# Patient Record
Sex: Male | Born: 1935
Health system: Southern US, Community
[De-identification: ages and names within clinical notes are randomized; demographics above are authoritative.]

## PROBLEM LIST (undated history)

## (undated) DIAGNOSIS — C801 Malignant (primary) neoplasm, unspecified: Secondary | ICD-10-CM

## (undated) DIAGNOSIS — A319 Mycobacterial infection, unspecified: Secondary | ICD-10-CM

## (undated) DIAGNOSIS — R112 Nausea with vomiting, unspecified: Secondary | ICD-10-CM

## (undated) DIAGNOSIS — M1990 Unspecified osteoarthritis, unspecified site: Secondary | ICD-10-CM

## (undated) DIAGNOSIS — K219 Gastro-esophageal reflux disease without esophagitis: Secondary | ICD-10-CM

## (undated) DIAGNOSIS — B029 Zoster without complications: Secondary | ICD-10-CM

## (undated) DIAGNOSIS — Z9889 Other specified postprocedural states: Secondary | ICD-10-CM

## (undated) DIAGNOSIS — A310 Pulmonary mycobacterial infection: Secondary | ICD-10-CM

## (undated) DIAGNOSIS — E039 Hypothyroidism, unspecified: Secondary | ICD-10-CM

## (undated) DIAGNOSIS — Z973 Presence of spectacles and contact lenses: Secondary | ICD-10-CM

## (undated) DIAGNOSIS — I5022 Chronic systolic (congestive) heart failure: Secondary | ICD-10-CM

## (undated) DIAGNOSIS — R918 Other nonspecific abnormal finding of lung field: Secondary | ICD-10-CM

## (undated) HISTORY — DX: Malignant (primary) neoplasm, unspecified: C80.1

## (undated) HISTORY — PX: COLONOSCOPY: SHX174

## (undated) HISTORY — PX: CATARACT EXTRACTION W/ INTRAOCULAR LENS  IMPLANT, BILATERAL: SHX1307

## (undated) HISTORY — DX: Other nonspecific abnormal finding of lung field: R91.8

## (undated) HISTORY — DX: Mycobacterial infection, unspecified: A31.9

## (undated) HISTORY — DX: Chronic systolic (congestive) heart failure: I50.22

## (undated) HISTORY — PX: APPENDECTOMY: SHX54

## (undated) HISTORY — PX: INGUINAL HERNIA REPAIR: SUR1180

---

## 2001-11-15 HISTORY — PX: THYROIDECTOMY: SHX17

## 2002-09-05 ENCOUNTER — Encounter: Admission: RE | Admit: 2002-09-05 | Discharge: 2002-09-05 | Payer: Self-pay | Admitting: General Surgery

## 2002-09-05 ENCOUNTER — Encounter: Payer: Self-pay | Admitting: General Surgery

## 2002-09-06 ENCOUNTER — Ambulatory Visit (HOSPITAL_BASED_OUTPATIENT_CLINIC_OR_DEPARTMENT_OTHER): Admission: RE | Admit: 2002-09-06 | Discharge: 2002-09-06 | Payer: Self-pay | Admitting: General Surgery

## 2002-10-01 ENCOUNTER — Encounter: Payer: Self-pay | Admitting: General Surgery

## 2002-10-01 ENCOUNTER — Encounter: Admission: RE | Admit: 2002-10-01 | Discharge: 2002-10-01 | Payer: Self-pay | Admitting: General Surgery

## 2002-10-15 ENCOUNTER — Encounter: Payer: Self-pay | Admitting: General Surgery

## 2002-10-15 ENCOUNTER — Ambulatory Visit (HOSPITAL_COMMUNITY): Admission: RE | Admit: 2002-10-15 | Discharge: 2002-10-15 | Payer: Self-pay | Admitting: General Surgery

## 2002-10-17 ENCOUNTER — Ambulatory Visit (HOSPITAL_COMMUNITY): Admission: RE | Admit: 2002-10-17 | Discharge: 2002-10-17 | Payer: Self-pay | Admitting: General Surgery

## 2002-10-17 ENCOUNTER — Encounter: Payer: Self-pay | Admitting: General Surgery

## 2002-11-19 ENCOUNTER — Inpatient Hospital Stay (HOSPITAL_COMMUNITY): Admission: RE | Admit: 2002-11-19 | Discharge: 2002-11-21 | Payer: Self-pay | Admitting: General Surgery

## 2002-12-31 ENCOUNTER — Encounter (HOSPITAL_COMMUNITY): Admission: RE | Admit: 2002-12-31 | Discharge: 2003-03-31 | Payer: Self-pay | Admitting: Endocrinology

## 2003-01-03 ENCOUNTER — Encounter: Payer: Self-pay | Admitting: Endocrinology

## 2003-01-10 ENCOUNTER — Encounter: Payer: Self-pay | Admitting: Endocrinology

## 2005-02-08 ENCOUNTER — Ambulatory Visit (HOSPITAL_COMMUNITY): Admission: RE | Admit: 2005-02-08 | Discharge: 2005-02-08 | Payer: Self-pay | Admitting: *Deleted

## 2014-04-05 ENCOUNTER — Other Ambulatory Visit: Payer: Self-pay | Admitting: Orthopedic Surgery

## 2014-04-05 DIAGNOSIS — R229 Localized swelling, mass and lump, unspecified: Principal | ICD-10-CM

## 2014-04-05 DIAGNOSIS — IMO0002 Reserved for concepts with insufficient information to code with codable children: Secondary | ICD-10-CM

## 2014-04-05 DIAGNOSIS — D492 Neoplasm of unspecified behavior of bone, soft tissue, and skin: Secondary | ICD-10-CM

## 2014-04-12 ENCOUNTER — Other Ambulatory Visit: Payer: Self-pay | Admitting: Orthopedic Surgery

## 2014-04-12 DIAGNOSIS — Z139 Encounter for screening, unspecified: Secondary | ICD-10-CM

## 2014-04-15 ENCOUNTER — Ambulatory Visit
Admission: RE | Admit: 2014-04-15 | Discharge: 2014-04-15 | Disposition: A | Discharge status: Home / Self Care | Payer: Medicare Other | Source: Ambulatory Visit | Attending: Orthopedic Surgery | Admitting: Orthopedic Surgery

## 2014-04-15 DIAGNOSIS — D492 Neoplasm of unspecified behavior of bone, soft tissue, and skin: Secondary | ICD-10-CM

## 2014-04-15 DIAGNOSIS — IMO0002 Reserved for concepts with insufficient information to code with codable children: Secondary | ICD-10-CM

## 2014-04-15 DIAGNOSIS — R229 Localized swelling, mass and lump, unspecified: Principal | ICD-10-CM

## 2014-04-15 DIAGNOSIS — Z139 Encounter for screening, unspecified: Secondary | ICD-10-CM

## 2014-04-15 MED ORDER — GADOBENATE DIMEGLUMINE 529 MG/ML IV SOLN
15.0000 mL | Freq: Once | INTRAVENOUS | Status: AC | PRN
  Administered 2014-04-15: 15 mL via INTRAVENOUS

## 2014-08-19 ENCOUNTER — Other Ambulatory Visit: Payer: Self-pay | Admitting: Orthopedic Surgery

## 2014-08-22 ENCOUNTER — Encounter (HOSPITAL_BASED_OUTPATIENT_CLINIC_OR_DEPARTMENT_OTHER): Payer: Self-pay | Admitting: *Deleted

## 2014-08-22 NOTE — Progress Notes (Signed)
No labs needed

## 2014-08-27 ENCOUNTER — Encounter (HOSPITAL_BASED_OUTPATIENT_CLINIC_OR_DEPARTMENT_OTHER): Payer: Medicare Other | Admitting: Anesthesiology

## 2014-08-27 ENCOUNTER — Ambulatory Visit (HOSPITAL_BASED_OUTPATIENT_CLINIC_OR_DEPARTMENT_OTHER): Payer: Medicare Other | Admitting: Anesthesiology

## 2014-08-27 ENCOUNTER — Encounter (HOSPITAL_BASED_OUTPATIENT_CLINIC_OR_DEPARTMENT_OTHER)
Admission: RE | Disposition: A | Discharge status: Home / Self Care | Payer: Self-pay | Source: Ambulatory Visit | Attending: Orthopedic Surgery

## 2014-08-27 ENCOUNTER — Encounter (HOSPITAL_BASED_OUTPATIENT_CLINIC_OR_DEPARTMENT_OTHER): Payer: Self-pay | Admitting: *Deleted

## 2014-08-27 ENCOUNTER — Ambulatory Visit (HOSPITAL_BASED_OUTPATIENT_CLINIC_OR_DEPARTMENT_OTHER)
Admission: RE | Admit: 2014-08-27 | Discharge: 2014-08-27 | Disposition: A | Discharge status: Home / Self Care | Payer: Medicare Other | Source: Ambulatory Visit | Attending: Orthopedic Surgery | Admitting: Orthopedic Surgery

## 2014-08-27 DIAGNOSIS — D2111 Benign neoplasm of connective and other soft tissue of right upper limb, including shoulder: Secondary | ICD-10-CM | POA: Insufficient documentation

## 2014-08-27 DIAGNOSIS — E039 Hypothyroidism, unspecified: Secondary | ICD-10-CM | POA: Insufficient documentation

## 2014-08-27 DIAGNOSIS — M1990 Unspecified osteoarthritis, unspecified site: Secondary | ICD-10-CM | POA: Diagnosis not present

## 2014-08-27 DIAGNOSIS — R229 Localized swelling, mass and lump, unspecified: Secondary | ICD-10-CM | POA: Diagnosis present

## 2014-08-27 HISTORY — DX: Other specified postprocedural states: Z98.890

## 2014-08-27 HISTORY — PX: MASS EXCISION: SHX2000

## 2014-08-27 HISTORY — DX: Unspecified osteoarthritis, unspecified site: M19.90

## 2014-08-27 HISTORY — DX: Hypothyroidism, unspecified: E03.9

## 2014-08-27 HISTORY — DX: Other specified postprocedural states: R11.2

## 2014-08-27 HISTORY — DX: Presence of spectacles and contact lenses: Z97.3

## 2014-08-27 LAB — POCT HEMOGLOBIN-HEMACUE: HEMOGLOBIN: 14.1 g/dL (ref 13.0–17.0)

## 2014-08-27 SURGERY — EXCISION MASS
Anesthesia: Regional | Site: Hand | Laterality: Right

## 2014-08-27 MED ORDER — BUPIVACAINE HCL (PF) 0.25 % IJ SOLN
INTRAMUSCULAR | Status: AC
  Filled 2014-08-27: qty 30

## 2014-08-27 MED ORDER — OXYCODONE HCL 5 MG PO TABS
5.0000 mg | ORAL_TABLET | Freq: Once | ORAL | Status: AC | PRN
  Administered 2014-08-27: 5 mg via ORAL

## 2014-08-27 MED ORDER — CEFAZOLIN SODIUM-DEXTROSE 2-3 GM-% IV SOLR
2.0000 g | INTRAVENOUS | Status: AC
  Administered 2014-08-27: 2 g via INTRAVENOUS

## 2014-08-27 MED ORDER — EPHEDRINE SULFATE 50 MG/ML IJ SOLN
INTRAMUSCULAR | Status: DC | PRN
  Administered 2014-08-27: 10 mg via INTRAVENOUS

## 2014-08-27 MED ORDER — MIDAZOLAM HCL 2 MG/2ML IJ SOLN
INTRAMUSCULAR | Status: AC
  Filled 2014-08-27: qty 2

## 2014-08-27 MED ORDER — DEXAMETHASONE SODIUM PHOSPHATE 10 MG/ML IJ SOLN
INTRAMUSCULAR | Status: DC | PRN
  Administered 2014-08-27: 4 mg via INTRAVENOUS

## 2014-08-27 MED ORDER — FENTANYL CITRATE 0.05 MG/ML IJ SOLN
INTRAMUSCULAR | Status: DC | PRN
  Administered 2014-08-27: 25 ug via INTRAVENOUS

## 2014-08-27 MED ORDER — OXYCODONE HCL 5 MG PO TABS
ORAL_TABLET | ORAL | Status: AC
  Filled 2014-08-27: qty 1

## 2014-08-27 MED ORDER — PROPOFOL 10 MG/ML IV BOLUS
INTRAVENOUS | Status: DC | PRN
  Administered 2014-08-27 (×2): 50 mg via INTRAVENOUS
  Administered 2014-08-27: 200 mg via INTRAVENOUS

## 2014-08-27 MED ORDER — CEFAZOLIN SODIUM-DEXTROSE 2-3 GM-% IV SOLR
INTRAVENOUS | Status: AC
  Filled 2014-08-27: qty 50

## 2014-08-27 MED ORDER — FENTANYL CITRATE 0.05 MG/ML IJ SOLN
INTRAMUSCULAR | Status: AC
  Filled 2014-08-27: qty 2

## 2014-08-27 MED ORDER — ONDANSETRON HCL 4 MG/2ML IJ SOLN
INTRAMUSCULAR | Status: DC | PRN
  Administered 2014-08-27: 4 mg via INTRAVENOUS

## 2014-08-27 MED ORDER — OXYCODONE HCL 5 MG/5ML PO SOLN: 5.0000 mg | Freq: Once | ORAL | Status: AC | PRN

## 2014-08-27 MED ORDER — FENTANYL CITRATE 0.05 MG/ML IJ SOLN
50.0000 ug | INTRAMUSCULAR | Status: DC | PRN
  Administered 2014-08-27: 50 ug via INTRAVENOUS

## 2014-08-27 MED ORDER — BUPIVACAINE-EPINEPHRINE (PF) 0.5% -1:200000 IJ SOLN
INTRAMUSCULAR | Status: DC | PRN
  Administered 2014-08-27: 30 mL via PERINEURAL

## 2014-08-27 MED ORDER — CHLORHEXIDINE GLUCONATE 4 % EX LIQD: 60.0000 mL | Freq: Once | CUTANEOUS | Status: DC

## 2014-08-27 MED ORDER — FENTANYL CITRATE 0.05 MG/ML IJ SOLN
INTRAMUSCULAR | Status: AC
  Filled 2014-08-27: qty 4

## 2014-08-27 MED ORDER — HYDROCODONE-ACETAMINOPHEN 5-325 MG PO TABS: ORAL_TABLET | ORAL | Status: DC

## 2014-08-27 MED ORDER — FENTANYL CITRATE 0.05 MG/ML IJ SOLN: 25.0000 ug | INTRAMUSCULAR | Status: DC | PRN

## 2014-08-27 MED ORDER — MIDAZOLAM HCL 2 MG/2ML IJ SOLN: 1.0000 mg | INTRAMUSCULAR | Status: DC | PRN

## 2014-08-27 MED ORDER — LACTATED RINGERS IV SOLN
INTRAVENOUS | Status: DC
  Administered 2014-08-27: 10 mL/h via INTRAVENOUS
  Administered 2014-08-27: 12:00:00 via INTRAVENOUS

## 2014-08-27 MED ORDER — ONDANSETRON HCL 4 MG/2ML IJ SOLN: 4.0000 mg | Freq: Four times a day (QID) | INTRAMUSCULAR | Status: DC | PRN

## 2014-08-27 SURGICAL SUPPLY — 61 items
BANDAGE COBAN STERILE 2 (GAUZE/BANDAGES/DRESSINGS) IMPLANT
BANDAGE ELASTIC 3 VELCRO ST LF (GAUZE/BANDAGES/DRESSINGS) ×3 IMPLANT
BENZOIN TINCTURE PRP APPL 2/3 (GAUZE/BANDAGES/DRESSINGS) IMPLANT
BLADE MINI RND TIP GREEN BEAV (BLADE) IMPLANT
BLADE SURG 15 STRL LF DISP TIS (BLADE) ×2 IMPLANT
BLADE SURG 15 STRL SS (BLADE) ×4
BNDG COHESIVE 1X5 TAN STRL LF (GAUZE/BANDAGES/DRESSINGS) IMPLANT
BNDG CONFORM 2 STRL LF (GAUZE/BANDAGES/DRESSINGS) IMPLANT
BNDG ELASTIC 2 VLCR STRL LF (GAUZE/BANDAGES/DRESSINGS) IMPLANT
BNDG ESMARK 4X9 LF (GAUZE/BANDAGES/DRESSINGS) ×3 IMPLANT
BNDG GAUZE 1X2.1 STRL (MISCELLANEOUS) IMPLANT
BNDG GAUZE ELAST 4 BULKY (GAUZE/BANDAGES/DRESSINGS) ×3 IMPLANT
BNDG PLASTER X FAST 3X3 WHT LF (CAST SUPPLIES) ×15 IMPLANT
CHLORAPREP W/TINT 26ML (MISCELLANEOUS) ×3 IMPLANT
CLOSURE WOUND 1/2 X4 (GAUZE/BANDAGES/DRESSINGS)
CORDS BIPOLAR (ELECTRODE) ×3 IMPLANT
COVER BACK TABLE 60X90IN (DRAPES) ×3 IMPLANT
COVER MAYO STAND STRL (DRAPES) ×3 IMPLANT
CUFF TOURNIQUET SINGLE 18IN (TOURNIQUET CUFF) ×3 IMPLANT
DRAPE EXTREMITY TIBURON (DRAPES) ×3 IMPLANT
DRAPE SURG 17X23 STRL (DRAPES) ×3 IMPLANT
GAUZE SPONGE 4X4 12PLY STRL (GAUZE/BANDAGES/DRESSINGS) ×3 IMPLANT
GAUZE XEROFORM 1X8 LF (GAUZE/BANDAGES/DRESSINGS) ×3 IMPLANT
GLOVE BIO SURGEON STRL SZ7.5 (GLOVE) ×3 IMPLANT
GLOVE BIOGEL PI IND STRL 7.0 (GLOVE) ×1 IMPLANT
GLOVE BIOGEL PI IND STRL 8 (GLOVE) ×1 IMPLANT
GLOVE BIOGEL PI IND STRL 8.5 (GLOVE) ×1 IMPLANT
GLOVE BIOGEL PI INDICATOR 7.0 (GLOVE) ×2
GLOVE BIOGEL PI INDICATOR 8 (GLOVE) ×2
GLOVE BIOGEL PI INDICATOR 8.5 (GLOVE) ×2
GLOVE ECLIPSE 6.5 STRL STRAW (GLOVE) ×3 IMPLANT
GLOVE EXAM NITRILE LRG STRL (GLOVE) ×3 IMPLANT
GLOVE SURG ORTHO 8.0 STRL STRW (GLOVE) ×3 IMPLANT
GOWN STRL REUS W/ TWL LRG LVL3 (GOWN DISPOSABLE) ×1 IMPLANT
GOWN STRL REUS W/TWL LRG LVL3 (GOWN DISPOSABLE) ×2
GOWN STRL REUS W/TWL XL LVL3 (GOWN DISPOSABLE) ×6 IMPLANT
NEEDLE HYPO 25X1 1.5 SAFETY (NEEDLE) IMPLANT
NS IRRIG 1000ML POUR BTL (IV SOLUTION) ×3 IMPLANT
PACK BASIN DAY SURGERY FS (CUSTOM PROCEDURE TRAY) ×3 IMPLANT
PAD CAST 3X4 CTTN HI CHSV (CAST SUPPLIES) ×1 IMPLANT
PAD CAST 4YDX4 CTTN HI CHSV (CAST SUPPLIES) IMPLANT
PADDING CAST ABS 4INX4YD NS (CAST SUPPLIES)
PADDING CAST ABS COTTON 4X4 ST (CAST SUPPLIES) IMPLANT
PADDING CAST COTTON 3X4 STRL (CAST SUPPLIES) ×2
PADDING CAST COTTON 4X4 STRL (CAST SUPPLIES)
SLEEVE SCD COMPRESS KNEE MED (MISCELLANEOUS) ×3 IMPLANT
SLING ARM LRG ADULT FOAM STRAP (SOFTGOODS) ×3 IMPLANT
STOCKINETTE 4X48 STRL (DRAPES) ×3 IMPLANT
STRIP CLOSURE SKIN 1/2X4 (GAUZE/BANDAGES/DRESSINGS) IMPLANT
SUT ETHILON 3 0 PS 1 (SUTURE) IMPLANT
SUT ETHILON 4 0 PS 2 18 (SUTURE) ×3 IMPLANT
SUT ETHILON 5 0 P 3 18 (SUTURE)
SUT NYLON ETHILON 5-0 P-3 1X18 (SUTURE) IMPLANT
SUT VIC AB 4-0 P2 18 (SUTURE) IMPLANT
SUT VICRYL 4-0 PS2 18IN ABS (SUTURE) ×3 IMPLANT
SWAB COLLECTION DEVICE MRSA (MISCELLANEOUS) ×3 IMPLANT
SYR BULB 3OZ (MISCELLANEOUS) ×3 IMPLANT
SYR CONTROL 10ML LL (SYRINGE) IMPLANT
TOWEL OR 17X24 6PK STRL BLUE (TOWEL DISPOSABLE) ×3 IMPLANT
TUBE ANAEROBIC SPECIMEN COL (MISCELLANEOUS) ×3 IMPLANT
UNDERPAD 30X30 INCONTINENT (UNDERPADS AND DIAPERS) IMPLANT

## 2014-08-27 NOTE — Progress Notes (Signed)
Assisted Dr. Hodierne with right, ultrasound guided, infraclavicular block. Side rails up, monitors on throughout procedure. See vital signs in flow sheet. Tolerated Procedure well. 

## 2014-08-27 NOTE — Anesthesia Procedure Notes (Addendum)
Anesthesia Regional Block:  Infraclavicular brachial plexus block  Pre-Anesthetic Checklist: ,, timeout performed, Correct Patient, Correct Site, Correct Laterality, Correct Procedure, Correct Position, site marked, Risks and benefits discussed,  Surgical consent,  Pre-op evaluation,  At surgeon's request and post-op pain management  Laterality: Right  Prep: chloraprep       Needles:  Injection technique: Single-shot  Needle Type: Echogenic Stimulator Needle     Needle Length: 9cm 9 cm Needle Gauge: 21 and 21 G    Additional Needles:  Procedures: ultrasound guided (picture in chart) and nerve stimulator Infraclavicular brachial plexus block  Nerve Stimulator or Paresthesia:  Response: biceps flexion, 0.45 mA,   Additional Responses:   Narrative:  Start time: 08/27/2014 11:57 AM End time: 08/27/2014 12:12 PM Injection made incrementally with aspirations every 5 mL.  Performed by: Personally  Anesthesiologist: Dr Marcie Bal  Additional Notes: Functioning IV was confirmed and monitors were applied.  A 60mm 21ga Arrow echogenic stimulator needle was used. Sterile prep and drape,hand hygiene and sterile gloves were used.  Negative aspiration and negative test dose prior to incremental administration of local anesthetic. The patient tolerated the procedure well.  Ultrasound guidance: relevent anatomy identified, needle position confirmed, local anesthetic spread visualized around nerve(s), vascular puncture avoided.  Image printed for medical record.    Procedure Name: LMA Insertion Date/Time: 08/27/2014 12:38 PM Performed by: Maryella Shivers Pre-anesthesia Checklist: Patient identified, Emergency Drugs available, Suction available and Patient being monitored Patient Re-evaluated:Patient Re-evaluated prior to inductionOxygen Delivery Method: Circle System Utilized Preoxygenation: Pre-oxygenation with 100% oxygen Intubation Type: IV induction Ventilation: Mask ventilation  without difficulty LMA: LMA inserted LMA Size: 5.0 Number of attempts: 1 Airway Equipment and Method: bite block Placement Confirmation: positive ETCO2 Tube secured with: Tape Dental Injury: Teeth and Oropharynx as per pre-operative assessment

## 2014-08-27 NOTE — Transfer of Care (Signed)
Immediate Anesthesia Transfer of Care Note  Patient: Curtis Ramos  Procedure(s) Performed: Procedure(s): EXCISION MASS RIGHT PALM/INDEX (Right)  Patient Location: PACU  Anesthesia Type:GA combined with regional for post-op pain  Level of Consciousness: sedated  Airway & Oxygen Therapy: Patient Spontanous Breathing and Patient connected to face mask oxygen  Post-op Assessment: Report given to PACU RN and Post -op Vital signs reviewed and stable  Post vital signs: Reviewed and stable  Complications: No apparent anesthesia complications

## 2014-08-27 NOTE — H&P (Signed)
  Curtis Ramos is an 80 y.o. male.   Chief Complaint: right index finger mass HPI: 80 yo rhd male with mass at base right index finger x 1 year.  Minimal pain but aggravated by mass in palm of his hand.  He wishes to have it removed.  Past Medical History  Diagnosis Date  . Hypothyroidism   . Arthritis   . Wears glasses     Past Surgical History  Procedure Laterality Date  . Thyroidectomy  2003  . Appendectomy    . Inguinal hernia repair      left  . Colonoscopy      History reviewed. No pertinent family history. Social History:  reports that he has never smoked. He does not have any smokeless tobacco history on file. He reports that he does not drink alcohol or use illicit drugs.  Allergies: No Known Allergies  No prescriptions prior to admission    No results found for this or any previous visit (from the past 48 hour(s)).  No results found.   A comprehensive review of systems was negative except for: Eyes: positive for cataracts and contacts/glasses  Height 5\' 11"  (1.803 m), weight 79.379 kg (175 lb).  General appearance: alert, cooperative and appears stated age Head: Normocephalic, without obvious abnormality, atraumatic Neck: supple, symmetrical, trachea midline Resp: clear to auscultation bilaterally Cardio: regular rate and rhythm GI: non tedner Extremities: intact sensation and capillary refill all digits.  +epl/fpl/io.  mass volar palm at base of index finger.  no skin changes. Pulses: 2+ and symmetric Skin: Skin color, texture, turgor normal. No rashes or lesions Neurologic: Grossly normal Incision/Wound: none  Assessment/Plan Right palm mass.  Possible tenosynovitis vs giant cell tumor.  Non operative and operative treatment options were discussed with the patient and patient wishes to proceed with operative treatment. He wishes to have it removed.  Risks, benefits, and alternatives of surgery were discussed and the patient agrees with the plan of  care.   Shatia Sindoni R 08/27/2014, 9:54 AM

## 2014-08-27 NOTE — Op Note (Signed)
[id number] 

## 2014-08-27 NOTE — Op Note (Signed)
NAMEMANSUR, PATTI                 ACCOUNT NO.:  [id number]  MEDICAL RECORD NO.:  [id number]  LOCATION:                                 FACILITY:  PHYSICIAN:  Leanora Cover, MD             DATE OF BIRTH:  DATE OF PROCEDURE:  08/27/2014 DATE OF DISCHARGE:                              OPERATIVE REPORT   PREOPERATIVE DIAGNOSIS:  Right palm and index finger mass.  POSTOPERATIVE DIAGNOSIS:  Right palm and index finger mass.  PROCEDURE:  Excision of mass right palm and index finger, greater than 1.5 cm.  SURGEON:  Leanora Cover, MD.  ASSISTANT:  Daryll Brod, MD.  ANESTHESIA:  General with regional.  IV FLUIDS:  Per anesthesia flow sheet.  ESTIMATED BLOOD LOSS:  Minimal.  COMPLICATIONS:  None.  SPECIMENS:  Tissue to pathology and cultures to micro.  TOURNIQUET TIME:  49 minutes.  DISPOSITION:  Stable to PACU.  INDICATIONS:  Mr. Catanzaro is an 81 year old right-hand dominant male who has noted a mass in the palm and index finger of the right hand, this is bothersome to him.  He wished to have it excised.  I discussed with Mr. Enberg the nature of the condition.  Risks, benefits, and alternatives of the surgery were discussed including risk of blood loss; infection; damage to nerves, vessels, tendons, ligaments, bone; failure of surgery; need for additional surgery, complications with wound healing; continued pain; and recurrence of the mass.  He voiced understanding and elected to proceed.  OPERATIVE COURSE:  After being identified preoperatively by myself, the patient and I agreed upon procedure and site of the procedure.  Surgical site was marked.  The risks, benefits, and alternatives of surgery were reviewed and wished to proceed.  Surgical consent had been signed.  He was given a regional block by Anesthesia in preoperative holding.  He was given IV Ancef as preoperative antibiotic prophylaxis.  He was transported to the operating room, placed on the operating table  supine position with the right upper extremity on arm board.  General anesthesia was induced by anesthesiologist.  The right upper extremity was prepped and draped in normal sterile orthopedic fashion.  Surgical pause was performed between surgeons, anesthesia, operating staff, and all were in agreement with the patient, procedure, and site of the procedure.  Tourniquet at the proximal aspect of the extremity was inflated to 250 mmHg.  After exsanguination of the limb with Esmarch bandage, Brunner type incision was made coursing from the PIP joint into the palm.  This was carried into subcutaneous tissues by spreading technique.  The mass was identified.  It was adherent to surrounding tissues, it was poorly defined.  It surrounded the FDP and FDS tendons. These were running through separate pathways in the mass proximally. The mass was significantly adherent to the tendons distally.  It was removed as best possible.  The combination of Freer elevator, scissors, and rongeur were used to remove the mass.  The A2 and A3 pulleys had been stretched by the mass.  The A2 pulley had been incised to aid in removal of the mass.  The radial and ulnar digital neurovascular  bundles were identified and protected throughout the case.  The mass was removed and sent to pathology for examination.  Cultures were taken and sent to micro for aerobes, anaerobes, AFB, and fungus.  There was synovial fluid surrounding the mass.  It had a multilobulated type appearance with whitish colored lobules.  The mass was inhomogeneous.  The wound was copiously irrigated with sterile saline.  The A2 pulley was repaired with a 4-0 Vicryl suture in a figure-of-eight fashion.  The skin was closed with 4-0 nylon in a horizontal mattress fashion.  The wound was dressed with sterile Xeroform, 4x4s, and wrapped with a Kerlix bandage. A dorsal blocking splint after the MP joints was placed and wrapped with Kerlix and Ace bandage.   Tourniquet was deflated at 49 minutes. Fingertips were pink with brisk capillary refill after deflation of tourniquet.  The operative drapes were broken down.  The patient was awoken from anesthesia safely.  He was transferred back to stretcher and taken to PACU in stable condition.  I will see him back in the office in 1 week for postoperative followup.  I will give him Norco 5/325 one to two p.o. q.6 hours p.r.n. pain, dispensed #40.     Leanora Cover, MD     KK/MEDQ  D:  08/27/2014  T:  08/27/2014  Job:  [id number]

## 2014-08-27 NOTE — Anesthesia Postprocedure Evaluation (Signed)
Anesthesia Post Note  Patient: Curtis Ramos  Procedure(s) Performed: Procedure(s) (LRB): EXCISION MASS RIGHT PALM/INDEX (Right)  Anesthesia type: General  Patient location: PACU  Post pain: Pain level controlled and Adequate analgesia  Post assessment: Post-op Vital signs reviewed, Patient's Cardiovascular Status Stable, Respiratory Function Stable, Patent Airway and Pain level controlled  Last Vitals:  Filed Vitals:   08/27/14 1500  BP: 156/75  Pulse: 71  Temp: 36.7 C  Resp: 16    Post vital signs: Reviewed and stable  Level of consciousness: awake, alert  and oriented  Complications: No apparent anesthesia complications

## 2014-08-27 NOTE — Brief Op Note (Signed)
08/27/2014  1:36 PM  PATIENT:  Curtis Ramos  80 y.o. male  PRE-OPERATIVE DIAGNOSIS:  RIGHT INDEX/PALM MASS/239.2  POST-OPERATIVE DIAGNOSIS:  RIGHT INDEX/PALM MASS/239.2  PROCEDURE:  Procedure(s): EXCISION MASS RIGHT PALM/INDEX (Right)  SURGEON:  Surgeon(s) and Role:    * Leanora Cover, MD - Primary    * Daryll Brod, MD - Assisting  PHYSICIAN ASSISTANT:   ASSISTANTS: Daryll Brod, MD   ANESTHESIA:   regional and general  EBL:  Total I/O In: 700 [I.V.:700] Out: -   BLOOD ADMINISTERED:none  DRAINS: none   LOCAL MEDICATIONS USED:  NONE  SPECIMEN:  Source of Specimen:  right palm  DISPOSITION OF SPECIMEN:  PATHOLOGY, cultures to micro  COUNTS:  YES  TOURNIQUET:   Total Tourniquet Time Documented: Upper Arm (Right) - 49 minutes Total: Upper Arm (Right) - 49 minutes   DICTATION: .Other Dictation: Dictation Number [id number]  PLAN OF CARE: Discharge to home after PACU  PATIENT DISPOSITION:  PACU - hemodynamically stable.

## 2014-08-27 NOTE — Anesthesia Preprocedure Evaluation (Addendum)
Anesthesia Evaluation  Patient identified by MRN, date of birth, ID band Patient awake    Reviewed: Allergy & Precautions, H&P , NPO status , Patient's Chart, lab work & pertinent test results  History of Anesthesia Complications (+) PONV and history of anesthetic complications  Airway Mallampati: II  Neck ROM: full    Dental   Pulmonary neg pulmonary ROS,          Cardiovascular negative cardio ROS      Neuro/Psych    GI/Hepatic   Endo/Other  Hypothyroidism   Renal/GU      Musculoskeletal  (+) Arthritis -,   Abdominal   Peds  Hematology   Anesthesia Other Findings   Reproductive/Obstetrics                           Anesthesia Physical Anesthesia Plan  ASA: II  Anesthesia Plan: MAC and Regional   Post-op Pain Management: MAC Combined w/ Regional for Post-op pain   Induction: Intravenous  Airway Management Planned: Simple Face Mask  Additional Equipment:   Intra-op Plan:   Post-operative Plan:   Informed Consent: I have reviewed the patients History and Physical, chart, labs and discussed the procedure including the risks, benefits and alternatives for the proposed anesthesia with the patient or authorized representative who has indicated his/her understanding and acceptance.     Plan Discussed with: CRNA, Anesthesiologist and Surgeon  Anesthesia Plan Comments:        Anesthesia Quick Evaluation

## 2014-08-27 NOTE — Discharge Instructions (Addendum)
[facility] Instructions °Hand Surgery ° °Wound Care: °Keep your hand elevated above the level of your heart.  Do not allow it to dangle by your side.  Keep the dressing dry and do not remove it unless your doctor advises you to do so.  He will usually change it at the time of your post-op visit.  Moving your fingers is advised to stimulate circulation but will depend on the site of your surgery.  If you have a splint applied, your doctor will advise you regarding movement. ° °Activity: °Do not drive or operate machinery today.  Rest today and then you may return to your normal activity and work as indicated by your physician. ° °Diet:  °Drink liquids today or eat a light diet.  You may resume a regular diet tomorrow.   ° °General expectations: °Pain for two to three days. °Fingers may become slightly swollen. ° °Call your doctor if any of the following occur: °Severe pain not relieved by pain medication. °Elevated temperature. °Dressing soaked with blood. °Inability to move fingers. °White or bluish color to fingers. ° ° °Regional Anesthesia Blocks ° °1. Numbness or the inability to move the "blocked" extremity may last from 3-48 hours after placement. The length of time depends on the medication injected and your individual response to the medication. If the numbness is not going away after 48 hours, call your surgeon. ° °2. The extremity that is blocked will need to be protected until the numbness is gone and the  Strength has returned. Because you cannot feel it, you will need to take extra care to avoid injury. Because it may be weak, you may have difficulty moving it or using it. You may not know what position it is in without looking at it while the block is in effect. ° °3. For blocks in the legs and feet, returning to weight bearing and walking needs to be done carefully. You will need to wait until the numbness is entirely gone and the strength has returned. You should be able to move your leg and foot  normally before you try and bear weight or walk. You will need someone to be with you when you first try to ensure you do not fall and possibly risk injury. ° °4. Bruising and tenderness at the needle site are common side effects and will resolve in a few days. ° °5. Persistent numbness or new problems with movement should be communicated to the surgeon or the [facility] ([phone])/ [facility] ([phone]). ° ° °Post Anesthesia Home Care Instructions ° °Activity: °Get plenty of rest for the remainder of the day. A responsible adult should stay with you for 24 hours following the procedure.  °For the next 24 hours, DO NOT: °-Drive a car °-Operate machinery °-Drink alcoholic beverages °-Take any medication unless instructed by your physician °-Make any legal decisions or sign important papers. ° °Meals: °Start with liquid foods such as gelatin or soup. Progress to regular foods as tolerated. Avoid greasy, spicy, heavy foods. If nausea and/or vomiting occur, drink only clear liquids until the nausea and/or vomiting subsides. Call your physician if vomiting continues. ° °Special Instructions/Symptoms: °Your throat may feel dry or sore from the anesthesia or the breathing tube placed in your throat during surgery. If this causes discomfort, gargle with warm salt water. The discomfort should disappear within 24 hours. ° °

## 2014-08-28 ENCOUNTER — Encounter (HOSPITAL_BASED_OUTPATIENT_CLINIC_OR_DEPARTMENT_OTHER): Payer: Self-pay | Admitting: Orthopedic Surgery

## 2014-08-30 LAB — WOUND CULTURE: CULTURE: NO GROWTH

## 2014-09-01 LAB — ANAEROBIC CULTURE

## 2014-09-24 DIAGNOSIS — A319 Mycobacterial infection, unspecified: Secondary | ICD-10-CM | POA: Insufficient documentation

## 2014-09-24 LAB — FUNGUS CULTURE W SMEAR: FUNGAL SMEAR: NONE SEEN

## 2014-10-07 ENCOUNTER — Encounter: Payer: Self-pay | Admitting: Internal Medicine

## 2014-10-07 ENCOUNTER — Ambulatory Visit (INDEPENDENT_AMBULATORY_CARE_PROVIDER_SITE_OTHER): Payer: Medicare Other | Admitting: Internal Medicine

## 2014-10-07 VITALS — BP 127/74 | HR 76 | Temp 97.7°F | Ht 71.0 in | Wt 173.5 lb

## 2014-10-07 DIAGNOSIS — E890 Postprocedural hypothyroidism: Secondary | ICD-10-CM

## 2014-10-07 DIAGNOSIS — M159 Polyosteoarthritis, unspecified: Secondary | ICD-10-CM

## 2014-10-07 DIAGNOSIS — M150 Primary generalized (osteo)arthritis: Secondary | ICD-10-CM

## 2014-10-07 DIAGNOSIS — M1990 Unspecified osteoarthritis, unspecified site: Secondary | ICD-10-CM

## 2014-10-07 HISTORY — DX: Unspecified osteoarthritis, unspecified site: M19.90

## 2014-10-07 HISTORY — DX: Postprocedural hypothyroidism: E89.0

## 2014-10-07 NOTE — Progress Notes (Addendum)
Patient ID: Curtis Ramos, male   DOB: 1932/01/01, 80 y.o.   MRN: [id number]         [facility] for Infectious Disease  Reason for Consult: Curtis Ramos tenosynovitis of right hand Referring Physician: Dr. Leanora Ramos  Patient Active Problem List   Diagnosis Date Noted  . Hypothyroidism associated with surgical procedure 10/07/2014  . DJD (degenerative joint disease) 10/07/2014  . Atypical Curtis infection     Patient's Medications  New Prescriptions   No medications on file  Previous Medications   COD LIVER OIL 1000 MG CAPS    Take by mouth.   LEVOTHYROXINE (SYNTHROID, LEVOTHROID) 150 MCG TABLET    Take 150 mcg by mouth daily before breakfast. Alternated 175,150 every other day   MULTIPLE VITAMINS-MINERALS (MULTIVITAMIN WITH MINERALS) TABLET    Take 1 tablet by mouth daily.   ZINC GLUCONATE 50 MG TABLET    Take 50 mg by mouth daily.  Modified Medications   No medications on file  Discontinued Medications   HYDROCODONE-ACETAMINOPHEN (NORCO) 5-325 MG PER TABLET    1-2 tabs po q6 hours prn pain    Recommendations: 1. Discuss case with Curtis Ramos before deciding on antimicrobial therapy   Assessment: Although it is very rare it appears that he probably has tenosynovitis due to Curtis Ramos. Treatment would consist of azithromycin (or clarithromycin), ethambutol and rifampin daily for up to 12 months. I would like to discuss the unusual situation with Curtis Ramos before giving my final recommendation about therapy.   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 Curtis Ramos has had a slowly enlarging mass on the palmar surface of his right hand and index finger for the past year. He has not had any previous surgery at that site and knows of no injury. The mass was nontender. Hip was not associated with any particular redness or warmth. He has not had any fever, chills or sweats. He's had a previous thyroidectomy  and is hypothyroid currently. Otherwise he has been in good health.  He underwent partial excision of the mass on October 15. Pathologic review revealed benign fibroconnective tissue. Routine cultures were negative. AFB cultures have grown Curtis Ramos.  He states that the swelling in his hand is much improved since surgery. 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 He is right-handed.  Review of Systems: Constitutional: negative for anorexia, chills, fatigue, fevers, sweats and weight loss Eyes: negative Ears, nose, mouth, throat, and face: negative Respiratory: negative Cardiovascular: negative Gastrointestinal: negative Genitourinary:negative    Past Medical History  Diagnosis Date  . Hypothyroidism   . Arthritis   . Wears glasses   . PONV (postoperative nausea and vomiting)     History  Substance Use Topics  . Smoking status: Never Smoker   . Smokeless tobacco: Never Used  . Alcohol Use: No    No family history on file. No Known Allergies  OBJECTIVE: Blood pressure 127/74, pulse 76, temperature 97.7 F (36.5 C), temperature source Oral, height 5\' 11"  (1.803 m), weight 173 lb 8 oz (78.699 kg). General: He is a healthy-appearing male who looks younger than his stated age Skin: No rash Lungs: Clear Cor: Regular S1 and S2 with no murmurs Abdomen: Soft and nontender Extremities: He has some firm swelling on the palmar surface of his right hand extending up the base of  his index finger. There is no fluctuance or cellulitis.   Michel Bickers, MD [facility] for Infectious Disease [facility] [phone] pager   [phone] cell 10/07/2014, 3:59 PM  Addendum: I discussed the situation with Curtis Ramos and will go ahead and start him on azithromycin, ethambutol and rifampin for probable Curtis Ramos tenosynovitis.  Michel Bickers, MD [facility] for [facility] [phone] pager   [phone] cell 10/09/2014, 1:17 PM

## 2014-10-09 ENCOUNTER — Telehealth: Payer: Self-pay | Admitting: Internal Medicine

## 2014-10-09 MED ORDER — RIFAMPIN 300 MG PO CAPS: 600.0000 mg | ORAL_CAPSULE | Freq: Every day | ORAL | Status: DC

## 2014-10-09 MED ORDER — AZITHROMYCIN 250 MG PO TABS: 250.0000 mg | ORAL_TABLET | Freq: Every day | ORAL | Status: DC

## 2014-10-09 MED ORDER — ETHAMBUTOL HCL 400 MG PO TABS: 1200.0000 mg | ORAL_TABLET | Freq: Every day | ORAL | Status: DC

## 2014-10-09 NOTE — Addendum Note (Signed)
Addended by: Michel Bickers on: 10/09/2014 01:26 PM   Modules accepted: Orders

## 2014-10-15 LAB — AFB CULTURE WITH SMEAR (NOT AT ARMC): ACID FAST SMEAR: NONE SEEN

## 2014-11-04 ENCOUNTER — Encounter: Payer: Self-pay | Admitting: Internal Medicine

## 2014-11-04 ENCOUNTER — Ambulatory Visit (INDEPENDENT_AMBULATORY_CARE_PROVIDER_SITE_OTHER): Payer: Medicare Other | Admitting: Internal Medicine

## 2014-11-04 VITALS — BP 182/80 | HR 70 | Temp 98.2°F | Wt 179.5 lb

## 2014-11-04 DIAGNOSIS — M25519 Pain in unspecified shoulder: Secondary | ICD-10-CM

## 2014-11-04 DIAGNOSIS — M2550 Pain in unspecified joint: Secondary | ICD-10-CM

## 2014-11-04 DIAGNOSIS — M791 Myalgia, unspecified site: Secondary | ICD-10-CM

## 2014-11-04 HISTORY — DX: Pain in unspecified joint: M25.50

## 2014-11-04 HISTORY — DX: Myalgia, unspecified site: M79.10

## 2014-11-04 HISTORY — DX: Pain in unspecified shoulder: M25.519

## 2014-11-04 NOTE — Progress Notes (Signed)
Patient ID: Curtis Ramos, male   DOB: Jul 18, 1932, 80 y.o.   MRN: [id number]         [facility] for Infectious Disease  Patient Active Problem List   Diagnosis Date Noted  . Myalgia 11/04/2014  . Arthralgia 11/04/2014  . Hypothyroidism associated with surgical procedure 10/07/2014  . DJD (degenerative joint disease) 10/07/2014  . Atypical mycobacterium infection     Patient's Medications  New Prescriptions   No medications on file  Previous Medications   AZITHROMYCIN (ZITHROMAX) 250 MG TABLET    Take 1 tablet (250 mg total) by mouth daily.   COD LIVER OIL 1000 MG CAPS    Take by mouth.   ETHAMBUTOL (MYAMBUTOL) 400 MG TABLET    Take 3 tablets (1,200 mg total) by mouth daily.   LEVOTHYROXINE (SYNTHROID, LEVOTHROID) 150 MCG TABLET    Take 150 mcg by mouth daily before breakfast. Alternated 175,150 every other day   MULTIPLE VITAMINS-MINERALS (MULTIVITAMIN WITH MINERALS) TABLET    Take 1 tablet by mouth daily.   RIFAMPIN (RIFADIN) 300 MG CAPSULE    Take 2 capsules (600 mg total) by mouth daily.   ZINC GLUCONATE 50 MG TABLET    Take 50 mg by mouth daily.  Modified Medications   No medications on file  Discontinued Medications   No medications on file    Subjective: [name] is in for his follow-up visit. He started on azithromycin, ethambutol and rifampin for Mycobacterium avium right hand tenosynovitis after his initial visit with me on November 23. He states that his hand is slowly improving. However, within 2-3 days of starting his antibiotics he began to have severe generalized myalgias and arthralgias, particularly in his knees and neck. He decided to tough it out rather than call me. He has not had any fever, chills or sweats. He has had trouble standing and ambulating because of the pain. Review of Systems: Pertinent items are noted in HPI.  Past Medical History  Diagnosis Date  . Hypothyroidism   . Arthritis   . Wears glasses   . PONV (postoperative nausea and  vomiting)     History  Substance Use Topics  . Smoking status: Never Smoker   . Smokeless tobacco: Never Used  . Alcohol Use: No    No family history on file.  No Known Allergies  Objective: Temp: 98.2 F (36.8 C) (12/21 1552) BP: 182/80 mmHg (12/21 1552) Pulse Rate: 70 (12/21 1552)  General: he is now wearing a soft neck brace because of his neck pain Joints and extremities: The swelling of his right index finger MCP is better. He has some discomfort with neck flexion. No objective abnormalities of his joints noted. He winces slightly when getting out of the chair. His gait is normal but slow     Assessment: He has onset of severe myalgias and arthralgias: Sedated with starting his 3 drug antibiotic regimen for Mycobacterium avium. I'm not sure of the medications are the cause but I will have him stop the medications temporarily to see if his symptoms resolve. If they don't he will need further evaluation for other causes. If he gets much better than I will consider having him restart his medications one at a time to see if I can pinpoint a culprit.  Plan: 1. Hold azithromycin, ethambutol and rifampin for now 2. I will call him in 55 hours   Michel Bickers, MD [facility] for [place] [facility] [phone] pager   [phone]  cell 11/04/2014, 4:17 PM

## 2014-11-06 NOTE — Telephone Encounter (Signed)
I spoke with Curtis Ramos today.he thinks he is feeling a little bit better since stopping azithromycin, ethambutol and rifampin 2 days ago. He still has soreness and weakness in his knees. I suggested that he stay off of the antibiotics through the holidays. I will talk to him on Monday, January 4 and consider restarting the medications one at a time.

## 2014-11-18 ENCOUNTER — Telehealth: Payer: Self-pay | Admitting: Internal Medicine

## 2014-11-18 NOTE — Telephone Encounter (Signed)
Curtis Ramos stopped his 3 drug antibiotic regimen after his last visit with me 11/04/2014. His myalgias and arthralgias resolved within a few days. I spoke with him today and he is feeling normal. He has not noted any change in his hand. I will have him restart his antibiotics one at a time beginning with azithromycin. I will call him in 3 days to see how he is feeling.

## 2014-11-20 DIAGNOSIS — S52531D Colles' fracture of right radius, subsequent encounter for closed fracture with routine healing: Secondary | ICD-10-CM | POA: Diagnosis not present

## 2014-11-21 ENCOUNTER — Telehealth: Payer: Self-pay | Admitting: Internal Medicine

## 2014-11-21 NOTE — Telephone Encounter (Signed)
I spoke to Curtis Ramos today. He feels like his hand is doing better. He saw Dr. Fredna Dow yesterday. He restarted azithromycin on January 4 and has had no problems tolerating it. I asked him to continue taking azithromycin and restart his ethambutol today. I will call him on January 11 to see how he is doing. If he is tolerating both antibiotics I will have him restart rifampin at that time.

## 2014-11-25 ENCOUNTER — Telehealth: Payer: Self-pay | Admitting: Internal Medicine

## 2014-11-25 NOTE — Telephone Encounter (Signed)
I spoke to Curtis Ramos again today. He is doing well and has had no problems tolerating his azithromycin and ethambutol. I instructed him to go ahead and restart rifampin today. I will call him in one week but asked him to call me right away if he starts to feel like he is having any reaction to his medications before.

## 2014-11-26 DIAGNOSIS — M6281 Muscle weakness (generalized): Secondary | ICD-10-CM | POA: Diagnosis not present

## 2014-11-26 DIAGNOSIS — S52531D Colles' fracture of right radius, subsequent encounter for closed fracture with routine healing: Secondary | ICD-10-CM | POA: Diagnosis not present

## 2014-11-26 DIAGNOSIS — M24531 Contracture, right wrist: Secondary | ICD-10-CM | POA: Diagnosis not present

## 2014-11-26 DIAGNOSIS — M25531 Pain in right wrist: Secondary | ICD-10-CM | POA: Diagnosis not present

## 2014-12-03 ENCOUNTER — Telehealth: Payer: Self-pay | Admitting: Internal Medicine

## 2014-12-03 NOTE — Telephone Encounter (Signed)
I called Curtis Ramos today. He is having no problems tolerating his azithromycin, ethambutol or rifampin. He will continue on his 3 drug regimen and I will see him in clinic in 2 days.

## 2014-12-05 ENCOUNTER — Encounter: Payer: Self-pay | Admitting: Internal Medicine

## 2014-12-05 ENCOUNTER — Ambulatory Visit (INDEPENDENT_AMBULATORY_CARE_PROVIDER_SITE_OTHER): Payer: Medicare Other | Admitting: Internal Medicine

## 2014-12-05 VITALS — BP 170/90 | HR 80 | Temp 98.1°F | Wt 168.0 lb

## 2014-12-05 DIAGNOSIS — A319 Mycobacterial infection, unspecified: Secondary | ICD-10-CM

## 2014-12-05 NOTE — Progress Notes (Signed)
Patient ID: Curtis Ramos, male   DOB: 12/18/1931, 81 y.o.   MRN: [id number]         [facility] for Infectious Disease  Patient Active Problem List   Diagnosis Date Noted  . Myalgia 11/04/2014  . Arthralgia 11/04/2014  . Hypothyroidism associated with surgical procedure 10/07/2014  . DJD (degenerative joint disease) 10/07/2014  . Atypical mycobacterium infection     Patient's Medications  New Prescriptions   No medications on file  Previous Medications   AZITHROMYCIN (ZITHROMAX) 250 MG TABLET    Take 1 tablet (250 mg total) by mouth daily.   COD LIVER OIL 1000 MG CAPS    Take by mouth.   ETHAMBUTOL (MYAMBUTOL) 400 MG TABLET    Take 3 tablets (1,200 mg total) by mouth daily.   LEVOTHYROXINE (SYNTHROID, LEVOTHROID) 150 MCG TABLET    Take 150 mcg by mouth daily before breakfast. Alternated 175,150 every other day   MULTIPLE VITAMINS-MINERALS (MULTIVITAMIN WITH MINERALS) TABLET    Take 1 tablet by mouth daily.   RIFAMPIN (RIFADIN) 300 MG CAPSULE    Take 2 capsules (600 mg total) by mouth daily.   ZINC GLUCONATE 50 MG TABLET    Take 50 mg by mouth daily.  Modified Medications   No medications on file  Discontinued Medications   No medications on file    Subjective: Curtis Ramos is in for his routine follow-up visit. Over the past month he has been able to restart his 3 antibiotics and is tolerating them well. He has not had any fever or diarrhea. His weakness has resolved. He has no pain or swelling or redness in his right hand. He still has problems flexing his right index finger and has not been released to do physical therapy for that yet. He has been working and using his right hand. Review of Systems: Pertinent items are noted in HPI.  Past Medical History  Diagnosis Date  . Hypothyroidism   . Arthritis   . Wears glasses   . PONV (postoperative nausea and vomiting)     History  Substance Use Topics  . Smoking status: Never Smoker   . Smokeless tobacco: Never Used    . Alcohol Use: No    No family history on file.  No Known Allergies  Objective: Temp: 98.1 F (36.7 C) (01/21 1055) Temp Source: Oral (01/21 1055) BP: 170/90 mmHg (01/21 1055) Pulse Rate: 80 (01/21 1055)  General: He is in very good spirits Right upper extremity: He is unable to flex his index finger. There is no redness, swelling or warmth. His surgical incision is healed.  No results found for: ESRSEDRATE, CRP   Assessment: I discussed with him the need to continue his current 3 drug regimen against Mycobacterium avium long-term. He is in agreement with that plan. I asked him to call me between visits if he has any new problems tolerating his medications.  Plan: 1. Continue azithromycin, ethambutol and rifampin 2. Follow-up in 6-8 weeks   Michel Bickers, MD [facility] [phone] pager   [phone] cell 12/05/2014, 11:23 AM

## 2014-12-09 ENCOUNTER — Telehealth: Payer: Self-pay | Admitting: Internal Medicine

## 2014-12-09 DIAGNOSIS — B0230 Zoster ocular disease, unspecified: Secondary | ICD-10-CM | POA: Diagnosis not present

## 2014-12-09 NOTE — Telephone Encounter (Signed)
Mr. Lamm developed headache shortly after his visit with me on 12/05/2014 last week. He spoke to my partner, Dr. Johnnye Sima, this weekend. He continued on his 3 drug Mycobacterium avium regimen and is currently seeing his primary care physician, Dr. Lisbeth Ply. So far he's been told that he probably has some sinus pressure. I asked him to call me back if there was any concern that his headache was related to his antibiotic regimen.

## 2014-12-11 DIAGNOSIS — B0230 Zoster ocular disease, unspecified: Secondary | ICD-10-CM | POA: Diagnosis not present

## 2014-12-13 DIAGNOSIS — B0230 Zoster ocular disease, unspecified: Secondary | ICD-10-CM | POA: Diagnosis not present

## 2014-12-17 DIAGNOSIS — B0230 Zoster ocular disease, unspecified: Secondary | ICD-10-CM | POA: Diagnosis not present

## 2014-12-24 DIAGNOSIS — B0230 Zoster ocular disease, unspecified: Secondary | ICD-10-CM | POA: Diagnosis not present

## 2014-12-24 DIAGNOSIS — B0233 Zoster keratitis: Secondary | ICD-10-CM | POA: Diagnosis not present

## 2014-12-25 DIAGNOSIS — B0230 Zoster ocular disease, unspecified: Secondary | ICD-10-CM | POA: Diagnosis not present

## 2014-12-25 DIAGNOSIS — R413 Other amnesia: Secondary | ICD-10-CM | POA: Diagnosis not present

## 2014-12-25 DIAGNOSIS — R634 Abnormal weight loss: Secondary | ICD-10-CM | POA: Diagnosis not present

## 2014-12-25 DIAGNOSIS — R5383 Other fatigue: Secondary | ICD-10-CM | POA: Diagnosis not present

## 2014-12-26 ENCOUNTER — Inpatient Hospital Stay (HOSPITAL_COMMUNITY): Payer: Medicare Other

## 2014-12-26 ENCOUNTER — Encounter (HOSPITAL_COMMUNITY): Payer: Self-pay | Admitting: Emergency Medicine

## 2014-12-26 ENCOUNTER — Inpatient Hospital Stay (HOSPITAL_COMMUNITY)
Admission: EM | Admit: 2014-12-26 | Discharge: 2014-12-31 | DRG: 641 | Disposition: A | Discharge status: Home / Self Care | Payer: Medicare Other | Attending: Family Medicine | Admitting: Family Medicine

## 2014-12-26 DIAGNOSIS — E890 Postprocedural hypothyroidism: Secondary | ICD-10-CM | POA: Diagnosis present

## 2014-12-26 DIAGNOSIS — D649 Anemia, unspecified: Secondary | ICD-10-CM | POA: Diagnosis not present

## 2014-12-26 DIAGNOSIS — N179 Acute kidney failure, unspecified: Secondary | ICD-10-CM

## 2014-12-26 DIAGNOSIS — R222 Localized swelling, mass and lump, trunk: Secondary | ICD-10-CM | POA: Diagnosis not present

## 2014-12-26 DIAGNOSIS — E860 Dehydration: Secondary | ICD-10-CM | POA: Diagnosis present

## 2014-12-26 DIAGNOSIS — Z85850 Personal history of malignant neoplasm of thyroid: Secondary | ICD-10-CM | POA: Diagnosis not present

## 2014-12-26 DIAGNOSIS — R638 Other symptoms and signs concerning food and fluid intake: Secondary | ICD-10-CM

## 2014-12-26 DIAGNOSIS — C801 Malignant (primary) neoplasm, unspecified: Secondary | ICD-10-CM

## 2014-12-26 DIAGNOSIS — E876 Hypokalemia: Secondary | ICD-10-CM | POA: Diagnosis not present

## 2014-12-26 DIAGNOSIS — A319 Mycobacterial infection, unspecified: Secondary | ICD-10-CM | POA: Diagnosis not present

## 2014-12-26 DIAGNOSIS — R918 Other nonspecific abnormal finding of lung field: Secondary | ICD-10-CM | POA: Insufficient documentation

## 2014-12-26 DIAGNOSIS — Z79899 Other long term (current) drug therapy: Secondary | ICD-10-CM | POA: Diagnosis not present

## 2014-12-26 DIAGNOSIS — Z923 Personal history of irradiation: Secondary | ICD-10-CM | POA: Diagnosis not present

## 2014-12-26 DIAGNOSIS — B0229 Other postherpetic nervous system involvement: Secondary | ICD-10-CM | POA: Diagnosis not present

## 2014-12-26 DIAGNOSIS — I10 Essential (primary) hypertension: Secondary | ICD-10-CM | POA: Diagnosis not present

## 2014-12-26 DIAGNOSIS — R9431 Abnormal electrocardiogram [ECG] [EKG]: Secondary | ICD-10-CM | POA: Diagnosis not present

## 2014-12-26 DIAGNOSIS — C73 Malignant neoplasm of thyroid gland: Secondary | ICD-10-CM | POA: Diagnosis not present

## 2014-12-26 HISTORY — DX: Other symptoms and signs concerning food and fluid intake: R63.8

## 2014-12-26 HISTORY — DX: Acute kidney failure, unspecified: N17.9

## 2014-12-26 HISTORY — DX: Gastro-esophageal reflux disease without esophagitis: K21.9

## 2014-12-26 HISTORY — DX: Zoster without complications: B02.9

## 2014-12-26 HISTORY — DX: Malignant (primary) neoplasm, unspecified: C80.1

## 2014-12-26 HISTORY — DX: Hypercalcemia: E83.52

## 2014-12-26 HISTORY — DX: Pulmonary mycobacterial infection: A31.0

## 2014-12-26 LAB — BASIC METABOLIC PANEL
Anion gap: 9 (ref 5–15)
BUN: 18 mg/dL (ref 6–23)
CALCIUM: 13.7 mg/dL — AB (ref 8.4–10.5)
CO2: 28 mmol/L (ref 19–32)
Chloride: 100 mmol/L (ref 96–112)
Creatinine, Ser: 1.58 mg/dL — ABNORMAL HIGH (ref 0.50–1.35)
GFR calc Af Amer: 46 mL/min — ABNORMAL LOW (ref >90)
GFR calc non Af Amer: 40 mL/min — ABNORMAL LOW (ref >90)
GLUCOSE: 124 mg/dL — AB (ref 70–99)
Potassium: 3.8 mmol/L (ref 3.5–5.1)
Sodium: 137 mmol/L (ref 135–145)

## 2014-12-26 LAB — CBC
HCT: 37.2 % — ABNORMAL LOW (ref 39.0–52.0)
HEMATOCRIT: 35.1 % — AB (ref 39.0–52.0)
HEMOGLOBIN: 12.4 g/dL — AB (ref 13.0–17.0)
Hemoglobin: 13.2 g/dL (ref 13.0–17.0)
MCH: 33.8 pg (ref 26.0–34.0)
MCH: 33.7 pg (ref 26.0–34.0)
MCHC: 35.5 g/dL (ref 30.0–36.0)
MCHC: 35.3 g/dL (ref 30.0–36.0)
MCV: 95.4 fL (ref 78.0–100.0)
MCV: 95.4 fL (ref 78.0–100.0)
Platelets: 229 10*3/uL (ref 150–400)
Platelets: 212 10*3/uL (ref 150–400)
RBC: 3.9 MIL/uL — AB (ref 4.22–5.81)
RBC: 3.68 MIL/uL — ABNORMAL LOW (ref 4.22–5.81)
RDW: 14.6 % (ref 11.5–15.5)
RDW: 14.6 % (ref 11.5–15.5)
WBC: 8.9 10*3/uL (ref 4.0–10.5)
WBC: 8.1 10*3/uL (ref 4.0–10.5)

## 2014-12-26 LAB — CREATININE, SERUM
CREATININE: 1.61 mg/dL — AB (ref 0.50–1.35)
GFR calc Af Amer: 45 mL/min — ABNORMAL LOW (ref >90)
GFR calc non Af Amer: 39 mL/min — ABNORMAL LOW (ref >90)

## 2014-12-26 LAB — ALBUMIN: ALBUMIN: 3.5 g/dL (ref 3.5–5.2)

## 2014-12-26 MED ORDER — ONDANSETRON HCL 4 MG PO TABS: 4.0000 mg | ORAL_TABLET | Freq: Four times a day (QID) | ORAL | Status: DC | PRN

## 2014-12-26 MED ORDER — CYCLOSPORINE 0.05 % OP EMUL
1.0000 [drp] | Freq: Two times a day (BID) | OPHTHALMIC | Status: DC
  Administered 2014-12-27 – 2014-12-31 (×9): 1 [drp] via OPHTHALMIC
  Filled 2014-12-26 (×11): qty 1

## 2014-12-26 MED ORDER — ENSURE COMPLETE PO LIQD
237.0000 mL | Freq: Two times a day (BID) | ORAL | Status: DC
  Administered 2014-12-27 – 2014-12-31 (×9): 237 mL via ORAL

## 2014-12-26 MED ORDER — POLYETHYLENE GLYCOL 3350 17 G PO PACK
17.0000 g | PACK | Freq: Every day | ORAL | Status: DC | PRN
  Filled 2014-12-26: qty 1

## 2014-12-26 MED ORDER — LEVOTHYROXINE SODIUM 175 MCG PO TABS
175.0000 ug | ORAL_TABLET | ORAL | Status: DC
  Administered 2014-12-27 – 2014-12-31 (×3): 175 ug via ORAL
  Filled 2014-12-26 (×3): qty 1

## 2014-12-26 MED ORDER — ONDANSETRON HCL 4 MG/2ML IJ SOLN: 4.0000 mg | Freq: Four times a day (QID) | INTRAMUSCULAR | Status: DC | PRN

## 2014-12-26 MED ORDER — RIFAMPIN 300 MG PO CAPS
600.0000 mg | ORAL_CAPSULE | Freq: Every day | ORAL | Status: DC
  Administered 2014-12-27 – 2014-12-31 (×5): 600 mg via ORAL
  Filled 2014-12-26 (×5): qty 2

## 2014-12-26 MED ORDER — SODIUM CHLORIDE 0.9 % IV SOLN
INTRAVENOUS | Status: DC
  Administered 2014-12-26: 20:00:00 via INTRAVENOUS
  Filled 2014-12-26: qty 1000

## 2014-12-26 MED ORDER — LEVOTHYROXINE SODIUM 150 MCG PO TABS
150.0000 ug | ORAL_TABLET | ORAL | Status: DC
  Administered 2014-12-28 – 2014-12-30 (×2): 150 ug via ORAL
  Filled 2014-12-26 (×3): qty 1

## 2014-12-26 MED ORDER — SODIUM CHLORIDE 0.9 % IJ SOLN
3.0000 mL | Freq: Two times a day (BID) | INTRAMUSCULAR | Status: DC
  Administered 2014-12-26 – 2014-12-31 (×8): 3 mL via INTRAVENOUS

## 2014-12-26 MED ORDER — AZITHROMYCIN 250 MG PO TABS
250.0000 mg | ORAL_TABLET | Freq: Every day | ORAL | Status: DC
  Administered 2014-12-27 – 2014-12-31 (×5): 250 mg via ORAL
  Filled 2014-12-26 (×5): qty 1

## 2014-12-26 MED ORDER — SODIUM CHLORIDE 0.9 % IV SOLN
INTRAVENOUS | Status: DC
  Filled 2014-12-26: qty 1000

## 2014-12-26 MED ORDER — ETHAMBUTOL HCL 400 MG PO TABS
1200.0000 mg | ORAL_TABLET | Freq: Every day | ORAL | Status: DC
  Administered 2014-12-27 – 2014-12-31 (×5): 1200 mg via ORAL
  Filled 2014-12-26 (×5): qty 3

## 2014-12-26 MED ORDER — HEPARIN SODIUM (PORCINE) 5000 UNIT/ML IJ SOLN
5000.0000 [IU] | Freq: Three times a day (TID) | INTRAMUSCULAR | Status: DC
  Administered 2014-12-27 – 2014-12-31 (×12): 5000 [IU] via SUBCUTANEOUS
  Filled 2014-12-26 (×17): qty 1

## 2014-12-26 MED ORDER — SODIUM CHLORIDE 0.9 % IV BOLUS (SEPSIS)
1000.0000 mL | Freq: Once | INTRAVENOUS | Status: AC
  Administered 2014-12-26: 1000 mL via INTRAVENOUS

## 2014-12-26 MED ORDER — LOTEPREDNOL ETABONATE 0.5 % OP SUSP
1.0000 [drp] | Freq: Two times a day (BID) | OPHTHALMIC | Status: DC
  Administered 2014-12-27 – 2014-12-31 (×8): 1 [drp] via OPHTHALMIC
  Filled 2014-12-26: qty 5

## 2014-12-26 NOTE — ED Notes (Signed)
Report received from [place], [place], pt transferred into rm C25, placed on monitor,

## 2014-12-26 NOTE — ED Notes (Signed)
Pt arrive POV from home. Called by doctor to come to ED for abnormal labs and hypercalcemia. Pt seen by FMD this week for progressive weakness, lethargy, nausea, vomiting. PT awake, alert, oriented x3, placed on cardiac monitor and EKG obtained.

## 2014-12-26 NOTE — Progress Notes (Addendum)
Patient arrived to 5W03 with family.  Oriented to room and to unit routine.  Stood at bedside to use urinal.  Gait very unsteady and to be considered a high fall risk.  Maintained contact isolation due to recent history of shingles on right side of face.  Educational material exchanged with family members regarding hypercalcemia as per their request.

## 2014-12-26 NOTE — ED Notes (Signed)
Critical Calcium 13.7 reported to Dr. Mingo Amber

## 2014-12-26 NOTE — H&P (Signed)
[facility] Admission History and Physical Service Pager: [phone]  Patient name: Curtis Ramos Medical record number: [id number] Date of birth: 1932/01/23 Age: 81 y.o. Gender: male  Primary Care Provider: Leonides Sake, MD Consultants: none Code Status: FULL (Discussed on admission)  Chief Complaint: decreased PO, weakness, hypercalcemia  Assessment and Plan: Curtis Ramos is a 81 y.o. male presenting with worsening generalized weakness, decreased PO, found to have elevated Calcium at 13.7 from PCP on 12/25/14, sent to ED. 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 DDx: exogenous calcium/vitamin D (vitamin D level normal at outpatient office) vs PTH abnormality vs malignancy (h/o follicular thyroid cancer vs other new CA, note last colonoscopy 10 years ago reported "normal") vs medication induced. Patient symptomatic with anorexia, dehydration, and some questionable occasional minor perception deficits per wife.   -Admit to telemetry under Dr Andria Frames -VS per floor protocol -Repeat CMET in am -Albumin ordered -Will avoid hypercalcemic aggravating meds like thiazide diuretics -PT eval - Up and out of bed as tolerated (being sedentary can aggravate hypercalcemia) -Will volume expand with IVFs @ 200cc/hr then adjust to UOP of 100-150cc/h. Given mod-hyperCa goal to flush out Ca with IVF, proceed with aggressive fluids w/o known hx CHF, mod dehydration on exam -Strict I/Os -Consider Lasix (expedite Ca removal, especially if appears volume overloaded after IVF hydration) vs Salmon calcitonin (can use this for 48 h).  If we use salmon calcitonin will need to repeat calcium in 4-6 hours.  May repeat Salmon calcitonin in 6-12 hours if found to be responsive to calcitonin -If concern for malignancy consider  Zaledronic acid vs Glucocorticoids   -PSA, SPEP, CXR, FOBT ordered  AKI: Cr 1.58 in ED.  Cr 1.49 about 3 days ago at PCPs office.  Unsure of baseline.  Patient with adequate UOP. -IVFs as above -Will avoid nephrotoxic agents  Weakness/Decreased energy: likely secondary to underlying metabolic process vs concern for occult malignancy  -c/s PT  Decreased oral intake:  -c/s to nutrition -would appreciate recs for supplemental nutrition (Boost/ Ensure) that would not have a load of Calcium in it.  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 As there was risidual activity seen in neck after complete thyroidectomy.  Hypothyroidism d/t surgical resection: uses Synthroid 150 and 120mcg alternating doses daily.  Unsure of which he took this am. -Will resume with 160mcg in am   Mycobacterium avium infection of R hand: on Azithromycin, ethambutol, rifampin outpatient, per last ID outpatient visit 12/05/14 recommend at least 6-8 weeks, may need prolonged course. See Dr Michel Bickers [phone].. Only received ethambutol today.  Will need Azithro and Rifampin today.   -Called and LVM with Dr Megan Salon.  Awaiting return call. -Will continue these medications as directed.  Shingles: completed acyclovir outpatient, outbreak 3 weeks ago (R-head/face) -contact isolation  FEN/GI: Normal diet Prophylaxis: sub-q heparin  Disposition: Admit to telemetry under Dr Andria Frames.  Discharge home pending resolution/evaluation of electrolyte abnormality and increase PO intake  History of Present Illness: Curtis Ramos is a 81 y.o. male presenting with hypercalcemia, decreased PO intake and decreased energy.  History provided by both patient and wife.  Patient reported that symptoms started in November 2015 with some swelling in fingers (Right fingers).  He had surgery by hand specialist to remove infection and was sent to see ID. Patient last seen in  January, where he was  advised to continue all antibiotics (Azithro, Ethambutol, and Rifampin).  Patient unsure of when treatment to be completed (likely at least a year per wife).  For now, he was advised to take abx until next appointment.  Wife notes that patient has had decreased energy and appetite x2-3 weeks.  She states that during this time he has lost weight.  She reports that patient is sleeping throughout the day and has been unsteady on his feet.  For this reason, they called his PCP Dr Alfonzo Beers in [place].  Labs were drawn at that office and he was found to be hypercalcemic to 13.7.  (other labs drawn there: Cr 1.49, Protein 7.6, TSH 35.290, WBC 10.1, Testosterone 297, B12: 664, Vit D 31).  Wife also reports occasional recent visual hallucinations.  Stating that patient sees things on floor and goes to pick them up but that they are not there.  Last meal 2-3 days ago. He has been drinking water and supplementing with Boost over the last few days.  Patient endorses nausea (leading to not eating), and did have intermittent vomiting.  Reports that his last BM was yesterday.  Currently, denies dysuria, back pain, abdominal pain, melena or bloody stools.  In addition, patient reports shingles episode 3 weeks ago on Right side of head / face.  He was sent to [facility] for close follow-up, has next appointment scheduled next Tues and is currently taking eye drops daily.  Patient also has a history of thyroid cancer that was removed several years ago.  He follows up with a specialist every 6 months for blood work for monitoring and is currently on Synthyroid 150 and 120mcg alternating doses. Did have history of radiation therapy in 2004 (about 1 year after thyroidectomy).  Patient reports that he does not take calcium supplementation.  He reports that his daily function is normal.  He ambulates and performs ADLs independently.  He currently lives at home with wife in [place], [place]. Previously working daily prior to  symptoms.  Review Of Systems: Per HPI with the following additions: none Otherwise 12 point review of systems was performed and was unremarkable.  Patient Active Problem List   Diagnosis Date Noted  . Myalgia 11/04/2014  . Arthralgia 11/04/2014  . Hypothyroidism associated with surgical procedure 10/07/2014  . DJD (degenerative joint disease) 10/07/2014  . Atypical mycobacterium infection    Past Medical History: Past Medical History  Diagnosis Date  . Hypothyroidism   . Arthritis   . Wears glasses   . PONV (postoperative nausea and vomiting)   . Shingles   . Mycobacterium avium complex   . Cancer    Past Surgical History: Past Surgical History  Procedure Laterality Date  . Thyroidectomy  2003  . Appendectomy    . Inguinal hernia repair      left  . Colonoscopy    . Mass excision Right 08/27/2014    Procedure: EXCISION MASS RIGHT PALM/INDEX;  Surgeon: Leanora Cover, MD;  Location: [facility];  Service: Orthopedics;  Laterality: Right;   Social History: History  Substance Use Topics  . Smoking status: Never Smoker   . Smokeless tobacco: Never Used  . Alcohol Use: No   Additional social history: none  Please also refer to relevant sections of EMR.  Family History: History reviewed. No pertinent family history. Allergies and Medications: No Known Allergies No current facility-administered medications on file prior to encounter.   Current Outpatient Prescriptions on File Prior to  Encounter  Medication Sig Dispense Refill  . azithromycin (ZITHROMAX) 250 MG tablet Take 1 tablet (250 mg total) by mouth daily. 30 tablet 11  . Cod Liver Oil 1000 MG CAPS Take by mouth.    . ethambutol (MYAMBUTOL) 400 MG tablet Take 3 tablets (1,200 mg total) by mouth daily. 90 tablet 11  . levothyroxine (SYNTHROID, LEVOTHROID) 150 MCG tablet Take 150 mcg by mouth daily before breakfast. Alternated 175,150 every other day    . rifampin (RIFADIN) 300 MG capsule Take 2  capsules (600 mg total) by mouth daily. 60 capsule 11  . zinc gluconate 50 MG tablet Take 50 mg by mouth daily.    . Multiple Vitamins-Minerals (MULTIVITAMIN WITH MINERALS) tablet Take 1 tablet by mouth daily.      Objective: BP 174/91 mmHg  Pulse 66  Temp(Src) 98.5 F (36.9 C) (Oral)  Resp 15  Wt 168 lb (76.204 kg)  SpO2 98% Exam: General: alert, drowsy appearing, eyes closed, thin, NAD, wife and pastor at bedside HEENT: [place]/AT, +ocular drainage, no conjunctival injection, MM dry, throat full feeling but thyroid surgically absent, no cervical or supraclavicular LAD appreciated, healing lesions on R side of scalp and face consistent with shingles Cardiovascular: RRR, no m/r/g Respiratory: CTAB, shallow breathing but no respiratory distress Abdomen: soft, NT/ND, +BS Extremities: thin, cool, +2 DP, no edema MSK: Right hand with healed incision scar on palmar MCP joint of R-2nd finger s/p prior I&D by hand surgery Skin: dry, no rashes Neuro: follows commands, speech slowed, AOx3, 5/5 muscle strength, no focal deficits appreciated  Labs and Imaging: CBC BMET   Recent Labs Lab 12/26/14 1007  WBC 8.9  HGB 13.2  HCT 37.2*  PLT 229    Recent Labs Lab 12/26/14 1007  NA 137  K 3.8  CL 100  CO2 28  BUN 18  CREATININE 1.58*  GLUCOSE 124*  CALCIUM 13.7*     PTH - pending  PSA - ordered  SPEP - ordered  CXR - ordered  12/26/14 - NSR, lateral minimal ST depression, no comparison EKG  Janora Norlander, DO 12/26/2014, 12:54 PM PGY-1, [facility] pager: [phone], text pages welcome  Upper Level Addendum:  I have seen and evaluated this patient along with Dr. Lajuana Ripple and reviewed the above note, making necessary revisions in purple.

## 2014-12-26 NOTE — ED Notes (Signed)
Admitting doctors at bedside.

## 2014-12-26 NOTE — Progress Notes (Signed)
@   1710, received report from ED nurse.  Patient has had recent history of shingles.  Requested that ID physician clear patient for admission on contact precautions only / without airborne.  Awaiting response.

## 2014-12-26 NOTE — ED Provider Notes (Signed)
CSN: [id number]     Arrival date & time 12/26/14  4098 History   First MD Initiated Contact with Patient 12/26/14 [phone]     Chief Complaint  Patient presents with  . Abnormal Lab     (Consider location/radiation/quality/duration/timing/severity/associated sxs/prior Treatment) HPI Comments: Hx of thyroid cancer 12 years ago. Sent in by PCP who did labs showing calcium of 13.7. Albumin 4.3.  Seen by PCP for worsening weakness, lethargy, nausea, vomiting.  Patient is a 81 y.o. male presenting with weakness. The history is provided by the patient.  Weakness This is a new problem. The current episode started more than 1 week ago (about 2 weeks). The problem occurs constantly. The problem has been gradually worsening. Pertinent negatives include no chest pain, no abdominal pain and no shortness of breath. Nothing aggravates the symptoms. Nothing relieves the symptoms. He has tried nothing for the symptoms.    Past Medical History  Diagnosis Date  . Hypothyroidism   . Arthritis   . Wears glasses   . PONV (postoperative nausea and vomiting)   . Shingles   . Mycobacterium avium complex   . Cancer    Past Surgical History  Procedure Laterality Date  . Thyroidectomy  2003  . Appendectomy    . Inguinal hernia repair      left  . Colonoscopy    . Mass excision Right 08/27/2014    Procedure: EXCISION MASS RIGHT PALM/INDEX;  Surgeon: Leanora Cover, MD;  Location: [facility];  Service: Orthopedics;  Laterality: Right;   History reviewed. No pertinent family history. History  Substance Use Topics  . Smoking status: Never Smoker   . Smokeless tobacco: Never Used  . Alcohol Use: No    Review of Systems  Constitutional: Positive for appetite change (decreased).  Respiratory: Negative for shortness of breath.   Cardiovascular: Negative for chest pain.  Gastrointestinal: Positive for nausea and vomiting (nonbilious, nonbloody). Negative for abdominal pain.  Genitourinary:  Negative for penile swelling.  Neurological: Positive for weakness.  Psychiatric/Behavioral: Positive for hallucinations (visual, picking up things that aren't there).  All other systems reviewed and are negative.     Allergies  Review of patient's allergies indicates no known allergies.  Home Medications   Prior to Admission medications   Medication Sig Start Date End Date Taking? Authorizing Provider  azithromycin (ZITHROMAX) 250 MG tablet Take 1 tablet (250 mg total) by mouth daily. 10/09/14   Michel Bickers, MD  Cod Liver Oil 1000 MG CAPS Take by mouth.    Historical Provider, MD  ethambutol (MYAMBUTOL) 400 MG tablet Take 3 tablets (1,200 mg total) by mouth daily. 10/09/14   Michel Bickers, MD  levothyroxine (SYNTHROID, LEVOTHROID) 150 MCG tablet Take 150 mcg by mouth daily before breakfast. Alternated 175,150 every other day    Historical Provider, MD  Multiple Vitamins-Minerals (MULTIVITAMIN WITH MINERALS) tablet Take 1 tablet by mouth daily.    Historical Provider, MD  rifampin (RIFADIN) 300 MG capsule Take 2 capsules (600 mg total) by mouth daily. 10/09/14   Michel Bickers, MD  zinc gluconate 50 MG tablet Take 50 mg by mouth daily.    Historical Provider, MD   BP 174/95 mmHg  Pulse 73  Temp(Src) 98.5 F (36.9 C) (Oral)  Resp 7  Wt 168 lb (76.204 kg)  SpO2 97% Physical Exam  Constitutional: He is oriented to person, place, and time. He appears well-developed and well-nourished. No distress.  HENT:  Head: Normocephalic and atraumatic.  Mouth/Throat: No oropharyngeal exudate.  Eyes: EOM are normal. Pupils are equal, round, and reactive to light.  Neck: Normal range of motion. Neck supple.  Cardiovascular: Normal rate and regular rhythm.  Exam reveals no friction rub.   No murmur heard. Pulmonary/Chest: Effort normal and breath sounds normal. No respiratory distress. He has no wheezes. He has no rales.  Abdominal: He exhibits no distension. There is no tenderness. There is no  rebound.  Musculoskeletal: Normal range of motion. He exhibits no edema.  Neurological: He is alert and oriented to person, place, and time.  Skin: He is not diaphoretic.  Nursing note and vitals reviewed.   ED Course  Procedures (including critical care time) Labs Review Labs Reviewed - No data to display  Imaging Review Dg Chest 2 View  12/27/2014   CLINICAL DATA:  Acute onset of weakness. Hypercalcemia. Initial encounter.  EXAM: CHEST  2 VIEW  COMPARISON:  Right shoulder radiographs performed 08/01/2012  FINDINGS: The lungs are well-aerated. Mild right upper lung zone airspace opacity could reflect mild pneumonia. There is no evidence of pleural effusion or pneumothorax.  The heart is normal in size; the mediastinal contour is within normal limits. No acute osseous abnormalities are seen. Postoperative change is noted about the thyroid bed.  IMPRESSION: Mild right upper lung zone airspace opacity could reflect mild pneumonia. If the patient does have symptoms of pneumonia, would perform follow-up chest radiograph after completion of treatment for pneumonia, to ensure resolution of airspace opacity.   Electronically Signed   By: Garald Balding M.D.   On: 12/27/2014 05:05     EKG Interpretation   Date/Time:  Thursday December 26 2014 09:39:50 EST Ventricular Rate:  77 PR Interval:  193 QRS Duration: 87 QT Interval:  412 QTC Calculation: 466 R Axis:   21 Text Interpretation:  Age not entered, assumed to be  81 years old for  purpose of ECG interpretation Sinus rhythm Anteroseptal infarct, old  Minimal ST depression, lateral leads Baseline wander in lead(s) V4 No  significant change since prior Confirmed by Mingo Amber  MD, Lorrene Graef ([id number]) on  12/26/2014 9:54:48 AM      MDM   Final diagnoses:  Hypercalcemia    76M here for hypercalcemia. Labs sent by PCP with initial calcium of 13.7. Will repeat. Concern he is symptomatic due to psychiatric symptoms of visual hallucinations. Exam  benign, normal mental status here.  Calcium 13.7 here. Fluids given. Admitted.    Evelina Bucy, MD 12/27/14 [phone]

## 2014-12-26 NOTE — ED Notes (Addendum)
Spoke with Dr. Tommy Medal with infectious disease regarding contact precautions for recent shingles dx. sts pt does not need airborne precautions. 5W notified.

## 2014-12-27 ENCOUNTER — Inpatient Hospital Stay (HOSPITAL_COMMUNITY): Payer: Medicare Other

## 2014-12-27 ENCOUNTER — Encounter (HOSPITAL_COMMUNITY): Payer: Self-pay | Admitting: Radiology

## 2014-12-27 DIAGNOSIS — C801 Malignant (primary) neoplasm, unspecified: Secondary | ICD-10-CM | POA: Insufficient documentation

## 2014-12-27 DIAGNOSIS — A319 Mycobacterial infection, unspecified: Secondary | ICD-10-CM

## 2014-12-27 LAB — COMPREHENSIVE METABOLIC PANEL
ALT: 45 U/L (ref 0–53)
ANION GAP: 7 (ref 5–15)
AST: 33 U/L (ref 0–37)
Albumin: 3.4 g/dL — ABNORMAL LOW (ref 3.5–5.2)
Alkaline Phosphatase: 72 U/L (ref 39–117)
BILIRUBIN TOTAL: 0.8 mg/dL (ref 0.3–1.2)
BUN: 17 mg/dL (ref 6–23)
CALCIUM: 11.8 mg/dL — AB (ref 8.4–10.5)
CHLORIDE: 108 mmol/L (ref 96–112)
CO2: 24 mmol/L (ref 19–32)
CREATININE: 1.56 mg/dL — AB (ref 0.50–1.35)
GFR, EST AFRICAN AMERICAN: 47 mL/min — AB (ref >90)
GFR, EST NON AFRICAN AMERICAN: 40 mL/min — AB (ref >90)
GLUCOSE: 101 mg/dL — AB (ref 70–99)
Potassium: 3.5 mmol/L (ref 3.5–5.1)
Sodium: 139 mmol/L (ref 135–145)
Total Protein: 6.7 g/dL (ref 6.0–8.3)

## 2014-12-27 LAB — PARATHYROID HORMONE, INTACT (NO CA): PTH: 39 pg/mL (ref 15–65)

## 2014-12-27 LAB — CBC
HCT: 33.3 % — ABNORMAL LOW (ref 39.0–52.0)
HEMOGLOBIN: 11.6 g/dL — AB (ref 13.0–17.0)
MCH: 34.1 pg — ABNORMAL HIGH (ref 26.0–34.0)
MCHC: 34.8 g/dL (ref 30.0–36.0)
MCV: 97.9 fL (ref 78.0–100.0)
Platelets: 206 10*3/uL (ref 150–400)
RBC: 3.4 MIL/uL — ABNORMAL LOW (ref 4.22–5.81)
RDW: 14.7 % (ref 11.5–15.5)
WBC: 8 10*3/uL (ref 4.0–10.5)

## 2014-12-27 LAB — FREE PSA
PSA, Free Pct: 15 % — ABNORMAL LOW (ref >25)
PSA, Free: 0.33 ng/mL

## 2014-12-27 LAB — PSA: PSA: 2.17 ng/mL (ref <=4.00)

## 2014-12-27 MED ORDER — IOHEXOL 300 MG/ML  SOLN
80.0000 mL | Freq: Once | INTRAMUSCULAR | Status: AC | PRN
Start: 2014-12-27 — End: 2014-12-27
  Administered 2014-12-27: 80 mL via INTRAVENOUS

## 2014-12-27 MED ORDER — IOHEXOL 300 MG/ML  SOLN
25.0000 mL | INTRAMUSCULAR | Status: AC
Start: 2014-12-27 — End: 2014-12-27
  Administered 2014-12-27 (×2): 25 mL via ORAL

## 2014-12-27 MED ORDER — BOOST / RESOURCE BREEZE PO LIQD
1.0000 | ORAL | Status: DC
  Administered 2014-12-28 – 2014-12-30 (×2): 1 via ORAL

## 2014-12-27 MED ORDER — SODIUM CHLORIDE 0.9 % IV SOLN
INTRAVENOUS | Status: DC
  Administered 2014-12-27 – 2014-12-30 (×9): via INTRAVENOUS

## 2014-12-27 NOTE — Progress Notes (Signed)
Family Medicine Teaching Service Daily Progress Note Intern Pager: [phone]  Patient name: Curtis Ramos Medical record number: [id number] Date of birth: September 07, 1932 Age: 81 y.o. Gender: male  Primary Care Provider: Leonides Sake, MD Consultants: none Code Status: FULL (discussed on admission)  Pt Overview and Major Events to Date:   Assessment and Plan: Curtis Ramos is a 81 y.o. male presenting with worsening generalized weakness, decreased PO, found to have elevated Calcium at 13.7 from PCP on 12/25/14, sent to ED. 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 Corrected Ca in ED 14.1>>12.3. CXR: with questionable opacity RUL. PSA: normal.   -Telemetry -VS per floor protocol -Daily CMET  -Will avoid hypercalcemic aggravating meds like thiazide diuretics -PT eval - Up and out of bed with assistance (being sedentary can aggravate hypercalcemia) -Will continue to volume expand with IVFs . -UOP overnight only 75cc/h.  Increase to 250cc/h then adjust to UOP of 100-150cc/h. Given mod-hyperCa goal to flush out Ca with IVF, proceed with aggressive fluids w/o known hx CHF, mod dehydration on exam -Strict I/Os -Consider Lasix (expedite Ca removal, especially if appears volume overloaded after IVF hydration) vs Salmon calcitonin (can use this for 48 h). If we use salmon calcitonin will need to repeat calcium in 4-6 hours. May repeat Salmon calcitonin in 6-12 hours if found to be responsive to calcitonin -If concern for malignancy consider Zaledronic acid vs Glucocorticoids   -SPEP pending, FOBT ordered  -will CT chest, abdomen, pelvis.  If lytic lesions>>bone survey; if suspect Padgets' >> bone scan per Rads  -Alerted Radiology to slightly elevated Cr.  Patient well hydrated.  Do no suspect that renal function should be a  problem.    AKI: Cr 1.58 in ED. Cr 1.49 about 3 days ago at PCPs office. Unsure of baseline. Patient with adequate UOP. Today's Cr 1.56 -IVFs as above -Will avoid nephrotoxic agents  Weakness/Decreased energy: likely secondary to underlying metabolic process vs concern for occult malignancy  -fall precautions -c/s PT  Decreased oral intake:  -c/s to nutrition -would appreciate recs for supplemental nutrition (Boost/ Ensure) that would not have a load of Calcium in it.  Anemia: hgb 11.6 this am.  Likely dilutional  Hurthle cell carcinoma (Micropapillary(follicular) carcinoma of thyroid): radiation in 2004? As there was risidual activity seen in neck after complete thyroidectomy.  Hypothyroidism d/t surgical resection: uses Synthroid 150 and 159mcg alternating doses daily. Unsure of which he took this am. -Will resume with 177mcg in am   Mycobacterium avium infection of R hand: on Azithromycin, ethambutol, rifampin outpatient, per last ID outpatient visit 12/05/14 recommend at least 6-8 weeks, may need prolonged course. See Dr Michel Bickers [phone].. Only received ethambutol today. Will need Azithro and Rifampin today.  -Called and LVM with Dr Megan Salon. Awaiting return call. -Will continue these medications as directed.  Shingles: completed acyclovir outpatient, outbreak 3 weeks ago (R-head/face) -contact isolation  FEN/GI: Normal diet, IVFs NS @250cc /h Prophylaxis: sub-q heparin  Disposition: Discharge once hypercalcemia resolved and patient taking better PO.  Subjective:  Patient appears disgruntled this morning.  He reports that he was dissatisfied with CXR being performed in the middle of the night.  In addition, is upset because his food has not been warm enough.  This is his reasoning for poor PO yesterday.  He denies any other physical concerns at this time.  Son also present  for exam, who voices good understanding of plan and has no concerns at this time.   Per RN  note, patient unsteady on his feet when he went to urinate at bedside commode overnight.   Objective: Temp:  [97.5 F (36.4 C)-98.7 F (37.1 C)] 97.5 F (36.4 C) (02/12 0540) Pulse Rate:  [66-112] 112 (02/12 0540) Resp:  [7-18] 18 (02/12 0540) BP: (140-174)/(73-98) 170/97 mmHg (02/12 0540) SpO2:  [93 %-100 %] 96 % (02/12 0540) Weight:  [165 lb 12.6 oz (75.2 kg)-168 lb (76.204 kg)] 165 lb 12.6 oz (75.2 kg) (02/12 0540) Physical Exam: General: awake, eye closed sitting up in bed, NAD, son and Dr Andria Frames at bedside Cardiovascular: RRR, no murmurs Respiratory: CTAB, no increased WOB Abdomen: soft, NT/ND Extremities: thin, WWP, no edema Psych: slowed speech (same as yesterday), follows commands, seems somewhat confused   Laboratory:  Recent Labs Lab 12/26/14 1007 12/26/14 2051 12/27/14 0548  WBC 8.9 8.1 8.0  HGB 13.2 12.4* 11.6*  HCT 37.2* 35.1* 33.3*  PLT 229 212 206    Recent Labs Lab 12/26/14 1007 12/26/14 2051 12/27/14 0548  NA 137  --  139  K 3.8  --  3.5  CL 100  --  108  CO2 28  --  24  BUN 18  --  17  CREATININE 1.58* 1.61* 1.56*  CALCIUM 13.7*  --  11.8*  PROT  --   --  6.7  BILITOT  --   --  0.8  ALKPHOS  --   --  72  ALT  --   --  45  AST  --   --  33  GLUCOSE 124*  --  101*   2/11: Corrected Ca 14.1 2/12: Corrected Ca 12.3 SPEP pending  Imaging/Diagnostic Tests: Dg Chest 2 View  12/27/2014   CLINICAL DATA:  Acute onset of weakness. Hypercalcemia. Initial encounter.  EXAM: CHEST  2 VIEW  COMPARISON:  Right shoulder radiographs performed 08/01/2012  FINDINGS: The lungs are well-aerated. Mild right upper lung zone airspace opacity could reflect mild pneumonia. There is no evidence of pleural effusion or pneumothorax.  The heart is normal in size; the mediastinal contour is within normal limits. No acute osseous abnormalities are seen. Postoperative change is noted about the thyroid bed.  IMPRESSION: Mild right upper lung zone airspace opacity could reflect  mild pneumonia. If the patient does have symptoms of pneumonia, would perform follow-up chest radiograph after completion of treatment for pneumonia, to ensure resolution of airspace opacity.   Electronically Signed   By: Garald Balding M.D.   On: 12/27/2014 05:05   Janora Norlander, DO 12/27/2014, 7:26 AM PGY-1, [place] Intern pager: [phone], text pages welcome

## 2014-12-27 NOTE — Progress Notes (Signed)
INITIAL NUTRITION ASSESSMENT  DOCUMENTATION CODES Per approved criteria  -Not Applicable   INTERVENTION: -Continue Ensure complete BID providing 350 kcal and 13 g protein -Resource Breeze Q24H providing 250 kcal and 9 g protien  NUTRITION DIAGNOSIS: Inadequate oral intake related to decreased appetite as evidenced by hypocalcemia and shingles infection.   Goal: Pt to meet >/= 90% of estimated needs  Monitor:  Pt supplement tolerance, weight trends, labs  Reason for Assessment: Consult per MD  81 y.o. male  Admitting Dx: Hypercalcemia  ASSESSMENT: Pt with history of hypothyroidism d/t surgical resection and hurthle cell carcinoma.  MD request supplement recommendations low in calcium.    Pt and wife reports he has been eating >50% for the past two weeks, but have not noticed weight loss.  Report usual body weight is 168 lbs.    Pt eating lunch tray during interview although reports he did not eat breakfast because everything was cold.  Obtained diet recall from pt and wife.  At breakfast he usually eats eggs, sausage/bacon, for lunch he has leftovers and a dinner his wife makes some sort of meat, a vegetable and a potato with butter on top.  It does not appear he gets a lot of dietary sources of Calcium.    Ensure Complete/Boost contain 350 mg of Calcium in a bottle.  RDA for americans over 71 years is 1200 mg/day.  Ensure complete already ordered for pt.  Ensure Complete BID would provide 700 mg/calcium a day.    Also discussed [employer] with patient and family.  Breeze does not contain Calcium.  They expressed they would like to try it if the calcium consumption becomes a problem.    Will continue to monitor Calcium labs trends.    Height: Ht Readings from Last 1 Encounters:  12/26/14 6' (1.829 m)    Weight: Wt Readings from Last 1 Encounters:  12/27/14 165 lb 12.6 oz (75.2 kg)    Ideal Body Weight: 178 lbs  % Ideal Body Weight: 92%  Wt Readings from Last  10 Encounters:  12/27/14 165 lb 12.6 oz (75.2 kg)  12/05/14 168 lb (76.204 kg)  11/04/14 179 lb 8 oz (81.421 kg)  10/07/14 173 lb 8 oz (78.699 kg)  08/27/14 166 lb (75.297 kg)    Usual Body Weight: 168 lbs  % Usual Body Weight: 98%  BMI:  Body mass index is 22.48 kg/(m^2).  Estimated Nutritional Needs: Kcal: 1900-2100 Protein: 95-110 g protein Fluid: >/= 1.9 L/day  Skin: shingles on face  Diet Order: Diet regular  EDUCATION NEEDS: -No education needs identified at this time   Intake/Output Summary (Last 24 hours) at 12/27/14 0957 Last data filed at 12/27/14 0829  Gross per 24 hour  Intake 2233.33 ml  Output   1350 ml  Net 883.33 ml    Last BM: PTA   Labs:   Recent Labs Lab 12/26/14 1007 12/26/14 2051 12/27/14 0548  NA 137  --  139  K 3.8  --  3.5  CL 100  --  108  CO2 28  --  24  BUN 18  --  17  CREATININE 1.58* 1.61* 1.56*  CALCIUM 13.7*  --  11.8*  GLUCOSE 124*  --  101*    CBG (last 3)  No results for input(s): GLUCAP in the last 72 hours.  Scheduled Meds: . azithromycin  250 mg Oral Daily  . cycloSPORINE  1 drop Right Eye BID  . ethambutol  1,200 mg Oral Daily  .  feeding supplement (ENSURE COMPLETE)  237 mL Oral BID BM  . heparin  5,000 Units Subcutaneous 3 times per day  . [START ON 12/28/2014] levothyroxine  150 mcg Oral Q48H  . levothyroxine  175 mcg Oral Q48H  . loteprednol  1 drop Right Eye BID  . rifampin  600 mg Oral Daily  . sodium chloride  3 mL Intravenous Q12H    Continuous Infusions: . sodium chloride 250 mL/hr at 12/27/14 7841    Past Medical History  Diagnosis Date  . Hypothyroidism   . Wears glasses   . PONV (postoperative nausea and vomiting)   . Shingles   . Mycobacterium avium complex   . GERD (gastroesophageal reflux disease)   . Arthritis     "proabably"  . Cancer     family unaware of this hx on 12/26/2014    Past Surgical History  Procedure Laterality Date  . Thyroidectomy  2003  . Appendectomy    .  Colonoscopy    . Mass excision Right 08/27/2014    Procedure: EXCISION MASS RIGHT PALM/INDEX;  Surgeon: Leanora Cover, MD;  Location: [facility];  Service: Orthopedics;  Laterality: Right;  . Inguinal hernia repair Left   . Cataract extraction w/ intraocular lens  implant, bilateral Bilateral     Elmer Picker MS Dietetic Intern Pager Number [phone]

## 2014-12-27 NOTE — Progress Notes (Signed)
PT Cancellation/DC Note  Patient Details Name: Curtis Ramos MRN: [id number] DOB: Sep 04, 1932   Cancelled Treatment:    Reason Eval/Treat Not Completed: PT screened, no needs identified, will sign off.  Upon PTs arrival to the unit the pt was walking in the hallways.  Family (wife and son) report this is the second time they have walked today and feel like the pt is walking much better than he was at home. They declined PT services at this time as they feel comfortable helping him get up and walk and the fact that he is doing better than he was at home. Encouraged them to walk at least 3 times/day and for pt not to walk unless family or staff was present.  Family answered many of my questions and when pt did answer it was vague and sometimes out of context. Not sure what his baseline cognition is, but cognitive deficits may be present.     Discussed case with pts RN.  If needs arise please re-consult PT. Will sign-off for now.  Melvern Banker 12/27/2014, 3:27 PM  Lavonia Dana, [place] 12/27/2014

## 2014-12-28 DIAGNOSIS — R222 Localized swelling, mass and lump, trunk: Secondary | ICD-10-CM

## 2014-12-28 DIAGNOSIS — R918 Other nonspecific abnormal finding of lung field: Secondary | ICD-10-CM | POA: Insufficient documentation

## 2014-12-28 LAB — COMPREHENSIVE METABOLIC PANEL
ALBUMIN: 3 g/dL — AB (ref 3.5–5.2)
ALK PHOS: 70 U/L (ref 39–117)
ALT: 45 U/L (ref 0–53)
AST: 33 U/L (ref 0–37)
Anion gap: 8 (ref 5–15)
BILIRUBIN TOTAL: 0.7 mg/dL (ref 0.3–1.2)
BUN: 11 mg/dL (ref 6–23)
CALCIUM: 10 mg/dL (ref 8.4–10.5)
CO2: 24 mmol/L (ref 19–32)
Chloride: 108 mmol/L (ref 96–112)
Creatinine, Ser: 1.19 mg/dL (ref 0.50–1.35)
GFR calc Af Amer: 65 mL/min — ABNORMAL LOW (ref >90)
GFR, EST NON AFRICAN AMERICAN: 56 mL/min — AB (ref >90)
Glucose, Bld: 90 mg/dL (ref 70–99)
Potassium: 3.1 mmol/L — ABNORMAL LOW (ref 3.5–5.1)
SODIUM: 140 mmol/L (ref 135–145)
Total Protein: 6.1 g/dL (ref 6.0–8.3)

## 2014-12-28 LAB — CBC
HCT: 30.6 % — ABNORMAL LOW (ref 39.0–52.0)
HEMOGLOBIN: 10.9 g/dL — AB (ref 13.0–17.0)
MCH: 34.2 pg — ABNORMAL HIGH (ref 26.0–34.0)
MCHC: 35.6 g/dL (ref 30.0–36.0)
MCV: 95.9 fL (ref 78.0–100.0)
Platelets: 199 10*3/uL (ref 150–400)
RBC: 3.19 MIL/uL — ABNORMAL LOW (ref 4.22–5.81)
RDW: 14.5 % (ref 11.5–15.5)
WBC: 6.5 10*3/uL (ref 4.0–10.5)

## 2014-12-28 MED ORDER — HYDRALAZINE HCL 20 MG/ML IJ SOLN
2.0000 mg | INTRAMUSCULAR | Status: DC | PRN
  Administered 2014-12-29 (×2): 2 mg via INTRAVENOUS
  Filled 2014-12-28 (×2): qty 1

## 2014-12-28 MED ORDER — POTASSIUM CHLORIDE CRYS ER 20 MEQ PO TBCR
40.0000 meq | EXTENDED_RELEASE_TABLET | Freq: Two times a day (BID) | ORAL | Status: AC
  Administered 2014-12-28 (×2): 40 meq via ORAL
  Filled 2014-12-28 (×2): qty 2

## 2014-12-28 MED ORDER — ACETAMINOPHEN 325 MG PO TABS
650.0000 mg | ORAL_TABLET | Freq: Four times a day (QID) | ORAL | Status: DC | PRN
  Administered 2014-12-28 – 2014-12-31 (×5): 650 mg via ORAL
  Filled 2014-12-28 (×5): qty 2

## 2014-12-28 NOTE — Progress Notes (Signed)
Family Medicine Teaching Service Daily Progress Note Intern Pager: [phone]  Patient name: Curtis Ramos Medical record number: [id number] Date of birth: [birth year] Age: 81 y.o. Gender: male  Primary Care Provider: Leonides Sake, MD Consultants: none Code Status: FULL (discussed on admission)  Pt Overview and Major Events to Date:   Assessment and Plan: BRIXTON FRANKO is a 81 y.o. male presenting with worsening generalized weakness, decreased PO, found to have elevated Calcium at 13.7 from PCP on 12/25/14, sent to ED. 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 Corrected Ca in ED 14.1>>10.8 2/13. CXR: with questionable opacity RUL. PSA: normal.   -Telemetry -VS per floor protocol -Daily CMET  -Will avoid hypercalcemic aggravating meds like thiazide diuretics -PT eval - Up and out of bed with assistance (being sedentary can aggravate hypercalcemia) -Will continue to volume expand with IVFs . Decreased 2/13 from 250 to 100cc/hr. -UOP 3.5L last 24h (~150cc/hr).  Headache and BPs 170-190s o/n - concern for fluid overload. Decreased to 150cc/hr; goal UOP of 100-150cc/h. Given mod-hyperCa goal to flush out Ca with IVF, proceed with aggressive fluids w/o known hx CHF, mod dehydration on exam -Strict I/Os -Consider Lasix (expedite Ca removal, especially if appears volume overloaded after IVF hydration) vs Salmon calcitonin (can use this for 48 h). If we use salmon calcitonin will need to repeat calcium in 4-6 hours. May repeat Salmon calcitonin in 6-12 hours if found to be responsive to calcitonin - If concern for malignancy consider Zaledronic acid vs Glucocorticoids   -SPEP pending, FOBT ordered  -CT chest, abdomen, pelvis. No lytic lesions, so ordered BONE SCAN instead of bone survey (eval for Paget's per Rads)              -1.3 mass RLL suspicious for malignancy>>Consulted IR Dr Vernard Gambles who suggested thoracic surg consult to possibly remove the mass if pt good surgical candidate. Will await formal recs before consulting thoracic surg. Placed CT biopsy without contrast for now per Dr Vernard Gambles rec.  -Alerted Radiology to slightly elevated Cr.  Patient well hydrated.  Do no suspect that renal function should be a problem.    AKI: Cr 1.58 in ED. Cr 1.49 about 3 days ago at PCPs office. Unsure of baseline. Patient with adequate UOP. Today's Cr 1.1 -IVFs as above -Will avoid nephrotoxic agents  Weakness/Decreased energy: likely secondary to underlying metabolic process vs concern for occult malignancy  - fall precautions - c/s PT - family declined and were walking pt in hallway.  - could consider vitamin D, TSH, Mg, ESR, CRP  Decreased oral intake:  -c/s to nutrition -Appreciate recs for supplemental nutrition (Boost/ Ensure) that would not have a load of Calcium in it (resource breeze per dietitian); no change for now  Anemia: hgb 11.6 2/12 and 2/13.  Likely dilutional. Nl MCV. 13.7 on admission.  - Recheck tomorrow  Hurthle cell carcinoma (Micropapillary(follicular) carcinoma of thyroid): radiation in 2004? As there was risidual activity seen in neck after complete thyroidectomy.  Hypothyroidism d/t surgical resection: uses Synthroid 150 and 170mcg alternating doses daily. Unsure of which he took this am. -Resumed, starting with 139mcg 2/12  Mycobacterium avium infection of R hand: on Azithromycin, ethambutol, rifampin outpatient, per last ID outpatient visit 12/05/14 recommend at least 6-8 weeks, may need prolonged course. See Dr Michel Bickers [phone].. Only received ethambutol today. Will need Azithro and Rifampin today.  -  Called and LVM with Dr Megan Salon. Awaiting return call. -Will continue these medications as directed.  Shingles: completed acyclovir outpatient, outbreak 3 weeks ago  (R-head/face) -contact isolation  FEN/GI: Normal diet, IVFs NS $Remov'@100cc'ZJkxfp$ /h - hypokalemia to 3.1 - 60mEq kdur x 2 and recheck in AM Prophylaxis: sub-q heparin  Disposition: Discharge once hypercalcemia resolved, chest mass workup completed, and patient taking better PO.  Subjective: Doing well this morning, no chest pain or dyspnea. Has good questions about his weakness and interacts appropriately.  Objective: Temp:  [98.7 F (37.1 C)-99.3 F (37.4 C)] 99 F (37.2 C) (02/13 6045) Pulse Rate:  [73] 73 (02/13 0608) Resp:  [15-16] 15 (02/13 0608) BP: (142-184)/(78-93) 142/78 mmHg (02/13 0608) SpO2:  [95 %-100 %] 98 % (02/13 0608) Weight:  [165 lb 12.6 oz (75.2 kg)] 165 lb 12.6 oz (75.2 kg) (02/13 4098) Physical Exam: General: awake, eye closed sitting up in bed, NAD, son and wife in room Cardiovascular: RRR, no murmurs Respiratory: CTAB, no increased WOB Abdomen: soft, NT/ND Extremities: thin, WWP, no edema Psych: slowed speech (same as yesterday), follows commands, linear thought process Neuro: Walking with help from wife as I enter room; normal speech; no focal deficit Skin: Right forehead with dry rash  Laboratory:  Recent Labs Lab 12/26/14 2051 12/27/14 0548 12/28/14 0510  WBC 8.1 8.0 6.5  HGB 12.4* 11.6* 10.9*  HCT 35.1* 33.3* 30.6*  PLT 212 206 199    Recent Labs Lab 12/26/14 1007 12/26/14 2051 12/27/14 0548 12/28/14 0510  NA 137  --  139 140  K 3.8  --  3.5 3.1*  CL 100  --  108 108  CO2 28  --  24 24  BUN 18  --  17 11  CREATININE 1.58* 1.61* 1.56* 1.19  CALCIUM 13.7*  --  11.8* 10.0  PROT  --   --  6.7 6.1  BILITOT  --   --  0.8 0.7  ALKPHOS  --   --  72 70  ALT  --   --  45 45  AST  --   --  33 33  GLUCOSE 124*  --  101* 90   2/11: Corrected Ca 14.1 2/12: Corrected Ca 12.3 SPEP pending  Imaging/Diagnostic Tests: Dg Chest 2 View  12/27/2014   CLINICAL DATA:  Acute onset of weakness. Hypercalcemia. Initial encounter.  EXAM: CHEST  2 VIEW   COMPARISON:  Right shoulder radiographs performed 08/01/2012  FINDINGS: The lungs are well-aerated. Mild right upper lung zone airspace opacity could reflect mild pneumonia. There is no evidence of pleural effusion or pneumothorax.  The heart is normal in size; the mediastinal contour is within normal limits. No acute osseous abnormalities are seen. Postoperative change is noted about the thyroid bed.  IMPRESSION: Mild right upper lung zone airspace opacity could reflect mild pneumonia. If the patient does have symptoms of pneumonia, would perform follow-up chest radiograph after completion of treatment for pneumonia, to ensure resolution of airspace opacity.   Electronically Signed   By: Garald Balding M.D.   On: 12/27/2014 05:05   Ct Chest W Contrast  12/27/2014   CLINICAL DATA:  Occult malignancy.  EXAM: CT CHEST, ABDOMEN, AND PELVIS WITH CONTRAST  TECHNIQUE: Multidetector CT imaging of the chest, abdomen and pelvis was performed following the standard protocol during bolus administration of intravenous contrast.  CONTRAST:  1 OMNIPAQUE IOHEXOL 300 MG/ML SOLN, 60mL OMNIPAQUE IOHEXOL 300 MG/ML SOLN  COMPARISON:  Chest x-ray 12/26/2014  FINDINGS: CT CHEST FINDINGS  Heart: Heart size is normal. Coronary artery calcifications are present. No pericardial effusion.  Vascular structures: There is atherosclerotic calcification of the thoracic aorta. No aneurysm or dissection. Normal arch anatomy. Pulmonary arteries are grossly well opacified.  Mediastinum/thyroid: No significant mediastinal, hilar, or axillary adenopathy. Largest subcarinal lymph node is 1.5 cm. The visualized portion of the thyroid gland has a normal appearance.  Lungs/Airways: There is an irregular, spiculated mass in the superior segment of the right lower lobe measuring 1.0 x 1.3 cm. No pleural effusions or infiltrates.  Chest wall/osseous structures: Question of previous sternal fracture versus artifact. No suspicious lytic or blastic lesions are  identified.  CT ABDOMEN AND PELVIS FINDINGS  Upper abdomen: No focal abnormality identified within the liver, spleen, pancreas, or adrenal glands. Gallbladder is present and is contracted.  Gastrointestinal tract: The stomach and small bowel loops are normal in appearance. The appendix is absent. Colonic loops have a normal appearance.  Pelvis: No free pelvic fluid. Prostatic calcifications. Distended urinary bladder.  Retroperitoneum: No retroperitoneal or mesenteric adenopathy.  Abdominal wall: Unremarkable.  Osseous structures: No suspicious lytic or blastic lesions are identified. Degenerative changes in the spine.  IMPRESSION: 1. Irregular 1.3 cm mass in the superior segment of right lower lobe, suspicious for malignancy. 2. No significant mediastinal or hilar adenopathy. 3. Possible old sternal fracture. No suspicious lytic or blastic lesions are identified.   Electronically Signed   By: Norva Pavlov M.D.   On: 12/27/2014 18:25   Ct Abdomen Pelvis W Contrast  12/27/2014   CLINICAL DATA:  Occult malignancy.  EXAM: CT CHEST, ABDOMEN, AND PELVIS WITH CONTRAST  TECHNIQUE: Multidetector CT imaging of the chest, abdomen and pelvis was performed following the standard protocol during bolus administration of intravenous contrast.  CONTRAST:  1 OMNIPAQUE IOHEXOL 300 MG/ML SOLN, 92mL OMNIPAQUE IOHEXOL 300 MG/ML SOLN  COMPARISON:  Chest x-ray 12/26/2014  FINDINGS: CT CHEST FINDINGS  Heart: Heart size is normal. Coronary artery calcifications are present. No pericardial effusion.  Vascular structures: There is atherosclerotic calcification of the thoracic aorta. No aneurysm or dissection. Normal arch anatomy. Pulmonary arteries are grossly well opacified.  Mediastinum/thyroid: No significant mediastinal, hilar, or axillary adenopathy. Largest subcarinal lymph node is 1.5 cm. The visualized portion of the thyroid gland has a normal appearance.  Lungs/Airways: There is an irregular, spiculated mass in the superior  segment of the right lower lobe measuring 1.0 x 1.3 cm. No pleural effusions or infiltrates.  Chest wall/osseous structures: Question of previous sternal fracture versus artifact. No suspicious lytic or blastic lesions are identified.  CT ABDOMEN AND PELVIS FINDINGS  Upper abdomen: No focal abnormality identified within the liver, spleen, pancreas, or adrenal glands. Gallbladder is present and is contracted.  Gastrointestinal tract: The stomach and small bowel loops are normal in appearance. The appendix is absent. Colonic loops have a normal appearance.  Pelvis: No free pelvic fluid. Prostatic calcifications. Distended urinary bladder.  Retroperitoneum: No retroperitoneal or mesenteric adenopathy.  Abdominal wall: Unremarkable.  Osseous structures: No suspicious lytic or blastic lesions are identified. Degenerative changes in the spine.  IMPRESSION: 1. Irregular 1.3 cm mass in the superior segment of right lower lobe, suspicious for malignancy. 2. No significant mediastinal or hilar adenopathy. 3. Possible old sternal fracture. No suspicious lytic or blastic lesions are identified.   Electronically Signed   By: Norva Pavlov M.D.   On: 12/27/2014 18:25   Leona Singleton, MD 12/28/2014, 10:26 AM PGY-3, [facility] Family Medicine FPTS Intern pager: [phone], text  pages welcome

## 2014-12-28 NOTE — Progress Notes (Signed)
Pt c/o headache. On-call provider  paged and order received.

## 2014-12-28 NOTE — Progress Notes (Signed)
Pt had bp 184/86 mm of hg, pt was agitated. Rechecked bp still  remains 174/86 mm of hg. On-call provider Shelba Flake, DO paged, and order received to decrease IVF order. Will monitor.

## 2014-12-29 ENCOUNTER — Inpatient Hospital Stay (HOSPITAL_COMMUNITY): Payer: Medicare Other

## 2014-12-29 LAB — COMPREHENSIVE METABOLIC PANEL
ALBUMIN: 3 g/dL — AB (ref 3.5–5.2)
ALK PHOS: 67 U/L (ref 39–117)
ALT: 50 U/L (ref 0–53)
ANION GAP: 7 (ref 5–15)
AST: 33 U/L (ref 0–37)
BILIRUBIN TOTAL: 0.8 mg/dL (ref 0.3–1.2)
BUN: 8 mg/dL (ref 6–23)
CO2: 23 mmol/L (ref 19–32)
Calcium: 10.6 mg/dL — ABNORMAL HIGH (ref 8.4–10.5)
Chloride: 110 mmol/L (ref 96–112)
Creatinine, Ser: 1.17 mg/dL (ref 0.50–1.35)
GFR calc non Af Amer: 57 mL/min — ABNORMAL LOW (ref >90)
GFR, EST AFRICAN AMERICAN: 66 mL/min — AB (ref >90)
Glucose, Bld: 87 mg/dL (ref 70–99)
POTASSIUM: 3.1 mmol/L — AB (ref 3.5–5.1)
SODIUM: 140 mmol/L (ref 135–145)
Total Protein: 6.4 g/dL (ref 6.0–8.3)

## 2014-12-29 LAB — CBC
HEMATOCRIT: 30.6 % — AB (ref 39.0–52.0)
Hemoglobin: 10.8 g/dL — ABNORMAL LOW (ref 13.0–17.0)
MCH: 33.8 pg (ref 26.0–34.0)
MCHC: 35.3 g/dL (ref 30.0–36.0)
MCV: 95.6 fL (ref 78.0–100.0)
Platelets: 168 10*3/uL (ref 150–400)
RBC: 3.2 MIL/uL — ABNORMAL LOW (ref 4.22–5.81)
RDW: 14.5 % (ref 11.5–15.5)
WBC: 5.9 10*3/uL (ref 4.0–10.5)

## 2014-12-29 LAB — MAGNESIUM: Magnesium: 1.6 mg/dL (ref 1.5–2.5)

## 2014-12-29 MED ORDER — FUROSEMIDE 10 MG/ML IJ SOLN
20.0000 mg | Freq: Once | INTRAMUSCULAR | Status: AC
  Administered 2014-12-29: 20 mg via INTRAVENOUS
  Filled 2014-12-29 (×2): qty 2

## 2014-12-29 MED ORDER — POTASSIUM CHLORIDE CRYS ER 20 MEQ PO TBCR
40.0000 meq | EXTENDED_RELEASE_TABLET | Freq: Two times a day (BID) | ORAL | Status: AC
  Administered 2014-12-29 (×2): 40 meq via ORAL
  Filled 2014-12-29 (×2): qty 2

## 2014-12-29 MED ORDER — TECHNETIUM TC 99M MEDRONATE IV KIT
25.0000 | PACK | Freq: Once | INTRAVENOUS | Status: AC | PRN
  Administered 2014-12-29: 25 via INTRAVENOUS

## 2014-12-29 NOTE — Progress Notes (Signed)
HO Hensel paged, patient BP 180/87 asked to call for intervention.

## 2014-12-29 NOTE — Progress Notes (Signed)
Patient ID: Curtis Ramos, male   DOB: 07/25/1932, 81 y.o.   MRN: [id number] Request received for CT guided rt lung mass bx on pt. Imaging studies were reviewed by Dr. Vernard Gambles [phone]). He recommends PET/CT and thoracic surgery consultation before proceeding with bx.

## 2014-12-29 NOTE — Progress Notes (Signed)
Family Medicine Teaching Service Daily Progress Note Intern Pager: [phone]  Patient name: Curtis Ramos Medical record number: [id number] Date of birth: [birth year] Age: 81 y.o. Gender: male  Primary Care Provider: Leonides Sake, MD Consultants: none Code Status: FULL (discussed on admission)  Pt Overview and Major Events to Date:  2/13: IVFs decreased to 100cc/h  Assessment and Plan: Curtis Ramos is a 81 y.o. male presenting with worsening generalized weakness, decreased PO, found to have elevated Calcium at 13.7 from PCP on 12/25/14, sent to ED. 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 Corrected Ca in ED 14.1>>12.3>11.4. CXR: with questionable opacity RUL. PSA: normal. CT abdomen/pelvis/chest: 1.3 mass RLL suspicious for malignancy  -Telemetry -VS per floor protocol -Daily CMET  -Will avoid hypercalcemic aggravating meds like thiazide diuretics -PT eval - Up and out of bed with assistance (being sedentary can aggravate hypercalcemia) -Will continue to volume expand with IVFs . -UOP overnight 113cc/h.  IVFs100cc/h with goal UOP of 100-150cc/h. Given mod-hyperCa goal to flush out Ca with IVF -Strict I/Os -Consider Lasix (expedite Ca removal, especially if appears volume overloaded after IVF hydration) vs Salmon calcitonin (can use this for 48 h). If we use salmon calcitonin will need to repeat calcium in 4-6 hours. May repeat Salmon calcitonin in 6-12 hours if found to be responsive to calcitonin -If concern for malignancy consider Zaledronic acid vs Glucocorticoids   -SPEP pending, FOBT ordered (RN will collect this today)  -CT chest, abdomen, pelvis. No lytic lesions, so ordered BONE SCAN instead of bone survey (eval for Paget's per Rads) -c/s IR: Curtis Vernard Gambles who suggested thoracic surg consult to possibly  remove the mass if pt good surgical candidate.  CT biopsy without contrast for now per Curtis Vernard Gambles rec.    -PET scan and c/s to CTS: Curtis Cyndia Bent will try and see today. -acid phosphatase ordered x2. Keeps being autocancelled by [employer].  Lab unsure why this is happening.    Hypokalemia: K 3.1>3.1.  Magnesium 1.6 -Kdur 40Meq PO x2  HTN: BP elevated to 387-564'P systolic -Hydralazine IV PRN SBP>180, DBP>100  AKI: RESOLVED. Cr 1.58 in ED. Cr 1.49 about 3 days ago at PCPs office. Unsure of baseline. Patient with adequate UOP. Today's Cr 1.17 -IVFs as above -Will avoid nephrotoxic agents  Weakness/Decreased energy: likely secondary to underlying metabolic process vs concern for occult malignancy  -fall precautions -c/s PT  Decreased oral intake:  -c/s to nutrition: appreciate recs  Anemia: hgb 10.8 this am.  Dilutional vs bleed? Seemingly asymptomatic at present -FOBT not obtained yet. Spoke to Rosine, RN this am.  He will get this collected with next BM. -Will consider transfusion if becomes symptomatic   Hurthle cell carcinoma (Micropapillary(follicular) carcinoma of thyroid): radiation in 2004? As there was risidual activity seen in neck after complete thyroidectomy.  Hypothyroidism d/t surgical resection: uses Synthroid 150 and 1100mcg alternating doses daily. Unsure of which he took this am. -Continue home medication regimen   Mycobacterium avium infection of R hand: on Azithromycin, ethambutol, rifampin outpatient, per last ID outpatient visit 12/05/14 recommend at least 6-8 weeks, may need prolonged course. See Curtis Ramos [phone].. Only received ethambutol today. Will need Azithro and Rifampin today.  -Called and LVM with Curtis Ramos. Awaiting return call. -Will continue these medications as directed.  Shingles: completed acyclovir outpatient, outbreak 3 weeks ago (R-head/face) -contact isolation  FEN/GI:  Normal diet, IVFs NS @100cc /h Prophylaxis: sub-q  heparin  Disposition: Discharge once hypercalcemia resolved and patient taking better PO.  Subjective:  Patient reports that he is feeling better than yesterday.  He is on his way out for his bone scan  Family members updated at bedside.  They voice good understanding of current plan.  Voice no concerns at this time.  Objective: Temp:  [98.5 F (36.9 C)-99.4 F (37.4 C)] 98.6 F (37 C) (02/14 0627) Pulse Rate:  [69] 69 (02/13 1452) Resp:  [14-18] 14 (02/14 0627) BP: (159-170)/(67-72) 170/72 mmHg (02/14 0627) SpO2:  [95 %-98 %] 98 % (02/14 0627) Weight:  [151 lb 10.8 oz (68.8 kg)] 151 lb 10.8 oz (68.8 kg) (02/14 0421)   I/O last 3 completed shifts: In: 6114.5 [P.O.:1147; I.V.:4967.5] Out: 5430 [Urine:5430] Total I/O In: -  Out: 750 [Urine:750]  Physical Exam: General: awake, eye closed lying in bed, NAD, Wife and other family members at bedside Cardiovascular: RRR, no murmurs Respiratory: CTAB, no increased WOB Abdomen: soft, NT/ND Extremities: thin, WWP, no LE edema Skin: R forehead with dry, healing lesions Psych: slowed speech (same as previous days), follows commands, appears happier today than previous days  Laboratory:  Recent Labs Lab 12/27/14 0548 12/28/14 0510 12/29/14 0515  WBC 8.0 6.5 5.9  HGB 11.6* 10.9* 10.8*  HCT 33.3* 30.6* 30.6*  PLT 206 199 168    Recent Labs Lab 12/27/14 0548 12/28/14 0510 12/29/14 0515  NA 139 140 140  K 3.5 3.1* 3.1*  CL 108 108 110  CO2 24 24 23   BUN 17 11 8   CREATININE 1.56* 1.19 1.17  CALCIUM 11.8* 10.0 10.6*  PROT 6.7 6.1 6.4  BILITOT 0.8 0.7 0.8  ALKPHOS 72 70 67  ALT 45 45 50  AST 33 33 33  GLUCOSE 101* 90 87   2/11: Corrected Ca 14.1 2/12: Corrected Ca 12.3 2/14: Corrected Ca 11.4 SPEP pending PSA normal  Imaging/Diagnostic Tests: Ct Chest W Contrast  12/27/2014   CLINICAL DATA:  Occult malignancy.  EXAM: CT CHEST, ABDOMEN, AND PELVIS WITH CONTRAST  TECHNIQUE: Multidetector CT imaging of the chest,  abdomen and pelvis was performed following the standard protocol during bolus administration of intravenous contrast.  CONTRAST:  1 OMNIPAQUE IOHEXOL 300 MG/ML SOLN, 68mL OMNIPAQUE IOHEXOL 300 MG/ML SOLN  COMPARISON:  Chest x-ray 12/26/2014  FINDINGS: CT CHEST FINDINGS  Heart: Heart size is normal. Coronary artery calcifications are present. No pericardial effusion.  Vascular structures: There is atherosclerotic calcification of the thoracic aorta. No aneurysm or dissection. Normal arch anatomy. Pulmonary arteries are grossly well opacified.  Mediastinum/thyroid: No significant mediastinal, hilar, or axillary adenopathy. Largest subcarinal lymph node is 1.5 cm. The visualized portion of the thyroid gland has a normal appearance.  Lungs/Airways: There is an irregular, spiculated mass in the superior segment of the right lower lobe measuring 1.0 x 1.3 cm. No pleural effusions or infiltrates.  Chest wall/osseous structures: Question of previous sternal fracture versus artifact. No suspicious lytic or blastic lesions are identified.  CT ABDOMEN AND PELVIS FINDINGS  Upper abdomen: No focal abnormality identified within the liver, spleen, pancreas, or adrenal glands. Gallbladder is present and is contracted.  Gastrointestinal tract: The stomach and small bowel loops are normal in appearance. The appendix is absent. Colonic loops have a normal appearance.  Pelvis: No free pelvic fluid. Prostatic calcifications. Distended urinary bladder.  Retroperitoneum: No retroperitoneal or mesenteric adenopathy.  Abdominal wall: Unremarkable.  Osseous structures: No suspicious lytic or blastic lesions are  identified. Degenerative changes in the spine.  IMPRESSION: 1. Irregular 1.3 cm mass in the superior segment of right lower lobe, suspicious for malignancy. 2. No significant mediastinal or hilar adenopathy. 3. Possible old sternal fracture. No suspicious lytic or blastic lesions are identified.   Electronically Signed   By: Nolon Nations M.D.   On: 12/27/2014 18:25   Ct Abdomen Pelvis W Contrast  12/27/2014   CLINICAL DATA:  Occult malignancy.  EXAM: CT CHEST, ABDOMEN, AND PELVIS WITH CONTRAST  TECHNIQUE: Multidetector CT imaging of the chest, abdomen and pelvis was performed following the standard protocol during bolus administration of intravenous contrast.  CONTRAST:  1 OMNIPAQUE IOHEXOL 300 MG/ML SOLN, 68mL OMNIPAQUE IOHEXOL 300 MG/ML SOLN  COMPARISON:  Chest x-ray 12/26/2014  FINDINGS: CT CHEST FINDINGS  Heart: Heart size is normal. Coronary artery calcifications are present. No pericardial effusion.  Vascular structures: There is atherosclerotic calcification of the thoracic aorta. No aneurysm or dissection. Normal arch anatomy. Pulmonary arteries are grossly well opacified.  Mediastinum/thyroid: No significant mediastinal, hilar, or axillary adenopathy. Largest subcarinal lymph node is 1.5 cm. The visualized portion of the thyroid gland has a normal appearance.  Lungs/Airways: There is an irregular, spiculated mass in the superior segment of the right lower lobe measuring 1.0 x 1.3 cm. No pleural effusions or infiltrates.  Chest wall/osseous structures: Question of previous sternal fracture versus artifact. No suspicious lytic or blastic lesions are identified.  CT ABDOMEN AND PELVIS FINDINGS  Upper abdomen: No focal abnormality identified within the liver, spleen, pancreas, or adrenal glands. Gallbladder is present and is contracted.  Gastrointestinal tract: The stomach and small bowel loops are normal in appearance. The appendix is absent. Colonic loops have a normal appearance.  Pelvis: No free pelvic fluid. Prostatic calcifications. Distended urinary bladder.  Retroperitoneum: No retroperitoneal or mesenteric adenopathy.  Abdominal wall: Unremarkable.  Osseous structures: No suspicious lytic or blastic lesions are identified. Degenerative changes in the spine.  IMPRESSION: 1. Irregular 1.3 cm mass in the superior segment of  right lower lobe, suspicious for malignancy. 2. No significant mediastinal or hilar adenopathy. 3. Possible old sternal fracture. No suspicious lytic or blastic lesions are identified.   Electronically Signed   By: Nolon Nations M.D.   On: 12/27/2014 18:25   Janora Norlander, DO 12/29/2014, 1:21 PM PGY-1, [place] Intern pager: [phone], text pages welcome

## 2014-12-29 NOTE — Consult Note (Signed)
[address]       [place] [address]             [phone]      Cardiothoracic Surgery Consultation   Reason for Consult: lung nodule and hypercalcemia Referring Physician: Ronnie Doss, DO  Curtis Ramos is an 81 y.o. male.  HPI:   The patient is an 81 year old lifelong nonsmoker with hypothyroidism s/p total thyroidectomy and RAI treatment for Hurthle cell carcinoma around 2003. He reports feeling well until he had surgery on his right hand in October 2015 for an enlarging knot. This was removed and grew MAC. He was seen by Dr. Megan Salon and started on 3 drug antibiotic therapy. Within a few weeks he had myalgias and arthralgias and the drugs were stopped temporarily with resolution of his symptoms. The drugs were then restarted one at a time and he tolerated all 3. Then several weeks ago he began having right sided headaches and pain in the right eye and was diagnosed with shingles. He was admitted on 2/11 with a one week history of worsening generalized weakness, poor po intake and was found to have hypercalcemia with a calcium of 13.7. He had a CT of the chest, abdomen and pelvis that showed a 1.3 x 1.0 cm irregular nodule in the superior segment of the RLL suspicious for malignancy. There was no mediastinal or hilar adenopathy. The abdominal and pelvic CT was unremarkable. He had a bone scan that showed some multifocal degenerative uptake but no evidence of osseous metastatic disease. His calcium has come down with hydration.  Past Medical History  Diagnosis Date  . Hypothyroidism   . Wears glasses   . PONV (postoperative nausea and vomiting)   . Shingles   . Mycobacterium avium complex   . GERD (gastroesophageal reflux disease)   . Arthritis     "proabably"  . Cancer     family unaware of this hx on 12/26/2014    Past Surgical History  Procedure Laterality Date  . Thyroidectomy  2003  . Appendectomy    . Colonoscopy    . Mass excision Right  08/27/2014    Procedure: EXCISION MASS RIGHT PALM/INDEX;  Surgeon: Leanora Cover, MD;  Location: [facility];  Service: Orthopedics;  Laterality: Right;  . Inguinal hernia repair Left   . Cataract extraction w/ intraocular lens  implant, bilateral Bilateral     History reviewed. No pertinent family history.  Social History:  reports that he has never smoked. He has never used smokeless tobacco. He reports that he does not drink alcohol or use illicit drugs.  Allergies: No Known Allergies  Medications:  I have reviewed the patient's current medications. Prior to Admission:  Prescriptions prior to admission  Medication Sig Dispense Refill Last Dose  . azithromycin (ZITHROMAX) 250 MG tablet Take 1 tablet (250 mg total) by mouth daily. (Patient taking differently: Take 250 mg by mouth daily. Cont.) 30 tablet 11 12/26/2014 at Unknown time  . Cod Liver Oil 1000 MG CAPS Take by mouth.   12/26/2014 at Unknown time  . cycloSPORINE (RESTASIS) 0.05 % ophthalmic emulsion Place 1 drop into the right eye 2 (two) times daily.   12/25/2014 at Unknown time  . ethambutol (MYAMBUTOL) 400 MG tablet Take 3 tablets (1,200 mg total) by mouth daily. 90 tablet 11 12/26/2014 at Unknown time  . levothyroxine (SYNTHROID, LEVOTHROID) 150 MCG tablet Take 150 mcg by mouth daily before breakfast. Alternated 175,150 every  other day   12/26/2014 at Unknown time  . loteprednol (LOTEMAX) 0.5 % ophthalmic suspension Place 1 drop into the right eye 2 (two) times daily.   12/26/2014 at Unknown time  . Multiple Vitamins-Minerals (ONE-A-DAY VITACRAVES IMMUNITY) CHEW Chew 2 tablets by mouth daily.   12/26/2014 at Unknown time  . oxyCODONE-acetaminophen (PERCOCET/ROXICET) 5-325 MG per tablet Take 1 tablet by mouth every 4 (four) hours as needed for severe pain.   Past Week at Unknown time  . rifampin (RIFADIN) 300 MG capsule Take 2 capsules (600 mg total) by mouth daily. 60 capsule 11 12/26/2014 at Unknown time  . zinc gluconate  50 MG tablet Take 50 mg by mouth daily.   12/26/2014 at Unknown time   Scheduled: . azithromycin  250 mg Oral Daily  . cycloSPORINE  1 drop Right Eye BID  . ethambutol  1,200 mg Oral Daily  . feeding supplement (ENSURE COMPLETE)  237 mL Oral BID BM  . feeding supplement (RESOURCE BREEZE)  1 Container Oral Q24H  . heparin  5,000 Units Subcutaneous 3 times per day  . levothyroxine  150 mcg Oral Q48H  . levothyroxine  175 mcg Oral Q48H  . loteprednol  1 drop Right Eye BID  . potassium chloride  40 mEq Oral BID  . rifampin  600 mg Oral Daily  . sodium chloride  3 mL Intravenous Q12H   Continuous: . sodium chloride 100 mL/hr at 12/28/14 2147   DGL:OVFIEPPIRJJOA, hydrALAZINE, ondansetron **OR** ondansetron (ZOFRAN) IV, polyethylene glycol Anti-infectives    Start     Dose/Rate Route Frequency Ordered Stop   12/27/14 1000  azithromycin (ZITHROMAX) tablet 250 mg     250 mg Oral Daily 12/26/14 1939     12/27/14 1000  ethambutol (MYAMBUTOL) tablet 1,200 mg     1,200 mg Oral Daily 12/26/14 1939     12/27/14 1000  rifampin (RIFADIN) capsule 600 mg     600 mg Oral Daily 12/26/14 1939        Results for orders placed or performed during the hospital encounter of 12/26/14 (from the past 48 hour(s))  Comprehensive metabolic panel     Status: Abnormal   Collection Time: 12/28/14  5:10 AM  Result Value Ref Range   Sodium 140 135 - 145 mmol/L   Potassium 3.1 (L) 3.5 - 5.1 mmol/L   Chloride 108 96 - 112 mmol/L   CO2 24 19 - 32 mmol/L   Glucose, Bld 90 70 - 99 mg/dL   BUN 11 6 - 23 mg/dL   Creatinine, Ser 1.19 0.50 - 1.35 mg/dL   Calcium 10.0 8.4 - 10.5 mg/dL   Total Protein 6.1 6.0 - 8.3 g/dL   Albumin 3.0 (L) 3.5 - 5.2 g/dL   AST 33 0 - 37 U/L   ALT 45 0 - 53 U/L   Alkaline Phosphatase 70 39 - 117 U/L   Total Bilirubin 0.7 0.3 - 1.2 mg/dL   GFR calc non Af Amer 56 (L) >90 mL/min   GFR calc Af Amer 65 (L) >90 mL/min    Comment: (NOTE) The eGFR has been calculated using the CKD EPI  equation. This calculation has not been validated in all clinical situations. eGFR's persistently <90 mL/min signify possible Chronic Kidney Disease.    Anion gap 8 5 - 15  CBC     Status: Abnormal   Collection Time: 12/28/14  5:10 AM  Result Value Ref Range   WBC 6.5 4.0 - 10.5 K/uL   RBC  3.19 (L) 4.22 - 5.81 MIL/uL   Hemoglobin 10.9 (L) 13.0 - 17.0 g/dL   HCT 30.6 (L) 39.0 - 52.0 %   MCV 95.9 78.0 - 100.0 fL   MCH 34.2 (H) 26.0 - 34.0 pg   MCHC 35.6 30.0 - 36.0 g/dL   RDW 14.5 11.5 - 15.5 %   Platelets 199 150 - 400 K/uL  CBC     Status: Abnormal   Collection Time: 12/29/14  5:15 AM  Result Value Ref Range   WBC 5.9 4.0 - 10.5 K/uL   RBC 3.20 (L) 4.22 - 5.81 MIL/uL   Hemoglobin 10.8 (L) 13.0 - 17.0 g/dL   HCT 30.6 (L) 39.0 - 52.0 %   MCV 95.6 78.0 - 100.0 fL   MCH 33.8 26.0 - 34.0 pg   MCHC 35.3 30.0 - 36.0 g/dL   RDW 14.5 11.5 - 15.5 %   Platelets 168 150 - 400 K/uL  Comprehensive metabolic panel     Status: Abnormal   Collection Time: 12/29/14  5:15 AM  Result Value Ref Range   Sodium 140 135 - 145 mmol/L   Potassium 3.1 (L) 3.5 - 5.1 mmol/L   Chloride 110 96 - 112 mmol/L   CO2 23 19 - 32 mmol/L   Glucose, Bld 87 70 - 99 mg/dL   BUN 8 6 - 23 mg/dL   Creatinine, Ser 1.17 0.50 - 1.35 mg/dL   Calcium 10.6 (H) 8.4 - 10.5 mg/dL   Total Protein 6.4 6.0 - 8.3 g/dL   Albumin 3.0 (L) 3.5 - 5.2 g/dL   AST 33 0 - 37 U/L   ALT 50 0 - 53 U/L   Alkaline Phosphatase 67 39 - 117 U/L   Total Bilirubin 0.8 0.3 - 1.2 mg/dL   GFR calc non Af Amer 57 (L) >90 mL/min   GFR calc Af Amer 66 (L) >90 mL/min    Comment: (NOTE) The eGFR has been calculated using the CKD EPI equation. This calculation has not been validated in all clinical situations. eGFR's persistently <90 mL/min signify possible Chronic Kidney Disease.    Anion gap 7 5 - 15  Magnesium     Status: None   Collection Time: 12/29/14  9:30 AM  Result Value Ref Range   Magnesium 1.6 1.5 - 2.5 mg/dL    Ct Chest W  Contrast  12/27/2014   CLINICAL DATA:  Occult malignancy.  EXAM: CT CHEST, ABDOMEN, AND PELVIS WITH CONTRAST  TECHNIQUE: Multidetector CT imaging of the chest, abdomen and pelvis was performed following the standard protocol during bolus administration of intravenous contrast.  CONTRAST:  1 OMNIPAQUE IOHEXOL 300 MG/ML SOLN, 10m OMNIPAQUE IOHEXOL 300 MG/ML SOLN  COMPARISON:  Chest x-ray 12/26/2014  FINDINGS: CT CHEST FINDINGS  Heart: Heart size is normal. Coronary artery calcifications are present. No pericardial effusion.  Vascular structures: There is atherosclerotic calcification of the thoracic aorta. No aneurysm or dissection. Normal arch anatomy. Pulmonary arteries are grossly well opacified.  Mediastinum/thyroid: No significant mediastinal, hilar, or axillary adenopathy. Largest subcarinal lymph node is 1.5 cm. The visualized portion of the thyroid gland has a normal appearance.  Lungs/Airways: There is an irregular, spiculated mass in the superior segment of the right lower lobe measuring 1.0 x 1.3 cm. No pleural effusions or infiltrates.  Chest wall/osseous structures: Question of previous sternal fracture versus artifact. No suspicious lytic or blastic lesions are identified.  CT ABDOMEN AND PELVIS FINDINGS  Upper abdomen: No focal abnormality identified within the liver, spleen,  pancreas, or adrenal glands. Gallbladder is present and is contracted.  Gastrointestinal tract: The stomach and small bowel loops are normal in appearance. The appendix is absent. Colonic loops have a normal appearance.  Pelvis: No free pelvic fluid. Prostatic calcifications. Distended urinary bladder.  Retroperitoneum: No retroperitoneal or mesenteric adenopathy.  Abdominal wall: Unremarkable.  Osseous structures: No suspicious lytic or blastic lesions are identified. Degenerative changes in the spine.  IMPRESSION: 1. Irregular 1.3 cm mass in the superior segment of right lower lobe, suspicious for malignancy. 2. No significant  mediastinal or hilar adenopathy. 3. Possible old sternal fracture. No suspicious lytic or blastic lesions are identified.   Electronically Signed   By: Nolon Nations M.D.   On: 12/27/2014 18:25   Nm Bone Scan Whole Body  12/29/2014   CLINICAL DATA:  Newly diagnosed thyroid carcinoma.  Hypercalcemia.  EXAM: NUCLEAR MEDICINE WHOLE BODY BONE SCAN  TECHNIQUE: Whole body anterior and posterior images were obtained approximately 3 hours after intravenous injection of radiopharmaceutical.  RADIOPHARMACEUTICALS:  25.0 mCi Technetium-99 MDP  COMPARISON:  None  FINDINGS: Multi focal degenerative uptake is seen within the left cervical facet joints, bilateral acromioclavicular joints, lower lumbar spine, bilateral wrists, and left knee.  A solitary focus of uptake is also seen in the right anterior sixth rib, which is most likely posttraumatic in etiology.  No other sites of abnormal osseous activity are seen to suggest presence of bone metastases.  IMPRESSION: No definite evidence of osseous metastatic disease.  Multifocal degenerative uptake, and probable posttraumatic uptake in the right anterior sixth rib.   Electronically Signed   By: Earle Gell M.D.   On: 12/29/2014 14:01   Ct Abdomen Pelvis W Contrast  12/27/2014   CLINICAL DATA:  Occult malignancy.  EXAM: CT CHEST, ABDOMEN, AND PELVIS WITH CONTRAST  TECHNIQUE: Multidetector CT imaging of the chest, abdomen and pelvis was performed following the standard protocol during bolus administration of intravenous contrast.  CONTRAST:  1 OMNIPAQUE IOHEXOL 300 MG/ML SOLN, 64m OMNIPAQUE IOHEXOL 300 MG/ML SOLN  COMPARISON:  Chest x-ray 12/26/2014  FINDINGS: CT CHEST FINDINGS  Heart: Heart size is normal. Coronary artery calcifications are present. No pericardial effusion.  Vascular structures: There is atherosclerotic calcification of the thoracic aorta. No aneurysm or dissection. Normal arch anatomy. Pulmonary arteries are grossly well opacified.  Mediastinum/thyroid:  No significant mediastinal, hilar, or axillary adenopathy. Largest subcarinal lymph node is 1.5 cm. The visualized portion of the thyroid gland has a normal appearance.  Lungs/Airways: There is an irregular, spiculated mass in the superior segment of the right lower lobe measuring 1.0 x 1.3 cm. No pleural effusions or infiltrates.  Chest wall/osseous structures: Question of previous sternal fracture versus artifact. No suspicious lytic or blastic lesions are identified.  CT ABDOMEN AND PELVIS FINDINGS  Upper abdomen: No focal abnormality identified within the liver, spleen, pancreas, or adrenal glands. Gallbladder is present and is contracted.  Gastrointestinal tract: The stomach and small bowel loops are normal in appearance. The appendix is absent. Colonic loops have a normal appearance.  Pelvis: No free pelvic fluid. Prostatic calcifications. Distended urinary bladder.  Retroperitoneum: No retroperitoneal or mesenteric adenopathy.  Abdominal wall: Unremarkable.  Osseous structures: No suspicious lytic or blastic lesions are identified. Degenerative changes in the spine.  IMPRESSION: 1. Irregular 1.3 cm mass in the superior segment of right lower lobe, suspicious for malignancy. 2. No significant mediastinal or hilar adenopathy. 3. Possible old sternal fracture. No suspicious lytic or blastic lesions are identified.   Electronically  Signed   By: Nolon Nations M.D.   On: 12/27/2014 18:25    Review of Systems  Constitutional: Positive for malaise/fatigue. Negative for fever, chills and weight loss.  HENT:       Pain right side of face around eye and cheek  Eyes: Positive for pain.       Right side  Respiratory: Negative for cough, hemoptysis, sputum production and shortness of breath.   Cardiovascular: Negative for chest pain, palpitations, orthopnea and leg swelling.  Gastrointestinal: Negative.   Genitourinary: Negative.   Musculoskeletal: Negative.   Skin: Negative.   Neurological: Positive for  headaches.  Endo/Heme/Allergies: Negative.   Psychiatric/Behavioral: Negative.    Blood pressure 180/87, pulse 74, temperature 98.4 F (36.9 C), temperature source Oral, resp. rate 18, height 6' (1.829 m), weight 68.8 kg (151 lb 10.8 oz), SpO2 97 %. Physical Exam  Constitutional: He is oriented to person, place, and time. He appears well-developed and well-nourished. No distress.  HENT:  Head: Normocephalic and atraumatic.  Mouth/Throat: Oropharynx is clear and moist.  Eyes: EOM are normal. Pupils are equal, round, and reactive to light.  Neck: No JVD present. No tracheal deviation present.  Cardiovascular: Normal rate, regular rhythm and normal heart sounds.   No murmur heard. Respiratory: Effort normal and breath sounds normal. No respiratory distress. He has no wheezes. He has no rales. He exhibits no tenderness.  GI: Soft. Bowel sounds are normal. He exhibits no mass. There is no tenderness.  Musculoskeletal: He exhibits no edema.  Lymphadenopathy:    He has no cervical adenopathy.  Neurological: He is alert and oriented to person, place, and time.  Skin: Skin is warm and dry.  Psychiatric: He has a normal mood and affect.   CLINICAL DATA: Occult malignancy.  EXAM: CT CHEST, ABDOMEN, AND PELVIS WITH CONTRAST  TECHNIQUE: Multidetector CT imaging of the chest, abdomen and pelvis was performed following the standard protocol during bolus administration of intravenous contrast.  CONTRAST: 1 OMNIPAQUE IOHEXOL 300 MG/ML SOLN, 84m OMNIPAQUE IOHEXOL 300 MG/ML SOLN  COMPARISON: Chest x-ray 12/26/2014  FINDINGS: CT CHEST FINDINGS  Heart: Heart size is normal. Coronary artery calcifications are present. No pericardial effusion.  Vascular structures: There is atherosclerotic calcification of the thoracic aorta. No aneurysm or dissection. Normal arch anatomy. Pulmonary arteries are grossly well opacified.  Mediastinum/thyroid: No significant mediastinal, hilar, or  axillary adenopathy. Largest subcarinal lymph node is 1.5 cm. The visualized portion of the thyroid gland has a normal appearance.  Lungs/Airways: There is an irregular, spiculated mass in the superior segment of the right lower lobe measuring 1.0 x 1.3 cm. No pleural effusions or infiltrates.  Chest wall/osseous structures: Question of previous sternal fracture versus artifact. No suspicious lytic or blastic lesions are identified.  CT ABDOMEN AND PELVIS FINDINGS  Upper abdomen: No focal abnormality identified within the liver, spleen, pancreas, or adrenal glands. Gallbladder is present and is contracted.  Gastrointestinal tract: The stomach and small bowel loops are normal in appearance. The appendix is absent. Colonic loops have a normal appearance.  Pelvis: No free pelvic fluid. Prostatic calcifications. Distended urinary bladder.  Retroperitoneum: No retroperitoneal or mesenteric adenopathy.  Abdominal wall: Unremarkable.  Osseous structures: No suspicious lytic or blastic lesions are identified. Degenerative changes in the spine.  IMPRESSION: 1. Irregular 1.3 cm mass in the superior segment of right lower lobe, suspicious for malignancy. 2. No significant mediastinal or hilar adenopathy. 3. Possible old sternal fracture. No suspicious lytic or blastic lesions are identified.  Electronically Signed  By: Nolon Nations M.D.  On: 12/27/2014 18:25   Assessment/Plan:  He has a 1.3 cm irregular nodule in the RLL that is suspicious for a lung cancer but certainly it could be a benign nodule in this nonsmoker. He presented with hypercalcemia which can be associated with malignancy, particularly lung cancer, but is not very common in my experience. The hypercalcemia is probably more likely due to something else. If this is a small lung cancer, and it is causing his hypercalcemia it would have to be due to secretion of parathyroid-related protein or rarely  parathormone itself. I think the bone scan has ruled out osteolytic mets. Tumors can secrete 1,25 dihydroxy-vitamin D but usually not lung cancers. I think it would be worthwhile checking serum PTH and PTH-related protein levels whether this is related to a lung cancer or not because that will help you decide what may be causing his hypercalcemia. I think a CT-guided needle biopsy of the lung lesion will have a low yield given its small size and certainly should not be done until the lesion is worked up with a PET scan, which should only be done as an outpatient. It the lesion has hypermetabolic activity on PET scan I would decide if he is a candidate for surgical resection based on how he is doing and his PFT's. I will be happy to follow up this lung lesion in my office and continue its work-up. He will need continued medical follow up for his hypercalcemia.  Damica Gravlin K 12/29/2014, 5:55 PM

## 2014-12-29 NOTE — Progress Notes (Signed)
PT Cancellation Note  Patient Details Name: Curtis Ramos MRN: [id number] DOB: Mar 26, 1932   Cancelled Treatment:    Reason Eval/Treat Not Completed: PT screened, no needs identified, will sign off PT evaluation initially ordered and followed up 2/12. Patient and family declined PT and stated he has been ambulatory in halls throughout the day with family. Second PT evaluation ordered and again patient declines PT services. States he is still ambulating in halls with family and has not had any issues. Discussed with patient importance of being mobile while in hospital and he verbalizes understanding.Will sign off at this time. Thank you for this referral.  Please re-order for any significant change in patient's status.  Ellouise Newer 12/29/2014, 1:47 PM Elayne Snare, [place]

## 2014-12-29 NOTE — Discharge Summary (Signed)
[facility] Discharge Summary  Patient name: Curtis Ramos Medical record number: [id number] Date of birth: [birth year] Age: 81 y.o. Gender: male Date of Admission: 12/26/2014  Date of Discharge: 12/31/14 Admitting Physician: Zigmund Gottron, MD  Primary Care Provider: Leonides Sake, MD Consultants: cardiothoracic surgery, interventional radiology  Indication for Hospitalization: Hypercalcemia  Discharge Diagnoses/Problem List:  Hypercalcemia AKI Decreased oral intake Lung mass Hypothyroidism associated with a surgical procedure Atypical mycobacterium infection  Disposition: Discharge home with close follow up with PCP and endocrinologist  Discharge Condition: Stable  Discharge Exam:  BP 147/83 mmHg  Pulse 78  Temp(Src) 98.5 F (36.9 C) (Oral)  Resp 20  Ht 6' (1.829 m)  Wt 148 lb 2.4 oz (67.2 kg)  BMI 20.09 kg/m2  SpO2 96%   General: awake,lying in bed, NAD, no family at bedside this morning ENT: EOMI, (keeps eyes closed most of the time d/t light sensitivity) but no conjunctival injection, PERRLA, MMM Cardiovascular: RRR, no murmurs Respiratory: CTAB, no increased WOB Abdomen: soft, NT/ND Extremities: thin, WWP, no LE edema Skin: R forehead with dry, healing lesions Psych: speech normal, appears happier today than previous days Neuro: able to stand without assistance, follows commands  Brief Hospital Course:  Curtis Ramos is a 81 y.o. male that presented with worsening generalized weakness, decreased PO, found to have elevated Calcium at 13.7 from PCP on 12/25/14, sent to ED. 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  Patient given 1L bolus of IVFs.  On exam, patient with slowed speech and seemingly confused.  Per wife, he had been having visual hallucinations.  He was admitted  to the [facility] Medicine Teaching service and placed on telemetry.  Albumin 3.5. Cr 1.58. PTH 39.  PSA WNL.  Given recent TSH and vitamin D labs, these were not reordered.  Patient was aggressively hydrated with IVFs @150 -200cc/hr in order to maintain UOP at 100-150cc/h.  His calcium was monitored and corrected calcium calculated daily.  Salmon calcitonin and Zaledronic acid were considered as adjunctive treatments to reduce calcium.  However, patient responded well to IVFs alone.  On HD#2, he was found to be hypertensive to systolic pressures of 892-119'E.  He was treated with IV Hydralazine and IV Lasix 20mg  x1, to which he responded well.  His fluids were backed down to 100cc/h.  Patient's kidney function, calcium level and mentation gradually improved.  In the setting of a mid-normal range PTH, it was thought that this COULD be a primary hyperparathyroidism.  However, it was unclear.  Therefore, evaluation for possible malignancy in the setting of prior thyroid cancer was pursued.  CT chest, abdomen/pelvis was remarkable for a 1.3cm irregular mass in the RLL that was highly suspicious for malignancy.  As there were no obvious lytic lesions on CT scan, a bone scan was ordered.  Bone scan did not reveal any osseous metastatic disease.  Interventional radiology was consulted to biopsy this, who asked that the patient first be evaluated by cardiothoracic surgery.  CTS did not feel strongly that the lung lesion was the source of the hypercalcemia, as it is apparently rare that lung cancers cause these type of findings.  Therefore, a PET scan was recommended to further work up the possibility of malignancy.  PTHrp was also recommended and obtained.  SPEP basically normal.  UPEP obtained and still pending.  By HD#5, patient's  calcium level was WNL.  He was tried off of IVFs for a day and calcium was evaluated the following morning.  He proved to remain stable off of fluids with a corrected calcium 11.4.  He was no longer  symptomatic.  He was tolerating PO well and ambulating independently.  He was discharged home with his wife and son in stable condition after close follow up was made with his PCP, endocrinologist and outpatient PET scan was scheduled.  On HD#2 patient also with hypokalemia to 3.1.  Magnesium 1.6.  He was monitored and repleted as appropriate.  He responded well to this.    Patient also noted to have healing zoster lesions on face.  He was continued on home eye drops for this and recommended to schedule with his eye doctor upon discharge from hospital.  We attempted to reach patient's ID provider Dr Megan Salon several times during hospitalization but were unsuccessful in reaching him.  It was found that in RARE instances Rifampin can cause elevation in calcium.  This should be kept in mind going forward.  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  Issues for Follow Up:  1. Repeat CMET (f/u Calcium and Potassium level) 2. Blood pressure.  BP elevated in hospital 3. PET scan results 4. Follow up with Dr Cyndia Bent after PET scan for continued monitoring of Lung mass 5. Consider further parathyroid evaluation, PTHrp & Ionized calcium pending  Significant Procedures: Bone scan  Significant Labs and Imaging:   Recent Labs Lab 12/27/14 0548 12/28/14 0510 12/29/14 0515  WBC 8.0 6.5 5.9  HGB 11.6* 10.9* 10.8*  HCT 33.3* 30.6* 30.6*  PLT 206 199 168    Recent Labs Lab 12/27/14 0548 12/28/14 0510 12/29/14 0515 12/29/14 0930 12/30/14 0850 12/31/14 0530  NA 139 140 140  --  140 141  K 3.5 3.1* 3.1*  --  3.4* 2.9*  CL 108 108 110  --  110 108  CO2 24 24 23   --  25 20  GLUCOSE 101* 90 87  --  101* 94  BUN 17 11 8   --  8 10  CREATININE 1.56* 1.19 1.17  --  1.15 1.19  CALCIUM 11.8* 10.0 10.6*  --  10.8* 10.9*  MG  --   --   --  1.6  --   --   ALKPHOS 72 70 67  --  76 74  AST 33 33 33  --  40* 39*  ALT 45 45 50  --   62* 67*  ALBUMIN 3.4* 3.0* 3.0*  --  3.5 3.5   HIV Ab: negative Magnesium: 1.6 PTH: 39 PSA: 2.17 Free PSA: 0.33   Protein electrophoresis, serum  Status: Finalresult Visible to patient:  Not Released Nextappt: 01/03/2015 at 10:00 AM in Radiology ([facility] PET)            Ref Range 5d ago    Total Protein ELP 6.0 - 8.3 g/dL 6.9   Albumin ELP 55.8 - 66.1 % 52.5 (L)   Alpha-1-Globulin 2.9 - 4.9 % 4.0   Alpha-2-Globulin 7.1 - 11.8 % 9.4   Beta Globulin 4.7 - 7.2 % 5.7   Beta 2 3.2 - 6.5 % 8.9 (H)   Gamma Globulin 11.1 - 18.8 % 19.5 (H)   M-Spike, % g/dL 0.36   SPE Interp.  (NOTE)   Comments: A restricted band consistent with monoclonal protein is present.  Suggest serum IFE to confirm, if clinically indicated. (  Lab will hold  sample one week. Please call Customer Service at [phone] to add  test.)  The monoclonal protein peak accounts for 0.36 g/dL of the total  1.35 g/dL of protein in the gamma region.  Reviewed by Odis Hollingshead, MD, PhD, FCAP (Electronic Signature on  File)           Ct Chest W Contrast  12/27/2014   CLINICAL DATA:  Occult malignancy.  EXAM: CT CHEST, ABDOMEN, AND PELVIS WITH CONTRAST  TECHNIQUE: Multidetector CT imaging of the chest, abdomen and pelvis was performed following the standard protocol during bolus administration of intravenous contrast.  CONTRAST:  1 OMNIPAQUE IOHEXOL 300 MG/ML SOLN, 48mL OMNIPAQUE IOHEXOL 300 MG/ML SOLN  COMPARISON:  Chest x-ray 12/26/2014  FINDINGS: CT CHEST FINDINGS  Heart: Heart size is normal. Coronary artery calcifications are present. No pericardial effusion.  Vascular structures: There is atherosclerotic calcification of the thoracic aorta. No aneurysm or dissection. Normal arch anatomy. Pulmonary arteries are grossly well opacified.  Mediastinum/thyroid: No significant mediastinal, hilar, or axillary adenopathy. Largest subcarinal lymph node is 1.5 cm. The visualized portion of the thyroid  gland has a normal appearance.  Lungs/Airways: There is an irregular, spiculated mass in the superior segment of the right lower lobe measuring 1.0 x 1.3 cm. No pleural effusions or infiltrates.  Chest wall/osseous structures: Question of previous sternal fracture versus artifact. No suspicious lytic or blastic lesions are identified.  CT ABDOMEN AND PELVIS FINDINGS  Upper abdomen: No focal abnormality identified within the liver, spleen, pancreas, or adrenal glands. Gallbladder is present and is contracted.  Gastrointestinal tract: The stomach and small bowel loops are normal in appearance. The appendix is absent. Colonic loops have a normal appearance.  Pelvis: No free pelvic fluid. Prostatic calcifications. Distended urinary bladder.  Retroperitoneum: No retroperitoneal or mesenteric adenopathy.  Abdominal wall: Unremarkable.  Osseous structures: No suspicious lytic or blastic lesions are identified. Degenerative changes in the spine.  IMPRESSION: 1. Irregular 1.3 cm mass in the superior segment of right lower lobe, suspicious for malignancy. 2. No significant mediastinal or hilar adenopathy. 3. Possible old sternal fracture. No suspicious lytic or blastic lesions are identified.   Electronically Signed   By: Nolon Nations M.D.   On: 12/27/2014 18:25   Nm Bone Scan Whole Body  12/29/2014   CLINICAL DATA:  Newly diagnosed thyroid carcinoma.  Hypercalcemia.  EXAM: NUCLEAR MEDICINE WHOLE BODY BONE SCAN  TECHNIQUE: Whole body anterior and posterior images were obtained approximately 3 hours after intravenous injection of radiopharmaceutical.  RADIOPHARMACEUTICALS:  25.0 mCi Technetium-99 MDP  COMPARISON:  None  FINDINGS: Multi focal degenerative uptake is seen within the left cervical facet joints, bilateral acromioclavicular joints, lower lumbar spine, bilateral wrists, and left knee.  A solitary focus of uptake is also seen in the right anterior sixth rib, which is most likely posttraumatic in etiology.  No  other sites of abnormal osseous activity are seen to suggest presence of bone metastases.  IMPRESSION: No definite evidence of osseous metastatic disease.  Multifocal degenerative uptake, and probable posttraumatic uptake in the right anterior sixth rib.   Electronically Signed   By: Earle Gell M.D.   On: 12/29/2014 14:01   Ct Abdomen Pelvis W Contrast  12/27/2014   CLINICAL DATA:  Occult malignancy.  EXAM: CT CHEST, ABDOMEN, AND PELVIS WITH CONTRAST  TECHNIQUE: Multidetector CT imaging of the chest, abdomen and pelvis was performed following the standard protocol during bolus administration of intravenous contrast.  CONTRAST:  1 OMNIPAQUE  IOHEXOL 300 MG/ML SOLN, 35mL OMNIPAQUE IOHEXOL 300 MG/ML SOLN  COMPARISON:  Chest x-ray 12/26/2014  FINDINGS: CT CHEST FINDINGS  Heart: Heart size is normal. Coronary artery calcifications are present. No pericardial effusion.  Vascular structures: There is atherosclerotic calcification of the thoracic aorta. No aneurysm or dissection. Normal arch anatomy. Pulmonary arteries are grossly well opacified.  Mediastinum/thyroid: No significant mediastinal, hilar, or axillary adenopathy. Largest subcarinal lymph node is 1.5 cm. The visualized portion of the thyroid gland has a normal appearance.  Lungs/Airways: There is an irregular, spiculated mass in the superior segment of the right lower lobe measuring 1.0 x 1.3 cm. No pleural effusions or infiltrates.  Chest wall/osseous structures: Question of previous sternal fracture versus artifact. No suspicious lytic or blastic lesions are identified.  CT ABDOMEN AND PELVIS FINDINGS  Upper abdomen: No focal abnormality identified within the liver, spleen, pancreas, or adrenal glands. Gallbladder is present and is contracted.  Gastrointestinal tract: The stomach and small bowel loops are normal in appearance. The appendix is absent. Colonic loops have a normal appearance.  Pelvis: No free pelvic fluid. Prostatic calcifications. Distended  urinary bladder.  Retroperitoneum: No retroperitoneal or mesenteric adenopathy.  Abdominal wall: Unremarkable.  Osseous structures: No suspicious lytic or blastic lesions are identified. Degenerative changes in the spine.  IMPRESSION: 1. Irregular 1.3 cm mass in the superior segment of right lower lobe, suspicious for malignancy. 2. No significant mediastinal or hilar adenopathy. 3. Possible old sternal fracture. No suspicious lytic or blastic lesions are identified.   Electronically Signed   By: Nolon Nations M.D.   On: 12/27/2014 18:25    Results/Tests Pending at Time of Discharge: PTHrp, Ionized Calcium, UPEP  Discharge Medications:    Medication List    STOP taking these medications        Cod Liver Oil 1000 MG Caps      TAKE these medications        azithromycin 250 MG tablet  Commonly known as:  ZITHROMAX  Take 1 tablet (250 mg total) by mouth daily.     cycloSPORINE 0.05 % ophthalmic emulsion  Commonly known as:  RESTASIS  Place 1 drop into the right eye 2 (two) times daily.     ethambutol 400 MG tablet  Commonly known as:  MYAMBUTOL  Take 3 tablets (1,200 mg total) by mouth daily.     levothyroxine 150 MCG tablet  Commonly known as:  SYNTHROID, LEVOTHROID  Take 150 mcg by mouth daily before breakfast. Alternated 175,150 every other day     loteprednol 0.5 % ophthalmic suspension  Commonly known as:  LOTEMAX  Place 1 drop into the right eye 2 (two) times daily.     ONE-A-DAY VITACRAVES IMMUNITY Chew  Chew 2 tablets by mouth daily.     oxyCODONE-acetaminophen 5-325 MG per tablet  Commonly known as:  PERCOCET/ROXICET  Take 1 tablet by mouth every 4 (four) hours as needed for severe pain.     rifampin 300 MG capsule  Commonly known as:  RIFADIN  Take 2 capsules (600 mg total) by mouth daily.     zinc gluconate 50 MG tablet  Take 50 mg by mouth daily.        Discharge Instructions: Please refer to Patient Instructions section of EMR for full details.   Patient was counseled important signs and symptoms that should prompt return to medical care, changes in medications, dietary instructions, activity restrictions, and follow up appointments.   Follow-Up Appointments: Follow-up Information    Follow  up with Limmie Patricia, MD. Go on 01/07/2015.   Specialty:  Endocrinology   Why:  8:20am [facility] follow up for hypercalcemia)   Contact information:   [place] [address] [phone]       Follow up with [facility], MD. Go on 01/01/2015.   Specialty:  Family Medicine   Why:  3:30 pm (hospital follow up)   Contact information:   Dr. Daiva Eves [place] [address] [phone]       Follow up with [facility]. Go on 01/03/2015.   Why:  10:00 am (Please arrive 9:45 am.  Nothing to eat or drink after midnight on night before procedure)   Contact information:   [place] [address] [phone]      Follow up with Michel Bickers, MD.   Specialty:  Infectious Diseases   Contact information:   [place] [address] [phone]       Follow up with Gaye Pollack, MD.   Specialty:  Cardiothoracic Surgery   Why:  after PET scan   Contact information:   [address] [place] [address] [phone]       Ashly M Gottschalk, DO 12/31/2014, 3:18 PM PGY-1, [place]

## 2014-12-30 LAB — PROTEIN ELECTROPHORESIS, SERUM
ALPHA-1-GLOBULIN: 4 % (ref 2.9–4.9)
Albumin ELP: 52.5 % — ABNORMAL LOW (ref 55.8–66.1)
Alpha-2-Globulin: 9.4 % (ref 7.1–11.8)
BETA 2: 8.9 % — AB (ref 3.2–6.5)
Beta Globulin: 5.7 % (ref 4.7–7.2)
Gamma Globulin: 19.5 % — ABNORMAL HIGH (ref 11.1–18.8)
M-SPIKE, %: 0.36 g/dL
TOTAL PROTEIN ELP: 6.9 g/dL (ref 6.0–8.3)

## 2014-12-30 LAB — COMPREHENSIVE METABOLIC PANEL
ALK PHOS: 76 U/L (ref 39–117)
ALT: 62 U/L — ABNORMAL HIGH (ref 0–53)
AST: 40 U/L — ABNORMAL HIGH (ref 0–37)
Albumin: 3.5 g/dL (ref 3.5–5.2)
Anion gap: 5 (ref 5–15)
BUN: 8 mg/dL (ref 6–23)
CALCIUM: 10.8 mg/dL — AB (ref 8.4–10.5)
CO2: 25 mmol/L (ref 19–32)
Chloride: 110 mmol/L (ref 96–112)
Creatinine, Ser: 1.15 mg/dL (ref 0.50–1.35)
GFR, EST AFRICAN AMERICAN: 67 mL/min — AB (ref >90)
GFR, EST NON AFRICAN AMERICAN: 58 mL/min — AB (ref >90)
Glucose, Bld: 101 mg/dL — ABNORMAL HIGH (ref 70–99)
POTASSIUM: 3.4 mmol/L — AB (ref 3.5–5.1)
SODIUM: 140 mmol/L (ref 135–145)
Total Bilirubin: 0.6 mg/dL (ref 0.3–1.2)
Total Protein: 7.3 g/dL (ref 6.0–8.3)

## 2014-12-30 LAB — HIV ANTIBODY (ROUTINE TESTING W REFLEX): HIV SCREEN 4TH GENERATION: NONREACTIVE

## 2014-12-30 MED ORDER — FLUTICASONE PROPIONATE 50 MCG/ACT NA SUSP
1.0000 | Freq: Every day | NASAL | Status: DC
  Administered 2014-12-30 – 2014-12-31 (×2): 1 via NASAL
  Filled 2014-12-30: qty 16

## 2014-12-30 MED ORDER — FUROSEMIDE 10 MG/ML IJ SOLN
20.0000 mg | Freq: Every day | INTRAMUSCULAR | Status: DC
  Administered 2014-12-30: 20 mg via INTRAVENOUS
  Filled 2014-12-30: qty 2

## 2014-12-30 MED ORDER — HYPROMELLOSE (GONIOSCOPIC) 2.5 % OP SOLN
1.0000 [drp] | Freq: Three times a day (TID) | OPHTHALMIC | Status: DC
  Administered 2014-12-30 – 2014-12-31 (×4): 1 [drp] via OPHTHALMIC
  Filled 2014-12-30: qty 15

## 2014-12-30 NOTE — Progress Notes (Signed)
Family Medicine Teaching Service Daily Progress Note Intern Pager: [phone]  Patient name: Curtis Ramos Medical record number: [id number] Date of birth: 1932/01/07 Age: 81 y.o. Gender: male  Primary Care Provider: Leonides Sake, MD Consultants: none Code Status: FULL (discussed on admission)  Pt Overview and Major Events to Date:  2/13: IVFs decreased to 100cc/h  Assessment and Plan: Curtis Ramos is a 81 y.o. male presenting with worsening generalized weakness, decreased PO, found to have elevated Calcium at 13.7 from PCP on 12/25/14, sent to ED. 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 Corrected Ca in ED 14.1>>12.3>11.4>>11.2. CXR: with questionable opacity RUL. PSA: normal. CT abdomen/pelvis/chest: 1.3 mass RLL suspicious for malignancy  -Telemetry -VS per floor protocol -Daily CMET  -Will avoid hypercalcemic aggravating meds like thiazide diuretics -PT eval - Up and out of bed with assistance (being sedentary can aggravate hypercalcemia) -Will continue to volume expand with IVFs . -UOP overnight 75cc/h. Will discontinue IVFs and see how patient responds -Strict I/Os -SPEP pending, FOBT ordered -CT chest, abdomen, pelvis. No lytic lesions, so ordered BONE SCAN instead of bone survey (eval for Paget's per Rads) -c/s IR: Dr Vernard Gambles who suggested thoracic surg consult to possibly remove the mass if pt good surgical candidate.  CT biopsy without contrast for now per Dr Vernard Gambles rec.    -PET scan   -c/s to CTS: Dr Cyndia Bent does not feel that Bx would be high yield at this time.  Recommends evaluating further with PET and ordering PTH-related peptide -acid phosphatase ordered x2. Keeps being autocancelled by [employer].  Lab unsure why this is happening.   -Will also c/s Endocrine today.  Hypokalemia: K 3.1>3.1>3.4.  Magnesium  1.6 -Monitor and replete PRN  HTN: BP elevated to 381-829'H systolic -s/p 1x Lasix 20 IV -Hydralazine IV PRN SBP>180, DBP>100  AKI: RESOLVED. Cr 1.58 in ED. Cr 1.49 about 3 days ago at PCPs office. Unsure of baseline. Patient with adequate UOP. Today's Cr 1.15 -IVFs as above -Will avoid nephrotoxic agents  Weakness/Decreased energy: likely secondary to underlying metabolic process vs concern for occult malignancy  -fall precautions -c/s PT  Decreased oral intake:  -c/s to nutrition: appreciate recs  Anemia: hgb 10.8 this am.  Dilutional vs bleed? Seemingly asymptomatic at present -FOBT not obtained yet. Spoke to [place], RN this am.  He will get this collected with next BM. -Will consider transfusion if becomes symptomatic   Hurthle cell carcinoma (Micropapillary(follicular) carcinoma of thyroid): radiation in 2004? As there was risidual activity seen in neck after complete thyroidectomy.  Hypothyroidism d/t surgical resection: uses Synthroid 150 and 132mcg alternating doses daily. Unsure of which he took this am. -Continue home medication regimen   Mycobacterium avium infection of R hand: on Azithromycin, ethambutol, rifampin outpatient, per last ID outpatient visit 12/05/14 recommend at least 6-8 weeks, may need prolonged course. See Dr Michel Bickers [phone].. Only received ethambutol today. Will need Azithro and Rifampin today.  -Called and LVM with Dr Megan Salon.Attempted to call again.  Have left VM on numbers listed in last note.  Office closed d/t inclement weather. -Will continue these medications as directed.  Shingles: completed acyclovir outpatient, outbreak 3 weeks ago (R-head/face) -contact isolation  FEN/GI: Normal diet, SLIV Prophylaxis: sub-q heparin  Disposition: Discharge once hypercalcemia resolved and patient taking better PO.  Subjective:  Patient reports that he is feeling well this morning.  He denies any concerns at this time.  He reports that he  has been up walking the hall.  He is eager to be discharged from the hospital.  He understands the current plan and agrees to proceed as scheduled.  Objective: Temp:  [98.4 F (36.9 C)-99.1 F (37.3 C)] 98.5 F (36.9 C) (02/15 0538) Pulse Rate:  [72-79] 79 (02/15 0538) Resp:  [15-18] 17 (02/15 0538) BP: (151-183)/(67-91) 156/67 mmHg (02/15 0538) SpO2:  [92 %-98 %] 92 % (02/15 0538) Weight:  [149 lb 7.6 oz (67.8 kg)] 149 lb 7.6 oz (67.8 kg) (02/15 0539)   I/O last 3 completed shifts: In: 4556.7 [P.O.:750; I.V.:3806.7] Out: 3775 [Urine:3775] Total I/O In: 240 [P.O.:240] Out: -   Physical Exam: General: awake,sitting up in bed, NAD, no family at bedside this morning Cardiovascular: RRR, no murmurs Respiratory: CTAB, no increased WOB Abdomen: soft, NT/ND Extremities: thin, WWP, no LE edema Skin: R forehead with dry, healing lesions Psych: slowed speech (improved compared to previous days), follows commands, appears happier today than previous days  Laboratory:  Recent Labs Lab 12/27/14 0548 12/28/14 0510 12/29/14 0515  WBC 8.0 6.5 5.9  HGB 11.6* 10.9* 10.8*  HCT 33.3* 30.6* 30.6*  PLT 206 199 168    Recent Labs Lab 12/28/14 0510 12/29/14 0515 12/30/14 0850  NA 140 140 140  K 3.1* 3.1* 3.4*  CL 108 110 110  CO2 24 23 25   BUN 11 8 8   CREATININE 1.19 1.17 1.15  CALCIUM 10.0 10.6* 10.8*  PROT 6.1 6.4 7.3  BILITOT 0.7 0.8 0.6  ALKPHOS 70 67 76  ALT 45 50 62*  AST 33 33 40*  GLUCOSE 90 87 101*   Corrected Ca 14.1> 12.3>11.4>11.2 SPEP pending PSA normal PTH-related peptide pending  Imaging/Diagnostic Tests: Nm Bone Scan Whole Body  12/29/2014   CLINICAL DATA:  Newly diagnosed thyroid carcinoma.  Hypercalcemia.  EXAM: NUCLEAR MEDICINE WHOLE BODY BONE SCAN  TECHNIQUE: Whole body anterior and posterior images were obtained approximately 3 hours after intravenous injection of radiopharmaceutical.  RADIOPHARMACEUTICALS:  25.0 mCi Technetium-99 MDP  COMPARISON:   None  FINDINGS: Multi focal degenerative uptake is seen within the left cervical facet joints, bilateral acromioclavicular joints, lower lumbar spine, bilateral wrists, and left knee.  A solitary focus of uptake is also seen in the right anterior sixth rib, which is most likely posttraumatic in etiology.  No other sites of abnormal osseous activity are seen to suggest presence of bone metastases.  IMPRESSION: No definite evidence of osseous metastatic disease.  Multifocal degenerative uptake, and probable posttraumatic uptake in the right anterior sixth rib.   Electronically Signed   By: Earle Gell M.D.   On: 12/29/2014 14:01   Janora Norlander, DO 12/30/2014, 1:10 PM PGY-1, [place] Intern pager: [phone], text pages welcome

## 2014-12-30 NOTE — Progress Notes (Signed)
Medicare Important Message given?  YES (If response is "NO", the following Medicare IM given date fields will be blank) Date Medicare IM given:  12/30/14 Medicare IM given by:  Maelee Hoot 

## 2014-12-30 NOTE — Progress Notes (Signed)
**  Interval Note**  Spoke to Dr Altheimer regarding patient's hospitalization.  He notes that patient in the mid normal range for PTH can sometimes have a hyperparathyroid.  He notes that patient will likely require a multidisciplinary approach.  I reviewed current plan.  He agrees with current course of medical management and states that he would be happy to follow the patient outpatient for this as well.    Kecia Swoboda M. Lajuana Ripple, DO PGY-1, [place]

## 2014-12-31 LAB — COMPREHENSIVE METABOLIC PANEL
ALK PHOS: 74 U/L (ref 39–117)
ALT: 67 U/L — AB (ref 0–53)
AST: 39 U/L — AB (ref 0–37)
Albumin: 3.5 g/dL (ref 3.5–5.2)
Anion gap: 13 (ref 5–15)
BUN: 10 mg/dL (ref 6–23)
CALCIUM: 10.9 mg/dL — AB (ref 8.4–10.5)
CO2: 20 mmol/L (ref 19–32)
Chloride: 108 mmol/L (ref 96–112)
Creatinine, Ser: 1.19 mg/dL (ref 0.50–1.35)
GFR calc Af Amer: 65 mL/min — ABNORMAL LOW (ref >90)
GFR calc non Af Amer: 56 mL/min — ABNORMAL LOW (ref >90)
Glucose, Bld: 94 mg/dL (ref 70–99)
POTASSIUM: 2.9 mmol/L — AB (ref 3.5–5.1)
SODIUM: 141 mmol/L (ref 135–145)
TOTAL PROTEIN: 6.9 g/dL (ref 6.0–8.3)
Total Bilirubin: 0.8 mg/dL (ref 0.3–1.2)

## 2014-12-31 LAB — GLUCOSE, CAPILLARY: GLUCOSE-CAPILLARY: 93 mg/dL (ref 70–99)

## 2014-12-31 MED ORDER — POTASSIUM CHLORIDE CRYS ER 20 MEQ PO TBCR
40.0000 meq | EXTENDED_RELEASE_TABLET | Freq: Three times a day (TID) | ORAL | Status: DC
  Administered 2014-12-31 (×2): 40 meq via ORAL
  Filled 2014-12-31 (×2): qty 2

## 2014-12-31 MED ORDER — POTASSIUM CHLORIDE CRYS ER 20 MEQ PO TBCR: 40.0000 meq | EXTENDED_RELEASE_TABLET | Freq: Two times a day (BID) | ORAL | Status: DC

## 2014-12-31 MED ORDER — POTASSIUM CHLORIDE CRYS ER 20 MEQ PO TBCR: 40.0000 meq | EXTENDED_RELEASE_TABLET | Freq: Three times a day (TID) | ORAL | Status: DC

## 2014-12-31 NOTE — Progress Notes (Signed)
Family Medicine Teaching Service Daily Progress Note Intern Pager: [phone]  Patient name: Curtis Ramos Medical record number: [id number] Date of birth: 1932/05/21 Age: 81 y.o. Gender: male  Primary Care Provider: Leonides Sake, MD Consultants: none Code Status: FULL (discussed on admission)  Pt Overview and Major Events to Date:  2/13: IVFs decreased to 100cc/h 2/15 IVFs stopped  Assessment and Plan: Curtis Ramos is a 81 y.o. male presenting with worsening generalized weakness, decreased PO, found to have elevated Calcium at 13.7 from PCP on 12/25/14, sent to ED. 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 Corrected Ca in ED 14.1>12.3>11.4>>11.2>11.3. CXR: with questionable opacity RUL. PSA: normal. CT abdomen/pelvis/chest: 1.3 mass RLL suspicious for malignancy  -Telemetry -VS per floor protocol -Daily CMET  -Will avoid hypercalcemic aggravating meds like thiazide diuretics -PT eval - Up and out of bed with assistance (being sedentary can aggravate hypercalcemia) -Will continue to volume expand with IVFs . -4 unrecorded voids overnight. -Strict I/Os - FOBT ordered -CT chest, abdomen, pelvis. No lytic lesions, so ordered BONE SCAN instead of bone survey (eval for Paget's per Rads) -c/s IR: Dr Vernard Gambles who suggested thoracic surg consult to possibly remove the mass if pt good surgical candidate.  CT biopsy without contrast for now per Dr Vernard Gambles rec.    -PET scan outpatient  -c/s to CTS: Dr Cyndia Bent does not feel that Bx would be high yield at this time.  Recommends evaluating further with PET and ordering PTH-related peptide (pending) -acid phosphatase ordered x2. Keeps being autocancelled by [employer].  Lab unsure why this is happening.   -Pt's Endocrinologist will also continue to follow pt outpatient -free calcium, UPEP  pending  Hypokalemia: K 3.1>3.1>3.4>2.9.  Magnesium 1.6 -KDur 67meq x2 today -Monitor and replete PRN  Transaminitis: AST 39, ALT 67  HTN: BP elevated to 347-425'Z systolic -Hydralazine IV PRN SBP>180, DBP>100  AKI: RESOLVED. Cr 1.58 in ED. Cr 1.49 about 3 days ago at PCPs office. Unsure of baseline. Patient with adequate UOP. Today's Cr 1.19 -IVFs as above -Will avoid nephrotoxic agents  Weakness/Decreased energy: likely secondary to underlying metabolic process vs concern for occult malignancy  -fall precautions -c/s PT  Decreased oral intake:  -c/s to nutrition: appreciate recs  Anemia: hgb 10.8 yesterday.  Dilutional vs bleed? Seemingly asymptomatic at present -FOBT not obtained yet.  -Will consider transfusion if becomes symptomatic   Hurthle cell carcinoma (Micropapillary(follicular) carcinoma of thyroid): radiation in 2004? As there was risidual activity seen in neck after complete thyroidectomy.  Hypothyroidism d/t surgical resection: uses Synthroid 150 and 122mcg alternating doses daily. Unsure of which he took this am. -Continue home medication regimen   Mycobacterium avium infection of R hand: on Azithromycin, ethambutol, rifampin outpatient, per last ID outpatient visit 12/05/14 recommend at least 6-8 weeks, may need prolonged course. See Dr Michel Bickers [phone].. Only received ethambutol today. Will need Azithro and Rifampin today.  -Called and LVM with Dr Megan Salon.Attempted to call again.  Have left VM on numbers listed in last note.  Office closed d/t inclement weather. -Will continue these medications as directed.  Shingles: completed acyclovir outpatient, outbreak 3 weeks ago (R-head/face) -contact isolation  FEN/GI: Normal diet, SLIV Prophylaxis: sub-q heparin  Disposition: Discharge hopefully today with close follow up and once PET scan scheduled  Subjective:  Patient reports that he is doing well.  He denies any concerns at  this time.  Voices  appreciation that his endocrinologist was contacted.  Voiced good understanding of plan and is hopeful for discharge today.  Objective: Temp:  [98.5 F (36.9 C)-98.8 F (37.1 C)] 98.6 F (37 C) (02/16 0602) Pulse Rate:  [73-75] 73 (02/15 2154) Resp:  [15-20] 15 (02/16 0602) BP: (158-173)/(84-91) 158/91 mmHg (02/16 0602) SpO2:  [95 %-99 %] 95 % (02/16 0602) Weight:  [148 lb 2.4 oz (67.2 kg)] 148 lb 2.4 oz (67.2 kg) (02/16 0601)   I/O last 3 completed shifts: In: 3261.7 [P.O.:880; I.V.:2381.7] Out: 1160 [Urine:1160]    Physical Exam: General: awake,lying in bed, NAD, no family at bedside this morning Cardiovascular: RRR, no murmurs Respiratory: CTAB, no increased WOB Abdomen: soft, NT/ND Extremities: thin, WWP, no LE edema Skin: R forehead with dry, healing lesions Psych: speech normal,  appears happier today than previous days Neuro: able to stand without assistance, follows commands  Laboratory:  Recent Labs Lab 12/27/14 0548 12/28/14 0510 12/29/14 0515  WBC 8.0 6.5 5.9  HGB 11.6* 10.9* 10.8*  HCT 33.3* 30.6* 30.6*  PLT 206 199 168    Recent Labs Lab 12/29/14 0515 12/30/14 0850 12/31/14 0530  NA 140 140 141  K 3.1* 3.4* 2.9*  CL 110 110 108  CO2 23 25 20   BUN 8 8 10   CREATININE 1.17 1.15 1.19  CALCIUM 10.6* 10.8* 10.9*  PROT 6.4 7.3 6.9  BILITOT 0.8 0.6 0.8  ALKPHOS 67 76 74  ALT 50 62* 67*  AST 33 40* 39*  GLUCOSE 87 101* 94   Corrected Ca 14.1> 12.3>11.4>11.2>11.3 SPEP pending PSA normal PTH-related peptide pending Free calcium pending  Imaging/Diagnostic Tests: Nm Bone Scan Whole Body  12/29/2014   CLINICAL DATA:  Newly diagnosed thyroid carcinoma.  Hypercalcemia.  EXAM: NUCLEAR MEDICINE WHOLE BODY BONE SCAN  TECHNIQUE: Whole body anterior and posterior images were obtained approximately 3 hours after intravenous injection of radiopharmaceutical.  RADIOPHARMACEUTICALS:  25.0 mCi Technetium-99 MDP  COMPARISON:  None  FINDINGS: Multi focal  degenerative uptake is seen within the left cervical facet joints, bilateral acromioclavicular joints, lower lumbar spine, bilateral wrists, and left knee.  A solitary focus of uptake is also seen in the right anterior sixth rib, which is most likely posttraumatic in etiology.  No other sites of abnormal osseous activity are seen to suggest presence of bone metastases.  IMPRESSION: No definite evidence of osseous metastatic disease.  Multifocal degenerative uptake, and probable posttraumatic uptake in the right anterior sixth rib.   Electronically Signed   By: Earle Gell M.D.   On: 12/29/2014 14:01   Janora Norlander, DO 12/31/2014, 9:34 AM PGY-1, [place] Intern pager: [phone], text pages welcome

## 2014-12-31 NOTE — Discharge Instructions (Signed)
We are glad to see that you are doing better.  You were admitted for high calcium levels.  Several tests have been performed to evaluate the cause of this calcium level.  It is still unclear as to whether the abnormalities in your levels are due to hormones or due to malignancy.  You have had a CT scan that showed a nodule in your lung.  So far, there is NOT a high suspicion that your levels are from this nodule.  However, you have a PET scan scheduled at [facility] that will help your providers better evaluate this possibility.  In addition, you have been scheduled with close follow up with your Endocrinologist to continue following your hormone levels in addition to your Calcium.  Please make sure that you follow up as scheduled.  Thank you for allowing [place] to care for you at [facility].   Appointment with PCP, Dr Daiva Eves tomorrow 2/17 at 3:30pm  PET scan on Friday 01/03/15 at 10 am.  PLEASE MAKE SURE TO ARRIVE BY 9:45AM.  Also, NOTHING TO EAT AFTER MIDNIGHT THE NIGHT BEFORE SCAN.  Appointment with Endocrinology, Dr Altheimer Tuesday 01/07/15 at 8:20am  Make sure to see your Infectious disease doctor about your antibiotics as scheduled  If you have any of the symptoms that brought you to the Emergency Department, please seek immediate medical attention.

## 2014-12-31 NOTE — Care Management Note (Signed)
    Page 1 of 1   12/31/2014     4:03:46 PM CARE MANAGEMENT NOTE 12/31/2014  Patient:  Curtis Ramos, Curtis Ramos   Account Number:  [id number]  Date Initiated:  12/30/2014  Documentation initiated by:  Tomi Bamberger  Subjective/Objective Assessment:   dx hypercalcemia  admit- lives with spouse.     Action/Plan:   pt eval- no f/u- patient declined.   Anticipated DC Date:  12/31/2014   Anticipated DC Plan:  [place]  CM consult      Choice offered to / List presented to:             Status of service:  Completed, signed off Medicare Important Message given?  YES (If response is "NO", the following Medicare IM given date fields will be blank) Date Medicare IM given:  12/30/2014 Medicare IM given by:  Tomi Bamberger Date Additional Medicare IM given:   Additional Medicare IM given by:    Discharge Disposition:  HOME/SELF CARE  Per UR Regulation:  Reviewed for med. necessity/level of care/duration of stay  If discussed at [place] of Stay Meetings, dates discussed:    Comments:  12/30/14 [name], BSN patient lives with spouse, NCM will cont to follow for dc needs.

## 2014-12-31 NOTE — Progress Notes (Signed)
[name] to be D/C'd Home per MD order.  Discussed with the patient and all questions fully answered.    Medication List    STOP taking these medications        Cod Liver Oil 1000 MG Caps      TAKE these medications        azithromycin 250 MG tablet  Commonly known as:  ZITHROMAX  Take 1 tablet (250 mg total) by mouth daily.     cycloSPORINE 0.05 % ophthalmic emulsion  Commonly known as:  RESTASIS  Place 1 drop into the right eye 2 (two) times daily.     ethambutol 400 MG tablet  Commonly known as:  MYAMBUTOL  Take 3 tablets (1,200 mg total) by mouth daily.     levothyroxine 150 MCG tablet  Commonly known as:  SYNTHROID, LEVOTHROID  Take 150 mcg by mouth daily before breakfast. Alternated 175,150 every other day     loteprednol 0.5 % ophthalmic suspension  Commonly known as:  LOTEMAX  Place 1 drop into the right eye 2 (two) times daily.     ONE-A-DAY VITACRAVES IMMUNITY Chew  Chew 2 tablets by mouth daily.     oxyCODONE-acetaminophen 5-325 MG per tablet  Commonly known as:  PERCOCET/ROXICET  Take 1 tablet by mouth every 4 (four) hours as needed for severe pain.     rifampin 300 MG capsule  Commonly known as:  RIFADIN  Take 2 capsules (600 mg total) by mouth daily.     zinc gluconate 50 MG tablet  Take 50 mg by mouth daily.        VVS, Skin clean, dry and intact without evidence of skin break down, no evidence of skin tears noted. IV catheter discontinued intact. Site without signs and symptoms of complications. Dressing and pressure applied.  An After Visit Summary was printed and given to the patient.  D/c education completed with patient/family including follow up instructions, medication list, d/c activities limitations if indicated, with other d/c instructions as indicated by MD - patient able to verbalize understanding, all questions fully answered.   Patient instructed to return to ED, call 911, or call MD for any changes in condition.   Patient  escorted via [place], and D/C home via private auto.  Curtis Ramos F 12/31/2014 3:46 PM

## 2015-01-01 DIAGNOSIS — R918 Other nonspecific abnormal finding of lung field: Secondary | ICD-10-CM | POA: Diagnosis not present

## 2015-01-01 DIAGNOSIS — E876 Hypokalemia: Secondary | ICD-10-CM | POA: Diagnosis not present

## 2015-01-01 DIAGNOSIS — B0230 Zoster ocular disease, unspecified: Secondary | ICD-10-CM | POA: Diagnosis not present

## 2015-01-01 LAB — UIFE/LIGHT CHAINS/TP QN, 24-HR UR
ALBUMIN, U: DETECTED
ALPHA 1 UR: DETECTED — AB
Alpha 2, Urine: DETECTED — AB
BETA UR: DETECTED — AB
GAMMA UR: DETECTED — AB
Total Protein, Urine: 6 mg/dL (ref 5–25)

## 2015-01-01 LAB — CALCIUM, IONIZED: Calcium, Ion: 1.6 mmol/L — ABNORMAL HIGH (ref 1.12–1.32)

## 2015-01-03 ENCOUNTER — Ambulatory Visit (HOSPITAL_COMMUNITY): Payer: Medicare Other

## 2015-01-04 LAB — PTH-RELATED PEPTIDE: PTH-RELATED PEPTIDE: 16 pg/mL (ref 14–27)

## 2015-01-06 ENCOUNTER — Telehealth: Payer: Self-pay | Admitting: Family Medicine

## 2015-01-06 DIAGNOSIS — R918 Other nonspecific abnormal finding of lung field: Secondary | ICD-10-CM

## 2015-01-06 NOTE — Telephone Encounter (Signed)
Radiology also calling asking if orders for

## 2015-01-06 NOTE — Assessment & Plan Note (Signed)
Reorder pet scan

## 2015-01-06 NOTE — Telephone Encounter (Signed)
Son called because Dr. Andria Frames who was the doctor seeing his father in the hospital ordered a PET scan. The scan was canceled because the patient was still in the hospital. Son would like Dr. Andria Frames to put orders back in fore his father to have the PET scan done. jw

## 2015-01-06 NOTE — Telephone Encounter (Signed)
Done

## 2015-01-07 DIAGNOSIS — E890 Postprocedural hypothyroidism: Secondary | ICD-10-CM | POA: Diagnosis not present

## 2015-01-07 DIAGNOSIS — Z85850 Personal history of malignant neoplasm of thyroid: Secondary | ICD-10-CM | POA: Diagnosis not present

## 2015-01-07 DIAGNOSIS — C73 Malignant neoplasm of thyroid gland: Secondary | ICD-10-CM | POA: Diagnosis not present

## 2015-01-07 DIAGNOSIS — Z7689 Persons encountering health services in other specified circumstances: Secondary | ICD-10-CM | POA: Diagnosis not present

## 2015-01-08 ENCOUNTER — Other Ambulatory Visit (HOSPITAL_COMMUNITY): Payer: Self-pay | Admitting: Surgery

## 2015-01-08 ENCOUNTER — Encounter (HOSPITAL_COMMUNITY)
Admission: RE | Admit: 2015-01-08 | Discharge: 2015-01-08 | Disposition: A | Discharge status: Home / Self Care | Payer: Medicare Other | Source: Ambulatory Visit | Attending: Family Medicine | Admitting: Family Medicine

## 2015-01-08 DIAGNOSIS — R911 Solitary pulmonary nodule: Secondary | ICD-10-CM | POA: Insufficient documentation

## 2015-01-08 DIAGNOSIS — R918 Other nonspecific abnormal finding of lung field: Secondary | ICD-10-CM

## 2015-01-08 LAB — GLUCOSE, CAPILLARY: GLUCOSE-CAPILLARY: 105 mg/dL — AB (ref 70–99)

## 2015-01-08 MED ORDER — FLUDEOXYGLUCOSE F - 18 (FDG) INJECTION
7.6000 | Freq: Once | INTRAVENOUS | Status: AC | PRN
  Administered 2015-01-08: 7.6 via INTRAVENOUS

## 2015-01-09 DIAGNOSIS — B0233 Zoster keratitis: Secondary | ICD-10-CM | POA: Diagnosis not present

## 2015-01-15 ENCOUNTER — Ambulatory Visit (INDEPENDENT_AMBULATORY_CARE_PROVIDER_SITE_OTHER): Payer: Medicare Other | Admitting: Surgery

## 2015-01-15 ENCOUNTER — Encounter: Payer: Self-pay | Admitting: Surgery

## 2015-01-15 VITALS — BP 171/99 | HR 81 | Resp 16 | Ht 71.0 in | Wt 160.0 lb

## 2015-01-15 DIAGNOSIS — D381 Neoplasm of uncertain behavior of trachea, bronchus and lung: Secondary | ICD-10-CM

## 2015-01-15 NOTE — Progress Notes (Signed)
HPI:  Curtis Ramos returns today for follow up of a right lower lobe lung nodule found on a CT scan of the chest after recent admission for symptomatic hypercalcemia. His calcium on admission was 13.7. The medical team was concerned about whether his hypercalcemia could be due to this 1.3 cm lung nodule if it was a lung cancer. His calcium was reduced to 10.0 with hydration and was 10.9 at discharge. His intact PTH level was 39 which is normal to mildly elevated with a normal range of 15-65. His PTH-related protein was 16 which is normal ( 14-27). No further workup was performed in the hospital. A PET scan was done as an outpatient to further assess the lung nodule. This shows minimal hypermetabolic activity in the nodule that is within the benign range. No other areas of hypermetabolic activity were seen. 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 He had surgery on his right hand in October 2015 for an enlarging knot. This was removed and grew MAC. He was seen by Dr. Megan Salon and started on 3 drug antibiotic therapy. Within a few weeks he had myalgias and arthralgias and the drugs were stopped temporarily with resolution of his symptoms. The drugs were then restarted one at a time and he tolerated all 3. Then several weeks prior to admission he began having right sided headaches and pain in the right eye and was diagnosed with shingles. He was admitted on 2/11 with a one week history of worsening generalized weakness, poor po intake and was found to have hypercalcemia.  Current Outpatient Prescriptions  Medication Sig Dispense Refill  . azithromycin (ZITHROMAX) 250 MG tablet Take 1 tablet (250 mg total) by mouth daily. (Patient taking differently: Take 250 mg by mouth daily. Cont.) 30 tablet 11  . cycloSPORINE (RESTASIS) 0.05 % ophthalmic emulsion Place 1 drop into the right eye 2 (two) times daily.    Marland Kitchen ethambutol (MYAMBUTOL) 400 MG tablet Take 3 tablets (1,200 mg  total) by mouth daily. 90 tablet 11  . levothyroxine (SYNTHROID, LEVOTHROID) 175 MCG tablet Take 175 mcg by mouth daily before breakfast.    . loteprednol (LOTEMAX) 0.5 % ophthalmic suspension Place 1 drop into the right eye 2 (two) times daily.    . Multiple Vitamins-Minerals (ONE-A-DAY VITACRAVES IMMUNITY) CHEW Chew 2 tablets by mouth daily.    Marland Kitchen oxyCODONE-acetaminophen (PERCOCET/ROXICET) 5-325 MG per tablet Take 1 tablet by mouth every 4 (four) hours as needed for severe pain.    . pregabalin (LYRICA) 100 MG capsule Take 100 mg by mouth 3 (three) times daily.    . rifampin (RIFADIN) 300 MG capsule Take 2 capsules (600 mg total) by mouth daily. 60 capsule 11  . zinc gluconate 50 MG tablet Take 50 mg by mouth daily.     No current facility-administered medications for this visit.     Physical Exam:  BP 171/99 mmHg  Pulse 81  Resp 16  Ht 5\' 11"  (1.803 m)  Wt 160 lb (72.576 kg)  BMI 22.33 kg/m2  SpO2 98% He looks well Lungs are clear There is no cervical, supraclavicular, or axillary adenopathy.  Diagnostic Tests:  CLINICAL DATA: Initial Treatment strategy for 1.3 cm irregular right lower lobe Lung nodule.  EXAM: NUCLEAR MEDICINE PET SKULL BASE TO THIGH  TECHNIQUE: 7.6 mCi F-18 FDG was injected intravenously. Full-ring PET imaging was performed from the skull base to thigh after the radiotracer. CT data was obtained and  used for attenuation correction and anatomic localization.  FASTING BLOOD GLUCOSE: Value: 105 mg/dl  COMPARISON: CT chest from 12/27/2014  FINDINGS: NECK  No hypermetabolic lymph nodes in the neck.  CHEST  No evidence for hypermetabolic lymphadenopathy in the chest. The 13 mm nodule identified previously in the right lower lobe shows very low level FDG uptake with SUV max = 0.7.  ABDOMEN/PELVIS  No abnormal hypermetabolic activity within the liver, pancreas, adrenal glands, or spleen. No hypermetabolic lymph nodes in the abdomen  or pelvis.  Nonobstructing 2-3 mm stone identified in the interpolar left kidney. Atherosclerotic calcification noted in the abdominal aorta without aneurysm  SKELETON  No focal hypermetabolic activity to suggest skeletal metastasis.  IMPRESSION: 13 mm right lower lobe pulmonary nodule shows very low-level FDG uptake, within the benign range. Well differentiated or low-grade neoplasm can be poorly FDG avid and followup imaging may be indicated to ensure continued stability.   Electronically Signed  By: Misty Stanley M.D.  On: 01/08/2015 16:32   Impression:  The right lower lobe lung nodule has very low-level FDG uptake and is most likely benign although it could be a slowly growing, low-grade neoplasm. I think it is highly unlikely that it is the cause of his hypercalcemia. It will require a follow up CT scan in three months to reassess it for growth. He does need further workup for his hypercalcemia. Reviewing his hospital records his PTH level was mildly elevated suggesting the possibility of primary hyperparathyroidism or familial hypocalciuric hypercalcemia. He should have a 24 hr urinary calcium excretion measured to continue his workup. This can be done by his primary physician or endocrinologist.  Plan:  I will see him back in 3 months with a CT of the chest to follow up on the lung nodule.

## 2015-01-16 DIAGNOSIS — B0233 Zoster keratitis: Secondary | ICD-10-CM | POA: Diagnosis not present

## 2015-01-20 DIAGNOSIS — S66190D Other injury of flexor muscle, fascia and tendon of right index finger at wrist and hand level, subsequent encounter: Secondary | ICD-10-CM | POA: Diagnosis not present

## 2015-01-20 DIAGNOSIS — S52531D Colles' fracture of right radius, subsequent encounter for closed fracture with routine healing: Secondary | ICD-10-CM | POA: Diagnosis not present

## 2015-01-21 DIAGNOSIS — M6281 Muscle weakness (generalized): Secondary | ICD-10-CM | POA: Diagnosis not present

## 2015-01-21 DIAGNOSIS — D2111 Benign neoplasm of connective and other soft tissue of right upper limb, including shoulder: Secondary | ICD-10-CM | POA: Diagnosis not present

## 2015-01-21 DIAGNOSIS — M47812 Spondylosis without myelopathy or radiculopathy, cervical region: Secondary | ICD-10-CM | POA: Diagnosis not present

## 2015-01-21 DIAGNOSIS — M62431 Contracture of muscle, right forearm: Secondary | ICD-10-CM | POA: Diagnosis not present

## 2015-01-23 DIAGNOSIS — E890 Postprocedural hypothyroidism: Secondary | ICD-10-CM | POA: Diagnosis not present

## 2015-01-24 DIAGNOSIS — B0233 Zoster keratitis: Secondary | ICD-10-CM | POA: Diagnosis not present

## 2015-01-27 DIAGNOSIS — B0052 Herpesviral keratitis: Secondary | ICD-10-CM | POA: Diagnosis not present

## 2015-01-28 ENCOUNTER — Encounter: Payer: Self-pay | Admitting: Internal Medicine

## 2015-01-28 ENCOUNTER — Ambulatory Visit (INDEPENDENT_AMBULATORY_CARE_PROVIDER_SITE_OTHER): Payer: Medicare Other | Admitting: Internal Medicine

## 2015-01-28 VITALS — BP 164/89 | HR 76 | Temp 97.2°F | Wt 155.0 lb

## 2015-01-28 DIAGNOSIS — A319 Mycobacterial infection, unspecified: Secondary | ICD-10-CM | POA: Diagnosis not present

## 2015-01-28 NOTE — Progress Notes (Signed)
Patient ID: Curtis Ramos, male   DOB: [birth year], 81 y.o.   MRN: [id number]         [facility]  Patient Active Problem List   Diagnosis Date Noted  . Lung mass   . Occult malignancy   . Hypercalcemia 12/26/2014  . AKI (acute kidney injury) 12/26/2014  . Decreased oral intake 12/26/2014  . Myalgia 11/04/2014  . Arthralgia 11/04/2014  . Hypothyroidism associated with surgical procedure 10/07/2014  . DJD (degenerative joint disease) 10/07/2014  . Atypical mycobacterium infection     Patient's Medications  New Prescriptions   No medications on file  Previous Medications   AZITHROMYCIN (ZITHROMAX) 250 MG TABLET    Take 1 tablet (250 mg total) by mouth daily.   CYCLOSPORINE (RESTASIS) 0.05 % OPHTHALMIC EMULSION    Place 1 drop into the right eye 2 (two) times daily.   ETHAMBUTOL (MYAMBUTOL) 400 MG TABLET    Take 3 tablets (1,200 mg total) by mouth daily.   LEVOTHYROXINE (SYNTHROID, LEVOTHROID) 175 MCG TABLET    Take 175 mcg by mouth daily before breakfast.   LOTEPREDNOL (LOTEMAX) 0.5 % OPHTHALMIC SUSPENSION    Place 1 drop into the right eye 2 (two) times daily.   MULTIPLE VITAMINS-MINERALS (ONE-A-DAY VITACRAVES IMMUNITY) CHEW    Chew 2 tablets by mouth daily.   OXYCODONE-ACETAMINOPHEN (PERCOCET/ROXICET) 5-325 MG PER TABLET    Take 1 tablet by mouth every 4 (four) hours as needed for severe pain.   PREGABALIN (LYRICA) 100 MG CAPSULE    Take 100 mg by mouth 3 (three) times daily.   RIFAMPIN (RIFADIN) 300 MG CAPSULE    Take 2 capsules (600 mg total) by mouth daily.   ZINC GLUCONATE 50 MG TABLET    Take 50 mg by mouth daily.  Modified Medications   No medications on file  Discontinued Medications   No medications on file    Subjective: Curtis Ramos is in with his wife for his routine follow-up visit. He remains on azithromycin, ethambutol and rifampin for his Mycobacterium avium right hand tenosynovitis. He is not having any problems tolerating his  antibiotics and he feels like his hand is improving. He continues physical therapy. He is now completed 2-1/2 months of therapy. He was recently hospitalized with transient hypercalcemia that resolved with IV fluids. He was found to have a 13 mm right lower lobe lung mass. A PET scan showed only very low level uptake. A decision has been made to follow him with serial CT scans every 3 months.  Review of Systems: Pertinent items are noted in HPI.  Past Medical History  Diagnosis Date  . Hypothyroidism   . Wears glasses   . PONV (postoperative nausea and vomiting)   . Shingles   . Mycobacterium avium complex   . GERD (gastroesophageal reflux disease)   . Arthritis     "proabably"  . Cancer     family unaware of this hx on 12/26/2014    History  Substance Use Topics  . Smoking status: Never Smoker   . Smokeless tobacco: Never Used  . Alcohol Use: No    No family history on file.  No Known Allergies  Objective: Temp: 97.2 F (36.2 C) (03/15 1600) Temp Source: Oral (03/15 1600) BP: 164/89 mmHg (03/15 1600) Pulse Rate: 76 (03/15 1600)  General: He is in very good spirits Lungs: Clear Right hand: He still has restricted flexion but this is improving. There is no warmth, swelling, redness or pain  Assessment: He appears to be responding well to recent surgery and 3 drug antibiotic therapy for Mycobacterium avium infection. I think it is unlikely that the solitary lung nodule is due to Mycobacterium avium.  Plan: 1. Follow-up in 3 months to consider stopping therapy.   Michel Bickers, MD [facility] [place] [facility] [phone] pager   [phone] cell 01/28/2015, 4:38 PM

## 2015-02-06 DIAGNOSIS — B0230 Zoster ocular disease, unspecified: Secondary | ICD-10-CM | POA: Diagnosis not present

## 2015-02-19 DIAGNOSIS — M25641 Stiffness of right hand, not elsewhere classified: Secondary | ICD-10-CM | POA: Diagnosis not present

## 2015-02-19 DIAGNOSIS — D2111 Benign neoplasm of connective and other soft tissue of right upper limb, including shoulder: Secondary | ICD-10-CM | POA: Diagnosis not present

## 2015-02-19 DIAGNOSIS — S52531D Colles' fracture of right radius, subsequent encounter for closed fracture with routine healing: Secondary | ICD-10-CM | POA: Diagnosis not present

## 2015-02-25 DIAGNOSIS — E890 Postprocedural hypothyroidism: Secondary | ICD-10-CM | POA: Diagnosis not present

## 2015-03-11 IMAGING — CT NM PET TUM IMG INITIAL (PI) SKULL BASE T - THIGH
1 of 7 series · 1 of 25 positions shown · non-contrast
Comparison: CT chest from 12/27/2014

CLINICAL DATA: Initial Treatment strategy for 1.3 cm irregular
right lower lobe Lung nodule.

EXAM:
NUCLEAR MEDICINE PET SKULL BASE TO THIGH
TECHNIQUE: 7.6 mCi F-18 FDG was injected intravenously. Full-ring PET imaging
was performed from the skull base to thigh after the radiotracer. CT
data was obtained and used for attenuation correction and anatomic
localization.
FASTING BLOOD GLUCOSE:  Value: 105 mg/dl

[Series 4: ct sk_thigh 5.0 b31f · axial · 5.0mm · 0.98mm/px · 1 of 220 slices shown]
[im 220/220  brain]
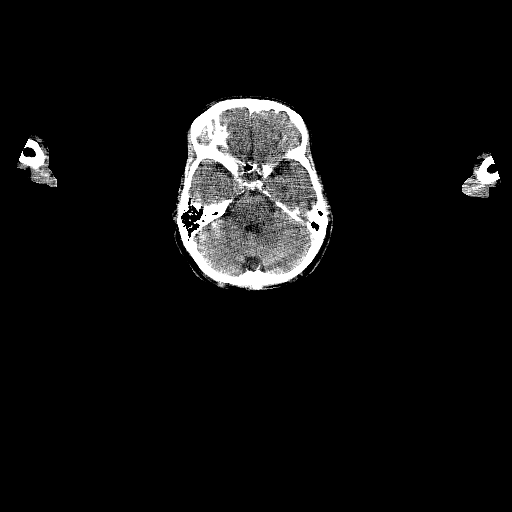

[1 of 25 positions shown; findings below may reference images not displayed]

FINDINGS: NECK

No hypermetabolic lymph nodes in the neck.

CHEST

No evidence for hypermetabolic lymphadenopathy in the chest. The 13
mm nodule identified previously in the right lower lobe shows very
low level FDG uptake with SUV max = 0.7.

ABDOMEN/PELVIS

No abnormal hypermetabolic activity within the liver, pancreas,
adrenal glands, or spleen. No hypermetabolic lymph nodes in the
abdomen or pelvis.

Nonobstructing 2-3 mm stone identified in the interpolar left
kidney. Atherosclerotic calcification noted in the abdominal aorta
without aneurysm

SKELETON

No focal hypermetabolic activity to suggest skeletal metastasis.
IMPRESSION: 13 mm right lower lobe pulmonary nodule shows very low-level FDG
uptake, within the benign range. Well differentiated or low-grade
neoplasm can be poorly FDG avid and followup imaging may be
indicated to ensure continued stability.

## 2015-03-13 DIAGNOSIS — B0230 Zoster ocular disease, unspecified: Secondary | ICD-10-CM | POA: Diagnosis not present

## 2015-03-17 DIAGNOSIS — B0229 Other postherpetic nervous system involvement: Secondary | ICD-10-CM | POA: Diagnosis not present

## 2015-03-25 DIAGNOSIS — E890 Postprocedural hypothyroidism: Secondary | ICD-10-CM | POA: Diagnosis not present

## 2015-04-17 ENCOUNTER — Other Ambulatory Visit: Payer: Self-pay | Admitting: Surgery

## 2015-04-17 DIAGNOSIS — R918 Other nonspecific abnormal finding of lung field: Secondary | ICD-10-CM

## 2015-04-18 ENCOUNTER — Other Ambulatory Visit: Payer: Self-pay | Admitting: Surgery

## 2015-04-18 DIAGNOSIS — R918 Other nonspecific abnormal finding of lung field: Secondary | ICD-10-CM

## 2015-04-25 ENCOUNTER — Other Ambulatory Visit: Payer: Self-pay | Admitting: *Deleted

## 2015-04-25 ENCOUNTER — Encounter: Payer: Self-pay | Admitting: Surgery

## 2015-04-25 ENCOUNTER — Ambulatory Visit (INDEPENDENT_AMBULATORY_CARE_PROVIDER_SITE_OTHER): Payer: Medicare Other | Admitting: Surgery

## 2015-04-25 ENCOUNTER — Ambulatory Visit
Admission: RE | Admit: 2015-04-25 | Discharge: 2015-04-25 | Disposition: A | Discharge status: Home / Self Care | Payer: Medicare Other | Source: Ambulatory Visit | Attending: Surgery | Admitting: Surgery

## 2015-04-25 VITALS — BP 158/88 | HR 60 | Resp 20 | Ht 71.0 in | Wt 165.0 lb

## 2015-04-25 DIAGNOSIS — R918 Other nonspecific abnormal finding of lung field: Secondary | ICD-10-CM

## 2015-04-25 DIAGNOSIS — D381 Neoplasm of uncertain behavior of trachea, bronchus and lung: Secondary | ICD-10-CM

## 2015-04-25 DIAGNOSIS — R911 Solitary pulmonary nodule: Secondary | ICD-10-CM | POA: Diagnosis not present

## 2015-04-28 ENCOUNTER — Encounter: Payer: Self-pay | Admitting: Surgery

## 2015-04-28 NOTE — Progress Notes (Signed)
HPI: Curtis Ramos returns for follow up of a right lower lobe nodule found on CT scan of the chest after admission in February for symptomatic hypercalcemia. His calcium on admission was 13.7. The medical team was concerned about whether his hypercalcemia could be due to this 1.3 cm lung nodule if it was a lung cancer. His calcium was reduced to 10.0 with hydration and was 10.9 at discharge. His intact PTH level was 39 which is normal to mildly elevated with a normal range of 15-65. His PTH-related protein was 16 which is normal ( 14-27). No further workup was performed in the hospital. A PET scan was done as an outpatient to further assess the lung nodule. This shows minimal hypermetabolic activity in the nodule that is within the benign range. No other areas of hypermetabolic activity were seen. 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 He had surgery on his right hand in October 2015 for an enlarging knot. This was removed and grew MAC. He was seen by Dr. Megan Salon and started on 3 drug antibiotic therapy. Within a few weeks he had myalgias and arthralgias and the drugs were stopped temporarily with resolution of his symptoms. The drugs were then restarted one at a time and he tolerated all 3. Then several weeks prior to admission he began having right sided headaches and pain in the right eye and was diagnosed with shingles. He was admitted on 2/11 with a one week history of worsening generalized weakness, poor po intake and was found to have hypercalcemia.  I last saw him on 01/15/2015 and I felt that we should do another chest CT in 3 months to follow up on this small lung lesion with very low level FDG uptake. He has not had any further work up for his episode of hypercalcemia but has not had any further symptoms and is back working full time in the [employer]. He denies any weight loss and has normal appetite.   Current Outpatient  Prescriptions  Medication Sig Dispense Refill  . cycloSPORINE (RESTASIS) 0.05 % ophthalmic emulsion Place 1 drop into the right eye 2 (two) times daily.    Marland Kitchen ethambutol (MYAMBUTOL) 400 MG tablet Take 3 tablets (1,200 mg total) by mouth daily. 90 tablet 11  . levothyroxine (SYNTHROID, LEVOTHROID) 175 MCG tablet Take 175 mcg by mouth daily before breakfast.    . loteprednol (LOTEMAX) 0.5 % ophthalmic suspension Place 1 drop into the right eye 2 (two) times daily.    . Multiple Vitamins-Minerals (ONE-A-DAY VITACRAVES IMMUNITY) CHEW Chew 2 tablets by mouth daily.    Marland Kitchen oxyCODONE-acetaminophen (PERCOCET/ROXICET) 5-325 MG per tablet Take 1 tablet by mouth every 4 (four) hours as needed for severe pain.    . pregabalin (LYRICA) 100 MG capsule Take 100 mg by mouth 3 (three) times daily.    . rifampin (RIFADIN) 300 MG capsule Take 2 capsules (600 mg total) by mouth daily. 60 capsule 11  . valACYclovir (VALTREX) 500 MG tablet Take 500 mg by mouth daily.     Marland Kitchen zinc gluconate 50 MG tablet Take 50 mg by mouth daily.     No current facility-administered medications for this visit.     Physical Exam: BP 158/88 mmHg  Pulse 60  Resp 20  Ht 5\' 11"  (1.803 m)  Wt 165 lb (74.844 kg)  BMI 23.02 kg/m2  SpO2 93% He looks well Lungs are clear There is no cervical, supraclavicular,  or axillary adenopathy.  Diagnostic Tests:   CLINICAL DATA: Followup right lower lobe lung nodule  EXAM: CT CHEST WITHOUT CONTRAST  TECHNIQUE: Multidetector CT imaging of the chest was performed following the standard protocol without IV contrast.  COMPARISON: 01/08/2015, 12/27/2014  FINDINGS: The lungs are well aerated bilaterally. Persistent nodular change in the right lower lobe is noted without significant interval growth. The solid component measures approximately 5 mm with an overall 15 x 12 mm ground-glass component surrounding the solid nodular density. This is mildly enlarged in size when compared with  the prior exam by approximately 2 mm. No new nodules are identified.  The thoracic inlet again demonstrates postsurgical changes. Calcification of the thoracic aorta is noted without significant aneurysmal dilatation. No hilar or mediastinal adenopathy is noted.  Scanning into the upper abdomen reveals no acute abnormality. No acute bony abnormality is noted. Degenerative changes of the thoracic spine are again noted.  IMPRESSION: Slight increase in the ground-glass component of the right lower lobe nodular density as described. Given the low level activity on recent PET-CT and slight increase in size this likely represents a slow growing adenocarcinoma. Surgical resection should be considered   Electronically Signed  By: Inez Catalina M.D.  On: 04/25/2015 15:47     Impression:  There has been very slight enlargement of the ground glass component of the small RLL nodule. This lesion had low level FDG uptake of 0.7 on PET scan in February. The solid component is only 5 mm and the overall size of the ground glass component is 15 x 12 mm. I think this lesion is too small to do a CT guided needle biopsy or ENB with any reliability. He is an 81 year old lifelong nonsmoker so I think we should be conservative. I discussed the options of continued follow up with CT and needle biopsy or ENB for biopsy. He would like to continue close follow up.   Plan:  I will see him in 3 months with a repeat chest CT.   Curtis Pollack, MD [facility] [phone]

## 2015-04-30 ENCOUNTER — Encounter: Payer: Self-pay | Admitting: Internal Medicine

## 2015-04-30 ENCOUNTER — Ambulatory Visit (INDEPENDENT_AMBULATORY_CARE_PROVIDER_SITE_OTHER): Payer: Medicare Other | Admitting: Internal Medicine

## 2015-04-30 VITALS — BP 153/81 | HR 64 | Temp 97.7°F | Wt 165.0 lb

## 2015-04-30 DIAGNOSIS — Z8619 Personal history of other infectious and parasitic diseases: Secondary | ICD-10-CM | POA: Diagnosis not present

## 2015-04-30 DIAGNOSIS — A319 Mycobacterial infection, unspecified: Secondary | ICD-10-CM

## 2015-04-30 DIAGNOSIS — B0233 Zoster keratitis: Secondary | ICD-10-CM | POA: Diagnosis not present

## 2015-04-30 MED ORDER — AZITHROMYCIN 250 MG PO TABS: 250.0000 mg | ORAL_TABLET | Freq: Every day | ORAL | Status: DC

## 2015-04-30 NOTE — Progress Notes (Signed)
Patient ID: [name], male   DOB: November 18, 1931, 81 y.o.   MRN: [id number]          Patient Active Problem List   Diagnosis Date Noted  . Lung mass   . Occult malignancy   . Hypercalcemia 12/26/2014  . AKI (acute kidney injury) 12/26/2014  . Decreased oral intake 12/26/2014  . Myalgia 11/04/2014  . Arthralgia 11/04/2014  . Hypothyroidism associated with surgical procedure 10/07/2014  . DJD (degenerative joint disease) 10/07/2014  . Atypical mycobacterium infection     Patient's Medications  New Prescriptions   No medications on file  Previous Medications   CYCLOSPORINE (RESTASIS) 0.05 % OPHTHALMIC EMULSION    Place 1 drop into the right eye 2 (two) times daily.   ETHAMBUTOL (MYAMBUTOL) 400 MG TABLET    Take 3 tablets (1,200 mg total) by mouth daily.   LEVOTHYROXINE (SYNTHROID, LEVOTHROID) 175 MCG TABLET    Take 175 mcg by mouth daily before breakfast.   LOTEPREDNOL (LOTEMAX) 0.5 % OPHTHALMIC SUSPENSION    Place 1 drop into the right eye 2 (two) times daily.   MULTIPLE VITAMINS-MINERALS (ONE-A-DAY VITACRAVES IMMUNITY) CHEW    Chew 2 tablets by mouth daily.   OXYCODONE-ACETAMINOPHEN (PERCOCET/ROXICET) 5-325 MG PER TABLET    Take 1 tablet by mouth every 4 (four) hours as needed for severe pain.   PREGABALIN (LYRICA) 100 MG CAPSULE    Take 100 mg by mouth 3 (three) times daily.   RIFAMPIN (RIFADIN) 300 MG CAPSULE    Take 2 capsules (600 mg total) by mouth daily.   VALACYCLOVIR (VALTREX) 500 MG TABLET    Take 500 mg by mouth daily.    ZINC GLUCONATE 50 MG TABLET    Take 50 mg by mouth daily.  Modified Medications   Modified Medication Previous Medication   AZITHROMYCIN (ZITHROMAX) 250 MG TABLET azithromycin (ZITHROMAX) 250 MG tablet      Take 1 tablet (250 mg total) by mouth daily.    Take 1 tablet (250 mg total) by mouth daily.  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 His wife is with him today. He is now completed just under 6 months of therapy with azithromycin, rifampin and ethambutol. He still has some limited range of motion of his right index finger but continues to work on physical therapy. He has had no problems tolerating his anabiotics. He states that his appetite is great and he has regained some weight that he had lost. Review of Systems: Pertinent items are noted in HPI.  Past Medical History  Diagnosis Date  . Hypothyroidism   . Wears glasses   . PONV (postoperative nausea and vomiting)   . Shingles   . Mycobacterium avium complex   . GERD (gastroesophageal reflux disease)   . Arthritis     "proabably"  . Cancer     family unaware of this hx on 12/26/2014    History  Substance Use Topics  . Smoking status: Never Smoker   . Smokeless tobacco: Never Used  . Alcohol Use: No    No family history on file.  No Known Allergies  Objective: Temp: 97.7 F (36.5 C) (06/15 1617) Temp Source: Oral (06/15 1617) BP: 153/81 mmHg (06/15 1617) Pulse Rate: 64 (06/15 1617) Body mass index is 23.02 kg/(m^2).  General: He is in good spirits as usual Joints and extremities:  His surgical incision is healed. Some scarring on his palm and limited range of motion of his right index finger but no evidence of active infection  Lab Results Lab Results  Component Value Date   WBC 5.9 12/29/2014   HGB 10.8* 12/29/2014   HCT 30.6* 12/29/2014   MCV 95.6 12/29/2014   PLT 168 12/29/2014    Lab Results  Component Value Date   CREATININE 1.19 12/31/2014   BUN 10 12/31/2014   NA 141 12/31/2014   K 2.9* 12/31/2014   CL 108 12/31/2014   CO2 20 12/31/2014    Lab Results  Component Value Date   ALT 67* 12/31/2014   AST 39* 12/31/2014   ALKPHOS 74 12/31/2014   BILITOT 0.8 12/31/2014     Assessment: I had a long discussion with Curtis Ramos and his wife again today about the uncertainty of the optimal  duration of therapy for this type of infection. I told him that anywhere from 6-12 months of therapy would be reasonable and that the decision of when to stop should be balanced on his own inherent assessment of the pros and cons associated with continued therapy. He is tolerating the treatments now but is aware that he could develop anabiotic associated problems with diarrhea or other complications. He feels comfortable continuing therapy for now.  Plan: 1. Continue 3 drug anabiotic therapy and follow-up in 3 months   Michel Bickers, MD [facility] [place] [phone] pager   [phone] cell 04/30/2015, 4:46 PM

## 2015-05-06 DIAGNOSIS — E890 Postprocedural hypothyroidism: Secondary | ICD-10-CM | POA: Diagnosis not present

## 2015-05-06 DIAGNOSIS — Z85850 Personal history of malignant neoplasm of thyroid: Secondary | ICD-10-CM | POA: Diagnosis not present

## 2015-05-15 DIAGNOSIS — H5202 Hypermetropia, left eye: Secondary | ICD-10-CM | POA: Diagnosis not present

## 2015-05-15 DIAGNOSIS — H40023 Open angle with borderline findings, high risk, bilateral: Secondary | ICD-10-CM | POA: Diagnosis not present

## 2015-05-28 DIAGNOSIS — M25641 Stiffness of right hand, not elsewhere classified: Secondary | ICD-10-CM | POA: Diagnosis not present

## 2015-05-28 DIAGNOSIS — S52531D Colles' fracture of right radius, subsequent encounter for closed fracture with routine healing: Secondary | ICD-10-CM | POA: Diagnosis not present

## 2015-06-03 DIAGNOSIS — B351 Tinea unguium: Secondary | ICD-10-CM | POA: Diagnosis not present

## 2015-07-07 DIAGNOSIS — I8310 Varicose veins of unspecified lower extremity with inflammation: Secondary | ICD-10-CM | POA: Diagnosis not present

## 2015-07-07 DIAGNOSIS — B351 Tinea unguium: Secondary | ICD-10-CM | POA: Diagnosis not present

## 2015-07-08 DIAGNOSIS — Z85850 Personal history of malignant neoplasm of thyroid: Secondary | ICD-10-CM | POA: Diagnosis not present

## 2015-07-08 DIAGNOSIS — E890 Postprocedural hypothyroidism: Secondary | ICD-10-CM | POA: Diagnosis not present

## 2015-07-14 ENCOUNTER — Other Ambulatory Visit: Payer: Self-pay | Admitting: Surgery

## 2015-07-14 DIAGNOSIS — R918 Other nonspecific abnormal finding of lung field: Secondary | ICD-10-CM

## 2015-07-24 DIAGNOSIS — D485 Neoplasm of uncertain behavior of skin: Secondary | ICD-10-CM | POA: Diagnosis not present

## 2015-07-24 DIAGNOSIS — Z6824 Body mass index (BMI) 24.0-24.9, adult: Secondary | ICD-10-CM | POA: Diagnosis not present

## 2015-07-24 DIAGNOSIS — L84 Corns and callosities: Secondary | ICD-10-CM | POA: Diagnosis not present

## 2015-07-24 DIAGNOSIS — M79674 Pain in right toe(s): Secondary | ICD-10-CM | POA: Diagnosis not present

## 2015-07-29 DIAGNOSIS — C44622 Squamous cell carcinoma of skin of right upper limb, including shoulder: Secondary | ICD-10-CM | POA: Diagnosis not present

## 2015-07-29 DIAGNOSIS — Z6824 Body mass index (BMI) 24.0-24.9, adult: Secondary | ICD-10-CM | POA: Diagnosis not present

## 2015-07-30 DIAGNOSIS — D485 Neoplasm of uncertain behavior of skin: Secondary | ICD-10-CM | POA: Diagnosis not present

## 2015-07-30 DIAGNOSIS — C44622 Squamous cell carcinoma of skin of right upper limb, including shoulder: Secondary | ICD-10-CM | POA: Diagnosis not present

## 2015-08-06 ENCOUNTER — Ambulatory Visit
Admission: RE | Admit: 2015-08-06 | Discharge: 2015-08-06 | Disposition: A | Discharge status: Home / Self Care | Payer: Medicare Other | Source: Ambulatory Visit | Attending: Surgery | Admitting: Surgery

## 2015-08-06 ENCOUNTER — Encounter: Payer: Self-pay | Admitting: Surgery

## 2015-08-06 ENCOUNTER — Ambulatory Visit (INDEPENDENT_AMBULATORY_CARE_PROVIDER_SITE_OTHER): Payer: Medicare Other | Admitting: Surgery

## 2015-08-06 VITALS — BP 145/83 | HR 62 | Resp 20 | Ht 71.0 in | Wt 165.0 lb

## 2015-08-06 DIAGNOSIS — R918 Other nonspecific abnormal finding of lung field: Secondary | ICD-10-CM

## 2015-08-06 DIAGNOSIS — D381 Neoplasm of uncertain behavior of trachea, bronchus and lung: Secondary | ICD-10-CM | POA: Diagnosis not present

## 2015-08-06 DIAGNOSIS — R911 Solitary pulmonary nodule: Secondary | ICD-10-CM | POA: Diagnosis not present

## 2015-08-08 ENCOUNTER — Encounter: Payer: Self-pay | Admitting: Surgery

## 2015-08-08 NOTE — Progress Notes (Signed)
HPI:  Mr. Sine returns today to follow up on a small right lower lobe lung nodule found on CT scan of the chest after admission in February for symptomatic hypercalcemia. PET scan was done as an outpatient to further assess the lung nodule. This shows minimal hypermetabolic activity of 0.6 SUV in the nodule that is within the benign range. No other areas of hypermetabolic activity were seen. I last saw him on 04/28/2015 and there had been very slight enlargement of the ground glass component of the small RLL nodule. CT of the chest now shows the nodule to be unchanged in size and appearance measuring 1.3 x 1.1 cm. He has been doing well overall. He has been eating well and maintaining his weight.  Current Outpatient Prescriptions  Medication Sig Dispense Refill  . cycloSPORINE (RESTASIS) 0.05 % ophthalmic emulsion Place 1 drop into the right eye 2 (two) times daily.    Marland Kitchen ethambutol (MYAMBUTOL) 400 MG tablet Take 3 tablets (1,200 mg total) by mouth daily. 90 tablet 11  . levothyroxine (SYNTHROID, LEVOTHROID) 175 MCG tablet Take 175 mcg by mouth daily before breakfast.    . loteprednol (LOTEMAX) 0.5 % ophthalmic suspension Place 1 drop into the right eye 2 (two) times daily.    . Multiple Vitamins-Minerals (ONE-A-DAY VITACRAVES IMMUNITY) CHEW Chew 2 tablets by mouth daily.    Marland Kitchen oxyCODONE-acetaminophen (PERCOCET/ROXICET) 5-325 MG per tablet Take 1 tablet by mouth every 4 (four) hours as needed for severe pain.    . pregabalin (LYRICA) 100 MG capsule Take 100 mg by mouth 2 (two) times daily.     . rifampin (RIFADIN) 300 MG capsule Take 2 capsules (600 mg total) by mouth daily. 60 capsule 11  . valACYclovir (VALTREX) 500 MG tablet Take 500 mg by mouth daily.     Marland Kitchen zinc gluconate 50 MG tablet Take 50 mg by mouth daily.     No current facility-administered medications for this visit.     Physical Exam: BP 145/83 mmHg  Pulse 62  Resp 20  Ht 5\' 11"  (1.803 m)  Wt 165 lb (74.844 kg)  BMI  23.02 kg/m2  SpO2 97% He looks well Lungs are clear There is no cervical, supraclavicular, or axillary adenopathy.  Diagnostic Tests:  CLINICAL DATA: Follow-up lung mass/ nodule within the right lower lobe.  EXAM: CT CHEST WITHOUT CONTRAST  TECHNIQUE: Multidetector CT imaging of the chest was performed following the standard protocol without IV contrast.  COMPARISON: Chest CT dated 04/25/2015. Also chest CT dated 12/27/2014.  FINDINGS: The semi solid pulmonary nodule within the right lower lobe is stable in size compared to the previous exam, measuring 1.3 x 1.1 cm. As described on the most recent prior study, the ground-glass component is slightly more prominent than on the earliest study of 12/27/2014. The appearance is unchanged compared to the more recent prior study of 04/25/2015. Per report, this showed low level activity on interval PET-CT suggesting slow growing adenocarcinoma.  Again noted is biapical scarring/ fibrosis, unchanged. Mild scarring also again noted at the left lung base. Remainder of the lungs remain clear. No pleural effusion. Trachea and central bronchi appear normal.  Scattered atherosclerotic changes are seen along the walls of the thoracic aorta, stable in caliber and configuration. Heart size is upper normal, unchanged. No pericardial effusion. No mass or enlarged lymph nodes seen within the mediastinum or perihilar regions. Limited images of the upper abdomen are unremarkable. Degenerative changes are seen throughout the thoracic spine but no  acute osseous abnormality. There is evidence of an old healed sternal fracture.  IMPRESSION: 1. The semi-solid pulmonary nodule, partially ground-glass density, is unchanged in size or appearance compared to the most recent prior study of 04/25/2015, measuring 1.3 x 1.1 cm today. Overall size is similar to the earlier chest CT of 12/27/2014 although, as described on the previous report, the  ground-glass component was increasingly prominent. Per previous report, low level activity on PET-CT suggested this to be a slow growing adenocarcinoma. As stated on the previous CT report, surgical resection should be considered. 2. Overall stable chest CT. No acute findings. No new lung findings.   Electronically Signed  By: Franki Cabot M.D.  On: 08/06/2015 15:25   Impression:  The simi-solid ground-glass nodule in the RLL is unchanged in size and appearance from June 2016. It is unchanged in size compared to the previous CT of 12/27/2014 but the ground glass component looks more prominent. This had minimal activity of 0.6 on PET scan and could certainly be a benign lesion. Since he is 81 years old I think I would be conservative and continue to follow this for a while. I think it is small enough that a needle biopsy would have a significant false negative rate. I reviewed the scans with him and his wife and son and discussed my recommendation. All of their questions have been answered.  Plan:  I will see him back in 3 months with a low dose CT of the chest.   Gaye Pollack, MD [facility] [phone]

## 2015-08-21 ENCOUNTER — Encounter: Payer: Self-pay | Admitting: Internal Medicine

## 2015-08-21 ENCOUNTER — Ambulatory Visit (INDEPENDENT_AMBULATORY_CARE_PROVIDER_SITE_OTHER): Payer: Medicare Other | Admitting: Internal Medicine

## 2015-08-21 VITALS — BP 125/71 | HR 76 | Temp 97.1°F | Wt 176.0 lb

## 2015-08-21 DIAGNOSIS — A319 Mycobacterial infection, unspecified: Secondary | ICD-10-CM | POA: Diagnosis not present

## 2015-08-21 DIAGNOSIS — Z23 Encounter for immunization: Secondary | ICD-10-CM

## 2015-08-21 MED ORDER — RIFAMPIN 300 MG PO CAPS: 600.0000 mg | ORAL_CAPSULE | Freq: Every day | ORAL | Status: AC

## 2015-08-21 MED ORDER — ETHAMBUTOL HCL 400 MG PO TABS: 1200.0000 mg | ORAL_TABLET | Freq: Every day | ORAL | Status: AC

## 2015-08-21 NOTE — Progress Notes (Signed)
Patient ID: Curtis [facility], male   DOB: 03-22-1932, 81 y.o.   MRN: [id number]         [facility]  Patient Active Problem List   Diagnosis Date Noted  . Lung mass   . Occult malignancy (C[facility] Ramos)   . Hypercalcemia 12/26/2014  . AKI (acute kidney injury) (Curtis Ramos) 12/26/2014  . Decreased oral intake 12/26/2014  . Myalgia 11/04/2014  . Arthralgia 11/04/2014  . Hypothyroidism associated with surgical procedure 10/07/2014  . DJD (degenerative joint disease) 10/07/2014  . Atypical mycobacterium infection     Patient's Medications  New Prescriptions   No medications on file  Previous Medications   AZITHROMYCIN (ZITHROMAX) 500 MG TABLET    Take by mouth daily.   CYCLOSPORINE (RESTASIS) 0.05 % OPHTHALMIC EMULSION    Place 1 drop into the right eye 2 (two) times daily.   LEVOTHYROXINE (SYNTHROID, LEVOTHROID) 175 MCG TABLET    Take 175 mcg by mouth daily before breakfast.   LOTEPREDNOL (LOTEMAX) 0.5 % OPHTHALMIC SUSPENSION    Place 1 drop into the right eye 2 (two) times daily.   MULTIPLE VITAMINS-MINERALS (ONE-A-DAY VITACRAVES IMMUNITY) CHEW    Chew 2 tablets by mouth daily.   OXYCODONE-ACETAMINOPHEN (PERCOCET/ROXICET) 5-325 MG PER TABLET    Take 1 tablet by mouth every 4 (four) hours as needed for severe pain.   PREGABALIN (LYRICA) 100 MG CAPSULE    Take 100 mg by mouth 2 (two) times daily.    VALACYCLOVIR (VALTREX) 500 MG TABLET    Take 500 mg by mouth daily.    ZINC GLUCONATE 50 MG TABLET    Take 50 mg by mouth daily.  Modified Medications   Modified Medication Previous Medication   ETHAMBUTOL (MYAMBUTOL) 400 MG TABLET ethambutol (MYAMBUTOL) 400 MG tablet      Take 3 tablets (1,200 mg total) by mouth daily.    Take 3 tablets (1,200 mg total) by mouth daily.   RIFAMPIN (RIFADIN) 300 MG CAPSULE rifampin (RIFADIN) 300 MG capsule      Take 2 capsules (600 mg total) by mouth daily.    Take 2 capsules (600 mg total) by mouth daily.  Discontinued Medications   No  medications on file    Subjective: Curtis Ramos is in for his routine follow-up visit. He continues to take azithromycin, ethambutol and rifampin for his Mycobacterium avium tenosynovitis of his right hand. He has not had any recent problems tolerating his antibiotics. He is now completed a little over 11 months of therapy. He recently had a skin cancer removed from the dorsum of his right hand. He states that he still has a little bit of difficulty with full flexion of his right index finger but overall his range of motion has improved. 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 He has not had any fever, chills, sweats, nausea, vomiting or diarrhea.  Review of Systems: Pertinent items are noted in HPI.  Past Medical History  Diagnosis Date  . Hypothyroidism   . Wears glasses   . PONV (postoperative nausea and vomiting)   . Shingles   . Mycobacterium avium complex (Curtis Ramos)   . GERD (gastroesophageal reflux disease)   . Arthritis     "proabably"  . Cancer [facility] Ramos)     family unaware of this hx on 12/26/2014    Social History  Substance Use Topics  . Smoking status: Never Smoker   . Smokeless tobacco: Never Used  . Alcohol  Use: No    No family history on file.  No Known Allergies  Objective: Filed Vitals:   08/21/15 1626  BP: 125/71  Pulse: 76  Temp: 97.1 F (36.2 C)  TempSrc: Oral  Weight: 176 lb (79.833 kg)   Body mass index is 24.56 kg/(m^2).  General: He is joking and in good spirits. His wife is with him today Right hand: Improved but still incomplete flexion of his right hand. There is no swelling or inflammation to suggest active infection    Problem List Items Addressed This Visit      Unprioritized   Atypical mycobacterium infection - Primary    He continues to improve with increased range of motion of his right index finger. I'm quite hopeful that his Mycobacterium avium infection has been cured through a combination of surgery last  year and prolonged antibiotic therapy. He will complete the last few weeks of his current supply of antibiotics then stop. He will follow-up with me in 3 months.      Relevant Medications   azithromycin (ZITHROMAX) 500 MG tablet       Michel Bickers, MD [facility] [phone] pager   [phone] cell 08/21/2015, 4:41 PM

## 2015-08-21 NOTE — Assessment & Plan Note (Signed)
He continues to improve with increased range of motion of his right index finger. I'm quite hopeful that his Mycobacterium avium infection has been cured through a combination of surgery last year and prolonged antibiotic therapy. He will complete the last few weeks of his current supply of antibiotics then stop. He will follow-up with me in 3 months.

## 2015-09-08 DIAGNOSIS — E890 Postprocedural hypothyroidism: Secondary | ICD-10-CM | POA: Diagnosis not present

## 2015-09-08 DIAGNOSIS — Z85850 Personal history of malignant neoplasm of thyroid: Secondary | ICD-10-CM | POA: Diagnosis not present

## 2015-09-11 DIAGNOSIS — D125 Benign neoplasm of sigmoid colon: Secondary | ICD-10-CM | POA: Diagnosis not present

## 2015-09-11 DIAGNOSIS — D126 Benign neoplasm of colon, unspecified: Secondary | ICD-10-CM | POA: Diagnosis not present

## 2015-09-11 DIAGNOSIS — K640 First degree hemorrhoids: Secondary | ICD-10-CM | POA: Diagnosis not present

## 2015-09-11 DIAGNOSIS — K5730 Diverticulosis of large intestine without perforation or abscess without bleeding: Secondary | ICD-10-CM | POA: Diagnosis not present

## 2015-09-11 DIAGNOSIS — Z1211 Encounter for screening for malignant neoplasm of colon: Secondary | ICD-10-CM | POA: Diagnosis not present

## 2015-09-17 DIAGNOSIS — S63490D Traumatic rupture of other ligament of right index finger at metacarpophalangeal and interphalangeal joint, subsequent encounter: Secondary | ICD-10-CM | POA: Diagnosis not present

## 2015-09-17 DIAGNOSIS — S63242A Subluxation of distal interphalangeal joint of right middle finger, initial encounter: Secondary | ICD-10-CM | POA: Diagnosis not present

## 2015-10-16 ENCOUNTER — Other Ambulatory Visit: Payer: Self-pay | Admitting: Surgery

## 2015-10-16 DIAGNOSIS — R918 Other nonspecific abnormal finding of lung field: Secondary | ICD-10-CM

## 2015-11-05 ENCOUNTER — Encounter: Payer: Self-pay | Admitting: Surgery

## 2015-11-05 ENCOUNTER — Ambulatory Visit (INDEPENDENT_AMBULATORY_CARE_PROVIDER_SITE_OTHER): Payer: Medicare Other | Admitting: Surgery

## 2015-11-05 ENCOUNTER — Ambulatory Visit
Admission: RE | Admit: 2015-11-05 | Discharge: 2015-11-05 | Disposition: A | Discharge status: Home / Self Care | Payer: Medicare Other | Source: Ambulatory Visit | Attending: Surgery | Admitting: Surgery

## 2015-11-05 ENCOUNTER — Encounter: Payer: Self-pay | Admitting: Sports Medicine

## 2015-11-05 ENCOUNTER — Ambulatory Visit (INDEPENDENT_AMBULATORY_CARE_PROVIDER_SITE_OTHER): Payer: Medicare Other | Admitting: Sports Medicine

## 2015-11-05 VITALS — BP 150/85 | HR 77 | Resp 20 | Ht 71.0 in | Wt 176.0 lb

## 2015-11-05 DIAGNOSIS — M2040 Other hammer toe(s) (acquired), unspecified foot: Secondary | ICD-10-CM | POA: Diagnosis not present

## 2015-11-05 DIAGNOSIS — M21619 Bunion of unspecified foot: Secondary | ICD-10-CM | POA: Diagnosis not present

## 2015-11-05 DIAGNOSIS — L909 Atrophic disorder of skin, unspecified: Secondary | ICD-10-CM

## 2015-11-05 DIAGNOSIS — L84 Corns and callosities: Secondary | ICD-10-CM | POA: Diagnosis not present

## 2015-11-05 DIAGNOSIS — D381 Neoplasm of uncertain behavior of trachea, bronchus and lung: Secondary | ICD-10-CM | POA: Diagnosis not present

## 2015-11-05 DIAGNOSIS — M79671 Pain in right foot: Secondary | ICD-10-CM

## 2015-11-05 DIAGNOSIS — M79672 Pain in left foot: Secondary | ICD-10-CM

## 2015-11-05 DIAGNOSIS — R29898 Other symptoms and signs involving the musculoskeletal system: Secondary | ICD-10-CM | POA: Diagnosis not present

## 2015-11-05 DIAGNOSIS — R918 Other nonspecific abnormal finding of lung field: Secondary | ICD-10-CM | POA: Diagnosis not present

## 2015-11-05 NOTE — Progress Notes (Signed)
Patient ID: Curtis Ramos, male   DOB: [birth year], 81 y.o.   MRN: [id number] Subjective: Curtis Ramos is a 81 y.o. male patient who presents to office for evaluation of Right> Left foot pain at third toe. 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 Admits that he has tried comfortable shoes that offered some relief. However desires to know "why" his feet become so painful and tender at times Patient denies any other pedal complaints. Denies injury/trip/fall/sprain/any causative factors.   Patient Active Problem List   Diagnosis Date Noted  . Lung mass   . Occult malignancy ([place])   . Hypercalcemia 12/26/2014  . AKI (acute kidney injury) ([place]) 12/26/2014  . Decreased oral intake 12/26/2014  . Myalgia 11/04/2014  . Arthralgia 11/04/2014  . Hypothyroidism associated with surgical procedure 10/07/2014  . DJD (degenerative joint disease) 10/07/2014  . Atypical mycobacterium infection    Current Outpatient Prescriptions on File Prior to Visit  Medication Sig Dispense Refill  . cycloSPORINE (RESTASIS) 0.05 % ophthalmic emulsion Place 1 drop into the right eye 2 (two) times daily.    Marland Kitchen levothyroxine (SYNTHROID, LEVOTHROID) 175 MCG tablet Take 175 mcg by mouth daily before breakfast.    . loteprednol (LOTEMAX) 0.5 % ophthalmic suspension Place 1 drop into the right eye 2 (two) times daily.    . Multiple Vitamins-Minerals (ONE-A-DAY VITACRAVES IMMUNITY) CHEW Chew 2 tablets by mouth daily.    Marland Kitchen oxyCODONE-acetaminophen (PERCOCET/ROXICET) 5-325 MG per tablet Take 1 tablet by mouth every 4 (four) hours as needed for severe pain.    . pregabalin (LYRICA) 100 MG capsule Take 100 mg by mouth 2 (two) times daily.     . valACYclovir (VALTREX) 500 MG tablet Take 500 mg by mouth daily.     Marland Kitchen zinc gluconate 50 MG tablet Take 50 mg by mouth daily.     No current facility-administered medications on file prior to  visit.   No Known Allergies   Objective:  General: Alert and oriented x3 in no acute distress  Dermatology: There is callus/keratotic tissue noted to the distal tip of the right third toe and plantar aspect of the left first metatarsal with no underlying opening bilateral lower extremities, no webspace macerations, no ecchymosis bilateral,  all nails x are mildly elongated and thickened. No signs of infection  Vascular: Dorsalis Pedis and Posterior Tibial pedal pulses 1/4, Capillary Fill Time 3 seconds, decreased pedal hair growth bilateral, trace edema bilateral lower extremities with varicosities, Temperature gradient within normal limits.  Neurology: Gross sensation intact via light touch bilateral, Protective sensation intact  with Semmes Weinstein Monofilament to all pedal sites, Position sense intact, vibratory intact bilateral, Deep tendon reflexes within normal limits bilateral, No babinski sign present bilateral. (- )Tinels sign.   Musculoskeletal: Mild tenderness with palpation at third toe callus on Right, asymptomatic bunion and hammertoe with plantar fat pad atrophy and prominent metatarsal heads 1 through 5 bilateral No pain with calf compression bilateral. Ankle and pedal joint range of motion is within normal limits, Strength within normal limits in all groups bilateral.   Assessment and Plan: Problem List Items Addressed This Visit    None    Visit Diagnoses    Foot pain, bilateral    -  Primary    Hammer toe, unspecified laterality        Bunion        Callus of foot  Fat pad atrophy of foot           -Complete examination performed -Discussed treatement options for foot pain which is secondary to structural deformity and callus; informed patient that no pain in feet may be unrealistic without more aggressive correction of underlying deformities; Goal is to make patient more comfortable with good supportive shoes and inserts; patient is not a candidate at this  time for or any surgical intervention  -Mechanically debrided using a sterile chisel blade Callus 2 without incident -Recommend good supportive shoes and continuing with Plastizote inlay accommodative inserts -Recommend padding and protection gave to patient today silicone toe for right 3rd toe As a courtesy to debrided patient's nails without incident -Recommend skin emollients daily to assist with softening callus areas -Patient to return to office 6 weeks for follow up callus care or sooner if condition worsens.  Landis Martins, DPM

## 2015-11-05 NOTE — Progress Notes (Signed)
HPI:  Mr. Capel returns today to follow up on a small right lower lobe lung nodule found on CT scan of the chest after admission in February for symptomatic hypercalcemia. PET scan was done as an outpatient to further assess the lung nodule. This shows minimal hypermetabolic activity of 0.6 SUV in the nodule that is within the benign range. No other areas of hypermetabolic activity were seen. I last saw him on 04/28/2015 and there had been very slight enlargement of the ground glass component of the small RLL nodule. CT of the chest on 08/06/2015 showed the nodule to be unchanged in size and appearance measuring 1.3 x 1.1 cm. He has been doing well overall.  Current Outpatient Prescriptions  Medication Sig Dispense Refill  . cycloSPORINE (RESTASIS) 0.05 % ophthalmic emulsion Place 1 drop into the right eye 2 (two) times daily.    Marland Kitchen levothyroxine (SYNTHROID, LEVOTHROID) 175 MCG tablet Take 175 mcg by mouth daily before breakfast.    . loteprednol (LOTEMAX) 0.5 % ophthalmic suspension Place 1 drop into the right eye 2 (two) times daily.    . Multiple Vitamins-Minerals (ONE-A-DAY VITACRAVES IMMUNITY) CHEW Chew 2 tablets by mouth daily.    Marland Kitchen oxyCODONE-acetaminophen (PERCOCET/ROXICET) 5-325 MG per tablet Take 1 tablet by mouth every 4 (four) hours as needed for severe pain.    . pregabalin (LYRICA) 100 MG capsule Take 100 mg by mouth 2 (two) times daily.     . valACYclovir (VALTREX) 500 MG tablet Take 500 mg by mouth daily.     Marland Kitchen zinc gluconate 50 MG tablet Take 50 mg by mouth daily.     No current facility-administered medications for this visit.     Physical Exam: BP 150/85 mmHg  Pulse 77  Resp 20  Ht 5\' 11"  (1.803 m)  Wt 176 lb (79.833 kg)  BMI 24.56 kg/m2  SpO2 96% He looks well Lungs are clear There is no cervical, supraclavicular, or axillary adenopathy.   Diagnostic Tests:  CLINICAL DATA: Followup known lung mass  EXAM: CT CHEST WITHOUT  CONTRAST  TECHNIQUE: Multidetector CT imaging of the chest was performed following the standard protocol without IV contrast.  COMPARISON: 08/06/2015  FINDINGS: The lungs are well aerated bilaterally. There is again noted a right lower lobe semi solid nodule which measures approximately 13 x 11 mm. It is stable in appearance from the prior exam. No new significant increase in its solid component is noted. No new nodules are identified. Mild emphysematous changes are again noted.  Aortic calcifications are seen without aneurysmal dilatation. No hilar or mediastinal adenopathy is noted. The visualized upper abdomen shows no acute abnormality. No acute osseous abnormality is noted.  IMPRESSION: Stable semi solid nodule in the right lower lobe. Continued followup is recommended as this may represent a slow growing adenocarcinoma.  The remainder of the exam is stable from the prior study.   Electronically Signed  By: Inez Catalina M.D.  On: 11/05/2015 10:56  Impression:  The RLL lesion is unchanged but could be a slow growing adenocarcinoma. I think it is too small to get a reliable CT guided needle biopsy. Given his age of 79 I would continue conservative therapy and follow this small lesion to see if it progresses. I reviewed the CT scan with the patient and his family and compared it to his previous scans. I answered all of their questions and they agree with the plan for follow up.  Plan:  I will see him back in  4 months with a CT scan of the chest.   Gaye Pollack, MD [facility] [phone]

## 2015-11-05 NOTE — Progress Notes (Deleted)
   Subjective:    Patient ID: Curtis Ramos, male    DOB: 10/11/1932, 81 y.o.   MRN: [id number]  HPI    Review of Systems  All other systems reviewed and are negative.      Objective:   Physical Exam        Assessment & Plan:

## 2015-11-20 DIAGNOSIS — B0230 Zoster ocular disease, unspecified: Secondary | ICD-10-CM | POA: Diagnosis not present

## 2015-11-20 DIAGNOSIS — H40023 Open angle with borderline findings, high risk, bilateral: Secondary | ICD-10-CM | POA: Diagnosis not present

## 2015-12-04 ENCOUNTER — Encounter: Payer: Self-pay | Admitting: Internal Medicine

## 2015-12-04 ENCOUNTER — Ambulatory Visit (INDEPENDENT_AMBULATORY_CARE_PROVIDER_SITE_OTHER): Payer: Medicare Other | Admitting: Internal Medicine

## 2015-12-04 VITALS — BP 128/83 | HR 102 | Temp 98.3°F | Wt 168.0 lb

## 2015-12-04 DIAGNOSIS — A319 Mycobacterial infection, unspecified: Secondary | ICD-10-CM | POA: Diagnosis not present

## 2015-12-04 NOTE — Progress Notes (Signed)
[facility] for Infectious Disease  Patient Active Problem List   Diagnosis Date Noted  . Lung mass   . Occult malignancy ([place])   . Hypercalcemia 12/26/2014  . AKI (acute kidney injury) ([place]) 12/26/2014  . Decreased oral intake 12/26/2014  . Myalgia 11/04/2014  . Arthralgia 11/04/2014  . Hypothyroidism associated with surgical procedure 10/07/2014  . DJD (degenerative joint disease) 10/07/2014  . Atypical mycobacterium infection     Patient's Medications  New Prescriptions   No medications on file  Previous Medications   LEVOTHYROXINE (SYNTHROID, LEVOTHROID) 175 MCG TABLET    Take 175 mcg by mouth daily before breakfast.   MULTIPLE VITAMINS-MINERALS (ONE-A-DAY VITACRAVES IMMUNITY) CHEW    Chew 2 tablets by mouth daily.   OXYCODONE-ACETAMINOPHEN (PERCOCET/ROXICET) 5-325 MG PER TABLET    Take 1 tablet by mouth every 4 (four) hours as needed for severe pain.   PREGABALIN (LYRICA) 100 MG CAPSULE    Take 100 mg by mouth 2 (two) times daily.    VALACYCLOVIR (VALTREX) 500 MG TABLET    Take 500 mg by mouth daily.    ZINC GLUCONATE 50 MG TABLET    Take 50 mg by mouth daily.  Modified Medications   No medications on file  Discontinued Medications   CYCLOSPORINE (RESTASIS) 0.05 % OPHTHALMIC EMULSION    Place 1 drop into the right eye 2 (two) times daily.   LOTEPREDNOL (LOTEMAX) 0.5 % OPHTHALMIC SUSPENSION    Place 1 drop into the right eye 2 (two) times daily.    Subjective: [name] is in with his wife for his routine follow-up visit. He completed 1 year of therapy for Mycobacterium avium tenosynovitis of his right hand about 2 and half months ago. He continues to improve. He has more range of motion and is now playing the piano again and working in his woodworking shop daily.  Review of Systems: Review of Systems  Constitutional: Negative for fever, chills and diaphoresis.  Musculoskeletal: Negative for joint pain.    Past Medical History  Diagnosis Date  .  Hypothyroidism   . Wears glasses   . PONV (postoperative nausea and vomiting)   . Shingles   . Mycobacterium avium complex ([place])   . GERD (gastroesophageal reflux disease)   . Arthritis     "proabably"  . Cancer [facility])     family unaware of this hx on 12/26/2014    Social History  Substance Use Topics  . Smoking status: Never Smoker   . Smokeless tobacco: Never Used  . Alcohol Use: No    No family history on file.  No Known Allergies  Objective: Filed Vitals:   12/04/15 1613  BP: 128/83  Pulse: 102  Temp: 98.3 F (36.8 C)  TempSrc: Oral  Weight: 168 lb (76.204 kg)   Body mass index is 23.44 kg/(m^2).  Physical Exam  Constitutional: He is oriented to person, place, and time.  He is talkative and in very good spirits today.  Musculoskeletal:  He has no swelling, warmth, redness or pain in his right hand and fingers. Range of motion of his fingers has improved significantly.  Neurological: He is alert and oriented to person, place, and time.  Skin: No rash noted.  Psychiatric: Mood and affect normal.    Lab Results    Problem List Items Addressed This Visit      Unprioritized   Atypical mycobacterium infection - Primary    I believe that his infection has  been cured. He will continue off of antibiotics and follow-up here as needed.          Michel Bickers, MD [facility] [phone] pager   [phone] cell 12/04/2015, 4:53 PM

## 2015-12-04 NOTE — Assessment & Plan Note (Signed)
I believe that his infection has been cured. He will continue off of antibiotics and follow-up here as needed.

## 2015-12-09 DIAGNOSIS — E890 Postprocedural hypothyroidism: Secondary | ICD-10-CM | POA: Diagnosis not present

## 2015-12-09 DIAGNOSIS — Z85850 Personal history of malignant neoplasm of thyroid: Secondary | ICD-10-CM | POA: Diagnosis not present

## 2015-12-19 DIAGNOSIS — L84 Corns and callosities: Secondary | ICD-10-CM | POA: Diagnosis not present

## 2015-12-19 DIAGNOSIS — K644 Residual hemorrhoidal skin tags: Secondary | ICD-10-CM | POA: Diagnosis not present

## 2015-12-19 DIAGNOSIS — M79674 Pain in right toe(s): Secondary | ICD-10-CM | POA: Diagnosis not present

## 2016-01-19 DIAGNOSIS — C44629 Squamous cell carcinoma of skin of left upper limb, including shoulder: Secondary | ICD-10-CM | POA: Diagnosis not present

## 2016-01-19 DIAGNOSIS — L821 Other seborrheic keratosis: Secondary | ICD-10-CM | POA: Diagnosis not present

## 2016-01-19 DIAGNOSIS — D485 Neoplasm of uncertain behavior of skin: Secondary | ICD-10-CM | POA: Diagnosis not present

## 2016-01-28 DIAGNOSIS — H01012 Ulcerative blepharitis right lower eyelid: Secondary | ICD-10-CM | POA: Diagnosis not present

## 2016-01-28 DIAGNOSIS — H1131 Conjunctival hemorrhage, right eye: Secondary | ICD-10-CM | POA: Diagnosis not present

## 2016-01-29 DIAGNOSIS — C44629 Squamous cell carcinoma of skin of left upper limb, including shoulder: Secondary | ICD-10-CM | POA: Diagnosis not present

## 2016-02-05 ENCOUNTER — Other Ambulatory Visit: Payer: Self-pay | Admitting: *Deleted

## 2016-02-05 DIAGNOSIS — R911 Solitary pulmonary nodule: Secondary | ICD-10-CM

## 2016-03-03 ENCOUNTER — Ambulatory Visit
Admission: RE | Admit: 2016-03-03 | Discharge: 2016-03-03 | Disposition: A | Discharge status: Home / Self Care | Payer: Medicare Other | Source: Ambulatory Visit | Attending: Surgery | Admitting: Surgery

## 2016-03-03 ENCOUNTER — Encounter: Payer: Medicare Other | Admitting: Surgery

## 2016-03-03 DIAGNOSIS — R911 Solitary pulmonary nodule: Secondary | ICD-10-CM | POA: Diagnosis not present

## 2016-03-09 DIAGNOSIS — E890 Postprocedural hypothyroidism: Secondary | ICD-10-CM | POA: Diagnosis not present

## 2016-03-09 DIAGNOSIS — Z85850 Personal history of malignant neoplasm of thyroid: Secondary | ICD-10-CM | POA: Diagnosis not present

## 2016-03-23 DIAGNOSIS — Z9181 History of falling: Secondary | ICD-10-CM | POA: Diagnosis not present

## 2016-03-23 DIAGNOSIS — L84 Corns and callosities: Secondary | ICD-10-CM | POA: Diagnosis not present

## 2016-03-23 DIAGNOSIS — Z1389 Encounter for screening for other disorder: Secondary | ICD-10-CM | POA: Diagnosis not present

## 2016-03-23 DIAGNOSIS — Z6824 Body mass index (BMI) 24.0-24.9, adult: Secondary | ICD-10-CM | POA: Diagnosis not present

## 2016-03-24 ENCOUNTER — Encounter: Payer: Self-pay | Admitting: Surgery

## 2016-03-24 ENCOUNTER — Ambulatory Visit: Payer: Medicare Other | Admitting: Surgery

## 2016-03-24 NOTE — Progress Notes (Unsigned)
This encounter was created in error - please disregard.

## 2016-06-08 DIAGNOSIS — E890 Postprocedural hypothyroidism: Secondary | ICD-10-CM | POA: Diagnosis not present

## 2016-06-08 DIAGNOSIS — Z85850 Personal history of malignant neoplasm of thyroid: Secondary | ICD-10-CM | POA: Diagnosis not present

## 2016-06-24 DIAGNOSIS — D485 Neoplasm of uncertain behavior of skin: Secondary | ICD-10-CM | POA: Diagnosis not present

## 2016-06-24 DIAGNOSIS — C44629 Squamous cell carcinoma of skin of left upper limb, including shoulder: Secondary | ICD-10-CM | POA: Diagnosis not present

## 2016-06-24 DIAGNOSIS — L739 Follicular disorder, unspecified: Secondary | ICD-10-CM | POA: Diagnosis not present

## 2016-06-24 DIAGNOSIS — L570 Actinic keratosis: Secondary | ICD-10-CM | POA: Diagnosis not present

## 2016-07-08 DIAGNOSIS — H6983 Other specified disorders of Eustachian tube, bilateral: Secondary | ICD-10-CM | POA: Diagnosis not present

## 2016-07-08 DIAGNOSIS — Z6824 Body mass index (BMI) 24.0-24.9, adult: Secondary | ICD-10-CM | POA: Diagnosis not present

## 2016-08-20 DIAGNOSIS — Z23 Encounter for immunization: Secondary | ICD-10-CM | POA: Diagnosis not present

## 2016-08-25 DIAGNOSIS — H5202 Hypermetropia, left eye: Secondary | ICD-10-CM | POA: Diagnosis not present

## 2016-08-25 DIAGNOSIS — H353132 Nonexudative age-related macular degeneration, bilateral, intermediate dry stage: Secondary | ICD-10-CM | POA: Diagnosis not present

## 2016-08-25 DIAGNOSIS — H40023 Open angle with borderline findings, high risk, bilateral: Secondary | ICD-10-CM | POA: Diagnosis not present

## 2016-09-09 DIAGNOSIS — E890 Postprocedural hypothyroidism: Secondary | ICD-10-CM | POA: Diagnosis not present

## 2016-09-09 DIAGNOSIS — Z85850 Personal history of malignant neoplasm of thyroid: Secondary | ICD-10-CM | POA: Diagnosis not present

## 2016-12-16 DIAGNOSIS — Z85850 Personal history of malignant neoplasm of thyroid: Secondary | ICD-10-CM

## 2016-12-16 HISTORY — DX: Personal history of malignant neoplasm of thyroid: Z85.850

## 2017-08-26 ENCOUNTER — Telehealth: Payer: Self-pay | Admitting: Sports Medicine

## 2018-01-30 DIAGNOSIS — L603 Nail dystrophy: Secondary | ICD-10-CM

## 2018-01-30 DIAGNOSIS — B351 Tinea unguium: Secondary | ICD-10-CM

## 2018-01-30 DIAGNOSIS — M2041 Other hammer toe(s) (acquired), right foot: Secondary | ICD-10-CM

## 2018-01-30 DIAGNOSIS — L84 Corns and callosities: Secondary | ICD-10-CM

## 2018-01-30 HISTORY — DX: Tinea unguium: B35.1

## 2018-01-30 HISTORY — DX: Other hammer toe(s) (acquired), right foot: M20.41

## 2018-01-30 HISTORY — DX: Nail dystrophy: L60.3

## 2018-01-30 HISTORY — DX: Corns and callosities: L84

## 2018-03-21 DIAGNOSIS — Z6824 Body mass index (BMI) 24.0-24.9, adult: Secondary | ICD-10-CM | POA: Diagnosis not present

## 2018-03-21 DIAGNOSIS — H5789 Other specified disorders of eye and adnexa: Secondary | ICD-10-CM | POA: Diagnosis not present

## 2018-03-30 DIAGNOSIS — J309 Allergic rhinitis, unspecified: Secondary | ICD-10-CM | POA: Diagnosis not present

## 2018-03-30 DIAGNOSIS — H6980 Other specified disorders of Eustachian tube, unspecified ear: Secondary | ICD-10-CM | POA: Diagnosis not present

## 2018-03-30 DIAGNOSIS — Z6824 Body mass index (BMI) 24.0-24.9, adult: Secondary | ICD-10-CM | POA: Diagnosis not present

## 2018-04-11 ENCOUNTER — Telehealth (INDEPENDENT_AMBULATORY_CARE_PROVIDER_SITE_OTHER): Payer: Self-pay | Admitting: Orthopaedic Surgery

## 2018-04-11 NOTE — Telephone Encounter (Signed)
Ankle and knee injury Previous yates pt in 2010. I tried to call and see if we could bring pt in @ 2 due to pt not being able to walk

## 2018-04-11 NOTE — Telephone Encounter (Signed)
If you are able to reach patient, you can make him appt in the 0900 slot tomorrow morning that is available. We will not be able to work him in this afternoon with Dr. [name].

## 2018-04-11 NOTE — Telephone Encounter (Signed)
noted 

## 2018-04-11 NOTE — Telephone Encounter (Signed)
Called pt sched appt tomorrow @ 9

## 2018-04-12 ENCOUNTER — Ambulatory Visit (INDEPENDENT_AMBULATORY_CARE_PROVIDER_SITE_OTHER): Payer: Medicare Other

## 2018-04-12 ENCOUNTER — Ambulatory Visit (INDEPENDENT_AMBULATORY_CARE_PROVIDER_SITE_OTHER): Payer: Medicare Other | Admitting: Orthopaedic Surgery

## 2018-04-12 ENCOUNTER — Encounter (INDEPENDENT_AMBULATORY_CARE_PROVIDER_SITE_OTHER): Payer: Self-pay | Admitting: Orthopaedic Surgery

## 2018-04-12 VITALS — BP 135/88 | HR 84 | Ht 71.0 in | Wt 164.0 lb

## 2018-04-12 DIAGNOSIS — M79672 Pain in left foot: Secondary | ICD-10-CM

## 2018-04-12 DIAGNOSIS — M79605 Pain in left leg: Secondary | ICD-10-CM | POA: Diagnosis not present

## 2018-04-12 NOTE — Progress Notes (Signed)
Office Visit Note   Patient: Curtis Ramos           Date of Birth: [birth year]           MRN: [id number] Visit Date: 04/12/2018              Requested by: Leonides Sake, MD [place], [place] [address] PCP: Leonides Sake, MD   Assessment & Plan: Visit Diagnoses:  1. Pain in left leg   2. Pain in left foot     Plan: Patient appears to have had deltoid ligament sprain without fracture.  Lisfranc joint is intact.  We will place him in a cam boot he can weight-bear as tolerated with a walker and after a while work his way onto a cane.  I plan to recheck him in 1 month.  X-ray results were reviewed with the patient's son and family members.  Follow-Up Instructions: Return in about 1 month (around 05/13/2018).   Orders:  Orders Placed This Encounter  Procedures  . XR Foot Complete Left  . XR Tibia/Fibula Left   No orders of the defined types were placed in this encounter.     Procedures: No procedures performed   Clinical Data: No additional findings.   Subjective: Chief Complaint  Patient presents with  . Left Leg - Pain  . Left Foot - Pain    HPI 84 year old male was skydiving doing tandem jump for the first time when he landed instead of getting his feet up they dragged behind him.  He had on work boots and suffered a left foot sprain with swelling without significant ecchymosis.  He is ambulating with a walker and states is getting a little worse as time goes along.  He is here for x-rays of his foot and tibia.  No other injuries at that time.  Review of Systems 14 point review of systems updated noncontributory as it pertains HPI.  Positive for atypical Mycobacterium infection.  Hypothyroidism history of lung mass acute kidney injury otherwise negative.   Objective: Vital Signs: BP 135/88   Pulse 84   Ht _0  (1.803 m)   Wt 164 lb (74.4 kg)   BMI 22.87 kg/m   Physical Exam  Constitutional: He is oriented to person, place, and time.  He appears well-developed and well-nourished.  HENT:  Head: Normocephalic and atraumatic.  Eyes: Pupils are equal, round, and reactive to light. EOM are normal.  Neck: No tracheal deviation present. No thyromegaly present.  Cardiovascular: Normal rate.  Pulmonary/Chest: Effort normal. He has no wheezes.  Abdominal: Soft. Bowel sounds are normal.  Neurological: He is alert and oriented to person, place, and time.  Skin: Skin is warm and dry. Capillary refill takes less than 2 seconds.  Psychiatric: He has a normal mood and affect. His behavior is normal. Judgment and thought content normal.    Ortho Exam patient has some tenderness over the deltoid ligament negative anterior drawer mild tenderness anterior talofibular some tenderness over the midfoot without instability.  Anterior tib gastrocsoleus posterior tib are active.  Skin is intact.  Pulses are intact.  Specialty Comments:  No specialty comments available.  Imaging: Xr Foot Complete Left  Result Date: 04/12/2018 Three-view x-rays left foot obtained and reviewed.  This shows some soft tissue calcification around the capsule of the first MTP joint without acute injury.  Some calcification of the plantar fascia adjacent to the calcaneus.  Negative for acute injury post skydiving accident. Impression: Left  foot x-rays negative for acute injury.  Xr Tibia/fibula Left  Result Date: 04/12/2018 AP lateral oblique x-rays tib-fib are obtained and reviewed.  These are negative for acute fracture.  No evidence of instability. Impression: Normal left tib-fib x-rays.    PMFS History: Patient Active Problem List   Diagnosis Date Noted  . Lung mass   . Occult malignancy ([place])   . Hypercalcemia 12/26/2014  . AKI (acute kidney injury) ([place]) 12/26/2014  . Decreased oral intake 12/26/2014  . Myalgia 11/04/2014  . Arthralgia 11/04/2014  . Hypothyroidism associated with surgical procedure 10/07/2014  . DJD (degenerative joint disease)  10/07/2014  . Atypical mycobacterium infection    Past Medical History:  Diagnosis Date  . Arthritis    "proabably"  . Cancer [facility])    family unaware of this hx on 12/26/2014  . GERD (gastroesophageal reflux disease)   . Hypothyroidism   . Mycobacterium avium complex ([place])   . PONV (postoperative nausea and vomiting)   . Shingles   . Wears glasses     No family history on file.  Past Surgical History:  Procedure Laterality Date  . APPENDECTOMY    . CATARACT EXTRACTION W/ INTRAOCULAR LENS  IMPLANT, BILATERAL Bilateral   . COLONOSCOPY    . INGUINAL HERNIA REPAIR Left   . MASS EXCISION Right 08/27/2014   Procedure: EXCISION MASS RIGHT PALM/INDEX;  Surgeon: Leanora Cover, MD;  Location: [facility];  Service: Orthopedics;  Laterality: Right;  . THYROIDECTOMY  2003   Social History   Occupational History  . Not on file  Tobacco Use  . Smoking status: Never Smoker  . Smokeless tobacco: Never Used  Substance and Sexual Activity  . Alcohol use: No  . Drug use: No  . Sexual activity: Not on file

## 2018-05-10 DIAGNOSIS — H401131 Primary open-angle glaucoma, bilateral, mild stage: Secondary | ICD-10-CM | POA: Diagnosis not present

## 2018-05-10 DIAGNOSIS — H35373 Puckering of macula, bilateral: Secondary | ICD-10-CM | POA: Diagnosis not present

## 2018-05-10 DIAGNOSIS — H353132 Nonexudative age-related macular degeneration, bilateral, intermediate dry stage: Secondary | ICD-10-CM | POA: Diagnosis not present

## 2018-05-10 DIAGNOSIS — H35343 Macular cyst, hole, or pseudohole, bilateral: Secondary | ICD-10-CM | POA: Diagnosis not present

## 2018-05-16 ENCOUNTER — Ambulatory Visit (INDEPENDENT_AMBULATORY_CARE_PROVIDER_SITE_OTHER): Payer: Medicare Other | Admitting: Orthopaedic Surgery

## 2018-05-22 DIAGNOSIS — Z6824 Body mass index (BMI) 24.0-24.9, adult: Secondary | ICD-10-CM | POA: Diagnosis not present

## 2018-05-22 DIAGNOSIS — B372 Candidiasis of skin and nail: Secondary | ICD-10-CM | POA: Diagnosis not present

## 2018-05-22 DIAGNOSIS — H9313 Tinnitus, bilateral: Secondary | ICD-10-CM | POA: Diagnosis not present

## 2018-05-23 ENCOUNTER — Encounter (INDEPENDENT_AMBULATORY_CARE_PROVIDER_SITE_OTHER): Payer: Self-pay | Admitting: Orthopaedic Surgery

## 2018-05-23 ENCOUNTER — Ambulatory Visit (INDEPENDENT_AMBULATORY_CARE_PROVIDER_SITE_OTHER): Payer: Medicare Other | Admitting: Orthopaedic Surgery

## 2018-05-23 VITALS — Ht 71.0 in | Wt 164.0 lb

## 2018-05-23 DIAGNOSIS — S93402S Sprain of unspecified ligament of left ankle, sequela: Secondary | ICD-10-CM | POA: Diagnosis not present

## 2018-05-23 NOTE — Progress Notes (Signed)
Office Visit Note   Patient: Curtis Ramos           Date of Birth: 02-22-1932           MRN: [id number] Visit Date: 05/23/2018              Requested by: Leonides Sake, MD [place],  [id number] PCP: Leonides Sake, MD   Assessment & Plan: Visit Diagnoses:  1. Sprain of left ankle, sequela     Plan: He is happy with the results of treatment office follow-up PRN  Follow-Up Instructions: Return if symptoms worsen or fail to improve.   Orders:  No orders of the defined types were placed in this encounter.  No orders of the defined types were placed in this encounter.     Procedures: No procedures performed   Clinical Data: No additional findings.   Subjective: Chief Complaint  Patient presents with  . Left Leg - Follow-up    HPI 84 year old male returns for follow-up of deltoid ligament sprain without fracture.  Is placed in a cam boot was weightbearing as tolerated states he used it and now is gotten complete resolution of his symptoms he is amatory without limp no pain and is wearing regular shoes.  He is happy with the results of treatment but is saving the boot in case he has some recurrent problems at some point in the future.  Review of Systems 14 point updated unchanged.   Objective: Vital Signs: Ht _0  (1.803 m)   Wt 164 lb (74.4 kg)   BMI 22.87 kg/m   Physical Exam  Constitutional: He is oriented to person, place, and time. He appears well-developed and well-nourished.  HENT:  Head: Normocephalic and atraumatic.  Eyes: Pupils are equal, round, and reactive to light. EOM are normal.  Neck: No tracheal deviation present. No thyromegaly present.  Cardiovascular: Normal rate.  Pulmonary/Chest: Effort normal. He has no wheezes.  Abdominal: Soft. Bowel sounds are normal.  Neurological: He is alert and oriented to person, place, and time.  Skin: Skin is warm and dry. Capillary refill takes less than 2 seconds.    Psychiatric: He has a normal mood and affect. His behavior is normal. Judgment and thought content normal.    Ortho Exam no tenderness normal heel toe gait.  He can heel and toe walk.  No swelling on the lower extremities no rash over exposed skin.  Specialty Comments:  No specialty comments available.  Imaging: No results found.   PMFS History: Patient Active Problem List   Diagnosis Date Noted  . Lung mass   . Occult malignancy ([place])   . Hypercalcemia 12/26/2014  . AKI (acute kidney injury) ([place]) 12/26/2014  . Decreased oral intake 12/26/2014  . Myalgia 11/04/2014  . Arthralgia 11/04/2014  . Hypothyroidism associated with surgical procedure 10/07/2014  . DJD (degenerative joint disease) 10/07/2014  . Atypical mycobacterium infection    Past Medical History:  Diagnosis Date  . Arthritis    "proabably"  . Cancer [facility])    family unaware of this hx on 12/26/2014  . GERD (gastroesophageal reflux disease)   . Hypothyroidism   . Mycobacterium avium complex ([place])   . PONV (postoperative nausea and vomiting)   . Shingles   . Wears glasses     No family history on file.  Past Surgical History:  Procedure Laterality Date  . APPENDECTOMY    . CATARACT EXTRACTION W/ INTRAOCULAR LENS  IMPLANT, BILATERAL Bilateral   .  COLONOSCOPY    . INGUINAL HERNIA REPAIR Left   . MASS EXCISION Right 08/27/2014   Procedure: EXCISION MASS RIGHT PALM/INDEX;  Surgeon: Leanora Cover, MD;  Location: [facility];  Service: Orthopedics;  Laterality: Right;  . THYROIDECTOMY  2003   Social History   Occupational History  . Not on file  Tobacco Use  . Smoking status: Never Smoker  . Smokeless tobacco: Never Used  Substance and Sexual Activity  . Alcohol use: No  . Drug use: No  . Sexual activity: Not on file

## 2018-07-03 DIAGNOSIS — J342 Deviated nasal septum: Secondary | ICD-10-CM | POA: Diagnosis not present

## 2018-07-03 DIAGNOSIS — H9193 Unspecified hearing loss, bilateral: Secondary | ICD-10-CM | POA: Diagnosis not present

## 2018-07-03 DIAGNOSIS — H9313 Tinnitus, bilateral: Secondary | ICD-10-CM | POA: Diagnosis not present

## 2018-07-03 DIAGNOSIS — H903 Sensorineural hearing loss, bilateral: Secondary | ICD-10-CM | POA: Diagnosis not present

## 2018-07-03 DIAGNOSIS — Z85850 Personal history of malignant neoplasm of thyroid: Secondary | ICD-10-CM | POA: Diagnosis not present

## 2018-08-11 DIAGNOSIS — H903 Sensorineural hearing loss, bilateral: Secondary | ICD-10-CM

## 2018-08-11 HISTORY — DX: Sensorineural hearing loss, bilateral: H90.3

## 2018-09-04 DIAGNOSIS — L821 Other seborrheic keratosis: Secondary | ICD-10-CM | POA: Diagnosis not present

## 2018-09-04 DIAGNOSIS — L570 Actinic keratosis: Secondary | ICD-10-CM | POA: Diagnosis not present

## 2018-09-15 DIAGNOSIS — Z23 Encounter for immunization: Secondary | ICD-10-CM | POA: Diagnosis not present

## 2018-09-15 DIAGNOSIS — Z6823 Body mass index (BMI) 23.0-23.9, adult: Secondary | ICD-10-CM | POA: Diagnosis not present

## 2018-09-15 DIAGNOSIS — B353 Tinea pedis: Secondary | ICD-10-CM | POA: Diagnosis not present

## 2018-09-15 DIAGNOSIS — L84 Corns and callosities: Secondary | ICD-10-CM | POA: Diagnosis not present

## 2018-09-26 ENCOUNTER — Ambulatory Visit: Payer: Medicare Other | Admitting: Podiatry

## 2018-09-26 ENCOUNTER — Ambulatory Visit (INDEPENDENT_AMBULATORY_CARE_PROVIDER_SITE_OTHER): Payer: Medicare Other

## 2018-09-26 DIAGNOSIS — M2041 Other hammer toe(s) (acquired), right foot: Secondary | ICD-10-CM

## 2018-09-26 DIAGNOSIS — M7752 Other enthesopathy of left foot: Secondary | ICD-10-CM

## 2018-09-26 DIAGNOSIS — M79674 Pain in right toe(s): Secondary | ICD-10-CM

## 2018-09-26 DIAGNOSIS — Q828 Other specified congenital malformations of skin: Secondary | ICD-10-CM | POA: Diagnosis not present

## 2018-09-26 DIAGNOSIS — M7750 Other enthesopathy of unspecified foot: Secondary | ICD-10-CM

## 2018-09-26 NOTE — Progress Notes (Signed)
  Subjective:  Patient ID: Curtis Ramos, male    DOB: 08-Apr-1932,  MRN: [id number]  No chief complaint on file.  84 y.o. male presents with the above complaint. Reports painful lesions to both feet, worse since he stands on his feet all day.  Review of Systems: Negative except as noted in the HPI. Denies N/V/F/Ch.  Past Medical History:  Diagnosis Date  . Arthritis    "proabably"  . Cancer [facility])    family unaware of this hx on 12/26/2014  . GERD (gastroesophageal reflux disease)   . Hypothyroidism   . Mycobacterium avium complex ([place])   . PONV (postoperative nausea and vomiting)   . Shingles   . Wears glasses     Current Outpatient Medications:  .  latanoprost (XALATAN) 0.005 % ophthalmic solution, , Disp: , Rfl:  .  levothyroxine (SYNTHROID, LEVOTHROID) 175 MCG tablet, Take 175 mcg by mouth daily before breakfast., Disp: , Rfl:  .  Multiple Vitamins-Minerals (ONE-A-DAY VITACRAVES IMMUNITY) CHEW, Chew 2 tablets by mouth daily., Disp: , Rfl:  .  oxyCODONE-acetaminophen (PERCOCET/ROXICET) 5-325 MG per tablet, Take 1 tablet by mouth every 4 (four) hours as needed for severe pain., Disp: , Rfl:  .  pregabalin (LYRICA) 100 MG capsule, Take 100 mg by mouth 2 (two) times daily. , Disp: , Rfl:  .  valACYclovir (VALTREX) 500 MG tablet, Take 500 mg by mouth daily. , Disp: , Rfl:  .  zinc gluconate 50 MG tablet, Take 50 mg by mouth daily., Disp: , Rfl:   Social History   Tobacco Use  Smoking Status Never Smoker  Smokeless Tobacco Never Used    No Known Allergies Objective:  There were no vitals filed for this visit. There is no height or weight on file to calculate BMI. Constitutional Well developed. Well nourished.  Vascular Dorsalis pedis pulses palpable bilaterally. Posterior tibial pulses palpable bilaterally. Capillary refill normal to all digits.  No cyanosis or clubbing noted. Pedal hair growth normal.  Neurologic Normal speech. Oriented to person, place, and  time. Epicritic sensation to light touch grossly present bilaterally.  Dermatologic Nails well groomed and normal in appearance. No open wounds. HPK L submet 1,5 HPK R 3rd toe distal  Orthopedic: Normal joint ROM without pain or crepitus bilaterally. Hammertoes bilat, semi-reducible. POP L 5th MPJ   Radiographs: Taken and reviewed no underlying fracture or dislocation Assessment:   1. Capsulitis of metatarsophalangeal (MTP) joint   2. Hammer toe of right foot   3. Porokeratosis   4. Pain in toe of right foot    Plan:  Patient was evaluated and treated and all questions answered.  Capsulitis L 5th MPJ -XR reviewed -Injection as below.  Procedure: Joint Injection Location: Left 5th MPJ joint Skin Prep: Alcohol. Injectate: 0.5 cc 1% lidocaine plain, 0.5 cc dexamethasone phosphate. Disposition: Patient tolerated procedure well. Injection site dressed with a band-aid.   Hammertoe R 3rd Toe -Discussed conservative vs surgical therapy for this issue. -Would benefit from flexor tenotomy to alleviate contracture and assist in resolving pre-ulcerative callus. -Will perform procedure at next visit.  Porokeratoses -Lesions debrided x3  Return in about 2 weeks (around 10/10/2018) for Flexor Tenotomy Procedure R 3rd Toe.

## 2018-09-27 ENCOUNTER — Other Ambulatory Visit: Payer: Self-pay | Admitting: Podiatry

## 2018-09-27 DIAGNOSIS — M2041 Other hammer toe(s) (acquired), right foot: Secondary | ICD-10-CM

## 2018-09-27 DIAGNOSIS — M7750 Other enthesopathy of unspecified foot: Secondary | ICD-10-CM

## 2018-10-10 ENCOUNTER — Ambulatory Visit: Payer: Medicare Other | Admitting: Podiatry

## 2018-10-10 DIAGNOSIS — M2041 Other hammer toe(s) (acquired), right foot: Secondary | ICD-10-CM | POA: Diagnosis not present

## 2018-10-10 NOTE — Progress Notes (Signed)
  Subjective:  Patient ID: Curtis Ramos, male    DOB: 05-Jan-1932,  MRN: [id number]  Chief Complaint  Patient presents with  . Procedure    FLexor tenotomy of Right 3rd toe   84 y.o. male returns today for planned flexor tenotomy of the right 3rd digit.  Objective:  There were no vitals filed for this visit.  General AA&O x3. Normal mood and affect.  Vascular Pedal pulses palpable.  Neurologic Epicritic sensation grossly intact.  Dermatologic Pre-ulcerative callus at the tip of the right, 3rd toe  Orthopedic: Semi-reducible hammertoe deformity right, 3rd toe    Assessment & Plan:  Patient was evaluated and treated and all questions answered.  Hammertoe R 3rd toe with pre-ulcerative callus -Flexor tenotomy as below. -Advised to remove the dressing in 24 hours and apply a band-aid and triple abx ointment every day thereafter.  Procedure: Flexor Tenotomy Indication for Procedure: toe with semi-reducible hammertoe with distal tip ulceration. Flexor tenotomy indicated to alleviate contracture, reduce pressure, and enhance healing of the ulceration. Location: right, 3rd toe Anesthesia: Lidocaine 1% plain; 1.5 mL and Marcaine 0.5% plain; 1.5 mL digital block Instrumentation: 18 g needle  Technique: The toe was anesthetized as above and prepped in the usual fashion. The toe was exsanquinated and a tourniquet was secured at the base of the toe. An 18g needle was then used to percutaneously release the flexor tendon at the plantar surface of the toe with noted release of the hammertoe deformity. The incision was then dressed with antibiotic ointment and band-aid. Compression splint dressing applied. Patient tolerated the procedure well. Dressing: Dry, sterile, compression dressing. Disposition: Patient tolerated procedure well. Patient to return in 1 week for follow-up.   No follow-ups on file.

## 2018-10-24 ENCOUNTER — Ambulatory Visit: Payer: Medicare Other | Admitting: Podiatry

## 2018-10-24 DIAGNOSIS — M722 Plantar fascial fibromatosis: Secondary | ICD-10-CM | POA: Diagnosis not present

## 2018-10-24 DIAGNOSIS — M21962 Unspecified acquired deformity of left lower leg: Secondary | ICD-10-CM

## 2018-10-24 DIAGNOSIS — M7742 Metatarsalgia, left foot: Secondary | ICD-10-CM

## 2018-10-24 DIAGNOSIS — R29898 Other symptoms and signs involving the musculoskeletal system: Secondary | ICD-10-CM

## 2018-10-24 DIAGNOSIS — M2042 Other hammer toe(s) (acquired), left foot: Secondary | ICD-10-CM | POA: Diagnosis not present

## 2018-10-24 DIAGNOSIS — L909 Atrophic disorder of skin, unspecified: Secondary | ICD-10-CM

## 2018-10-24 NOTE — Progress Notes (Signed)
  Subjective:  Patient ID: Curtis Ramos, male    DOB: 26-Oct-1932,  MRN: [id number]  Chief Complaint  Patient presents with  . flexor tenotomy    F/U R 3rd toe flexor tenotomy Pt. states," toe is doing fine, doesn't bother me at all." Tx: abx, and bandaid -pt denies N/V/F/Ch   84 y.o. male presents with the above complaint. New complaint of painful hammertoes the left foot.  Thinks that his shoes are making them worse because of being tight.  Review of Systems: Negative except as noted in the HPI. Denies N/V/F/Ch.  Past Medical History:  Diagnosis Date  . Arthritis    "proabably"  . Cancer [facility])    family unaware of this hx on 12/26/2014  . GERD (gastroesophageal reflux disease)   . Hypothyroidism   . Mycobacterium avium complex ([place])   . PONV (postoperative nausea and vomiting)   . Shingles   . Wears glasses     Current Outpatient Medications:  .  [employer] CLOTRIMAZOLE 1 % cream, APPLY 1 (ONE) APPLICATION TO BOTH FEET TWICE A DAY FOR 2-4 WEEKS, Disp: , Rfl: 0 .  latanoprost (XALATAN) 0.005 % ophthalmic solution, , Disp: , Rfl:  .  levothyroxine (SYNTHROID, LEVOTHROID) 175 MCG tablet, Take 175 mcg by mouth daily before breakfast., Disp: , Rfl:  .  Multiple Vitamins-Minerals (ONE-A-DAY VITACRAVES IMMUNITY) CHEW, Chew 2 tablets by mouth daily., Disp: , Rfl:  .  oxyCODONE-acetaminophen (PERCOCET/ROXICET) 5-325 MG per tablet, Take 1 tablet by mouth every 4 (four) hours as needed for severe pain., Disp: , Rfl:  .  pregabalin (LYRICA) 100 MG capsule, Take 100 mg by mouth 2 (two) times daily. , Disp: , Rfl:  .  valACYclovir (VALTREX) 500 MG tablet, Take 500 mg by mouth daily. , Disp: , Rfl:  .  zinc gluconate 50 MG tablet, Take 50 mg by mouth daily., Disp: , Rfl:   Social History   Tobacco Use  Smoking Status Never Smoker  Smokeless Tobacco Never Used    No Known Allergies Objective:  There were no vitals filed for this visit. There is no height or weight on file to calculate  BMI. Constitutional Well developed. Well nourished.  Vascular Dorsalis pedis pulses palpable bilaterally. Posterior tibial pulses palpable bilaterally. Capillary refill normal to all digits.  No cyanosis or clubbing noted. Pedal hair growth normal.  Neurologic Normal speech. Oriented to person, place, and time. Epicritic sensation to light touch grossly present bilaterally.  Dermatologic Nails well groomed and normal in appearance. No open wounds. HPK L submet 1,5  Orthopedic: Normal joint ROM without pain or crepitus bilaterally. Hammertoes bilat, semi-reducible, except for R 3rd tow which is now rectus.  POP 3rd/4th toes with HT formatio   Radiographs: Taken and reviewed no underlying fracture or dislocation Assessment:   1. Hammertoe of left foot   2. Fat pad atrophy of foot   3. Metatarsalgia, left foot   4. Metatarsal deformity, left    Plan:  Patient was evaluated and treated and all questions answered.  Hammertoes Left Foot -Dispensed silicone spacer -Discussed roomy shoegear  Fat Pad Atrophy -Powersteps dispensed.   Hammertoe R 3rd Toe -S/p Tenotomy feeling better denies pain.  Porokeratoses -Lesions debrided x3  Return if symptoms worsen or fail to improve.

## 2018-11-16 DIAGNOSIS — Z6823 Body mass index (BMI) 23.0-23.9, adult: Secondary | ICD-10-CM | POA: Diagnosis not present

## 2018-11-16 DIAGNOSIS — J069 Acute upper respiratory infection, unspecified: Secondary | ICD-10-CM | POA: Diagnosis not present

## 2018-12-06 DIAGNOSIS — H52223 Regular astigmatism, bilateral: Secondary | ICD-10-CM | POA: Diagnosis not present

## 2018-12-06 DIAGNOSIS — H35343 Macular cyst, hole, or pseudohole, bilateral: Secondary | ICD-10-CM | POA: Diagnosis not present

## 2018-12-06 DIAGNOSIS — H353132 Nonexudative age-related macular degeneration, bilateral, intermediate dry stage: Secondary | ICD-10-CM | POA: Diagnosis not present

## 2018-12-06 DIAGNOSIS — H401131 Primary open-angle glaucoma, bilateral, mild stage: Secondary | ICD-10-CM | POA: Diagnosis not present

## 2018-12-15 DIAGNOSIS — R0981 Nasal congestion: Secondary | ICD-10-CM | POA: Diagnosis not present

## 2019-01-12 DIAGNOSIS — R0981 Nasal congestion: Secondary | ICD-10-CM | POA: Diagnosis not present

## 2019-01-12 DIAGNOSIS — J019 Acute sinusitis, unspecified: Secondary | ICD-10-CM | POA: Diagnosis not present

## 2019-01-22 DIAGNOSIS — Z6823 Body mass index (BMI) 23.0-23.9, adult: Secondary | ICD-10-CM | POA: Diagnosis not present

## 2019-01-22 DIAGNOSIS — R911 Solitary pulmonary nodule: Secondary | ICD-10-CM | POA: Diagnosis not present

## 2019-01-22 DIAGNOSIS — R0602 Shortness of breath: Secondary | ICD-10-CM | POA: Diagnosis not present

## 2019-01-22 DIAGNOSIS — R0981 Nasal congestion: Secondary | ICD-10-CM | POA: Diagnosis not present

## 2019-01-25 DIAGNOSIS — R911 Solitary pulmonary nodule: Secondary | ICD-10-CM | POA: Diagnosis not present

## 2019-01-25 DIAGNOSIS — J90 Pleural effusion, not elsewhere classified: Secondary | ICD-10-CM | POA: Diagnosis not present

## 2019-01-25 DIAGNOSIS — I517 Cardiomegaly: Secondary | ICD-10-CM | POA: Diagnosis not present

## 2019-02-27 DIAGNOSIS — R0600 Dyspnea, unspecified: Secondary | ICD-10-CM | POA: Diagnosis not present

## 2019-03-05 DIAGNOSIS — R0600 Dyspnea, unspecified: Secondary | ICD-10-CM | POA: Diagnosis not present

## 2019-03-05 DIAGNOSIS — J301 Allergic rhinitis due to pollen: Secondary | ICD-10-CM | POA: Diagnosis not present

## 2019-03-05 DIAGNOSIS — R911 Solitary pulmonary nodule: Secondary | ICD-10-CM | POA: Diagnosis not present

## 2019-03-05 LAB — PULMONARY FUNCTION TEST
DLCO: 21.92 ml/mmHg sec
FEV1/FVC: 85 %
FEV1: 2.56 L
FVC: 3.02 L
TLC: 5.19

## 2019-03-06 DIAGNOSIS — L570 Actinic keratosis: Secondary | ICD-10-CM | POA: Diagnosis not present

## 2019-03-06 DIAGNOSIS — L821 Other seborrheic keratosis: Secondary | ICD-10-CM | POA: Diagnosis not present

## 2019-03-06 DIAGNOSIS — D485 Neoplasm of uncertain behavior of skin: Secondary | ICD-10-CM | POA: Diagnosis not present

## 2019-03-06 DIAGNOSIS — L578 Other skin changes due to chronic exposure to nonionizing radiation: Secondary | ICD-10-CM | POA: Diagnosis not present

## 2019-03-15 ENCOUNTER — Encounter (HOSPITAL_COMMUNITY): Payer: Self-pay | Admitting: Emergency Medicine

## 2019-03-15 ENCOUNTER — Emergency Department (HOSPITAL_COMMUNITY): Payer: Medicare Other

## 2019-03-15 ENCOUNTER — Telehealth: Payer: Self-pay | Admitting: Cardiology

## 2019-03-15 ENCOUNTER — Other Ambulatory Visit: Payer: Self-pay

## 2019-03-15 ENCOUNTER — Telehealth: Payer: Self-pay | Admitting: *Deleted

## 2019-03-15 ENCOUNTER — Emergency Department (HOSPITAL_COMMUNITY)
Admission: EM | Admit: 2019-03-15 | Discharge: 2019-03-15 | Disposition: A | Discharge status: Home / Self Care | Payer: Medicare Other | Attending: Emergency Medicine | Admitting: Emergency Medicine

## 2019-03-15 DIAGNOSIS — E039 Hypothyroidism, unspecified: Secondary | ICD-10-CM | POA: Insufficient documentation

## 2019-03-15 DIAGNOSIS — Z859 Personal history of malignant neoplasm, unspecified: Secondary | ICD-10-CM | POA: Diagnosis not present

## 2019-03-15 DIAGNOSIS — Z79899 Other long term (current) drug therapy: Secondary | ICD-10-CM | POA: Insufficient documentation

## 2019-03-15 DIAGNOSIS — R0602 Shortness of breath: Secondary | ICD-10-CM | POA: Diagnosis not present

## 2019-03-15 LAB — COMPREHENSIVE METABOLIC PANEL
ALT: 26 U/L (ref 0–44)
AST: 25 U/L (ref 15–41)
Albumin: 3.7 g/dL (ref 3.5–5.0)
Alkaline Phosphatase: 132 U/L — ABNORMAL HIGH (ref 38–126)
Anion gap: 10 (ref 5–15)
BUN: 20 mg/dL (ref 8–23)
CO2: 21 mmol/L — ABNORMAL LOW (ref 22–32)
Calcium: 9.8 mg/dL (ref 8.9–10.3)
Chloride: 105 mmol/L (ref 98–111)
Creatinine, Ser: 1.04 mg/dL (ref 0.61–1.24)
GFR calc Af Amer: >60 mL/min (ref >60)
GFR calc non Af Amer: >60 mL/min (ref >60)
Glucose, Bld: 108 mg/dL — ABNORMAL HIGH (ref 70–99)
Potassium: 4 mmol/L (ref 3.5–5.1)
Sodium: 136 mmol/L (ref 135–145)
Total Bilirubin: 1.4 mg/dL — ABNORMAL HIGH (ref 0.3–1.2)
Total Protein: 6.7 g/dL (ref 6.5–8.1)

## 2019-03-15 LAB — CBC
HCT: 41.8 % (ref 39.0–52.0)
Hemoglobin: 14.2 g/dL (ref 13.0–17.0)
MCH: 35.4 pg — ABNORMAL HIGH (ref 26.0–34.0)
MCHC: 34 g/dL (ref 30.0–36.0)
MCV: 104.2 fL — ABNORMAL HIGH (ref 80.0–100.0)
Platelets: 158 10*3/uL (ref 150–400)
RBC: 4.01 MIL/uL — ABNORMAL LOW (ref 4.22–5.81)
RDW: 12.8 % (ref 11.5–15.5)
WBC: 9.9 10*3/uL (ref 4.0–10.5)
nRBC: 0 % (ref 0.0–0.2)

## 2019-03-15 LAB — BRAIN NATRIURETIC PEPTIDE: B Natriuretic Peptide: 1522.3 pg/mL — ABNORMAL HIGH (ref 0.0–100.0)

## 2019-03-15 LAB — TROPONIN I: Troponin I: <0.03 ng/mL (ref <0.03)

## 2019-03-15 MED ORDER — FUROSEMIDE 20 MG PO TABS
20.0000 mg | ORAL_TABLET | Freq: Once | ORAL | Status: AC
  Administered 2019-03-15: 20 mg via ORAL
  Filled 2019-03-15: qty 1

## 2019-03-15 MED ORDER — FUROSEMIDE 20 MG PO TABS: 20.0000 mg | ORAL_TABLET | Freq: Every day | ORAL | 0 refills | Status: DC

## 2019-03-15 NOTE — ED Triage Notes (Signed)
Patient reports shortness of breath x 3-4 months, but significantly worsened last night. Dyspnea on exertion, respirations labored with moving around and even talking. Denies fevers/chills, cough, chest pain, dizziness. No recent travel or sick contacts. Resp e/u at rest, skin w/d.

## 2019-03-15 NOTE — ED Notes (Signed)
MD at bedside. 

## 2019-03-15 NOTE — Telephone Encounter (Signed)
Pt called with extreme trouble breathing and said [facility] was supposed to have sent over a referral to be seen by [place]. There is nothing in the chart. I called [facility] and had to leave a msg for them to call us back about this pt. Dr. Is Renita Papa Chodri.

## 2019-03-15 NOTE — Telephone Encounter (Signed)
We received very long phone calls today from patient's son Curtis Ramos who wanted [place] to see his father immediately.  Apparently his father who is 85 years old and does have history of some chronic lung condition as well as some pulmonary nodule and for which he is being followed by a pulmonologist locally.  He has been experiencing shortness of breath for months however within last few weeks especially last few day this shortness of breath became quite significant he was not able to do much without easily getting short of breath.  On top of that for last few nights he was not able to sleep laying flat because of shortness of breath last night was absolutely miserable he got very scared last night he was afraid that he is going to die.  He was not able to breathe at all he spent all night sitting in the chair.  Denies having any swelling of lower extremities.  There is no chest pain tightness squeezing pressure burning chest does feel poorly.  Apparently his weight is stable but I am not sure about this since his son is not sure how often he is better check his weight.  Overall he is very skinny.  I did receive some records from his pulmonologist and he apparently he did have echocardiogram done echocardiogram showed ejection fraction 20 to 25%.  And apparently when he had echocardiogram done he is was in atrial fibrillation.  There is no EKG.  Also description of his cardiovascular system examination showed regular rhythm without any heart murmur. Past medical history significant for pleural effusion and lung nodule hypothyroidism also thyroidectomy that he did have thyroid cancer years ago. Apparently never smoked. Overall considering his condition I think he would be best served by being admitted to the hospital with aggressive management of decompensated congestive heart failure, cardiomyopathy with ejection fraction 20 to 25% and possibly atrial fibrillation.  He was referred to [facility] emergency room for future  evaluation.  I spoke to his son Curtis Ramos whose phone number is [phone].

## 2019-03-15 NOTE — ED Provider Notes (Signed)
[facility] EMERGENCY DEPARTMENT Provider Note   CSN: [id number] Arrival date & time: 03/15/19  1350    History   Chief Complaint Chief Complaint  Patient presents with  . Shortness of Breath    HPI Curtis Ramos is a 85 y.o. male.     HPI Patient is an 85 year old male who reports worsening shortness of breath over the past 3 to 4 months but reports worsening of his symptoms last night.  No fevers or chills.  Reports dyspnea with exertion.  Denies orthopnea.  No history of congestive heart failure.  He reports he has seen the lung specialist and had pulmonary function tests which demonstrate no significant abnormality per the patient.  This was done in [place].  He also reports an echocardiogram without significant abnormality.  Denies chest pain chest tightness.  No lightheadedness.  No recent travel or sick contacts.  No known exposure to COVID-19 or patients under investigation for COVID-19.  No new rash.  He reports he felt much worse last night.  He is improved currently as compared to last night but reports still short of breath with exertion.  No unilateral leg swelling.  No history of DVT or pulmonary embolism.   Past Medical History:  Diagnosis Date  . Arthritis    "proabably"  . Cancer [facility])    family unaware of this hx on 12/26/2014  . GERD (gastroesophageal reflux disease)   . Hypothyroidism   . Mycobacterium avium complex ([place])   . PONV (postoperative nausea and vomiting)   . Shingles   . Wears glasses     Patient Active Problem List   Diagnosis Date Noted  . Lung mass   . Occult malignancy ([place])   . Hypercalcemia 12/26/2014  . AKI (acute kidney injury) ([place]) 12/26/2014  . Decreased oral intake 12/26/2014  . Myalgia 11/04/2014  . Arthralgia 11/04/2014  . Hypothyroidism associated with surgical procedure 10/07/2014  . DJD (degenerative joint disease) 10/07/2014  . Atypical mycobacterium infection     Past Surgical History:  Procedure  Laterality Date  . APPENDECTOMY    . CATARACT EXTRACTION W/ INTRAOCULAR LENS  IMPLANT, BILATERAL Bilateral   . COLONOSCOPY    . INGUINAL HERNIA REPAIR Left   . MASS EXCISION Right 08/27/2014   Procedure: EXCISION MASS RIGHT PALM/INDEX;  Surgeon: Leanora Cover, MD;  Location: [facility];  Service: Orthopedics;  Laterality: Right;  . THYROIDECTOMY  2003        Home Medications    Prior to Admission medications   Medication Sig Start Date End Date Taking? Authorizing Provider  [employer]S CLOTRIMAZOLE 1 % cream APPLY 1 (ONE) APPLICATION TO BOTH FEET TWICE A DAY FOR 2-4 WEEKS 09/15/18   [provider]  latanoprost (XALATAN) 0.005 % ophthalmic solution  03/15/18   [provider]  levothyroxine (SYNTHROID, LEVOTHROID) 175 MCG tablet Take 175 mcg by mouth daily before breakfast.    [provider]  Multiple Vitamins-Minerals (ONE-A-DAY VITACRAVES IMMUNITY) CHEW Chew 2 tablets by mouth daily.    [provider]  oxyCODONE-acetaminophen (PERCOCET/ROXICET) 5-325 MG per tablet Take 1 tablet by mouth every 4 (four) hours as needed for severe pain.    [provider]  pregabalin (LYRICA) 100 MG capsule Take 100 mg by mouth 2 (two) times daily.     [provider]  valACYclovir (VALTREX) 500 MG tablet Take 500 mg by mouth daily.  04/10/15   [provider]  zinc gluconate 50 MG tablet Take  50 mg by mouth daily.    [provider]    Family History No family history on file.  Social History Social History   Tobacco Use  . Smoking status: Never Smoker  . Smokeless tobacco: Never Used  Substance Use Topics  . Alcohol use: No  . Drug use: No     Allergies   Patient has no known allergies.   Review of Systems Review of Systems  All other systems reviewed and are negative.    Physical Exam Updated Vital Signs BP 135/84 (BP Location: Left Arm) Comment: Simultaneous filing. User may not have seen previous data.  Comment (BP Location): Simultaneous filing. User may not have seen previous data.  Pulse 96 Comment: Simultaneous filing. User may not have seen previous data.  Temp 98.3 F (36.8 C) (Oral) Comment: Simultaneous filing. User may not have seen previous data. Comment (Src): Simultaneous filing. User may not have seen previous data.  Resp 18 Comment: Simultaneous filing. User may not have seen previous data.  SpO2 92% Comment: Simultaneous filing. User may not have seen previous data.  Physical Exam Vitals signs and nursing note reviewed.  Constitutional:      Appearance: He is well-developed.  HENT:     Head: Normocephalic and atraumatic.  Neck:     Musculoskeletal: Normal range of motion.  Cardiovascular:     Rate and Rhythm: Normal rate and regular rhythm.     Heart sounds: Normal heart sounds.  Pulmonary:     Effort: Pulmonary effort is normal. No respiratory distress.     Breath sounds: Normal breath sounds.  Abdominal:     General: There is no distension.     Palpations: Abdomen is soft.     Tenderness: There is no abdominal tenderness.  Musculoskeletal: Normal range of motion.     Right lower leg: He exhibits no tenderness. No edema.     Left lower leg: He exhibits no tenderness. No edema.  Skin:    General: Skin is warm and dry.  Neurological:     Mental Status: He is alert and oriented to person, place, and time.  Psychiatric:        Judgment: Judgment normal.      ED Treatments / Results  Labs (all labs ordered are listed, but only abnormal results are displayed) Labs Reviewed  CBC - Abnormal; Notable for the following components:      Result Value   RBC 4.01 (*)    MCV 104.2 (*)    MCH 35.4 (*)    All other components within normal limits  COMPREHENSIVE METABOLIC PANEL - Abnormal; Notable for the following components:   CO2 21 (*)    Glucose, Bld 108 (*)    Alkaline Phosphatase 132 (*)    Total Bilirubin 1.4 (*)    All other components within normal limits   BRAIN NATRIURETIC PEPTIDE - Abnormal; Notable for the following components:   B Natriuretic Peptide 1,522.3 (*)    All other components within normal limits  TROPONIN I    EKG EKG Interpretation  Date/Time:  Thursday March 15 2019 14:06:41 EDT Ventricular Rate:  98 PR Interval:    QRS Duration: 117 QT Interval:  365 QTC Calculation: 466 R Axis:   -50 Text Interpretation:  Atrial fibrillation Ventricular premature complex Left anterior fascicular block Anterior infarct, old Nonspecific T abnormalities, lateral leads No significant change was found Confirmed by Jola Schmidt [phone]) on 03/15/2019 2:26:59 PM   Radiology Dg Chest 2 View  Result Date: 03/15/2019 CLINICAL DATA:  Shortness of breath EXAM: CHEST - 2 VIEW COMPARISON:  None. FINDINGS: Small pleural effusions and bibasilar atelectasis. Mild cardiomegaly. No pulmonary edema or focal consolidation. Multiple surgical clips at the anterior neck. IMPRESSION: Small pleural effusions with basilar atelectasis. Electronically Signed   By: Ulyses Jarred M.D.   On: 03/15/2019 14:43    Procedures Procedures (including critical care time)  Medications Ordered in ED Medications - No data to display   Initial Impression / Assessment and Plan / ED Course  I have reviewed the triage vital signs and the nursing notes.  Pertinent labs & imaging results that were available during my care of the patient were reviewed by me and considered in my medical decision making (see chart for details).        No fevers or chills.  May represent some volume overload.  He has been told before in the past that he has some weak squeeze of his heart based on echocardiogram.  I do not have these records available.  He will be given a dose of Lasix in the emergency department.  Discharged home with 5 days of Lasix.  I have instructed him to follow-up with cardiology as an outpatient as well as involve his primary care physician.  Of late his primary care  physician can see him the first of next week.  In the interim he is to be discharged home at this time and return to the emergency department for any new or worsening symptoms.  I do not think he needs acute hospitalization at this time.  In the current setting of a pandemic I think hospitalization could lead to increased risk without any increased benefit.  Patient is agreeable to this and would prefer to go home.  He is encouraged to return to the ER as needed  Final Clinical Impressions(s) / ED Diagnoses   Final diagnoses:  SOB (shortness of breath)    ED Discharge Orders    None       Jola Schmidt, MD 03/15/19 1616

## 2019-03-16 ENCOUNTER — Telehealth: Payer: Self-pay | Admitting: *Deleted

## 2019-03-16 NOTE — Telephone Encounter (Signed)
Pt has a visit with Dr. Geraldo Pitter 5/5. It is set up as virtual but didn't know if he needs to be seen in the office. Let son [name]) know that we would contact him if appt changes to O.V.   Cardiac Questionnaire:    Since your last visit or hospitalization:    1. Have you been having new or worsening chest pain? NO   2. Have you been having new or worsening shortness of breath? YES 3. Have you been having new or worsening leg swelling, wt gain, or increase in abdominal girth (pants fitting more tightly)? NO   4. Have you had any passing out spells? NO    *A YES to any of these questions would result in the appointment being kept. *If all the answers to these questions are NO, we should indicate that given the current situation regarding the worldwide coronarvirus pandemic, at the recommendation of the CDC, we are looking to limit gatherings in our waiting area, and thus will reschedule their appointment beyond four weeks from today.   _____________   [id number] Pre-Screening Questions:  . Do you currently have a fever? NO (yes = cancel and refer to pcp for e-visit) . Have you recently travelled on a cruise, internationally, or to [place], [place], [place], [place], [place], or [place], [place] [employer]) ? NO (yes = cancel, stay home, monitor symptoms, and contact pcp or initiate e-visit if symptoms develop) . Have you been in contact with someone that is currently pending confirmation of Covid19 testing or has been confirmed to have the [place] virus?  NO (yes = cancel, stay home, away from tested individual, monitor symptoms, and contact pcp or initiate e-visit if symptoms develop) . Are you currently experiencing fatigue or cough? NO (yes = pt should be prepared to have a mask placed at the time of their visit).         YOUR CARDIOLOGY TEAM HAS ARRANGED FOR AN E-VISIT FOR YOUR APPOINTMENT - PLEASE REVIEW IMPORTANT INFORMATION BELOW SEVERAL DAYS PRIOR TO YOUR APPOINTMENT  Due to the recent COVID-19 pandemic,  we are transitioning in-person office visits to tele-medicine visits in an effort to decrease unnecessary exposure to our patients, their families, and staff. These visits are billed to your insurance just like a normal visit is. We also encourage you to sign up for MyChart if you have not already done so. You will need a smartphone if possible. For patients that do not have this, we can still complete the visit using a regular telephone but do prefer a smartphone to enable video when possible. You may have a family member that lives with you that can help. If possible, we also ask that you have a blood pressure cuff and scale at home to measure your blood pressure, heart rate and weight prior to your scheduled appointment. Patients with clinical needs that need an in-person evaluation and testing will still be able to come to the office if absolutely necessary. If you have any questions, feel free to call our office.     YOUR PROVIDER WILL BE USING THE FOLLOWING PLATFORM TO COMPLETE YOUR VISIT: Staff: Please delete this text and fill in MyChart/Doximity/Doxy.Me  . IF USING MYCHART - How to Download the MyChart App to Your SmartPhone   - If Apple, go to [employer] and type in MyChart in the search bar and download the app. If Android, ask patient to go to [employer] and type in [place] in the search bar and download  the app. The app is free but as with any other app downloads, your phone may require you to verify saved payment information or Apple/Android password.  - You will need to then log into the app with your MyChart username and password, and select [facility] as your healthcare provider to link the account.  - When it is time for your visit, go to the MyChart app, find appointments, and click Begin Video Visit. Be sure to Select Allow for your device to access the Microphone and Camera for your visit. You will then be connected, and your provider will be with you shortly.  **If you have  any issues connecting or need assistance, please contact MyChart service desk [phone])**  **If using a computer, in order to ensure the best quality for your visit, you will need to use either of the following Internet Browsers: Insurance underwriter or [employer]**  . IF USING DOXIMITY or DOXY.ME - The staff will give you instructions on receiving your link to join the meeting the day of your visit.      2-3 DAYS BEFORE YOUR APPOINTMENT  You will receive a telephone call from one of our [facility] team members - your caller ID may say "Unknown caller." If this is a video visit, we will walk you through how to get the video launched on your phone. We will remind you check your blood pressure, heart rate and weight prior to your scheduled appointment. If you have an Apple Watch or Kardia, please upload any pertinent ECG strips the day before or morning of your appointment to [place]. Our staff will also make sure you have reviewed the consent and agree to move forward with your scheduled tele-health visit.     THE DAY OF YOUR APPOINTMENT  Approximately 15 minutes prior to your scheduled appointment, you will receive a telephone call from one of [facility] team - your caller ID may say "Unknown caller."  Our staff will confirm medications, vital signs for the day and any symptoms you may be experiencing. Please have this information available prior to the time of visit start. It may also be helpful for you to have a pad of paper and pen handy for any instructions given during your visit. They will also walk you through joining the smartphone meeting if this is a video visit.    CONSENT FOR TELE-HEALTH VISIT - PLEASE REVIEW  I hereby voluntarily request, consent and authorize [facility] and its employed or contracted physicians, physician assistants, nurse practitioners or other licensed health care professionals (the Practitioner), to provide me with telemedicine health care  services (the "Services") as deemed necessary by the treating Practitioner. I acknowledge and consent to receive the Services by the Practitioner via telemedicine. I understand that the telemedicine visit will involve communicating with the Practitioner through live audiovisual communication technology and the disclosure of certain medical information by electronic transmission. I acknowledge that I have been given the opportunity to request an in-person assessment or other available alternative prior to the telemedicine visit and am voluntarily participating in the telemedicine visit.  I understand that I have the right to withhold or withdraw my consent to the use of telemedicine in the course of my care at any time, without affecting my right to future care or treatment, and that the Practitioner or I may terminate the telemedicine visit at any time. I understand that I have the right to inspect all information obtained and/or recorded in the course of the telemedicine  visit and may receive copies of available information for a reasonable fee.  I understand that some of the potential risks of receiving the Services via telemedicine include:  Marland Kitchen Delay or interruption in medical evaluation due to technological equipment failure or disruption; . Information transmitted may not be sufficient (e.g. poor resolution of images) to allow for appropriate medical decision making by the Practitioner; and/or  . In rare instances, security protocols could fail, causing a breach of personal health information.  Furthermore, I acknowledge that it is my responsibility to provide information about my medical history, conditions and care that is complete and accurate to the best of my ability. I acknowledge that Practitioner's advice, recommendations, and/or decision may be based on factors not within their control, such as incomplete or inaccurate data provided by me or distortions of diagnostic images or specimens that may  result from electronic transmissions. I understand that the practice of medicine is not an exact science and that Practitioner makes no warranties or guarantees regarding treatment outcomes. I acknowledge that I will receive a copy of this consent concurrently upon execution via email to the email address I last provided but may also request a printed copy by calling the office of [facility].    I understand that my insurance will be billed for this visit.   I have read or had this consent read to me. . I understand the contents of this consent, which adequately explains the benefits and risks of the Services being provided via telemedicine.  . I have been provided ample opportunity to ask questions regarding this consent and the Services and have had my questions answered to my satisfaction. . I give my informed consent for the services to be provided through the use of telemedicine in my medical care  By participating in this telemedicine visit I agree to the above. Pt gives consent.

## 2019-03-16 NOTE — Telephone Encounter (Signed)
Called patient back he reports he has had shortness of breath for over 3 weeks now, he denies any swelling or weight gain. Patient was recently seen in the emergency department and they started him on lasix 20 mg daily. Dr. Agustin Cree aware of this patient situation and will route to him for further instruction.

## 2019-03-19 ENCOUNTER — Telehealth (INDEPENDENT_AMBULATORY_CARE_PROVIDER_SITE_OTHER): Payer: Medicare Other | Admitting: Cardiology

## 2019-03-19 ENCOUNTER — Encounter: Payer: Self-pay | Admitting: Cardiology

## 2019-03-19 ENCOUNTER — Other Ambulatory Visit: Payer: Self-pay

## 2019-03-19 DIAGNOSIS — I5043 Acute on chronic combined systolic (congestive) and diastolic (congestive) heart failure: Secondary | ICD-10-CM

## 2019-03-19 DIAGNOSIS — R0600 Dyspnea, unspecified: Secondary | ICD-10-CM

## 2019-03-19 DIAGNOSIS — R0609 Other forms of dyspnea: Secondary | ICD-10-CM

## 2019-03-19 DIAGNOSIS — I420 Dilated cardiomyopathy: Secondary | ICD-10-CM

## 2019-03-19 DIAGNOSIS — I4819 Other persistent atrial fibrillation: Secondary | ICD-10-CM | POA: Insufficient documentation

## 2019-03-19 HISTORY — DX: Dyspnea, unspecified: R06.00

## 2019-03-19 HISTORY — DX: Other persistent atrial fibrillation: I48.19

## 2019-03-19 HISTORY — DX: Dilated cardiomyopathy: I42.0

## 2019-03-19 HISTORY — DX: Acute on chronic combined systolic (congestive) and diastolic (congestive) heart failure: I50.43

## 2019-03-19 HISTORY — DX: Other forms of dyspnea: R06.09

## 2019-03-19 MED ORDER — APIXABAN 5 MG PO TABS: 5.0000 mg | ORAL_TABLET | Freq: Two times a day (BID) | ORAL | 1 refills | Status: DC

## 2019-03-19 MED ORDER — FUROSEMIDE 20 MG PO TABS: 20.0000 mg | ORAL_TABLET | Freq: Every day | ORAL | 1 refills | Status: DC

## 2019-03-19 NOTE — Progress Notes (Signed)
Virtual Visit via Video Note   This visit type was conducted due to national recommendations for restrictions regarding the COVID-19 Pandemic (e.g. social distancing) in an effort to limit this patient's exposure and mitigate transmission in our community.  Due to his co-morbid illnesses, this patient is at least at moderate risk for complications without adequate follow up.  This format is felt to be most appropriate for this patient at this time.  All issues noted in this document were discussed and addressed.  A limited physical exam was performed with this format.  Please refer to the patient's chart for his consent to telehealth for [facility].  Evaluation Performed:  Follow-up visit  This visit type was conducted due to national recommendations for restrictions regarding the COVID-19 Pandemic (e.g. social distancing).  This format is felt to be most appropriate for this patient at this time.  All issues noted in this document were discussed and addressed.  No physical exam was performed (except for noted visual exam findings with Video Visits).  Please refer to the patient's chart (MyChart message for video visits and phone note for telephone visits) for the patient's consent to telehealth for [facility].  Date:  03/19/2019  ID: Curtis Ramos, DOB Aug 13, 1932, MRN [id number]   Patient Location: [address] [place] [address]   Provider location:   [place] Office  PCP:  Cyndi Bender, PA-C  Cardiologist:  Jenne Campus, MD     Chief Complaint: I am feeling better  History of Present Illness:    Curtis Ramos is a 85 y.o. male  who presents via audio/video conferencing for a telehealth visit today.  He was initially referred to [place] because of worsening of shortness of breath.  He was referred from pulmonary office where he was follow-up for some pleural effusion.  Echocardiogram was done which showed ejection fraction 20 to 25% as well as atrial fibrillation he  was referred to [place] however when we call him on the phone for screening before the visit with find out that he was quite significantly sick with severe shortness of breath proximal nocturnal dyspnea swelling of lower extremities and we recommended for him to go to the emergency room to be quickly assessed and managed.  He went to the emergency room he was found to be in congestive heart failure.  He was given 20 mg of Lasix.  EKG was done which confirmed presence of atrial fibrillation however that issue was completely not addressed.  Today I talked to him and see how he does he is feeling significantly better he complained of having shortness of breath going on for months however within last few weeks it really became a problem few days before he end up going to the emergency room he could not sleep at night I told he thought he was suffocating.  Did not have any chest pain, tightness, pressure, squeezing, burning in the chest no palpitations just difficulty breathing since you being in the emergency room he was given 20 mg of Lasix every day which he takes and he is feeling better in the matter-of-fact when he called him today to have our video visit he was not home he was working.  I do video visit with him right now and his son Curtis Ramos is available and present during our visit we actually do face time to have this conversation he is feeling significantly better and he does not perceive any problem that he have.  Denies  having any palpitation shortness of breath is gone no chest pain overall happy the way he feels.   The patient does not have symptoms concerning for COVID-19 infection (fever, chills, cough, or new SHORTNESS OF BREATH).    Prior CV studies:   The following studies were reviewed today:  Cardiogram done by pulmonologist showed ejection fraction 20 to 25%     Past Medical History:  Diagnosis Date  . Arthritis    "proabably"  . Cancer [facility])    family unaware of this hx on 12/26/2014  .  GERD (gastroesophageal reflux disease)   . Hypothyroidism   . Mycobacterium avium complex ([place])   . PONV (postoperative nausea and vomiting)   . Shingles   . Wears glasses     Past Surgical History:  Procedure Laterality Date  . APPENDECTOMY    . CATARACT EXTRACTION W/ INTRAOCULAR LENS  IMPLANT, BILATERAL Bilateral   . COLONOSCOPY    . INGUINAL HERNIA REPAIR Left   . MASS EXCISION Right 08/27/2014   Procedure: EXCISION MASS RIGHT PALM/INDEX;  Surgeon: Leanora Cover, MD;  Location: [place];  Service: Orthopedics;  Laterality: Right;  . THYROIDECTOMY  2003     Current Meds  Medication Sig  . CVS CLOTRIMAZOLE 1 % cream APPLY 1 (ONE) APPLICATION TO BOTH FEET TWICE A DAY FOR 2-4 WEEKS  . furosemide (LASIX) 20 MG tablet Take 1 tablet (20 mg total) by mouth daily.  Marland Kitchen latanoprost (XALATAN) 0.005 % ophthalmic solution   . levothyroxine (SYNTHROID, LEVOTHROID) 175 MCG tablet Take 175 mcg by mouth daily before breakfast.  . Multiple Vitamins-Minerals (ONE-A-DAY VITACRAVES IMMUNITY) CHEW Chew 2 tablets by mouth daily.  Marland Kitchen oxyCODONE-acetaminophen (PERCOCET/ROXICET) 5-325 MG per tablet Take 1 tablet by mouth every 4 (four) hours as needed for severe pain.  . pregabalin (LYRICA) 100 MG capsule Take 100 mg by mouth 2 (two) times daily.   . valACYclovir (VALTREX) 500 MG tablet Take 500 mg by mouth daily.   Marland Kitchen zinc gluconate 50 MG tablet Take 50 mg by mouth daily.      Family History: The patient's family history is not on file.   ROS:   Please see the history of present illness.     All other systems reviewed and are negative.   Labs/Other Tests and Data Reviewed:     Recent Labs: 03/15/2019: ALT 26; B Natriuretic Peptide 1,522.3; BUN 20; Creatinine, Ser 1.04; Hemoglobin 14.2; Platelets 158; Potassium 4.0; Sodium 136  Recent Lipid Panel No results found for: CHOL, TRIG, HDL, CHOLHDL, VLDL, LDLCALC, LDLDIRECT    Exam:    Vital Signs:  BP 119/84     Wt Readings  from Last 3 Encounters:  05/23/18 164 lb (74.4 kg)  04/12/18 164 lb (74.4 kg)  12/04/15 168 lb (76.2 kg)     Well nourished, well developed in no acute distress. Alert awake oriented x3.  Looks good to offer the video link.  No JVD no swelling of lower extremities  Diagnosis for this visit:   1. Dilated cardiomyopathy (HCC)   2. Persistent atrial fibrillation   3. Acute on chronic combined systolic and diastolic CHF (congestive heart failure) ([place])   4. Dyspnea on exertion      ASSESSMENT & PLAN:    1.  Dilated cardiomyopathy which is a new discovery 32 to 25% ejection fraction etiology of this phenomenon is unclear.  He is on 20 mg of Lasix with quite impressive response.  We will continue with that I  will ask him to come to office tomorrow and he will have Chem-7 done based on that we decide if we need to initiate ARB or Entresto.  He will required beta-blocker as well as Entresto and hopefully with this management will be able to improve his left ventricular ejection fraction. 2.  Persistent atrial fibrillation of unknown duration.  I will initiate anticoagulation today I reviewed laboratory test from emergency room that showed normal CBC he will come to my office tomorrow we will give him tube to give her samples of stool to check for guaiac.  I told him not to take aspirin anymore.  In the future we will talk about potentially converting him to sinus rhythm.  His rate appears to be controlled. 3.  Acute on chronic congestive heart failure.  Appears to be better but only diuretic will require more advanced therapy which include Entresto/beta-blocker which gradual I will put him on. 4.  Dyspnea on exertion improving from that point of view.  Overall gentleman with multiple issues identities completely understand the significance of the problem that he have likely his son Curtis Ramos was present during our conversation I think they understand the problem.  Will gradually put him on the right  medication he will require frequent visit initially.  In the future he will require it also to have a CAD work-up try to explain etiology of his cardiomyopathy.  COVID-19 Education: The signs and symptoms of COVID-19 were discussed with the patient and how to seek care for testing (follow up with PCP or arrange E-visit).  The importance of social distancing was discussed today.  Patient Risk:   After full review of this patients clinical status, I feel that they are at least moderate risk at this time.  Time:   Today, I have spent 27 minutes with the patient with telehealth technology discussing pt health issues.  I spent 5 minutes reviewing her chart before the visit.  Visit was finished at 10:52 AM.    Medication Adjustments/Labs and Tests Ordered: Current medicines are reviewed at length with the patient today.  Concerns regarding medicines are outlined above.  No orders of the defined types were placed in this encounter.  Medication changes: No orders of the defined types were placed in this encounter.    Disposition: Follow-up 1 week.  Tomorrow he will have Chem-7 proBNP as well as stool for guaiac.  Signed, Park Liter, MD, [facility] 03/19/2019 10:51 AM    [place]

## 2019-03-19 NOTE — Patient Instructions (Signed)
Medication Instructions:  Your physician has recommended you make the following change in your medication:   Start: eliquis 5 mg twice daily   If you need a refill on your cardiac medications before your next appointment, please call your pharmacy.   Lab work: Your physician recommends that you return for lab work in the next couple of days: bmp, bnp, and stool sample.   If you have labs (blood work) drawn today and your tests are completely normal, you will receive your results only by: Marland Kitchen MyChart Message (if you have MyChart) OR . A paper copy in the mail If you have any lab test that is abnormal or we need to change your treatment, we will call you to review the results.  Testing/Procedures: None.   Follow-Up: At [facility], you and your health needs are our priority.  As part of our continuing mission to provide you with exceptional heart care, we have created designated Provider Care Teams.  These Care Teams include your primary Cardiologist (physician) and Advanced Practice Providers (APPs -  Physician Assistants and Nurse Practitioners) who all work together to provide you with the care you need, when you need it. You will need a follow up appointment in 1 weeks    Apixaban oral tablets What is this medicine? APIXABAN (a PIX a ban) is an anticoagulant (blood thinner). It is used to lower the chance of stroke in people with a medical condition called atrial fibrillation. It is also used to treat or prevent blood clots in the lungs or in the veins. This medicine may be used for other purposes; ask your health care provider or pharmacist if you have questions. COMMON BRAND NAME(S): Eliquis What should I tell my health care provider before I take this medicine? They need to know if you have any of these conditions: -bleeding disorders -bleeding in the brain -blood in your stools (black or tarry stools) or if you have blood in your vomit -history of stomach bleeding -kidney  disease -liver disease -mechanical heart valve -an unusual or allergic reaction to apixaban, other medicines, foods, dyes, or preservatives -pregnant or trying to get pregnant -breast-feeding How should I use this medicine? Take this medicine by mouth with a glass of water. Follow the directions on the prescription label. You can take it with or without food. If it upsets your stomach, take it with food. Take your medicine at regular intervals. Do not take it more often than directed. Do not stop taking except on your doctor's advice. Stopping this medicine may increase your risk of a blood clot. Be sure to refill your prescription before you run out of medicine. Talk to your pediatrician regarding the use of this medicine in children. Special care may be needed. Overdosage: If you think you have taken too much of this medicine contact a poison control center or emergency room at once. NOTE: This medicine is only for you. Do not share this medicine with others. What if I miss a dose? If you miss a dose, take it as soon as you can. If it is almost time for your next dose, take only that dose. Do not take double or extra doses. What may interact with this medicine? This medicine may interact with the following: -aspirin and aspirin-like medicines -certain medicines for fungal infections like ketoconazole and itraconazole -certain medicines for seizures like carbamazepine and phenytoin -certain medicines that treat or prevent blood clots like warfarin, enoxaparin, and dalteparin -clarithromycin -NSAIDs, medicines for pain and inflammation,  like ibuprofen or naproxen -rifampin -ritonavir -St. John's wort This list may not describe all possible interactions. Give your health care provider a list of all the medicines, herbs, non-prescription drugs, or dietary supplements you use. Also tell them if you smoke, drink alcohol, or use illegal drugs. Some items may interact with your medicine. What  should I watch for while using this medicine? Visit your healthcare professional for regular checks on your progress. You may need blood work done while you are taking this medicine. Your condition will be monitored carefully while you are receiving this medicine. It is important not to miss any appointments. Avoid sports and activities that might cause injury while you are using this medicine. Severe falls or injuries can cause unseen bleeding. Be careful when using sharp tools or knives. Consider using an Copy. Take special care brushing or flossing your teeth. Report any injuries, bruising, or red spots on the skin to your healthcare professional. If you are going to need surgery or other procedure, tell your healthcare professional that you are taking this medicine. Wear a medical ID bracelet or chain. Carry a card that describes your disease and details of your medicine and dosage times. What side effects may I notice from receiving this medicine? Side effects that you should report to your doctor or health care professional as soon as possible: -allergic reactions like skin rash, itching or hives, swelling of the face, lips, or tongue -signs and symptoms of bleeding such as bloody or black, tarry stools; red or dark-brown urine; spitting up blood or brown material that looks like coffee grounds; red spots on the skin; unusual bruising or bleeding from the eye, gums, or nose -signs and symptoms of a blood clot such as chest pain; shortness of breath; pain, swelling, or warmth in the leg -signs and symptoms of a stroke such as changes in vision; confusion; trouble speaking or understanding; severe headaches; sudden numbness or weakness of the face, arm or leg; trouble walking; dizziness; loss of coordination This list may not describe all possible side effects. Call your doctor for medical advice about side effects. You may report side effects to FDA at 1-800-FDA-1088. Where should I keep  my medicine? Keep out of the reach of children. Store at room temperature between 20 and 25 degrees C (68 and 77 degrees F). Throw away any unused medicine after the expiration date. NOTE: This sheet is a summary. It may not cover all possible information. If you have questions about this medicine, talk to your doctor, pharmacist, or health care provider.  2019 Elsevier/Gold Standard (2017-10-27 11:20:07)

## 2019-03-20 ENCOUNTER — Telehealth: Payer: Medicare Other | Admitting: Cardiology

## 2019-03-20 LAB — BASIC METABOLIC PANEL
BUN/Creatinine Ratio: 17 (ref 10–24)
BUN: 22 mg/dL (ref 8–27)
CO2: 21 mmol/L (ref 20–29)
Calcium: 10.4 mg/dL — ABNORMAL HIGH (ref 8.6–10.2)
Chloride: 104 mmol/L (ref 96–106)
Creatinine, Ser: 1.29 mg/dL — ABNORMAL HIGH (ref 0.76–1.27)
GFR calc Af Amer: 58 mL/min/{1.73_m2} — ABNORMAL LOW (ref >59)
GFR calc non Af Amer: 51 mL/min/{1.73_m2} — ABNORMAL LOW (ref >59)
Glucose: 105 mg/dL — ABNORMAL HIGH (ref 65–99)
Potassium: 4.2 mmol/L (ref 3.5–5.2)
Sodium: 140 mmol/L (ref 134–144)

## 2019-03-20 LAB — PRO B NATRIURETIC PEPTIDE: NT-Pro BNP: 4304 pg/mL — ABNORMAL HIGH (ref 0–486)

## 2019-03-27 ENCOUNTER — Telehealth (INDEPENDENT_AMBULATORY_CARE_PROVIDER_SITE_OTHER): Payer: Medicare Other | Admitting: Cardiology

## 2019-03-27 ENCOUNTER — Encounter: Payer: Self-pay | Admitting: Cardiology

## 2019-03-27 ENCOUNTER — Other Ambulatory Visit: Payer: Self-pay

## 2019-03-27 VITALS — BP 131/83 | HR 89 | Wt 149.0 lb

## 2019-03-27 DIAGNOSIS — I420 Dilated cardiomyopathy: Secondary | ICD-10-CM | POA: Diagnosis not present

## 2019-03-27 DIAGNOSIS — H35373 Puckering of macula, bilateral: Secondary | ICD-10-CM | POA: Diagnosis not present

## 2019-03-27 DIAGNOSIS — H353132 Nonexudative age-related macular degeneration, bilateral, intermediate dry stage: Secondary | ICD-10-CM | POA: Diagnosis not present

## 2019-03-27 DIAGNOSIS — H401131 Primary open-angle glaucoma, bilateral, mild stage: Secondary | ICD-10-CM | POA: Diagnosis not present

## 2019-03-27 DIAGNOSIS — H35343 Macular cyst, hole, or pseudohole, bilateral: Secondary | ICD-10-CM | POA: Diagnosis not present

## 2019-03-27 DIAGNOSIS — I4819 Other persistent atrial fibrillation: Secondary | ICD-10-CM

## 2019-03-27 DIAGNOSIS — R0609 Other forms of dyspnea: Secondary | ICD-10-CM

## 2019-03-27 NOTE — Progress Notes (Signed)
Virtual Visit via Video Note   This visit type was conducted due to national recommendations for restrictions regarding the COVID-19 Pandemic (e.g. social distancing) in an effort to limit this patient's exposure and mitigate transmission in our community.  Due to his co-morbid illnesses, this patient is at least at moderate risk for complications without adequate follow up.  This format is felt to be most appropriate for this patient at this time.  All issues noted in this document were discussed and addressed.  A limited physical exam was performed with this format.  Please refer to the patient's chart for his consent to telehealth for [facility].  Evaluation Performed:  Follow-up visit  This visit type was conducted due to national recommendations for restrictions regarding the COVID-19 Pandemic (e.g. social distancing).  This format is felt to be most appropriate for this patient at this time.  All issues noted in this document were discussed and addressed.  No physical exam was performed (except for noted visual exam findings with Video Visits).  Please refer to the patient's chart (MyChart message for video visits and phone note for telephone visits) for the patient's consent to telehealth for [facility].  Date:  03/27/2019  ID: [name], DOB February 21, 1932, MRN [id number]   Patient Location: [address] [place] [address]   Provider location:   [place] Office  PCP:  Cyndi Bender, PA-C  Cardiologist:  Jenne Campus, MD     Chief Complaint: Doing much better  History of Present Illness:    Curtis Ramos is a 85 y.o. male  who presents via audio/video conferencing for a telehealth visit today.  With cardiomyopathy ejection fraction 2025% also recently recognized most likely permanent atrial fibrillation.  He been managed very carefully with small dose of diuretic with quite impressive response.  I also gave him anticoagulation.  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  Just a moment ago he carried big concern for [facility] with no difficulties he feels that he is doing better at the same time he described episode yesterday at night that he was short of breath and have to sit.  I think overall that he is downgrading his symptoms and he is far from being well.  Therefore, I decided to bring him to the office will do regular physical visit next week   The patient does not have symptoms concerning for COVID-19 infection (fever, chills, cough, or new SHORTNESS OF BREATH).    Prior CV studies:   The following studies were reviewed today:  Echocardiogram done by primary care physician showing ejection fraction 20 to 25%     Past Medical History:  Diagnosis Date  . Arthritis    "proabably"  . Cancer [facility])    family unaware of this hx on 12/26/2014  . GERD (gastroesophageal reflux disease)   . Hypothyroidism   . Mycobacterium avium complex ([place])   . PONV (postoperative nausea and vomiting)   . Shingles   . Wears glasses     Past Surgical History:  Procedure Laterality Date  . APPENDECTOMY    . CATARACT EXTRACTION W/ INTRAOCULAR LENS  IMPLANT, BILATERAL Bilateral   . COLONOSCOPY    . INGUINAL HERNIA REPAIR Left   . MASS EXCISION Right 08/27/2014   Procedure: EXCISION MASS RIGHT PALM/INDEX;  Surgeon: Leanora Cover, MD;  Location: [place];  Service: Orthopedics;  Laterality:  Right;  Marland Kitchen THYROIDECTOMY  2003     Current Meds  Medication Sig  . apixaban (ELIQUIS) 5 MG TABS tablet Take 1 tablet (5 mg total) by mouth 2 (two) times daily.  . CVS CLOTRIMAZOLE 1 % cream APPLY 1 (ONE) APPLICATION TO BOTH FEET TWICE A DAY FOR 2-4 WEEKS  . furosemide (LASIX) 20 MG tablet Take 1 tablet (20 mg total) by mouth daily.  Marland Kitchen latanoprost (XALATAN) 0.005 % ophthalmic solution   . levothyroxine (SYNTHROID, LEVOTHROID) 175 MCG tablet Take 175 mcg by mouth daily before breakfast.   . Multiple Vitamins-Minerals (ONE-A-DAY VITACRAVES IMMUNITY) CHEW Chew 2 tablets by mouth daily.  Marland Kitchen oxyCODONE-acetaminophen (PERCOCET/ROXICET) 5-325 MG per tablet Take 1 tablet by mouth every 4 (four) hours as needed for severe pain.  . pregabalin (LYRICA) 100 MG capsule Take 100 mg by mouth 2 (two) times daily.   . valACYclovir (VALTREX) 500 MG tablet Take 500 mg by mouth daily.   Marland Kitchen zinc gluconate 50 MG tablet Take 50 mg by mouth daily.      Family History: The patient's family history is not on file.   ROS:   Please see the history of present illness.     All other systems reviewed and are negative.   Labs/Other Tests and Data Reviewed:     Recent Labs: 03/15/2019: ALT 26; B Natriuretic Peptide 1,522.3; Hemoglobin 14.2; Platelets 158 03/19/2019: BUN 22; Creatinine, Ser 1.29; NT-Pro BNP 4,304; Potassium 4.2; Sodium 140  Recent Lipid Panel No results found for: CHOL, TRIG, HDL, CHOLHDL, VLDL, LDLCALC, LDLDIRECT    Exam:    Vital Signs:  BP 131/83   Pulse 89   Wt 149 lb (67.6 kg)   BMI 20.78 kg/m     Wt Readings from Last 3 Encounters:  03/27/19 149 lb (67.6 kg)  05/23/18 164 lb (74.4 kg)  04/12/18 164 lb (74.4 kg)     Well nourished, well developed in no acute distress. Alert awake oriented x3 happy to be able to talk to me.  Not in any distress.  Doing well no JVD no swelling of lower extremities  Diagnosis for this visit:   1. Dilated cardiomyopathy (HCC)   2. Persistent atrial fibrillation   3. Dyspnea on exertion      ASSESSMENT & PLAN:    1.  Dilated cardiomyopathy given only furosemide so far.  Last Chem-7 showed increase in creatinine.  I bring him to the office next week and initiate appropriate therapy for cardiomyopathy.  He will required beta-blocker however I doubt he will be able to tolerate ARB or ACE inhibitor because of kidney dysfunction.  Overall clinically he improved quite significantly. 2.  Persistent atrial fibrillation, anticoagulated  will continue we will talk about converting to sinus rhythm in about 3 to 4 weeks. 3.  Dyspnea on exertion dramatically improved.  He thinks he is fine and he does not think he got any problem.  COVID-19 Education: The signs and symptoms of COVID-19 were discussed with the patient and how to seek care for testing (follow up with PCP or arrange E-visit).  The importance of social distancing was discussed today.  Patient Risk:   After full review of this patients clinical status, I feel that they are at least moderate risk at this time.  Time:   Today, I have spent 15 minutes with the patient with telehealth technology discussing pt health issues.  I spent 5 minutes reviewing her chart before the visit.  Visit was finished at 10:38  AM.    Medication Adjustments/Labs and Tests Ordered: Current medicines are reviewed at length with the patient today.  Concerns regarding medicines are outlined above.  No orders of the defined types were placed in this encounter.  Medication changes: No orders of the defined types were placed in this encounter.    Disposition: Follow-up in 1 week in my office  Signed, Park Liter, MD, [facility] 03/27/2019 10:39 AM    [place]

## 2019-03-27 NOTE — Patient Instructions (Signed)
Medication Instructions:  Your physician recommends that you continue on your current medications as directed. Please refer to the Current Medication list given to you today.  If you need a refill on your cardiac medications before your next appointment, please call your pharmacy.   Lab work: None  If you have labs (blood work) drawn today and your tests are completely normal, you will receive your results only by: Marland Kitchen MyChart Message (if you have MyChart) OR . A paper copy in the mail If you have any lab test that is abnormal or we need to change your treatment, we will call you to review the results.  Testing/Procedures: None.   Follow-Up: At [facility], you and your health needs are our priority.  As part of our continuing mission to provide you with exceptional heart care, we have created designated Provider Care Teams.  These Care Teams include your primary Cardiologist (physician) and Advanced Practice Providers (APPs -  Physician Assistants and Nurse Practitioners) who all work together to provide you with the care you need, when you need it. You will need a follow up appointment in 1 weeks.  Please call our office 2 months in advance to schedule this appointment.  You may see No primary care provider on file. or another member of our [employer] Provider Team in [place]: Shirlee More, MD . Jyl Heinz, MD  Any Other Special Instructions Will Be Listed Below (If Applicable).    2

## 2019-04-05 ENCOUNTER — Ambulatory Visit (INDEPENDENT_AMBULATORY_CARE_PROVIDER_SITE_OTHER): Payer: Medicare Other | Admitting: Cardiology

## 2019-04-05 ENCOUNTER — Encounter: Payer: Self-pay | Admitting: Cardiology

## 2019-04-05 ENCOUNTER — Other Ambulatory Visit: Payer: Self-pay

## 2019-04-05 VITALS — BP 120/64 | HR 80 | Wt 149.0 lb

## 2019-04-05 DIAGNOSIS — I5043 Acute on chronic combined systolic (congestive) and diastolic (congestive) heart failure: Secondary | ICD-10-CM | POA: Diagnosis not present

## 2019-04-05 DIAGNOSIS — I420 Dilated cardiomyopathy: Secondary | ICD-10-CM

## 2019-04-05 DIAGNOSIS — I4819 Other persistent atrial fibrillation: Secondary | ICD-10-CM | POA: Diagnosis not present

## 2019-04-05 NOTE — Patient Instructions (Signed)
Medication Instructions:  Your physician recommends that you continue on your current medications as directed. Please refer to the Current Medication list given to you today.  If you need a refill on your cardiac medications before your next appointment, please call your pharmacy.   Lab work: Your physician recommends that you return for lab work today: bmp   If you have labs (blood work) drawn today and your tests are completely normal, you will receive your results only by: Marland Kitchen MyChart Message (if you have MyChart) OR . A paper copy in the mail If you have any lab test that is abnormal or we need to change your treatment, we will call you to review the results.  Testing/Procedures: None.   Follow-Up: At [facility], you and your health needs are our priority.  As part of our continuing mission to provide you with exceptional heart care, we have created designated Provider Care Teams.  These Care Teams include your primary Cardiologist (physician) and Advanced Practice Providers (APPs -  Physician Assistants and Nurse Practitioners) who all work together to provide you with the care you need, when you need it. You will need a follow up appointment in 3 weeks.  Please call our office 2 months in advance to schedule this appointment.  You may see No primary care provider on file. or another member of our [employer] Provider Team in [place]: Shirlee More, MD . Jyl Heinz, MD  Any Other Special Instructions Will Be Listed Below (If Applicable).

## 2019-04-05 NOTE — Progress Notes (Signed)
Cardiology Office Note:    Date:  04/05/2019   ID:  Curtis Ramos, DOB 05-23-1932, MRN [id number]  PCP:  Cyndi Bender, PA-C  Cardiologist:  Jenne Campus, MD    Referring MD: Cyndi Bender, PA-C   No chief complaint on file. Doing well, still some sob  History of Present Illness:    Curtis Ramos is a 85 y.o. male  With severe cardiomyopathy, atrial fibrillatipn, cmes to the office for f/up. Overall doing better, less sob, less PND. NO swelling, no syncope.  Past Medical History:  Diagnosis Date  . Arthritis    "proabably"  . Cancer [facility])    family unaware of this hx on 12/26/2014  . GERD (gastroesophageal reflux disease)   . Hypothyroidism   . Mycobacterium avium complex ([place])   . PONV (postoperative nausea and vomiting)   . Shingles   . Wears glasses     Past Surgical History:  Procedure Laterality Date  . APPENDECTOMY    . CATARACT EXTRACTION W/ INTRAOCULAR LENS  IMPLANT, BILATERAL Bilateral   . COLONOSCOPY    . INGUINAL HERNIA REPAIR Left   . MASS EXCISION Right 08/27/2014   Procedure: EXCISION MASS RIGHT PALM/INDEX;  Surgeon: Leanora Cover, MD;  Location: [place];  Service: Orthopedics;  Laterality: Right;  . THYROIDECTOMY  2003    Current Medications: Current Meds  Medication Sig  . apixaban (ELIQUIS) 5 MG TABS tablet Take 1 tablet (5 mg total) by mouth 2 (two) times daily.  . CVS CLOTRIMAZOLE 1 % cream APPLY 1 (ONE) APPLICATION TO BOTH FEET TWICE A DAY FOR 2-4 WEEKS  . furosemide (LASIX) 20 MG tablet Take 1 tablet (20 mg total) by mouth daily.  Marland Kitchen latanoprost (XALATAN) 0.005 % ophthalmic solution   . levothyroxine (SYNTHROID, LEVOTHROID) 175 MCG tablet Take 175 mcg by mouth daily before breakfast.  . Multiple Vitamins-Minerals (ONE-A-DAY VITACRAVES IMMUNITY) CHEW Chew 2 tablets by mouth daily.  Marland Kitchen oxyCODONE-acetaminophen (PERCOCET/ROXICET) 5-325 MG per tablet Take 1 tablet by mouth every 4 (four) hours as needed for severe pain.  .  pregabalin (LYRICA) 100 MG capsule Take 100 mg by mouth 2 (two) times daily.   . valACYclovir (VALTREX) 500 MG tablet Take 500 mg by mouth daily.   Marland Kitchen zinc gluconate 50 MG tablet Take 50 mg by mouth daily.     Allergies:   Patient has no known allergies.   Social History   Socioeconomic History  . Marital status: Married    Spouse name: Not on file  . Number of children: Not on file  . Years of education: Not on file  . Highest education level: Not on file  Occupational History  . Not on file  Social Needs  . Financial resource strain: Not on file  . Food insecurity:    Worry: Not on file    Inability: Not on file  . Transportation needs:    Medical: Not on file    Non-medical: Not on file  Tobacco Use  . Smoking status: Never Smoker  . Smokeless tobacco: Never Used  Substance and Sexual Activity  . Alcohol use: No  . Drug use: No  . Sexual activity: Not on file  Lifestyle  . Physical activity:    Days per week: Not on file    Minutes per session: Not on file  . Stress: Not on file  Relationships  . Social connections:    Talks on phone: Not on file    Gets together: Not  on file    Attends religious service: Not on file    Active member of club or organization: Not on file    Attends meetings of clubs or organizations: Not on file    Relationship status: Not on file  Other Topics Concern  . Not on file  Social History Narrative  . Not on file     Family History: The patient's family history is not on file. ROS:   Please see the history of present illness.    All 14 point review of systems negative except as described per history of present illness  EKGs/Labs/Other Studies Reviewed:      Recent Labs: 03/15/2019: ALT 26; B Natriuretic Peptide 1,522.3; Hemoglobin 14.2; Platelets 158 03/19/2019: BUN 22; Creatinine, Ser 1.29; NT-Pro BNP 4,304; Potassium 4.2; Sodium 140  Recent Lipid Panel No results found for: CHOL, TRIG, HDL, CHOLHDL, VLDL, LDLCALC, LDLDIRECT   Physical Exam:    VS:  BP 120/64   Pulse 80   Wt 149 lb (67.6 kg)   BMI 20.78 kg/m     Wt Readings from Last 3 Encounters:  04/05/19 149 lb (67.6 kg)  03/27/19 149 lb (67.6 kg)  05/23/18 164 lb (74.4 kg)     GEN:  Well nourished, well developed in no acute distress HEENT: Normal NECK: No JVD; No carotid bruits LYMPHATICS: No lymphadenopathy CARDIAC: irreg itteg, no murmurs, no rubs, no gallops RESPIRATORY:  Clear to auscultation without rales, wheezing or rhonchi  ABDOMEN: Soft, non-tender, non-distended MUSCULOSKELETAL:  No edema; No deformity  SKIN: Warm and dry LOWER EXTREMITIES: no swelling NEUROLOGIC:  Alert and oriented x 3 PSYCHIATRIC:  Normal affect   ASSESSMENT:    1. Dilated cardiomyopathy (HCC)   2. Persistent atrial fibrillation   3. Acute on chronic combined systolic and diastolic CHF (congestive heart failure) ([place])    PLAN:    In order of problems listed above:  1. Stable, but needs improvement to his meds. Will get chem7 if ok start with ARB, if no hydralazine 2. Persistent Atrial fibrillation - rate controlled, anticoagulated, started conversing about CV, will consider if few weeks. 3. CHF - NYHA2/3 plan asa above 4.    Medication Adjustments/Labs and Tests Ordered: Current medicines are reviewed at length with the patient today.  Concerns regarding medicines are outlined above.  Orders Placed This Encounter  Procedures  . Basic metabolic panel   Medication changes: No orders of the defined types were placed in this encounter.   Signed, Park Liter, MD, [facility] 04/05/2019 2:42 PM    [place]

## 2019-04-06 ENCOUNTER — Telehealth: Payer: Self-pay | Admitting: Emergency Medicine

## 2019-04-06 DIAGNOSIS — I5043 Acute on chronic combined systolic (congestive) and diastolic (congestive) heart failure: Secondary | ICD-10-CM

## 2019-04-06 DIAGNOSIS — I4819 Other persistent atrial fibrillation: Secondary | ICD-10-CM

## 2019-04-06 DIAGNOSIS — I420 Dilated cardiomyopathy: Secondary | ICD-10-CM

## 2019-04-06 LAB — BASIC METABOLIC PANEL
BUN/Creatinine Ratio: 21 (ref 10–24)
BUN: 26 mg/dL (ref 8–27)
CO2: 22 mmol/L (ref 20–29)
Calcium: 9.7 mg/dL (ref 8.6–10.2)
Chloride: 102 mmol/L (ref 96–106)
Creatinine, Ser: 1.23 mg/dL (ref 0.76–1.27)
GFR calc Af Amer: 62 mL/min/{1.73_m2} (ref >59)
GFR calc non Af Amer: 54 mL/min/{1.73_m2} — ABNORMAL LOW (ref >59)
Glucose: 95 mg/dL (ref 65–99)
Potassium: 4.4 mmol/L (ref 3.5–5.2)
Sodium: 139 mmol/L (ref 134–144)

## 2019-04-06 MED ORDER — LOSARTAN POTASSIUM 25 MG PO TABS: 25.0000 mg | ORAL_TABLET | Freq: Every day | ORAL | 1 refills | Status: DC

## 2019-04-06 NOTE — Telephone Encounter (Signed)
Patient informed of lab results and advised to start losartan 25 mg daily and to have labs redrawn in 1 week. Patient verbally understands.

## 2019-04-16 DIAGNOSIS — I420 Dilated cardiomyopathy: Secondary | ICD-10-CM | POA: Diagnosis not present

## 2019-04-16 DIAGNOSIS — I5043 Acute on chronic combined systolic (congestive) and diastolic (congestive) heart failure: Secondary | ICD-10-CM | POA: Diagnosis not present

## 2019-04-16 DIAGNOSIS — I4819 Other persistent atrial fibrillation: Secondary | ICD-10-CM | POA: Diagnosis not present

## 2019-04-17 LAB — BASIC METABOLIC PANEL
BUN/Creatinine Ratio: 21 (ref 10–24)
BUN: 27 mg/dL (ref 8–27)
CO2: 23 mmol/L (ref 20–29)
Calcium: 10.1 mg/dL (ref 8.6–10.2)
Chloride: 104 mmol/L (ref 96–106)
Creatinine, Ser: 1.31 mg/dL — ABNORMAL HIGH (ref 0.76–1.27)
GFR calc Af Amer: 57 mL/min/{1.73_m2} — ABNORMAL LOW (ref >59)
GFR calc non Af Amer: 50 mL/min/{1.73_m2} — ABNORMAL LOW (ref >59)
Glucose: 100 mg/dL — ABNORMAL HIGH (ref 65–99)
Potassium: 4.5 mmol/L (ref 3.5–5.2)
Sodium: 140 mmol/L (ref 134–144)

## 2019-04-18 ENCOUNTER — Telehealth: Payer: Self-pay | Admitting: Emergency Medicine

## 2019-04-18 NOTE — Telephone Encounter (Signed)
Left message for patient to return call regarding lab results.  

## 2019-04-18 NOTE — Telephone Encounter (Signed)
Informed pt labs are looking good per Dr.K.

## 2019-04-30 ENCOUNTER — Ambulatory Visit: Payer: Medicare Other | Admitting: Cardiology

## 2019-08-13 ENCOUNTER — Ambulatory Visit (INDEPENDENT_AMBULATORY_CARE_PROVIDER_SITE_OTHER): Payer: Medicare Other | Admitting: Podiatry

## 2019-08-13 ENCOUNTER — Other Ambulatory Visit: Payer: Self-pay

## 2019-08-13 DIAGNOSIS — L97511 Non-pressure chronic ulcer of other part of right foot limited to breakdown of skin: Secondary | ICD-10-CM | POA: Diagnosis not present

## 2019-08-13 DIAGNOSIS — M2041 Other hammer toe(s) (acquired), right foot: Secondary | ICD-10-CM

## 2019-08-13 NOTE — Progress Notes (Signed)
Subjective:  Patient ID: Curtis Ramos, male    DOB: September 29, 1932,  MRN: [id number]  Chief Complaint  Patient presents with  . foot lesion    Rt 4th toe lesion at tip x 1 yr; 10/10 shapr pains -pt deneis redness/swelling/drainage Tx: OTC ointment -worse w/ standing   85 y.o. male presents with the above complaint.  States that the fourth toe is starting to hurt a lot has a lesion at the tip of the toe.  History above confirmed patient  Review of Systems: Negative except as noted in the HPI. Denies N/V/F/Ch.  Past Medical History:  Diagnosis Date  . Arthritis    "proabably"  . Cancer [facility])    family unaware of this hx on 12/26/2014  . GERD (gastroesophageal reflux disease)   . Hypothyroidism   . Mycobacterium avium complex ([place])   . PONV (postoperative nausea and vomiting)   . Shingles   . Wears glasses     Current Outpatient Medications:  .  apixaban (ELIQUIS) 5 MG TABS tablet, Take 1 tablet (5 mg total) by mouth 2 (two) times daily., Disp: 180 tablet, Rfl: 1 .  [employer] CLOTRIMAZOLE 1 % cream, APPLY 1 (ONE) APPLICATION TO BOTH FEET TWICE A DAY FOR 2-4 WEEKS, Disp: , Rfl: 0 .  furosemide (LASIX) 20 MG tablet, Take 1 tablet (20 mg total) by mouth daily., Disp: 90 tablet, Rfl: 1 .  latanoprost (XALATAN) 0.005 % ophthalmic solution, , Disp: , Rfl:  .  levothyroxine (SYNTHROID, LEVOTHROID) 175 MCG tablet, Take 175 mcg by mouth daily before breakfast., Disp: , Rfl:  .  losartan (COZAAR) 25 MG tablet, Take 1 tablet (25 mg total) by mouth daily., Disp: 90 tablet, Rfl: 1 .  Multiple Vitamins-Minerals (ONE-A-DAY VITACRAVES IMMUNITY) CHEW, Chew 2 tablets by mouth daily., Disp: , Rfl:  .  oxyCODONE-acetaminophen (PERCOCET/ROXICET) 5-325 MG per tablet, Take 1 tablet by mouth every 4 (four) hours as needed for severe pain., Disp: , Rfl:  .  pregabalin (LYRICA) 100 MG capsule, Take 100 mg by mouth 2 (two) times daily. , Disp: , Rfl:  .  valACYclovir (VALTREX) 500 MG tablet, Take 500 mg by mouth daily.  , Disp: , Rfl:  .  zinc gluconate 50 MG tablet, Take 50 mg by mouth daily., Disp: , Rfl:   Social History   Tobacco Use  Smoking Status Never Smoker  Smokeless Tobacco Never Used    No Known Allergies Objective:  There were no vitals filed for this visit. There is no height or weight on file to calculate BMI. Constitutional Well developed. Well nourished.  Vascular Dorsalis pedis pulses palpable bilaterally. Posterior tibial pulses palpable bilaterally. Capillary refill normal to all digits.  No cyanosis or clubbing noted. Pedal hair growth normal.  Neurologic Normal speech. Oriented to person, place, and time. Epicritic sensation to light touch grossly present bilaterally.  Dermatologic Ulceration to the distal tip of the right fourth toe with overlying callus prior to debridement measuring 0.2 x 0.2 post debridement no warmth no erythema no signs of acute infection  Orthopedic: Semi-reducible contracture to the right fourth toe.  Right third toe rectus   Radiographs: Taken and reviewed no underlying fracture or dislocation Assessment:   1. Hammer toe of right foot   2. Porokeratosis    Plan:  Patient was evaluated and treated and all questions answered.  Hammertoe right fourth toe with small ulceration -Ulceration debrided as below -Would benefit from flexor tenotomy to alleviate contracture and heel ulceration.  We  will plan for procedure either next week or the week after. -Ulceration dressed with Silvadene and Band-Aid.   Procedure: Selective Debridement of Wound Rationale: Removal of devitalized tissue from the wound to promote healing.  Pre-Debridement Wound Measurements: 0.2 cm x 0.2 cm x 0.1 cm  Post-Debridement Wound Measurements: same as pre-debridement. Type of Debridement: sharp selective Tissue Removed: Devitalized soft-tissue Dressing: Dry, sterile, compression dressing. Disposition: Patient tolerated procedure well. Patient to return in 1 week for  follow-up.    No follow-ups on file.

## 2019-08-27 ENCOUNTER — Ambulatory Visit: Payer: Medicare Other | Admitting: Podiatry

## 2019-08-27 ENCOUNTER — Other Ambulatory Visit: Payer: Self-pay

## 2019-08-27 DIAGNOSIS — B351 Tinea unguium: Secondary | ICD-10-CM

## 2019-08-27 DIAGNOSIS — M79609 Pain in unspecified limb: Secondary | ICD-10-CM

## 2019-08-27 DIAGNOSIS — M2041 Other hammer toe(s) (acquired), right foot: Secondary | ICD-10-CM

## 2019-08-27 NOTE — Progress Notes (Signed)
  Subjective:  Patient ID: Curtis Ramos, male    DOB: August 19, 1932,  MRN: [id number]  Chief Complaint  Patient presents with  . Hammer Toe    F/U Rt 4th toe HT and F/U for flexor tenotomy   85 y.o. male returns today for planned flexor tenotomy of the right 4th digit.  Also complains of thickened painful toenails to both great toenails he cannot cut.  Objective:  There were no vitals filed for this visit.  General AA&O x3. Normal mood and affect.  Vascular Pedal pulses palpable.  Neurologic Epicritic sensation grossly intact.  Dermatologic Pre-ulcerative callus at the tip of the right, 4th toe Bilateral hallux nails thickened dystrophic pain to palpation  Orthopedic: Semi-reducible hammertoe deformity right, 4th toe    Assessment & Plan:  Patient was evaluated and treated and all questions answered.  Hammertoe right 4th toe with pre-ulcerative callus -Flexor tenotomy as below. -Advised to remove the dressing in 24 hours and apply a band-aid and triple abx ointment every day thereafter. -Dispense surgical shoe  Procedure: Flexor Tenotomy Indication for Procedure: toe with semi-reducible hammertoe with distal tip ulceration. Flexor tenotomy indicated to alleviate contracture, reduce pressure, and enhance healing of the ulceration. Location: right, 4th toe Anesthesia: Lidocaine 1% plain; 1.5 mL and Marcaine 0.5% plain; 1.5 mL digital block Instrumentation: 18 g needle  Technique: The toe was anesthetized as above and prepped in the usual fashion. The toe was exsanquinated and a tourniquet was secured at the base of the toe. An 18g needle was then used to percutaneously release the flexor tendon at the plantar surface of the toe with noted release of the hammertoe deformity. The incision was then dressed with antibiotic ointment and band-aid. Compression splint dressing applied. Patient tolerated the procedure well. Dressing: Dry, sterile, compression dressing. Disposition: Patient  tolerated procedure well. Patient to return in 1 week for follow-up.  Onychomycosis with pain -Debrided x2 d/t pain  Procedure: Nail Debridement Rationale:  pain Type of Debridement: manual, sharp debridement. Instrumentation: Nail nipper, rotary burr. Number of Nails: 2  Return in about 2 weeks (around 09/10/2019) for Post-op.

## 2019-08-31 DIAGNOSIS — Z23 Encounter for immunization: Secondary | ICD-10-CM | POA: Diagnosis not present

## 2019-09-03 ENCOUNTER — Other Ambulatory Visit: Payer: Self-pay | Admitting: Cardiology

## 2019-09-10 ENCOUNTER — Ambulatory Visit (INDEPENDENT_AMBULATORY_CARE_PROVIDER_SITE_OTHER): Payer: Self-pay | Admitting: Podiatry

## 2019-09-10 ENCOUNTER — Other Ambulatory Visit: Payer: Self-pay

## 2019-09-10 DIAGNOSIS — M2041 Other hammer toe(s) (acquired), right foot: Secondary | ICD-10-CM

## 2019-09-10 NOTE — Progress Notes (Signed)
  Subjective:  Patient ID: Curtis Ramos, male    DOB: 31-Jan-1932,  MRN: [id number]  Chief Complaint  Patient presents with  . Routine Post Op    POV#1 Pt. states," doing great, I can walk again w/o any pain/limping." Tx: none -pt denies pain/redness/swellign     DOS: 08/27/2019 Procedure: Right 4th toe flexor tenotomy   85 y.o. male returns for post-op check. Doing well states the pain is better  Review of Systems: Negative except as noted in the HPI. Denies N/V/F/Ch.  Past Medical History:  Diagnosis Date  . Arthritis    "proabably"  . Cancer [facility])    family unaware of this hx on 12/26/2014  . GERD (gastroesophageal reflux disease)   . Hypothyroidism   . Mycobacterium avium complex ([place])   . PONV (postoperative nausea and vomiting)   . Shingles   . Wears glasses     Current Outpatient Medications:  .  CVS CLOTRIMAZOLE 1 % cream, APPLY 1 (ONE) APPLICATION TO BOTH FEET TWICE A DAY FOR 2-4 WEEKS, Disp: , Rfl: 0 .  ELIQUIS 5 MG TABS tablet, TAKE 1 TABLET BY MOUTH TWICE A DAY, Disp: 60 tablet, Rfl: 2 .  furosemide (LASIX) 20 MG tablet, Take 1 tablet (20 mg total) by mouth daily., Disp: 90 tablet, Rfl: 1 .  latanoprost (XALATAN) 0.005 % ophthalmic solution, , Disp: , Rfl:  .  levothyroxine (SYNTHROID, LEVOTHROID) 175 MCG tablet, Take 175 mcg by mouth daily before breakfast., Disp: , Rfl:  .  losartan (COZAAR) 25 MG tablet, Take 1 tablet (25 mg total) by mouth daily., Disp: 90 tablet, Rfl: 1 .  Multiple Vitamins-Minerals (ONE-A-DAY VITACRAVES IMMUNITY) CHEW, Chew 2 tablets by mouth daily., Disp: , Rfl:  .  oxyCODONE-acetaminophen (PERCOCET/ROXICET) 5-325 MG per tablet, Take 1 tablet by mouth every 4 (four) hours as needed for severe pain., Disp: , Rfl:  .  pregabalin (LYRICA) 100 MG capsule, Take 100 mg by mouth 2 (two) times daily. , Disp: , Rfl:  .  valACYclovir (VALTREX) 500 MG tablet, Take 500 mg by mouth daily. , Disp: , Rfl:  .  zinc gluconate 50 MG tablet, Take 50 mg by  mouth daily., Disp: , Rfl:   Social History   Tobacco Use  Smoking Status Never Smoker  Smokeless Tobacco Never Used    No Known Allergies Objective:  There were no vitals filed for this visit. There is no height or weight on file to calculate BMI. Constitutional Well developed. Well nourished.  Vascular Foot warm and well perfused. Capillary refill normal to all digits.   Neurologic Normal speech. Oriented to person, place, and time. Epicritic sensation to light touch grossly present bilaterally.  Dermatologic Skin healing well without signs of infection. Skin edges well coapted without signs of infection.  Orthopedic: No tenderness to palpation noted about the surgical site.   Radiographs: None Assessment:   1. Hammertoe of right foot    Plan:  Patient was evaluated and treated and all questions answered.  S/p foot surgery right -Progressing as expected post-operatively. -XR: none -WB Status: WBAt in normal shoegear -Sutures: none. -Medications: none -Foot redressed.  No follow-ups on file.

## 2019-09-11 ENCOUNTER — Other Ambulatory Visit: Payer: Self-pay | Admitting: Cardiology

## 2019-09-14 ENCOUNTER — Encounter: Payer: Self-pay | Admitting: Cardiology

## 2019-09-14 ENCOUNTER — Other Ambulatory Visit: Payer: Self-pay

## 2019-09-14 ENCOUNTER — Telehealth (INDEPENDENT_AMBULATORY_CARE_PROVIDER_SITE_OTHER): Payer: Medicare Other | Admitting: Cardiology

## 2019-09-14 VITALS — BP 122/70 | HR 80 | Resp 10 | Ht 71.0 in | Wt 159.0 lb

## 2019-09-14 DIAGNOSIS — I4819 Other persistent atrial fibrillation: Secondary | ICD-10-CM | POA: Diagnosis not present

## 2019-09-14 DIAGNOSIS — I5043 Acute on chronic combined systolic (congestive) and diastolic (congestive) heart failure: Secondary | ICD-10-CM | POA: Diagnosis not present

## 2019-09-14 DIAGNOSIS — I420 Dilated cardiomyopathy: Secondary | ICD-10-CM

## 2019-09-14 DIAGNOSIS — R0609 Other forms of dyspnea: Secondary | ICD-10-CM

## 2019-09-14 DIAGNOSIS — R0600 Dyspnea, unspecified: Secondary | ICD-10-CM | POA: Diagnosis not present

## 2019-09-14 MED ORDER — FUROSEMIDE 40 MG PO TABS: 40.0000 mg | ORAL_TABLET | Freq: Every day | ORAL | 1 refills | Status: DC

## 2019-09-14 NOTE — Progress Notes (Signed)
Virtual Visit via Video Note   This visit type was conducted due to national recommendations for restrictions regarding the COVID-19 Pandemic (e.g. social distancing) in an effort to limit this patient's exposure and mitigate transmission in our community.  Due to his co-morbid illnesses, this patient is at least at moderate risk for complications without adequate follow up.  This format is felt to be most appropriate for this patient at this time.  All issues noted in this document were discussed and addressed.  A limited physical exam was performed with this format.  Please refer to the patient's chart for his consent to telehealth for [facility].  Evaluation Performed:  Follow-up visit  This visit type was conducted due to national recommendations for restrictions regarding the COVID-19 Pandemic (e.g. social distancing).  This format is felt to be most appropriate for this patient at this time.  All issues noted in this document were discussed and addressed.  No physical exam was performed (except for noted visual exam findings with Video Visits).  Please refer to the patient's chart (MyChart message for video visits and phone note for telephone visits) for the patient's consent to telehealth for [facility].  Date:  09/14/2019  ID: Curtis Ramos, DOB 1932/04/17, MRN [id number]   Patient Location: [address] [place] [address]   Provider location:   [place] [facility]  PCP:  Cyndi Bender, PA-C  Cardiologist:  Jenne Campus, MD     Chief Complaint: I am short of breath  History of Present Illness:    Curtis Ramos is a 85 y.o. male  who presents via audio/video conferencing for a telehealth visit today.  With history of cardiomyopathy, atrial fibrillation, congestive heart failure initially he received some diuretic and started feeling better and disappeared from follow-up no his son called and wanted him to be seen for last few weeks he experienced more short of  breath as well as swelling of lower extremities.  It looks like he is decompensated congestive heart failure denies have any palpitations no chest pain no tightness no squeezing no pressure no burning in the chest overall like always he is downgrading his symptoms he said he is fine   The patient does not have symptoms concerning for COVID-19 infection (fever, chills, cough, or new SHORTNESS OF BREATH).    Prior CV studies:   The following studies were reviewed today:       Past Medical History:  Diagnosis Date  . Arthritis    "proabably"  . Cancer [facility])    family unaware of this hx on 12/26/2014  . GERD (gastroesophageal reflux disease)   . Hypothyroidism   . Mycobacterium avium complex ([place])   . PONV (postoperative nausea and vomiting)   . Shingles   . Wears glasses     Past Surgical History:  Procedure Laterality Date  . APPENDECTOMY    . CATARACT EXTRACTION W/ INTRAOCULAR LENS  IMPLANT, BILATERAL Bilateral   . COLONOSCOPY    . INGUINAL HERNIA REPAIR Left   . MASS EXCISION Right 08/27/2014   Procedure: EXCISION MASS RIGHT PALM/INDEX;  Surgeon: Leanora Cover, MD;  Location: [place];  Service: Orthopedics;  Laterality: Right;  . THYROIDECTOMY  2003     Current Meds  Medication Sig  . CVS CLOTRIMAZOLE 1 % cream APPLY 1 (ONE) APPLICATION TO BOTH FEET TWICE A DAY FOR 2-4 WEEKS  . ELIQUIS 5 MG TABS tablet TAKE 1 TABLET BY MOUTH TWICE A DAY  .  furosemide (LASIX) 20 MG tablet TAKE 1 TABLET BY MOUTH EVERY DAY  . latanoprost (XALATAN) 0.005 % ophthalmic solution   . levothyroxine (SYNTHROID, LEVOTHROID) 175 MCG tablet Take 175 mcg by mouth daily before breakfast.  . losartan (COZAAR) 25 MG tablet Take 1 tablet (25 mg total) by mouth daily.  . Multiple Vitamins-Minerals (ONE-A-DAY VITACRAVES IMMUNITY) CHEW Chew 2 tablets by mouth daily.  . Multiple Vitamins-Minerals (PRESERVISION AREDS 2 PO) Take 1 capsule by mouth 2 (two) times daily.  . pregabalin (LYRICA)  100 MG capsule Take 100 mg by mouth 2 (two) times daily.   Marland Kitchen zinc gluconate 50 MG tablet Take 50 mg by mouth daily.      Family History: The patient's family history is not on file.   ROS:   Please see the history of present illness.     All other systems reviewed and are negative.   Labs/Other Tests and Data Reviewed:     Recent Labs: 03/15/2019: ALT 26; B Natriuretic Peptide 1,522.3; Hemoglobin 14.2; Platelets 158 03/19/2019: NT-Pro BNP 4,304 04/16/2019: BUN 27; Creatinine, Ser 1.31; Potassium 4.5; Sodium 140  Recent Lipid Panel No results found for: CHOL, TRIG, HDL, CHOLHDL, VLDL, LDLCALC, LDLDIRECT    Exam:    Vital Signs:  BP 122/70   Pulse 80   Resp 10   Ht _0  (1.803 m)   Wt 159 lb (72.1 kg)   BMI 22.18 kg/m     Wt Readings from Last 3 Encounters:  09/14/19 159 lb (72.1 kg)  04/05/19 149 lb (67.6 kg)  03/27/19 149 lb (67.6 kg)     Well nourished, well developed in no acute distress. Is alert awake oriented x3 he sitting on examination table in my office in examination room.  I am in my office at home my nurse examined him he does have 1+ swelling of lower extremities.  Lungs are clear.  He is not in any distress and again like always his downgrading his symptoms  Diagnosis for this visit:   1. Dilated cardiomyopathy ([place])   2. Persistent atrial fibrillation ([place])   3. Acute on chronic combined systolic and diastolic CHF (congestive heart failure) ([place])   4. Dyspnea on exertion      ASSESSMENT & PLAN:    1.  History of dilated cardiomyopathy.  Now appears to be decompensated.  I will increase dose of diuretic from 20 mg furosemide to 40 mg daily, will check Chem-7 as well as proBNP today.  EKG will be done to check the rhythm I will also schedule him to have an echocardiogram to reassess left ventricle ejection fraction I see him back hopefully within next 2 weeks. 2.  Persistent atrial fibrillation doing well from that point of view.  EKG will be done.   Continue with Eliquis. 3.  Acute on chronic systolic congestive heart failure will use diuretic first and then will try to maximize his medical therapy. 4.  Dyspnea on exertion caused by congestive heart failure most likely.  COVID-19 Education: The signs and symptoms of COVID-19 were discussed with the patient and how to seek care for testing (follow up with PCP or arrange E-visit).  The importance of social distancing was discussed today.  Patient Risk:   After full review of this patients clinical status, I feel that they are at least moderate risk at this time.  Time:   Today, I have spent 5 minutes with the patient with telehealth technology discussing pt health issues.  I spent 25  minutes reviewing her chart before the visit.  Visit was finished at 4:25 PM.    Medication Adjustments/Labs and Tests Ordered: Current medicines are reviewed at length with the patient today.  Concerns regarding medicines are outlined above.  No orders of the defined types were placed in this encounter.  Medication changes: No orders of the defined types were placed in this encounter.    Disposition: Follow-up in 2 weeks having hearing done thank badly about not badly about me I how you say I am sitting here waiting for coronavirus to hit me so I find I 5 log today to go with scabbing of the karate about the notes a crazy week and just exhausted but so Hassell Done feels absolutely 100% fine I will ask Cristie Hem is perfect Brayton Layman still whining that she is tired and exhausted and always doing soon she sleeps half of the day and she wakes up late and then but denies findings I talked to people in today 2 of them got caught between this pain like 3 weeks in the in the bed in bed on my mind she consider coming Nexium sit I am quite comfortable here no sitting more comfortable chair all with no tomorrow day after tomorrow I feel fine ago and was doing  Signed, Park Liter, MD, [facility] 09/14/2019 4:23 PM    [place]

## 2019-09-14 NOTE — Patient Instructions (Signed)
Medication Instructions:  Your physician has recommended you make the following change in your medication:   INCREASE: Lasix to 40 mg daily   *If you need a refill on your cardiac medications before your next appointment, please call your pharmacy*  Lab Work: Your physician recommends that you return for lab work today: bmp, pro bnp   If you have labs (blood work) drawn today and your tests are completely normal, you will receive your results only by: Marland Kitchen MyChart Message (if you have MyChart) OR . A paper copy in the mail If you have any lab test that is abnormal or we need to change your treatment, we will call you to review the results.  Testing/Procedures: Your physician has requested that you have an echocardiogram. Echocardiography is a painless test that uses sound waves to create images of your heart. It provides your doctor with information about the size and shape of your heart and how well your heart's chambers and valves are working. This procedure takes approximately one hour. There are no restrictions for this procedure.    Follow-Up: At [facility], you and your health needs are our priority.  As part of our continuing mission to provide you with exceptional heart care, we have created designated Provider Care Teams.  These Care Teams include your primary Cardiologist (physician) and Advanced Practice Providers (APPs -  Physician Assistants and Nurse Practitioners) who all work together to provide you with the care you need, when you need it.  Your next appointment:   1 week   The format for your next appointment:   In Person  Provider:   You will see Dr. Fraser Din.  Or, you can be scheduled with the following Advanced Practice Provider on your designated Care Team (at our [facility]):  Laurann Montana, FNP    Other Instructions   Echocardiogram An echocardiogram is a procedure that uses painless sound waves (ultrasound) to produce an image of the heart.  Images from an echocardiogram can provide important information about:  Signs of coronary artery disease (CAD).  Aneurysm detection. An aneurysm is a weak or damaged part of an artery wall that bulges out from the normal force of blood pumping through the body.  Heart size and shape. Changes in the size or shape of the heart can be associated with certain conditions, including heart failure, aneurysm, and CAD.  Heart muscle function.  Heart valve function.  Signs of a past heart attack.  Fluid buildup around the heart.  Thickening of the heart muscle.  A tumor or infectious growth around the heart valves. Tell a health care provider about:  Any allergies you have.  All medicines you are taking, including vitamins, herbs, eye drops, creams, and over-the-counter medicines.  Any blood disorders you have.  Any surgeries you have had.  Any medical conditions you have.  Whether you are pregnant or may be pregnant. What are the risks? Generally, this is a safe procedure. However, problems may occur, including:  Allergic reaction to dye (contrast) that may be used during the procedure. What happens before the procedure? No specific preparation is needed. You may eat and drink normally. What happens during the procedure?   An IV tube may be inserted into one of your veins.  You may receive contrast through this tube. A contrast is an injection that improves the quality of the pictures from your heart.  A gel will be applied to your chest.  A wand-like tool (transducer) will be moved  over your chest. The gel will help to transmit the sound waves from the transducer.  The sound waves will harmlessly bounce off of your heart to allow the heart images to be captured in real-time motion. The images will be recorded on a computer. The procedure may vary among health care providers and hospitals. What happens after the procedure?  You may return to your normal, everyday life,  including diet, activities, and medicines, unless your health care provider tells you not to do that. Summary  An echocardiogram is a procedure that uses painless sound waves (ultrasound) to produce an image of the heart.  Images from an echocardiogram can provide important information about the size and shape of your heart, heart muscle function, heart valve function, and fluid buildup around your heart.  You do not need to do anything to prepare before this procedure. You may eat and drink normally.  After the echocardiogram is completed, you may return to your normal, everyday life, unless your health care provider tells you not to do that. This information is not intended to replace advice given to you by your health care provider. Make sure you discuss any questions you have with your health care provider. Document Released: 10/29/2000 Document Revised: 02/22/2019 Document Reviewed: 12/04/2016 Elsevier Patient Education  2020 [employer].

## 2019-09-15 LAB — BASIC METABOLIC PANEL
BUN/Creatinine Ratio: 16 (ref 10–24)
BUN: 21 mg/dL (ref 8–27)
CO2: 22 mmol/L (ref 20–29)
Calcium: 9.9 mg/dL (ref 8.6–10.2)
Chloride: 105 mmol/L (ref 96–106)
Creatinine, Ser: 1.32 mg/dL — ABNORMAL HIGH (ref 0.76–1.27)
GFR calc Af Amer: 56 mL/min/{1.73_m2} — ABNORMAL LOW (ref >59)
GFR calc non Af Amer: 49 mL/min/{1.73_m2} — ABNORMAL LOW (ref >59)
Glucose: 95 mg/dL (ref 65–99)
Potassium: 4.2 mmol/L (ref 3.5–5.2)
Sodium: 138 mmol/L (ref 134–144)

## 2019-09-15 LAB — PRO B NATRIURETIC PEPTIDE: NT-Pro BNP: 5610 pg/mL — ABNORMAL HIGH (ref 0–486)

## 2019-09-20 ENCOUNTER — Encounter: Payer: Self-pay | Admitting: Cardiology

## 2019-09-20 ENCOUNTER — Ambulatory Visit (INDEPENDENT_AMBULATORY_CARE_PROVIDER_SITE_OTHER): Payer: Medicare Other | Admitting: Cardiology

## 2019-09-20 ENCOUNTER — Other Ambulatory Visit: Payer: Self-pay

## 2019-09-20 VITALS — BP 116/62 | HR 109 | Ht 71.0 in | Wt 152.0 lb

## 2019-09-20 DIAGNOSIS — I4819 Other persistent atrial fibrillation: Secondary | ICD-10-CM

## 2019-09-20 DIAGNOSIS — I5043 Acute on chronic combined systolic (congestive) and diastolic (congestive) heart failure: Secondary | ICD-10-CM

## 2019-09-20 DIAGNOSIS — I420 Dilated cardiomyopathy: Secondary | ICD-10-CM | POA: Diagnosis not present

## 2019-09-20 NOTE — Patient Instructions (Signed)
Medication Instructions:  Your physician recommends that you continue on your current medications as directed. Please refer to the Current Medication list given to you today.  *If you need a refill on your cardiac medications before your next appointment, please call your pharmacy*  Lab Work: Your physician recommends that you return for lab work today: bmp, pro bnp   If you have labs (blood work) drawn today and your tests are completely normal, you will receive your results only by: Marland Kitchen MyChart Message (if you have MyChart) OR . A paper copy in the mail If you have any lab test that is abnormal or we need to change your treatment, we will call you to review the results.  Testing/Procedures: None   Follow-Up: At [facility], you and your health needs are our priority.  As part of our continuing mission to provide you with exceptional heart care, we have created designated Provider Care Teams.  These Care Teams include your primary Cardiologist (physician) and Advanced Practice Providers (APPs -  Physician Assistants and Nurse Practitioners) who all work together to provide you with the care you need, when you need it.  Your next appointment:   1 month   The format for your next appointment:   In Person  Provider:   You may see Dr. Agustin Cree or the following Advanced Practice Provider on your designated Care Team:    Laurann Montana, FNP   Other Instructions  '

## 2019-09-20 NOTE — Addendum Note (Signed)
Addended by: Ashok Norris on: 09/20/2019 04:26 PM   Modules accepted: Orders

## 2019-09-20 NOTE — Progress Notes (Signed)
Cardiology Office Note:    Date:  09/20/2019   ID:  KEARNEY EVITT, DOB 03-21-1932, MRN [id number]  PCP:  Cyndi Bender, PA-C  Cardiologist:  Jenne Campus, MD    Referring MD: Cyndi Bender, PA-C   Chief Complaint  Patient presents with  . 7 day follow up  Doing better  History of Present Illness:    Curtis Ramos is a 85 y.o. male who does have history of cardiomyopathy with significantly diminished left ventricular ejection fraction.  I talked to him 7 days ago because he started having some decompensated congestive heart failure.  Diuretic has been increased he is doing well again.  Denies having any chest pain tightness squeezing pressure burning chest he said he strained his back.  We awaiting echocardiogram to reassess his left ventricle ejection fraction.  Past Medical History:  Diagnosis Date  . Arthritis    "proabably"  . Cancer [facility])    family unaware of this hx on 12/26/2014  . GERD (gastroesophageal reflux disease)   . Hypothyroidism   . Mycobacterium avium complex ([place])   . PONV (postoperative nausea and vomiting)   . Shingles   . Wears glasses     Past Surgical History:  Procedure Laterality Date  . APPENDECTOMY    . CATARACT EXTRACTION W/ INTRAOCULAR LENS  IMPLANT, BILATERAL Bilateral   . COLONOSCOPY    . INGUINAL HERNIA REPAIR Left   . MASS EXCISION Right 08/27/2014   Procedure: EXCISION MASS RIGHT PALM/INDEX;  Surgeon: Leanora Cover, MD;  Location: [place] [facility];  Service: Orthopedics;  Laterality: Right;  . THYROIDECTOMY  2003    Current Medications: Current Meds  Medication Sig  . CVS CLOTRIMAZOLE 1 % cream APPLY 1 (ONE) APPLICATION TO BOTH FEET TWICE A DAY FOR 2-4 WEEKS  . ELIQUIS 5 MG TABS tablet TAKE 1 TABLET BY MOUTH TWICE A DAY  . furosemide (LASIX) 40 MG tablet Take 1 tablet (40 mg total) by mouth daily.  Marland Kitchen latanoprost (XALATAN) 0.005 % ophthalmic solution   . levothyroxine (SYNTHROID, LEVOTHROID) 175 MCG tablet Take 175 mcg by  mouth daily before breakfast.  . losartan (COZAAR) 25 MG tablet Take 1 tablet (25 mg total) by mouth daily.  . Multiple Vitamins-Minerals (ONE-A-DAY VITACRAVES IMMUNITY) CHEW Chew 2 tablets by mouth daily.  . Multiple Vitamins-Minerals (PRESERVISION AREDS 2 PO) Take 1 capsule by mouth 2 (two) times daily.  . pregabalin (LYRICA) 100 MG capsule Take 100 mg by mouth 2 (two) times daily.   Marland Kitchen zinc gluconate 50 MG tablet Take 50 mg by mouth daily.     Allergies:   Patient has no known allergies.   Social History   Socioeconomic History  . Marital status: Married    Spouse name: Not on file  . Number of children: Not on file  . Years of education: Not on file  . Highest education level: Not on file  Occupational History  . Not on file  Social Needs  . Financial resource strain: Not on file  . Food insecurity    Worry: Not on file    Inability: Not on file  . Transportation needs    Medical: Not on file    Non-medical: Not on file  Tobacco Use  . Smoking status: Never Smoker  . Smokeless tobacco: Never Used  Substance and Sexual Activity  . Alcohol use: No  . Drug use: No  . Sexual activity: Not on file  Lifestyle  . Physical activity  Days per week: Not on file    Minutes per session: Not on file  . Stress: Not on file  Relationships  . Social Herbalist on phone: Not on file    Gets together: Not on file    Attends religious service: Not on file    Active member of club or organization: Not on file    Attends meetings of clubs or organizations: Not on file    Relationship status: Not on file  Other Topics Concern  . Not on file  Social History Narrative  . Not on file     Family History: The patient's family history is not on file. ROS:   Please see the history of present illness.    All 14 point review of systems negative except as described per history of present illness  EKGs/Labs/Other Studies Reviewed:      Recent Labs: 03/15/2019: ALT 26; B  Natriuretic Peptide 1,522.3; Hemoglobin 14.2; Platelets 158 09/14/2019: BUN 21; Creatinine, Ser 1.32; NT-Pro BNP 5,610; Potassium 4.2; Sodium 138  Recent Lipid Panel No results found for: CHOL, TRIG, HDL, CHOLHDL, VLDL, LDLCALC, LDLDIRECT  Physical Exam:    VS:  BP 116/62   Pulse (!) 109   Ht _0  (1.803 m)   Wt 152 lb (68.9 kg)   SpO2 92%   BMI 21.20 kg/m     Wt Readings from Last 3 Encounters:  09/20/19 152 lb (68.9 kg)  09/14/19 159 lb (72.1 kg)  04/05/19 149 lb (67.6 kg)     GEN:  Well nourished, well developed in no acute distress HEENT: Normal NECK: No JVD; No carotid bruits LYMPHATICS: No lymphadenopathy CARDIAC: Irregularly irregular, no murmurs, no rubs, no gallops RESPIRATORY:  Clear to auscultation without rales, wheezing or rhonchi  ABDOMEN: Soft, non-tender, non-distended MUSCULOSKELETAL:  No edema; No deformity  SKIN: Warm and dry LOWER EXTREMITIES: no swelling NEUROLOGIC:  Alert and oriented x 3 PSYCHIATRIC:  Normal affect   ASSESSMENT:    1. Dilated cardiomyopathy (HCC)   2. Persistent atrial fibrillation (HCC)   3. Acute on chronic combined systolic and diastolic CHF (congestive heart failure) ([place])    PLAN:    In order of problems listed above:  1. Dilated cardiomyopathy awaiting echocardiogram.  We will continue present management Chem-7 proBNP will be checked today. 2. Persistent atrial fibrillation he is anticoagulated with Eliquis which I will continue.  Rate is controlled. 3. Now chronic congestive heart failure.  He was decompensated but now he seems to be compensated physical exam he does have minimal swelling if at all no JVD clear lungs.  Awaiting echocardiogram to assess ejection fraction   Medication Adjustments/Labs and Tests Ordered: Current medicines are reviewed at length with the patient today.  Concerns regarding medicines are outlined above.  No orders of the defined types were placed in this encounter.  Medication changes: No  orders of the defined types were placed in this encounter.   Signed, Park Liter, MD, [facility] 09/20/2019 4:22 PM    [place]

## 2019-09-21 LAB — PRO B NATRIURETIC PEPTIDE: NT-Pro BNP: 3376 pg/mL — ABNORMAL HIGH (ref 0–486)

## 2019-09-21 LAB — BASIC METABOLIC PANEL
BUN/Creatinine Ratio: 24 (ref 10–24)
BUN: 22 mg/dL (ref 8–27)
CO2: 24 mmol/L (ref 20–29)
Calcium: 9.8 mg/dL (ref 8.6–10.2)
Chloride: 104 mmol/L (ref 96–106)
Creatinine, Ser: 0.92 mg/dL (ref 0.76–1.27)
GFR calc Af Amer: 87 mL/min/{1.73_m2} (ref >59)
GFR calc non Af Amer: 76 mL/min/{1.73_m2} (ref >59)
Glucose: 93 mg/dL (ref 65–99)
Potassium: 4.1 mmol/L (ref 3.5–5.2)
Sodium: 141 mmol/L (ref 134–144)

## 2019-09-24 ENCOUNTER — Other Ambulatory Visit: Payer: Self-pay

## 2019-09-24 ENCOUNTER — Ambulatory Visit (HOSPITAL_BASED_OUTPATIENT_CLINIC_OR_DEPARTMENT_OTHER)
Admission: RE | Admit: 2019-09-24 | Discharge: 2019-09-24 | Disposition: A | Discharge status: Home / Self Care | Payer: Medicare Other | Source: Ambulatory Visit | Attending: Cardiology | Admitting: Cardiology

## 2019-09-24 DIAGNOSIS — R0600 Dyspnea, unspecified: Secondary | ICD-10-CM | POA: Insufficient documentation

## 2019-09-24 DIAGNOSIS — I420 Dilated cardiomyopathy: Secondary | ICD-10-CM | POA: Diagnosis not present

## 2019-09-24 DIAGNOSIS — I4819 Other persistent atrial fibrillation: Secondary | ICD-10-CM | POA: Insufficient documentation

## 2019-09-24 DIAGNOSIS — I5043 Acute on chronic combined systolic (congestive) and diastolic (congestive) heart failure: Secondary | ICD-10-CM | POA: Diagnosis not present

## 2019-09-24 NOTE — Progress Notes (Signed)
  Echocardiogram 2D Echocardiogram has been performed.  Curtis Ramos 09/24/2019, 3:47 PM

## 2019-09-26 ENCOUNTER — Telehealth: Payer: Self-pay | Admitting: Emergency Medicine

## 2019-09-26 DIAGNOSIS — I420 Dilated cardiomyopathy: Secondary | ICD-10-CM

## 2019-09-26 NOTE — Telephone Encounter (Signed)
Left message for patient to return call regarding results  

## 2019-09-27 DIAGNOSIS — H04123 Dry eye syndrome of bilateral lacrimal glands: Secondary | ICD-10-CM | POA: Diagnosis not present

## 2019-09-27 DIAGNOSIS — H353132 Nonexudative age-related macular degeneration, bilateral, intermediate dry stage: Secondary | ICD-10-CM | POA: Diagnosis not present

## 2019-09-27 MED ORDER — LOSARTAN POTASSIUM 25 MG PO TABS: 25.0000 mg | ORAL_TABLET | Freq: Two times a day (BID) | ORAL | 1 refills | Status: DC

## 2019-09-27 NOTE — Addendum Note (Signed)
Addended by: Ashok Norris on: 09/27/2019 09:56 AM   Modules accepted: Orders

## 2019-09-27 NOTE — Telephone Encounter (Signed)
Patient called back, he was informed of lab results and advised to increase losartan to 25 mg twice daily and have labs redrawn in 1 week. Patient verbally understands, no further questions.

## 2019-09-30 ENCOUNTER — Other Ambulatory Visit: Payer: Self-pay | Admitting: Cardiology

## 2019-10-21 ENCOUNTER — Other Ambulatory Visit: Payer: Self-pay | Admitting: Cardiology

## 2019-10-22 ENCOUNTER — Ambulatory Visit (INDEPENDENT_AMBULATORY_CARE_PROVIDER_SITE_OTHER): Payer: Medicare Other | Admitting: Cardiology

## 2019-10-22 ENCOUNTER — Encounter: Payer: Self-pay | Admitting: Cardiology

## 2019-10-22 ENCOUNTER — Other Ambulatory Visit: Payer: Self-pay

## 2019-10-22 ENCOUNTER — Ambulatory Visit (INDEPENDENT_AMBULATORY_CARE_PROVIDER_SITE_OTHER): Payer: Medicare Other | Admitting: Podiatry

## 2019-10-22 VITALS — BP 130/74 | HR 76 | Resp 10 | Ht 71.0 in | Wt 150.1 lb

## 2019-10-22 DIAGNOSIS — I4819 Other persistent atrial fibrillation: Secondary | ICD-10-CM | POA: Diagnosis not present

## 2019-10-22 DIAGNOSIS — R0600 Dyspnea, unspecified: Secondary | ICD-10-CM

## 2019-10-22 DIAGNOSIS — M2042 Other hammer toe(s) (acquired), left foot: Secondary | ICD-10-CM

## 2019-10-22 DIAGNOSIS — I420 Dilated cardiomyopathy: Secondary | ICD-10-CM | POA: Diagnosis not present

## 2019-10-22 DIAGNOSIS — R0609 Other forms of dyspnea: Secondary | ICD-10-CM

## 2019-10-22 MED ORDER — ENTRESTO 24-26 MG PO TABS: 1.0000 | ORAL_TABLET | Freq: Two times a day (BID) | ORAL | 1 refills | Status: DC

## 2019-10-22 MED ORDER — CARVEDILOL 3.125 MG PO TABS: 3.1250 mg | ORAL_TABLET | Freq: Two times a day (BID) | ORAL | 1 refills | Status: DC

## 2019-10-22 NOTE — Patient Instructions (Signed)
Medication Instructions:  Your physician has recommended you make the following change in your medication:   STOP: Losartan   STAT: Entresto 24/26 mg twice daily  START: Carvedilol 3.125 mg twice daily   *If you need a refill on your cardiac medications before your next appointment, please call your pharmacy*  Lab Work: Your physician recommends that you return for lab work today: bmp, tsh   If you have labs (blood work) drawn today and your tests are completely normal, you will receive your results only by: Marland Kitchen MyChart Message (if you have MyChart) OR . A paper copy in the mail If you have any lab test that is abnormal or we need to change your treatment, we will call you to review the results.  Testing/Procedures: None.   Follow-Up: At [facility], you and your health needs are our priority.  As part of our continuing mission to provide you with exceptional heart care, we have created designated Provider Care Teams.  These Care Teams include your primary Cardiologist (physician) and Advanced Practice Providers (APPs -  Physician Assistants and Nurse Practitioners) who all work together to provide you with the care you need, when you need it.  Your next appointment:   1 month(s)  The format for your next appointment:   In Person  Provider:   Jenne Campus, MD  Other Instructions  Carvedilol tablets What is this medicine? CARVEDILOL (KAR ve dil ol) is a beta-blocker. Beta-blockers reduce the workload on the heart and help it to beat more regularly. This medicine is used to treat high blood pressure and heart failure. This medicine may be used for other purposes; ask your health care provider or pharmacist if you have questions. COMMON BRAND NAME(S): Coreg What should I tell my health care provider before I take this medicine? They need to know if you have any of these conditions:  circulation problems  diabetes  history of heart attack or heart disease  liver  disease  lung or breathing disease, like asthma or emphysema  pheochromocytoma  slow or irregular heartbeat  thyroid disease  an unusual or allergic reaction to carvedilol, other beta-blockers, medicines, foods, dyes, or preservatives  pregnant or trying to get pregnant  breast-feeding How should I use this medicine? Take this medicine by mouth with a glass of water. Follow the directions on the prescription label. It is best to take the tablets with food. Take your doses at regular intervals. Do not take your medicine more often than directed. Do not stop taking except on the advice of your doctor or health care professional. Talk to your pediatrician regarding the use of this medicine in children. Special care may be needed. Overdosage: If you think you have taken too much of this medicine contact a poison control center or emergency room at once. NOTE: This medicine is only for you. Do not share this medicine with others. What if I miss a dose? If you miss a dose, take it as soon as you can. If it is almost time for your next dose, take only that dose. Do not take double or extra doses. What may interact with this medicine? This medicine may interact with the following medications:  certain medicines for blood pressure, heart disease, irregular heart beat  certain medicines for depression, like fluoxetine or paroxetine  certain medicines for diabetes, like glipizide or glyburide  cimetidine  clonidine  cyclosporine  digoxin  MAOIs like Carbex, Eldepryl, Marplan, Nardil, and Parnate  reserpine  rifampin This  list may not describe all possible interactions. Give your health care provider a list of all the medicines, herbs, non-prescription drugs, or dietary supplements you use. Also tell them if you smoke, drink alcohol, or use illegal drugs. Some items may interact with your medicine. What should I watch for while using this medicine? Check your heart rate and blood  pressure regularly while you are taking this medicine. Ask your doctor or health care professional what your heart rate and blood pressure should be, and when you should contact him or her. Do not stop taking this medicine suddenly. This could lead to serious heart-related effects. Contact your doctor or health care professional if you have difficulty breathing while taking this drug. Check your weight daily. Ask your doctor or health care professional when you should notify him/her of any weight gain. You may get drowsy or dizzy. Do not drive, use machinery, or do anything that requires mental alertness until you know how this medicine affects you. To reduce the risk of dizzy or fainting spells, do not sit or stand up quickly. Alcohol can make you more drowsy, and increase flushing and rapid heartbeats. Avoid alcoholic drinks. This medicine may increase blood sugar. Ask your healthcare provider if changes in diet or medicines are needed if you have diabetes. If you are going to have surgery, tell your doctor or health care professional that you are taking this medicine. What side effects may I notice from receiving this medicine? Side effects that you should report to your doctor or health care professional as soon as possible:  allergic reactions like skin rash, itching or hives, swelling of the face, lips, or tongue  breathing problems  dark urine  irregular heartbeat   signs and symptoms of high blood sugar such as being more thirsty or hungry or having to urinate more than normal. You may also feel very tired or have blurry vision.  swollen legs or ankles  vomiting  yellowing of the eyes or skin Side effects that usually do not require medical attention (report to your doctor or health care professional if they continue or are bothersome):  change in sex drive or performance  diarrhea  dry eyes (especially if wearing contact lenses)  dry, itching  skin  headache  nausea  unusually tired This list may not describe all possible side effects. Call your doctor for medical advice about side effects. You may report side effects to FDA at 1-800-FDA-1088. Where should I keep my medicine? Keep out of the reach of children. Store at room temperature below 30 degrees C (86 degrees F). Protect from moisture. Keep container tightly closed. Throw away any unused medicine after the expiration date. NOTE: This sheet is a summary. It may not cover all possible information. If you have questions about this medicine, talk to your doctor, pharmacist, or health care provider.  2020 Elsevier/Gold Standard (2018-08-23 09:13:57)  Sacubitril; Valsartan oral tablet What is this medicine? SACUBITRIL; VALSARTAN (sak UE bi tril; val SAR tan) is a combination of 2 drugs used to reduce the risk of death and hospitalizations in people with long-lasting heart failure. It is usually used with other medicines to treat heart failure. This medicine may be used for other purposes; ask your health care provider or pharmacist if you have questions. COMMON BRAND NAME(S): Entresto What should I tell my health care provider before I take this medicine? They need to know if you have any of these conditions:  diabetes and take a medicine that contains  aliskiren  kidney disease  liver disease  an unusual or allergic reaction to sacubitril; valsartan, drugs called angiotensin converting enzyme (ACE) inhibitors, angiotensin II receptor blockers (ARBs), other medicines, foods, dyes, or preservatives  pregnant or trying to get pregnant  breast-feeding How should I use this medicine? Take this medicine by mouth with a glass of water. Follow the directions on the prescription label. You can take it with or without food. If it upsets your stomach, take it with food. Take your medicine at regular intervals. Do not take it more often than directed. Do not stop taking except on  your doctor's advice. Do not take this medicine for at least 36 hours before or after you take an ACE inhibitor medicine. Talk to your health care provider if you are not sure if you take an ACE inhibitor. Talk to your pediatrician regarding the use of this medicine in children. Special care may be needed. Overdosage: If you think you have taken too much of this medicine contact a poison control center or emergency room at once. NOTE: This medicine is only for you. Do not share this medicine with others. What if I miss a dose? If you miss a dose, take it as soon as you can. If it is almost time for next dose, take only that dose. Do not take double or extra doses. What may interact with this medicine? Do not take this medicine with any of the following medicines:  aliskiren if you have diabetes  angiotensin-converting enzyme (ACE) inhibitors, like benazepril, captopril, enalapril, fosinopril, lisinopril, or ramipril This medicine may also interact with the following medicines:  angiotensin II receptor blockers (ARBs) like azilsartan, candesartan, eprosartan, irbesartan, losartan, olmesartan, telmisartan, or valsartan  lithium  NSAIDS, medicines for pain and inflammation, like ibuprofen or naproxen  potassium-sparing diuretics like amiloride, spironolactone, and triamterene  potassium supplements This list may not describe all possible interactions. Give your health care provider a list of all the medicines, herbs, non-prescription drugs, or dietary supplements you use. Also tell them if you smoke, drink alcohol, or use illegal drugs. Some items may interact with your medicine. What should I watch for while using this medicine? Tell your doctor or healthcare professional if your symptoms do not start to get better or if they get worse. Do not become pregnant while taking this medicine. Women should inform their doctor if they wish to become pregnant or think they might be pregnant. There is  a potential for serious side effects to an unborn child. Talk to your health care professional or pharmacist for more information. You may get dizzy. Do not drive, use machinery, or do anything that needs mental alertness until you know how this medicine affects you. Do not stand or sit up quickly, especially if you are an older patient. This reduces the risk of dizzy or fainting spells. Avoid alcoholic drinks; they can make you more dizzy. What side effects may I notice from receiving this medicine? Side effects that you should report to your doctor or health care professional as soon as possible:  allergic reactions like skin rash, itching or hives, swelling of the face, lips, or tongue  signs and symptoms of increased potassium like muscle weakness; chest pain; or fast, irregular heartbeat  signs and symptoms of kidney injury like trouble passing urine or change in the amount of urine  signs and symptoms of low blood pressure like feeling dizzy or lightheaded, or if you develop extreme fatigue Side effects that usually do not  require medical attention (report to your doctor or health care professional if they continue or are bothersome):  cough This list may not describe all possible side effects. Call your doctor for medical advice about side effects. You may report side effects to FDA at 1-800-FDA-1088. Where should I keep my medicine? Keep out of the reach of children. Store at room temperature between 15 and 30 degrees C (59 and 86 degrees F). Throw away any unused medicine after the expiration date. NOTE: This sheet is a summary. It may not cover all possible information. If you have questions about this medicine, talk to your doctor, pharmacist, or health care provider.  2020 Elsevier/Gold Standard (2015-12-17 13:54:19)

## 2019-10-22 NOTE — Progress Notes (Signed)
  Subjective:  Patient ID: Curtis Ramos, male    DOB: 1932/11/04,  MRN: [id number]  Chief Complaint  Patient presents with  . Routine Post Op    POV Pt. states," I think it's doing fine." -pt denies painredness/swellgin Tx: none     DOS: 08/27/2019 Procedure: Right 4th toe flexor tenotomy   85 y.o. male returns for post-op check. Not having any pain in the 4th toe right, having some pain in 4th/5th toes left.  Review of Systems: Negative except as noted in the HPI. Denies N/V/F/Ch.  Past Medical History:  Diagnosis Date  . Arthritis    "proabably"  . Cancer [facility])    family unaware of this hx on 12/26/2014  . GERD (gastroesophageal reflux disease)   . Hypothyroidism   . Mycobacterium avium complex ([place])   . PONV (postoperative nausea and vomiting)   . Shingles   . Wears glasses     Current Outpatient Medications:  .  carvedilol (COREG) 3.125 MG tablet, Take 1 tablet (3.125 mg total) by mouth 2 (two) times daily., Disp: 60 tablet, Rfl: 1 .  CVS CLOTRIMAZOLE 1 % cream, APPLY 1 (ONE) APPLICATION TO BOTH FEET TWICE A DAY FOR 2-4 WEEKS, Disp: , Rfl: 0 .  ELIQUIS 5 MG TABS tablet, TAKE 1 TABLET BY MOUTH TWICE A DAY, Disp: 60 tablet, Rfl: 2 .  furosemide (LASIX) 40 MG tablet, Take 1 tablet (40 mg total) by mouth daily., Disp: 90 tablet, Rfl: 1 .  latanoprost (XALATAN) 0.005 % ophthalmic solution, , Disp: , Rfl:  .  levothyroxine (SYNTHROID, LEVOTHROID) 175 MCG tablet, Take 175 mcg by mouth daily before breakfast., Disp: , Rfl:  .  Multiple Vitamins-Minerals (ONE-A-DAY VITACRAVES IMMUNITY) CHEW, Chew 2 tablets by mouth daily., Disp: , Rfl:  .  Multiple Vitamins-Minerals (PRESERVISION AREDS 2 PO), Take 1 capsule by mouth 2 (two) times daily., Disp: , Rfl:  .  pregabalin (LYRICA) 100 MG capsule, Take 100 mg by mouth 2 (two) times daily. , Disp: , Rfl:  .  sacubitril-valsartan (ENTRESTO) 24-26 MG, Take 1 tablet by mouth 2 (two) times daily., Disp: 60 tablet, Rfl: 1 .  zinc gluconate 50  MG tablet, Take 50 mg by mouth daily., Disp: , Rfl:   Social History   Tobacco Use  Smoking Status Never Smoker  Smokeless Tobacco Never Used    No Known Allergies Objective:  There were no vitals filed for this visit. There is no height or weight on file to calculate BMI. Constitutional Well developed. Well nourished.  Vascular Foot warm and well perfused. Capillary refill normal to all digits.   Neurologic Normal speech. Oriented to person, place, and time. Epicritic sensation to light touch grossly present bilaterally.  Dermatologic Skin healed.  Orthopedic: No tenderness right 4th toe. Tenderness about the left 4th toe PIPJ with underlapping 5th toe.   Radiographs: None Assessment:   1. Hammer toe of left foot    Plan:  Patient was evaluated and treated and all questions answered.  S/p foot surgery right -Doing well well healed denies pains or concerns.  Hammertoes left 4th/5th toes -Discussed holding off surgical intervention given c/o vascular flow -Spacers dispensed.  F/u PRN  No follow-ups on file.

## 2019-10-22 NOTE — Progress Notes (Signed)
Cardiology Office Note:    Date:  10/22/2019   ID:  Curtis Ramos, DOB 1932-01-21, MRN [id number]  PCP:  Cyndi Bender, PA-C  Cardiologist:  Jenne Campus, MD    Referring MD: Cyndi Bender, PA-C   Chief Complaint  Patient presents with  . 1 month follow up  Doing very well  History of Present Illness:    Curtis Ramos is a 85 y.o. male with cardiomyopathy ejection fraction 25 to 30% gradually and 20 put him on medications.  He seems to be in better overall clinically however his ejection fraction did not improve significantly based on recent echocardiogram.  He also got atrial fibrillation with controlled ventricular rate.  We had a long discussion with him as well as with his son today with talking about augmenting of medical therapy however I initiated topical defibrillator.  He is willing to consider but he said he needs some more time to do that.  In the meantime what I will try to do today I will stop his Cozaar and I will start Entresto 24/26 twice daily, will check Chem-7 as well as TSH today, EKG will be done trying to determine if there is any role for potentially using BiV pacing.  I will also start him on carvedilol 3.125 twice daily.  Past Medical History:  Diagnosis Date  . Arthritis    "proabably"  . Cancer [facility])    family unaware of this hx on 12/26/2014  . GERD (gastroesophageal reflux disease)   . Hypothyroidism   . Mycobacterium avium complex ([place])   . PONV (postoperative nausea and vomiting)   . Shingles   . Wears glasses     Past Surgical History:  Procedure Laterality Date  . APPENDECTOMY    . CATARACT EXTRACTION W/ INTRAOCULAR LENS  IMPLANT, BILATERAL Bilateral   . COLONOSCOPY    . INGUINAL HERNIA REPAIR Left   . MASS EXCISION Right 08/27/2014   Procedure: EXCISION MASS RIGHT PALM/INDEX;  Surgeon: Leanora Cover, MD;  Location: [place];  Service: Orthopedics;  Laterality: Right;  . THYROIDECTOMY  2003    Current Medications:  Current Meds  Medication Sig  . [facility] CLOTRIMAZOLE 1 % cream APPLY 1 (ONE) APPLICATION TO BOTH FEET TWICE A DAY FOR 2-4 WEEKS  . ELIQUIS 5 MG TABS tablet TAKE 1 TABLET BY MOUTH TWICE A DAY  . furosemide (LASIX) 40 MG tablet Take 1 tablet (40 mg total) by mouth daily.  Marland Kitchen latanoprost (XALATAN) 0.005 % ophthalmic solution   . levothyroxine (SYNTHROID, LEVOTHROID) 175 MCG tablet Take 175 mcg by mouth daily before breakfast.  . losartan (COZAAR) 25 MG tablet Take 1 tablet (25 mg total) by mouth 2 (two) times daily.  . Multiple Vitamins-Minerals (ONE-A-DAY VITACRAVES IMMUNITY) CHEW Chew 2 tablets by mouth daily.  . Multiple Vitamins-Minerals (PRESERVISION AREDS 2 PO) Take 1 capsule by mouth 2 (two) times daily.  . pregabalin (LYRICA) 100 MG capsule Take 100 mg by mouth 2 (two) times daily.   Marland Kitchen zinc gluconate 50 MG tablet Take 50 mg by mouth daily.     Allergies:   Patient has no known allergies.   Social History   Socioeconomic History  . Marital status: Married    Spouse name: Not on file  . Number of children: Not on file  . Years of education: Not on file  . Highest education level: Not on file  Occupational History  . Not on file  Social Needs  . Financial resource strain:  Not on file  . Food insecurity    Worry: Not on file    Inability: Not on file  . Transportation needs    Medical: Not on file    Non-medical: Not on file  Tobacco Use  . Smoking status: Never Smoker  . Smokeless tobacco: Never Used  Substance and Sexual Activity  . Alcohol use: No  . Drug use: No  . Sexual activity: Not on file  Lifestyle  . Physical activity    Days per week: Not on file    Minutes per session: Not on file  . Stress: Not on file  Relationships  . Social Herbalist on phone: Not on file    Gets together: Not on file    Attends religious service: Not on file    Active member of club or organization: Not on file    Attends meetings of clubs or organizations: Not on file     Relationship status: Not on file  Other Topics Concern  . Not on file  Social History Narrative  . Not on file     Family History: The patient's family history is not on file. ROS:   Please see the history of present illness.    All 14 point review of systems negative except as described per history of present illness  EKGs/Labs/Other Studies Reviewed:      Recent Labs: 03/15/2019: ALT 26; B Natriuretic Peptide 1,522.3; Hemoglobin 14.2; Platelets 158 09/20/2019: BUN 22; Creatinine, Ser 0.92; NT-Pro BNP 3,376; Potassium 4.1; Sodium 141  Recent Lipid Panel No results found for: CHOL, TRIG, HDL, CHOLHDL, VLDL, LDLCALC, LDLDIRECT  Physical Exam:    VS:  BP 130/74   Pulse 76   Resp 10   Ht _0  (1.803 m)   Wt 150 lb 1.9 oz (68.1 kg)   BMI 20.94 kg/m     Wt Readings from Last 3 Encounters:  10/22/19 150 lb 1.9 oz (68.1 kg)  09/20/19 152 lb (68.9 kg)  09/14/19 159 lb (72.1 kg)     GEN:  Well nourished, well developed in no acute distress HEENT: Normal NECK: No JVD; No carotid bruits LYMPHATICS: No lymphadenopathy CARDIAC: RRR, no murmurs, no rubs, no gallops RESPIRATORY:  Clear to auscultation without rales, wheezing or rhonchi  ABDOMEN: Soft, non-tender, non-distended MUSCULOSKELETAL:  No edema; No deformity  SKIN: Warm and dry LOWER EXTREMITIES: no swelling NEUROLOGIC:  Alert and oriented x 3 PSYCHIATRIC:  Normal affect   ASSESSMENT:    1. Persistent atrial fibrillation ([place])   2. Dilated cardiomyopathy ([place])   3. Dyspnea on exertion    PLAN:    In order of problems listed above:  1. Persistent atrial fibrillation, anticoagulated which I will continue.  Plan as outlined above 2. Dilated cardiomyopathy.  We will switch from Cozaar to [facility], will also add small dose of beta-blocker. 3. Dyspnea on exertion improved significantly. 4. Meeting criteria for ICD and I initiated discussion about this.  He is not ready to make a decision but he is seems to be very  open for this option.  I bring him back to my office in about a month and will continue discussion   Medication Adjustments/Labs and Tests Ordered: Current medicines are reviewed at length with the patient today.  Concerns regarding medicines are outlined above.  No orders of the defined types were placed in this encounter.  Medication changes: No orders of the defined types were placed in this encounter.   Signed,  Park Liter, MD, [facility] 10/22/2019 2:17 PM    [facility]

## 2019-10-23 ENCOUNTER — Ambulatory Visit: Payer: Medicare Other | Admitting: Cardiology

## 2019-10-23 LAB — BASIC METABOLIC PANEL
BUN/Creatinine Ratio: 22 (ref 10–24)
BUN: 24 mg/dL (ref 8–27)
CO2: 23 mmol/L (ref 20–29)
Calcium: 10 mg/dL (ref 8.6–10.2)
Chloride: 102 mmol/L (ref 96–106)
Creatinine, Ser: 1.1 mg/dL (ref 0.76–1.27)
GFR calc Af Amer: 70 mL/min/{1.73_m2} (ref >59)
GFR calc non Af Amer: 61 mL/min/{1.73_m2} (ref >59)
Glucose: 89 mg/dL (ref 65–99)
Potassium: 4.2 mmol/L (ref 3.5–5.2)
Sodium: 139 mmol/L (ref 134–144)

## 2019-10-23 LAB — TSH: TSH: 7.7 u[IU]/mL — ABNORMAL HIGH (ref 0.450–4.500)

## 2019-10-25 ENCOUNTER — Telehealth: Payer: Self-pay | Admitting: Emergency Medicine

## 2019-10-25 DIAGNOSIS — I420 Dilated cardiomyopathy: Secondary | ICD-10-CM

## 2019-10-25 MED ORDER — ENTRESTO 49-51 MG PO TABS: 1.0000 | ORAL_TABLET | Freq: Two times a day (BID) | ORAL | 1 refills | Status: DC

## 2019-10-25 NOTE — Telephone Encounter (Signed)
Called patient and informed of lab results and per Dr. Agustin Cree to increase entresto to 49/51 mg twice daily. Patient also advised to come next week to have labs drawn. He verbally understands no further questions.

## 2019-10-29 ENCOUNTER — Other Ambulatory Visit: Payer: Medicare Other

## 2019-11-01 DIAGNOSIS — I420 Dilated cardiomyopathy: Secondary | ICD-10-CM | POA: Diagnosis not present

## 2019-11-02 LAB — BASIC METABOLIC PANEL
BUN/Creatinine Ratio: 26 — ABNORMAL HIGH (ref 10–24)
BUN: 31 mg/dL — ABNORMAL HIGH (ref 8–27)
CO2: 26 mmol/L (ref 20–29)
Calcium: 9.9 mg/dL (ref 8.6–10.2)
Chloride: 101 mmol/L (ref 96–106)
Creatinine, Ser: 1.2 mg/dL (ref 0.76–1.27)
GFR calc Af Amer: 63 mL/min/{1.73_m2} (ref >59)
GFR calc non Af Amer: 55 mL/min/{1.73_m2} — ABNORMAL LOW (ref >59)
Glucose: 94 mg/dL (ref 65–99)
Potassium: 4.1 mmol/L (ref 3.5–5.2)
Sodium: 142 mmol/L (ref 134–144)

## 2019-11-14 ENCOUNTER — Other Ambulatory Visit: Payer: Self-pay | Admitting: Cardiology

## 2019-11-22 ENCOUNTER — Ambulatory Visit (INDEPENDENT_AMBULATORY_CARE_PROVIDER_SITE_OTHER): Payer: Medicare Other | Admitting: Cardiology

## 2019-11-22 ENCOUNTER — Encounter: Payer: Self-pay | Admitting: Cardiology

## 2019-11-22 ENCOUNTER — Other Ambulatory Visit: Payer: Self-pay

## 2019-11-22 VITALS — BP 140/88 | HR 114 | Resp 85 | Ht 71.0 in | Wt 155.0 lb

## 2019-11-22 DIAGNOSIS — I4819 Other persistent atrial fibrillation: Secondary | ICD-10-CM | POA: Diagnosis not present

## 2019-11-22 DIAGNOSIS — I5043 Acute on chronic combined systolic (congestive) and diastolic (congestive) heart failure: Secondary | ICD-10-CM | POA: Diagnosis not present

## 2019-11-22 DIAGNOSIS — I420 Dilated cardiomyopathy: Secondary | ICD-10-CM | POA: Diagnosis not present

## 2019-11-22 MED ORDER — CARVEDILOL 6.25 MG PO TABS: 6.2500 mg | ORAL_TABLET | Freq: Two times a day (BID) | ORAL | 2 refills | Status: DC

## 2019-11-22 NOTE — Patient Instructions (Signed)
Medication Instructions:  Your physician has recommended you make the following change in your medication:   INCREASE: Carvedilol to 6.25 mg twice daily   *If you need a refill on your cardiac medications before your next appointment, please call your pharmacy*  Lab Work: Your physician recommends that you return for lab work today: bmp   If you have labs (blood work) drawn today and your tests are completely normal, you will receive your results only by: Marland Kitchen MyChart Message (if you have MyChart) OR . A paper copy in the mail If you have any lab test that is abnormal or we need to change your treatment, we will call you to review the results.  Testing/Procedures: None.   Follow-Up: At [facility], you and your health needs are our priority.  As part of our continuing mission to provide you with exceptional heart care, we have created designated Provider Care Teams.  These Care Teams include your primary Cardiologist (physician) and Advanced Practice Providers (APPs -  Physician Assistants and Nurse Practitioners) who all work together to provide you with the care you need, when you need it.  Your next appointment:   2 month(s)  The format for your next appointment:   In Person  Provider:   Jenne Campus, MD  Other Instructions

## 2019-11-22 NOTE — Progress Notes (Signed)
Cardiology Office Note:    Date:  11/22/2019   ID:  Curtis Ramos, DOB 11-28-1931, MRN [id number]  PCP:  Cyndi Bender, PA-C  Cardiologist:  Jenne Campus, MD    Referring MD: Cyndi Bender, PA-C   Chief Complaint  Patient presents with  . Follow-up    1 MO FU   Doing very well  History of Present Illness:    Curtis Ramos is a 86 y.o. male with past medical history significant for cardiomyopathy with ejection fraction 30%, gradual in 2014 the right medication which appears to be somewhat difficult but finally was getting better.  Also he does have what appears to be permanent atrial fibrillation.  He is anticoagulated with Eliquis.  Overall he is doing great he said he even cut some trees with a chainsaw just few days ago he said he have to work in order to feel better.  Denies have any shortness of breath no swelling of lower extremities no chest pain.  He stopped taking furosemide.  Past Medical History:  Diagnosis Date  . Arthritis    "proabably"  . Cancer [facility])    family unaware of this hx on 12/26/2014  . GERD (gastroesophageal reflux disease)   . Hypothyroidism   . Mycobacterium avium complex ([place])   . PONV (postoperative nausea and vomiting)   . Shingles   . Wears glasses     Past Surgical History:  Procedure Laterality Date  . APPENDECTOMY    . CATARACT EXTRACTION W/ INTRAOCULAR LENS  IMPLANT, BILATERAL Bilateral   . COLONOSCOPY    . INGUINAL HERNIA REPAIR Left   . MASS EXCISION Right 08/27/2014   Procedure: EXCISION MASS RIGHT PALM/INDEX;  Surgeon: Leanora Cover, MD;  Location: [place];  Service: Orthopedics;  Laterality: Right;  . THYROIDECTOMY  2003    Current Medications: Current Meds  Medication Sig  . carvedilol (COREG) 3.125 MG tablet TAKE 1 TABLET (3.125 MG TOTAL) BY MOUTH 2 (TWO) TIMES DAILY.  Marland Kitchen ELIQUIS 5 MG TABS tablet TAKE 1 TABLET BY MOUTH TWICE A DAY  . latanoprost (XALATAN) 0.005 % ophthalmic solution   . Multiple  Vitamins-Minerals (ONE-A-DAY VITACRAVES IMMUNITY) CHEW Chew 2 tablets by mouth daily.  . Multiple Vitamins-Minerals (PRESERVISION AREDS 2 PO) Take 1 capsule by mouth 2 (two) times daily.  . sacubitril-valsartan (ENTRESTO) 49-51 MG Take 1 tablet by mouth 2 (two) times daily.     Allergies:   Patient has no known allergies.   Social History   Socioeconomic History  . Marital status: Married    Spouse name: Not on file  . Number of children: Not on file  . Years of education: Not on file  . Highest education level: Not on file  Occupational History  . Not on file  Tobacco Use  . Smoking status: Never Smoker  . Smokeless tobacco: Never Used  Substance and Sexual Activity  . Alcohol use: No  . Drug use: No  . Sexual activity: Not on file  Other Topics Concern  . Not on file  Social History Narrative  . Not on file   Social Determinants of Health   Financial Resource Strain:   . Difficulty of Paying Living Expenses: Not on file  Food Insecurity:   . Worried About Charity fundraiser in the Last Year: Not on file  . Ran Out of Food in the Last Year: Not on file  Transportation Needs:   . Lack of Transportation (Medical): Not on file  .  Lack of Transportation (Non-Medical): Not on file  Physical Activity:   . Days of Exercise per Week: Not on file  . Minutes of Exercise per Session: Not on file  Stress:   . Feeling of Stress : Not on file  Social Connections:   . Frequency of Communication with Friends and Family: Not on file  . Frequency of Social Gatherings with Friends and Family: Not on file  . Attends Religious Services: Not on file  . Active Member of Clubs or Organizations: Not on file  . Attends Archivist Meetings: Not on file  . Marital Status: Not on file     Family History: The patient's family history is not on file. ROS:   Please see the history of present illness.    All 14 point review of systems negative except as described per history of  present illness  EKGs/Labs/Other Studies Reviewed:      Recent Labs: 03/15/2019: ALT 26; B Natriuretic Peptide 1,522.3; Hemoglobin 14.2; Platelets 158 09/20/2019: NT-Pro BNP 3,376 10/22/2019: TSH 7.700 11/01/2019: BUN 31; Creatinine, Ser 1.20; Potassium 4.1; Sodium 142  Recent Lipid Panel No results found for: CHOL, TRIG, HDL, CHOLHDL, VLDL, LDLCALC, LDLDIRECT  Physical Exam:    VS:  BP 140/88   Pulse (!) 114   Resp (!) 85   Ht _0  (1.803 m)   Wt 155 lb (70.3 kg)   BMI 21.62 kg/m     Wt Readings from Last 3 Encounters:  11/22/19 155 lb (70.3 kg)  10/22/19 150 lb 1.9 oz (68.1 kg)  09/20/19 152 lb (68.9 kg)     GEN:  Well nourished, well developed in no acute distress HEENT: Normal NECK: No JVD; No carotid bruits LYMPHATICS: No lymphadenopathy CARDIAC: RRR, no murmurs, no rubs, no gallops RESPIRATORY:  Clear to auscultation without rales, wheezing or rhonchi  ABDOMEN: Soft, non-tender, non-distended MUSCULOSKELETAL:  No edema; No deformity  SKIN: Warm and dry LOWER EXTREMITIES: no swelling NEUROLOGIC:  Alert and oriented x 3 PSYCHIATRIC:  Normal affect   ASSESSMENT:    1. Persistent atrial fibrillation ([place])   2. Dilated cardiomyopathy ([place])   3. Acute on chronic combined systolic and diastolic CHF (congestive heart failure) ([place])    PLAN:    In order of problems listed above:  1. Persistent atrial fibrillation now probably permanent.  He said he is doing well he does not want to do anything about it.  Therefore we will continue present management.  Continue anticoagulation. 2. Dilated cardiomyopathy on decent dose of Entresto, will do EKG today if EKG is fine will double the dose of Coreg.  I will check his Chem-7 if Chem-7 is fine I will start him on Aldactone.  We will continue this management for couple more months.  I am still unclear about etiology of this phenomenon we initiated conversation about defibrillator but he still is not sure about if he said Dr.  If I needed I will get it. 3. Congestive heart failure appears to be compensated, New York Heart Association class I/II   Medication Adjustments/Labs and Tests Ordered: Current medicines are reviewed at length with the patient today.  Concerns regarding medicines are outlined above.  No orders of the defined types were placed in this encounter.  Medication changes: No orders of the defined types were placed in this encounter.   Signed, Park Liter, MD, [facility] 11/22/2019 2:46 PM    [place]

## 2019-11-22 NOTE — Addendum Note (Signed)
Addended by: Ashok Norris on: 11/22/2019 03:17 PM   Modules accepted: Orders

## 2019-11-23 LAB — BASIC METABOLIC PANEL
BUN/Creatinine Ratio: 18 (ref 10–24)
BUN: 20 mg/dL (ref 8–27)
CO2: 23 mmol/L (ref 20–29)
Calcium: 9.8 mg/dL (ref 8.6–10.2)
Chloride: 104 mmol/L (ref 96–106)
Creatinine, Ser: 1.12 mg/dL (ref 0.76–1.27)
GFR calc Af Amer: 69 mL/min/{1.73_m2} (ref >59)
GFR calc non Af Amer: 60 mL/min/{1.73_m2} (ref >59)
Glucose: 86 mg/dL (ref 65–99)
Potassium: 3.9 mmol/L (ref 3.5–5.2)
Sodium: 140 mmol/L (ref 134–144)

## 2019-11-30 ENCOUNTER — Telehealth: Payer: Self-pay

## 2019-11-30 DIAGNOSIS — I5043 Acute on chronic combined systolic (congestive) and diastolic (congestive) heart failure: Secondary | ICD-10-CM

## 2019-11-30 MED ORDER — SPIRONOLACTONE 25 MG PO TABS: 12.5000 mg | ORAL_TABLET | Freq: Every day | ORAL | 1 refills | Status: DC

## 2019-11-30 NOTE — Telephone Encounter (Signed)
-----   Message from Park Liter, MD sent at 11/29/2019  7:24 PM EST ----- Add Aldactone 12.5 mg daily, Chem-7 1 week

## 2019-11-30 NOTE — Telephone Encounter (Signed)
Spoke with pt, advised of lab results and MD recommendation to start Spironolactone 1/2 tab daily.  Rx sent to pharmacy, lab orders entered.  Pt v/u of new med and labwork.

## 2019-12-08 ENCOUNTER — Other Ambulatory Visit: Payer: Self-pay | Admitting: Cardiology

## 2019-12-10 ENCOUNTER — Telehealth: Payer: Self-pay

## 2019-12-10 DIAGNOSIS — I420 Dilated cardiomyopathy: Secondary | ICD-10-CM | POA: Diagnosis not present

## 2019-12-10 NOTE — Telephone Encounter (Signed)
Patient states since he increased carvedilol his circulation in his fingers have decreased. Patients nails are blueish, cold and nailbeds are cyanotic. Patient starts he started meds on Saturday and it hurts to bend his hands. NO edema, SOB, chest pain or any other cardiac symptoms. Note routed to Dr. Raliegh Ip and Angie Fava for response.

## 2019-12-11 LAB — BASIC METABOLIC PANEL
BUN/Creatinine Ratio: 17 (ref 10–24)
BUN: 20 mg/dL (ref 8–27)
CO2: 24 mmol/L (ref 20–29)
Calcium: 10 mg/dL (ref 8.6–10.2)
Chloride: 100 mmol/L (ref 96–106)
Creatinine, Ser: 1.21 mg/dL (ref 0.76–1.27)
GFR calc Af Amer: 63 mL/min/{1.73_m2} (ref >59)
GFR calc non Af Amer: 54 mL/min/{1.73_m2} — ABNORMAL LOW (ref >59)
Glucose: 87 mg/dL (ref 65–99)
Potassium: 4.4 mmol/L (ref 3.5–5.2)
Sodium: 138 mmol/L (ref 134–144)

## 2019-12-11 MED ORDER — CARVEDILOL 6.25 MG PO TABS: 3.1250 mg | ORAL_TABLET | Freq: Two times a day (BID) | ORAL | 2 refills | Status: DC

## 2019-12-11 NOTE — Telephone Encounter (Signed)
Reduce the dose of carvedilol to the previous dose please

## 2019-12-11 NOTE — Telephone Encounter (Signed)
Called to relay information, no answer. Will attempt to call back in the morning

## 2019-12-14 NOTE — Telephone Encounter (Signed)
Called patient and left detailed vm (per DPR) with Dr. Marthann Schiller recommendation for his previous dose of Carvedilol. I told him to contact the office if he still had questions.

## 2019-12-20 ENCOUNTER — Other Ambulatory Visit: Payer: Self-pay

## 2019-12-20 MED ORDER — APIXABAN 5 MG PO TABS: 5.0000 mg | ORAL_TABLET | Freq: Two times a day (BID) | ORAL | 3 refills | Status: DC

## 2019-12-20 NOTE — Progress Notes (Signed)
[facility] Eliquis 5 MG. Sent refill to pharmacy

## 2020-01-10 DIAGNOSIS — J309 Allergic rhinitis, unspecified: Secondary | ICD-10-CM | POA: Diagnosis not present

## 2020-01-10 DIAGNOSIS — Z6823 Body mass index (BMI) 23.0-23.9, adult: Secondary | ICD-10-CM | POA: Diagnosis not present

## 2020-01-24 ENCOUNTER — Other Ambulatory Visit: Payer: Self-pay

## 2020-01-24 ENCOUNTER — Ambulatory Visit: Payer: Medicare Other | Admitting: Cardiology

## 2020-01-24 ENCOUNTER — Encounter: Payer: Self-pay | Admitting: Cardiology

## 2020-01-24 VITALS — BP 116/70 | HR 80 | Ht 71.0 in | Wt 166.2 lb

## 2020-01-24 DIAGNOSIS — R0600 Dyspnea, unspecified: Secondary | ICD-10-CM | POA: Diagnosis not present

## 2020-01-24 DIAGNOSIS — I420 Dilated cardiomyopathy: Secondary | ICD-10-CM | POA: Diagnosis not present

## 2020-01-24 DIAGNOSIS — I4819 Other persistent atrial fibrillation: Secondary | ICD-10-CM

## 2020-01-24 DIAGNOSIS — R0609 Other forms of dyspnea: Secondary | ICD-10-CM

## 2020-01-24 NOTE — Progress Notes (Signed)
Cardiology Office Note:    Date:  01/24/2020   ID:  Curtis Ramos, DOB [birth year], MRN [id number]  PCP:  Cyndi Bender, PA-C  Cardiologist:  Jenne Campus, MD    Referring MD: Cyndi Bender, PA-C   No chief complaint on file. The entire  History of Present Illness:    Curtis Ramos is a 86 y.o. male the past medical history is significant for cardiomyopathy, diagnosed months ago with ejection fraction 25 to 30%, also atrial fibrillation, gradual in 20 put him on right medication which is somewhat difficult.  Repeated echocardiogram however showed only mild improvement in left ventricle ejection fraction.  Still severely diminished.  We had a long discussion today with him as well as to one of his son we will talk about options for the situation.  Previously I initiated conversation about defibrillator.  We will continue this conversation today.  We will also talk about potentially evaluating him for coronary artery disease.  I told him about different techniques that we can use to do that including stress testing versus cardiac catheterization.  He is not able to make a decision he said he want to think more about that. Luckily, clinically he seems to be doing well still works of easy doing it.  Denies have any palpitations, no tightness no pressure no squeezing no burning in the chest.  Past Medical History:  Diagnosis Date  . Arthritis    "proabably"  . Cancer [facility])    family unaware of this hx on 12/26/2014  . GERD (gastroesophageal reflux disease)   . Hypothyroidism   . Mycobacterium avium complex ([place])   . PONV (postoperative nausea and vomiting)   . Shingles   . Wears glasses     Past Surgical History:  Procedure Laterality Date  . APPENDECTOMY    . CATARACT EXTRACTION W/ INTRAOCULAR LENS  IMPLANT, BILATERAL Bilateral   . COLONOSCOPY    . INGUINAL HERNIA REPAIR Left   . MASS EXCISION Right 08/27/2014   Procedure: EXCISION MASS RIGHT PALM/INDEX;  Surgeon: Leanora Cover,  MD;  Location: [place];  Service: Orthopedics;  Laterality: Right;  . THYROIDECTOMY  2003    Current Medications: Current Meds  Medication Sig  . apixaban (ELIQUIS) 5 MG TABS tablet Take 1 tablet (5 mg total) by mouth 2 (two) times daily.  . carvedilol (COREG) 6.25 MG tablet Take 0.5 tablets (3.125 mg total) by mouth 2 (two) times daily.  . [facility] CLOTRIMAZOLE 1 % cream APPLY 1 (ONE) APPLICATION TO BOTH FEET TWICE A DAY FOR 2-4 WEEKS  . ENTRESTO 49-51 MG TAKE 1 TABLET BY MOUTH TWICE A DAY  . furosemide (LASIX) 40 MG tablet Take 1 tablet (40 mg total) by mouth daily.  Marland Kitchen latanoprost (XALATAN) 0.005 % ophthalmic solution   . levothyroxine (SYNTHROID, LEVOTHROID) 175 MCG tablet Take 175 mcg by mouth daily before breakfast.  . Multiple Vitamins-Minerals (ONE-A-DAY VITACRAVES IMMUNITY) CHEW Chew 2 tablets by mouth daily.  . Multiple Vitamins-Minerals (PRESERVISION AREDS 2 PO) Take 1 capsule by mouth 2 (two) times daily.  . pregabalin (LYRICA) 100 MG capsule Take 100 mg by mouth 2 (two) times daily.   Marland Kitchen spironolactone (ALDACTONE) 25 MG tablet Take 0.5 tablets (12.5 mg total) by mouth daily.  Marland Kitchen zinc gluconate 50 MG tablet Take 50 mg by mouth daily.     Allergies:   Patient has no known allergies.   Social History   Socioeconomic History  . Marital status: Married  Spouse name: Not on file  . Number of children: Not on file  . Years of education: Not on file  . Highest education level: Not on file  Occupational History  . Not on file  Tobacco Use  . Smoking status: Never Smoker  . Smokeless tobacco: Never Used  Substance and Sexual Activity  . Alcohol use: No  . Drug use: No  . Sexual activity: Not on file  Other Topics Concern  . Not on file  Social History Narrative  . Not on file   Social Determinants of Health   Financial Resource Strain:   . Difficulty of Paying Living Expenses:   Food Insecurity:   . Worried About Charity fundraiser in the Last Year:     . Arboriculturist in the Last Year:   Transportation Needs:   . Film/video editor (Medical):   Marland Kitchen Lack of Transportation (Non-Medical):   Physical Activity:   . Days of Exercise per Week:   . Minutes of Exercise per Session:   Stress:   . Feeling of Stress :   Social Connections:   . Frequency of Communication with Friends and Family:   . Frequency of Social Gatherings with Friends and Family:   . Attends Religious Services:   . Active Member of Clubs or Organizations:   . Attends Archivist Meetings:   Marland Kitchen Marital Status:      Family History: The patient's family history is not on file. ROS:   Please see the history of present illness.    All 14 point review of systems negative except as described per history of present illness  EKGs/Labs/Other Studies Reviewed:      Recent Labs: 03/15/2019: ALT 26; B Natriuretic Peptide 1,522.3; Hemoglobin 14.2; Platelets 158 09/20/2019: NT-Pro BNP 3,376 10/22/2019: TSH 7.700 12/10/2019: BUN 20; Creatinine, Ser 1.21; Potassium 4.4; Sodium 138  Recent Lipid Panel No results found for: CHOL, TRIG, HDL, CHOLHDL, VLDL, LDLCALC, LDLDIRECT  Physical Exam:    VS:  BP 116/70   Pulse 80   Ht _0  (1.803 m)   Wt 166 lb 3.2 oz (75.4 kg)   SpO2 98%   BMI 23.18 kg/m     Wt Readings from Last 3 Encounters:  01/24/20 166 lb 3.2 oz (75.4 kg)  11/22/19 155 lb (70.3 kg)  10/22/19 150 lb 1.9 oz (68.1 kg)     GEN:  Well nourished, well developed in no acute distress HEENT: Normal NECK: No JVD; No carotid bruits LYMPHATICS: No lymphadenopathy CARDIAC: Irregularly irregular, no murmurs, no rubs, no gallops RESPIRATORY:  Clear to auscultation without rales, wheezing or rhonchi  ABDOMEN: Soft, non-tender, non-distended MUSCULOSKELETAL:  No edema; No deformity  SKIN: Warm and dry LOWER EXTREMITIES: no swelling NEUROLOGIC:  Alert and oriented x 3 PSYCHIATRIC:  Normal affect   ASSESSMENT:    1. Dilated cardiomyopathy (HCC)   2.  Persistent atrial fibrillation (H[facility])   3. Dyspnea on exertion    PLAN:    In order of problems listed above:  1. Dilated cardiomyopathy on Entresto, Aldactone, beta-blocker which I will continue.  His blood pressure slightly on the lower side.  Again we continue conversation about evaluating for coronary artery disease as potential reason for his cardiomyopathy.  He said he will think it over.  We also talk about potentially defibrillator implantation again he said he would like to think it over. 2. Persistent atrial fibrillation, rate controlled, anticoagulated, will continue. 3. Dyspnea on exertion much  improved and overall clinically he is doing well.  Also difficult situation after multiple lengthy conversation about potentially CAD evaluation as well as defibrillator implantation.  He is still reluctant, not convinced what he wants to do.   Medication Adjustments/Labs and Tests Ordered: Current medicines are reviewed at length with the patient today.  Concerns regarding medicines are outlined above.  No orders of the defined types were placed in this encounter.  Medication changes: No orders of the defined types were placed in this encounter.   Signed, Park Liter, MD, [facility] 01/24/2020 2:58 PM    [place]

## 2020-01-24 NOTE — Patient Instructions (Signed)

## 2020-02-01 ENCOUNTER — Telehealth: Payer: Self-pay | Admitting: Cardiology

## 2020-02-01 NOTE — Telephone Encounter (Signed)
Patient's son, [name], is calling in to get an estimated date range that his father will be having his heart cath done. He is trying to line things up with other family members considering the patients wife is not doing to well. Please advise.

## 2020-02-01 NOTE — Telephone Encounter (Signed)
Called and spoke with patient's son, [name], who states he was at the office visit last week with his father. He states patient is ready to proceed with cardiac cath and wanted to know how far in advance the cath needs to be scheduled so that they can make arrangements with family for care of the patient and his wife. I advised him that we can check the cath schedule for anytime the patient and family request. I advised that we prefer not to schedule non-urgent caths for Mondays. I explained the need for Covid screening and isolation prior to cath. [name] verbalized understanding and states he will call back Monday to request a date. He thanked me for my help.

## 2020-02-04 NOTE — Telephone Encounter (Signed)
Left message for patient to return call.

## 2020-02-04 NOTE — Telephone Encounter (Signed)
Called patient back. Scheduled him for appointment with Dr. Harriet Masson this week so he cant get chest xray, ekg, and labs before cath.   Will call patients son with all information below tomorrow.    [place] OFFICE [place], [place] [address] Dept: [phone] Loc: [place]  02/04/2020  You are scheduled for a Cardiac Catheterization on Friday, March 26 with Dr. Peter Martinique.  1. Please arrive at the [facility] (Main Entrance A) at [facility]: [address] [place], [place] [address] at 7:00 AM (This time is two hours before your procedure to ensure your preparation). Free valet parking service is available.   Special note: Every effort is made to have your procedure done on time. Please understand that emergencies sometimes delay scheduled procedures.  2. Diet: Do not eat solid foods after midnight.  The patient may have clear liquids until 5am upon the day of the procedure.  3. Labs: You will need to have blood drawn at appointment with Dr. Harriet Masson.   *You will need to have a chest xray prior to your procedure.   4. Medication instructions in preparation for your procedure:     Stop taking Eliquis (Apixiban) on Wednesday, March 24.   HOLD Lasix and spironolactone the day of the procedure.       On the morning of your procedure, take your Aspirin and any morning medicines NOT listed above.  You may use sips of water.  5. Plan for one night stay--bring personal belongings. 6. Bring a current list of your medications and current insurance cards. 7. You MUST have a responsible person to drive you home. 8. Someone MUST be with you the first 24 hours after you arrive home or your discharge will be delayed. 9. Please wear clothes that are easy to get on and off and wear slip-on shoes.  Thank you for allowing [place] to care for you!   -- [place] [facility]

## 2020-02-04 NOTE — Telephone Encounter (Signed)
Follow Up:      Returning your call from today. 

## 2020-02-04 NOTE — Telephone Encounter (Signed)
We can start working on getting him scheduled for cardiac catheterization

## 2020-02-05 ENCOUNTER — Ambulatory Visit: Payer: Medicare Other | Admitting: Cardiology

## 2020-02-05 ENCOUNTER — Other Ambulatory Visit (HOSPITAL_COMMUNITY)
Admission: RE | Admit: 2020-02-05 | Discharge: 2020-02-05 | Disposition: A | Discharge status: Home / Self Care | Payer: Medicare Other | Source: Ambulatory Visit | Attending: Cardiology | Admitting: Cardiology

## 2020-02-05 ENCOUNTER — Other Ambulatory Visit: Payer: Self-pay

## 2020-02-05 ENCOUNTER — Encounter: Payer: Self-pay | Admitting: Cardiology

## 2020-02-05 ENCOUNTER — Telehealth: Payer: Self-pay | Admitting: Cardiology

## 2020-02-05 VITALS — BP 126/70 | HR 102 | Ht 71.0 in | Wt 168.0 lb

## 2020-02-05 DIAGNOSIS — U071 COVID-19: Secondary | ICD-10-CM | POA: Insufficient documentation

## 2020-02-05 DIAGNOSIS — R0989 Other specified symptoms and signs involving the circulatory and respiratory systems: Secondary | ICD-10-CM | POA: Diagnosis not present

## 2020-02-05 DIAGNOSIS — I10 Essential (primary) hypertension: Secondary | ICD-10-CM | POA: Insufficient documentation

## 2020-02-05 DIAGNOSIS — Z01812 Encounter for preprocedural laboratory examination: Secondary | ICD-10-CM | POA: Insufficient documentation

## 2020-02-05 DIAGNOSIS — R931 Abnormal findings on diagnostic imaging of heart and coronary circulation: Secondary | ICD-10-CM

## 2020-02-05 DIAGNOSIS — I480 Paroxysmal atrial fibrillation: Secondary | ICD-10-CM

## 2020-02-05 DIAGNOSIS — Z01810 Encounter for preprocedural cardiovascular examination: Secondary | ICD-10-CM

## 2020-02-05 DIAGNOSIS — R001 Bradycardia, unspecified: Secondary | ICD-10-CM | POA: Diagnosis not present

## 2020-02-05 HISTORY — DX: Other specified symptoms and signs involving the circulatory and respiratory systems: R09.89

## 2020-02-05 HISTORY — DX: Paroxysmal atrial fibrillation: I48.0

## 2020-02-05 HISTORY — DX: Abnormal findings on diagnostic imaging of heart and coronary circulation: R93.1

## 2020-02-05 HISTORY — DX: Essential (primary) hypertension: I10

## 2020-02-05 LAB — SARS CORONAVIRUS 2 (TAT 6-24 HRS): SARS Coronavirus 2: POSITIVE — AB

## 2020-02-05 NOTE — Patient Instructions (Addendum)
Medication Instructions:  Your physician recommends that you continue on your current medications as directed. Please refer to the Current Medication list given to you today.  *If you need a refill on your cardiac medications before your next appointment, please call your pharmacy*   Lab Work: Bmp & Cbc - Today   If you have labs (blood work) drawn today and your tests are completely normal, you will receive your results only by: Marland Kitchen MyChart Message (if you have MyChart) OR . A paper copy in the mail If you have any lab test that is abnormal or we need to change your treatment, we will call you to review the results.   Testing/Procedures: Your physician has requested that you have a cardiac catheterization. Cardiac catheterization is used to diagnose and/or treat various heart conditions. Doctors may recommend this procedure for a number of different reasons. The most common reason is to evaluate chest pain. Chest pain can be a symptom of coronary artery disease (CAD), and cardiac catheterization can show whether plaque is narrowing or blocking your heart's arteries. This procedure is also used to evaluate the valves, as well as measure the blood flow and oxygen levels in different parts of your heart. For further information please visit HugeFiesta.tn. Please follow instruction sheet, as given.     Follow-Up: You are scheduled to see Jenne Campus, MD on 05/10/2020 @ 3:35 PM   Other Instructions  Called patient back. Scheduled him for appointment with Dr. Harriet Masson this week so he cant get chest xray, ekg, and labs before cath.   [place] OFFICE [place], [place] [address] Dept: [phone] Loc: [place]                        02/04/2020  You are scheduled for a Cardiac Catheterization on Friday, March 26 with Dr. Peter Martinique.  1. Please arrive at the  [facility] (Main Entrance A) at [facility]: [address] [place], [place] [address] at 7:00 AM (This time is two hours before your procedure to ensure your preparation). Free valet parking service is available.   Special note: Every effort is made to have your procedure done on time. Please understand that emergencies sometimes delay scheduled procedures.  2. Diet: Do not eat solid foods after midnight.  The patient may have clear liquids until 5am upon the day of the procedure.  3. Labs: You will need to have blood drawn at appointment with Dr. Harriet Masson.   *You will need to have a chest xray prior to your procedure.   4. Medication instructions in preparation for your procedure:    Stop taking Eliquis (Apixiban) on Wednesday, March 24.   HOLD Lasix and spironolactone the day of the procedure.    On the morning of your procedure, take your Aspirin and any morning medicines NOT listed above.  You may use sips of water.  5. Plan for one night stay--bring personal belongings. 6. Bring a current list of your medications and current insurance cards. 7. You MUST have a responsible person to drive you home. 8. Someone MUST be with you the first 24 hours after you arrive home or your discharge will be delayed. 9. Please wear clothes that are easy to get on and off and wear slip-on shoes.  Thank you for allowing [place] to care for you!   -- [facility]  Invasive Cardiovascular services

## 2020-02-05 NOTE — Progress Notes (Signed)
Cardiology Office Note:    Date:  02/05/2020   ID:  Curtis Ramos, DOB 1932-02-16, MRN [id number]  PCP:  Cyndi Bender, PA-C  Cardiologist:  No primary care provider on file.  Electrophysiologist:  None   Referring MD: Cyndi Bender, PA-C   Chief Complaint  Patient presents with  . Follow-up    History of Present Illness:    Curtis Ramos is a 86 y.o. male with a hx of history of significant cardiomyopathy, diagnosed months ago with ejection fraction 25 to 30%, atrial fibrillation.  The patient follows with Dr. Agustin Cree and has previously discussed left heart catheterization due to his depressed ejection fraction.  He is here today for EKG check.   Past Medical History:  Diagnosis Date  . Arthritis    "proabably"  . Cancer [facility])    family unaware of this hx on 12/26/2014  . GERD (gastroesophageal reflux disease)   . Hypothyroidism   . Mycobacterium avium complex ([place])   . PONV (postoperative nausea and vomiting)   . Shingles   . Wears glasses     Past Surgical History:  Procedure Laterality Date  . APPENDECTOMY    . CATARACT EXTRACTION W/ INTRAOCULAR LENS  IMPLANT, BILATERAL Bilateral   . COLONOSCOPY    . INGUINAL HERNIA REPAIR Left   . MASS EXCISION Right 08/27/2014   Procedure: EXCISION MASS RIGHT PALM/INDEX;  Surgeon: Leanora Cover, MD;  Location: [facility];  Service: Orthopedics;  Laterality: Right;  . THYROIDECTOMY  2003    Current Medications: Current Meds  Medication Sig  . apixaban (ELIQUIS) 5 MG TABS tablet Take 1 tablet (5 mg total) by mouth 2 (two) times daily.  . carvedilol (COREG) 6.25 MG tablet Take 0.5 tablets (3.125 mg total) by mouth 2 (two) times daily.  . [employer]S CLOTRIMAZOLE 1 % cream APPLY 1 (ONE) APPLICATION TO BOTH FEET TWICE A DAY FOR 2-4 WEEKS  . ENTRESTO 49-51 MG TAKE 1 TABLET BY MOUTH TWICE A DAY  . furosemide (LASIX) 40 MG tablet Take 1 tablet (40 mg total) by mouth daily.  Marland Kitchen latanoprost (XALATAN) 0.005 % ophthalmic solution    . levothyroxine (SYNTHROID, LEVOTHROID) 175 MCG tablet Take 175 mcg by mouth daily before breakfast.  . Multiple Vitamins-Minerals (ONE-A-DAY VITACRAVES IMMUNITY) CHEW Chew 2 tablets by mouth daily.  . Multiple Vitamins-Minerals (PRESERVISION AREDS 2 PO) Take 1 capsule by mouth 2 (two) times daily.  . pregabalin (LYRICA) 100 MG capsule Take 100 mg by mouth 2 (two) times daily.   Marland Kitchen spironolactone (ALDACTONE) 25 MG tablet Take 0.5 tablets (12.5 mg total) by mouth daily.  Marland Kitchen zinc gluconate 50 MG tablet Take 50 mg by mouth daily.     Allergies:   Patient has no known allergies.   Social History   Socioeconomic History  . Marital status: Married    Spouse name: Not on file  . Number of children: Not on file  . Years of education: Not on file  . Highest education level: Not on file  Occupational History  . Not on file  Tobacco Use  . Smoking status: Never Smoker  . Smokeless tobacco: Never Used  Substance and Sexual Activity  . Alcohol use: No  . Drug use: No  . Sexual activity: Not on file  Other Topics Concern  . Not on file  Social History Narrative  . Not on file   Social Determinants of Health   Financial Resource Strain:   . Difficulty of Paying Living Expenses:  Food Insecurity:   . Worried About Charity fundraiser in the Last Year:   . Arboriculturist in the Last Year:   Transportation Needs:   . Film/video editor (Medical):   Marland Kitchen Lack of Transportation (Non-Medical):   Physical Activity:   . Days of Exercise per Week:   . Minutes of Exercise per Session:   Stress:   . Feeling of Stress :   Social Connections:   . Frequency of Communication with Friends and Family:   . Frequency of Social Gatherings with Friends and Family:   . Attends Religious Services:   . Active Member of Clubs or Organizations:   . Attends Archivist Meetings:   Marland Kitchen Marital Status:      Family History: The patient's family history is not on file.  ROS:   Review of  Systems  Constitution: Negative for decreased appetite, fever and weight gain.  HENT: Negative for congestion, ear discharge, hoarse voice and sore throat.   Eyes: Negative for discharge, redness, vision loss in right eye and visual halos.  Cardiovascular: Negative for chest pain, dyspnea on exertion, leg swelling, orthopnea and palpitations.  Respiratory: Negative for cough, hemoptysis, shortness of breath and snoring.   Endocrine: Negative for heat intolerance and polyphagia.  Hematologic/Lymphatic: Negative for bleeding problem. Does not bruise/bleed easily.  Skin: Negative for flushing, nail changes, rash and suspicious lesions.  Musculoskeletal: Negative for arthritis, joint pain, muscle cramps, myalgias, neck pain and stiffness.  Gastrointestinal: Negative for abdominal pain, bowel incontinence, diarrhea and excessive appetite.  Genitourinary: Negative for decreased libido, genital sores and incomplete emptying.  Neurological: Negative for brief paralysis, focal weakness, headaches and loss of balance.  Psychiatric/Behavioral: Negative for altered mental status, depression and suicidal ideas.  Allergic/Immunologic: Negative for HIV exposure and persistent infections.    EKGs/Labs/Other Studies Reviewed:    The following studies were reviewed today:   EKG:  The ekg ordered today demonstrates sinus rhythm, heart rate 80 bpm first-degree AV block with left-sided interventricular conduction defect and poor wave progression which should be suggestive of old septal infarction, compared to 11/22/19 the at which time the patient was in atrial fibrillation.   Transthoracic echocardiogram September 23, 2020 IMPRESSIONS 1. Left ventricular ejection fraction, by visual estimation, is 25 to  30%. The left ventricle has severely decreased function. The Left  ventricular cavity is mildly dilated with global hypokinesis. Left  ventricular septal wall thickness was moderately  increased. Moderately  increased left ventricular posterior wall thickness.  There is moderately increased left ventricular hypertrophy.  2. Left ventricular diastolic parameters are consistent with Grade II  diastolic dysfunction (pseudonormalization).  3. Global right ventricle has moderately reduced systolic function.The  right ventricular size is moderately enlarged. No increase in right  ventricular wall thickness.  4. Left atrial size was severely dilated.  5. Right atrial size was severely dilated.  6. The mitral valve is normal in structure. Mild to moderate mitral valve  regurgitation. No evidence of mitral stenosis.  7. The tricuspid valve is normal in structure. Tricuspid valve  regurgitation is mild.  8. The aortic valve is normal in structure. Aortic valve regurgitation is  mild. No evidence of aortic valve sclerosis or stenosis.  9. The pulmonic valve was normal in structure. Pulmonic valve  regurgitation is not visualized.  10. The proximal ascending aorta diameter is at the upper limit of normal.  11. Normal pulmonary artery systolic pressure.  12. No prior TTE for comparison.  FINDINGS  Left Ventricle: Left ventricular ejection fraction, by visual estimation,  is 25 to 30%. The left ventricle has severely decreased function. The left  ventricle demonstrates global hypokinesis. The left ventricular internal  cavity size was mildly dilated  left ventricle. Moderately increased left ventricular posterior wall  thickness. There is moderately increased left ventricular hypertrophy.  Left ventricular diastolic parameters are consistent with Grade II  diastolic dysfunction (pseudonormalization).  Normal left atrial pressure.   Right Ventricle: The right ventricular size is moderately enlarged. No  increase in right ventricular wall thickness. Global RV systolic function  is has moderately reduced systolic function. The tricuspid regurgitant  velocity is 2.43 m/s, and with an  assumed  right atrial pressure of 3 mmHg, the estimated right ventricular  systolic pressure is normal at 26.7 mmHg.   Left Atrium: Left atrial size was severely dilated.   Right Atrium: Right atrial size was severely dilated   Pericardium: There is no evidence of pericardial effusion.   Mitral Valve: The mitral valve is normal in structure. No evidence of  mitral valve stenosis by observation. Mild to moderate mitral valve  regurgitation.   Tricuspid Valve: The tricuspid valve is normal in structure. Tricuspid  valve regurgitation is mild.   Aortic Valve: The aortic valve is normal in structure. Aortic valve  regurgitation is mild. Aortic regurgitation PHT measures 524 msec. The  aortic valve is structurally normal, with no evidence of sclerosis or  stenosis.   Pulmonic Valve: The pulmonic valve was normal in structure. Pulmonic valve  regurgitation is not visualized.   Aorta: The aortic root, ascending aorta and aortic arch are all  structurally normal, with no evidence of dilitation or obstruction. The  proximal ascending aorta diameter is at the upper limit of normal.   Venous: The inferior vena cava is normal in size with greater than 50%  respiratory variability, suggesting right atrial pressure of 3 mmHg.   IAS/Shunts: No atrial level shunt detected by color flow Doppler. No  ventricular septal defect is seen or detected. There is no evidence of an  atrial septal defect.   Recent Labs: 03/15/2019: ALT 26; B Natriuretic Peptide 1,522.3; Hemoglobin 14.2; Platelets 158 09/20/2019: NT-Pro BNP 3,376 10/22/2019: TSH 7.700 12/10/2019: BUN 20; Creatinine, Ser 1.21; Potassium 4.4; Sodium 138  Recent Lipid Panel No results found for: CHOL, TRIG, HDL, CHOLHDL, VLDL, LDLCALC, LDLDIRECT  Physical Exam:    VS:  BP 126/70 (BP Location: Left Arm, Patient Position: Sitting, Cuff Size: Normal)   Pulse (!) 102   Ht _0  (1.803 m)   Wt 168 lb (76.2 kg)   SpO2 90%   BMI 23.43 kg/m     Wt  Readings from Last 3 Encounters:  02/05/20 168 lb (76.2 kg)  01/24/20 166 lb 3.2 oz (75.4 kg)  11/22/19 155 lb (70.3 kg)     GEN: Well nourished, well developed in no acute distress HEENT: Normal NECK: No JVD; No carotid bruits LYMPHATICS: No lymphadenopathy CARDIAC: S1S2 noted,RRR, no murmurs, rubs, gallops RESPIRATORY:  Clear to auscultation without rales, wheezing or rhonchi  ABDOMEN: Soft, non-tender, non-distended, +bowel sounds, no guarding. EXTREMITIES: No edema, No cyanosis, no clubbing MUSCULOSKELETAL:  No deformity  SKIN: Warm and dry NEUROLOGIC:  Alert and oriented x 3, non-focal PSYCHIATRIC:  Normal affect, good insight  ASSESSMENT:    1. Depressed left ventricular ejection fraction   2. PAF (paroxysmal atrial fibrillation) ([place])   3. Essential hypertension    PLAN:     The  patient had previously planned LHC schedule which he discussed with Dr. Joycelyn Rua his. His question for the procedure were answered.  The patient understands that risks include but are not limited to stroke (1 in 1000), death (1 in 4), kidney failure [usually temporary] (1 in 500), bleeding (1 in 200), allergic reaction [possibly serious] (1 in 200), and agrees to proceed.  HTN- blood pressure acceptable today, no changes in his medication.   Blood work for bmp and magnesium.  PAF - he is in sinus rhythm, he has been advised to hold Eliquis 2 days prior to his procedure.   The patient is in agreement with the above plan. The patient left the office in stable condition.  The patient will follow up with Dr Agustin Cree as scheduled.    Medication Adjustments/Labs and Tests Ordered: Current medicines are reviewed at length with the patient today.  Concerns regarding medicines are outlined above.  No orders of the defined types were placed in this encounter.  No orders of the defined types were placed in this encounter.   Patient Instructions  Medication Instructions:  Your physician  recommends that you continue on your current medications as directed. Please refer to the Current Medication list given to you today.  *If you need a refill on your cardiac medications before your next appointment, please call your pharmacy*   Lab Work: Bmp & Cbc - Today   If you have labs (blood work) drawn today and your tests are completely normal, you will receive your results only by: Marland Kitchen MyChart Message (if you have MyChart) OR . A paper copy in the mail If you have any lab test that is abnormal or we need to change your treatment, we will call you to review the results.   Testing/Procedures: Your physician has requested that you have a cardiac catheterization. Cardiac catheterization is used to diagnose and/or treat various heart conditions. Doctors may recommend this procedure for a number of different reasons. The most common reason is to evaluate chest pain. Chest pain can be a symptom of coronary artery disease (CAD), and cardiac catheterization can show whether plaque is narrowing or blocking your heart's arteries. This procedure is also used to evaluate the valves, as well as measure the blood flow and oxygen levels in different parts of your heart. For further information please visit HugeFiesta.tn. Please follow instruction sheet, as given.     Follow-Up: You are scheduled to see Jenne Campus, MD on 05/10/2020 @ 3:35 PM  Other [place] OFFICE [place], [place] [address] Dept: [phone] Loc: [place]                        02/04/2020  You are scheduled for a Cardiac Catheterization on Friday, March 26 with Dr. Peter Martinique.  1. Please arrive at the [facility] (Main Entrance A) at [facility]: [address] [place], [place] [address] at 7:00 AM (This time is two hours before your procedure to ensure your preparation).  Free valet parking service is available.   Special note: Every effort is made to have your procedure done on time. Please understand that emergencies sometimes delay scheduled procedures.  2. Diet: Do not eat solid foods after midnight.  The patient may have clear liquids until 5am upon the day of the procedure.  3. Labs: You will need to have blood drawn  at appointment with Dr. Harriet Masson.   *You will need to have a chest xray prior to your procedure.   4. Medication instructions in preparation for your procedure:    Stop taking Eliquis (Apixiban) on Wednesday, March 24.   HOLD Lasix and spironolactone the day of the procedure.    On the morning of your procedure, take your Aspirin and any morning medicines NOT listed above.  You may use sips of water.  5. Plan for one night stay--bring personal belongings. 6. Bring a current list of your medications and current insurance cards. 7. You MUST have a responsible person to drive you home. 8. Someone MUST be with you the first 24 hours after you arrive home or your discharge will be delayed. 9. Please wear clothes that are easy to get on and off and wear slip-on shoes.  Thank you for allowing [place] to care for you!   -- [place] [facility]     Adopting a Healthy Lifestyle.  Know what a healthy weight is for you (roughly BMI <25) and aim to maintain this   Aim for 7+ servings of fruits and vegetables daily   65-80+ fluid ounces of water or unsweet tea for healthy kidneys   Limit to max 1 drink of alcohol per day; avoid smoking/tobacco   Limit animal fats in diet for cholesterol and heart health - choose grass fed whenever available   Avoid highly processed foods, and foods high in saturated/trans fats   Aim for low stress - take time to unwind and care for your mental health   Aim for 150 min of moderate intensity exercise weekly for heart health, and weights twice weekly for bone health   Aim  for 7-9 hours of sleep daily   When it comes to diets, agreement about the perfect plan isnt easy to find, even among the experts. Experts at the [employer] developed an idea known as the Healthy Eating Plate. Just imagine a plate divided into logical, healthy portions.   The emphasis is on diet quality:   Load up on vegetables and fruits - one-half of your plate: Aim for color and variety, and remember that potatoes dont count.   Go for whole grains - one-quarter of your plate: Whole wheat, barley, wheat berries, quinoa, oats, brown rice, and foods made with them. If you want pasta, go with whole wheat pasta.   Protein power - one-quarter of your plate: Fish, chicken, beans, and nuts are all healthy, versatile protein sources. Limit red meat.   The diet, however, does go beyond the plate, offering a few other suggestions.   Use healthy plant oils, such as olive, canola, soy, corn, sunflower and peanut. Check the labels, and avoid partially hydrogenated oil, which have unhealthy trans fats.   If youre thirsty, drink water. Coffee and tea are good in moderation, but skip sugary drinks and limit milk and dairy products to one or two daily servings.   The type of carbohydrate in the diet is more important than the amount. Some sources of carbohydrates, such as vegetables, fruits, whole grains, and beans-are healthier than others.   Finally, stay active  Signed, Berniece Salines, DO  02/05/2020 9:12 AM    [place]

## 2020-02-05 NOTE — Telephone Encounter (Signed)
Patient's son [name] called about his father cath that is being done on Friday, he wants to know if there is anyway that if there is any possible chance it can be done on Thursday.  He would like a callback to discuss.

## 2020-02-05 NOTE — Telephone Encounter (Signed)
Returned call to Hulan Fess he states that he is trying to arrange transportation and care for mother with his siblings. He will continue to do so, informed him that pt is to be at [facility] at [place]. He verbalizes understanding and that Dr Martinique will be doing the CATH himself. He will call back later if he needs to reschedule.

## 2020-02-06 ENCOUNTER — Telehealth: Payer: Self-pay | Admitting: Emergency Medicine

## 2020-02-06 ENCOUNTER — Telehealth: Payer: Self-pay | Admitting: Cardiology

## 2020-02-06 ENCOUNTER — Telehealth: Payer: Self-pay | Admitting: Unknown Physician Specialty

## 2020-02-06 DIAGNOSIS — Z20822 Contact with and (suspected) exposure to covid-19: Secondary | ICD-10-CM | POA: Diagnosis not present

## 2020-02-06 LAB — CBC
Hematocrit: 37.4 % — ABNORMAL LOW (ref 37.5–51.0)
Hemoglobin: 13.3 g/dL (ref 13.0–17.7)
MCH: 35.8 pg — ABNORMAL HIGH (ref 26.6–33.0)
MCHC: 35.6 g/dL (ref 31.5–35.7)
MCV: 101 fL — ABNORMAL HIGH (ref 79–97)
Platelets: 169 10*3/uL (ref 150–450)
RBC: 3.71 x10E6/uL — ABNORMAL LOW (ref 4.14–5.80)
RDW: 13.6 % (ref 11.6–15.4)
WBC: 6.6 10*3/uL (ref 3.4–10.8)

## 2020-02-06 LAB — BASIC METABOLIC PANEL
BUN/Creatinine Ratio: 15 (ref 10–24)
BUN: 21 mg/dL (ref 8–27)
CO2: 26 mmol/L (ref 20–29)
Calcium: 10.6 mg/dL — ABNORMAL HIGH (ref 8.6–10.2)
Chloride: 97 mmol/L (ref 96–106)
Creatinine, Ser: 1.4 mg/dL — ABNORMAL HIGH (ref 0.76–1.27)
GFR calc Af Amer: 53 mL/min/{1.73_m2} — ABNORMAL LOW (ref >59)
GFR calc non Af Amer: 45 mL/min/{1.73_m2} — ABNORMAL LOW (ref >59)
Glucose: 78 mg/dL (ref 65–99)
Potassium: 4.2 mmol/L (ref 3.5–5.2)
Sodium: 137 mmol/L (ref 134–144)

## 2020-02-06 NOTE — Telephone Encounter (Signed)
Lilia Pro, nurse with [facility], states the patient has tested positive for COVID-19. She states she would like to make Dr. Martinique aware prior to heart cath procedure scheduled for 02/08/20.

## 2020-02-06 NOTE — Telephone Encounter (Signed)
Called patient at work number informed him that he tested positive for covid and that his cath will be canceled and he needs to reach out to his pcp. Routed results to pcp. Scheduled patient for appointment with Dr. Agustin Cree in 2 weeks. Cath was cancelled.

## 2020-02-06 NOTE — Telephone Encounter (Addendum)
Cath has been cancelled and patient notified, see other phone note from today

## 2020-02-06 NOTE — Telephone Encounter (Signed)
Called to discuss with [name] about Covid symptoms and the use of bamlanivimab, a monoclonal antibody infusion for those with mild to moderate Covid symptoms and at a high risk of hospitalization.     Pt does not qualify for infusion therapy as he has an asymptomatic infection. Isolation precautions discussed. Advised to contact back for consideration should he develop symptoms. Patient verbalized understanding.      Patient Active Problem List   Diagnosis Date Noted  . PAF (paroxysmal atrial fibrillation) ([place]) 02/05/2020  . Depressed left ventricular ejection fraction 02/05/2020  . Essential hypertension 02/05/2020  . Dilated cardiomyopathy ([place]) 03/19/2019  . Persistent atrial fibrillation ([place]) 03/19/2019  . Acute on chronic combined systolic and diastolic CHF (congestive heart failure) ([place]) 03/19/2019  . Dyspnea on exertion 03/19/2019  . Sensorineural hearing loss (SNHL), bilateral 08/11/2018  . Hammer toes of both feet 01/30/2018  . Nail dystrophy 01/30/2018  . Pre-ulcerative calluses 01/30/2018  . Toenail fungus 01/30/2018  . History of thyroid cancer 12/16/2016  . Lung mass   . Occult malignancy ([place])   . Hypercalcemia 12/26/2014  . AKI (acute kidney injury) ([place]) 12/26/2014  . Decreased oral intake 12/26/2014  . Myalgia 11/04/2014  . Arthralgia 11/04/2014  . Hypothyroidism associated with surgical procedure 10/07/2014  . DJD (degenerative joint disease) 10/07/2014  . Atypical mycobacterium infection

## 2020-02-06 NOTE — Telephone Encounter (Signed)
Left message for patient to return call regarding covid test results.

## 2020-02-06 NOTE — Progress Notes (Signed)
Dr. Doug Sou office notified of pt's positive COVID test. Jeanette Caprice, will notify Dr. Martinique and Dr. Doug Sou nurse. Callback number left with Jeanette Caprice if needed.   Jacqlyn Larsen, RN

## 2020-02-07 NOTE — Telephone Encounter (Signed)
Patient came in office on 02/05/2020 and was informed of all this information.

## 2020-02-08 ENCOUNTER — Ambulatory Visit (HOSPITAL_COMMUNITY): Admission: RE | Admit: 2020-02-08 | Payer: Medicare Other | Source: Home / Self Care | Admitting: Cardiology

## 2020-02-08 ENCOUNTER — Encounter (HOSPITAL_COMMUNITY): Admission: RE | Payer: Self-pay | Source: Home / Self Care

## 2020-02-08 SURGERY — LEFT HEART CATH AND CORONARY ANGIOGRAPHY
Anesthesia: LOCAL

## 2020-02-20 ENCOUNTER — Telehealth: Payer: Self-pay | Admitting: Cardiology

## 2020-02-20 NOTE — Telephone Encounter (Signed)
Patient's son calling to reschedule the patient's cath appointment.

## 2020-02-20 NOTE — Telephone Encounter (Signed)
Please see additional phone encounter.

## 2020-02-20 NOTE — Telephone Encounter (Signed)
Called son per Dr. Agustin Cree informed him that we should just see the patient on Friday and will determine from there if we will reschedule the cath at this time time. During the call the patients son reports that the patient has had a increase in weight gain over the past couple weeks of 6 lbs. Still unsure about shortness of breath. Will inform Dr. Agustin Cree. Dr. Agustin Cree aware for now we will just see patient at upcoming appointment.

## 2020-02-20 NOTE — Telephone Encounter (Signed)
Called spoke to patients son per dpr. They want to reschedule cardiac catheterization to either next Tuesday 02/26/2020 or the week after that. I will check to see what they have available. During the call the patient's son also reports that his wife noticed the patients face seemed to be swelling recently, no swelling in lower extremities or shortness of breath that is any worse than normal. The son's wife also noticed the patient to be jaundiced and then later pale? Unsure if this is a concern we would address but will pass along to Dr. Agustin Cree to see if he has any recommendations. Patient has appointment with Dr. Agustin Cree this Friday, they want to know if he should keeps this appointment will consult with Dr. Agustin Cree about this as well.

## 2020-02-20 NOTE — Telephone Encounter (Signed)
Pt c/o swelling: STAT is pt has developed SOB within 24 hours  1) How much weight have you gained and in what time span? Not sure  2) If swelling, where is the swelling located? face  3) Are you currently taking a fluid pill? yes  4) Are you currently SOB? no  5) Do you have a log of your daily weights (if so, list)? no  6) Have you gained 3 pounds in a day or 5 pounds in a week? no  7) Have you traveled recently? No  Patient's son states his wife noticed the patient had swelling in his face. He states he is not having any other symptoms and does not think he has gained weight. Please advise.

## 2020-02-21 ENCOUNTER — Other Ambulatory Visit: Payer: Self-pay | Admitting: Cardiology

## 2020-02-22 ENCOUNTER — Encounter: Payer: Self-pay | Admitting: Cardiology

## 2020-02-22 ENCOUNTER — Other Ambulatory Visit: Payer: Self-pay

## 2020-02-22 ENCOUNTER — Ambulatory Visit: Payer: Medicare Other | Admitting: Cardiology

## 2020-02-22 VITALS — BP 126/80 | HR 108 | Temp 97.3°F | Ht 71.0 in | Wt 166.2 lb

## 2020-02-22 DIAGNOSIS — I4819 Other persistent atrial fibrillation: Secondary | ICD-10-CM | POA: Diagnosis not present

## 2020-02-22 DIAGNOSIS — I420 Dilated cardiomyopathy: Secondary | ICD-10-CM

## 2020-02-22 DIAGNOSIS — I10 Essential (primary) hypertension: Secondary | ICD-10-CM

## 2020-02-22 NOTE — H&P (View-Only) (Signed)
Cardiology Office Note:    Date:  02/22/2020   ID:  Curtis Ramos, DOB 03/21/1932, MRN [id number]  PCP:  Cyndi Bender, PA-C  Cardiologist:  Jenne Campus, MD    Referring MD: Cyndi Bender, PA-C   No chief complaint on file. M doing fine  History of Present Illness:    Curtis Ramos is a 86 y.o. male  male the past medical history is significant for cardiomyopathy, diagnosed months ago with ejection fraction 25 to 30%, also atrial fibrillation, gradual in 20 put him on right medication which is somewhat difficult.  Repeated echocardiogram however showed only mild improvement in left ventricle ejection fraction.  Still severely diminished.  We had a long discussion today with him as well as to one of his son we will talk about options for the situation. Eventually he ended up going back 2 weeks ago and said that he would like to pursue cardiac catheterization.  He went to all routine testing including COVID-19 test and COVID-19 surprisingly came positive.  He end up going to a different place to have it rechecked and came negative.  Also nobody from his family had COVID-19 infection.  Therefore, I think it was falsely positive test.  Regardless he was completely asymptomatic denies have any fever cough shortness of breath overall doing well.  He wants to pursue cardiac catheterization.  Procedure when the time was explained to him including all risk benefits as well as oriented.  His son was present during our conversation.  He is ready to proceed.  Past Medical History:  Diagnosis Date  . Acute on chronic combined systolic and diastolic CHF (congestive heart failure) ([place]) 03/19/2019  . Arthralgia 11/04/2014  . Arthritis    "proabably"  . Atypical mycobacterium infection   . Cancer [facility])    family unaware of this hx on 12/26/2014  . Decreased oral intake 12/26/2014  . Depressed left ventricular ejection fraction 02/05/2020  . Dilated cardiomyopathy ([place]) 03/19/2019   Ejection fraction 20  to 25% based on echocardiogram done by pulmonologist month ago.  Marland Kitchen DJD (degenerative joint disease) 10/07/2014  . Dyspnea on exertion 03/19/2019  . Essential hypertension 02/05/2020  . GERD (gastroesophageal reflux disease)   . Hammer toes of both feet 01/30/2018  . History of thyroid cancer 12/16/2016  . Hypercalcemia 12/26/2014  . Hypothyroidism   . Hypothyroidism associated with surgical procedure 10/07/2014  . Lung mass   . Myalgia 11/04/2014  . Mycobacterium avium complex ([place])   . Nail dystrophy 01/30/2018  . Occult malignancy ([place])   . PAF (paroxysmal atrial fibrillation) ([place]) 02/05/2020  . Persistent atrial fibrillation ([place]) 03/19/2019   Chads 2 Vascor equals 3  . PONV (postoperative nausea and vomiting)   . Pre-ulcerative calluses 01/30/2018  . Sensorineural hearing loss (SNHL), bilateral 08/11/2018  . Shingles   . Toenail fungus 01/30/2018  . Wears glasses     Past Surgical History:  Procedure Laterality Date  . APPENDECTOMY    . CATARACT EXTRACTION W/ INTRAOCULAR LENS  IMPLANT, BILATERAL Bilateral   . COLONOSCOPY    . INGUINAL HERNIA REPAIR Left   . MASS EXCISION Right 08/27/2014   Procedure: EXCISION MASS RIGHT PALM/INDEX;  Surgeon: Leanora Cover, MD;  Location: [facility];  Service: Orthopedics;  Laterality: Right;  . THYROIDECTOMY  2003    Current Medications: Current Meds  Medication Sig  . apixaban (ELIQUIS) 5 MG TABS tablet Take 1 tablet (5 mg total) by mouth 2 (two) times daily.  Marland Kitchen  carvedilol (COREG) 6.25 MG tablet TAKE 1 TABLET BY MOUTH TWICE A DAY  . CVS CLOTRIMAZOLE 1 % cream APPLY 1 (ONE) APPLICATION TO BOTH FEET TWICE A DAY FOR 2-4 WEEKS  . ENTRESTO 49-51 MG TAKE 1 TABLET BY MOUTH TWICE A DAY  . furosemide (LASIX) 40 MG tablet Take 1 tablet (40 mg total) by mouth daily.  . latanoprost (XALATAN) 0.005 % ophthalmic solution   . Multiple Vitamins-Minerals (ONE-A-DAY VITACRAVES IMMUNITY) CHEW Chew 2 tablets by mouth daily.  . Multiple  Vitamins-Minerals (PRESERVISION AREDS 2 PO) Take 1 capsule by mouth 2 (two) times daily.  . spironolactone (ALDACTONE) 25 MG tablet Take 0.5 tablets (12.5 mg total) by mouth daily.  . zinc gluconate 50 MG tablet Take 50 mg by mouth daily.     Allergies:   Patient has no known allergies.   Social History   Socioeconomic History  . Marital status: Married    Spouse name: Not on file  . Number of children: Not on file  . Years of education: Not on file  . Highest education level: Not on file  Occupational History  . Not on file  Tobacco Use  . Smoking status: Never Smoker  . Smokeless tobacco: Never Used  Substance and Sexual Activity  . Alcohol use: No  . Drug use: No  . Sexual activity: Not on file  Other Topics Concern  . Not on file  Social History Narrative  . Not on file   Social Determinants of Health   Financial Resource Strain:   . Difficulty of Paying Living Expenses:   Food Insecurity:   . Worried About Running Out of Food in the Last Year:   . Ran Out of Food in the Last Year:   Transportation Needs:   . Lack of Transportation (Medical):   . Lack of Transportation (Non-Medical):   Physical Activity:   . Days of Exercise per Week:   . Minutes of Exercise per Session:   Stress:   . Feeling of Stress :   Social Connections:   . Frequency of Communication with Friends and Family:   . Frequency of Social Gatherings with Friends and Family:   . Attends Religious Services:   . Active Member of Clubs or Organizations:   . Attends Club or Organization Meetings:   . Marital Status:      Family History: The patient's family history is not on file. ROS:   Please see the history of present illness.    All 14 point review of systems negative except as described per history of present illness  EKGs/Labs/Other Studies Reviewed:      Recent Labs: 03/15/2019: ALT 26; B Natriuretic Peptide 1,522.3 09/20/2019: NT-Pro BNP 3,376 10/22/2019: TSH 7.700 02/05/2020: BUN  21; Creatinine, Ser 1.40; Hemoglobin 13.3; Platelets 169; Potassium 4.2; Sodium 137  Recent Lipid Panel No results found for: CHOL, TRIG, HDL, CHOLHDL, VLDL, LDLCALC, LDLDIRECT  Physical Exam:    VS:  BP 126/80   Pulse (!) 108   Temp (!) 97.3 F (36.3 C)   Ht 5' 11" (1.803 m)   Wt 166 lb 3.2 oz (75.4 kg)   SpO2 100%   BMI 23.18 kg/m     Wt Readings from Last 3 Encounters:  02/22/20 166 lb 3.2 oz (75.4 kg)  02/05/20 168 lb (76.2 kg)  01/24/20 166 lb 3.2 oz (75.4 kg)     GEN:  Well nourished, well developed in no acute distress HEENT: Normal NECK: No JVD; No carotid bruits   LYMPHATICS: No lymphadenopathy CARDIAC: Irregularly irregular, no murmurs, no rubs, no gallops RESPIRATORY:  Clear to auscultation without rales, wheezing or rhonchi  ABDOMEN: Soft, non-tender, non-distended MUSCULOSKELETAL:  No edema; No deformity  SKIN: Warm and dry LOWER EXTREMITIES: no swelling NEUROLOGIC:  Alert and oriented x 3 PSYCHIATRIC:  Normal affect   ASSESSMENT:    1. Dilated cardiomyopathy ([place])   2. Essential hypertension   3. Persistent atrial fibrillation (HCC)    PLAN:    In order of problems listed above:  1. Dilated cardiomyopathy.  Last echocardiogram from October 2020 showed ejection fraction 25 to 30%.  On appropriate maximal tolerated medical therapy in spite of that still a significant diminished left ventricle ejection fraction.  As a part of work-up he need to have a cardiac catheterization done to rule out significant coronary artery disease. 2. Essential hypertension blood pressure seems to be well controlled right now.  We will continue present management. 3. Persistent atrial fibrillation.  Rate controlled he is anticoagulated which I will continue.   Medication Adjustments/Labs and Tests Ordered: Current medicines are reviewed at length with the patient today.  Concerns regarding medicines are outlined above.  No orders of the defined types were placed in this  encounter.  Medication changes: No orders of the defined types were placed in this encounter.   Signed, Park Liter, MD, [facility] 02/22/2020 10:39 AM    [place]

## 2020-02-22 NOTE — Addendum Note (Signed)
Addended by: Ashok Norris on: 02/22/2020 10:45 AM   Modules accepted: Orders

## 2020-02-22 NOTE — Patient Instructions (Signed)
Medication Instructions:  Your physician recommends that you continue on your current medications as directed. Please refer to the Current Medication list given to you today.  *If you need a refill on your cardiac medications before your next appointment, please call your pharmacy*   Lab Work: Your physician recommends that you return for lab work today: cbc, cmp  If you have labs (blood work) drawn today and your tests are completely normal, you will receive your results only by: Marland Kitchen MyChart Message (if you have MyChart) OR . A paper copy in the mail If you have any lab test that is abnormal or we need to change your treatment, we will call you to review the results.   Testing/Procedures:  You will be scheduled for aCardiac Catheterization.  1. Please arrive at the [facility] (Main Entrance A) at [facility]: [address] [place], [place] [address]   Special note: Every effort is made to have your procedure done on time. Please understand that emergencies sometimes delay scheduled procedures.  2. Diet: Do not eat solid foods after midnight. The patient may have clear liquids until 5am upon the day of the procedure.  3. Labs: You will need to have blood drawn today   *You will need to have a chest xray prior to your procedure.  4. Medication instructions in preparation for your procedure:    Stop taking Eliquis (Apixiban) two days before cath is scheduled   HOLD Lasix and spironolactone the day of the procedure.   On the morning of your procedure, take yourAspirinand any morning medicines NOT listed above. You may use sips of water.  5. Plan for one night stay--bring personal belongings. 6. Bring a current list of your medications and current insurance cards. 7. You MUST have a responsible person to drive you home. 8. Someone MUST be with you the first 24 hours after you arrive home or your discharge will be delayed. 9. Please wear clothes that  are easy to get on and off and wear slip-on shoes.  Thank you for allowing [place] to care for you! -- [facility]   Follow-Up: At [facility], you and your health needs are our priority.  As part of our continuing mission to provide you with exceptional heart care, we have created designated Provider Care Teams.  These Care Teams include your primary Cardiologist (physician) and Advanced Practice Providers (APPs -  Physician Assistants and Nurse Practitioners) who all work together to provide you with the care you need, when you need it.  We recommend signing up for the patient portal called "MyChart".  Sign up information is provided on this After Visit Summary.  MyChart is used to connect with patients for Virtual Visits (Telemedicine).  Patients are able to view lab/test results, encounter notes, upcoming appointments, etc.  Non-urgent messages can be sent to your provider as well.   To learn more about what you can do with MyChart, go to [email or web address].    Your next appointment:   6 week(s)  The format for your next appointment:   In Person  Provider:   Jenne Campus, MD   Other Instructions

## 2020-02-22 NOTE — Progress Notes (Signed)
Cardiology Office Note:    Date:  02/22/2020   ID:  Curtis Ramos, DOB 03/21/1932, MRN [id number]  PCP:  Cyndi Bender, PA-C  Cardiologist:  Jenne Campus, MD    Referring MD: Cyndi Bender, PA-C   No chief complaint on file. M doing fine  History of Present Illness:    Curtis Ramos is a 86 y.o. male  male the past medical history is significant for cardiomyopathy, diagnosed months ago with ejection fraction 25 to 30%, also atrial fibrillation, gradual in 20 put him on right medication which is somewhat difficult.  Repeated echocardiogram however showed only mild improvement in left ventricle ejection fraction.  Still severely diminished.  We had a long discussion today with him as well as to one of his son we will talk about options for the situation. Eventually he ended up going back 2 weeks ago and said that he would like to pursue cardiac catheterization.  He went to all routine testing including COVID-19 test and COVID-19 surprisingly came positive.  He end up going to a different place to have it rechecked and came negative.  Also nobody from his family had COVID-19 infection.  Therefore, I think it was falsely positive test.  Regardless he was completely asymptomatic denies have any fever cough shortness of breath overall doing well.  He wants to pursue cardiac catheterization.  Procedure when the time was explained to him including all risk benefits as well as oriented.  His son was present during our conversation.  He is ready to proceed.  Past Medical History:  Diagnosis Date  . Acute on chronic combined systolic and diastolic CHF (congestive heart failure) ([place]) 03/19/2019  . Arthralgia 11/04/2014  . Arthritis    "proabably"  . Atypical mycobacterium infection   . Cancer [facility])    family unaware of this hx on 12/26/2014  . Decreased oral intake 12/26/2014  . Depressed left ventricular ejection fraction 02/05/2020  . Dilated cardiomyopathy ([place]) 03/19/2019   Ejection fraction 20  to 25% based on echocardiogram done by pulmonologist month ago.  Marland Kitchen DJD (degenerative joint disease) 10/07/2014  . Dyspnea on exertion 03/19/2019  . Essential hypertension 02/05/2020  . GERD (gastroesophageal reflux disease)   . Hammer toes of both feet 01/30/2018  . History of thyroid cancer 12/16/2016  . Hypercalcemia 12/26/2014  . Hypothyroidism   . Hypothyroidism associated with surgical procedure 10/07/2014  . Lung mass   . Myalgia 11/04/2014  . Mycobacterium avium complex ([place])   . Nail dystrophy 01/30/2018  . Occult malignancy ([place])   . PAF (paroxysmal atrial fibrillation) ([place]) 02/05/2020  . Persistent atrial fibrillation ([place]) 03/19/2019   Chads 2 Vascor equals 3  . PONV (postoperative nausea and vomiting)   . Pre-ulcerative calluses 01/30/2018  . Sensorineural hearing loss (SNHL), bilateral 08/11/2018  . Shingles   . Toenail fungus 01/30/2018  . Wears glasses     Past Surgical History:  Procedure Laterality Date  . APPENDECTOMY    . CATARACT EXTRACTION W/ INTRAOCULAR LENS  IMPLANT, BILATERAL Bilateral   . COLONOSCOPY    . INGUINAL HERNIA REPAIR Left   . MASS EXCISION Right 08/27/2014   Procedure: EXCISION MASS RIGHT PALM/INDEX;  Surgeon: Leanora Cover, MD;  Location: [facility];  Service: Orthopedics;  Laterality: Right;  . THYROIDECTOMY  2003    Current Medications: Current Meds  Medication Sig  . apixaban (ELIQUIS) 5 MG TABS tablet Take 1 tablet (5 mg total) by mouth 2 (two) times daily.  Marland Kitchen  carvedilol (COREG) 6.25 MG tablet TAKE 1 TABLET BY MOUTH TWICE A DAY  . CVS CLOTRIMAZOLE 1 % cream APPLY 1 (ONE) APPLICATION TO BOTH FEET TWICE A DAY FOR 2-4 WEEKS  . ENTRESTO 49-51 MG TAKE 1 TABLET BY MOUTH TWICE A DAY  . furosemide (LASIX) 40 MG tablet Take 1 tablet (40 mg total) by mouth daily.  Marland Kitchen latanoprost (XALATAN) 0.005 % ophthalmic solution   . Multiple Vitamins-Minerals (ONE-A-DAY VITACRAVES IMMUNITY) CHEW Chew 2 tablets by mouth daily.  . Multiple  Vitamins-Minerals (PRESERVISION AREDS 2 PO) Take 1 capsule by mouth 2 (two) times daily.  Marland Kitchen spironolactone (ALDACTONE) 25 MG tablet Take 0.5 tablets (12.5 mg total) by mouth daily.  Marland Kitchen zinc gluconate 50 MG tablet Take 50 mg by mouth daily.     Allergies:   Patient has no known allergies.   Social History   Socioeconomic History  . Marital status: Married    Spouse name: Not on file  . Number of children: Not on file  . Years of education: Not on file  . Highest education level: Not on file  Occupational History  . Not on file  Tobacco Use  . Smoking status: Never Smoker  . Smokeless tobacco: Never Used  Substance and Sexual Activity  . Alcohol use: No  . Drug use: No  . Sexual activity: Not on file  Other Topics Concern  . Not on file  Social History Narrative  . Not on file   Social Determinants of Health   Financial Resource Strain:   . Difficulty of Paying Living Expenses:   Food Insecurity:   . Worried About Charity fundraiser in the Last Year:   . Arboriculturist in the Last Year:   Transportation Needs:   . Film/video editor (Medical):   Marland Kitchen Lack of Transportation (Non-Medical):   Physical Activity:   . Days of Exercise per Week:   . Minutes of Exercise per Session:   Stress:   . Feeling of Stress :   Social Connections:   . Frequency of Communication with Friends and Family:   . Frequency of Social Gatherings with Friends and Family:   . Attends Religious Services:   . Active Member of Clubs or Organizations:   . Attends Archivist Meetings:   Marland Kitchen Marital Status:      Family History: The patient's family history is not on file. ROS:   Please see the history of present illness.    All 14 point review of systems negative except as described per history of present illness  EKGs/Labs/Other Studies Reviewed:      Recent Labs: 03/15/2019: ALT 26; B Natriuretic Peptide 1,522.3 09/20/2019: NT-Pro BNP 3,376 10/22/2019: TSH 7.700 02/05/2020: BUN  21; Creatinine, Ser 1.40; Hemoglobin 13.3; Platelets 169; Potassium 4.2; Sodium 137  Recent Lipid Panel No results found for: CHOL, TRIG, HDL, CHOLHDL, VLDL, LDLCALC, LDLDIRECT  Physical Exam:    VS:  BP 126/80   Pulse (!) 108   Temp (!) 97.3 F (36.3 C)   Ht _0  (1.803 m)   Wt 166 lb 3.2 oz (75.4 kg)   SpO2 100%   BMI 23.18 kg/m     Wt Readings from Last 3 Encounters:  02/22/20 166 lb 3.2 oz (75.4 kg)  02/05/20 168 lb (76.2 kg)  01/24/20 166 lb 3.2 oz (75.4 kg)     GEN:  Well nourished, well developed in no acute distress HEENT: Normal NECK: No JVD; No carotid bruits  LYMPHATICS: No lymphadenopathy CARDIAC: Irregularly irregular, no murmurs, no rubs, no gallops RESPIRATORY:  Clear to auscultation without rales, wheezing or rhonchi  ABDOMEN: Soft, non-tender, non-distended MUSCULOSKELETAL:  No edema; No deformity  SKIN: Warm and dry LOWER EXTREMITIES: no swelling NEUROLOGIC:  Alert and oriented x 3 PSYCHIATRIC:  Normal affect   ASSESSMENT:    1. Dilated cardiomyopathy ([place])   2. Essential hypertension   3. Persistent atrial fibrillation (HCC)    PLAN:    In order of problems listed above:  1. Dilated cardiomyopathy.  Last echocardiogram from October 2020 showed ejection fraction 25 to 30%.  On appropriate maximal tolerated medical therapy in spite of that still a significant diminished left ventricle ejection fraction.  As a part of work-up he need to have a cardiac catheterization done to rule out significant coronary artery disease. 2. Essential hypertension blood pressure seems to be well controlled right now.  We will continue present management. 3. Persistent atrial fibrillation.  Rate controlled he is anticoagulated which I will continue.   Medication Adjustments/Labs and Tests Ordered: Current medicines are reviewed at length with the patient today.  Concerns regarding medicines are outlined above.  No orders of the defined types were placed in this  encounter.  Medication changes: No orders of the defined types were placed in this encounter.   Signed, Park Liter, MD, [facility] 02/22/2020 10:39 AM    [place]

## 2020-02-23 LAB — COMPREHENSIVE METABOLIC PANEL
ALT: 68 IU/L — ABNORMAL HIGH (ref 0–44)
AST: 71 IU/L — ABNORMAL HIGH (ref 0–40)
Albumin/Globulin Ratio: 1.5 (ref 1.2–2.2)
Albumin: 4.3 g/dL (ref 3.6–4.6)
Alkaline Phosphatase: 110 IU/L (ref 39–117)
BUN/Creatinine Ratio: 18 (ref 10–24)
BUN: 29 mg/dL — ABNORMAL HIGH (ref 8–27)
Bilirubin Total: 0.7 mg/dL (ref 0.0–1.2)
CO2: 25 mmol/L (ref 20–29)
Calcium: 10 mg/dL (ref 8.6–10.2)
Chloride: 99 mmol/L (ref 96–106)
Creatinine, Ser: 1.65 mg/dL — ABNORMAL HIGH (ref 0.76–1.27)
GFR calc Af Amer: 43 mL/min/{1.73_m2} — ABNORMAL LOW (ref >59)
GFR calc non Af Amer: 37 mL/min/{1.73_m2} — ABNORMAL LOW (ref >59)
Globulin, Total: 2.8 g/dL (ref 1.5–4.5)
Glucose: 81 mg/dL (ref 65–99)
Potassium: 4 mmol/L (ref 3.5–5.2)
Sodium: 139 mmol/L (ref 134–144)
Total Protein: 7.1 g/dL (ref 6.0–8.5)

## 2020-02-23 LAB — CBC
Hematocrit: 32.6 % — ABNORMAL LOW (ref 37.5–51.0)
Hemoglobin: 11.5 g/dL — ABNORMAL LOW (ref 13.0–17.7)
MCH: 36.1 pg — ABNORMAL HIGH (ref 26.6–33.0)
MCHC: 35.3 g/dL (ref 31.5–35.7)
MCV: 102 fL — ABNORMAL HIGH (ref 79–97)
Platelets: 184 10*3/uL (ref 150–450)
RBC: 3.19 x10E6/uL — ABNORMAL LOW (ref 4.14–5.80)
RDW: 14 % (ref 11.6–15.4)
WBC: 5.9 10*3/uL (ref 3.4–10.8)

## 2020-02-29 NOTE — Telephone Encounter (Signed)
[name] the patient's son is calling due to never being called to reschedule Curtis Ramos's Cath. Please advise.

## 2020-03-03 ENCOUNTER — Telehealth: Payer: Self-pay | Admitting: Cardiology

## 2020-03-03 DIAGNOSIS — R0989 Other specified symptoms and signs involving the circulatory and respiratory systems: Secondary | ICD-10-CM

## 2020-03-03 DIAGNOSIS — I5043 Acute on chronic combined systolic (congestive) and diastolic (congestive) heart failure: Secondary | ICD-10-CM

## 2020-03-03 DIAGNOSIS — R931 Abnormal findings on diagnostic imaging of heart and coronary circulation: Secondary | ICD-10-CM

## 2020-03-03 DIAGNOSIS — R0609 Other forms of dyspnea: Secondary | ICD-10-CM

## 2020-03-03 NOTE — Telephone Encounter (Signed)
Pts son called back to reschedule the pts LHC...  03/11/20 at 8 am to arrive at 6 am COVID testing on 03/08/20 at 11:35 am   Pts son verbalized understanding and agreed to quarantine after Covid test.   Will forward to [place] for orders.   Sent to precert.   Last labs 02/22/20 will check with Webb Silversmith if needs to repeated prior to the Cath.

## 2020-03-03 NOTE — Telephone Encounter (Signed)
COVID + 02/05/20- pt does not need retest before procedure (within 90 days of positive), pt asymptomatic.  Pt's son, [name], aware pre procedure COVID-19 test scheduled for 03/08/20 has been cancelled.  Pt should have repeat BMP/CBC within 7 days of cath (02/22/19 GFR 37) If GFR on repeat BMP < 45, cath lab guideline is if GFR <45, recommend 4 hours pre procedure hydration.  If GFR < 45 on repeat BMP, since 10/05/19 EF 25-30%, would need to get recommendation from Dr Agustin Cree about pre procedure hydration.  Will forward to Triage to arrange repeat BMP/CBC within 7 days of procedure.

## 2020-03-03 NOTE — Telephone Encounter (Signed)
Patient's son states he is calling to follow up in regards to rescheduling heart cath previously scheduled for 02/08/20. Please assist.

## 2020-03-04 NOTE — Telephone Encounter (Signed)
Patient's son, [name], is returning Morgan's call.

## 2020-03-04 NOTE — Telephone Encounter (Signed)
Left message on Davids voicemail to please return our call.

## 2020-03-04 NOTE — Addendum Note (Signed)
Addended by: Resa Miner I on: 03/04/2020 10:59 AM   Modules accepted: Orders

## 2020-03-04 NOTE — Telephone Encounter (Signed)
Spoke with patients son [name] and let him know that his father would need lab work done again prior to his cardiac cath. He said that they could come in on Friday 03/07/20 to get these labs done. I told him that this would be fine.   Orders for repeat labs have been placed.   Encouraged patient/son to call back with any questions or concerns.

## 2020-03-05 DIAGNOSIS — R0989 Other specified symptoms and signs involving the circulatory and respiratory systems: Secondary | ICD-10-CM | POA: Diagnosis not present

## 2020-03-05 DIAGNOSIS — I5043 Acute on chronic combined systolic (congestive) and diastolic (congestive) heart failure: Secondary | ICD-10-CM | POA: Diagnosis not present

## 2020-03-06 ENCOUNTER — Other Ambulatory Visit: Payer: Self-pay | Admitting: *Deleted

## 2020-03-06 LAB — BASIC METABOLIC PANEL
BUN/Creatinine Ratio: 15 (ref 10–24)
BUN: 28 mg/dL — ABNORMAL HIGH (ref 8–27)
CO2: 25 mmol/L (ref 20–29)
Calcium: 9.8 mg/dL (ref 8.6–10.2)
Chloride: 97 mmol/L (ref 96–106)
Creatinine, Ser: 1.85 mg/dL — ABNORMAL HIGH (ref 0.76–1.27)
GFR calc Af Amer: 38 mL/min/{1.73_m2} — ABNORMAL LOW (ref >59)
GFR calc non Af Amer: 32 mL/min/{1.73_m2} — ABNORMAL LOW (ref >59)
Glucose: 89 mg/dL (ref 65–99)
Potassium: 4.3 mmol/L (ref 3.5–5.2)
Sodium: 136 mmol/L (ref 134–144)

## 2020-03-06 LAB — CBC
Hematocrit: 30.9 % — ABNORMAL LOW (ref 37.5–51.0)
Hemoglobin: 10.8 g/dL — ABNORMAL LOW (ref 13.0–17.7)
MCH: 36.1 pg — ABNORMAL HIGH (ref 26.6–33.0)
MCHC: 35 g/dL (ref 31.5–35.7)
MCV: 103 fL — ABNORMAL HIGH (ref 79–97)
Platelets: 165 10*3/uL (ref 150–450)
RBC: 2.99 x10E6/uL — ABNORMAL LOW (ref 4.14–5.80)
RDW: 14.3 % (ref 11.6–15.4)
WBC: 6.4 10*3/uL (ref 3.4–10.8)

## 2020-03-06 MED ORDER — ENTRESTO 49-51 MG PO TABS: 0.5000 | ORAL_TABLET | Freq: Two times a day (BID) | ORAL | 3 refills | Status: DC

## 2020-03-08 ENCOUNTER — Other Ambulatory Visit (HOSPITAL_COMMUNITY): Payer: Medicare Other

## 2020-03-10 ENCOUNTER — Telehealth: Payer: Self-pay | Admitting: *Deleted

## 2020-03-10 ENCOUNTER — Other Ambulatory Visit: Payer: Self-pay | Admitting: Cardiology

## 2020-03-10 NOTE — Telephone Encounter (Addendum)
Pt contacted pre-catheterization scheduled at [facility] for: Tuesday March 11, 2020 12:30 PM Verified arrival time and place: [facility]) at: 7:30 AM-pre procedure   Per Dr Alease Medina flow rate 125cc/h     No solid food after midnight prior to cath, clear liquids until 5 AM day of procedure. Contrast allergy: no  Hold: Eliquis-none 03/09/20 until post procedure Lasix-day before and day of procedure-GFR 32 Spironolactone -day before and day of procedure-GFR 32 Entresto-day before and day of procedure -GFR 32  Except hold medications AM meds can be  taken pre-cath with sip of water including: ASA 81 mg   Confirmed patient has responsible adult to drive home post procedure and observe 24 hours after arriving home:  yes  You are allowed ONE visitor in the waiting room during your procedures. Both you and your visitor must wear masks.      COVID-19 Pre-Screening Questions:  . In the past 7 to 10 days have you had a cough,  shortness of breath, headache, congestion, fever (100 or greater) body aches, chills, sore throat, or sudden loss of taste or sense of smell? no . Have you been around anyone with known Covid 19 in the past 7 to 10 days? no . Have you been around anyone who is awaiting Covid 19 test results in the past 7 to 10 days?  no . Have you been around anyone who has mentioned symptoms of Covid 19 within the past 7 to 10 days? No   COVID + 02/05/20-pt asymptomatic.  I reviewed procedure/mask/visitor instructions, COVID-19 screening questions with pt's son (DPR), [name] and his wife.

## 2020-03-11 ENCOUNTER — Other Ambulatory Visit: Payer: Self-pay

## 2020-03-11 ENCOUNTER — Ambulatory Visit (HOSPITAL_COMMUNITY)
Admission: RE | Admit: 2020-03-11 | Discharge: 2020-03-11 | Disposition: A | Discharge status: Home / Self Care | Payer: Medicare Other | Attending: Interventional Cardiology | Admitting: Interventional Cardiology

## 2020-03-11 ENCOUNTER — Encounter (HOSPITAL_COMMUNITY)
Admission: RE | Disposition: A | Discharge status: Home / Self Care | Payer: Self-pay | Source: Home / Self Care | Attending: Interventional Cardiology

## 2020-03-11 DIAGNOSIS — I5022 Chronic systolic (congestive) heart failure: Secondary | ICD-10-CM | POA: Diagnosis not present

## 2020-03-11 DIAGNOSIS — E890 Postprocedural hypothyroidism: Secondary | ICD-10-CM | POA: Insufficient documentation

## 2020-03-11 DIAGNOSIS — Z7901 Long term (current) use of anticoagulants: Secondary | ICD-10-CM | POA: Insufficient documentation

## 2020-03-11 DIAGNOSIS — I420 Dilated cardiomyopathy: Secondary | ICD-10-CM | POA: Insufficient documentation

## 2020-03-11 DIAGNOSIS — H903 Sensorineural hearing loss, bilateral: Secondary | ICD-10-CM | POA: Diagnosis not present

## 2020-03-11 DIAGNOSIS — I110 Hypertensive heart disease with heart failure: Secondary | ICD-10-CM | POA: Diagnosis not present

## 2020-03-11 DIAGNOSIS — Z79899 Other long term (current) drug therapy: Secondary | ICD-10-CM | POA: Insufficient documentation

## 2020-03-11 DIAGNOSIS — K219 Gastro-esophageal reflux disease without esophagitis: Secondary | ICD-10-CM | POA: Insufficient documentation

## 2020-03-11 DIAGNOSIS — I4819 Other persistent atrial fibrillation: Secondary | ICD-10-CM | POA: Insufficient documentation

## 2020-03-11 HISTORY — PX: LEFT HEART CATH AND CORONARY ANGIOGRAPHY: CATH118249

## 2020-03-11 SURGERY — LEFT HEART CATH AND CORONARY ANGIOGRAPHY
Anesthesia: LOCAL

## 2020-03-11 MED ORDER — FENTANYL CITRATE (PF) 100 MCG/2ML IJ SOLN
INTRAMUSCULAR | Status: DC | PRN
  Administered 2020-03-11: 25 ug via INTRAVENOUS

## 2020-03-11 MED ORDER — ASPIRIN 81 MG PO CHEW
81.0000 mg | CHEWABLE_TABLET | ORAL | Status: AC
  Administered 2020-03-11: 81 mg via ORAL
  Filled 2020-03-11: qty 1

## 2020-03-11 MED ORDER — HEPARIN (PORCINE) IN NACL 1000-0.9 UT/500ML-% IV SOLN
INTRAVENOUS | Status: DC | PRN
  Administered 2020-03-11 (×2): 500 mL

## 2020-03-11 MED ORDER — MIDAZOLAM HCL 2 MG/2ML IJ SOLN
INTRAMUSCULAR | Status: AC
  Filled 2020-03-11: qty 2

## 2020-03-11 MED ORDER — VERAPAMIL HCL 2.5 MG/ML IV SOLN
INTRAVENOUS | Status: DC | PRN
  Administered 2020-03-11: 13:00:00 10 mL via INTRA_ARTERIAL

## 2020-03-11 MED ORDER — VERAPAMIL HCL 2.5 MG/ML IV SOLN
INTRAVENOUS | Status: AC
  Filled 2020-03-11: qty 2

## 2020-03-11 MED ORDER — SODIUM CHLORIDE 0.9 % IV SOLN: 250.0000 mL | INTRAVENOUS | Status: DC | PRN

## 2020-03-11 MED ORDER — SODIUM CHLORIDE 0.9 % IV SOLN: INTRAVENOUS | Status: DC

## 2020-03-11 MED ORDER — LABETALOL HCL 5 MG/ML IV SOLN: 10.0000 mg | INTRAVENOUS | Status: DC | PRN

## 2020-03-11 MED ORDER — ONDANSETRON HCL 4 MG/2ML IJ SOLN: 4.0000 mg | Freq: Four times a day (QID) | INTRAMUSCULAR | Status: DC | PRN

## 2020-03-11 MED ORDER — SODIUM CHLORIDE 0.9% FLUSH: 3.0000 mL | Freq: Two times a day (BID) | INTRAVENOUS | Status: DC

## 2020-03-11 MED ORDER — LIDOCAINE HCL (PF) 1 % IJ SOLN
INTRAMUSCULAR | Status: DC | PRN
  Administered 2020-03-11: 2 mL

## 2020-03-11 MED ORDER — HEPARIN SODIUM (PORCINE) 1000 UNIT/ML IJ SOLN
INTRAMUSCULAR | Status: DC | PRN
  Administered 2020-03-11: 4000 [IU] via INTRAVENOUS

## 2020-03-11 MED ORDER — IOHEXOL 350 MG/ML SOLN
INTRAVENOUS | Status: DC | PRN
  Administered 2020-03-11: 14:00:00 30 mL

## 2020-03-11 MED ORDER — HEPARIN (PORCINE) IN NACL 1000-0.9 UT/500ML-% IV SOLN
INTRAVENOUS | Status: AC
  Filled 2020-03-11: qty 1000

## 2020-03-11 MED ORDER — LIDOCAINE HCL (PF) 1 % IJ SOLN
INTRAMUSCULAR | Status: AC
  Filled 2020-03-11: qty 30

## 2020-03-11 MED ORDER — MIDAZOLAM HCL 2 MG/2ML IJ SOLN
INTRAMUSCULAR | Status: DC | PRN
  Administered 2020-03-11: 1 mg via INTRAVENOUS

## 2020-03-11 MED ORDER — FENTANYL CITRATE (PF) 100 MCG/2ML IJ SOLN
INTRAMUSCULAR | Status: AC
  Filled 2020-03-11: qty 2

## 2020-03-11 MED ORDER — SODIUM CHLORIDE 0.9% FLUSH: 3.0000 mL | INTRAVENOUS | Status: DC | PRN

## 2020-03-11 MED ORDER — HYDRALAZINE HCL 20 MG/ML IJ SOLN: 10.0000 mg | INTRAMUSCULAR | Status: DC | PRN

## 2020-03-11 MED ORDER — ACETAMINOPHEN 325 MG PO TABS: 650.0000 mg | ORAL_TABLET | ORAL | Status: DC | PRN

## 2020-03-11 MED ORDER — APIXABAN 5 MG PO TABS: 5.0000 mg | ORAL_TABLET | Freq: Two times a day (BID) | ORAL | 3 refills | Status: DC

## 2020-03-11 MED ORDER — SODIUM CHLORIDE 0.9 % IV SOLN: INTRAVENOUS | Status: AC

## 2020-03-11 MED ORDER — HEPARIN SODIUM (PORCINE) 1000 UNIT/ML IJ SOLN
INTRAMUSCULAR | Status: AC
  Filled 2020-03-11: qty 1

## 2020-03-11 SURGICAL SUPPLY — 9 items
CATH 5FR JL3.5 JR4 ANG PIG MP (CATHETERS) ×2 IMPLANT
DEVICE RAD COMP TR BAND LRG (VASCULAR PRODUCTS) ×2 IMPLANT
GLIDESHEATH SLEND SS 6F .021 (SHEATH) ×2 IMPLANT
GUIDEWIRE INQWIRE 1.5J.035X260 (WIRE) ×1 IMPLANT
INQWIRE 1.5J .035X260CM (WIRE) ×2
KIT HEART LEFT (KITS) ×2 IMPLANT
PACK CARDIAC CATHETERIZATION (CUSTOM PROCEDURE TRAY) ×2 IMPLANT
TRANSDUCER W/STOPCOCK (MISCELLANEOUS) ×2 IMPLANT
TUBING CIL FLEX 10 FLL-RA (TUBING) ×2 IMPLANT

## 2020-03-11 NOTE — Progress Notes (Addendum)
Discharge instructions reviewed with pt  And his son [name] (via telephone) both voice understanding.

## 2020-03-11 NOTE — Discharge Instructions (Signed)
Radial Site Care  This sheet gives you information about how to care for yourself after your procedure. Your health care provider may also give you more specific instructions. If you have problems or questions, contact your health care provider. What can I expect after the procedure? After the procedure, it is common to have:  Bruising and tenderness at the catheter insertion area. Follow these instructions at home: Medicines  Take over-the-counter and prescription medicines only as told by your health care provider. Insertion site care  Follow instructions from your health care provider about how to take care of your insertion site. Make sure you: ? Wash your hands with soap and water before you change your bandage (dressing). If soap and water are not available, use hand sanitizer. ? Change your dressing as told by your health care provider. ? Leave stitches (sutures), skin glue, or adhesive strips in place. These skin closures may need to stay in place for 2 weeks or longer. If adhesive strip edges start to loosen and curl up, you may trim the loose edges. Do not remove adhesive strips completely unless your health care provider tells you to do that.  Check your insertion site every day for signs of infection. Check for: ? Redness, swelling, or pain. ? Fluid or blood. ? Pus or a bad smell. ? Warmth.  Do not take baths, swim, or use a hot tub until your health care provider approves.  You may shower 24-48 hours after the procedure, or as directed by your health care provider. ? Remove the dressing and gently wash the site with plain soap and water. ? Pat the area dry with a clean towel. ? Do not rub the site. That could cause bleeding.  Do not apply powder or lotion to the site. Activity   For 24 hours after the procedure, or as directed by your health care provider: ? Do not flex or bend the affected arm. ? Do not push or pull heavy objects with the affected arm. ? Do not  drive yourself home from the hospital or clinic. You may drive 24 hours after the procedure unless your health care provider tells you not to. ? Do not operate machinery or power tools.  Do not lift anything that is heavier than 10 lb (4.5 kg), or the limit that you are told, until your health care provider says that it is safe.  Ask your health care provider when it is okay to: ? Return to work or school. ? Resume usual physical activities or sports. ? Resume sexual activity. General instructions  If the catheter site starts to bleed, raise your arm and put firm pressure on the site. If the bleeding does not stop, get help right away. This is a medical emergency.  If you went home on the same day as your procedure, a responsible adult should be with you for the first 24 hours after you arrive home.  Keep all follow-up visits as told by your health care provider. This is important. Contact a health care provider if:  You have a fever.  You have redness, swelling, or yellow drainage around your insertion site. Get help right away if:  You have unusual pain at the radial site.  The catheter insertion area swells very fast.  The insertion area is bleeding, and the bleeding does not stop when you hold steady pressure on the area.  Your arm or hand becomes pale, cool, tingly, or numb. These symptoms may represent a serious problem   that is an emergency. Do not wait to see if the symptoms will go away. Get medical help right away. Call your local emergency services (911 in the [place] Do not drive yourself to the hospital. Summary  After the procedure, it is common to have bruising and tenderness at the site.  Follow instructions from your health care provider about how to take care of your radial site wound. Check the wound every day for signs of infection.  Do not lift anything that is heavier than 10 lb (4.5 kg), or the limit that you are told, until your health care provider says  that it is safe. This information is not intended to replace advice given to you by your health care provider. Make sure you discuss any questions you have with your health care provider. Document Revised: 12/07/2017 Document Reviewed: 12/07/2017 Elsevier Patient Education  2020 Elsevier Inc.  

## 2020-03-11 NOTE — Interval H&P Note (Signed)
Cath Lab Visit (complete for each Cath Lab visit)  Clinical Evaluation Leading to the Procedure:   ACS: No.  Non-ACS:    Anginal Classification: CCS II  Anti-ischemic medical therapy: Minimal Therapy (1 class of medications)  Non-Invasive Test Results: High-risk stress test findings: cardiac mortality >3%/year  Prior CABG: No previous CABG   Low EF by echo.   History and Physical Interval Note:  03/11/2020 12:49 PM  [name]  has presented today for surgery, with the diagnosis of cardiomyopathy - role out cad.  The various methods of treatment have been discussed with the patient and family. After consideration of risks, benefits and other options for treatment, the patient has consented to  Procedure(s): LEFT HEART CATH AND CORONARY ANGIOGRAPHY (N/A) as a surgical intervention.  The patient's history has been reviewed, patient examined, no change in status, stable for surgery.  I have reviewed the patient's chart and labs.  Questions were answered to the patient's satisfaction.     Larae Grooms

## 2020-03-17 ENCOUNTER — Telehealth: Payer: Self-pay | Admitting: Cardiology

## 2020-03-17 NOTE — Telephone Encounter (Signed)
New Message    Pts son is calling and is wondering if he needs a sooner appt with Dr Agustin Cree to discuss the heart cath    Please call back

## 2020-03-17 NOTE — Telephone Encounter (Signed)
His cardiac catheterization did not show an obstructive lesion.  He need to keep follow-up appointment as scheduled.

## 2020-03-18 NOTE — Telephone Encounter (Signed)
Patient son called back and was informed he does not need a sooner appointment

## 2020-03-18 NOTE — Telephone Encounter (Signed)
Left message for patient son to return call

## 2020-03-18 NOTE — Telephone Encounter (Signed)
Patient returning call. Transferred call to the nurse. 

## 2020-03-28 DIAGNOSIS — H353221 Exudative age-related macular degeneration, left eye, with active choroidal neovascularization: Secondary | ICD-10-CM | POA: Diagnosis not present

## 2020-03-28 DIAGNOSIS — H353112 Nonexudative age-related macular degeneration, right eye, intermediate dry stage: Secondary | ICD-10-CM | POA: Diagnosis not present

## 2020-03-31 ENCOUNTER — Telehealth: Payer: Self-pay | Admitting: Cardiology

## 2020-03-31 NOTE — Telephone Encounter (Signed)
New message  Pt c/o medication issue:  1. Name of Medication: Glucosamine or Tumeric  2. How are you currently taking this medication (dosage and times per day)? Not taking yet  3. Are you having a reaction (difficulty breathing--STAT)? No   4. What is your medication issue? Patient/patient's son is calling in to see if patient can take one of the medications listed above to help with joint pain and knee pain. Please give patient/patient's son a call back to discuss.

## 2020-04-01 ENCOUNTER — Other Ambulatory Visit: Payer: Self-pay | Admitting: Cardiology

## 2020-04-01 NOTE — Telephone Encounter (Signed)
Yes he can take those medications

## 2020-04-02 NOTE — Telephone Encounter (Signed)
Left a detailed message letting the patient son know that Dr. Agustin Cree said it would be fine for him to take either of those medications. I also gave our call back number and instructed the son to call back with any other quesstions or concerns.

## 2020-04-03 DIAGNOSIS — H353221 Exudative age-related macular degeneration, left eye, with active choroidal neovascularization: Secondary | ICD-10-CM | POA: Diagnosis not present

## 2020-04-03 DIAGNOSIS — H35373 Puckering of macula, bilateral: Secondary | ICD-10-CM | POA: Diagnosis not present

## 2020-04-03 DIAGNOSIS — H353112 Nonexudative age-related macular degeneration, right eye, intermediate dry stage: Secondary | ICD-10-CM | POA: Diagnosis not present

## 2020-04-16 ENCOUNTER — Ambulatory Visit (INDEPENDENT_AMBULATORY_CARE_PROVIDER_SITE_OTHER): Payer: Medicare Other | Admitting: Cardiology

## 2020-04-16 ENCOUNTER — Other Ambulatory Visit: Payer: Self-pay

## 2020-04-16 ENCOUNTER — Encounter: Payer: Self-pay | Admitting: Cardiology

## 2020-04-16 VITALS — BP 140/80 | HR 42 | Ht 71.0 in | Wt 167.0 lb

## 2020-04-16 DIAGNOSIS — R0989 Other specified symptoms and signs involving the circulatory and respiratory systems: Secondary | ICD-10-CM

## 2020-04-16 DIAGNOSIS — R609 Edema, unspecified: Secondary | ICD-10-CM

## 2020-04-16 DIAGNOSIS — I10 Essential (primary) hypertension: Secondary | ICD-10-CM | POA: Diagnosis not present

## 2020-04-16 DIAGNOSIS — T81718A Complication of other artery following a procedure, not elsewhere classified, initial encounter: Secondary | ICD-10-CM

## 2020-04-16 DIAGNOSIS — I4819 Other persistent atrial fibrillation: Secondary | ICD-10-CM

## 2020-04-16 DIAGNOSIS — I729 Aneurysm of unspecified site: Secondary | ICD-10-CM | POA: Insufficient documentation

## 2020-04-16 DIAGNOSIS — I420 Dilated cardiomyopathy: Secondary | ICD-10-CM

## 2020-04-16 HISTORY — DX: Aneurysm of unspecified site: I72.9

## 2020-04-16 NOTE — Patient Instructions (Signed)
Medication Instructions:  Your physician recommends that you continue on your current medications as directed. Please refer to the Current Medication list given to you today.  *If you need a refill on your cardiac medications before your next appointment, please call your pharmacy*   Lab Work: Your physician recommends that you return for lab work today: cmp, cbc   If you have labs (blood work) drawn today and your tests are completely normal, you will receive your results only by: Marland Kitchen MyChart Message (if you have MyChart) OR . A paper copy in the mail If you have any lab test that is abnormal or we need to change your treatment, we will call you to review the results.   Testing/Procedures: Your physician has requested that you have upper extremity arterial exercise duplex. During this test, exercise and ultrasound are used to evaluate arterial blood flow in the legs. Allow one hour for this exam. There are no restrictions or special instructions.   Follow-Up: At [facility], you and your health needs are our priority.  As part of our continuing mission to provide you with exceptional heart care, we have created designated Provider Care Teams.  These Care Teams include your primary Cardiologist (physician) and Advanced Practice Providers (APPs -  Physician Assistants and Nurse Practitioners) who all work together to provide you with the care you need, when you need it.  We recommend signing up for the patient portal called "MyChart".  Sign up information is provided on this After Visit Summary.  MyChart is used to connect with patients for Virtual Visits (Telemedicine).  Patients are able to view lab/test results, encounter notes, upcoming appointments, etc.  Non-urgent messages can be sent to your provider as well.   To learn more about what you can do with MyChart, go to [email or web address].    Your next appointment:   2 month(s)  The format for your next appointment:   In  Person  Provider:   Jenne Campus, MD   Other Instructions

## 2020-04-16 NOTE — Progress Notes (Signed)
Cardiology Office Note:    Date:  04/16/2020   ID:  Curtis Ramos, DOB 12/15/1931, MRN [id number]  PCP:  Cyndi Bender, PA-C  Cardiologist:  Jenne Campus, MD    Referring MD: Cyndi Bender, PA-C   Chief Complaint  Patient presents with  . Follow-up    6 WK FU     History of Present Illness:    Curtis Ramos is a 86 y.o. male past medical history significant for congestive heart failure New [place] Heart Association class II, cardiomyopathy with severely reduced left ventricle ejection fraction, atrial fibrillation which is permanent.  Comes today to my office after he had cardiac catheterization done few weeks ago.  His cardiac catheterization did not show any evidence of coronary artery disease.  Overall he complained of being weak tired and exhausted and his son who always with him confirm that.  Denies have any chest pain tightness squeezing pressure burning chest  Past Medical History:  Diagnosis Date  . Acute on chronic combined systolic and diastolic CHF (congestive heart failure) ([place]) 03/19/2019  . Arthralgia 11/04/2014  . Arthritis    "proabably"  . Atypical mycobacterium infection   . Cancer [name])    family unaware of this hx on 12/26/2014  . Decreased oral intake 12/26/2014  . Depressed left ventricular ejection fraction 02/05/2020  . Dilated cardiomyopathy ([place]) 03/19/2019   Ejection fraction 20 to 25% based on echocardiogram done by pulmonologist month ago.  Marland Kitchen DJD (degenerative joint disease) 10/07/2014  . Dyspnea on exertion 03/19/2019  . Essential hypertension 02/05/2020  . GERD (gastroesophageal reflux disease)   . Hammer toes of both feet 01/30/2018  . History of thyroid cancer 12/16/2016  . Hypercalcemia 12/26/2014  . Hypothyroidism   . Hypothyroidism associated with surgical procedure 10/07/2014  . Lung mass   . Myalgia 11/04/2014  . Mycobacterium avium complex ([place])   . Nail dystrophy 01/30/2018  . Occult malignancy ([place])   . PAF (paroxysmal atrial fibrillation)  ([place]) 02/05/2020  . Persistent atrial fibrillation ([place]) 03/19/2019   Chads 2 Vascor equals 3  . PONV (postoperative nausea and vomiting)   . Pre-ulcerative calluses 01/30/2018  . Sensorineural hearing loss (SNHL), bilateral 08/11/2018  . Shingles   . Toenail fungus 01/30/2018  . Wears glasses     Past Surgical History:  Procedure Laterality Date  . APPENDECTOMY    . CATARACT EXTRACTION W/ INTRAOCULAR LENS  IMPLANT, BILATERAL Bilateral   . COLONOSCOPY    . INGUINAL HERNIA REPAIR Left   . LEFT HEART CATH AND CORONARY ANGIOGRAPHY N/A 03/11/2020   Procedure: LEFT HEART CATH AND CORONARY ANGIOGRAPHY;  Surgeon: Jettie Booze, MD;  Location: [place] [facility];  Service: Cardiovascular;  Laterality: N/A;  . MASS EXCISION Right 08/27/2014   Procedure: EXCISION MASS RIGHT PALM/INDEX;  Surgeon: Leanora Cover, MD;  Location: [place];  Service: Orthopedics;  Laterality: Right;  . THYROIDECTOMY  2003    Current Medications: Current Meds  Medication Sig  . apixaban (ELIQUIS) 5 MG TABS tablet Take 1 tablet (5 mg total) by mouth 2 (two) times daily.  . carvedilol (COREG) 6.25 MG tablet TAKE 1 TABLET BY MOUTH TWICE A DAY  . furosemide (LASIX) 40 MG tablet TAKE 1 TABLET BY MOUTH EVERY DAY  . latanoprost (XALATAN) 0.005 % ophthalmic solution Place 1 drop into both eyes at bedtime.   . montelukast (SINGULAIR) 10 MG tablet Take 10 mg by mouth at bedtime.  . Multiple Vitamins-Minerals (PRESERVISION AREDS 2 PO) Take  1 capsule by mouth 2 (two) times daily.  . sacubitril-valsartan (ENTRESTO) 49-51 MG Take 0.5 tablets by mouth 2 (two) times daily.  Marland Kitchen spironolactone (ALDACTONE) 25 MG tablet Take 0.5 tablets (12.5 mg total) by mouth daily.     Allergies:   Patient has no known allergies.   Social History   Socioeconomic History  . Marital status: Married    Spouse name: Not on file  . Number of children: Not on file  . Years of education: Not on file  . Highest education level: Not  on file  Occupational History  . Not on file  Tobacco Use  . Smoking status: Never Smoker  . Smokeless tobacco: Never Used  Substance and Sexual Activity  . Alcohol use: No  . Drug use: No  . Sexual activity: Not on file  Other Topics Concern  . Not on file  Social History Narrative  . Not on file   Social Determinants of Health   Financial Resource Strain:   . Difficulty of Paying Living Expenses:   Food Insecurity:   . Worried About Charity fundraiser in the Last Year:   . Arboriculturist in the Last Year:   Transportation Needs:   . Film/video editor (Medical):   Marland Kitchen Lack of Transportation (Non-Medical):   Physical Activity:   . Days of Exercise per Week:   . Minutes of Exercise per Session:   Stress:   . Feeling of Stress :   Social Connections:   . Frequency of Communication with Friends and Family:   . Frequency of Social Gatherings with Friends and Family:   . Attends Religious Services:   . Active Member of Clubs or Organizations:   . Attends Archivist Meetings:   Marland Kitchen Marital Status:      Family History: The patient's family history is not on file. ROS:   Please see the history of present illness.    All 14 point review of systems negative except as described per history of present illness  EKGs/Labs/Other Studies Reviewed:      Recent Labs: 09/20/2019: NT-Pro BNP 3,376 10/22/2019: TSH 7.700 02/22/2020: ALT 68 03/05/2020: BUN 28; Creatinine, Ser 1.85; Hemoglobin 10.8; Platelets 165; Potassium 4.3; Sodium 136  Recent Lipid Panel No results found for: CHOL, TRIG, HDL, CHOLHDL, VLDL, LDLCALC, LDLDIRECT  Physical Exam:    VS:  BP 140/80   Pulse (!) 42   Ht _0  (1.803 m)   Wt 167 lb (75.8 kg)   SpO2 91%   BMI 23.29 kg/m     Wt Readings from Last 3 Encounters:  04/16/20 167 lb (75.8 kg)  03/11/20 165 lb (74.8 kg)  02/22/20 166 lb 3.2 oz (75.4 kg)     GEN:  Well nourished, well developed in no acute distress HEENT: Normal NECK: No  JVD; No carotid bruits LYMPHATICS: No lymphadenopathy CARDIAC: Irregular irregular, no murmurs, no rubs, no gallops RESPIRATORY:  Clear to auscultation without rales, wheezing or rhonchi  ABDOMEN: Soft, non-tender, non-distended MUSCULOSKELETAL:  No edema; No deformity  SKIN: Warm and dry LOWER EXTREMITIES: no swelling NEUROLOGIC:  Alert and oriented x 3 PSYCHIATRIC:  Normal affect   ASSESSMENT:    1. Edema, unspecified type   2. Dilated cardiomyopathy ([place])   3. Persistent atrial fibrillation ([place])   4. Pseudoaneurysm following procedure ([place])   5. Essential hypertension   6. Depressed left ventricular ejection fraction    PLAN:    In order of problems listed  above:  1. Dilated cardiomyopathy.  He is on appropriate medication, I will check his Chem-7 to see if I can augment his therapy. 2. Persistent atrial fibrillation rate controlled, continue anticoagulation. 3. Pseudoaneurysm of the right radial artery we did a quick look with ultrasound, tomorrow he will have full official procedure done.  He is asymptomatic and the risk is about 5 mm in size. 4. Essential hypertension blood pressure well controlled continue present management.   Medication Adjustments/Labs and Tests Ordered: Current medicines are reviewed at length with the patient today.  Concerns regarding medicines are outlined above.  Orders Placed This Encounter  Procedures  . Comp Met (CMET)  . CBC  . VAS [place] UPPER EXTREMITY ARTERIAL DUPLEX   Medication changes: No orders of the defined types were placed in this encounter.   Signed, Park Liter, MD, [facility] 04/16/2020 4:51 PM    [place]

## 2020-04-17 ENCOUNTER — Ambulatory Visit (INDEPENDENT_AMBULATORY_CARE_PROVIDER_SITE_OTHER): Payer: Medicare Other

## 2020-04-17 DIAGNOSIS — R609 Edema, unspecified: Secondary | ICD-10-CM | POA: Diagnosis not present

## 2020-04-17 DIAGNOSIS — T81718A Complication of other artery following a procedure, not elsewhere classified, initial encounter: Secondary | ICD-10-CM | POA: Diagnosis not present

## 2020-04-17 LAB — COMPREHENSIVE METABOLIC PANEL
ALT: 228 IU/L — ABNORMAL HIGH (ref 0–44)
AST: 247 IU/L — ABNORMAL HIGH (ref 0–40)
Albumin/Globulin Ratio: 1.7 (ref 1.2–2.2)
Albumin: 4.5 g/dL (ref 3.6–4.6)
Alkaline Phosphatase: 126 IU/L — ABNORMAL HIGH (ref 48–121)
BUN/Creatinine Ratio: 16 (ref 10–24)
BUN: 23 mg/dL (ref 8–27)
Bilirubin Total: 0.4 mg/dL (ref 0.0–1.2)
CO2: 24 mmol/L (ref 20–29)
Calcium: 10.2 mg/dL (ref 8.6–10.2)
Chloride: 101 mmol/L (ref 96–106)
Creatinine, Ser: 1.46 mg/dL — ABNORMAL HIGH (ref 0.76–1.27)
GFR calc Af Amer: 50 mL/min/{1.73_m2} — ABNORMAL LOW (ref >59)
GFR calc non Af Amer: 43 mL/min/{1.73_m2} — ABNORMAL LOW (ref >59)
Globulin, Total: 2.6 g/dL (ref 1.5–4.5)
Glucose: 83 mg/dL (ref 65–99)
Potassium: 4 mmol/L (ref 3.5–5.2)
Sodium: 140 mmol/L (ref 134–144)
Total Protein: 7.1 g/dL (ref 6.0–8.5)

## 2020-04-17 LAB — CBC
Hematocrit: 28.6 % — ABNORMAL LOW (ref 37.5–51.0)
Hemoglobin: 10.1 g/dL — ABNORMAL LOW (ref 13.0–17.7)
MCH: 37.3 pg — ABNORMAL HIGH (ref 26.6–33.0)
MCHC: 35.3 g/dL (ref 31.5–35.7)
MCV: 106 fL — ABNORMAL HIGH (ref 79–97)
Platelets: 156 10*3/uL (ref 150–450)
RBC: 2.71 x10E6/uL — CL (ref 4.14–5.80)
RDW: 14.4 % (ref 11.6–15.4)
WBC: 6.1 10*3/uL (ref 3.4–10.8)

## 2020-04-17 NOTE — Progress Notes (Addendum)
Unilateral right upper extremity arterial duplex exam performed.  Jimmy Alea Ryer RDCS, RVT

## 2020-04-23 ENCOUNTER — Telehealth: Payer: Self-pay | Admitting: Emergency Medicine

## 2020-04-23 NOTE — Telephone Encounter (Signed)
Called patient and checked on his wrist per Dr. Agustin Cree. Patient reports the swelling is a little better. He will still watch and let us know if it changes or gets worse. Patient verbally understood. No further questions. Dr. Agustin Cree aware.

## 2020-04-24 NOTE — Telephone Encounter (Signed)
Patient son called back. He reports actually the patients wrist hasn't gotten better. It hasn't gotten worse but hasn't gotten better either there is still swelling and bruising. He also wanted to know more about the recent lab work we did. I informed him Dr. Wendy Poet note stated normal however liver labs were elevated I advised the son to have the patient check with pcp on the liver labs. He verbally understood. I will consult with Dr. Geraldo Pitter for any further recommendations.

## 2020-04-25 ENCOUNTER — Other Ambulatory Visit: Payer: Self-pay | Admitting: Cardiology

## 2020-04-25 NOTE — Telephone Encounter (Signed)
LMTCB to see how pt's wrist is doing today.

## 2020-04-25 NOTE — Telephone Encounter (Signed)
Age 86, weight 75.8kg, SCr 1.46 on 04/16/20, afib indication, last OV June 2021.

## 2020-04-28 NOTE — Telephone Encounter (Signed)
Left message for [name] patient's son per dpr to return call.

## 2020-04-28 NOTE — Telephone Encounter (Signed)
Left message for Curtis Ramos to return call

## 2020-04-28 NOTE — Telephone Encounter (Signed)
Called patient's son david back. He reports now the patients wrist doesn't have the blue color that it did and swelling is going down it looks to be just a knot now. Patient denies pain or discomfort at the sight. Will consult with Dr. Agustin Cree.

## 2020-04-28 NOTE — Telephone Encounter (Signed)
Son of the patient called back returning Hayley's call.

## 2020-04-28 NOTE — Telephone Encounter (Signed)
[name] is returning Hayley's call.

## 2020-04-29 NOTE — Telephone Encounter (Signed)
Spoke to the patients son just now and we got the patient scheduled to be seen on 05/01/20 at 9:40am. He verbalizes understanding of this and thanks me for the call back.    Encouraged patient to call back with any questions or concerns.

## 2020-04-29 NOTE — Telephone Encounter (Signed)
Can we bring him here thisor next week so we can taka a look at this please

## 2020-04-30 DIAGNOSIS — Z139 Encounter for screening, unspecified: Secondary | ICD-10-CM | POA: Diagnosis not present

## 2020-04-30 DIAGNOSIS — R5383 Other fatigue: Secondary | ICD-10-CM | POA: Diagnosis not present

## 2020-04-30 DIAGNOSIS — Z9181 History of falling: Secondary | ICD-10-CM | POA: Diagnosis not present

## 2020-04-30 DIAGNOSIS — Z79899 Other long term (current) drug therapy: Secondary | ICD-10-CM | POA: Diagnosis not present

## 2020-04-30 DIAGNOSIS — E890 Postprocedural hypothyroidism: Secondary | ICD-10-CM | POA: Diagnosis not present

## 2020-05-01 ENCOUNTER — Other Ambulatory Visit: Payer: Self-pay

## 2020-05-01 ENCOUNTER — Encounter: Payer: Self-pay | Admitting: Cardiology

## 2020-05-01 ENCOUNTER — Ambulatory Visit: Payer: Medicare Other | Admitting: Cardiology

## 2020-05-01 VITALS — BP 124/70 | HR 60 | Ht 71.0 in | Wt 167.8 lb

## 2020-05-01 DIAGNOSIS — H353221 Exudative age-related macular degeneration, left eye, with active choroidal neovascularization: Secondary | ICD-10-CM | POA: Diagnosis not present

## 2020-05-01 DIAGNOSIS — T81718D Complication of other artery following a procedure, not elsewhere classified, subsequent encounter: Secondary | ICD-10-CM

## 2020-05-01 DIAGNOSIS — I420 Dilated cardiomyopathy: Secondary | ICD-10-CM | POA: Diagnosis not present

## 2020-05-01 DIAGNOSIS — I5043 Acute on chronic combined systolic (congestive) and diastolic (congestive) heart failure: Secondary | ICD-10-CM

## 2020-05-01 DIAGNOSIS — I480 Paroxysmal atrial fibrillation: Secondary | ICD-10-CM | POA: Diagnosis not present

## 2020-05-01 DIAGNOSIS — R0989 Other specified symptoms and signs involving the circulatory and respiratory systems: Secondary | ICD-10-CM | POA: Diagnosis not present

## 2020-05-01 DIAGNOSIS — I729 Aneurysm of unspecified site: Secondary | ICD-10-CM

## 2020-05-01 NOTE — Progress Notes (Signed)
Cardiology Office Note:    Date:  05/01/2020   ID:  Curtis Ramos, DOB May 17, 1932, MRN [id number]  PCP:  Cyndi Bender, PA-C  Cardiologist:  Jenne Campus, MD    Referring MD: Cyndi Bender, PA-C   Chief Complaint  Patient presents with  . Follow-up  M doing well  History of Present Illness:    Curtis Ramos is a 86 y.o. male with past medical history significant for cardiomyopathy which is nonischemic in origin.  Recently he had cardiac catheterization which showed nonobstructive lesion.  As a complication of cardiac catheterization he does have small radial artery pseudoaneurysm.  They watching this very carefully is not large and it does not bother him at all.  We are managing this so far conservatively.  Overall he is doing well he described to have fatigue and tiredness and recently was discovered that he did not take his Synthroid and he used to take 175 mcg.  Just Friday he was put back on the medication he took another dose yesterday.  Hopefully he will better with that.  No palpitations no swelling of lower extremities.  Just fatigue and tiredness.  Past Medical History:  Diagnosis Date  . Acute on chronic combined systolic and diastolic CHF (congestive heart failure) ([place]) 03/19/2019  . Arthralgia 11/04/2014  . Arthritis    "proabably"  . Atypical mycobacterium infection   . Cancer [facility])    family unaware of this hx on 12/26/2014  . Decreased oral intake 12/26/2014  . Depressed left ventricular ejection fraction 02/05/2020  . Dilated cardiomyopathy ([place]) 03/19/2019   Ejection fraction 20 to 25% based on echocardiogram done by pulmonologist month ago.  Marland Kitchen DJD (degenerative joint disease) 10/07/2014  . Dyspnea on exertion 03/19/2019  . Essential hypertension 02/05/2020  . GERD (gastroesophageal reflux disease)   . Hammer toes of both feet 01/30/2018  . History of thyroid cancer 12/16/2016  . Hypercalcemia 12/26/2014  . Hypothyroidism   . Hypothyroidism associated with surgical  procedure 10/07/2014  . Lung mass   . Myalgia 11/04/2014  . Mycobacterium avium complex ([place])   . Nail dystrophy 01/30/2018  . Occult malignancy ([place])   . PAF (paroxysmal atrial fibrillation) ([place]) 02/05/2020  . Persistent atrial fibrillation ([place]) 03/19/2019   Chads 2 Vascor equals 3  . PONV (postoperative nausea and vomiting)   . Pre-ulcerative calluses 01/30/2018  . Sensorineural hearing loss (SNHL), bilateral 08/11/2018  . Shingles   . Toenail fungus 01/30/2018  . Wears glasses     Past Surgical History:  Procedure Laterality Date  . APPENDECTOMY    . CATARACT EXTRACTION W/ INTRAOCULAR LENS  IMPLANT, BILATERAL Bilateral   . COLONOSCOPY    . INGUINAL HERNIA REPAIR Left   . LEFT HEART CATH AND CORONARY ANGIOGRAPHY N/A 03/11/2020   Procedure: LEFT HEART CATH AND CORONARY ANGIOGRAPHY;  Surgeon: Jettie Booze, MD;  Location: [place] CV [facility];  Service: Cardiovascular;  Laterality: N/A;  . MASS EXCISION Right 08/27/2014   Procedure: EXCISION MASS RIGHT PALM/INDEX;  Surgeon: Leanora Cover, MD;  Location: [place];  Service: Orthopedics;  Laterality: Right;  . THYROIDECTOMY  2003    Current Medications: Current Meds  Medication Sig  . carvedilol (COREG) 6.25 MG tablet TAKE 1 TABLET BY MOUTH TWICE A DAY  . ELIQUIS 5 MG TABS tablet TAKE 1 TABLET BY MOUTH TWICE A DAY  . furosemide (LASIX) 40 MG tablet TAKE 1 TABLET BY MOUTH EVERY DAY  . latanoprost (XALATAN) 0.005 % ophthalmic solution  Place 1 drop into both eyes at bedtime.   Marland Kitchen levothyroxine (SYNTHROID) 150 MCG tablet Take 150 mcg by mouth daily.  . montelukast (SINGULAIR) 10 MG tablet Take 10 mg by mouth at bedtime.  . Multiple Vitamins-Minerals (PRESERVISION AREDS 2 PO) Take 1 capsule by mouth 2 (two) times daily.  . sacubitril-valsartan (ENTRESTO) 49-51 MG Take 0.5 tablets by mouth 2 (two) times daily.  Marland Kitchen spironolactone (ALDACTONE) 25 MG tablet Take 0.5 tablets (12.5 mg total) by mouth daily.     Allergies:    Patient has no known allergies.   Social History   Socioeconomic History  . Marital status: Married    Spouse name: Not on file  . Number of children: Not on file  . Years of education: Not on file  . Highest education level: Not on file  Occupational History  . Not on file  Tobacco Use  . Smoking status: Never Smoker  . Smokeless tobacco: Never Used  Substance and Sexual Activity  . Alcohol use: No  . Drug use: No  . Sexual activity: Not on file  Other Topics Concern  . Not on file  Social History Narrative  . Not on file   Social Determinants of Health   Financial Resource Strain:   . Difficulty of Paying Living Expenses:   Food Insecurity:   . Worried About Charity fundraiser in the Last Year:   . Arboriculturist in the Last Year:   Transportation Needs:   . Film/video editor (Medical):   Marland Kitchen Lack of Transportation (Non-Medical):   Physical Activity:   . Days of Exercise per Week:   . Minutes of Exercise per Session:   Stress:   . Feeling of Stress :   Social Connections:   . Frequency of Communication with Friends and Family:   . Frequency of Social Gatherings with Friends and Family:   . Attends Religious Services:   . Active Member of Clubs or Organizations:   . Attends Archivist Meetings:   Marland Kitchen Marital Status:      Family History: The patient's family history is not on file. ROS:   Please see the history of present illness.    All 14 point review of systems negative except as described per history of present illness  EKGs/Labs/Other Studies Reviewed:      Recent Labs: 09/20/2019: NT-Pro BNP 3,376 10/22/2019: TSH 7.700 04/16/2020: ALT 228; BUN 23; Creatinine, Ser 1.46; Hemoglobin 10.1; Platelets 156; Potassium 4.0; Sodium 140  Recent Lipid Panel No results found for: CHOL, TRIG, HDL, CHOLHDL, VLDL, LDLCALC, LDLDIRECT  Physical Exam:    VS:  BP 124/70 (BP Location: Right Arm, Patient Position: Sitting, Cuff Size: Normal)   Pulse 60    Ht _0  (1.803 m)   Wt 167 lb 12.8 oz (76.1 kg)   BMI 23.40 kg/m     Wt Readings from Last 3 Encounters:  05/01/20 167 lb 12.8 oz (76.1 kg)  04/16/20 167 lb (75.8 kg)  03/11/20 165 lb (74.8 kg)     GEN:  Well nourished, well developed in no acute distress HEENT: Normal NECK: No JVD; No carotid bruits LYMPHATICS: No lymphadenopathy CARDIAC: Irregularly irregular, no murmurs, no rubs, no gallops RESPIRATORY:  Clear to auscultation without rales, wheezing or rhonchi  ABDOMEN: Soft, non-tender, non-distended MUSCULOSKELETAL:  No edema; No deformity  SKIN: Warm and dry LOWER EXTREMITIES: no swelling NEUROLOGIC:  Alert and oriented x 3 PSYCHIATRIC:  Normal affect  Right wrist there  is about 1.5 over the half centimeter lump which is pulsating.  There is no bruising around it.  We did not get any larger from compared to last time.  ASSESSMENT:    1. Pseudoaneurysm following procedure ([place])   2. PAF (paroxysmal atrial fibrillation) ([place])   3. Acute on chronic combined systolic and diastolic CHF (congestive heart failure) ([place])   4. Depressed left ventricular ejection fraction   5. Dilated cardiomyopathy ([place])    PLAN:    In order of problems listed above:  1. Pseudoaneurysm following cardiac catheterization.  Not enlarging.  Does not give him any symptoms for now we will do conservative approach.  Next week I will schedule him to have ultrasounds of the lesion and then will decide if we need to do anything about that he may require some compression of the pseudoaneurysm. 2. Permanent atrial fibrillation.  Rate control he is anticoagulated which I will continue. 3. Congestive heart failure seems to be compensated on appropriate medications which I will continue. 4. Hypothyroidism.  Recently he is being discovered that he did not take his Synthroid.  He is back on the medication helpful make him feel better.  I will have to watch his heart rate very carefully as well.   Medication  Adjustments/Labs and Tests Ordered: Current medicines are reviewed at length with the patient today.  Concerns regarding medicines are outlined above.  Orders Placed This Encounter  Procedures  . VAS [place] UPPER EXTREMITY ARTERIAL DUPLEX   Medication changes: No orders of the defined types were placed in this encounter.   Signed, Park Liter, MD, [facility] 05/01/2020 10:15 AM    [place]

## 2020-05-01 NOTE — Patient Instructions (Signed)
Medication Instructions:  Your physician recommends that you continue on your current medications as directed. Please refer to the Current Medication list given to you today.  *If you need a refill on your cardiac medications before your next appointment, please call your pharmacy*   Lab Work: None.  If you have labs (blood work) drawn today and your tests are completely normal, you will receive your results only by: Marland Kitchen MyChart Message (if you have MyChart) OR . A paper copy in the mail If you have any lab test that is abnormal or we need to change your treatment, we will call you to review the results.   Testing/Procedures: Your physician has requested that you have a upper extremity arterial duplex. During this test, ultrasound is used to evaluate arterial blood flow in the arm. Allow one hour for this exam. There are no restrictions or special instructions.     Follow-Up: At [facility], you and your health needs are our priority.  As part of our continuing mission to provide you with exceptional heart care, we have created designated Provider Care Teams.  These Care Teams include your primary Cardiologist (physician) and Advanced Practice Providers (APPs -  Physician Assistants and Nurse Practitioners) who all work together to provide you with the care you need, when you need it.  We recommend signing up for the patient portal called "MyChart".  Sign up information is provided on this After Visit Summary.  MyChart is used to connect with patients for Virtual Visits (Telemedicine).  Patients are able to view lab/test results, encounter notes, upcoming appointments, etc.  Non-urgent messages can be sent to your provider as well.   To learn more about what you can do with MyChart, go to [email or web address].    Your next appointment:   1 month(s)  The format for your next appointment:   In Person  Provider:   Jenne Campus, MD   Other Instructions

## 2020-05-05 DIAGNOSIS — Z9181 History of falling: Secondary | ICD-10-CM | POA: Diagnosis not present

## 2020-05-05 DIAGNOSIS — Z0000 Encounter for general adult medical examination without abnormal findings: Secondary | ICD-10-CM | POA: Diagnosis not present

## 2020-05-07 ENCOUNTER — Ambulatory Visit: Payer: Medicare Other | Admitting: Cardiology

## 2020-05-08 DIAGNOSIS — H6121 Impacted cerumen, right ear: Secondary | ICD-10-CM | POA: Diagnosis not present

## 2020-05-08 DIAGNOSIS — H6983 Other specified disorders of Eustachian tube, bilateral: Secondary | ICD-10-CM | POA: Diagnosis not present

## 2020-05-08 DIAGNOSIS — Z6823 Body mass index (BMI) 23.0-23.9, adult: Secondary | ICD-10-CM | POA: Diagnosis not present

## 2020-05-14 ENCOUNTER — Other Ambulatory Visit: Payer: Self-pay

## 2020-05-14 ENCOUNTER — Ambulatory Visit (INDEPENDENT_AMBULATORY_CARE_PROVIDER_SITE_OTHER): Payer: Medicare Other

## 2020-05-14 DIAGNOSIS — T81718A Complication of other artery following a procedure, not elsewhere classified, initial encounter: Secondary | ICD-10-CM

## 2020-05-14 DIAGNOSIS — R748 Abnormal levels of other serum enzymes: Secondary | ICD-10-CM | POA: Diagnosis not present

## 2020-05-14 DIAGNOSIS — I97610 Postprocedural hemorrhage and hematoma of a circulatory system organ or structure following a cardiac catheterization: Secondary | ICD-10-CM

## 2020-05-14 DIAGNOSIS — R7989 Other specified abnormal findings of blood chemistry: Secondary | ICD-10-CM | POA: Diagnosis not present

## 2020-05-14 DIAGNOSIS — I729 Aneurysm of unspecified site: Secondary | ICD-10-CM | POA: Diagnosis not present

## 2020-05-14 NOTE — Progress Notes (Signed)
Unilateral right Upper extremity arterial duplex exam performed.  Jimmy Hattye Siegfried RDCS, RVT

## 2020-05-24 ENCOUNTER — Other Ambulatory Visit: Payer: Self-pay | Admitting: Cardiology

## 2020-05-29 DIAGNOSIS — H353221 Exudative age-related macular degeneration, left eye, with active choroidal neovascularization: Secondary | ICD-10-CM | POA: Diagnosis not present

## 2020-06-05 ENCOUNTER — Encounter: Payer: Self-pay | Admitting: Cardiology

## 2020-06-05 ENCOUNTER — Other Ambulatory Visit: Payer: Self-pay

## 2020-06-05 ENCOUNTER — Ambulatory Visit: Payer: Medicare Other | Admitting: Cardiology

## 2020-06-05 VITALS — BP 120/72 | HR 50 | Ht 71.0 in | Wt 154.0 lb

## 2020-06-05 DIAGNOSIS — I5022 Chronic systolic (congestive) heart failure: Secondary | ICD-10-CM | POA: Diagnosis not present

## 2020-06-05 DIAGNOSIS — I420 Dilated cardiomyopathy: Secondary | ICD-10-CM

## 2020-06-05 DIAGNOSIS — T81718A Complication of other artery following a procedure, not elsewhere classified, initial encounter: Secondary | ICD-10-CM

## 2020-06-05 DIAGNOSIS — I4819 Other persistent atrial fibrillation: Secondary | ICD-10-CM | POA: Diagnosis not present

## 2020-06-05 DIAGNOSIS — I97630 Postprocedural hematoma of a circulatory system organ or structure following a cardiac catheterization: Secondary | ICD-10-CM

## 2020-06-05 DIAGNOSIS — I729 Aneurysm of unspecified site: Secondary | ICD-10-CM

## 2020-06-05 DIAGNOSIS — R0600 Dyspnea, unspecified: Secondary | ICD-10-CM

## 2020-06-05 DIAGNOSIS — I10 Essential (primary) hypertension: Secondary | ICD-10-CM

## 2020-06-05 DIAGNOSIS — R0609 Other forms of dyspnea: Secondary | ICD-10-CM

## 2020-06-05 NOTE — Progress Notes (Signed)
Cardiology Office Note:    Date:  06/05/2020   ID:  Curtis GRIEP, DOB 12-May-1932, MRN [id number]  PCP:  Leonides Sake, MD  Cardiologist:  Jenne Campus, MD    Referring MD: Cyndi Bender, PA-C   No chief complaint on file. Doing very well  History of Present Illness:    Curtis Ramos is a 86 y.o. male with past medical history significant for cardiomyopathy which is nonischemic in origin, cardiac catheterization showed normal coronaries, after cardiac catheterization he had small pseudoaneurysm which likely is getting much better.  Also paroxysmal atrial fibrillation.  He comes today 2 months for follow-up overall he is doing great still continue to work.  Denies have any chest pain tightness squeezing pressure burning chest he tells me within the last few weeks he is feeling better.  Past Medical History:  Diagnosis Date  . Acute on chronic combined systolic and diastolic CHF (congestive heart failure) ([place]) 03/19/2019  . Arthralgia 11/04/2014  . Arthritis    "proabably"  . Atypical mycobacterium infection   . Cancer [facility])    family unaware of this hx on 12/26/2014  . Decreased oral intake 12/26/2014  . Depressed left ventricular ejection fraction 02/05/2020  . Dilated cardiomyopathy ([place]) 03/19/2019   Ejection fraction 20 to 25% based on echocardiogram done by pulmonologist month ago.  Marland Kitchen DJD (degenerative joint disease) 10/07/2014  . Dyspnea on exertion 03/19/2019  . Essential hypertension 02/05/2020  . GERD (gastroesophageal reflux disease)   . Hammer toes of both feet 01/30/2018  . History of thyroid cancer 12/16/2016  . Hypercalcemia 12/26/2014  . Hypothyroidism   . Hypothyroidism associated with surgical procedure 10/07/2014  . Lung mass   . Myalgia 11/04/2014  . Mycobacterium avium complex ([place])   . Nail dystrophy 01/30/2018  . Occult malignancy ([place])   . PAF (paroxysmal atrial fibrillation) ([place]) 02/05/2020  . Persistent atrial fibrillation ([place]) 03/19/2019   Chads 2  Vascor equals 3  . PONV (postoperative nausea and vomiting)   . Pre-ulcerative calluses 01/30/2018  . Sensorineural hearing loss (SNHL), bilateral 08/11/2018  . Shingles   . Toenail fungus 01/30/2018  . Wears glasses     Past Surgical History:  Procedure Laterality Date  . APPENDECTOMY    . CATARACT EXTRACTION W/ INTRAOCULAR LENS  IMPLANT, BILATERAL Bilateral   . COLONOSCOPY    . INGUINAL HERNIA REPAIR Left   . LEFT HEART CATH AND CORONARY ANGIOGRAPHY N/A 03/11/2020   Procedure: LEFT HEART CATH AND CORONARY ANGIOGRAPHY;  Surgeon: Jettie Booze, MD;  Location: [place] [facility];  Service: Cardiovascular;  Laterality: N/A;  . MASS EXCISION Right 08/27/2014   Procedure: EXCISION MASS RIGHT PALM/INDEX;  Surgeon: Leanora Cover, MD;  Location: [place];  Service: Orthopedics;  Laterality: Right;  . THYROIDECTOMY  2003    Current Medications: Current Meds  Medication Sig  . carvedilol (COREG) 6.25 MG tablet TAKE 1 TABLET BY MOUTH TWICE A DAY  . ELIQUIS 5 MG TABS tablet TAKE 1 TABLET BY MOUTH TWICE A DAY  . furosemide (LASIX) 40 MG tablet TAKE 1 TABLET BY MOUTH EVERY DAY  . latanoprost (XALATAN) 0.005 % ophthalmic solution Place 1 drop into both eyes at bedtime.   Marland Kitchen levothyroxine (SYNTHROID) 150 MCG tablet Take 150 mcg by mouth daily.  . montelukast (SINGULAIR) 10 MG tablet Take 10 mg by mouth at bedtime.  . Multiple Vitamins-Minerals (PRESERVISION AREDS 2 PO) Take 1 capsule by mouth 2 (two) times daily.  . sacubitril-valsartan (  ENTRESTO) 49-51 MG Take 0.5 tablets by mouth 2 (two) times daily.  Marland Kitchen spironolactone (ALDACTONE) 25 MG tablet TAKE 1/2 TABLET BY MOUTH EVERY DAY     Allergies:   Patient has no known allergies.   Social History   Socioeconomic History  . Marital status: Married    Spouse name: Not on file  . Number of children: Not on file  . Years of education: Not on file  . Highest education level: Not on file  Occupational History  . Not on file    Tobacco Use  . Smoking status: Never Smoker  . Smokeless tobacco: Never Used  Substance and Sexual Activity  . Alcohol use: No  . Drug use: No  . Sexual activity: Not on file  Other Topics Concern  . Not on file  Social History Narrative  . Not on file   Social Determinants of Health   Financial Resource Strain:   . Difficulty of Paying Living Expenses:   Food Insecurity:   . Worried About Charity fundraiser in the Last Year:   . Arboriculturist in the Last Year:   Transportation Needs:   . Film/video editor (Medical):   Marland Kitchen Lack of Transportation (Non-Medical):   Physical Activity:   . Days of Exercise per Week:   . Minutes of Exercise per Session:   Stress:   . Feeling of Stress :   Social Connections:   . Frequency of Communication with Friends and Family:   . Frequency of Social Gatherings with Friends and Family:   . Attends Religious Services:   . Active Member of Clubs or Organizations:   . Attends Archivist Meetings:   Marland Kitchen Marital Status:      Family History: The patient's family history is not on file. ROS:   Please see the history of present illness.    All 14 point review of systems negative except as described per history of present illness  EKGs/Labs/Other Studies Reviewed:      Recent Labs: 09/20/2019: NT-Pro BNP 3,376 10/22/2019: TSH 7.700 04/16/2020: ALT 228; BUN 23; Creatinine, Ser 1.46; Hemoglobin 10.1; Platelets 156; Potassium 4.0; Sodium 140  Recent Lipid Panel No results found for: CHOL, TRIG, HDL, CHOLHDL, VLDL, LDLCALC, LDLDIRECT  Physical Exam:    VS:  BP 120/72 (BP Location: Left Arm, Patient Position: Sitting, Cuff Size: Normal)   Pulse 50   Ht _0  (1.803 m)   Wt 154 lb (69.9 kg)   SpO2 90%   BMI 21.48 kg/m     Wt Readings from Last 3 Encounters:  06/05/20 154 lb (69.9 kg)  05/01/20 167 lb 12.8 oz (76.1 kg)  04/16/20 167 lb (75.8 kg)     GEN:  Well nourished, well developed in no acute distress HEENT:  Normal NECK: No JVD; No carotid bruits LYMPHATICS: No lymphadenopathy CARDIAC: RRR, no murmurs, no rubs, no gallops RESPIRATORY:  Clear to auscultation without rales, wheezing or rhonchi  ABDOMEN: Soft, non-tender, non-distended MUSCULOSKELETAL:  No edema; No deformity  SKIN: Warm and dry LOWER EXTREMITIES: no swelling NEUROLOGIC:  Alert and oriented x 3 PSYCHIATRIC:  Normal affect   ASSESSMENT:    1. Pseudoaneurysm following procedure ([place])   2. Persistent atrial fibrillation ([place])   3. Essential hypertension   4. Dilated cardiomyopathy ([place])   5. Chronic systolic heart failure ([place])   6. Dyspnea on exertion    PLAN:    In order of problems listed above:  1. Pseudoaneurysm following  cardiac catheterization almost completely gone.  About 6 to 7 mm in length right now. 2. Persistent atrial fibrillation.  Rate controlled anticoagulated which I will continue. 3. Essential hypertension blood pressure well controlled continue present management. 4. Dilated cardiomyopathy on guideline directed medical therapy.  We initiated conversation again about ICD he said he feels fine he does not want to do anything about it this discussion will continue. 5. Dyspnea on exertion: Denies having any   Medication Adjustments/Labs and Tests Ordered: Current medicines are reviewed at length with the patient today.  Concerns regarding medicines are outlined above.  No orders of the defined types were placed in this encounter.  Medication changes: No orders of the defined types were placed in this encounter.   Signed, Park Liter, MD, [facility] 06/05/2020 4:56 PM    [place]

## 2020-06-05 NOTE — Patient Instructions (Signed)

## 2020-06-11 DIAGNOSIS — D539 Nutritional anemia, unspecified: Secondary | ICD-10-CM | POA: Diagnosis not present

## 2020-06-11 DIAGNOSIS — E890 Postprocedural hypothyroidism: Secondary | ICD-10-CM | POA: Diagnosis not present

## 2020-06-24 ENCOUNTER — Other Ambulatory Visit: Payer: Self-pay | Admitting: Cardiology

## 2020-06-25 ENCOUNTER — Ambulatory Visit: Payer: Medicare Other | Admitting: Cardiology

## 2020-06-26 DIAGNOSIS — H353221 Exudative age-related macular degeneration, left eye, with active choroidal neovascularization: Secondary | ICD-10-CM | POA: Diagnosis not present

## 2020-07-01 ENCOUNTER — Other Ambulatory Visit: Payer: Self-pay | Admitting: Cardiology

## 2020-07-10 ENCOUNTER — Ambulatory Visit (INDEPENDENT_AMBULATORY_CARE_PROVIDER_SITE_OTHER): Payer: Medicare Other

## 2020-07-10 ENCOUNTER — Ambulatory Visit: Payer: Medicare Other | Admitting: Podiatry

## 2020-07-10 ENCOUNTER — Other Ambulatory Visit: Payer: Self-pay | Admitting: Podiatry

## 2020-07-10 ENCOUNTER — Other Ambulatory Visit: Payer: Self-pay

## 2020-07-10 DIAGNOSIS — M79671 Pain in right foot: Secondary | ICD-10-CM

## 2020-07-10 DIAGNOSIS — M6240 Contracture of muscle, unspecified site: Secondary | ICD-10-CM

## 2020-07-10 DIAGNOSIS — M2041 Other hammer toe(s) (acquired), right foot: Secondary | ICD-10-CM | POA: Diagnosis not present

## 2020-07-14 ENCOUNTER — Other Ambulatory Visit: Payer: Self-pay | Admitting: Podiatry

## 2020-07-14 DIAGNOSIS — M2041 Other hammer toe(s) (acquired), right foot: Secondary | ICD-10-CM

## 2020-07-17 NOTE — Progress Notes (Signed)
  Subjective:  Patient ID: Curtis Ramos, male    DOB: 25-Nov-1931,  MRN: [id number]  No chief complaint on file.   86 y.o. male presents with pain to the right second toe.  Describes abnormal curving and pain of the digit.  Present for 8 to 9 months.  Pain rated 7 out of 10.  Denies redness and swelling.  Denies prior treatments.  Objective:  Physical Exam: warm, good capillary refill, no trophic changes or ulcerative lesions, normal DP and PT pulses and normal sensory exam. Right Foot: right 2nd toe semi-rigid contracture.  Assessment:   1. Hammertoe of right foot   2. Contracture of tendon sheath    Plan:  Patient was evaluated and treated and all questions answered.  Hammertoe right 2nd toe with painful callus -Flexor tenotomy as below. -Advised to remove the dressing in 24 hours and apply a band-aid and triple abx ointment every day thereafter.  Procedure: Flexor Tenotomy Indication for Procedure: toe with semi-reducible hammertoe with distal tip ulceration. Flexor tenotomy indicated to alleviate contracture, reduce pressure, and enhance healing of the ulceration. Location: right, 2nd toe Anesthesia: Lidocaine 1% plain; 1.5 mL and Marcaine 0.5% plain; 1.5 mL digital block Instrumentation: 18 g needle  Technique: The toe was anesthetized as above and prepped in the usual fashion. The toe was exsanquinated and a tourniquet was secured at the base of the toe. An 18g needle was then used to percutaneously release the flexor tendon at the plantar surface of the toe with noted release of the hammertoe deformity. The incision was then dressed with antibiotic ointment and band-aid. Compression splint dressing applied. Patient tolerated the procedure well. Dressing: Dry, sterile, compression dressing. Disposition: Patient tolerated procedure well. Patient to return in 1 week for follow-up.       No follow-ups on file.

## 2020-07-23 DIAGNOSIS — Z6821 Body mass index (BMI) 21.0-21.9, adult: Secondary | ICD-10-CM | POA: Diagnosis not present

## 2020-07-23 DIAGNOSIS — R634 Abnormal weight loss: Secondary | ICD-10-CM | POA: Diagnosis not present

## 2020-07-23 DIAGNOSIS — D539 Nutritional anemia, unspecified: Secondary | ICD-10-CM | POA: Diagnosis not present

## 2020-07-23 DIAGNOSIS — M62838 Other muscle spasm: Secondary | ICD-10-CM | POA: Diagnosis not present

## 2020-07-23 DIAGNOSIS — E890 Postprocedural hypothyroidism: Secondary | ICD-10-CM | POA: Diagnosis not present

## 2020-07-24 ENCOUNTER — Ambulatory Visit: Payer: Medicare Other | Admitting: Podiatry

## 2020-07-31 DIAGNOSIS — H353221 Exudative age-related macular degeneration, left eye, with active choroidal neovascularization: Secondary | ICD-10-CM | POA: Diagnosis not present

## 2020-08-04 ENCOUNTER — Other Ambulatory Visit: Payer: Self-pay

## 2020-08-04 ENCOUNTER — Encounter: Payer: Self-pay | Admitting: Podiatry

## 2020-08-04 ENCOUNTER — Ambulatory Visit (INDEPENDENT_AMBULATORY_CARE_PROVIDER_SITE_OTHER): Payer: Medicare Other | Admitting: Podiatry

## 2020-08-04 DIAGNOSIS — M2041 Other hammer toe(s) (acquired), right foot: Secondary | ICD-10-CM

## 2020-08-04 DIAGNOSIS — Z9889 Other specified postprocedural states: Secondary | ICD-10-CM

## 2020-08-04 NOTE — Progress Notes (Signed)
  Subjective:  Patient ID: Curtis Ramos, male    DOB: 10-19-1932,  MRN: [id number]  Chief Complaint  Patient presents with  . Foot Problem    i am doing better on the 2nd toe right foot and the 3rd toe right is doing good and had it done last year    86 y.o. male presents with pain to the right second toe. States the toe is feeling better but it is still a little bent.  Objective:  Physical Exam: warm, good capillary refill, no trophic changes or ulcerative lesions, normal DP and PT pulses and normal sensory exam.  Right Foot: right 2nd toe more rectus but with rigid PIPJ contracture. Distal callus evident. Assessment:   1. Hammertoe of right foot   2. Post-operative state    Plan:  Patient was evaluated and treated and all questions answered.  Hammertoe right 2nd toe with painful callus -Improved s/p flexor tenotomy. Still has some PIPJ rigid contracture. Discussed should pain persist he would need surgical intervention with hammertoe correction. Patient not interested at this time. -F/u as needed.     No follow-ups on file.

## 2020-09-30 ENCOUNTER — Other Ambulatory Visit: Payer: Self-pay | Admitting: Cardiology

## 2020-10-13 DIAGNOSIS — M1990 Unspecified osteoarthritis, unspecified site: Secondary | ICD-10-CM | POA: Insufficient documentation

## 2020-10-13 DIAGNOSIS — R918 Other nonspecific abnormal finding of lung field: Secondary | ICD-10-CM | POA: Insufficient documentation

## 2020-10-13 DIAGNOSIS — Z973 Presence of spectacles and contact lenses: Secondary | ICD-10-CM | POA: Insufficient documentation

## 2020-10-13 DIAGNOSIS — B029 Zoster without complications: Secondary | ICD-10-CM | POA: Insufficient documentation

## 2020-10-13 DIAGNOSIS — Z9889 Other specified postprocedural states: Secondary | ICD-10-CM | POA: Insufficient documentation

## 2020-10-13 DIAGNOSIS — K219 Gastro-esophageal reflux disease without esophagitis: Secondary | ICD-10-CM | POA: Insufficient documentation

## 2020-10-13 DIAGNOSIS — A310 Pulmonary mycobacterial infection: Secondary | ICD-10-CM | POA: Insufficient documentation

## 2020-10-13 DIAGNOSIS — E039 Hypothyroidism, unspecified: Secondary | ICD-10-CM | POA: Insufficient documentation

## 2020-10-13 DIAGNOSIS — R112 Nausea with vomiting, unspecified: Secondary | ICD-10-CM | POA: Insufficient documentation

## 2020-10-13 DIAGNOSIS — C801 Malignant (primary) neoplasm, unspecified: Secondary | ICD-10-CM | POA: Insufficient documentation

## 2020-10-15 ENCOUNTER — Other Ambulatory Visit: Payer: Self-pay

## 2020-10-15 ENCOUNTER — Ambulatory Visit: Payer: Medicare Other | Admitting: Cardiology

## 2020-10-15 ENCOUNTER — Encounter: Payer: Self-pay | Admitting: Cardiology

## 2020-10-15 VITALS — BP 138/78 | HR 98 | Ht 71.0 in | Wt 145.0 lb

## 2020-10-15 DIAGNOSIS — I4821 Permanent atrial fibrillation: Secondary | ICD-10-CM

## 2020-10-15 DIAGNOSIS — I729 Aneurysm of unspecified site: Secondary | ICD-10-CM

## 2020-10-15 DIAGNOSIS — I420 Dilated cardiomyopathy: Secondary | ICD-10-CM

## 2020-10-15 DIAGNOSIS — T81718D Complication of other artery following a procedure, not elsewhere classified, subsequent encounter: Secondary | ICD-10-CM | POA: Diagnosis not present

## 2020-10-15 DIAGNOSIS — T81718A Complication of other artery following a procedure, not elsewhere classified, initial encounter: Secondary | ICD-10-CM | POA: Diagnosis not present

## 2020-10-15 DIAGNOSIS — I4819 Other persistent atrial fibrillation: Secondary | ICD-10-CM

## 2020-10-15 DIAGNOSIS — I5022 Chronic systolic (congestive) heart failure: Secondary | ICD-10-CM

## 2020-10-15 DIAGNOSIS — I10 Essential (primary) hypertension: Secondary | ICD-10-CM

## 2020-10-15 HISTORY — DX: Permanent atrial fibrillation: I48.21

## 2020-10-15 NOTE — Patient Instructions (Signed)
Medication Instructions:  Your physician recommends that you continue on your current medications as directed. Please refer to the Current Medication list given to you today.  *If you need a refill on your cardiac medications before your next appointment, please call your pharmacy*   Lab Work: Your physician recommends that you return for lab work today: bmp  If you have labs (blood work) drawn today and your tests are completely normal, you will receive your results only by: Marland Kitchen MyChart Message (if you have MyChart) OR . A paper copy in the mail If you have any lab test that is abnormal or we need to change your treatment, we will call you to review the results.   Testing/Procedures: Your physician has requested that you have an echocardiogram. Echocardiography is a painless test that uses sound waves to create images of your heart. It provides your doctor with information about the size and shape of your heart and how well your heart's chambers and valves are working. This procedure takes approximately one hour. There are no restrictions for this procedure.     Follow-Up: At [facility], you and your health needs are our priority.  As part of our continuing mission to provide you with exceptional heart care, we have created designated Provider Care Teams.  These Care Teams include your primary Cardiologist (physician) and Advanced Practice Providers (APPs -  Physician Assistants and Nurse Practitioners) who all work together to provide you with the care you need, when you need it.  We recommend signing up for the patient portal called "MyChart".  Sign up information is provided on this After Visit Summary.  MyChart is used to connect with patients for Virtual Visits (Telemedicine).  Patients are able to view lab/test results, encounter notes, upcoming appointments, etc.  Non-urgent messages can be sent to your provider as well.   To learn more about what you can do with MyChart, go to  [email or web address].    Your next appointment:   6 month(s)  The format for your next appointment:   In Person  Provider:   Jenne Campus, MD   Other Instructions   Echocardiogram An echocardiogram is a procedure that uses painless sound waves (ultrasound) to produce an image of the heart. Images from an echocardiogram can provide important information about:  Signs of coronary artery disease (CAD).  Aneurysm detection. An aneurysm is a weak or damaged part of an artery wall that bulges out from the normal force of blood pumping through the body.  Heart size and shape. Changes in the size or shape of the heart can be associated with certain conditions, including heart failure, aneurysm, and CAD.  Heart muscle function.  Heart valve function.  Signs of a past heart attack.  Fluid buildup around the heart.  Thickening of the heart muscle.  A tumor or infectious growth around the heart valves. Tell a health care provider about:  Any allergies you have.  All medicines you are taking, including vitamins, herbs, eye drops, creams, and over-the-counter medicines.  Any blood disorders you have.  Any surgeries you have had.  Any medical conditions you have.  Whether you are pregnant or may be pregnant. What are the risks? Generally, this is a safe procedure. However, problems may occur, including:  Allergic reaction to dye (contrast) that may be used during the procedure. What happens before the procedure? No specific preparation is needed. You may eat and drink normally. What happens during the procedure?   An IV tube may  be inserted into one of your veins.  You may receive contrast through this tube. A contrast is an injection that improves the quality of the pictures from your heart.  A gel will be applied to your chest.  A wand-like tool (transducer) will be moved over your chest. The gel will help to transmit the sound waves from the  transducer.  The sound waves will harmlessly bounce off of your heart to allow the heart images to be captured in real-time motion. The images will be recorded on a computer. The procedure may vary among health care providers and hospitals. What happens after the procedure?  You may return to your normal, everyday life, including diet, activities, and medicines, unless your health care provider tells you not to do that. Summary  An echocardiogram is a procedure that uses painless sound waves (ultrasound) to produce an image of the heart.  Images from an echocardiogram can provide important information about the size and shape of your heart, heart muscle function, heart valve function, and fluid buildup around your heart.  You do not need to do anything to prepare before this procedure. You may eat and drink normally.  After the echocardiogram is completed, you may return to your normal, everyday life, unless your health care provider tells you not to do that. This information is not intended to replace advice given to you by your health care provider. Make sure you discuss any questions you have with your health care provider. Document Revised: 02/22/2019 Document Reviewed: 12/04/2016 Elsevier Patient Education  [place].

## 2020-10-15 NOTE — Addendum Note (Signed)
Addended by: Senaida Ores on: 10/15/2020 04:12 PM   Modules accepted: Orders

## 2020-10-15 NOTE — Progress Notes (Signed)
Cardiology Office Note:    Date:  10/15/2020   ID:  [name], DOB 08-26-1932, MRN [id number]  PCP:  Leonides Sake, MD  Cardiologist:  Jenne Campus, MD    Referring MD: Leonides Sake, MD   Chief Complaint  Patient presents with  . Follow-up  I am doing very well  History of Present Illness:    Curtis Ramos is a 86 y.o. male with past medical history significant for nonischemic cardiomyopathy, cardiac catheterization in 2020 showed normal coronaries, cardiac catheterization has been complicated by a small pseudoaneurysm involving the right radial artery this is already resolved, permanent atrial fibrillation, comes today to my for follow-up overall he is doing great he still works denies have any chest pain tightness squeezing pressure burning chest no swelling of lower extremities overall doing well.  Past Medical History:  Diagnosis Date  . Acute on chronic combined systolic and diastolic CHF (congestive heart failure) ([place]) 03/19/2019  . AKI (acute kidney injury) ([place]) 12/26/2014  . Arthralgia 11/04/2014  . Arthritis    "proabably"  . Atypical mycobacterium infection   . Cancer [facility])    family unaware of this hx on 12/26/2014  . Chronic systolic heart failure ([place])   . Decreased oral intake 12/26/2014  . Depressed left ventricular ejection fraction 02/05/2020  . Dilated cardiomyopathy ([place]) 03/19/2019   Ejection fraction 20 to 25% based on echocardiogram done by pulmonologist month ago.  Marland Kitchen DJD (degenerative joint disease) 10/07/2014  . Dyspnea on exertion 03/19/2019  . Essential hypertension 02/05/2020  . GERD (gastroesophageal reflux disease)   . Hammer toes of both feet 01/30/2018  . History of thyroid cancer 12/16/2016  . Hypercalcemia 12/26/2014  . Hypothyroidism   . Hypothyroidism associated with surgical procedure 10/07/2014  . Lung mass   . Myalgia 11/04/2014  . Mycobacterium avium complex ([place])   . Nail dystrophy 01/30/2018  . Occult malignancy ([place])   . PAF  (paroxysmal atrial fibrillation) ([place]) 02/05/2020  . Persistent atrial fibrillation ([place]) 03/19/2019   Chads 2 Vascor equals 3  . PONV (postoperative nausea and vomiting)   . Pre-ulcerative calluses 01/30/2018  . Pseudoaneurysm following procedure ([place]) 04/16/2020  . Sensorineural hearing loss (SNHL), bilateral 08/11/2018  . Shingles   . Toenail fungus 01/30/2018  . Wears glasses     Past Surgical History:  Procedure Laterality Date  . APPENDECTOMY    . CATARACT EXTRACTION W/ INTRAOCULAR LENS  IMPLANT, BILATERAL Bilateral   . COLONOSCOPY    . INGUINAL HERNIA REPAIR Left   . LEFT HEART CATH AND CORONARY ANGIOGRAPHY N/A 03/11/2020   Procedure: LEFT HEART CATH AND CORONARY ANGIOGRAPHY;  Surgeon: Jettie Booze, MD;  Location: [place] [facility];  Service: Cardiovascular;  Laterality: N/A;  . MASS EXCISION Right 08/27/2014   Procedure: EXCISION MASS RIGHT PALM/INDEX;  Surgeon: Leanora Cover, MD;  Location: [place];  Service: Orthopedics;  Laterality: Right;  . THYROIDECTOMY  2003    Current Medications: Current Meds  Medication Sig  . carvedilol (COREG) 6.25 MG tablet TAKE 1 TABLET BY MOUTH TWICE A DAY  . ELIQUIS 5 MG TABS tablet TAKE 1 TABLET BY MOUTH TWICE A DAY  . ENTRESTO 49-51 MG TAKE 1 TABLET BY MOUTH TWICE A DAY  . levothyroxine (SYNTHROID) 150 MCG tablet Take 150 mcg by mouth daily.  . montelukast (SINGULAIR) 10 MG tablet Take 10 mg by mouth at bedtime.  . Multiple Vitamins-Minerals (PRESERVISION AREDS 2 PO) Take 1 capsule by mouth 2 (  two) times daily.     Allergies:   Patient has no known allergies.   Social History   Socioeconomic History  . Marital status: Married    Spouse name: Not on file  . Number of children: Not on file  . Years of education: Not on file  . Highest education level: Not on file  Occupational History  . Not on file  Tobacco Use  . Smoking status: Never Smoker  . Smokeless tobacco: Never Used  Substance and Sexual Activity  .  Alcohol use: No  . Drug use: No  . Sexual activity: Not on file  Other Topics Concern  . Not on file  Social History Narrative  . Not on file   Social Determinants of Health   Financial Resource Strain:   . Difficulty of Paying Living Expenses: Not on file  Food Insecurity:   . Worried About Charity fundraiser in the Last Year: Not on file  . Ran Out of Food in the Last Year: Not on file  Transportation Needs:   . Lack of Transportation (Medical): Not on file  . Lack of Transportation (Non-Medical): Not on file  Physical Activity:   . Days of Exercise per Week: Not on file  . Minutes of Exercise per Session: Not on file  Stress:   . Feeling of Stress : Not on file  Social Connections:   . Frequency of Communication with Friends and Family: Not on file  . Frequency of Social Gatherings with Friends and Family: Not on file  . Attends Religious Services: Not on file  . Active Member of Clubs or Organizations: Not on file  . Attends Archivist Meetings: Not on file  . Marital Status: Not on file     Family History: The patient's family history is not on file. ROS:   Please see the history of present illness.    All 14 point review of systems negative except as described per history of present illness  EKGs/Labs/Other Studies Reviewed:      Recent Labs: 10/22/2019: TSH 7.700 04/16/2020: ALT 228; BUN 23; Creatinine, Ser 1.46; Hemoglobin 10.1; Platelets 156; Potassium 4.0; Sodium 140  Recent Lipid Panel No results found for: CHOL, TRIG, HDL, CHOLHDL, VLDL, LDLCALC, LDLDIRECT  Physical Exam:    VS:  BP 138/78 (BP Location: Left Arm, Patient Position: Sitting)   Pulse 98   Ht _0  (1.803 m)   Wt 145 lb (65.8 kg)   SpO2 97%   BMI 20.22 kg/m     Wt Readings from Last 3 Encounters:  10/15/20 145 lb (65.8 kg)  06/05/20 154 lb (69.9 kg)  05/01/20 167 lb 12.8 oz (76.1 kg)     GEN:  Well nourished, well developed in no acute distress HEENT: Normal NECK: No  JVD; No carotid bruits LYMPHATICS: No lymphadenopathy CARDIAC: Irregular irregular, no murmurs, no rubs, no gallops RESPIRATORY:  Clear to auscultation without rales, wheezing or rhonchi  ABDOMEN: Soft, non-tender, non-distended MUSCULOSKELETAL:  No edema; No deformity  SKIN: Warm and dry LOWER EXTREMITIES: no swelling NEUROLOGIC:  Alert and oriented x 3 PSYCHIATRIC:  Normal affect   ASSESSMENT:    1. Pseudoaneurysm following procedure ([place])   2. Permanent atrial fibrillation (HCC)   3. Persistent atrial fibrillation ([place])   4. Essential hypertension   5. Chronic systolic heart failure ([place])   6. Dilated cardiomyopathy ([place])    PLAN:    In order of problems listed above:  1. Pseudoaneurysm following cardiac  catheterization resolved. 2. Permanent atrial fibrillation I brought the issue of potentially going to cardioversion however he said he feels fine he does not need anything. 3. Essential hypertension blood pressure seems to be well controlled continue present medications. 4. Chronic systolic congestive heart failure I will repeat his echocardiogram.  This is to check left ventricle ejection fraction.  Recently had the dose of his Delene Loll has been cut down in half.  Hopefully I will be able to increase dose but I will check an echocardiogram first. 5. Overall he is doing amazingly well again I brought an issue of potentially having cardioversion as well as ICD he does not want to do it he is doing well and he is happy the way he is.   Medication Adjustments/Labs and Tests Ordered: Current medicines are reviewed at length with the patient today.  Concerns regarding medicines are outlined above.  No orders of the defined types were placed in this encounter.  Medication changes: No orders of the defined types were placed in this encounter.   Signed, Park Liter, MD, [facility] 10/15/2020 3:25 PM    [place]

## 2020-10-16 DIAGNOSIS — H353221 Exudative age-related macular degeneration, left eye, with active choroidal neovascularization: Secondary | ICD-10-CM | POA: Diagnosis not present

## 2020-10-16 DIAGNOSIS — Z23 Encounter for immunization: Secondary | ICD-10-CM | POA: Diagnosis not present

## 2020-10-16 LAB — BASIC METABOLIC PANEL
BUN/Creatinine Ratio: 21 (ref 10–24)
BUN: 22 mg/dL (ref 8–27)
CO2: 23 mmol/L (ref 20–29)
Calcium: 9.7 mg/dL (ref 8.6–10.2)
Chloride: 97 mmol/L (ref 96–106)
Creatinine, Ser: 1.03 mg/dL (ref 0.76–1.27)
GFR calc Af Amer: 76 mL/min/{1.73_m2} (ref >59)
GFR calc non Af Amer: 65 mL/min/{1.73_m2} (ref >59)
Glucose: 95 mg/dL (ref 65–99)
Potassium: 4.5 mmol/L (ref 3.5–5.2)
Sodium: 140 mmol/L (ref 134–144)

## 2020-10-20 ENCOUNTER — Telehealth: Payer: Self-pay | Admitting: Emergency Medicine

## 2020-10-20 MED ORDER — ENTRESTO 49-51 MG PO TABS: 1.0000 | ORAL_TABLET | Freq: Two times a day (BID) | ORAL | 3 refills | Status: DC

## 2020-10-20 NOTE — Telephone Encounter (Signed)
     I went in this pt's chart to see who called him on Friday. It was Sharin Grave

## 2020-10-20 NOTE — Addendum Note (Signed)
Addended by: Senaida Ores on: 10/20/2020 09:16 AM   Modules accepted: Orders

## 2020-10-21 DIAGNOSIS — R3911 Hesitancy of micturition: Secondary | ICD-10-CM | POA: Diagnosis not present

## 2020-10-21 DIAGNOSIS — Z6820 Body mass index (BMI) 20.0-20.9, adult: Secondary | ICD-10-CM | POA: Diagnosis not present

## 2020-10-24 ENCOUNTER — Other Ambulatory Visit: Payer: Self-pay | Admitting: Cardiology

## 2020-10-24 NOTE — Telephone Encounter (Signed)
Prescription refill request for Eliquis received. Indication: Atrial Fibrillation Last office visit: 10/2020 Scr: 1.03  10/2020 Age: 86 Weight: 65.8 kg  Prescription refilled

## 2020-11-12 ENCOUNTER — Ambulatory Visit (INDEPENDENT_AMBULATORY_CARE_PROVIDER_SITE_OTHER): Payer: Medicare Other

## 2020-11-12 ENCOUNTER — Other Ambulatory Visit: Payer: Self-pay

## 2020-11-12 DIAGNOSIS — I420 Dilated cardiomyopathy: Secondary | ICD-10-CM

## 2020-11-12 DIAGNOSIS — T81718D Complication of other artery following a procedure, not elsewhere classified, subsequent encounter: Secondary | ICD-10-CM | POA: Diagnosis not present

## 2020-11-12 DIAGNOSIS — I10 Essential (primary) hypertension: Secondary | ICD-10-CM | POA: Diagnosis not present

## 2020-11-12 DIAGNOSIS — T81718A Complication of other artery following a procedure, not elsewhere classified, initial encounter: Secondary | ICD-10-CM

## 2020-11-12 DIAGNOSIS — I4819 Other persistent atrial fibrillation: Secondary | ICD-10-CM

## 2020-11-12 DIAGNOSIS — I4821 Permanent atrial fibrillation: Secondary | ICD-10-CM

## 2020-11-12 DIAGNOSIS — I5022 Chronic systolic (congestive) heart failure: Secondary | ICD-10-CM | POA: Diagnosis not present

## 2020-11-12 DIAGNOSIS — I729 Aneurysm of unspecified site: Secondary | ICD-10-CM

## 2020-11-12 LAB — ECHOCARDIOGRAM COMPLETE
Area-P 1/2: 4.49 cm2
Calc EF: 48.9 %
P 1/2 time: 467 msec
S' Lateral: 3.6 cm
Single Plane A2C EF: 51.9 %
Single Plane A4C EF: 44.3 %

## 2020-11-12 NOTE — Progress Notes (Signed)
Complete echocardiogram performed.  Jimmy Darden Flemister RDCS, RVT  

## 2020-11-13 ENCOUNTER — Telehealth: Payer: Self-pay

## 2020-11-13 NOTE — Telephone Encounter (Signed)
-----   Message from Georgeanna Lea, MD sent at 11/13/2020  8:26 AM EST ----- Ejection fraction normalized on echocardiogram, moderate left ventricle hypertrophy, left atrium is moderately dilated but overall ejection fraction normalized.  It is a great news

## 2020-11-13 NOTE — Telephone Encounter (Signed)
Patient notified of results and verbalized understanding.  

## 2020-11-20 DIAGNOSIS — H353221 Exudative age-related macular degeneration, left eye, with active choroidal neovascularization: Secondary | ICD-10-CM | POA: Diagnosis not present

## 2020-12-25 DIAGNOSIS — H353221 Exudative age-related macular degeneration, left eye, with active choroidal neovascularization: Secondary | ICD-10-CM | POA: Diagnosis not present

## 2020-12-25 DIAGNOSIS — H353112 Nonexudative age-related macular degeneration, right eye, intermediate dry stage: Secondary | ICD-10-CM | POA: Diagnosis not present

## 2020-12-25 DIAGNOSIS — H35373 Puckering of macula, bilateral: Secondary | ICD-10-CM | POA: Diagnosis not present

## 2021-01-15 DIAGNOSIS — D539 Nutritional anemia, unspecified: Secondary | ICD-10-CM | POA: Diagnosis not present

## 2021-01-15 DIAGNOSIS — N1831 Chronic kidney disease, stage 3a: Secondary | ICD-10-CM | POA: Diagnosis not present

## 2021-01-15 DIAGNOSIS — D6869 Other thrombophilia: Secondary | ICD-10-CM | POA: Diagnosis not present

## 2021-01-15 DIAGNOSIS — I420 Dilated cardiomyopathy: Secondary | ICD-10-CM | POA: Diagnosis not present

## 2021-01-15 DIAGNOSIS — E890 Postprocedural hypothyroidism: Secondary | ICD-10-CM | POA: Diagnosis not present

## 2021-01-15 DIAGNOSIS — I4819 Other persistent atrial fibrillation: Secondary | ICD-10-CM | POA: Diagnosis not present

## 2021-01-15 DIAGNOSIS — Z6821 Body mass index (BMI) 21.0-21.9, adult: Secondary | ICD-10-CM | POA: Diagnosis not present

## 2021-01-23 DIAGNOSIS — D485 Neoplasm of uncertain behavior of skin: Secondary | ICD-10-CM | POA: Diagnosis not present

## 2021-02-03 DIAGNOSIS — M1711 Unilateral primary osteoarthritis, right knee: Secondary | ICD-10-CM | POA: Diagnosis not present

## 2021-02-03 DIAGNOSIS — M25561 Pain in right knee: Secondary | ICD-10-CM | POA: Diagnosis not present

## 2021-02-19 DIAGNOSIS — H353221 Exudative age-related macular degeneration, left eye, with active choroidal neovascularization: Secondary | ICD-10-CM | POA: Diagnosis not present

## 2021-03-07 ENCOUNTER — Other Ambulatory Visit: Payer: Self-pay | Admitting: Cardiology

## 2021-03-19 ENCOUNTER — Other Ambulatory Visit: Payer: Self-pay

## 2021-03-19 ENCOUNTER — Ambulatory Visit: Payer: Medicare Other | Admitting: Podiatry

## 2021-03-19 ENCOUNTER — Encounter: Payer: Self-pay | Admitting: Podiatry

## 2021-03-19 DIAGNOSIS — M2041 Other hammer toe(s) (acquired), right foot: Secondary | ICD-10-CM

## 2021-03-19 NOTE — Progress Notes (Signed)
  Subjective:  Patient ID: Curtis Ramos, male    DOB: March 07, 1932,  MRN: [id number]  Chief Complaint  Patient presents with  . Foot Problem    I have some pain on the right foot and I think the hammertoes are back   87 y.o. male presents with pain to the right second toe. States the toe is feeling better but it is still a little bent.  Objective:  Physical Exam: warm, good capillary refill, no trophic changes or ulcerative lesions, normal DP and PT pulses and normal sensory exam.  Right Foot: right 2nd toe more rectus but with rigid PIPJ contracture. No distal callus evident. Assessment:   1. Hammertoe of right foot    Plan:  Patient was evaluated and treated and all questions answered.  Hammertoe right 2nd toe with painful callus -Still with mild pain. Debrided callus lateral 3rd toe -Dispensed toe caps -Discussed should pain persist consider surgical intervention -F/u as needed.     Return if symptoms worsen or fail to improve.

## 2021-03-22 ENCOUNTER — Other Ambulatory Visit: Payer: Self-pay | Admitting: Cardiology

## 2021-03-31 ENCOUNTER — Other Ambulatory Visit: Payer: Self-pay | Admitting: Cardiology

## 2021-04-02 DIAGNOSIS — H353221 Exudative age-related macular degeneration, left eye, with active choroidal neovascularization: Secondary | ICD-10-CM | POA: Diagnosis not present

## 2021-04-15 ENCOUNTER — Encounter: Payer: Self-pay | Admitting: Cardiology

## 2021-04-15 ENCOUNTER — Ambulatory Visit: Payer: Medicare Other | Admitting: Cardiology

## 2021-04-15 ENCOUNTER — Other Ambulatory Visit: Payer: Self-pay

## 2021-04-15 VITALS — BP 134/84 | HR 74 | Ht 71.0 in | Wt 152.0 lb

## 2021-04-15 DIAGNOSIS — I5022 Chronic systolic (congestive) heart failure: Secondary | ICD-10-CM

## 2021-04-15 DIAGNOSIS — I480 Paroxysmal atrial fibrillation: Secondary | ICD-10-CM | POA: Diagnosis not present

## 2021-04-15 DIAGNOSIS — I10 Essential (primary) hypertension: Secondary | ICD-10-CM

## 2021-04-15 MED ORDER — ENTRESTO 49-51 MG PO TABS: 1.0000 | ORAL_TABLET | Freq: Two times a day (BID) | ORAL | 1 refills | Status: DC

## 2021-04-15 NOTE — Addendum Note (Signed)
Addended by: Senaida Ores on: 04/15/2021 02:37 PM   Modules accepted: Orders

## 2021-04-15 NOTE — Progress Notes (Signed)
Cardiology Office Note:    Date:  04/15/2021   ID:  Curtis Ramos, DOB 09-09-1932, MRN [id number]  PCP:  Leonides Sake, MD  Cardiologist:  Jenne Campus, MD    Referring MD: Leonides Sake, MD   Chief Complaint  Patient presents with  . Follow-up  I am doing very well  History of Present Illness:    Curtis Ramos is a 87 y.o. male with past medical history significant for nonischemic cardiomyopathy with significantly reduced ejection fraction 20 to 25% in May 2020, cardiac catheterization done thereafter showed no significant obstructive disease.  As a complication of cardiac catheterization developed pseudoaneurysm of the right radial artery, however that completely resolved by itself.  Also was noted to have atrial fibrillation amazingly all things improved significantly.  His echocardiogram done in December showed normal left ventricle ejection fraction, today he comes for regular follow-up and his EKG shows sinus rhythm.  He is doing very well still works and has no difficulty doing it.  Past Medical History:  Diagnosis Date  . Acute on chronic combined systolic and diastolic CHF (congestive heart failure) ([place]) 03/19/2019  . AKI (acute kidney injury) ([place]) 12/26/2014  . Arthralgia 11/04/2014  . Arthritis    "proabably"  . Atypical mycobacterium infection   . Cancer [facility])    family unaware of this hx on 12/26/2014  . Chronic systolic heart failure ([place])   . Decreased oral intake 12/26/2014  . Decreased oral intake 12/26/2014  . Depressed left ventricular ejection fraction 02/05/2020  . Dilated cardiomyopathy ([place]) 03/19/2019   Ejection fraction 20 to 25% based on echocardiogram done by pulmonologist month ago.  Marland Kitchen DJD (degenerative joint disease) 10/07/2014  . Dyspnea on exertion 03/19/2019  . Essential hypertension 02/05/2020  . GERD (gastroesophageal reflux disease)   . Hammer toes of both feet 01/30/2018  . History of thyroid cancer 12/16/2016  . Hypercalcemia 12/26/2014  .  Hypothyroidism   . Hypothyroidism associated with surgical procedure 10/07/2014  . Lung mass   . Myalgia 11/04/2014  . Mycobacterium avium complex ([place])   . Nail dystrophy 01/30/2018  . Occult malignancy ([place])   . PAF (paroxysmal atrial fibrillation) ([place]) 02/05/2020  . Persistent atrial fibrillation ([place]) 03/19/2019   Chads 2 Vascor equals 3  . PONV (postoperative nausea and vomiting)   . Pre-ulcerative calluses 01/30/2018  . Pseudoaneurysm following procedure ([place]) 04/16/2020  . Sensorineural hearing loss (SNHL), bilateral 08/11/2018  . Shingles   . Toenail fungus 01/30/2018  . Wears glasses     Past Surgical History:  Procedure Laterality Date  . APPENDECTOMY    . CATARACT EXTRACTION W/ INTRAOCULAR LENS  IMPLANT, BILATERAL Bilateral   . COLONOSCOPY    . INGUINAL HERNIA REPAIR Left   . LEFT HEART CATH AND CORONARY ANGIOGRAPHY N/A 03/11/2020   Procedure: LEFT HEART CATH AND CORONARY ANGIOGRAPHY;  Surgeon: Jettie Booze, MD;  Location: [place] [facility];  Service: Cardiovascular;  Laterality: N/A;  . MASS EXCISION Right 08/27/2014   Procedure: EXCISION MASS RIGHT PALM/INDEX;  Surgeon: Leanora Cover, MD;  Location: [place];  Service: Orthopedics;  Laterality: Right;  . THYROIDECTOMY  2003    Current Medications: No outpatient medications have been marked as taking for the 04/15/21 encounter (Office Visit) with Park Liter, MD.     Allergies:   Patient has no known allergies.   Social History   Socioeconomic History  . Marital status: Married    Spouse name: Not on file  .  Number of children: Not on file  . Years of education: Not on file  . Highest education level: Not on file  Occupational History  . Not on file  Tobacco Use  . Smoking status: Never Smoker  . Smokeless tobacco: Never Used  Substance and Sexual Activity  . Alcohol use: No  . Drug use: No  . Sexual activity: Not on file  Other Topics Concern  . Not on file  Social History  Narrative  . Not on file   Social Determinants of Health   Financial Resource Strain: Not on file  Food Insecurity: Not on file  Transportation Needs: Not on file  Physical Activity: Not on file  Stress: Not on file  Social Connections: Not on file     Family History: The patient's family history is not on file. ROS:   Please see the history of present illness.    All 14 point review of systems negative except as described per history of present illness  EKGs/Labs/Other Studies Reviewed:      Recent Labs: 04/16/2020: ALT 228; Hemoglobin 10.1; Platelets 156 10/15/2020: BUN 22; Creatinine, Ser 1.03; Potassium 4.5; Sodium 140  Recent Lipid Panel No results found for: CHOL, TRIG, HDL, CHOLHDL, VLDL, LDLCALC, LDLDIRECT  Physical Exam:    VS:  BP 134/84 (BP Location: Left Arm, Patient Position: Sitting)   Pulse 74   Ht _0  (1.803 m)   Wt 152 lb (68.9 kg)   SpO2 94%   BMI 21.20 kg/m     Wt Readings from Last 3 Encounters:  04/15/21 152 lb (68.9 kg)  10/15/20 145 lb (65.8 kg)  06/05/20 154 lb (69.9 kg)     GEN:  Well nourished, well developed in no acute distress HEENT: Normal NECK: No JVD; No carotid bruits LYMPHATICS: No lymphadenopathy CARDIAC: RRR, no murmurs, no rubs, no gallops RESPIRATORY:  Clear to auscultation without rales, wheezing or rhonchi  ABDOMEN: Soft, non-tender, non-distended MUSCULOSKELETAL:  No edema; No deformity  SKIN: Warm and dry LOWER EXTREMITIES: no swelling NEUROLOGIC:  Alert and oriented x 3 PSYCHIATRIC:  Normal affect   ASSESSMENT:    1. PAF (paroxysmal atrial fibrillation) ([place])   2. Chronic systolic heart failure ([place])   3. Essential hypertension    PLAN:    In order of problems listed above:  1. Paroxysmal atrial fibrillation, he is anticoagulated which I will continue. 2. Chronic systolic congestive heart failure ventricle ejection fraction normalized.  We will refill medications Entresto. 3. Essential hypertension blood  pressure slightly elevated today but again we will put him back on Entresto that should make his blood pressure better. 4. Dyslipidemia I did review his K PN which show me his LDL of 79 HDL 47 we will continue present management.  I have to admit Cejay made amazing recovery he is doing very well looks good still working hard and doing well.   Medication Adjustments/Labs and Tests Ordered: Current medicines are reviewed at length with the patient today.  Concerns regarding medicines are outlined above.  No orders of the defined types were placed in this encounter.  Medication changes: No orders of the defined types were placed in this encounter.   Signed, Park Liter, MD, [facility] 04/15/2021 2:31 PM    [facility]

## 2021-04-15 NOTE — Patient Instructions (Signed)

## 2021-04-16 DIAGNOSIS — H353221 Exudative age-related macular degeneration, left eye, with active choroidal neovascularization: Secondary | ICD-10-CM | POA: Diagnosis not present

## 2021-04-23 DIAGNOSIS — R5383 Other fatigue: Secondary | ICD-10-CM | POA: Diagnosis not present

## 2021-04-23 DIAGNOSIS — E890 Postprocedural hypothyroidism: Secondary | ICD-10-CM | POA: Diagnosis not present

## 2021-04-23 DIAGNOSIS — D485 Neoplasm of uncertain behavior of skin: Secondary | ICD-10-CM | POA: Diagnosis not present

## 2021-04-23 DIAGNOSIS — Z6822 Body mass index (BMI) 22.0-22.9, adult: Secondary | ICD-10-CM | POA: Diagnosis not present

## 2021-04-28 DIAGNOSIS — L578 Other skin changes due to chronic exposure to nonionizing radiation: Secondary | ICD-10-CM | POA: Diagnosis not present

## 2021-04-28 DIAGNOSIS — L570 Actinic keratosis: Secondary | ICD-10-CM | POA: Diagnosis not present

## 2021-04-28 DIAGNOSIS — L821 Other seborrheic keratosis: Secondary | ICD-10-CM | POA: Diagnosis not present

## 2021-04-28 DIAGNOSIS — C44329 Squamous cell carcinoma of skin of other parts of face: Secondary | ICD-10-CM | POA: Diagnosis not present

## 2021-05-05 ENCOUNTER — Other Ambulatory Visit: Payer: Self-pay | Admitting: Cardiology

## 2021-05-05 NOTE — Telephone Encounter (Signed)
70m, 68.9kg, scr 1.46 10/15/20, lovw/krasowski 04/15/21

## 2021-05-07 DIAGNOSIS — Z9181 History of falling: Secondary | ICD-10-CM | POA: Diagnosis not present

## 2021-05-07 DIAGNOSIS — E785 Hyperlipidemia, unspecified: Secondary | ICD-10-CM | POA: Diagnosis not present

## 2021-05-07 DIAGNOSIS — Z0000 Encounter for general adult medical examination without abnormal findings: Secondary | ICD-10-CM | POA: Diagnosis not present

## 2021-05-07 DIAGNOSIS — Z139 Encounter for screening, unspecified: Secondary | ICD-10-CM | POA: Diagnosis not present

## 2021-07-21 DIAGNOSIS — Z23 Encounter for immunization: Secondary | ICD-10-CM | POA: Diagnosis not present

## 2021-07-21 DIAGNOSIS — E890 Postprocedural hypothyroidism: Secondary | ICD-10-CM | POA: Diagnosis not present

## 2021-07-21 DIAGNOSIS — Z6821 Body mass index (BMI) 21.0-21.9, adult: Secondary | ICD-10-CM | POA: Diagnosis not present

## 2021-08-20 DIAGNOSIS — H353112 Nonexudative age-related macular degeneration, right eye, intermediate dry stage: Secondary | ICD-10-CM | POA: Diagnosis not present

## 2021-08-20 DIAGNOSIS — H35373 Puckering of macula, bilateral: Secondary | ICD-10-CM | POA: Diagnosis not present

## 2021-08-20 DIAGNOSIS — H353221 Exudative age-related macular degeneration, left eye, with active choroidal neovascularization: Secondary | ICD-10-CM | POA: Diagnosis not present

## 2021-09-24 DIAGNOSIS — R6889 Other general symptoms and signs: Secondary | ICD-10-CM | POA: Diagnosis not present

## 2021-09-24 DIAGNOSIS — Z6821 Body mass index (BMI) 21.0-21.9, adult: Secondary | ICD-10-CM | POA: Diagnosis not present

## 2021-09-24 DIAGNOSIS — E890 Postprocedural hypothyroidism: Secondary | ICD-10-CM | POA: Diagnosis not present

## 2021-10-12 ENCOUNTER — Other Ambulatory Visit: Payer: Self-pay | Admitting: Cardiology

## 2021-11-18 ENCOUNTER — Other Ambulatory Visit: Payer: Self-pay | Admitting: Cardiology

## 2021-11-20 ENCOUNTER — Other Ambulatory Visit: Payer: Self-pay

## 2021-11-20 ENCOUNTER — Encounter: Payer: Self-pay | Admitting: Cardiology

## 2021-11-20 ENCOUNTER — Ambulatory Visit: Payer: Medicare Other | Admitting: Cardiology

## 2021-11-20 VITALS — BP 108/60 | HR 54 | Ht 71.0 in | Wt 142.6 lb

## 2021-11-20 DIAGNOSIS — I5022 Chronic systolic (congestive) heart failure: Secondary | ICD-10-CM | POA: Diagnosis not present

## 2021-11-20 DIAGNOSIS — I4821 Permanent atrial fibrillation: Secondary | ICD-10-CM | POA: Diagnosis not present

## 2021-11-20 DIAGNOSIS — I10 Essential (primary) hypertension: Secondary | ICD-10-CM | POA: Diagnosis not present

## 2021-11-20 DIAGNOSIS — R0609 Other forms of dyspnea: Secondary | ICD-10-CM | POA: Diagnosis not present

## 2021-11-20 DIAGNOSIS — I420 Dilated cardiomyopathy: Secondary | ICD-10-CM | POA: Diagnosis not present

## 2021-11-20 NOTE — Progress Notes (Signed)
Cardiology Office Note:    Date:  11/20/2021   ID:  Curtis Ramos, DOB Feb 02, 1932, MRN [id number]  PCP:  Leonides Sake, MD  Cardiologist:  Jenne Campus, MD    Referring MD: Leonides Sake, MD   Chief Complaint  Patient presents with   Follow-up  I am doing very well  History of Present Illness:    Curtis Ramos is a 88 y.o. male with past medical history significant for nonischemic cardiomyopathy which initially severely reduced left ventricle ejection fraction 20 to 25% initially diagnosed in May 2020, cardiac catheterization thereafter showed no significant obstructive disease.  Then echocardiogram has been repeated after he was pulled to the guidelines directed medical therapy ejection fraction normal last assessment was in 2021.  He is doing well denies have any chest pain tightness squeezing pressure burning chest.  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  Past Medical History:  Diagnosis Date   Acute on chronic combined systolic and diastolic CHF (congestive heart failure) ([place]) 03/19/2019   AKI (acute kidney injury) ([place]) 12/26/2014   Arthralgia 11/04/2014   Arthritis    "proabably"   Atypical mycobacterium infection    Cancer [facility])    family unaware of this hx on 8/92/1194   Chronic systolic heart failure (HCC)    Decreased oral intake 12/26/2014   Decreased oral intake 12/26/2014   Depressed left ventricular ejection fraction 02/05/2020   Dilated cardiomyopathy ([place]) 03/19/2019   Ejection fraction 20 to 25% based on echocardiogram done by pulmonologist month ago.   DJD (degenerative joint disease) 10/07/2014   Dyspnea on exertion 03/19/2019   Essential hypertension 02/05/2020   GERD (gastroesophageal reflux disease)    Hammer toes of both feet 01/30/2018   History of thyroid cancer 12/16/2016   Hypercalcemia  12/26/2014   Hypothyroidism    Hypothyroidism associated with surgical procedure 10/07/2014   Lung mass    Myalgia 11/04/2014   Mycobacterium avium complex ([place])    Nail dystrophy 01/30/2018   Occult malignancy (HCC)    PAF (paroxysmal atrial fibrillation) ([place]) 02/05/2020   Persistent atrial fibrillation ([place]) 03/19/2019   Chads 2 Vascor equals 3   PONV (postoperative nausea and vomiting)    Pre-ulcerative calluses 01/30/2018   Pseudoaneurysm following procedure ([place]) 04/16/2020   Sensorineural hearing loss (SNHL), bilateral 08/11/2018   Shingles    Toenail fungus 01/30/2018   Wears glasses     Past Surgical History:  Procedure Laterality Date   APPENDECTOMY     CATARACT EXTRACTION W/ INTRAOCULAR LENS  IMPLANT, BILATERAL Bilateral    COLONOSCOPY     INGUINAL HERNIA REPAIR Left    LEFT HEART CATH AND CORONARY ANGIOGRAPHY N/A 03/11/2020   Procedure: LEFT HEART CATH AND CORONARY ANGIOGRAPHY;  Surgeon: Jettie Booze, MD;  Location: [facility];  Service: Cardiovascular;  Laterality: N/A;   MASS EXCISION Right 08/27/2014   Procedure: EXCISION MASS RIGHT PALM/INDEX;  Surgeon: Leanora Cover, MD;  Location: [facility];  Service: Orthopedics;  Laterality: Right;   THYROIDECTOMY  2003    Current Medications: Current Meds  Medication Sig   carvedilol (COREG) 6.25 MG tablet TAKE 1 TABLET BY MOUTH TWICE A DAY (Patient taking differently: Take 6.25 mg by mouth 2 (two) times daily with a meal.)   ELIQUIS 5 MG TABS  tablet TAKE 1 TABLET BY MOUTH TWICE A DAY (Patient taking differently: Take 5 mg by mouth 2 (two) times daily. TAKE 1 TABLET BY MOUTH TWICE A DAY)   ENTRESTO 49-51 MG TAKE 1 TABLET BY MOUTH TWICE A DAY (Patient taking differently: Take 1 tablet by mouth 2 (two) times daily.)   furosemide (LASIX) 40 MG tablet TAKE 1 TABLET BY MOUTH EVERY DAY (Patient taking differently: Take 40 mg by mouth daily.)   levothyroxine (SYNTHROID) 150 MCG tablet Take 150 mcg by mouth  daily.   montelukast (SINGULAIR) 10 MG tablet Take 10 mg by mouth at bedtime.   Multiple Vitamins-Minerals (PRESERVISION AREDS 2 PO) Take 1 capsule by mouth 2 (two) times daily.     Allergies:   Patient has no known allergies.   Social History   Socioeconomic History   Marital status: Married    Spouse name: Not on file   Number of children: Not on file   Years of education: Not on file   Highest education level: Not on file  Occupational History   Not on file  Tobacco Use   Smoking status: Never   Smokeless tobacco: Never  Substance and Sexual Activity   Alcohol use: No   Drug use: No   Sexual activity: Not on file  Other Topics Concern   Not on file  Social History Narrative   Not on file   Social Determinants of Health   Financial Resource Strain: Not on file  Food Insecurity: Not on file  Transportation Needs: Not on file  Physical Activity: Not on file  Stress: Not on file  Social Connections: Not on file     Family History: The patient's family history is not on file. ROS:   Please see the history of present illness.    All 14 point review of systems negative except as described per history of present illness  EKGs/Labs/Other Studies Reviewed:      Recent Labs: No results found for requested labs within last 8760 hours.  Recent Lipid Panel No results found for: CHOL, TRIG, HDL, CHOLHDL, VLDL, LDLCALC, LDLDIRECT  Physical Exam:    VS:  BP 108/60 (BP Location: Left Arm, Patient Position: Sitting)    Pulse (!) 54    Ht _0  (1.803 m)    Wt 142 lb 9.6 oz (64.7 kg)    SpO2 95%    BMI 19.89 kg/m     Wt Readings from Last 3 Encounters:  11/20/21 142 lb 9.6 oz (64.7 kg)  04/15/21 152 lb (68.9 kg)  10/15/20 145 lb (65.8 kg)     GEN:  Well nourished, well developed in no acute distress HEENT: Normal NECK: No JVD; No carotid bruits LYMPHATICS: No lymphadenopathy CARDIAC: RRR, no murmurs, no rubs, no gallops RESPIRATORY:  Clear to auscultation without  rales, wheezing or rhonchi  ABDOMEN: Soft, non-tender, non-distended MUSCULOSKELETAL:  No edema; No deformity  SKIN: Warm and dry LOWER EXTREMITIES: no swelling NEUROLOGIC:  Alert and oriented x 3 PSYCHIATRIC:  Normal affect   ASSESSMENT:    1. Dilated cardiomyopathy ([place])   2. Essential hypertension   3. Chronic systolic heart failure ([place])   4. Permanent atrial fibrillation ([place])   5. Dyspnea on exertion    PLAN:    In order of problems listed above:  Dilated cardiomyopathy normalization appropriate medication which I will continue.  He is on Entresto as well as Coreg I will continue I will schedule him to have another echocardiogram done to recheck  left ventricle ejection fraction. Atrial fibrillation look like it is permanent.  Rate controlled however, he is anticoagulated with Eliquis 5 mg daily which I will continue.  Dose is appropriate to his kidney function age and weight Dyspnea on exertion: We will do echocardiogram to reassess left ventricle ejection fraction.   Medication Adjustments/Labs and Tests Ordered: Current medicines are reviewed at length with the patient today.  Concerns regarding medicines are outlined above.  No orders of the defined types were placed in this encounter.  Medication changes: No orders of the defined types were placed in this encounter.   Signed, Park Liter, MD, [facility] 11/20/2021 3:04 PM    [place]

## 2021-11-20 NOTE — Patient Instructions (Signed)
Medication Instructions:  Your physician recommends that you continue on your current medications as directed. Please refer to the Current Medication list given to you today.  *If you need a refill on your cardiac medications before your next appointment, please call your pharmacy*   Lab Work: Your physician recommends that you return for lab work in: Labs today: BMP, Direct LDL If you have labs (blood work) drawn today and your tests are completely normal, you will receive your results only by: [place] (if you have MyChart) OR A paper copy in the mail If you have any lab test that is abnormal or we need to change your treatment, we will call you to review the results.   Testing/Procedures: Your physician has requested that you have an echocardiogram. Echocardiography is a painless test that uses sound waves to create images of your heart. It provides your doctor with information about the size and shape of your heart and how well your hearts chambers and valves are working. This procedure takes approximately one hour. There are no restrictions for this procedure.    Follow-Up: At [facility], you and your health needs are our priority.  As part of our continuing mission to provide you with exceptional heart care, we have created designated Provider Care Teams.  These Care Teams include your primary Cardiologist (physician) and Advanced Practice Providers (APPs -  Physician Assistants and Nurse Practitioners) who all work together to provide you with the care you need, when you need it.  We recommend signing up for the patient portal called "MyChart".  Sign up information is provided on this After Visit Summary.  MyChart is used to connect with patients for Virtual Visits (Telemedicine).  Patients are able to view lab/test results, encounter notes, upcoming appointments, etc.  Non-urgent messages can be sent to your provider as well.   To learn more about what you can do with  MyChart, go to [email or web address].    Your next appointment:   6 month(s)  The format for your next appointment:   In Person  Provider:   Jenne Campus, MD    Other Instructions Echocardiogram An echocardiogram is a test that uses sound waves (ultrasound) to produce images of the heart. Images from an echocardiogram can provide important information about: Heart size and shape. The size and thickness and movement of your heart's walls. Heart muscle function and strength. Heart valve function or if you have stenosis. Stenosis is when the heart valves are too narrow. If blood is flowing backward through the heart valves (regurgitation). A tumor or infectious growth around the heart valves. Areas of heart muscle that are not working well because of poor blood flow or injury from a heart attack. Aneurysm detection. An aneurysm is a weak or damaged part of an artery wall. The wall bulges out from the normal force of blood pumping through the body. Tell a health care provider about: Any allergies you have. All medicines you are taking, including vitamins, herbs, eye drops, creams, and over-the-counter medicines. Any blood disorders you have. Any surgeries you have had. Any medical conditions you have. Whether you are pregnant or may be pregnant. What are the risks? Generally, this is a safe test. However, problems may occur, including an allergic reaction to dye (contrast) that may be used during the test. What happens before the test? No specific preparation is needed. You may eat and drink normally. What happens during the test?  You will take off your clothes from the waist  up and put on a hospital gown. Electrodes or electrocardiogram (ECG)patches may be placed on your chest. The electrodes or patches are then connected to a device that monitors your heart rate and rhythm. You will lie down on a table for an ultrasound exam. A gel will be applied to your chest to help  sound waves pass through your skin. A handheld device, called a transducer, will be pressed against your chest and moved over your heart. The transducer produces sound waves that travel to your heart and bounce back (or "echo" back) to the transducer. These sound waves will be captured in real-time and changed into images of your heart that can be viewed on a video monitor. The images will be recorded on a computer and reviewed by your health care provider. You may be asked to change positions or hold your breath for a short time. This makes it easier to get different views or better views of your heart. In some cases, you may receive contrast through an IV in one of your veins. This can improve the quality of the pictures from your heart. The procedure may vary among health care providers and hospitals. What can I expect after the test? You may return to your normal, everyday life, including diet, activities, and medicines, unless your health care provider tells you not to do that. Follow these instructions at home: It is up to you to get the results of your test. Ask your health care provider, or the department that is doing the test, when your results will be ready. Keep all follow-up visits. This is important. Summary An echocardiogram is a test that uses sound waves (ultrasound) to produce images of the heart. Images from an echocardiogram can provide important information about the size and shape of your heart, heart muscle function, heart valve function, and other possible heart problems. You do not need to do anything to prepare before this test. You may eat and drink normally. After the echocardiogram is completed, you may return to your normal, everyday life, unless your health care provider tells you not to do that. This information is not intended to replace advice given to you by your health care provider. Make sure you discuss any questions you have with your health care  provider. Document Revised: 07/15/2021 Document Reviewed: 06/24/2020 Elsevier Patient Education  2022 [employer].

## 2021-11-21 LAB — BASIC METABOLIC PANEL
BUN/Creatinine Ratio: 18 (ref 10–24)
BUN: 22 mg/dL (ref 8–27)
CO2: 23 mmol/L (ref 20–29)
Calcium: 10.2 mg/dL (ref 8.6–10.2)
Chloride: 103 mmol/L (ref 96–106)
Creatinine, Ser: 1.21 mg/dL (ref 0.76–1.27)
Glucose: 85 mg/dL (ref 70–99)
Potassium: 4.6 mmol/L (ref 3.5–5.2)
Sodium: 140 mmol/L (ref 134–144)
eGFR: 58 mL/min/{1.73_m2} — ABNORMAL LOW (ref >59)

## 2021-11-21 LAB — LDL CHOLESTEROL, DIRECT: LDL Direct: 80 mg/dL (ref 0–99)

## 2021-12-01 ENCOUNTER — Ambulatory Visit (INDEPENDENT_AMBULATORY_CARE_PROVIDER_SITE_OTHER): Payer: Medicare Other

## 2021-12-01 ENCOUNTER — Other Ambulatory Visit: Payer: Self-pay

## 2021-12-01 DIAGNOSIS — I5022 Chronic systolic (congestive) heart failure: Secondary | ICD-10-CM | POA: Diagnosis not present

## 2021-12-01 DIAGNOSIS — I420 Dilated cardiomyopathy: Secondary | ICD-10-CM

## 2021-12-01 DIAGNOSIS — I4821 Permanent atrial fibrillation: Secondary | ICD-10-CM | POA: Diagnosis not present

## 2021-12-01 DIAGNOSIS — R0609 Other forms of dyspnea: Secondary | ICD-10-CM | POA: Diagnosis not present

## 2021-12-01 DIAGNOSIS — I10 Essential (primary) hypertension: Secondary | ICD-10-CM | POA: Diagnosis not present

## 2021-12-01 LAB — ECHOCARDIOGRAM COMPLETE
MV M vel: 4.68 m/s
MV Peak grad: 87.6 mmHg
P 1/2 time: 722 msec
Radius: 0.2 cm
S' Lateral: 4 cm

## 2021-12-04 ENCOUNTER — Telehealth: Payer: Self-pay

## 2021-12-04 NOTE — Telephone Encounter (Signed)
Patient notified of results.

## 2021-12-04 NOTE — Telephone Encounter (Signed)
-----   Message from Park Liter, MD sent at 12/03/2021  1:18 PM EST ----- Echocardiogram showed normal left ventricle ejection fraction moderate mitral valve regurgitation moderate biatrial enlargement.

## 2021-12-29 ENCOUNTER — Other Ambulatory Visit: Payer: Self-pay | Admitting: Cardiology

## 2021-12-30 NOTE — Telephone Encounter (Signed)
Carvedilol 6.25 mg # 180 x 1 refills sent to [id number] - [place], [place]

## 2021-12-31 DIAGNOSIS — H353221 Exudative age-related macular degeneration, left eye, with active choroidal neovascularization: Secondary | ICD-10-CM | POA: Diagnosis not present

## 2022-01-19 DIAGNOSIS — Z6821 Body mass index (BMI) 21.0-21.9, adult: Secondary | ICD-10-CM | POA: Diagnosis not present

## 2022-01-19 DIAGNOSIS — E890 Postprocedural hypothyroidism: Secondary | ICD-10-CM | POA: Diagnosis not present

## 2022-01-19 DIAGNOSIS — I420 Dilated cardiomyopathy: Secondary | ICD-10-CM | POA: Diagnosis not present

## 2022-01-19 DIAGNOSIS — D6869 Other thrombophilia: Secondary | ICD-10-CM | POA: Diagnosis not present

## 2022-01-19 DIAGNOSIS — I4819 Other persistent atrial fibrillation: Secondary | ICD-10-CM | POA: Diagnosis not present

## 2022-01-19 DIAGNOSIS — N1831 Chronic kidney disease, stage 3a: Secondary | ICD-10-CM | POA: Diagnosis not present

## 2022-01-25 ENCOUNTER — Other Ambulatory Visit: Payer: Self-pay | Admitting: Cardiology

## 2022-03-31 DIAGNOSIS — R222 Localized swelling, mass and lump, trunk: Secondary | ICD-10-CM | POA: Diagnosis not present

## 2022-04-04 DIAGNOSIS — R222 Localized swelling, mass and lump, trunk: Secondary | ICD-10-CM | POA: Diagnosis not present

## 2022-04-29 DIAGNOSIS — H353221 Exudative age-related macular degeneration, left eye, with active choroidal neovascularization: Secondary | ICD-10-CM | POA: Diagnosis not present

## 2022-05-10 DIAGNOSIS — Z0000 Encounter for general adult medical examination without abnormal findings: Secondary | ICD-10-CM | POA: Diagnosis not present

## 2022-05-10 DIAGNOSIS — L0231 Cutaneous abscess of buttock: Secondary | ICD-10-CM | POA: Diagnosis not present

## 2022-05-10 DIAGNOSIS — R222 Localized swelling, mass and lump, trunk: Secondary | ICD-10-CM | POA: Diagnosis not present

## 2022-05-10 DIAGNOSIS — Z9181 History of falling: Secondary | ICD-10-CM | POA: Diagnosis not present

## 2022-05-23 ENCOUNTER — Other Ambulatory Visit: Payer: Self-pay | Admitting: Cardiology

## 2022-05-24 NOTE — Telephone Encounter (Signed)
Prescription refill request for Eliquis received. Indication: Atrial Fib Last office visit: 11/20/21 Nelta Numbers MD Scr: 1.21 on 11/20/21 Age: 88 Weight: 64.7kg  Based on above findings Eliquis '5mg'$  twice daily is the appropriate dose.  Refill approved.

## 2022-06-01 ENCOUNTER — Other Ambulatory Visit: Payer: Self-pay | Admitting: Cardiology

## 2022-06-07 ENCOUNTER — Ambulatory Visit: Payer: Medicare Other | Admitting: Cardiology

## 2022-06-07 ENCOUNTER — Encounter: Payer: Self-pay | Admitting: Cardiology

## 2022-06-07 VITALS — BP 118/70 | HR 108 | Ht 71.0 in | Wt 150.2 lb

## 2022-06-07 DIAGNOSIS — I420 Dilated cardiomyopathy: Secondary | ICD-10-CM

## 2022-06-07 DIAGNOSIS — I480 Paroxysmal atrial fibrillation: Secondary | ICD-10-CM

## 2022-06-07 DIAGNOSIS — I10 Essential (primary) hypertension: Secondary | ICD-10-CM

## 2022-06-07 MED ORDER — AMIODARONE HCL 200 MG PO TABS: 200.0000 mg | ORAL_TABLET | Freq: Two times a day (BID) | ORAL | 3 refills | Status: DC

## 2022-06-07 NOTE — Progress Notes (Unsigned)
Cardiology Office Note:    Date:  06/07/2022   ID:  Curtis Ramos, DOB 1932/03/10, MRN [id number]  PCP:  Leonides Sake, MD  Cardiologist:  Jenne Campus, MD    Referring MD: Leonides Sake, MD   Chief Complaint  Patient presents with   Shortness of Breath   Medication Refill    Entresto    History of Present Illness:    Curtis Ramos is a 88 y.o. male with past medical history significant for nonischemic cardiomyopathy initially diagnosed in 2020 ejection fraction 20 to 25%, cardiac catheterization done thereafter showed nonobstructive disease.  Then he was put on guideline directed medical therapy and normalized his ejection fraction.  Last assessment was done at the beginning of this year when ejection fraction was normal, he did have however biatrial enlargement.  He comes today 2 months for follow-up he said for last couple weeks he is not feeling well.  He is more short of breath he feels distention in abdomen which is exactly the same sensation he felt in 2020 and he was in trouble.  And sure enough h he is due today show atrial fibrillation again retrospectively controlled.  He got left bundle branch block which is new discovery.  He denies have any chest pain tightness squeezing pressure burning chest.  Past Medical History:  Diagnosis Date   Acute on chronic combined systolic and diastolic CHF (congestive heart failure) ([place]) 03/19/2019   AKI (acute kidney injury) ([place]) 12/26/2014   Arthralgia 11/04/2014   Arthritis    "proabably"   Atypical mycobacterium infection    Cancer [facility])    family unaware of this hx on 07/10/4157   Chronic systolic heart failure (HCC)    Decreased oral intake 12/26/2014   Decreased oral intake 12/26/2014   Depressed left ventricular ejection fraction 02/05/2020   Dilated cardiomyopathy ([place]) 03/19/2019   Ejection fraction 20 to 25% based on echocardiogram done by pulmonologist month ago.   DJD (degenerative joint disease) 10/07/2014   Dyspnea  on exertion 03/19/2019   Essential hypertension 02/05/2020   GERD (gastroesophageal reflux disease)    Hammer toes of both feet 01/30/2018   History of thyroid cancer 12/16/2016   Hypercalcemia 12/26/2014   Hypothyroidism    Hypothyroidism associated with surgical procedure 10/07/2014   Lung mass    Myalgia 11/04/2014   Mycobacterium avium complex ([place])    Nail dystrophy 01/30/2018   Occult malignancy (HCC)    PAF (paroxysmal atrial fibrillation) ([place]) 02/05/2020   Persistent atrial fibrillation ([place]) 03/19/2019   Chads 2 Vascor equals 3   PONV (postoperative nausea and vomiting)    Pre-ulcerative calluses 01/30/2018   Pseudoaneurysm following procedure ([place]) 04/16/2020   Sensorineural hearing loss (SNHL), bilateral 08/11/2018   Shingles    Toenail fungus 01/30/2018   Wears glasses     Past Surgical History:  Procedure Laterality Date   APPENDECTOMY     CATARACT EXTRACTION W/ INTRAOCULAR LENS  IMPLANT, BILATERAL Bilateral    COLONOSCOPY     INGUINAL HERNIA REPAIR Left    LEFT HEART CATH AND CORONARY ANGIOGRAPHY N/A 03/11/2020   Procedure: LEFT HEART CATH AND CORONARY ANGIOGRAPHY;  Surgeon: Jettie Booze, MD;  Location: [facility];  Service: Cardiovascular;  Laterality: N/A;   MASS EXCISION Right 08/27/2014   Procedure: EXCISION MASS RIGHT PALM/INDEX;  Surgeon: Leanora Cover, MD;  Location: [place];  Service: Orthopedics;  Laterality: Right;   THYROIDECTOMY  2003    Current Medications:  Current Meds  Medication Sig   carvedilol (COREG) 6.25 MG tablet TAKE 1 TABLET BY MOUTH TWICE A DAY (Patient taking differently: Take 6.25 mg by mouth 2 (two) times daily with a meal.)   ELIQUIS 5 MG TABS tablet TAKE 1 TABLET BY MOUTH TWICE A DAY (Patient taking differently: Take 5 mg by mouth 2 (two) times daily. TAKE 1 TABLET BY MOUTH TWICE A DAY)   furosemide (LASIX) 40 MG tablet Take 1 tablet (40 mg total) by mouth daily.   levothyroxine (SYNTHROID) 150 MCG tablet Take 150  mcg by mouth daily.   montelukast (SINGULAIR) 10 MG tablet Take 10 mg by mouth at bedtime.   Multiple Vitamins-Minerals (PRESERVISION AREDS 2 PO) Take 1 capsule by mouth 2 (two) times daily.   sacubitril-valsartan (ENTRESTO) 49-51 MG Take 1 tablet by mouth 2 (two) times daily.   SYNTHROID 175 MCG tablet Take 175 mcg by mouth daily.     Allergies:   Patient has no known allergies.   Social History   Socioeconomic History   Marital status: Married    Spouse name: Not on file   Number of children: Not on file   Years of education: Not on file   Highest education level: Not on file  Occupational History   Not on file  Tobacco Use   Smoking status: Never   Smokeless tobacco: Never  Substance and Sexual Activity   Alcohol use: No   Drug use: No   Sexual activity: Not on file  Other Topics Concern   Not on file  Social History Narrative   Not on file   Social Determinants of Health   Financial Resource Strain: Not on file  Food Insecurity: Not on file  Transportation Needs: Not on file  Physical Activity: Not on file  Stress: Not on file  Social Connections: Not on file     Family History: The patient's family history is not on file. ROS:   Please see the history of present illness.    All 14 point review of systems negative except as described per history of present illness  EKGs/Labs/Other Studies Reviewed:      Recent Labs: 11/20/2021: BUN 22; Creatinine, Ser 1.21; Potassium 4.6; Sodium 140  Recent Lipid Panel    Component Value Date/Time   LDLDIRECT 80 11/20/2021 1523    Physical Exam:    VS:  BP 118/70 (BP Location: Left Arm, Patient Position: Sitting)   Pulse (!) 108   Ht _0  (1.803 m)   Wt 150 lb 3.2 oz (68.1 kg)   SpO2 95%   BMI 20.95 kg/m     Wt Readings from Last 3 Encounters:  06/07/22 150 lb 3.2 oz (68.1 kg)  11/20/21 142 lb 9.6 oz (64.7 kg)  04/15/21 152 lb (68.9 kg)     GEN:  Well nourished, well developed in no acute distress HEENT:  Normal NECK: No JVD; No carotid bruits LYMPHATICS: No lymphadenopathy CARDIAC: Irregularly irregular, no murmurs, no rubs, no gallops RESPIRATORY:  Clear to auscultation without rales, wheezing or rhonchi  ABDOMEN: Soft, non-tender, non-distended MUSCULOSKELETAL:  No edema; No deformity  SKIN: Warm and dry LOWER EXTREMITIES: no swelling NEUROLOGIC:  Alert and oriented x 3 PSYCHIATRIC:  Normal affect   ASSESSMENT:    1. Dilated cardiomyopathy ([place])   2. PAF (paroxysmal atrial fibrillation) ([place])   3. Essential hypertension    PLAN:    In order of problems listed above:  Paroxysmal atrial fibrillation he is in atrial fibrillation  today rate is relatively controlled at 101.  He is anticoagulated which I will continue.  I will put him on amiodarone 200 g twice daily bring him back to the office in about 2 weeks to see if he converts to sinus rhythm if not we may make arrangements for cardioversion History of cardiomyopathy on guideline directed medical therapy however he admits that 2 weeks ago he ran out of Entresto interestingly since that time he started having symptoms and episodes of atrial fibrillation happen since that time. Essential hypertension: Blood pressure well controlled. Left bundle branch block which is new discovery, last echocardiogram done in January showed preserved ejection fraction.  If we still have some difficulty few weeks later from today we will do echocardiogram again   Medication Adjustments/Labs and Tests Ordered: Current medicines are reviewed at length with the patient today.  Concerns regarding medicines are outlined above.  No orders of the defined types were placed in this encounter.  Medication changes: No orders of the defined types were placed in this encounter.   Signed, Park Liter, MD, [facility] 06/07/2022 3:09 PM    [place]

## 2022-06-07 NOTE — Patient Instructions (Addendum)
Medication Instructions:  Your physician has recommended you make the following change in your medication: Amiodarone '200mg'$  1 tablet  twice daily by mouth    Lab Work: None ordered   Testing/Procedures: None Ordered   Follow-Up: At [facility], you and your health needs are our priority.  As part of our continuing mission to provide you with exceptional heart care, we have created designated Provider Care Teams.  These Care Teams include your primary Cardiologist (physician) and Advanced Practice Providers (APPs -  Physician Assistants and Nurse Practitioners) who all work together to provide you with the care you need, when you need it.  We recommend signing up for the patient portal called "MyChart".  Sign up information is provided on this After Visit Summary.  MyChart is used to connect with patients for Virtual Visits (Telemedicine).  Patients are able to view lab/test results, encounter notes, upcoming appointments, etc.  Non-urgent messages can be sent to your provider as well.   To learn more about what you can do with MyChart, go to [email or web address].    Your next appointment:   1 month(s)  The format for your next appointment:   In Person  Provider:   Jenne Campus, MD    Other Instructions NURSE VISIT - 2 weeks for EKG only

## 2022-06-09 ENCOUNTER — Telehealth: Payer: Self-pay | Admitting: Cardiology

## 2022-06-09 NOTE — Telephone Encounter (Signed)
Pt states that he took 3 doses of the Amiodarone and he said he felt like he was floating through the air. Pt states he cannot take the medication and would like to discuss the cardioversion.

## 2022-06-09 NOTE — Telephone Encounter (Signed)
Pt c/o medication issue:  1. Name of Medication:   amiodarone (PACERONE) 200 MG tablet  2. How are you currently taking this medication (dosage and times per day)? As prescribed  3. Are you having a reaction (difficulty breathing--STAT)?  No  4. What is your medication issue?   Patient called stating that the medication did not work for him and he wants to look into electric shock/next steps.

## 2022-06-10 NOTE — Telephone Encounter (Signed)
Sent info to front desk for appt to come in to discuss cardioversion.

## 2022-06-12 DIAGNOSIS — J90 Pleural effusion, not elsewhere classified: Secondary | ICD-10-CM | POA: Diagnosis not present

## 2022-06-12 DIAGNOSIS — I429 Cardiomyopathy, unspecified: Secondary | ICD-10-CM | POA: Diagnosis not present

## 2022-06-12 DIAGNOSIS — M6289 Other specified disorders of muscle: Secondary | ICD-10-CM | POA: Diagnosis not present

## 2022-06-12 DIAGNOSIS — R42 Dizziness and giddiness: Secondary | ICD-10-CM | POA: Diagnosis not present

## 2022-06-12 DIAGNOSIS — I5021 Acute systolic (congestive) heart failure: Secondary | ICD-10-CM | POA: Diagnosis not present

## 2022-06-12 DIAGNOSIS — I447 Left bundle-branch block, unspecified: Secondary | ICD-10-CM | POA: Diagnosis not present

## 2022-06-12 DIAGNOSIS — I480 Paroxysmal atrial fibrillation: Secondary | ICD-10-CM | POA: Diagnosis not present

## 2022-06-12 DIAGNOSIS — R0902 Hypoxemia: Secondary | ICD-10-CM | POA: Diagnosis not present

## 2022-06-12 DIAGNOSIS — I4819 Other persistent atrial fibrillation: Secondary | ICD-10-CM | POA: Diagnosis not present

## 2022-06-12 DIAGNOSIS — E039 Hypothyroidism, unspecified: Secondary | ICD-10-CM | POA: Diagnosis not present

## 2022-06-12 DIAGNOSIS — Z7901 Long term (current) use of anticoagulants: Secondary | ICD-10-CM | POA: Diagnosis not present

## 2022-06-12 DIAGNOSIS — I499 Cardiac arrhythmia, unspecified: Secondary | ICD-10-CM | POA: Diagnosis not present

## 2022-06-12 DIAGNOSIS — I700 Atherosclerosis of aorta: Secondary | ICD-10-CM | POA: Diagnosis not present

## 2022-06-12 DIAGNOSIS — R109 Unspecified abdominal pain: Secondary | ICD-10-CM | POA: Diagnosis not present

## 2022-06-12 DIAGNOSIS — Z79899 Other long term (current) drug therapy: Secondary | ICD-10-CM | POA: Diagnosis not present

## 2022-06-12 DIAGNOSIS — I420 Dilated cardiomyopathy: Secondary | ICD-10-CM | POA: Diagnosis not present

## 2022-06-12 DIAGNOSIS — R0602 Shortness of breath: Secondary | ICD-10-CM | POA: Diagnosis not present

## 2022-06-12 DIAGNOSIS — I340 Nonrheumatic mitral (valve) insufficiency: Secondary | ICD-10-CM | POA: Diagnosis not present

## 2022-06-12 DIAGNOSIS — J439 Emphysema, unspecified: Secondary | ICD-10-CM | POA: Diagnosis not present

## 2022-06-12 DIAGNOSIS — I361 Nonrheumatic tricuspid (valve) insufficiency: Secondary | ICD-10-CM | POA: Diagnosis not present

## 2022-06-12 DIAGNOSIS — I509 Heart failure, unspecified: Secondary | ICD-10-CM | POA: Diagnosis not present

## 2022-06-12 DIAGNOSIS — J9601 Acute respiratory failure with hypoxia: Secondary | ICD-10-CM | POA: Diagnosis not present

## 2022-06-12 DIAGNOSIS — I5023 Acute on chronic systolic (congestive) heart failure: Secondary | ICD-10-CM | POA: Diagnosis not present

## 2022-06-12 DIAGNOSIS — I110 Hypertensive heart disease with heart failure: Secondary | ICD-10-CM | POA: Diagnosis not present

## 2022-06-13 DIAGNOSIS — I429 Cardiomyopathy, unspecified: Secondary | ICD-10-CM | POA: Diagnosis not present

## 2022-06-13 DIAGNOSIS — I509 Heart failure, unspecified: Secondary | ICD-10-CM | POA: Diagnosis not present

## 2022-06-13 DIAGNOSIS — I4819 Other persistent atrial fibrillation: Secondary | ICD-10-CM | POA: Diagnosis not present

## 2022-06-13 DIAGNOSIS — I447 Left bundle-branch block, unspecified: Secondary | ICD-10-CM | POA: Diagnosis not present

## 2022-06-14 DIAGNOSIS — I509 Heart failure, unspecified: Secondary | ICD-10-CM

## 2022-06-14 DIAGNOSIS — I429 Cardiomyopathy, unspecified: Secondary | ICD-10-CM

## 2022-06-14 DIAGNOSIS — I4819 Other persistent atrial fibrillation: Secondary | ICD-10-CM

## 2022-06-15 DIAGNOSIS — I4819 Other persistent atrial fibrillation: Secondary | ICD-10-CM | POA: Diagnosis not present

## 2022-06-15 DIAGNOSIS — I5021 Acute systolic (congestive) heart failure: Secondary | ICD-10-CM | POA: Diagnosis not present

## 2022-06-15 DIAGNOSIS — I429 Cardiomyopathy, unspecified: Secondary | ICD-10-CM | POA: Diagnosis not present

## 2022-06-18 ENCOUNTER — Ambulatory Visit: Payer: Medicare Other | Admitting: Cardiology

## 2022-06-18 ENCOUNTER — Encounter: Payer: Self-pay | Admitting: Cardiology

## 2022-06-18 VITALS — BP 90/62 | HR 68 | Ht 71.0 in | Wt 143.6 lb

## 2022-06-18 DIAGNOSIS — Z79899 Other long term (current) drug therapy: Secondary | ICD-10-CM | POA: Diagnosis not present

## 2022-06-18 DIAGNOSIS — I5022 Chronic systolic (congestive) heart failure: Secondary | ICD-10-CM | POA: Diagnosis not present

## 2022-06-18 DIAGNOSIS — I480 Paroxysmal atrial fibrillation: Secondary | ICD-10-CM | POA: Diagnosis not present

## 2022-06-18 DIAGNOSIS — Z139 Encounter for screening, unspecified: Secondary | ICD-10-CM | POA: Diagnosis not present

## 2022-06-18 DIAGNOSIS — I4819 Other persistent atrial fibrillation: Secondary | ICD-10-CM | POA: Diagnosis not present

## 2022-06-18 DIAGNOSIS — Z7689 Persons encountering health services in other specified circumstances: Secondary | ICD-10-CM | POA: Diagnosis not present

## 2022-06-18 DIAGNOSIS — I420 Dilated cardiomyopathy: Secondary | ICD-10-CM

## 2022-06-18 DIAGNOSIS — I5020 Unspecified systolic (congestive) heart failure: Secondary | ICD-10-CM | POA: Diagnosis not present

## 2022-06-18 NOTE — Patient Instructions (Addendum)
Medication Instructions:  Your physician has recommended you make the following change in your medication:   DECREASE: LASIX to '20mg'$  every day ( Furosemide)     Lab Work:  BMP, Magnesium, ProBNP- Today  If you have labs (blood work) drawn today and your tests are completely normal, you will receive your results only by: [place] (if you have MyChart) OR A paper copy in the mail If you have any lab test that is abnormal or we need to change your treatment, we will call you to review the results.   Testing/Procedures: None Ordered   Follow-Up: At [facility], you and your health needs are our priority.  As part of our continuing mission to provide you with exceptional heart care, we have created designated Provider Care Teams.  These Care Teams include your primary Cardiologist (physician) and Advanced Practice Providers (APPs -  Physician Assistants and Nurse Practitioners) who all work together to provide you with the care you need, when you need it.  We recommend signing up for the patient portal called "MyChart".  Sign up information is provided on this After Visit Summary.  MyChart is used to connect with patients for Virtual Visits (Telemedicine).  Patients are able to view lab/test results, encounter notes, upcoming appointments, etc.  Non-urgent messages can be sent to your provider as well.   To learn more about what you can do with MyChart, go to [email or web address].    Your next appointment:   1 month(s)  The format for your next appointment:   In Person  Provider:   Jenne Campus, MD    Other Instructions NA

## 2022-06-18 NOTE — Addendum Note (Signed)
Addended by: Jacobo Forest D on: 06/18/2022 01:46 PM   Modules accepted: Orders

## 2022-06-18 NOTE — Progress Notes (Signed)
Cardiology Office Note:    Date:  06/18/2022   ID:  Curtis Ramos, DOB [birth year], MRN [id number]  PCP:  Leonides Sake, MD  Cardiologist:  Jenne Campus, MD    Referring MD: Leonides Sake, MD   Chief Complaint  Patient presents with   Medication Management    History of Present Illness:    Curtis Ramos is a 88 y.o. male with past medical history significant for nonischemic cardiomyopathy initially diagnosed in 2020 with ejection fraction about 25%, he was also in atrial fibrillation.  He is anticoagulated put on guideline directed medical therapy converted spontaneously to sinus rhythm and his left ventricle ejection fraction normalized he also had cardiac catheterization 2021 which showed practically normal coronaries.  Nonobstructive disease.  Recently he showed up in my office because of shortness of breath he was find to be back in atrial fibrillation eventually end up being put in the hospital diuresis.  Echocardiogram performed showed again weakening of his heart with ejection fraction about 25 to 30%.  He was put on guideline directed medical therapy in the hospital diuresed and discharged home.  Doing very well feels back to himself strength is back he is working again still in atrial fibrillation rate controlled he does have frequent PVCs.  Denies have any dizziness or passing out, no chest pain tightness squeezing pressure burning chest  Past Medical History:  Diagnosis Date   Acute on chronic combined systolic and diastolic CHF (congestive heart failure) ([place]) 03/19/2019   AKI (acute kidney injury) ([place]) 12/26/2014   Arthralgia 11/04/2014   Arthritis    "proabably"   Atypical mycobacterium infection    Cancer [facility])    family unaware of this hx on 5/73/2202   Chronic systolic heart failure (HCC)    Decreased oral intake 12/26/2014   Decreased oral intake 12/26/2014   Depressed left ventricular ejection fraction 02/05/2020   Dilated cardiomyopathy ([place]) 03/19/2019    Ejection fraction 20 to 25% based on echocardiogram done by pulmonologist month ago.   DJD (degenerative joint disease) 10/07/2014   Dyspnea on exertion 03/19/2019   Essential hypertension 02/05/2020   GERD (gastroesophageal reflux disease)    Hammer toes of both feet 01/30/2018   History of thyroid cancer 12/16/2016   Hypercalcemia 12/26/2014   Hypothyroidism    Hypothyroidism associated with surgical procedure 10/07/2014   Lung mass    Myalgia 11/04/2014   Mycobacterium avium complex ([place])    Nail dystrophy 01/30/2018   Occult malignancy (HCC)    PAF (paroxysmal atrial fibrillation) ([place]) 02/05/2020   Persistent atrial fibrillation ([place]) 03/19/2019   Chads 2 Vascor equals 3   PONV (postoperative nausea and vomiting)    Pre-ulcerative calluses 01/30/2018   Pseudoaneurysm following procedure ([place]) 04/16/2020   Sensorineural hearing loss (SNHL), bilateral 08/11/2018   Shingles    Toenail fungus 01/30/2018   Wears glasses     Past Surgical History:  Procedure Laterality Date   APPENDECTOMY     CATARACT EXTRACTION W/ INTRAOCULAR LENS  IMPLANT, BILATERAL Bilateral    COLONOSCOPY     INGUINAL HERNIA REPAIR Left    LEFT HEART CATH AND CORONARY ANGIOGRAPHY N/A 03/11/2020   Procedure: LEFT HEART CATH AND CORONARY ANGIOGRAPHY;  Surgeon: Jettie Booze, MD;  Location: [facility];  Service: Cardiovascular;  Laterality: N/A;   MASS EXCISION Right 08/27/2014   Procedure: EXCISION MASS RIGHT PALM/INDEX;  Surgeon: Leanora Cover, MD;  Location: [place];  Service: Orthopedics;  Laterality: Right;  THYROIDECTOMY  2003    Current Medications: Current Meds  Medication Sig   amiodarone (PACERONE) 200 MG tablet Take 1 tablet (200 mg total) by mouth 2 (two) times daily.   carvedilol (COREG) 6.25 MG tablet TAKE 1 TABLET BY MOUTH TWICE A DAY (Patient taking differently: Take 6.25 mg by mouth 2 (two) times daily with a meal.)   ELIQUIS 5 MG TABS tablet TAKE 1 TABLET BY MOUTH TWICE A  DAY (Patient taking differently: Take 5 mg by mouth 2 (two) times daily. TAKE 1 TABLET BY MOUTH TWICE A DAY)   furosemide (LASIX) 40 MG tablet Take 1 tablet (40 mg total) by mouth daily.   montelukast (SINGULAIR) 10 MG tablet Take 10 mg by mouth at bedtime.   Multiple Vitamins-Minerals (PRESERVISION AREDS 2 PO) Take 1 capsule by mouth 2 (two) times daily.   sacubitril-valsartan (ENTRESTO) 49-51 MG Take 1 tablet by mouth 2 (two) times daily.   SYNTHROID 175 MCG tablet Take 175 mcg by mouth daily.   [DISCONTINUED] levothyroxine (SYNTHROID) 150 MCG tablet Take 150 mcg by mouth daily.     Allergies:   Patient has no known allergies.   Social History   Socioeconomic History   Marital status: Married    Spouse name: Not on file   Number of children: Not on file   Years of education: Not on file   Highest education level: Not on file  Occupational History   Not on file  Tobacco Use   Smoking status: Never   Smokeless tobacco: Never  Substance and Sexual Activity   Alcohol use: No   Drug use: No   Sexual activity: Not on file  Other Topics Concern   Not on file  Social History Narrative   Not on file   Social Determinants of Health   Financial Resource Strain: Not on file  Food Insecurity: Not on file  Transportation Needs: Not on file  Physical Activity: Not on file  Stress: Not on file  Social Connections: Not on file     Family History: The patient's family history is not on file. ROS:   Please see the history of present illness.    All 14 point review of systems negative except as described per history of present illness  EKGs/Labs/Other Studies Reviewed:      Recent Labs: 11/20/2021: BUN 22; Creatinine, Ser 1.21; Potassium 4.6; Sodium 140  Recent Lipid Panel    Component Value Date/Time   LDLDIRECT 80 11/20/2021 1523    Physical Exam:    VS:  BP 90/62 (BP Location: Left Arm, Patient Position: Sitting)   Pulse 68   Ht _0  (1.803 m)   Wt 143 lb 9.6 oz  (65.1 kg)   SpO2 93%   BMI 20.03 kg/m     Wt Readings from Last 3 Encounters:  06/18/22 143 lb 9.6 oz (65.1 kg)  06/07/22 150 lb 3.2 oz (68.1 kg)  11/20/21 142 lb 9.6 oz (64.7 kg)     GEN:  Well nourished, well developed in no acute distress HEENT: Normal NECK: No JVD; No carotid bruits LYMPHATICS: No lymphadenopathy CARDIAC: Irregularly irregular, no murmurs, no rubs, no gallops RESPIRATORY:  Clear to auscultation without rales, wheezing or rhonchi  ABDOMEN: Soft, non-tender, non-distended MUSCULOSKELETAL:  No edema; No deformity  SKIN: Warm and dry LOWER EXTREMITIES: no swelling NEUROLOGIC:  Alert and oriented x 3 PSYCHIATRIC:  Normal affect   ASSESSMENT:    1. Chronic systolic heart failure ([place])   2. PAF (  paroxysmal atrial fibrillation) ([place])   3. Dilated cardiomyopathy (HCC)    PLAN:    In order of problems listed above:  Paroxysmal atrial fibrillation now is persistent atrial fibrillation he is anticoagulant with Eliquis which I will continue he is on amiodarone I will continue 200 mg twice daily for another 2 weeks and then reduce to 200 mg once a day.  I will bring him back to my office in about a month to see what his status is if he still in atrial fibrillation we will make arrangements for cardioversion Cardiomyopathy etiology is unclear.  Nonischemic he did have cardiac catheterization 2021 with luminal disease, possible explanation for his cardiomyopathy his atrial fibrillation however rate was not excessive.  Possible explanation is the fact that he ran out of Entresto few weeks before he deteriorated he is back on that medication doing very well I hope to be able to improve his left ventricle ejection fraction to normalization, will repeat quick look echocardiogram probably in about a month before started having conversation with cardioversion Systolic congestive heart failure seems to be compensated on physical exam actually blood pressure low I will cut down his  Lasix to only 20 mg daily.  I will check Chem-7 and proBNP today as well as magnesium level   Medication Adjustments/Labs and Tests Ordered: Current medicines are reviewed at length with the patient today.  Concerns regarding medicines are outlined above.  No orders of the defined types were placed in this encounter.  Medication changes: No orders of the defined types were placed in this encounter.   Signed, Park Liter, MD, Physician [facility] 06/18/2022 1:15 PM    [facility]

## 2022-06-19 LAB — BASIC METABOLIC PANEL
BUN/Creatinine Ratio: 22 (ref 10–24)
BUN: 30 mg/dL — ABNORMAL HIGH (ref 8–27)
CO2: 25 mmol/L (ref 20–29)
Calcium: 10.6 mg/dL — ABNORMAL HIGH (ref 8.6–10.2)
Chloride: 102 mmol/L (ref 96–106)
Creatinine, Ser: 1.35 mg/dL — ABNORMAL HIGH (ref 0.76–1.27)
Glucose: 89 mg/dL (ref 70–99)
Potassium: 4.9 mmol/L (ref 3.5–5.2)
Sodium: 141 mmol/L (ref 134–144)
eGFR: 51 mL/min/{1.73_m2} — ABNORMAL LOW (ref >59)

## 2022-06-19 LAB — MAGNESIUM: Magnesium: 2.5 mg/dL — ABNORMAL HIGH (ref 1.6–2.3)

## 2022-06-19 LAB — PRO B NATRIURETIC PEPTIDE: NT-Pro BNP: 1795 pg/mL — ABNORMAL HIGH (ref 0–486)

## 2022-06-21 ENCOUNTER — Other Ambulatory Visit: Payer: Self-pay | Admitting: Cardiology

## 2022-06-23 ENCOUNTER — Ambulatory Visit: Payer: Medicare Other

## 2022-07-05 ENCOUNTER — Telehealth: Payer: Self-pay

## 2022-07-05 NOTE — Telephone Encounter (Signed)
-----   Message from Park Liter, MD sent at 06/24/2022  4:51 PM EDT ----- Labs are looking good, I would continue present management

## 2022-07-05 NOTE — Telephone Encounter (Signed)
Message sent to Dr. Raliegh Ip Patient notified of results. He states he take his furosemide at night and when he first wakes up. Now,at night he's weighting between 137-139 then in the morning weight about 131-132. He said he does not go to the bathroom no more than 2 time at night. Does he need to be concern? He said his bloop pressure has been normal range.  Please advise.

## 2022-07-06 NOTE — Telephone Encounter (Signed)
Patient notified of the following and denied having concerning sx's. Advised to continue with present management and monitoring his weight per RJK.

## 2022-07-09 ENCOUNTER — Ambulatory Visit: Payer: Medicare Other | Admitting: Cardiology

## 2022-07-13 ENCOUNTER — Telehealth: Payer: Self-pay

## 2022-07-13 MED ORDER — ENTRESTO 49-51 MG PO TABS: 1.0000 | ORAL_TABLET | Freq: Two times a day (BID) | ORAL | 3 refills | Status: DC

## 2022-07-13 NOTE — Telephone Encounter (Signed)
Patient is applying for assistance with [employer]. Provider portion of the application and Rx awaiting Dr. Raliegh Ip signature

## 2022-07-14 ENCOUNTER — Other Ambulatory Visit: Payer: Self-pay

## 2022-07-14 ENCOUNTER — Other Ambulatory Visit: Payer: Self-pay | Admitting: Cardiology

## 2022-07-14 ENCOUNTER — Telehealth: Payer: Self-pay | Admitting: Cardiology

## 2022-07-14 MED ORDER — ENTRESTO 49-51 MG PO TABS: 1.0000 | ORAL_TABLET | Freq: Two times a day (BID) | ORAL | 3 refills | Status: DC

## 2022-07-14 NOTE — Telephone Encounter (Signed)
Patient calling the office for samples of medication:   1.  What medication and dosage are you requesting samples for? Entresto  2.  Are you currently out of this medication? Only 2 left, can not get his refill  until 07-23-22- need these today if you have any please

## 2022-07-15 DIAGNOSIS — N1831 Chronic kidney disease, stage 3a: Secondary | ICD-10-CM | POA: Diagnosis not present

## 2022-07-15 DIAGNOSIS — E890 Postprocedural hypothyroidism: Secondary | ICD-10-CM | POA: Diagnosis not present

## 2022-07-20 ENCOUNTER — Telehealth: Payer: Self-pay | Admitting: Cardiology

## 2022-07-20 DIAGNOSIS — N1831 Chronic kidney disease, stage 3a: Secondary | ICD-10-CM | POA: Diagnosis not present

## 2022-07-20 DIAGNOSIS — S20459A Superficial foreign body of unspecified back wall of thorax, initial encounter: Secondary | ICD-10-CM | POA: Diagnosis not present

## 2022-07-20 DIAGNOSIS — E890 Postprocedural hypothyroidism: Secondary | ICD-10-CM | POA: Diagnosis not present

## 2022-07-20 DIAGNOSIS — N182 Chronic kidney disease, stage 2 (mild): Secondary | ICD-10-CM | POA: Diagnosis not present

## 2022-07-20 DIAGNOSIS — I4819 Other persistent atrial fibrillation: Secondary | ICD-10-CM | POA: Diagnosis not present

## 2022-07-20 DIAGNOSIS — I420 Dilated cardiomyopathy: Secondary | ICD-10-CM | POA: Diagnosis not present

## 2022-07-20 DIAGNOSIS — Z6821 Body mass index (BMI) 21.0-21.9, adult: Secondary | ICD-10-CM | POA: Diagnosis not present

## 2022-07-20 NOTE — Telephone Encounter (Signed)
Spoke with the patient verbally authorize to speak with Helene Kelp, per Helene Kelp, pt portion f the application was mailed to [employer].

## 2022-07-20 NOTE — Telephone Encounter (Signed)
Pt secretary called stating she is returning a call for pt from [place]. Helene Kelp, Network engineer, is not on [employer]

## 2022-07-21 NOTE — Telephone Encounter (Signed)
I called [employer] still has not received mailed information.

## 2022-07-26 ENCOUNTER — Other Ambulatory Visit (HOSPITAL_COMMUNITY): Payer: Self-pay

## 2022-07-26 ENCOUNTER — Other Ambulatory Visit: Payer: Self-pay

## 2022-07-26 ENCOUNTER — Encounter: Payer: Self-pay | Admitting: Cardiology

## 2022-07-26 ENCOUNTER — Ambulatory Visit: Payer: Medicare Other | Attending: Cardiology | Admitting: Cardiology

## 2022-07-26 VITALS — BP 120/72 | HR 94 | Ht 71.0 in | Wt 147.4 lb

## 2022-07-26 DIAGNOSIS — R0609 Other forms of dyspnea: Secondary | ICD-10-CM

## 2022-07-26 DIAGNOSIS — I4819 Other persistent atrial fibrillation: Secondary | ICD-10-CM | POA: Diagnosis not present

## 2022-07-26 DIAGNOSIS — I420 Dilated cardiomyopathy: Secondary | ICD-10-CM

## 2022-07-26 DIAGNOSIS — I10 Essential (primary) hypertension: Secondary | ICD-10-CM

## 2022-07-26 MED ORDER — FUROSEMIDE 20 MG PO TABS: 20.0000 mg | ORAL_TABLET | Freq: Every day | ORAL | 3 refills | Status: DC

## 2022-07-26 MED ORDER — DAPAGLIFLOZIN PROPANEDIOL 10 MG PO TABS: 10.0000 mg | ORAL_TABLET | Freq: Every day | ORAL | 6 refills | Status: DC

## 2022-07-26 NOTE — Addendum Note (Signed)
Addended by: Jacobo Forest D on: 07/26/2022 03:43 PM   Modules accepted: Orders

## 2022-07-26 NOTE — Progress Notes (Signed)
Cardiology Office Note:    Date:  07/26/2022   ID:  [name], DOB 1932/07/04, MRN [id number]  PCP:  Leonides Sake, MD  Cardiologist:  Jenne Campus, MD    Referring MD: Leonides Sake, MD   Chief Complaint  Patient presents with   Follow-up    History of Present Illness:    Curtis Ramos is a 88 y.o. male with past medical history significant for nonischemic cardiomyopathy initially diagnosed in 2020 with ejection fraction about 25%, he was also in atrial fibrillation.  He is anticoagulated put on guideline directed medical therapy converted spontaneously to sinus rhythm and his left ventricle ejection fraction normalized he also had cardiac catheterization 2021 which showed practically normal coronaries.  Nonobstructive disease.  Recently he showed up in my office because of shortness of breath he was find to be back in atrial fibrillation eventually end up being put in the hospital diuresis.  Echocardiogram performed showed again weakening of his heart with ejection fraction about 25 to 30%.  He was put on guideline directed medical therapy in the hospital diuresed and discharged home.   He comes today 2 months for follow-up overall he said he is feeling better.  More strength can work again.  I put him on amiodarone however he was not able to tolerate that medication that medication has been discontinued.  He is doing well and denies having swelling shortness of breath chest pain palpitations.  Sadly EKG confirmed presence of atrial fibrillation.  Past Medical History:  Diagnosis Date   Acute on chronic combined systolic and diastolic CHF (congestive heart failure) ([place]) 03/19/2019   AKI (acute kidney injury) ([place]) 12/26/2014   Arthralgia 11/04/2014   Arthritis    "proabably"   Atypical mycobacterium infection    Cancer [facility])    family unaware of this hx on 5/70/1779   Chronic systolic heart failure (HCC)    Decreased oral intake 12/26/2014   Decreased oral intake  12/26/2014   Depressed left ventricular ejection fraction 02/05/2020   Dilated cardiomyopathy ([place]) 03/19/2019   Ejection fraction 20 to 25% based on echocardiogram done by pulmonologist month ago.   DJD (degenerative joint disease) 10/07/2014   Dyspnea on exertion 03/19/2019   Essential hypertension 02/05/2020   GERD (gastroesophageal reflux disease)    Hammer toes of both feet 01/30/2018   History of thyroid cancer 12/16/2016   Hypercalcemia 12/26/2014   Hypothyroidism    Hypothyroidism associated with surgical procedure 10/07/2014   Lung mass    Myalgia 11/04/2014   Mycobacterium avium complex ([place])    Nail dystrophy 01/30/2018   Occult malignancy (HCC)    PAF (paroxysmal atrial fibrillation) ([place]) 02/05/2020   Persistent atrial fibrillation ([place]) 03/19/2019   Chads 2 Vascor equals 3   PONV (postoperative nausea and vomiting)    Pre-ulcerative calluses 01/30/2018   Pseudoaneurysm following procedure ([place]) 04/16/2020   Sensorineural hearing loss (SNHL), bilateral 08/11/2018   Shingles    Toenail fungus 01/30/2018   Wears glasses     Past Surgical History:  Procedure Laterality Date   APPENDECTOMY     CATARACT EXTRACTION W/ INTRAOCULAR LENS  IMPLANT, BILATERAL Bilateral    COLONOSCOPY     INGUINAL HERNIA REPAIR Left    LEFT HEART CATH AND CORONARY ANGIOGRAPHY N/A 03/11/2020   Procedure: LEFT HEART CATH AND CORONARY ANGIOGRAPHY;  Surgeon: Jettie Booze, MD;  Location: [facility];  Service: Cardiovascular;  Laterality: N/A;   MASS EXCISION Right 08/27/2014  Procedure: EXCISION MASS RIGHT PALM/INDEX;  Surgeon: Leanora Cover, MD;  Location: [place];  Service: Orthopedics;  Laterality: Right;   THYROIDECTOMY  2003    Current Medications: Current Meds  Medication Sig   carvedilol (COREG) 6.25 MG tablet TAKE 1 TABLET BY MOUTH TWICE A DAY (Patient taking differently: Take 6.25 mg by mouth 2 (two) times daily with a meal.)   ELIQUIS 5 MG TABS tablet TAKE 1 TABLET BY  MOUTH TWICE A DAY (Patient taking differently: Take 5 mg by mouth 2 (two) times daily. TAKE 1 TABLET BY MOUTH TWICE A DAY)   furosemide (LASIX) 40 MG tablet Take 1 tablet (40 mg total) by mouth daily.   montelukast (SINGULAIR) 10 MG tablet Take 10 mg by mouth at bedtime.   Multiple Vitamins-Minerals (PRESERVISION AREDS 2 PO) Take 1 capsule by mouth 2 (two) times daily.   sacubitril-valsartan (ENTRESTO) 49-51 MG Take 1 tablet by mouth 2 (two) times daily.   SYNTHROID 175 MCG tablet Take 175 mcg by mouth daily.   [DISCONTINUED] amiodarone (PACERONE) 200 MG tablet TAKE 1 TABLET BY MOUTH TWICE A DAY     Allergies:   Patient has no known allergies.   Social History   Socioeconomic History   Marital status: Married    Spouse name: Not on file   Number of children: Not on file   Years of education: Not on file   Highest education level: Not on file  Occupational History   Not on file  Tobacco Use   Smoking status: Never   Smokeless tobacco: Never  Substance and Sexual Activity   Alcohol use: No   Drug use: No   Sexual activity: Not on file  Other Topics Concern   Not on file  Social History Narrative   Not on file   Social Determinants of Health   Financial Resource Strain: Not on file  Food Insecurity: Not on file  Transportation Needs: Not on file  Physical Activity: Not on file  Stress: Not on file  Social Connections: Not on file     Family History: The patient's family history is not on file. ROS:   Please see the history of present illness.    All 14 point review of systems negative except as described per history of present illness  EKGs/Labs/Other Studies Reviewed:      Recent Labs: 06/18/2022: BUN 30; Creatinine, Ser 1.35; Magnesium 2.5; NT-Pro BNP 1,795; Potassium 4.9; Sodium 141  Recent Lipid Panel    Component Value Date/Time   LDLDIRECT 80 11/20/2021 1523    Physical Exam:    VS:  BP 120/72 (BP Location: Left Arm, Patient Position: Sitting)   Pulse  94   Ht _0  (1.803 m)   Wt 147 lb 6.4 oz (66.9 kg)   SpO2 97%   BMI 20.56 kg/m     Wt Readings from Last 3 Encounters:  07/26/22 147 lb 6.4 oz (66.9 kg)  06/18/22 143 lb 9.6 oz (65.1 kg)  06/07/22 150 lb 3.2 oz (68.1 kg)     GEN:  Well nourished, well developed in no acute distress HEENT: Normal NECK: No JVD; No carotid bruits LYMPHATICS: No lymphadenopathy CARDIAC: Irregularly irregular, no murmurs, no rubs, no gallops RESPIRATORY:  Clear to auscultation without rales, wheezing or rhonchi  ABDOMEN: Soft, non-tender, non-distended MUSCULOSKELETAL:  No edema; No deformity  SKIN: Warm and dry LOWER EXTREMITIES: no swelling NEUROLOGIC:  Alert and oriented x 3 PSYCHIATRIC:  Normal affect   ASSESSMENT:  1. Dilated cardiomyopathy ([place])   2. Persistent atrial fibrillation ([place])   3. Essential hypertension   4. Dyspnea on exertion    PLAN:    In order of problems listed above:  Dilated cardiomyopathy trying to gradually put him on guideline directed medical therapy.  He is already on Coreg Entresto Aldactone, we will add [place] today.  In about 6 weeks to 2 months we will repeat echocardiogram to check left ventricle ejection fraction Atrial fibrillation which is persistent.  I brought the issue of cardioversion and he is not interested he said he is feeling fine does not want to do it but accepted my offer to recheck his left ventricle ejection fraction about 6 weeks.  If ejection fraction is diminished and will go for potential cardioversion. Essential hypertension: Blood pressure actually is on the lower side continue present management. Dyspnea exertion: Stable.   Medication Adjustments/Labs and Tests Ordered: Current medicines are reviewed at length with the patient today.  Concerns regarding medicines are outlined above.  No orders of the defined types were placed in this encounter.  Medication changes: No orders of the defined types were placed in this  encounter.   Signed, Park Liter, MD, [facility] 07/26/2022 3:19 PM    [place]

## 2022-07-26 NOTE — Patient Instructions (Signed)
Medication Instructions:  Your physician has recommended you make the following change in your medication:   START: Farxiga '10mg'$  daily by mouth   DECREASE: Lasix '20mg'$  daily by mouth   Lab Work: Your physician recommends that you return for lab work in: 1 week.  You can come Monday through Friday 8:30 am to 12:00 pm and 1:15 to 4:30. You do not need to make an appointment as the order has already been placed. The labs you are going to have done are BMET   Testing/Procedures: None Ordered   Follow-Up: At [facility], you and your health needs are our priority.  As part of our continuing mission to provide you with exceptional heart care, we have created designated Provider Care Teams.  These Care Teams include your primary Cardiologist (physician) and Advanced Practice Providers (APPs -  Physician Assistants and Nurse Practitioners) who all work together to provide you with the care you need, when you need it.  We recommend signing up for the patient portal called "MyChart".  Sign up information is provided on this After Visit Summary.  MyChart is used to connect with patients for Virtual Visits (Telemedicine).  Patients are able to view lab/test results, encounter notes, upcoming appointments, etc.  Non-urgent messages can be sent to your provider as well.   To learn more about what you can do with MyChart, go to [email or web address].    Your next appointment:   6 week(s)  The format for your next appointment:   In Person  Provider:   Jenne Campus, MD    Other Instructions NA

## 2022-07-27 NOTE — Addendum Note (Signed)
Addended by: Truddie Hidden on: 07/27/2022 08:06 AM   Modules accepted: Orders

## 2022-07-30 DIAGNOSIS — I10 Essential (primary) hypertension: Secondary | ICD-10-CM | POA: Diagnosis not present

## 2022-07-31 LAB — BASIC METABOLIC PANEL
BUN/Creatinine Ratio: 19 (ref 10–24)
BUN: 26 mg/dL (ref 8–27)
CO2: 23 mmol/L (ref 20–29)
Calcium: 10.3 mg/dL — ABNORMAL HIGH (ref 8.6–10.2)
Chloride: 103 mmol/L (ref 96–106)
Creatinine, Ser: 1.38 mg/dL — ABNORMAL HIGH (ref 0.76–1.27)
Glucose: 96 mg/dL (ref 70–99)
Potassium: 4.6 mmol/L (ref 3.5–5.2)
Sodium: 140 mmol/L (ref 134–144)
eGFR: 49 mL/min/{1.73_m2} — ABNORMAL LOW (ref >59)

## 2022-08-03 ENCOUNTER — Telehealth: Payer: Self-pay

## 2022-08-03 NOTE — Telephone Encounter (Signed)
Patient notified of [employer] application was approved. Informed of next step. Letter of approval under media

## 2022-08-03 NOTE — Telephone Encounter (Signed)
[employer] application for [place] completed and faxed

## 2022-08-09 NOTE — Telephone Encounter (Addendum)
Patient aware application for [place] approved and will be expecting a call from the foundation for shipment arrangement. He is also aware approval are good til the end of the calender year. Letter of approval scanned under media

## 2022-08-12 ENCOUNTER — Other Ambulatory Visit: Payer: Self-pay

## 2022-08-15 ENCOUNTER — Other Ambulatory Visit: Payer: Self-pay | Admitting: Cardiology

## 2022-09-02 DIAGNOSIS — H35373 Puckering of macula, bilateral: Secondary | ICD-10-CM | POA: Diagnosis not present

## 2022-09-02 DIAGNOSIS — H353221 Exudative age-related macular degeneration, left eye, with active choroidal neovascularization: Secondary | ICD-10-CM | POA: Diagnosis not present

## 2022-09-02 DIAGNOSIS — H353112 Nonexudative age-related macular degeneration, right eye, intermediate dry stage: Secondary | ICD-10-CM | POA: Diagnosis not present

## 2022-09-02 DIAGNOSIS — H47393 Other disorders of optic disc, bilateral: Secondary | ICD-10-CM | POA: Diagnosis not present

## 2022-09-06 ENCOUNTER — Encounter: Payer: Self-pay | Admitting: Cardiology

## 2022-09-06 ENCOUNTER — Ambulatory Visit: Payer: Medicare Other | Attending: Cardiology | Admitting: Cardiology

## 2022-09-06 VITALS — BP 104/62 | HR 77 | Ht 71.0 in | Wt 150.0 lb

## 2022-09-06 DIAGNOSIS — I420 Dilated cardiomyopathy: Secondary | ICD-10-CM | POA: Diagnosis not present

## 2022-09-06 DIAGNOSIS — R0609 Other forms of dyspnea: Secondary | ICD-10-CM | POA: Diagnosis not present

## 2022-09-06 DIAGNOSIS — I10 Essential (primary) hypertension: Secondary | ICD-10-CM

## 2022-09-06 DIAGNOSIS — I480 Paroxysmal atrial fibrillation: Secondary | ICD-10-CM

## 2022-09-06 NOTE — Addendum Note (Signed)
Addended by: Jacobo Forest D on: 09/06/2022 03:47 PM   Modules accepted: Orders

## 2022-09-06 NOTE — Progress Notes (Signed)
Cardiology Office Note:    Date:  09/06/2022   ID:  Curtis Ramos, DOB 1932/10/22, MRN [id number]  PCP:  Leonides Sake, MD  Cardiologist:  Jenne Campus, MD    Referring MD: Leonides Sake, MD   Chief Complaint  Patient presents with   Follow-up  Doing very well  History of Present Illness:    Curtis Ramos is a 88 y.o. male   with past medical history significant for nonischemic cardiomyopathy initially diagnosed in 2020 with ejection fraction about 25%, he was also in atrial fibrillation.  He is anticoagulated put on guideline directed medical therapy converted spontaneously to sinus rhythm and his left ventricle ejection fraction normalized he also had cardiac catheterization 2021 which showed practically normal coronaries.  Nonobstructive disease.  Recently he showed up in my office because of shortness of breath he was find to be back in atrial fibrillation eventually end up being put in the hospital diuresis.  Echocardiogram performed showed again weakening of his heart with ejection fraction about 25 to 30%.  He was put on guideline directed medical therapy in the hospital diuresed and discharged home Comes today to my office for follow-up he is doing great he still busy working on the house has no difficulty doing it.  Denies of any chest pain tightness squeezing pressure burning chest no palpitations no dizziness  Past Medical History:  Diagnosis Date   Acute on chronic combined systolic and diastolic CHF (congestive heart failure) ([place]) 03/19/2019   AKI (acute kidney injury) ([place]) 12/26/2014   Arthralgia 11/04/2014   Arthritis    "proabably"   Atypical mycobacterium infection    Cancer [facility])    family unaware of this hx on 6/72/0947   Chronic systolic heart failure (HCC)    Decreased oral intake 12/26/2014   Decreased oral intake 12/26/2014   Depressed left ventricular ejection fraction 02/05/2020   Dilated cardiomyopathy ([place]) 03/19/2019   Ejection fraction 20 to 25%  based on echocardiogram done by pulmonologist month ago.   DJD (degenerative joint disease) 10/07/2014   Dyspnea on exertion 03/19/2019   Essential hypertension 02/05/2020   GERD (gastroesophageal reflux disease)    Hammer toes of both feet 01/30/2018   History of thyroid cancer 12/16/2016   Hypercalcemia 12/26/2014   Hypothyroidism    Hypothyroidism associated with surgical procedure 10/07/2014   Lung mass    Myalgia 11/04/2014   Mycobacterium avium complex ([place])    Nail dystrophy 01/30/2018   Occult malignancy (HCC)    PAF (paroxysmal atrial fibrillation) ([place]) 02/05/2020   Persistent atrial fibrillation ([place]) 03/19/2019   Chads 2 Vascor equals 3   PONV (postoperative nausea and vomiting)    Pre-ulcerative calluses 01/30/2018   Pseudoaneurysm following procedure ([place]) 04/16/2020   Sensorineural hearing loss (SNHL), bilateral 08/11/2018   Shingles    Toenail fungus 01/30/2018   Wears glasses     Past Surgical History:  Procedure Laterality Date   APPENDECTOMY     CATARACT EXTRACTION W/ INTRAOCULAR LENS  IMPLANT, BILATERAL Bilateral    COLONOSCOPY     INGUINAL HERNIA REPAIR Left    LEFT HEART CATH AND CORONARY ANGIOGRAPHY N/A 03/11/2020   Procedure: LEFT HEART CATH AND CORONARY ANGIOGRAPHY;  Surgeon: Jettie Booze, MD;  Location: [place] [facility];  Service: Cardiovascular;  Laterality: N/A;   MASS EXCISION Right 08/27/2014   Procedure: EXCISION MASS RIGHT PALM/INDEX;  Surgeon: Leanora Cover, MD;  Location: [place];  Service: Orthopedics;  Laterality: Right;  THYROIDECTOMY  2003    Current Medications: Current Meds  Medication Sig   carvedilol (COREG) 6.25 MG tablet TAKE 1 TABLET BY MOUTH TWICE A DAY (Patient taking differently: Take 6.25 mg by mouth 2 (two) times daily with a meal.)   dapagliflozin propanediol (FARXIGA) 10 MG TABS tablet Take 1 tablet (10 mg total) by mouth daily before breakfast.   ELIQUIS 5 MG TABS tablet TAKE 1 TABLET BY MOUTH TWICE A DAY  (Patient taking differently: Take 5 mg by mouth 2 (two) times daily. TAKE 1 TABLET BY MOUTH TWICE A DAY)   furosemide (LASIX) 20 MG tablet Take 1 tablet (20 mg total) by mouth daily.   montelukast (SINGULAIR) 10 MG tablet Take 10 mg by mouth at bedtime.   Multiple Vitamins-Minerals (PRESERVISION AREDS 2 PO) Take 1 capsule by mouth 2 (two) times daily.   sacubitril-valsartan (ENTRESTO) 49-51 MG Take 1 tablet by mouth 2 (two) times daily.   SYNTHROID 175 MCG tablet Take 175 mcg by mouth daily.     Allergies:   Patient has no known allergies.   Social History   Socioeconomic History   Marital status: Married    Spouse name: Not on file   Number of children: Not on file   Years of education: Not on file   Highest education level: Not on file  Occupational History   Not on file  Tobacco Use   Smoking status: Never   Smokeless tobacco: Never  Substance and Sexual Activity   Alcohol use: No   Drug use: No   Sexual activity: Not on file  Other Topics Concern   Not on file  Social History Narrative   Not on file   Social Determinants of Health   Financial Resource Strain: Not on file  Food Insecurity: Not on file  Transportation Needs: Not on file  Physical Activity: Not on file  Stress: Not on file  Social Connections: Not on file     Family History: The patient's family history is not on file. ROS:   Please see the history of present illness.    All 14 point review of systems negative except as described per history of present illness  EKGs/Labs/Other Studies Reviewed:      Recent Labs: 06/18/2022: Magnesium 2.5; NT-Pro BNP 1,795 07/30/2022: BUN 26; Creatinine, Ser 1.38; Potassium 4.6; Sodium 140  Recent Lipid Panel    Component Value Date/Time   LDLDIRECT 80 11/20/2021 1523    Physical Exam:    VS:  BP 104/62 (BP Location: Left Arm, Patient Position: Sitting)   Pulse 77   Ht _0  (1.803 m)   Wt 150 lb (68 kg)   SpO2 97%   BMI 20.92 kg/m     Wt Readings  from Last 3 Encounters:  09/06/22 150 lb (68 kg)  07/26/22 147 lb 6.4 oz (66.9 kg)  06/18/22 143 lb 9.6 oz (65.1 kg)     GEN:  Well nourished, well developed in no acute distress HEENT: Normal NECK: No JVD; No carotid bruits LYMPHATICS: No lymphadenopathy CARDIAC: Irregularly irregular, no murmurs, no rubs, no gallops RESPIRATORY:  Clear to auscultation without rales, wheezing or rhonchi  ABDOMEN: Soft, non-tender, non-distended MUSCULOSKELETAL:  No edema; No deformity  SKIN: Warm and dry LOWER EXTREMITIES: no swelling NEUROLOGIC:  Alert and oriented x 3 PSYCHIATRIC:  Normal affect   ASSESSMENT:    1. Dilated cardiomyopathy ([place])   2. PAF (paroxysmal atrial fibrillation) ([place])   3. Essential hypertension   4. Dyspnea  on exertion    PLAN:    In order of problems listed above:  Cardiomyopathy on guideline directed medical therapy that he is able to tolerate.  Doing well compensated on physical exam I will schedule him to have limited echo to check left ventricle ejection fraction.  He looks clinically so good that I suspect and hope that his ejection fraction improves Permanent atrial fibrillation, his rate is controlled he is anticoagulated again and brought an issue of cardioversion he does not want to do it he said Dr. I am doing well and I am doing fine. Essential hypertension blood pressure well controlled continue present management. This exertion denies having any   Medication Adjustments/Labs and Tests Ordered: Current medicines are reviewed at length with the patient today.  Concerns regarding medicines are outlined above.  No orders of the defined types were placed in this encounter.  Medication changes: No orders of the defined types were placed in this encounter.   Signed, Park Liter, MD, [facility] 09/06/2022 3:42 PM    [place]

## 2022-09-06 NOTE — Patient Instructions (Addendum)
Medication Instructions:  Your physician recommends that you continue on your current medications as directed. Please refer to the Current Medication list given to you today.  *If you need a refill on your cardiac medications before your next appointment, please call your pharmacy*   Lab Work: None Ordered If you have labs (blood work) drawn today and your tests are completely normal, you will receive your results only by: MyChart Message (if you have MyChart) OR A paper copy in the mail If you have any lab test that is abnormal or we need to change your treatment, we will call you to review the results.   Testing/Procedures: Your physician has requested that you have an echocardiogram. Echocardiography is a painless test that uses sound waves to create images of your heart. It provides your doctor with information about the size and shape of your heart and how well your heart's chambers and valves are working. This procedure takes approximately one hour. There are no restrictions for this procedure. Please do NOT wear cologne, perfume, aftershave, or lotions (deodorant is allowed). Please arrive 15 minutes prior to your appointment time.    Follow-Up: At [facility], you and your health needs are our priority.  As part of our continuing mission to provide you with exceptional heart care, we have created designated Provider Care Teams.  These Care Teams include your primary Cardiologist (physician) and Advanced Practice Providers (APPs -  Physician Assistants and Nurse Practitioners) who all work together to provide you with the care you need, when you need it.  We recommend signing up for the patient portal called "MyChart".  Sign up information is provided on this After Visit Summary.  MyChart is used to connect with patients for Virtual Visits (Telemedicine).  Patients are able to view lab/test results, encounter notes, upcoming appointments, etc.  Non-urgent messages can be sent to your  provider as well.   To learn more about what you can do with MyChart, go to [email or web address].com.    Your next appointment:   3 month(s)  The format for your next appointment:   In Person  Provider:   Robert Krasowski, MD    Other Instructions NA  

## 2022-09-07 NOTE — Addendum Note (Signed)
Addended by: Darius Bump B on: 09/07/2022 12:45 PM   Modules accepted: Orders

## 2022-09-09 DIAGNOSIS — D225 Melanocytic nevi of trunk: Secondary | ICD-10-CM | POA: Diagnosis not present

## 2022-09-09 DIAGNOSIS — L570 Actinic keratosis: Secondary | ICD-10-CM | POA: Diagnosis not present

## 2022-09-09 DIAGNOSIS — L820 Inflamed seborrheic keratosis: Secondary | ICD-10-CM | POA: Diagnosis not present

## 2022-09-09 DIAGNOSIS — L578 Other skin changes due to chronic exposure to nonionizing radiation: Secondary | ICD-10-CM | POA: Diagnosis not present

## 2022-09-09 DIAGNOSIS — D485 Neoplasm of uncertain behavior of skin: Secondary | ICD-10-CM | POA: Diagnosis not present

## 2022-09-10 ENCOUNTER — Ambulatory Visit: Payer: Medicare Other | Admitting: Cardiology

## 2022-09-16 ENCOUNTER — Telehealth: Payer: Self-pay

## 2022-09-16 NOTE — Telephone Encounter (Signed)
LM and my chart messaged the patient regarding renewal application for Entresto. Explained I mailed application and to please complete patient section and mail back with proof of income.

## 2022-09-16 NOTE — Telephone Encounter (Signed)
Spoke with patient discussed the following below.

## 2022-09-16 NOTE — Telephone Encounter (Signed)
Patient is returning call.  °

## 2022-09-17 ENCOUNTER — Telehealth: Payer: Self-pay | Admitting: Cardiology

## 2022-09-17 NOTE — Telephone Encounter (Signed)
Pt calling for lab results from 07/30/22

## 2022-09-17 NOTE — Telephone Encounter (Signed)
Left message for patient informing him labwork from 07/30/22 looked good and to continue current medication per Dr. Agustin Cree.  Provided office number for callback if any questions.    Patient returned call while typing this note.  Discussed lab results with patient and to continue current medications.  Patient verbalized understanding and expressed appreciation for call.

## 2022-09-21 ENCOUNTER — Ambulatory Visit: Payer: Medicare Other | Attending: Cardiology

## 2022-09-21 DIAGNOSIS — I420 Dilated cardiomyopathy: Secondary | ICD-10-CM | POA: Diagnosis not present

## 2022-09-21 DIAGNOSIS — R0609 Other forms of dyspnea: Secondary | ICD-10-CM | POA: Diagnosis not present

## 2022-09-22 DIAGNOSIS — H524 Presbyopia: Secondary | ICD-10-CM | POA: Diagnosis not present

## 2022-09-22 LAB — ECHOCARDIOGRAM LIMITED
Area-P 1/2: 5.27 cm2
MV M vel: 4.29 m/s
MV Peak grad: 73.6 mmHg
Radius: 0.3 cm
S' Lateral: 5.3 cm

## 2022-09-30 ENCOUNTER — Telehealth: Payer: Self-pay | Admitting: Cardiology

## 2022-09-30 NOTE — Telephone Encounter (Signed)
[employer] application complete and faxed

## 2022-09-30 NOTE — Telephone Encounter (Signed)
New Message:     Patient wants Dr Agustin Cree to know that he is ready to have the procedure to have his heart shocked. He wants is asap.     Pt c/o Shortness Of Breath: STAT if SOB developed within the last 24 hours or pt is noticeably SOB on the phone  1. Are you currently SOB (can you hear that pt is SOB on the phone)?  Short of breath at this time  2. How long have you been experiencing SOB? 2 weeks  3. Are you SOB when sitting or when up moving around? Both, sitting  and moving  4.  Are you currently experiencing any other symptoms? No other symptoms

## 2022-10-01 NOTE — Telephone Encounter (Signed)
Spoke with pt regarding message. Pt reported that he does not feel bad, he is just "short of Air" for about 2 weeks. Dr. Agustin Cree recommended that he come in next week to be checked. Pt agreed. Sent to front desk to have appt scheduled. Pt had no further questions. Recommended that he go to ER if his symptoms worsened. Pt agreed and verbalized understanding.

## 2022-10-05 ENCOUNTER — Encounter: Payer: Self-pay | Admitting: Cardiology

## 2022-10-05 ENCOUNTER — Ambulatory Visit: Payer: Medicare Other | Attending: Cardiology | Admitting: Cardiology

## 2022-10-05 VITALS — BP 104/62 | HR 58 | Ht 71.0 in | Wt 155.6 lb

## 2022-10-05 DIAGNOSIS — I420 Dilated cardiomyopathy: Secondary | ICD-10-CM

## 2022-10-05 DIAGNOSIS — I4821 Permanent atrial fibrillation: Secondary | ICD-10-CM | POA: Diagnosis not present

## 2022-10-05 DIAGNOSIS — I4819 Other persistent atrial fibrillation: Secondary | ICD-10-CM

## 2022-10-05 DIAGNOSIS — R0609 Other forms of dyspnea: Secondary | ICD-10-CM | POA: Diagnosis not present

## 2022-10-05 DIAGNOSIS — I10 Essential (primary) hypertension: Secondary | ICD-10-CM

## 2022-10-05 DIAGNOSIS — I5043 Acute on chronic combined systolic (congestive) and diastolic (congestive) heart failure: Secondary | ICD-10-CM | POA: Diagnosis not present

## 2022-10-05 DIAGNOSIS — I5022 Chronic systolic (congestive) heart failure: Secondary | ICD-10-CM

## 2022-10-05 NOTE — Patient Instructions (Signed)
Medication Instructions:  Your physician recommends that you continue on your current medications as directed. Please refer to the Current Medication list given to you today.  *If you need a refill on your cardiac medications before your next appointment, please call your pharmacy*   Lab Work: None Ordered If you have labs (blood work) drawn today and your tests are completely normal, you will receive your results only by: [place] (if you have MyChart) OR A paper copy in the mail If you have any lab test that is abnormal or we need to change your treatment, we will call you to review the results.   Testing/Procedures: None Ordered   Follow-Up: At [facility], you and your health needs are our priority.  As part of our continuing mission to provide you with exceptional heart care, we have created designated Provider Care Teams.  These Care Teams include your primary Cardiologist (physician) and Advanced Practice Providers (APPs -  Physician Assistants and Nurse Practitioners) who all work together to provide you with the care you need, when you need it.  We recommend signing up for the patient portal called "MyChart".  Sign up information is provided on this After Visit Summary.  MyChart is used to connect with patients for Virtual Visits (Telemedicine).  Patients are able to view lab/test results, encounter notes, upcoming appointments, etc.  Non-urgent messages can be sent to your provider as well.   To learn more about what you can do with MyChart, go to [email or web address].    Your next appointment:   1 month(s)  The format for your next appointment:   In Person  Provider:   Jenne Campus, MD    Other Instructions Referral to CHF clinic- they will call for appt

## 2022-10-05 NOTE — Progress Notes (Addendum)
Cardiology Office Note:    Date:  10/05/2022   ID:  Curtis Ramos, DOB 03/14/1932, MRN [id number]  PCP:  Leonides Sake, MD  Cardiologist:  Jenne Campus, MD    Referring MD: Leonides Sake, MD   Chief Complaint  Patient presents with   fluid retention    History of Present Illness:    Curtis Ramos is a 88 y.o. male with past medical history significant for nonischemic cardiomyopathy with initial ejection fraction 20 to 25% in May 2020, he was also noted to have atrial fibrillation at that time, he was put on guideline directed medical therapy his ejection fraction improved to almost normal.  However couple years later he noticed again shortness of breath was find out again deterioration of his left ventricle ejection fraction.  Last echocardiogram done in November showed ejection fraction 20 to 25%.  Evaluation for coronary artery disease was done in April 2021 in [place] of cardiac catheterization which showed no minimal nonobstructive disease, he also got atrial fibrillation, severely enlarged both 3 years and he is not interested in cardioversion, therefore he was declared as permanent atrial fibrillation, he is anticoagulated. Comes today to my officefor follow-up on Saturday he noted shortness of breath as well as swelling of lower extremities increase Lasix by himself and stabilized again today he looks good.  He denies having any chest pain shortness of breath tightness squeezing pressure burning chest.  Overall back to himself.  Past Medical History:  Diagnosis Date   Acute on chronic combined systolic and diastolic CHF (congestive heart failure) ([place]) 03/19/2019   AKI (acute kidney injury) ([place]) 12/26/2014   Arthralgia 11/04/2014   Arthritis    "proabably"   Atypical mycobacterium infection    Cancer [facility])    family unaware of this hx on 5/91/6384   Chronic systolic heart failure (HCC)    Decreased oral intake 12/26/2014   Decreased oral intake 12/26/2014   Depressed left  ventricular ejection fraction 02/05/2020   Dilated cardiomyopathy ([place]) 03/19/2019   Ejection fraction 20 to 25% based on echocardiogram done by pulmonologist month ago.   DJD (degenerative joint disease) 10/07/2014   Dyspnea on exertion 03/19/2019   Essential hypertension 02/05/2020   GERD (gastroesophageal reflux disease)    Hammer toes of both feet 01/30/2018   History of thyroid cancer 12/16/2016   Hypercalcemia 12/26/2014   Hypothyroidism    Hypothyroidism associated with surgical procedure 10/07/2014   Lung mass    Myalgia 11/04/2014   Mycobacterium avium complex ([place])    Nail dystrophy 01/30/2018   Occult malignancy (HCC)    PAF (paroxysmal atrial fibrillation) ([place]) 02/05/2020   Persistent atrial fibrillation ([place]) 03/19/2019   Chads 2 Vascor equals 3   PONV (postoperative nausea and vomiting)    Pre-ulcerative calluses 01/30/2018   Pseudoaneurysm following procedure ([place]) 04/16/2020   Sensorineural hearing loss (SNHL), bilateral 08/11/2018   Shingles    Toenail fungus 01/30/2018   Wears glasses     Past Surgical History:  Procedure Laterality Date   APPENDECTOMY     CATARACT EXTRACTION W/ INTRAOCULAR LENS  IMPLANT, BILATERAL Bilateral    COLONOSCOPY     INGUINAL HERNIA REPAIR Left    LEFT HEART CATH AND CORONARY ANGIOGRAPHY N/A 03/11/2020   Procedure: LEFT HEART CATH AND CORONARY ANGIOGRAPHY;  Surgeon: Jettie Booze, MD;  Location: [place] [facility];  Service: Cardiovascular;  Laterality: N/A;   MASS EXCISION Right 08/27/2014   Procedure: EXCISION MASS RIGHT PALM/INDEX;  Surgeon:  Leanora Cover, MD;  Location: [place];  Service: Orthopedics;  Laterality: Right;   THYROIDECTOMY  2003    Current Medications: Current Meds  Medication Sig   amiodarone (PACERONE) 200 MG tablet Take 200 mg by mouth daily.   carvedilol (COREG) 6.25 MG tablet TAKE 1 TABLET BY MOUTH TWICE A DAY (Patient taking differently: Take 6.25 mg by mouth 2 (two) times daily with a meal.)    dapagliflozin propanediol (FARXIGA) 10 MG TABS tablet Take 1 tablet (10 mg total) by mouth daily before breakfast.   ELIQUIS 5 MG TABS tablet TAKE 1 TABLET BY MOUTH TWICE A DAY (Patient taking differently: Take 5 mg by mouth 2 (two) times daily. TAKE 1 TABLET BY MOUTH TWICE A DAY)   furosemide (LASIX) 20 MG tablet Take 1 tablet (20 mg total) by mouth daily. (Patient taking differently: Take 40 mg by mouth daily.)   sacubitril-valsartan (ENTRESTO) 49-51 MG Take 1 tablet by mouth 2 (two) times daily.   SYNTHROID 175 MCG tablet Take 175 mcg by mouth daily.   [DISCONTINUED] montelukast (SINGULAIR) 10 MG tablet Take 10 mg by mouth at bedtime.   [DISCONTINUED] Multiple Vitamins-Minerals (PRESERVISION AREDS 2 PO) Take 1 capsule by mouth 2 (two) times daily.     Allergies:   Patient has no known allergies.   Social History   Socioeconomic History   Marital status: Married    Spouse name: Not on file   Number of children: Not on file   Years of education: Not on file   Highest education level: Not on file  Occupational History   Not on file  Tobacco Use   Smoking status: Never   Smokeless tobacco: Never  Substance and Sexual Activity   Alcohol use: No   Drug use: No   Sexual activity: Not on file  Other Topics Concern   Not on file  Social History Narrative   Not on file   Social Determinants of Health   Financial Resource Strain: Not on file  Food Insecurity: Not on file  Transportation Needs: Not on file  Physical Activity: Not on file  Stress: Not on file  Social Connections: Not on file     Family History: The patient's family history is not on file. ROS:   Please see the history of present illness.    All 14 point review of systems negative except as described per history of present illness  EKGs/Labs/Other Studies Reviewed:      Recent Labs: 06/18/2022: Magnesium 2.5; NT-Pro BNP 1,795 07/30/2022: BUN 26; Creatinine, Ser 1.38; Potassium 4.6; Sodium 140  Recent Lipid  Panel    Component Value Date/Time   LDLDIRECT 80 11/20/2021 1523    Physical Exam:    VS:  BP 104/62 (BP Location: Left Arm, Patient Position: Sitting)   Pulse (!) 58   Ht _0  (1.803 m)   Wt 155 lb 9.6 oz (70.6 kg)   SpO2 95%   BMI 21.70 kg/m     Wt Readings from Last 3 Encounters:  10/05/22 155 lb 9.6 oz (70.6 kg)  09/06/22 150 lb (68 kg)  07/26/22 147 lb 6.4 oz (66.9 kg)     GEN:  Well nourished, well developed in no acute distress HEENT: Normal NECK: No JVD; No carotid bruits LYMPHATICS: No lymphadenopathy CARDIAC: Irregularly lower, soft systolic murmur grade 1 out of 6 best heard at the apex, no rubs, no gallops RESPIRATORY:  Clear to auscultation without rales, wheezing or rhonchi  ABDOMEN: Soft,  non-tender, non-distended MUSCULOSKELETAL:  No edema; No deformity  SKIN: Warm and dry LOWER EXTREMITIES: no swelling NEUROLOGIC:  Alert and oriented x 3 PSYCHIATRIC:  Normal affect   ASSESSMENT:    1. Dyspnea on exertion   2. Persistent atrial fibrillation ([place])   3. Acute on chronic combined systolic and diastolic CHF (congestive heart failure) ([place])   4. Dilated cardiomyopathy ([place])   5. Permanent atrial fibrillation ([place])   6. Essential hypertension   7. Chronic systolic heart failure (HCC)    PLAN:    In order of problems listed above:  Dyspnea on exertion" by decompensated CHF.  Difficult to figure out what triggered that event.  But overall he is doing well right now.  He is taking furosemide 40 mg daily.  I will check proBNP as well as Chem-7 today.  He is already on Entresto, Farxiga, he is not Aldactone because of problem with hyperkalemia, Persistent atrial fibrillation, he is not interested in cardioversion, he is anticoagulated which I will continue Essential hypertension have of the problem right now with blood pressure being low we will continue monitoring He will be referred to advanced congestive heart failure clinic for help managing this  problem.  He will consider also referral to EP for consideration of ICD [place] however I will make sure there is nothing else we can do to improve his left ventricle ejection fraction before committing him to ICD.  He does have significant left bundle branch block, therefore BiV pacing should be considered we will see him next month and revisit that issue hopefully he agree to proceed   Medication Adjustments/Labs and Tests Ordered: Current medicines are reviewed at length with the patient today.  Concerns regarding medicines are outlined above.  Orders Placed This Encounter  Procedures   Basic metabolic panel   Pro b natriuretic peptide (BNP)   AMB referral to CHF clinic   EKG 12-Lead   Medication changes: No orders of the defined types were placed in this encounter.   Signed, Park Liter, MD, [facility] 10/05/2022 8:44 AM    [place]

## 2022-10-06 LAB — BASIC METABOLIC PANEL
BUN/Creatinine Ratio: 17 (ref 10–24)
BUN: 33 mg/dL — ABNORMAL HIGH (ref 8–27)
CO2: 27 mmol/L (ref 20–29)
Calcium: 9.2 mg/dL (ref 8.6–10.2)
Chloride: 99 mmol/L (ref 96–106)
Creatinine, Ser: 1.96 mg/dL — ABNORMAL HIGH (ref 0.76–1.27)
Glucose: 74 mg/dL (ref 70–99)
Potassium: 4.2 mmol/L (ref 3.5–5.2)
Sodium: 138 mmol/L (ref 134–144)
eGFR: 32 mL/min/{1.73_m2} — ABNORMAL LOW (ref >59)

## 2022-10-06 LAB — PRO B NATRIURETIC PEPTIDE: NT-Pro BNP: 3434 pg/mL — ABNORMAL HIGH (ref 0–486)

## 2022-10-06 NOTE — Telephone Encounter (Signed)
Results reviewed with pt's son [name]- per [facility]- as per Dr. Wendy Poet note.  Son verbalized understanding and had no additional questions. Routed to PCP

## 2022-10-14 DIAGNOSIS — C4442 Squamous cell carcinoma of skin of scalp and neck: Secondary | ICD-10-CM | POA: Diagnosis not present

## 2022-10-18 ENCOUNTER — Other Ambulatory Visit: Payer: Self-pay

## 2022-10-18 MED ORDER — DAPAGLIFLOZIN PROPANEDIOL 10 MG PO TABS: 10.0000 mg | ORAL_TABLET | Freq: Every day | ORAL | 2 refills | Status: DC

## 2022-10-20 ENCOUNTER — Telehealth: Payer: Self-pay | Admitting: Cardiology

## 2022-10-20 NOTE — Telephone Encounter (Signed)
Spoke with pts son per [facility]- LM for pt to call back with any questions.

## 2022-10-20 NOTE — Telephone Encounter (Signed)
Patient said that when he lay down for a while he left side from below his elbow to his fingers but when he sits in a chair, he is fine. Would like to talk with Dr. Agustin Cree or nurse.

## 2022-10-21 NOTE — Telephone Encounter (Signed)
Spoke with pt and advised that we will file his [place] paperwork for his [place] Jan 2024.   Pt also asking about his pain and I advised him that Dede had spoke wit his son. Pt states that he has pain in his elbow and below with numbness in his fingers. Advised to see his PCP as he might have tendonitis. Pt verbalized understanding and had no additional questions.

## 2022-10-27 ENCOUNTER — Telehealth (HOSPITAL_COMMUNITY): Payer: Self-pay | Admitting: Vascular Surgery

## 2022-10-27 DIAGNOSIS — M79642 Pain in left hand: Secondary | ICD-10-CM | POA: Diagnosis not present

## 2022-10-27 DIAGNOSIS — R202 Paresthesia of skin: Secondary | ICD-10-CM | POA: Diagnosis not present

## 2022-10-27 DIAGNOSIS — G5602 Carpal tunnel syndrome, left upper limb: Secondary | ICD-10-CM | POA: Diagnosis not present

## 2022-10-27 NOTE — Telephone Encounter (Signed)
Left pt message giving new pt appt asked t son [name] to call back to confirm 1/23 appt w/ db

## 2022-11-03 ENCOUNTER — Ambulatory Visit: Payer: Medicare Other | Admitting: Cardiology

## 2022-11-03 ENCOUNTER — Encounter: Payer: Self-pay | Admitting: Cardiology

## 2022-11-18 ENCOUNTER — Other Ambulatory Visit: Payer: Self-pay

## 2022-11-18 MED ORDER — ENTRESTO 49-51 MG PO TABS: 1.0000 | ORAL_TABLET | Freq: Two times a day (BID) | ORAL | 3 refills | Status: DC

## 2022-11-18 NOTE — Telephone Encounter (Signed)
Renewal script for [employer] sent to [employer]

## 2022-11-19 ENCOUNTER — Telehealth: Payer: Self-pay

## 2022-11-19 NOTE — Telephone Encounter (Signed)
Application status was pending, information needed has been updated. Awaiting status will be 2-3 days

## 2022-11-23 ENCOUNTER — Telehealth: Payer: Self-pay

## 2022-11-23 NOTE — Telephone Encounter (Signed)
Spoke to [employer] aware [employer]. Letter of approval submitted to scan

## 2022-12-02 ENCOUNTER — Telehealth: Payer: Self-pay

## 2022-12-02 NOTE — Telephone Encounter (Signed)
Entresto application approved until 11/15/2023, letter on the way.

## 2022-12-03 ENCOUNTER — Other Ambulatory Visit: Payer: Self-pay | Admitting: Cardiology

## 2022-12-03 ENCOUNTER — Telehealth: Payer: Self-pay

## 2022-12-03 NOTE — Telephone Encounter (Signed)
[employer] approved, letter on file, patient notified

## 2022-12-03 NOTE — Telephone Encounter (Signed)
[employer] approved, letter on file, patient notified.

## 2022-12-03 NOTE — Telephone Encounter (Signed)
Prescription refill request for Eliquis received. Indication: afib  Last office visit: Agustin Cree, 10/05/2022 Scr: 1.38, 07/30/2022 Age: 89 yo  Weight: 71.8 kg   Refill sent.

## 2022-12-06 DIAGNOSIS — R1030 Lower abdominal pain, unspecified: Secondary | ICD-10-CM | POA: Diagnosis not present

## 2022-12-06 DIAGNOSIS — K5900 Constipation, unspecified: Secondary | ICD-10-CM | POA: Diagnosis not present

## 2022-12-06 DIAGNOSIS — R81 Glycosuria: Secondary | ICD-10-CM | POA: Diagnosis not present

## 2022-12-06 DIAGNOSIS — I4819 Other persistent atrial fibrillation: Secondary | ICD-10-CM | POA: Diagnosis not present

## 2022-12-06 DIAGNOSIS — I5020 Unspecified systolic (congestive) heart failure: Secondary | ICD-10-CM | POA: Diagnosis not present

## 2022-12-06 NOTE — Progress Notes (Addendum)
ADVANCED HF CLINIC CONSULT NOTE  Referring Physician:Dr. Drue Dun Primary Care: Leonides Sake, MD Primary Cardiologist: Dr. Drue Dun   HPI:  Curtis Ramos is a 89 y.o. male with MAI infection, HTN, LBBB, chronic AF and chronic systolic HF due to NICM referred by Dr. Agustin Cree for further evaluation of his HF.   Echo 5/20 EF 20-25% Noted to have atrial fibrillation at that time, he was put on guideline directed medical therapy his ejection fraction improved to almost normal.    Echo 11/20 EF 35-30%  Echo 12/21 EF 55-60%  Cath 4/12 No significant CAD  Echo 11/23 EF 20-25% LV markedly dialted + dyssynchrony - RV ok  Moderate MR/TR severe biatrial enlargement   Has been following with Dr. Agustin Cree. Symptomatically worse over past few weeks. Amiodarone started in anticipation at trial of DCCV for restoring NSR.   Here with his son. Still working Network engineer for furniture. Says over the past 6 weeks has had a lot less energy. Now NYHA IIIB-IV.  Mild LE edema. + orthopnea. No CP. Or palpitations   SPEP January 19, 2015 no M-spike  FHx: Father died of HF and had a "heart skip"   Review of Systems: [y] = yes, _0  = no   General: Weight gain _1 ; Weight loss _2 ; Anorexia _3 ; Fatigue Blue.Reese ]; Fever _4 ; Chills _5 ; Weakness Blue.Reese ]  Cardiac: Chest pain/pressure _6 ; Resting SOB _7 ; Exertional SOB _8 ; Orthopnea _9 ; Pedal Edema Blue.Reese ]; Palpitations _10 ; Syncope _11 ; Presyncope _12 ; Paroxysmal nocturnal dyspnea_13   Pulmonary: Cough _14 ; Wheezing_15 ; Hemoptysis_16 ; Sputum _17 ; Snoring _18   GI: Vomiting_19 ; Dysphagia_20 ; Melena_21 ; Hematochezia _22 ; Heartburn_23 ; Abdominal pain _24 ; Constipation _25 ; Diarrhea _26 ; BRBPR _27   GU: Hematuria_28 ; Dysuria _29 ; Nocturia_30   Vascular: Pain in legs with walking _31 ; Pain in feet with lying flat _32 ; Non-healing sores _33 ; Stroke _34 ; TIA _35 ; Slurred speech _36 ;  Neuro: Headaches_37 ; Vertigo_38 ; Seizures_39 ; Paresthesias_40 ;Blurred vision _41 ; Diplopia [  ]; Vision changes _42   Ortho/Skin: Arthritis Blue.Reese ]; Joint pain [ y]; Muscle pain _43 ; Joint swelling _44 ; Back Pain _45 ; Rash _46   Psych: Depression_47 ; Anxiety_48   Heme: Bleeding problems _49 ; Clotting disorders _50 ; Anemia _51   Endocrine: Diabetes _52 ; Thyroid dysfunction_53    Past Medical History:  Diagnosis Date   Acute on chronic combined systolic and diastolic CHF (congestive heart failure) ([place]) 03/19/2019   AKI (acute kidney injury) ([place]) January 19, 2015   Arthralgia 11/04/2014   Arthritis    "proabably"   Atypical mycobacterium infection    Cancer [facility])    family unaware of this hx on 9/32/3557   Chronic systolic heart failure (HCC)    Decreased oral intake 01-19-15   Decreased oral intake Jan 19, 2015   Depressed left ventricular ejection fraction 02/05/2020   Dilated cardiomyopathy ([place]) 03/19/2019   Ejection fraction 20 to 25% based on echocardiogram done by pulmonologist month ago.   DJD (degenerative joint disease) 10/07/2014   Dyspnea on exertion 03/19/2019   Essential hypertension 02/05/2020   GERD (gastroesophageal reflux disease)    Hammer toes of both feet 01/30/2018   History of thyroid cancer 12/16/2016   Hypercalcemia 01/19/2015   Hypothyroidism    Hypothyroidism associated with surgical procedure 10/07/2014   Lung mass  Myalgia 11/04/2014   Mycobacterium avium complex ([place])    Nail dystrophy 01/30/2018   Occult malignancy (HCC)    PAF (paroxysmal atrial fibrillation) ([place]) 02/05/2020   Persistent atrial fibrillation ([place]) 03/19/2019   Chads 2 Vascor equals 3   PONV (postoperative nausea and vomiting)    Pre-ulcerative calluses 01/30/2018   Pseudoaneurysm following procedure ([place]) 04/16/2020   Sensorineural hearing loss (SNHL), bilateral 08/11/2018   Shingles    Toenail fungus 01/30/2018   Wears glasses     Current Outpatient Medications  Medication Sig Dispense Refill   amiodarone (PACERONE) 200 MG tablet Take 200 mg by mouth daily.     carvedilol (COREG) 6.25 MG tablet  TAKE 1 TABLET BY MOUTH TWICE A DAY 180 tablet 1   dapagliflozin propanediol (FARXIGA) 10 MG TABS tablet Take 1 tablet (10 mg total) by mouth daily before breakfast. 90 tablet 2   ELIQUIS 5 MG TABS tablet TAKE 1 TABLET BY MOUTH TWICE A DAY 60 tablet 5   sacubitril-valsartan (ENTRESTO) 49-51 MG Take 1 tablet by mouth 2 (two) times daily. 180 tablet 3   SYNTHROID 175 MCG tablet Take 175 mcg by mouth daily.     furosemide (LASIX) 20 MG tablet Take 4 tablets (80 mg total) by mouth daily. 180 tablet 3   No current facility-administered medications for this encounter.    No Known Allergies    Social History   Socioeconomic History   Marital status: Married    Spouse name: Not on file   Number of children: Not on file   Years of education: Not on file   Highest education level: Not on file  Occupational History   Not on file  Tobacco Use   Smoking status: Never   Smokeless tobacco: Never  Substance and Sexual Activity   Alcohol use: No   Drug use: No   Sexual activity: Not on file  Other Topics Concern   Not on file  Social History Narrative   Not on file   Social Determinants of Health   Financial Resource Strain: Not on file  Food Insecurity: Not on file  Transportation Needs: Not on file  Physical Activity: Not on file  Stress: Not on file  Social Connections: Not on file  Intimate Partner Violence: Not on file    Vitals:   12/07/22 1137  BP: 100/60  Pulse: 99  SpO2: 96%  Weight: 71.9 kg (158 lb 9.6 oz)    PHYSICAL EXAM: General:  Thin weak appearing. No respiratory difficulty HEENT: normal Neck: supple. JVP to jaw. Carotids 2+ bilat; no bruits. No lymphadenopathy or thryomegaly appreciated. Cor: PMI nondisplaced. Irregular rate & rhythm. 2/6 MR/TR + s3 Lungs: clear Abdomen: soft, nontender, nondistended. No hepatosplenomegaly. No bruits or masses. Good bowel sounds. Extremities: no cyanosis, clubbing, rash, tr edema Neuro: alert & oriented x 3, cranial nerves  grossly intact. moves all 4 extremities w/o difficulty. Affect pleasant.  ECG: AF 99 QRS 173m No PVCs  Personally reviewed   ASSESSMENT & PLAN:  1. Chronic systolic HF due to NICM  - Echo 5/20 EF 20-25% in AF at time - Echo 11/20 EF 35-30% - Echo 12/21 EF 55-60% - Cath 4/12 No significant CAD - SPEP negative 2016 - Echo 11/23 EF 20-25% LV markedly dilated + dyssynchrony - RV ok  Moderate MR/TR severe biatrial enlargement  - NYHA IIIB-IV with mild-moderate volume overload - Continue Entresto 49/51 bid - Continue Farxiga 10 - Continue carvedilol 6.25 bid - He has advanced HF  in the setting of NICM. Suspect cardiomyopathy due to LBBB but in looking back at his ECG he has had very frequent PVCs and this may be contributing. He has recently been started on amiodarone for possible DC-CV of AF and now  PVCs are suppressed  - Will place Zio patch to quantify PVCs. Check cMRI to exclude infiltrative CM. Refer to EP for CRT-P (asap). Increase lasix to 80 daily - Not candidate for advanced therapies  2. AF, chronic - on amio for possible DC-CV but given duration of AF and massive LA dilation doubt he would hold - Continue Eliquis  3. LBBB - plan as above  4. CKD 3a - check labs  5. Frequent PVCs - on amio for suppression - check zio to quantify burden  Total time spent 45 minutes. Over half that time spent discussing above.   Glori Bickers, MD  4:28 PM

## 2022-12-07 ENCOUNTER — Encounter (HOSPITAL_COMMUNITY): Payer: Self-pay | Admitting: Internal Medicine

## 2022-12-07 ENCOUNTER — Ambulatory Visit: Payer: Medicare Other | Admitting: Cardiology

## 2022-12-07 ENCOUNTER — Inpatient Hospital Stay (HOSPITAL_COMMUNITY)
Admission: RE | Admit: 2022-12-07 | Discharge: 2022-12-07 | Disposition: A | Discharge status: Home / Self Care | Payer: Medicare Other | Source: Ambulatory Visit | Attending: Internal Medicine | Admitting: Internal Medicine

## 2022-12-07 ENCOUNTER — Other Ambulatory Visit (HOSPITAL_COMMUNITY): Payer: Self-pay | Admitting: Internal Medicine

## 2022-12-07 ENCOUNTER — Ambulatory Visit (HOSPITAL_COMMUNITY)
Admission: RE | Admit: 2022-12-07 | Discharge: 2022-12-07 | Disposition: A | Discharge status: Home / Self Care | Payer: Medicare Other | Source: Ambulatory Visit | Attending: Internal Medicine | Admitting: Internal Medicine

## 2022-12-07 VITALS — BP 100/60 | HR 99 | Wt 158.6 lb

## 2022-12-07 DIAGNOSIS — I447 Left bundle-branch block, unspecified: Secondary | ICD-10-CM | POA: Diagnosis not present

## 2022-12-07 DIAGNOSIS — I130 Hypertensive heart and chronic kidney disease with heart failure and stage 1 through stage 4 chronic kidney disease, or unspecified chronic kidney disease: Secondary | ICD-10-CM | POA: Insufficient documentation

## 2022-12-07 DIAGNOSIS — I428 Other cardiomyopathies: Secondary | ICD-10-CM | POA: Insufficient documentation

## 2022-12-07 DIAGNOSIS — Z8249 Family history of ischemic heart disease and other diseases of the circulatory system: Secondary | ICD-10-CM | POA: Insufficient documentation

## 2022-12-07 DIAGNOSIS — I5042 Chronic combined systolic (congestive) and diastolic (congestive) heart failure: Secondary | ICD-10-CM

## 2022-12-07 DIAGNOSIS — N1831 Chronic kidney disease, stage 3a: Secondary | ICD-10-CM | POA: Insufficient documentation

## 2022-12-07 DIAGNOSIS — I482 Chronic atrial fibrillation, unspecified: Secondary | ICD-10-CM | POA: Insufficient documentation

## 2022-12-07 DIAGNOSIS — Z79899 Other long term (current) drug therapy: Secondary | ICD-10-CM | POA: Diagnosis not present

## 2022-12-07 DIAGNOSIS — Z7901 Long term (current) use of anticoagulants: Secondary | ICD-10-CM | POA: Diagnosis not present

## 2022-12-07 DIAGNOSIS — I4821 Permanent atrial fibrillation: Secondary | ICD-10-CM

## 2022-12-07 DIAGNOSIS — I493 Ventricular premature depolarization: Secondary | ICD-10-CM

## 2022-12-07 DIAGNOSIS — I5022 Chronic systolic (congestive) heart failure: Secondary | ICD-10-CM | POA: Insufficient documentation

## 2022-12-07 LAB — COMPREHENSIVE METABOLIC PANEL
ALT: 35 U/L (ref 0–44)
AST: 30 U/L (ref 15–41)
Albumin: 3.5 g/dL (ref 3.5–5.0)
Alkaline Phosphatase: 107 U/L (ref 38–126)
Anion gap: 10 (ref 5–15)
BUN: 29 mg/dL — ABNORMAL HIGH (ref 8–23)
CO2: 25 mmol/L (ref 22–32)
Calcium: 10 mg/dL (ref 8.9–10.3)
Chloride: 102 mmol/L (ref 98–111)
Creatinine, Ser: 1.43 mg/dL — ABNORMAL HIGH (ref 0.61–1.24)
GFR, Estimated: 47 mL/min — ABNORMAL LOW (ref >60)
Glucose, Bld: 109 mg/dL — ABNORMAL HIGH (ref 70–99)
Potassium: 4.1 mmol/L (ref 3.5–5.1)
Sodium: 137 mmol/L (ref 135–145)
Total Bilirubin: 1.6 mg/dL — ABNORMAL HIGH (ref 0.3–1.2)
Total Protein: 6.9 g/dL (ref 6.5–8.1)

## 2022-12-07 LAB — CBC
HCT: 40.3 % (ref 39.0–52.0)
Hemoglobin: 14 g/dL (ref 13.0–17.0)
MCH: 37.2 pg — ABNORMAL HIGH (ref 26.0–34.0)
MCHC: 34.7 g/dL (ref 30.0–36.0)
MCV: 107.2 fL — ABNORMAL HIGH (ref 80.0–100.0)
Platelets: 158 10*3/uL (ref 150–400)
RBC: 3.76 MIL/uL — ABNORMAL LOW (ref 4.22–5.81)
RDW: 13.7 % (ref 11.5–15.5)
WBC: 11.3 10*3/uL — ABNORMAL HIGH (ref 4.0–10.5)
nRBC: 0 % (ref 0.0–0.2)

## 2022-12-07 LAB — BRAIN NATRIURETIC PEPTIDE: B Natriuretic Peptide: >4500 pg/mL — ABNORMAL HIGH (ref 0.0–100.0)

## 2022-12-07 MED ORDER — FUROSEMIDE 20 MG PO TABS: 80.0000 mg | ORAL_TABLET | Freq: Every day | ORAL | 3 refills | Status: DC

## 2022-12-07 NOTE — Patient Instructions (Signed)
INCREASE Lasix to 80 mg daily   Labs done today, your results will be available in MyChart, we will contact you for abnormal readings.  Your provider has recommended that  you wear a Zio Patch for 3 days.  This monitor will record your heart rhythm for our review.  IF you have any symptoms while wearing the monitor please press the button.  If you have any issues with the patch or you notice a red or orange light on it please call the company at [phone].  Once you remove the patch please mail it back to the company as soon as possible so we can get the results.   Your physician has requested that you have a cardiac MRI. Cardiac MRI uses a computer to create images of your heart as its beating, producing both still and moving pictures of your heart and major blood vessels. For further information please visit [email or web address] Please follow the instruction sheet given to you today for more information. ONCE APPROVED BY YOUR INSURANCE COMPANY YOU WILL BE CALLED TO HAVE THE TEST ARRANGED.  Your physician recommends that you schedule a follow-up appointment as scheduled

## 2022-12-09 ENCOUNTER — Telehealth (HOSPITAL_COMMUNITY): Payer: Self-pay

## 2022-12-09 NOTE — Telephone Encounter (Signed)
Left message to call back

## 2022-12-09 NOTE — Telephone Encounter (Signed)
He called about needing a heart MRI and wanted to know if we can set it up in [place] at [place].  Is this something you get? I was not sure who to route to.

## 2022-12-10 ENCOUNTER — Telehealth: Payer: Self-pay | Admitting: Cardiology

## 2022-12-10 NOTE — Telephone Encounter (Signed)
Spoke with son [name]. He stated that the pt saw Dr. Haroldine Laws and he increased his Lasix to '80mg'$  q d. He saw the PA and she gave him a stool softner. This morning he called and stated that the pt said he was having trouble getting a deep breath. Dr. Agustin Cree reviewed the chart and labs and recommended that he give the Lasix time to work and see how he feels. If he is in distress when the son see him recommend that he go to the ED. Son agreed and verbalized understanding.

## 2022-12-10 NOTE — Telephone Encounter (Signed)
Pt c/o Shortness Of Breath: STAT if SOB developed within the last 24 hours or pt is noticeably SOB on the phone  1. Are you currently SOB (can you hear that pt is SOB on the phone)?   Unknown  2. How long have you been experiencing SOB?   Since Monday  3. Are you SOB when sitting or when up moving around?   When moving around  4. Are you currently experiencing any other symptoms?   No   Son stated the patient is having difficulty getting a deep breath.  Son stated patient told him his stomach is distended and feels hard.  Son stated the doctor increased his Lasix but the symptoms have not improved.

## 2022-12-13 ENCOUNTER — Telehealth: Payer: Self-pay | Admitting: Cardiology

## 2022-12-13 NOTE — Telephone Encounter (Signed)
  Pt's son calling, they would like to know if the pt still need to f/u with Dr. Raliegh Ip. They saw Dr. Haroldine Laws last week and has an appt with Dr. Quentin Ore in March for [facility] consultation

## 2022-12-14 NOTE — Telephone Encounter (Signed)
LVM for son Curtis Ramos- Per DPR- to keep appt tomorrow as a follow up

## 2022-12-15 ENCOUNTER — Encounter: Payer: Self-pay | Admitting: Cardiology

## 2022-12-15 ENCOUNTER — Ambulatory Visit: Payer: Medicare Other | Attending: Cardiology | Admitting: Cardiology

## 2022-12-15 VITALS — BP 96/64 | HR 69 | Ht 71.0 in | Wt 155.8 lb

## 2022-12-15 DIAGNOSIS — I5022 Chronic systolic (congestive) heart failure: Secondary | ICD-10-CM

## 2022-12-15 DIAGNOSIS — I420 Dilated cardiomyopathy: Secondary | ICD-10-CM

## 2022-12-15 DIAGNOSIS — I5043 Acute on chronic combined systolic (congestive) and diastolic (congestive) heart failure: Secondary | ICD-10-CM

## 2022-12-15 DIAGNOSIS — I10 Essential (primary) hypertension: Secondary | ICD-10-CM | POA: Diagnosis not present

## 2022-12-15 DIAGNOSIS — I4821 Permanent atrial fibrillation: Secondary | ICD-10-CM | POA: Diagnosis not present

## 2022-12-15 NOTE — Patient Instructions (Signed)
Medication Instructions:  Your physician recommends that you continue on your current medications as directed. Please refer to the Current Medication list given to you today.  *If you need a refill on your cardiac medications before your next appointment, please call your pharmacy*   Lab Work: BMP- today If you have labs (blood work) drawn today and your tests are completely normal, you will receive your results only by: [place] (if you have MyChart) OR A paper copy in the mail If you have any lab test that is abnormal or we need to change your treatment, we will call you to review the results.   Testing/Procedures: None Ordered   Follow-Up: At [facility], you and your health needs are our priority.  As part of our continuing mission to provide you with exceptional heart care, we have created designated Provider Care Teams.  These Care Teams include your primary Cardiologist (physician) and Advanced Practice Providers (APPs -  Physician Assistants and Nurse Practitioners) who all work together to provide you with the care you need, when you need it.  We recommend signing up for the patient portal called "MyChart".  Sign up information is provided on this After Visit Summary.  MyChart is used to connect with patients for Virtual Visits (Telemedicine).  Patients are able to view lab/test results, encounter notes, upcoming appointments, etc.  Non-urgent messages can be sent to your provider as well.   To learn more about what you can do with MyChart, go to [email or web address].    Your next appointment:   1 month(s)  The format for your next appointment:   In Person  Provider:   Jenne Campus, MD    Other Instructions NA

## 2022-12-15 NOTE — Progress Notes (Signed)
Cardiology Office Note:    Date:  12/15/2022   ID:  Curtis Ramos, DOB September 15, 1932, MRN [id number]  PCP:  Leonides Sake, MD  Cardiologist:  Jenne Campus, MD    Referring MD: Leonides Sake, MD   Chief Complaint  Patient presents with   Follow-up  Doing much better  History of Present Illness:    Curtis Ramos is a 89 y.o. male with past medical history significant for cardiomyopathy which appears to be nonischemic, initial presentation 2020 May ejection fraction 20 to 25%, he was also noted on atrial fibrillation at that time put on guideline directed medical therapy improved to normalization.  Then was noted again to have deterioration of ejection fraction cardiac catheterization was done which showed no obstructive disease, also atrial fibrillation, he does have severe both atria was and he told me that he is not interested for cardioversion however now he is on amiodarone with potential intention to convert him to sinus rhythm which I doubt will be successful.  He recently seen our advanced congestive heart failure clinic and appreciate their expertise.  Plans are to do MRI to rule out any infiltrative process, plan is also to pursue BiV pacing.  He is scheduled already appointment for that.  He called [place] few weeks ago complaining of feeling very poorly with weight gain.  He also constipated he was given some extra Lasix.  And did quite well.  Now he said he is feeling like a new man.  Past Medical History:  Diagnosis Date   Acute on chronic combined systolic and diastolic CHF (congestive heart failure) ([place]) 03/19/2019   AKI (acute kidney injury) ([place]) 12/26/2014   Arthralgia 11/04/2014   Arthritis    "proabably"   Atypical mycobacterium infection    Cancer [facility])    family unaware of this hx on 2/70/6237   Chronic systolic heart failure (HCC)    Decreased oral intake 12/26/2014   Decreased oral intake 12/26/2014   Depressed left ventricular ejection fraction 02/05/2020    Dilated cardiomyopathy ([place]) 03/19/2019   Ejection fraction 20 to 25% based on echocardiogram done by pulmonologist month ago.   DJD (degenerative joint disease) 10/07/2014   Dyspnea on exertion 03/19/2019   Essential hypertension 02/05/2020   GERD (gastroesophageal reflux disease)    Hammer toes of both feet 01/30/2018   History of thyroid cancer 12/16/2016   Hypercalcemia 12/26/2014   Hypothyroidism    Hypothyroidism associated with surgical procedure 10/07/2014   Lung mass    Myalgia 11/04/2014   Mycobacterium avium complex ([place])    Nail dystrophy 01/30/2018   Occult malignancy (HCC)    PAF (paroxysmal atrial fibrillation) ([place]) 02/05/2020   Persistent atrial fibrillation ([place]) 03/19/2019   Chads 2 Vascor equals 3   PONV (postoperative nausea and vomiting)    Pre-ulcerative calluses 01/30/2018   Pseudoaneurysm following procedure ([place]) 04/16/2020   Sensorineural hearing loss (SNHL), bilateral 08/11/2018   Shingles    Toenail fungus 01/30/2018   Wears glasses     Past Surgical History:  Procedure Laterality Date   APPENDECTOMY     CATARACT EXTRACTION W/ INTRAOCULAR LENS  IMPLANT, BILATERAL Bilateral    COLONOSCOPY     INGUINAL HERNIA REPAIR Left    LEFT HEART CATH AND CORONARY ANGIOGRAPHY N/A 03/11/2020   Procedure: LEFT HEART CATH AND CORONARY ANGIOGRAPHY;  Surgeon: Jettie Booze, MD;  Location: [place] [facility];  Service: Cardiovascular;  Laterality: N/A;   MASS EXCISION Right 08/27/2014   Procedure:  EXCISION MASS RIGHT PALM/INDEX;  Surgeon: Leanora Cover, MD;  Location: [facility];  Service: Orthopedics;  Laterality: Right;   THYROIDECTOMY  2003    Current Medications: Current Meds  Medication Sig   amiodarone (PACERONE) 200 MG tablet Take 200 mg by mouth daily.   carvedilol (COREG) 6.25 MG tablet TAKE 1 TABLET BY MOUTH TWICE A DAY (Patient taking differently: Take 6.25 mg by mouth 2 (two) times daily with a meal.)   dapagliflozin propanediol (FARXIGA) 10 MG TABS  tablet Take 1 tablet (10 mg total) by mouth daily before breakfast.   DOCUSATE SODIUM PO Take 1 tablet by mouth daily.   ELIQUIS 5 MG TABS tablet TAKE 1 TABLET BY MOUTH TWICE A DAY (Patient taking differently: Take 5 mg by mouth 2 (two) times daily. TAKE 1 TABLET BY MOUTH TWICE A DAY)   furosemide (LASIX) 20 MG tablet Take 4 tablets (80 mg total) by mouth daily.   polyethylene glycol (MIRALAX / GLYCOLAX) 17 g packet Take 17 g by mouth daily.   sacubitril-valsartan (ENTRESTO) 49-51 MG Take 1 tablet by mouth 2 (two) times daily.   SYNTHROID 175 MCG tablet Take 175 mcg by mouth daily.     Allergies:   Patient has no known allergies.   Social History   Socioeconomic History   Marital status: Married    Spouse name: Not on file   Number of children: Not on file   Years of education: Not on file   Highest education level: Not on file  Occupational History   Not on file  Tobacco Use   Smoking status: Never   Smokeless tobacco: Never  Substance and Sexual Activity   Alcohol use: No   Drug use: No   Sexual activity: Not on file  Other Topics Concern   Not on file  Social History Narrative   Not on file   Social Determinants of Health   Financial Resource Strain: Not on file  Food Insecurity: Not on file  Transportation Needs: Not on file  Physical Activity: Not on file  Stress: Not on file  Social Connections: Not on file     Family History: The patient's family history is not on file. ROS:   Please see the history of present illness.    All 14 point review of systems negative except as described per history of present illness  EKGs/Labs/Other Studies Reviewed:      Recent Labs: 06/18/2022: Magnesium 2.5 10/05/2022: NT-Pro BNP 3,434 12/07/2022: ALT 35; B Natriuretic Peptide >4,500.0; BUN 29; Creatinine, Ser 1.43; Hemoglobin 14.0; Platelets 158; Potassium 4.1; Sodium 137  Recent Lipid Panel    Component Value Date/Time   LDLDIRECT 80 11/20/2021 1523    Physical Exam:     VS:  BP 96/64 (BP Location: Left Arm, Patient Position: Sitting)   Pulse 69   Ht 5' 11"  (1.803 m)   Wt 155 lb 12.8 oz (70.7 kg)   SpO2 97%   BMI 21.73 kg/m     Wt Readings from Last 3 Encounters:  12/15/22 155 lb 12.8 oz (70.7 kg)  12/07/22 158 lb 9.6 oz (71.9 kg)  11/03/22 158 lb 3.2 oz (71.8 kg)     GEN:  Well nourished, well developed in no acute distress HEENT: Normal NECK: No JVD; No carotid bruits LYMPHATICS: No lymphadenopathy CARDIAC: Irregularly irregular, no murmurs, no rubs, no gallops RESPIRATORY:  Clear to auscultation without rales, wheezing or rhonchi  ABDOMEN: Soft, non-tender, non-distended MUSCULOSKELETAL:  No edema; No deformity  SKIN: Warm and dry LOWER EXTREMITIES: no swelling NEUROLOGIC:  Alert and oriented x 3 PSYCHIATRIC:  Normal affect   ASSESSMENT:    1. Essential hypertension   2. Acute on chronic combined systolic and diastolic CHF (congestive heart failure) ([place])   3. Chronic systolic heart failure ([place])   4. Dilated cardiomyopathy ([place])   5. Permanent atrial fibrillation (HCC)    PLAN:    In order of problems listed above:  Severe cardiomyopathy.  Plan MRI and BiV pacing. Congestive heart failure compensated today we will check Chem-7 to make sure his kidney function potassium is still acceptable. Permanent atrial fibrillation.  Will continue present management which include anticoagulation.  Question about amiodarone is there but he is scheduled to see EP team hopefully they will help [place] to ask the question is there any role for potential cardioversion he is situation with severely enlarged both atria's.  Previously though he was able to convert to sinus rhythm himself and stay in sinus rhythm for a while. Overall he is looking better today.  Plan as described above   Medication Adjustments/Labs and Tests Ordered: Current medicines are reviewed at length with the patient today.  Concerns regarding medicines are outlined above.  Orders  Placed This Encounter  Procedures   Basic metabolic panel   EKG 34-LPFX   Medication changes: No orders of the defined types were placed in this encounter.   Signed, Park Liter, MD, [facility] 12/15/2022 2:15 PM    [facility]

## 2022-12-16 DIAGNOSIS — I493 Ventricular premature depolarization: Secondary | ICD-10-CM | POA: Diagnosis not present

## 2022-12-16 LAB — BASIC METABOLIC PANEL
BUN/Creatinine Ratio: 17 (ref 10–24)
BUN: 27 mg/dL (ref 8–27)
CO2: 25 mmol/L (ref 20–29)
Calcium: 9.1 mg/dL (ref 8.6–10.2)
Chloride: 98 mmol/L (ref 96–106)
Creatinine, Ser: 1.58 mg/dL — ABNORMAL HIGH (ref 0.76–1.27)
Glucose: 84 mg/dL (ref 70–99)
Potassium: 3.9 mmol/L (ref 3.5–5.2)
Sodium: 138 mmol/L (ref 134–144)
eGFR: 42 mL/min/{1.73_m2} — ABNORMAL LOW (ref >59)

## 2022-12-21 ENCOUNTER — Telehealth (HOSPITAL_COMMUNITY): Payer: Self-pay | Admitting: *Deleted

## 2022-12-21 NOTE — Telephone Encounter (Signed)
Reaching out to patient to offer assistance regarding upcoming cardiac imaging study; pt's son answered phone verbalizes understanding of appt date/time, parking situation and where to check in, verified current allergies; name and call back number provided for further questions should they arise  Gordy Clement RN [facility] [phone] office [phone] cell   Patient's son denies that the patient is claustrophobic or has any metal.

## 2022-12-22 ENCOUNTER — Other Ambulatory Visit (HOSPITAL_COMMUNITY): Payer: Self-pay | Admitting: Internal Medicine

## 2022-12-22 ENCOUNTER — Ambulatory Visit (HOSPITAL_COMMUNITY)
Admission: RE | Admit: 2022-12-22 | Discharge: 2022-12-22 | Disposition: A | Discharge status: Home / Self Care | Payer: Medicare Other | Source: Ambulatory Visit | Attending: Internal Medicine | Admitting: Internal Medicine

## 2022-12-22 DIAGNOSIS — I5022 Chronic systolic (congestive) heart failure: Secondary | ICD-10-CM

## 2022-12-22 MED ORDER — GADOBUTROL 1 MMOL/ML IV SOLN
8.0000 mL | Freq: Once | INTRAVENOUS | Status: AC | PRN
  Administered 2022-12-22: 8 mL via INTRAVENOUS

## 2022-12-27 ENCOUNTER — Other Ambulatory Visit: Payer: Self-pay | Admitting: Cardiology

## 2022-12-27 NOTE — Telephone Encounter (Signed)
Rx refill sent to pharmacy. 

## 2022-12-30 DIAGNOSIS — H353221 Exudative age-related macular degeneration, left eye, with active choroidal neovascularization: Secondary | ICD-10-CM | POA: Diagnosis not present

## 2022-12-31 ENCOUNTER — Ambulatory Visit (HOSPITAL_COMMUNITY)
Admission: RE | Admit: 2022-12-31 | Discharge: 2022-12-31 | Disposition: A | Discharge status: Home / Self Care | Payer: Medicare Other | Source: Ambulatory Visit | Attending: Internal Medicine | Admitting: Internal Medicine

## 2022-12-31 ENCOUNTER — Encounter (HOSPITAL_COMMUNITY): Payer: Self-pay | Admitting: Internal Medicine

## 2022-12-31 VITALS — BP 110/78 | HR 87 | Wt 152.0 lb

## 2022-12-31 DIAGNOSIS — I130 Hypertensive heart and chronic kidney disease with heart failure and stage 1 through stage 4 chronic kidney disease, or unspecified chronic kidney disease: Secondary | ICD-10-CM | POA: Diagnosis not present

## 2022-12-31 DIAGNOSIS — Z7901 Long term (current) use of anticoagulants: Secondary | ICD-10-CM | POA: Insufficient documentation

## 2022-12-31 DIAGNOSIS — I5022 Chronic systolic (congestive) heart failure: Secondary | ICD-10-CM | POA: Diagnosis not present

## 2022-12-31 DIAGNOSIS — I482 Chronic atrial fibrillation, unspecified: Secondary | ICD-10-CM | POA: Diagnosis not present

## 2022-12-31 DIAGNOSIS — Z79899 Other long term (current) drug therapy: Secondary | ICD-10-CM | POA: Diagnosis not present

## 2022-12-31 DIAGNOSIS — I447 Left bundle-branch block, unspecified: Secondary | ICD-10-CM

## 2022-12-31 DIAGNOSIS — N1831 Chronic kidney disease, stage 3a: Secondary | ICD-10-CM | POA: Insufficient documentation

## 2022-12-31 DIAGNOSIS — Z8249 Family history of ischemic heart disease and other diseases of the circulatory system: Secondary | ICD-10-CM | POA: Diagnosis not present

## 2022-12-31 DIAGNOSIS — I493 Ventricular premature depolarization: Secondary | ICD-10-CM

## 2022-12-31 LAB — BASIC METABOLIC PANEL
Anion gap: 6 (ref 5–15)
BUN: 34 mg/dL — ABNORMAL HIGH (ref 8–23)
CO2: 26 mmol/L (ref 22–32)
Calcium: 9.1 mg/dL (ref 8.9–10.3)
Chloride: 104 mmol/L (ref 98–111)
Creatinine, Ser: 1.92 mg/dL — ABNORMAL HIGH (ref 0.61–1.24)
GFR, Estimated: 33 mL/min — ABNORMAL LOW (ref >60)
Glucose, Bld: 92 mg/dL (ref 70–99)
Potassium: 4.4 mmol/L (ref 3.5–5.1)
Sodium: 136 mmol/L (ref 135–145)

## 2022-12-31 LAB — BRAIN NATRIURETIC PEPTIDE: B Natriuretic Peptide: 1669.4 pg/mL — ABNORMAL HIGH (ref 0.0–100.0)

## 2022-12-31 MED ORDER — CARVEDILOL 6.25 MG PO TABS: 6.2500 mg | ORAL_TABLET | Freq: Two times a day (BID) | ORAL | 3 refills | Status: DC

## 2022-12-31 NOTE — Progress Notes (Signed)
ADVANCED HF CLINIC CONSULT NOTE  Referring Physician:Dr. Drue Dun Primary Care: Leonides Sake, MD Primary Cardiologist: Dr. Drue Dun   HPI:  Curtis Ramos is a 89 y.o. male with MAI infection, HTN, LBBB, chronic AF and chronic systolic HF due to NICM referred by Dr. Agustin Cree for further evaluation of his HF.   Echo 5/20 EF 20-25% Noted to have atrial fibrillation at that time, he was put on guideline directed medical therapy his ejection fraction improved to almost normal.    Echo 11/20 EF 35-30%  Echo 12/21 EF 55-60%  Cath 4/12 No significant CAD  Echo 11/23 EF 20-25% LV markedly dialted + dyssynchrony - RV ok  Moderate MR/TR severe biatrial enlargement   Has been following with Dr. Agustin Cree. Symptomatically worse over past few weeks. Amiodarone started in anticipation at trial of DCCV for restoring NSR.   We saw him 3 weeks ago and he was markedly volume overloaded. Lasix doubled from 2m daily to 80 daily. Weight down 6 pounds on our scale. Feels much better. Denies CP, SOB, edema, orthopnea or PND.   cMRI 2/24: LBBB, LV EF 17%. Upper normal right ventricular size with RV EF 33%. Severe left atrial enlargement. No LGE.   Zio 2/24  AFL 100% avg rat 67 PVC 1.8%  SPEP 2April 04, 2016no M-spike  FHx: Father died of HF and had a "heart skip"   Past Medical History:  Diagnosis Date   Acute on chronic combined systolic and diastolic CHF (congestive heart failure) ([place] 03/19/2019   AKI (acute kidney injury) ([facility] 12/26/2014   Arthralgia 11/04/2014   Arthritis    "proabably"   Atypical mycobacterium infection    Cancer ([facility]    family unaware of this hx on [id number]  Chronic systolic heart failure (HCC)    Decreased oral intake 12/26/2014   Decreased oral intake 12/26/2014   Depressed left ventricular ejection fraction 02/05/2020   Dilated cardiomyopathy ([facility] 03/19/2019   Ejection fraction 20 to 25% based on echocardiogram done by pulmonologist month ago.   DJD (degenerative  joint disease) 10/07/2014   Dyspnea on exertion 03/19/2019   Essential hypertension 02/05/2020   GERD (gastroesophageal reflux disease)    Hammer toes of both feet 01/30/2018   History of thyroid cancer 12/16/2016   Hypercalcemia 12/26/2014   Hypothyroidism    Hypothyroidism associated with surgical procedure 10/07/2014   Lung mass    Myalgia 11/04/2014   Mycobacterium avium complex ([facility]    Nail dystrophy 01/30/2018   Occult malignancy (HCC)    PAF (paroxysmal atrial fibrillation) ([facility] 02/05/2020   Persistent atrial fibrillation ([facility] 03/19/2019   Chads 2 Vascor equals 3   PONV (postoperative nausea and vomiting)    Pre-ulcerative calluses 01/30/2018   Pseudo[place]urysm following [place] ([facility] [place] 04/16/2020   Sensorineural hearing loss (SNHL), bilateral 08/11/2018   Shingles    Toenail fungus 01/30/2018   Wears glasses     Current Outpatient Medications  Medication Sig Dispense Refill   amiodarone (PACERONE) 200 MG tablet Take 200 mg by mouth daily.     carvedilol (COREG) 6.25 MG tablet Take 1 tablet (6.25 mg total) by mouth 2 (two) times daily. 180 tablet 3   dapagliflozin propanediol (FARXIGA) 10 MG TABS tablet Take 1 tablet (10 mg total) by mouth daily before breakfast. 90 tablet 2   DOCUSATE SODIUM PO Take 1 tablet by mouth daily.     ELIQUIS 5 MG TABS tablet TAKE 1 TABLET BY MOUTH TWICE A DAY 60 tablet 5  furosemide (LASIX) 20 MG tablet Take 4 tablets (80 mg total) by mouth daily. 180 tablet 3   polyethylene glycol (MIRALAX / GLYCOLAX) 17 g packet Take 17 g by mouth daily.     sacubitril-valsartan (ENTRESTO) 49-51 MG Take 1 tablet by mouth 2 (two) times daily. 180 tablet 3   SYNTHROID 175 MCG tablet Take 175 mcg by mouth daily.     No current facility-administered medications for this encounter.    No Known Allergies    Social History   Socioeconomic History   Marital status: Married    Spouse name: Not on file   Number of children: Not on file   Years of education: Not on file    Highest education level: Not on file  Occupational History   Not on file  Tobacco Use   Smoking status: Never   Smokeless tobacco: Never  Substance and Sexual Activity   Alcohol use: No   Drug use: No   Sexual activity: Not on file  Other Topics Concern   Not on file  Social History Narrative   Not on file   Social Determinants of Health   Financial Resource Strain: Not on file  Food Insecurity: Not on file  Transportation Needs: Not on file  Physical Activity: Not on file  Stress: Not on file  Social Connections: Not on file  Intimate Partner Violence: Not on file    Vitals:   12/31/22 1113  BP: 110/78  Pulse: 87  SpO2: 97%  Weight: 68.9 kg (152 lb)   Wt Readings from Last 3 Encounters:  12/31/22 68.9 kg (152 lb)  12/15/22 70.7 kg (155 lb 12.8 oz)  12/07/22 71.9 kg (158 lb 9.6 oz)    PHYSICAL EXAM: General:  Elderly No resp difficulty HEENT: normal Neck: supple. no JVD. Carotids 2+ bilat; no bruits. No lymphadenopathy or thryomegaly appreciated. Cor: PMI nondisplaced. Irregular rate & rhythm. 2/6 MR Lungs: clear Abdomen: soft, nontender, nondistended. No hepatosplenomegaly. No bruits or masses. Good bowel sounds. Extremities: no cyanosis, clubbing, rash, edema Neuro: alert & orientedx3, cranial nerves grossly intact. moves all 4 extremities w/o difficulty. Affect pleasant   ASSESSMENT & PLAN:  1. Chronic systolic HF due to NICM  - Echo 5/20 EF 20-25% in AF at time - Echo 11/20 EF 35-30% - Echo 12/21 EF 55-60% - Cath 4/12 No significant CAD - SPEP negative 2016 - Echo 11/23 EF 20-25% LV markedly dilated + dyssynchrony - RV ok  Moderate MR/TR severe biatrial enlargement  - cMRI 2/24: LBBB, LV EF 17%. Upper normal right ventricular size with RV EF 33%. Severe left atrial enlargement. No LGE.  - Zio 2/24  AFL 100% avg rat 67 PVC 1.8% - NYHA IIIB-IV with mild-moderate volume overload - Continue Entresto 49/51 bid - Continue Farxiga 10 - Continue  carvedilol 6.25 bid - Will hold off on spiro for now with CKD IV and advanced age - EF not improving with PVC suppression. Suspect LBBB CM. Has appt with EP for CRT eval  - Labs today  2. AF, chronic - on amio for possible DC-CV but given duration of AF and massive LA dilation doubt he would hold - Continue Eliquis - Continue amio for PVC suppression  3. LBBB - plan as above  4. CKD 3a - check labs  5. Frequent PVCs - on amio for suppression - zio 2/24 PVCs suppressed with amio (1.8%)    Glori Bickers, MD  11:42 AM

## 2022-12-31 NOTE — Patient Instructions (Signed)
Good to see you today!   No changes to your medications  Labs done today, your results will be available in MyChart, we will contact you for abnormal readings.  Your physician recommends that you schedule a follow-up appointment in: 2 months( April) Call office in mid March to schedule an appointment.  If you have any questions or concerns before your next appointment please send [place] a message through [place] or call our office at [phone].    TO LEAVE A MESSAGE FOR THE NURSE SELECT OPTION 2, PLEASE LEAVE A MESSAGE INCLUDING: YOUR NAME DATE OF BIRTH CALL BACK NUMBER REASON FOR CALL**this is important as we prioritize the call backs  YOU WILL RECEIVE A CALL BACK THE SAME DAY AS LONG AS YOU CALL BEFORE 4:00 PM  At the [facility], you and your health needs are our priority. As part of our continuing mission to provide you with exceptional heart care, we have created designated Provider Care Teams. These Care Teams include your primary Cardiologist (physician) and Advanced Practice Providers (APPs- Physician Assistants and Nurse Practitioners) who all work together to provide you with the care you need, when you need it.   You may see any of the following providers on your designated Care Team at your next follow up: Dr Glori Bickers Dr Loralie Champagne Dr. Roxana Hires, NP Lyda Jester, [place] [facility] [place], [place] Forestine Na, NP Audry Riles, PharmD   Please be sure to bring in all your medications bottles to every appointment.    Thank you for choosing [facility]

## 2023-01-07 ENCOUNTER — Telehealth: Payer: Self-pay

## 2023-01-07 NOTE — Telephone Encounter (Signed)
-----   Message from Park Liter, MD sent at 12/21/2022  5:38 PM EST ----- Chem-7 looks stable.  Continue present management

## 2023-01-07 NOTE — Telephone Encounter (Signed)
Spoke with Joya Martyr notified of results

## 2023-01-13 ENCOUNTER — Encounter (HOSPITAL_COMMUNITY): Payer: Self-pay | Admitting: General Surgery

## 2023-01-13 ENCOUNTER — Inpatient Hospital Stay (HOSPITAL_COMMUNITY)
Admission: EM | Admit: 2023-01-13 | Discharge: 2023-02-11 | DRG: 871 | Disposition: A | Discharge status: Skilled Nursing Facility | Payer: Medicare Other | Attending: Internal Medicine | Admitting: Internal Medicine

## 2023-01-13 ENCOUNTER — Inpatient Hospital Stay (HOSPITAL_COMMUNITY): Payer: Medicare Other

## 2023-01-13 ENCOUNTER — Telehealth (HOSPITAL_COMMUNITY): Payer: Self-pay

## 2023-01-13 ENCOUNTER — Emergency Department (HOSPITAL_COMMUNITY): Payer: Medicare Other

## 2023-01-13 DIAGNOSIS — E039 Hypothyroidism, unspecified: Secondary | ICD-10-CM | POA: Diagnosis present

## 2023-01-13 DIAGNOSIS — K7200 Acute and subacute hepatic failure without coma: Secondary | ICD-10-CM | POA: Diagnosis not present

## 2023-01-13 DIAGNOSIS — M4802 Spinal stenosis, cervical region: Secondary | ICD-10-CM | POA: Diagnosis not present

## 2023-01-13 DIAGNOSIS — I420 Dilated cardiomyopathy: Secondary | ICD-10-CM | POA: Diagnosis present

## 2023-01-13 DIAGNOSIS — J9602 Acute respiratory failure with hypercapnia: Secondary | ICD-10-CM | POA: Diagnosis not present

## 2023-01-13 DIAGNOSIS — R1312 Dysphagia, oropharyngeal phase: Secondary | ICD-10-CM | POA: Diagnosis not present

## 2023-01-13 DIAGNOSIS — D539 Nutritional anemia, unspecified: Secondary | ICD-10-CM | POA: Diagnosis not present

## 2023-01-13 DIAGNOSIS — I130 Hypertensive heart and chronic kidney disease with heart failure and stage 1 through stage 4 chronic kidney disease, or unspecified chronic kidney disease: Secondary | ICD-10-CM | POA: Diagnosis not present

## 2023-01-13 DIAGNOSIS — J151 Pneumonia due to Pseudomonas: Secondary | ICD-10-CM | POA: Diagnosis not present

## 2023-01-13 DIAGNOSIS — R410 Disorientation, unspecified: Secondary | ICD-10-CM | POA: Diagnosis not present

## 2023-01-13 DIAGNOSIS — T85698A Other mechanical complication of other specified internal prosthetic devices, implants and grafts, initial encounter: Secondary | ICD-10-CM | POA: Diagnosis not present

## 2023-01-13 DIAGNOSIS — G9341 Metabolic encephalopathy: Secondary | ICD-10-CM | POA: Diagnosis not present

## 2023-01-13 DIAGNOSIS — K219 Gastro-esophageal reflux disease without esophagitis: Secondary | ICD-10-CM | POA: Diagnosis present

## 2023-01-13 DIAGNOSIS — R4182 Altered mental status, unspecified: Secondary | ICD-10-CM | POA: Diagnosis not present

## 2023-01-13 DIAGNOSIS — I462 Cardiac arrest due to underlying cardiac condition: Secondary | ICD-10-CM | POA: Diagnosis present

## 2023-01-13 DIAGNOSIS — G931 Anoxic brain damage, not elsewhere classified: Secondary | ICD-10-CM | POA: Diagnosis not present

## 2023-01-13 DIAGNOSIS — R001 Bradycardia, unspecified: Secondary | ICD-10-CM | POA: Diagnosis not present

## 2023-01-13 DIAGNOSIS — M5030 Other cervical disc degeneration, unspecified cervical region: Secondary | ICD-10-CM | POA: Diagnosis not present

## 2023-01-13 DIAGNOSIS — I081 Rheumatic disorders of both mitral and tricuspid valves: Secondary | ICD-10-CM | POA: Diagnosis present

## 2023-01-13 DIAGNOSIS — E162 Hypoglycemia, unspecified: Secondary | ICD-10-CM | POA: Diagnosis not present

## 2023-01-13 DIAGNOSIS — Z681 Body mass index (BMI) 19 or less, adult: Secondary | ICD-10-CM

## 2023-01-13 DIAGNOSIS — E876 Hypokalemia: Secondary | ICD-10-CM | POA: Diagnosis not present

## 2023-01-13 DIAGNOSIS — I5022 Chronic systolic (congestive) heart failure: Secondary | ICD-10-CM | POA: Diagnosis not present

## 2023-01-13 DIAGNOSIS — E43 Unspecified severe protein-calorie malnutrition: Secondary | ICD-10-CM

## 2023-01-13 DIAGNOSIS — R339 Retention of urine, unspecified: Secondary | ICD-10-CM | POA: Diagnosis not present

## 2023-01-13 DIAGNOSIS — R338 Other retention of urine: Secondary | ICD-10-CM | POA: Diagnosis not present

## 2023-01-13 DIAGNOSIS — R6521 Severe sepsis with septic shock: Secondary | ICD-10-CM

## 2023-01-13 DIAGNOSIS — D6959 Other secondary thrombocytopenia: Secondary | ICD-10-CM | POA: Diagnosis not present

## 2023-01-13 DIAGNOSIS — Y990 Civilian activity done for income or pay: Secondary | ICD-10-CM | POA: Diagnosis not present

## 2023-01-13 DIAGNOSIS — R570 Cardiogenic shock: Secondary | ICD-10-CM | POA: Diagnosis not present

## 2023-01-13 DIAGNOSIS — J90 Pleural effusion, not elsewhere classified: Secondary | ICD-10-CM | POA: Diagnosis not present

## 2023-01-13 DIAGNOSIS — I2489 Other forms of acute ischemic heart disease: Secondary | ICD-10-CM | POA: Diagnosis present

## 2023-01-13 DIAGNOSIS — I5020 Unspecified systolic (congestive) heart failure: Secondary | ICD-10-CM | POA: Diagnosis not present

## 2023-01-13 DIAGNOSIS — J95851 Ventilator associated pneumonia: Secondary | ICD-10-CM | POA: Diagnosis not present

## 2023-01-13 DIAGNOSIS — J690 Pneumonitis due to inhalation of food and vomit: Secondary | ICD-10-CM | POA: Diagnosis present

## 2023-01-13 DIAGNOSIS — R54 Age-related physical debility: Secondary | ICD-10-CM | POA: Diagnosis present

## 2023-01-13 DIAGNOSIS — M47812 Spondylosis without myelopathy or radiculopathy, cervical region: Secondary | ICD-10-CM | POA: Diagnosis not present

## 2023-01-13 DIAGNOSIS — N170 Acute kidney failure with tubular necrosis: Secondary | ICD-10-CM | POA: Diagnosis not present

## 2023-01-13 DIAGNOSIS — S0003XA Contusion of scalp, initial encounter: Secondary | ICD-10-CM | POA: Diagnosis not present

## 2023-01-13 DIAGNOSIS — Z743 Need for continuous supervision: Secondary | ICD-10-CM | POA: Diagnosis not present

## 2023-01-13 DIAGNOSIS — A419 Sepsis, unspecified organism: Principal | ICD-10-CM

## 2023-01-13 DIAGNOSIS — J189 Pneumonia, unspecified organism: Secondary | ICD-10-CM | POA: Diagnosis present

## 2023-01-13 DIAGNOSIS — I469 Cardiac arrest, cause unspecified: Secondary | ICD-10-CM | POA: Diagnosis not present

## 2023-01-13 DIAGNOSIS — S40021A Contusion of right upper arm, initial encounter: Secondary | ICD-10-CM | POA: Diagnosis present

## 2023-01-13 DIAGNOSIS — Z79890 Hormone replacement therapy (postmenopausal): Secondary | ICD-10-CM

## 2023-01-13 DIAGNOSIS — N1832 Chronic kidney disease, stage 3b: Secondary | ICD-10-CM | POA: Diagnosis not present

## 2023-01-13 DIAGNOSIS — I499 Cardiac arrhythmia, unspecified: Secondary | ICD-10-CM | POA: Diagnosis not present

## 2023-01-13 DIAGNOSIS — S2222XA Fracture of body of sternum, initial encounter for closed fracture: Secondary | ICD-10-CM | POA: Diagnosis not present

## 2023-01-13 DIAGNOSIS — M96A4 Flail chest associated with chest compression and cardiopulmonary resuscitation: Secondary | ICD-10-CM | POA: Diagnosis present

## 2023-01-13 DIAGNOSIS — M96A3 Multiple fractures of ribs associated with chest compression and cardiopulmonary resuscitation: Secondary | ICD-10-CM | POA: Diagnosis not present

## 2023-01-13 DIAGNOSIS — Z4682 Encounter for fitting and adjustment of non-vascular catheter: Secondary | ICD-10-CM | POA: Diagnosis not present

## 2023-01-13 DIAGNOSIS — S2243XA Multiple fractures of ribs, bilateral, initial encounter for closed fracture: Secondary | ICD-10-CM | POA: Diagnosis not present

## 2023-01-13 DIAGNOSIS — I4819 Other persistent atrial fibrillation: Secondary | ICD-10-CM | POA: Diagnosis not present

## 2023-01-13 DIAGNOSIS — Z7984 Long term (current) use of oral hypoglycemic drugs: Secondary | ICD-10-CM

## 2023-01-13 DIAGNOSIS — E8729 Other acidosis: Secondary | ICD-10-CM | POA: Diagnosis present

## 2023-01-13 DIAGNOSIS — N179 Acute kidney failure, unspecified: Secondary | ICD-10-CM | POA: Diagnosis present

## 2023-01-13 DIAGNOSIS — I447 Left bundle-branch block, unspecified: Secondary | ICD-10-CM | POA: Diagnosis present

## 2023-01-13 DIAGNOSIS — R6889 Other general symptoms and signs: Secondary | ICD-10-CM | POA: Diagnosis not present

## 2023-01-13 DIAGNOSIS — R748 Abnormal levels of other serum enzymes: Secondary | ICD-10-CM | POA: Diagnosis not present

## 2023-01-13 DIAGNOSIS — I495 Sick sinus syndrome: Secondary | ICD-10-CM | POA: Diagnosis present

## 2023-01-13 DIAGNOSIS — E870 Hyperosmolality and hypernatremia: Secondary | ICD-10-CM | POA: Diagnosis not present

## 2023-01-13 DIAGNOSIS — Z7189 Other specified counseling: Secondary | ICD-10-CM | POA: Diagnosis not present

## 2023-01-13 DIAGNOSIS — K921 Melena: Secondary | ICD-10-CM

## 2023-01-13 DIAGNOSIS — E890 Postprocedural hypothyroidism: Secondary | ICD-10-CM | POA: Diagnosis not present

## 2023-01-13 DIAGNOSIS — H903 Sensorineural hearing loss, bilateral: Secondary | ICD-10-CM | POA: Diagnosis present

## 2023-01-13 DIAGNOSIS — J9601 Acute respiratory failure with hypoxia: Secondary | ICD-10-CM | POA: Diagnosis not present

## 2023-01-13 DIAGNOSIS — T85628A Displacement of other specified internal prosthetic devices, implants and grafts, initial encounter: Secondary | ICD-10-CM | POA: Diagnosis not present

## 2023-01-13 DIAGNOSIS — Z79899 Other long term (current) drug therapy: Secondary | ICD-10-CM

## 2023-01-13 DIAGNOSIS — S2249XS Multiple fractures of ribs, unspecified side, sequela: Secondary | ICD-10-CM | POA: Diagnosis not present

## 2023-01-13 DIAGNOSIS — Z515 Encounter for palliative care: Secondary | ICD-10-CM | POA: Diagnosis not present

## 2023-01-13 DIAGNOSIS — M1990 Unspecified osteoarthritis, unspecified site: Secondary | ICD-10-CM | POA: Diagnosis present

## 2023-01-13 DIAGNOSIS — R079 Chest pain, unspecified: Secondary | ICD-10-CM | POA: Diagnosis not present

## 2023-01-13 DIAGNOSIS — K8020 Calculus of gallbladder without cholecystitis without obstruction: Secondary | ICD-10-CM | POA: Diagnosis not present

## 2023-01-13 DIAGNOSIS — Z0389 Encounter for observation for other suspected diseases and conditions ruled out: Secondary | ICD-10-CM | POA: Diagnosis not present

## 2023-01-13 DIAGNOSIS — B965 Pseudomonas (aeruginosa) (mallei) (pseudomallei) as the cause of diseases classified elsewhere: Secondary | ICD-10-CM | POA: Diagnosis not present

## 2023-01-13 DIAGNOSIS — E871 Hypo-osmolality and hyponatremia: Secondary | ICD-10-CM | POA: Diagnosis present

## 2023-01-13 DIAGNOSIS — W1830XA Fall on same level, unspecified, initial encounter: Secondary | ICD-10-CM | POA: Diagnosis present

## 2023-01-13 DIAGNOSIS — Z7901 Long term (current) use of anticoagulants: Secondary | ICD-10-CM

## 2023-01-13 DIAGNOSIS — Z85850 Personal history of malignant neoplasm of thyroid: Secondary | ICD-10-CM

## 2023-01-13 DIAGNOSIS — R404 Transient alteration of awareness: Secondary | ICD-10-CM | POA: Diagnosis not present

## 2023-01-13 DIAGNOSIS — R58 Hemorrhage, not elsewhere classified: Secondary | ICD-10-CM | POA: Diagnosis not present

## 2023-01-13 HISTORY — DX: Cardiac arrest, cause unspecified: I46.9

## 2023-01-13 HISTORY — DX: Acute respiratory failure with hypoxia: J96.01

## 2023-01-13 HISTORY — DX: Sepsis, unspecified organism: A41.9

## 2023-01-13 HISTORY — DX: Sepsis, unspecified organism: R65.21

## 2023-01-13 LAB — GLUCOSE, CAPILLARY
Glucose-Capillary: 223 mg/dL — ABNORMAL HIGH (ref 70–99)
Glucose-Capillary: 128 mg/dL — ABNORMAL HIGH (ref 70–99)
Glucose-Capillary: 114 mg/dL — ABNORMAL HIGH (ref 70–99)

## 2023-01-13 LAB — URINALYSIS, ROUTINE W REFLEX MICROSCOPIC
Bilirubin Urine: NEGATIVE
Glucose, UA: >=500 mg/dL — AB
Ketones, ur: NEGATIVE mg/dL
Leukocytes,Ua: NEGATIVE
Nitrite: NEGATIVE
Protein, ur: 30 mg/dL — AB
Specific Gravity, Urine: 1.02 (ref 1.005–1.030)
pH: 6 (ref 5.0–8.0)

## 2023-01-13 LAB — I-STAT ARTERIAL BLOOD GAS, ED
Acid-base deficit: 8 mmol/L — ABNORMAL HIGH (ref 0.0–2.0)
Bicarbonate: 21.2 mmol/L (ref 20.0–28.0)
Calcium, Ion: 1.23 mmol/L (ref 1.15–1.40)
HCT: 46 % (ref 39.0–52.0)
Hemoglobin: 15.6 g/dL (ref 13.0–17.0)
O2 Saturation: 88 %
Potassium: 4.2 mmol/L (ref 3.5–5.1)
Sodium: 138 mmol/L (ref 135–145)
TCO2: 23 mmol/L (ref 22–32)
pCO2 arterial: 55 mmHg — ABNORMAL HIGH (ref 32–48)
pH, Arterial: 7.194 — CL (ref 7.35–7.45)
pO2, Arterial: 68 mmHg — ABNORMAL LOW (ref 83–108)

## 2023-01-13 LAB — I-STAT CHEM 8, ED
BUN: 41 mg/dL — ABNORMAL HIGH (ref 8–23)
Calcium, Ion: 1.11 mmol/L — ABNORMAL LOW (ref 1.15–1.40)
Chloride: 107 mmol/L (ref 98–111)
Creatinine, Ser: 2 mg/dL — ABNORMAL HIGH (ref 0.61–1.24)
Glucose, Bld: 158 mg/dL — ABNORMAL HIGH (ref 70–99)
HCT: 45 % (ref 39.0–52.0)
Hemoglobin: 15.3 g/dL (ref 13.0–17.0)
Potassium: 4.6 mmol/L (ref 3.5–5.1)
Sodium: 138 mmol/L (ref 135–145)
TCO2: 24 mmol/L (ref 22–32)

## 2023-01-13 LAB — POCT I-STAT 7, (LYTES, BLD GAS, ICA,H+H)
Acid-base deficit: 8 mmol/L — ABNORMAL HIGH (ref 0.0–2.0)
Bicarbonate: 19.5 mmol/L — ABNORMAL LOW (ref 20.0–28.0)
Calcium, Ion: 1.2 mmol/L (ref 1.15–1.40)
HCT: 43 % (ref 39.0–52.0)
Hemoglobin: 14.6 g/dL (ref 13.0–17.0)
O2 Saturation: 99 %
Potassium: 3.5 mmol/L (ref 3.5–5.1)
Sodium: 132 mmol/L — ABNORMAL LOW (ref 135–145)
TCO2: 21 mmol/L — ABNORMAL LOW (ref 22–32)
pCO2 arterial: 48.1 mmHg — ABNORMAL HIGH (ref 32–48)
pH, Arterial: 7.215 — ABNORMAL LOW (ref 7.35–7.45)
pO2, Arterial: 148 mmHg — ABNORMAL HIGH (ref 83–108)

## 2023-01-13 LAB — COMPREHENSIVE METABOLIC PANEL
ALT: 48 U/L — ABNORMAL HIGH (ref 0–44)
AST: 85 U/L — ABNORMAL HIGH (ref 15–41)
Albumin: 3.1 g/dL — ABNORMAL LOW (ref 3.5–5.0)
Alkaline Phosphatase: 97 U/L (ref 38–126)
Anion gap: 14 (ref 5–15)
BUN: 31 mg/dL — ABNORMAL HIGH (ref 8–23)
CO2: 18 mmol/L — ABNORMAL LOW (ref 22–32)
Calcium: 8.3 mg/dL — ABNORMAL LOW (ref 8.9–10.3)
Chloride: 106 mmol/L (ref 98–111)
Creatinine, Ser: 1.79 mg/dL — ABNORMAL HIGH (ref 0.61–1.24)
GFR, Estimated: 36 mL/min — ABNORMAL LOW (ref >60)
Glucose, Bld: 187 mg/dL — ABNORMAL HIGH (ref 70–99)
Potassium: 4.1 mmol/L (ref 3.5–5.1)
Sodium: 138 mmol/L (ref 135–145)
Total Bilirubin: 0.7 mg/dL (ref 0.3–1.2)
Total Protein: 6 g/dL — ABNORMAL LOW (ref 6.5–8.1)

## 2023-01-13 LAB — CBC
HCT: 42.6 % (ref 39.0–52.0)
Hemoglobin: 14.1 g/dL (ref 13.0–17.0)
MCH: 37.8 pg — ABNORMAL HIGH (ref 26.0–34.0)
MCHC: 33.1 g/dL (ref 30.0–36.0)
MCV: 114.2 fL — ABNORMAL HIGH (ref 80.0–100.0)
Platelets: 169 10*3/uL (ref 150–400)
RBC: 3.73 MIL/uL — ABNORMAL LOW (ref 4.22–5.81)
RDW: 14.3 % (ref 11.5–15.5)
WBC: 13.9 10*3/uL — ABNORMAL HIGH (ref 4.0–10.5)
nRBC: 0.2 % (ref 0.0–0.2)

## 2023-01-13 LAB — COOXEMETRY PANEL
Carboxyhemoglobin: 1.5 % (ref 0.5–1.5)
Methemoglobin: <0.7 % (ref 0.0–1.5)
O2 Saturation: 79.1 %
Total hemoglobin: 14.6 g/dL (ref 12.0–16.0)

## 2023-01-13 LAB — T4, FREE: Free T4: 1.26 ng/dL — ABNORMAL HIGH (ref 0.61–1.12)

## 2023-01-13 LAB — TSH: TSH: 29.48 u[IU]/mL — ABNORMAL HIGH (ref 0.350–4.500)

## 2023-01-13 LAB — TROPONIN I (HIGH SENSITIVITY)
Troponin I (High Sensitivity): 27 ng/L — ABNORMAL HIGH (ref <18)
Troponin I (High Sensitivity): 155 ng/L (ref <18)

## 2023-01-13 LAB — LACTIC ACID, PLASMA
Lactic Acid, Venous: 7 mmol/L (ref 0.5–1.9)
Lactic Acid, Venous: 4.6 mmol/L (ref 0.5–1.9)
Lactic Acid, Venous: 2.9 mmol/L (ref 0.5–1.9)

## 2023-01-13 LAB — MRSA NEXT GEN BY PCR, NASAL: MRSA by PCR Next Gen: NOT DETECTED

## 2023-01-13 LAB — URINALYSIS, MICROSCOPIC (REFLEX)

## 2023-01-13 LAB — PROTIME-INR
INR: 2.2 — ABNORMAL HIGH (ref 0.8–1.2)
Prothrombin Time: 24.4 seconds — ABNORMAL HIGH (ref 11.4–15.2)

## 2023-01-13 LAB — BRAIN NATRIURETIC PEPTIDE: B Natriuretic Peptide: 1073.1 pg/mL — ABNORMAL HIGH (ref 0.0–100.0)

## 2023-01-13 LAB — CBG MONITORING, ED: Glucose-Capillary: 86 mg/dL (ref 70–99)

## 2023-01-13 LAB — SAMPLE TO BLOOD BANK

## 2023-01-13 LAB — CK: Total CK: 205 U/L (ref 49–397)

## 2023-01-13 LAB — ETHANOL: Alcohol, Ethyl (B): <10 mg/dL (ref <10)

## 2023-01-13 LAB — PROCALCITONIN: Procalcitonin: <0.1 ng/mL

## 2023-01-13 MED ORDER — POLYETHYLENE GLYCOL 3350 17 G PO PACK
17.0000 g | PACK | Freq: Every day | ORAL | Status: DC
  Administered 2023-01-14 – 2023-01-21 (×7): 17 g
  Filled 2023-01-13 (×8): qty 1

## 2023-01-13 MED ORDER — FENTANYL CITRATE PF 50 MCG/ML IJ SOSY: 25.0000 ug | PREFILLED_SYRINGE | INTRAMUSCULAR | Status: DC | PRN

## 2023-01-13 MED ORDER — SODIUM CHLORIDE 0.9 % IV SOLN
3.0000 g | Freq: Two times a day (BID) | INTRAVENOUS | Status: DC
  Administered 2023-01-13 – 2023-01-15 (×4): 3 g via INTRAVENOUS
  Filled 2023-01-13 (×4): qty 8

## 2023-01-13 MED ORDER — NOREPINEPHRINE 4 MG/250ML-% IV SOLN
2.0000 ug/min | INTRAVENOUS | Status: DC
  Filled 2023-01-13: qty 250

## 2023-01-13 MED ORDER — FENTANYL CITRATE PF 50 MCG/ML IJ SOSY
PREFILLED_SYRINGE | INTRAMUSCULAR | Status: AC
  Filled 2023-01-13: qty 2

## 2023-01-13 MED ORDER — EPINEPHRINE 1 MG/10ML IJ SOSY
PREFILLED_SYRINGE | INTRAMUSCULAR | Status: AC | PRN
  Administered 2023-01-13 (×2): 1 mg via INTRAVENOUS

## 2023-01-13 MED ORDER — ROCURONIUM BROMIDE 10 MG/ML (PF) SYRINGE
PREFILLED_SYRINGE | INTRAVENOUS | Status: AC
  Filled 2023-01-13: qty 10

## 2023-01-13 MED ORDER — DOCUSATE SODIUM 100 MG PO CAPS: 100.0000 mg | ORAL_CAPSULE | Freq: Two times a day (BID) | ORAL | Status: DC | PRN

## 2023-01-13 MED ORDER — FUROSEMIDE 10 MG/ML IJ SOLN: 60.0000 mg | Freq: Once | INTRAMUSCULAR | Status: DC

## 2023-01-13 MED ORDER — FUROSEMIDE 10 MG/ML IJ SOLN
60.0000 mg | Freq: Once | INTRAMUSCULAR | Status: AC
  Administered 2023-01-13: 60 mg via INTRAVENOUS
  Filled 2023-01-13: qty 6

## 2023-01-13 MED ORDER — FENTANYL BOLUS VIA INFUSION
25.0000 ug | INTRAVENOUS | Status: DC | PRN
  Administered 2023-01-13: 100 ug via INTRAVENOUS
  Administered 2023-01-14 (×2): 25 ug via INTRAVENOUS

## 2023-01-13 MED ORDER — PROPOFOL 1000 MG/100ML IV EMUL
INTRAVENOUS | Status: AC
  Administered 2023-01-13: 20 ug/kg/min via INTRAVENOUS
  Filled 2023-01-13: qty 100

## 2023-01-13 MED ORDER — FENTANYL CITRATE (PF) 100 MCG/2ML IJ SOLN
INTRAMUSCULAR | Status: AC | PRN
  Administered 2023-01-13: 100 ug via INTRAVENOUS

## 2023-01-13 MED ORDER — ORAL CARE MOUTH RINSE
15.0000 mL | OROMUCOSAL | Status: DC
  Administered 2023-01-13 – 2023-01-20 (×83): 15 mL via OROMUCOSAL

## 2023-01-13 MED ORDER — EPINEPHRINE HCL 5 MG/250ML IV SOLN IN NS
0.5000 ug/min | INTRAVENOUS | Status: DC
  Administered 2023-01-13 (×2): 20 ug/min via INTRAVENOUS
  Administered 2023-01-13: 10 ug/min via INTRAVENOUS
  Administered 2023-01-13 – 2023-01-14 (×4): 20 ug/min via INTRAVENOUS
  Filled 2023-01-13 (×6): qty 250

## 2023-01-13 MED ORDER — FENTANYL CITRATE (PF) 100 MCG/2ML IJ SOLN
INTRAMUSCULAR | Status: AC
  Filled 2023-01-13: qty 2

## 2023-01-13 MED ORDER — FENTANYL 2500MCG IN NS 250ML (10MCG/ML) PREMIX INFUSION
25.0000 ug/h | INTRAVENOUS | Status: DC
  Administered 2023-01-13: 25 ug/h via INTRAVENOUS
  Administered 2023-01-13: 50 ug/h via INTRAVENOUS
  Administered 2023-01-14: 200 ug/h via INTRAVENOUS
  Administered 2023-01-14: 75 ug/h via INTRAVENOUS
  Administered 2023-01-15 (×2): 175 ug/h via INTRAVENOUS
  Filled 2023-01-13 (×5): qty 250

## 2023-01-13 MED ORDER — HEPARIN SODIUM (PORCINE) 5000 UNIT/ML IJ SOLN
5000.0000 [IU] | Freq: Three times a day (TID) | INTRAMUSCULAR | Status: DC
  Administered 2023-01-14 – 2023-01-18 (×13): 5000 [IU] via SUBCUTANEOUS
  Filled 2023-01-13 (×13): qty 1

## 2023-01-13 MED ORDER — MANNITOL 20 % IV SOLN
100.0000 g | Freq: Once | Status: AC
  Administered 2023-01-13: 100 g via INTRAVENOUS
  Filled 2023-01-13: qty 500

## 2023-01-13 MED ORDER — ATROPINE SULFATE 1 MG/ML IV SOLN
1.0000 mg | Freq: Once | INTRAVENOUS | Status: AC
  Administered 2023-01-13: 1 mg via INTRAVENOUS
  Filled 2023-01-13: qty 1

## 2023-01-13 MED ORDER — NOREPINEPHRINE 4 MG/250ML-% IV SOLN
0.0000 ug/min | INTRAVENOUS | Status: DC
  Administered 2023-01-13: 10 ug/min via INTRAVENOUS

## 2023-01-13 MED ORDER — MANNITOL 20 % IV SOLN: 100.0000 g | INTRAVENOUS | Status: DC

## 2023-01-13 MED ORDER — FENTANYL BOLUS VIA INFUSION: 25.0000 ug | INTRAVENOUS | Status: DC | PRN

## 2023-01-13 MED ORDER — PROPOFOL 1000 MG/100ML IV EMUL
0.0000 ug/kg/min | INTRAVENOUS | Status: DC
  Administered 2023-01-13 (×2): 20 ug/kg/min via INTRAVENOUS
  Administered 2023-01-14: 5 ug/kg/min via INTRAVENOUS
  Administered 2023-01-14: 10 ug/kg/min via INTRAVENOUS
  Administered 2023-01-14 – 2023-01-15 (×2): 5 ug/kg/min via INTRAVENOUS
  Administered 2023-01-15: 15 ug/kg/min via INTRAVENOUS
  Filled 2023-01-13 (×4): qty 100

## 2023-01-13 MED ORDER — ORAL CARE MOUTH RINSE: 15.0000 mL | OROMUCOSAL | Status: DC | PRN

## 2023-01-13 MED ORDER — NOREPINEPHRINE 4 MG/250ML-% IV SOLN
0.0000 ug/min | INTRAVENOUS | Status: DC
  Filled 2023-01-13: qty 250

## 2023-01-13 MED ORDER — INSULIN ASPART 100 UNIT/ML IJ SOLN
0.0000 [IU] | INTRAMUSCULAR | Status: DC
  Administered 2023-01-13: 3 [IU] via SUBCUTANEOUS
  Administered 2023-01-13 – 2023-01-18 (×7): 1 [IU] via SUBCUTANEOUS
  Administered 2023-01-18 (×2): 2 [IU] via SUBCUTANEOUS
  Administered 2023-01-19 – 2023-01-21 (×7): 1 [IU] via SUBCUTANEOUS

## 2023-01-13 MED ORDER — SODIUM CHLORIDE 0.9 % IV SOLN
250.0000 mL | INTRAVENOUS | Status: DC
  Administered 2023-01-13 – 2023-01-24 (×5): 250 mL via INTRAVENOUS

## 2023-01-13 MED ORDER — SODIUM CHLORIDE 0.9 % IV BOLUS
1000.0000 mL | Freq: Once | INTRAVENOUS | Status: AC
  Administered 2023-01-13: 1000 mL via INTRAVENOUS

## 2023-01-13 MED ORDER — LEVOTHYROXINE SODIUM 75 MCG PO TABS
175.0000 ug | ORAL_TABLET | Freq: Every day | ORAL | Status: DC
  Administered 2023-01-14 – 2023-01-31 (×18): 175 ug
  Filled 2023-01-13 (×3): qty 3
  Filled 2023-01-13 (×3): qty 1
  Filled 2023-01-13 (×3): qty 3
  Filled 2023-01-13 (×2): qty 1
  Filled 2023-01-13 (×3): qty 3
  Filled 2023-01-13: qty 1
  Filled 2023-01-13: qty 3
  Filled 2023-01-13: qty 1
  Filled 2023-01-13 (×2): qty 3

## 2023-01-13 MED ORDER — PANTOPRAZOLE SODIUM 40 MG IV SOLR
40.0000 mg | Freq: Every day | INTRAVENOUS | Status: DC
  Administered 2023-01-13 – 2023-01-19 (×7): 40 mg via INTRAVENOUS
  Filled 2023-01-13 (×7): qty 10

## 2023-01-13 MED ORDER — CHLORHEXIDINE GLUCONATE CLOTH 2 % EX PADS
6.0000 | MEDICATED_PAD | Freq: Every day | CUTANEOUS | Status: DC
  Administered 2023-01-13 – 2023-01-25 (×14): 6 via TOPICAL

## 2023-01-13 MED ORDER — ROCURONIUM BROMIDE 10 MG/ML (PF) SYRINGE
100.0000 mg | PREFILLED_SYRINGE | Freq: Once | INTRAVENOUS | Status: AC
  Administered 2023-01-13: 100 mg via INTRAVENOUS

## 2023-01-13 MED ORDER — FENTANYL CITRATE PF 50 MCG/ML IJ SOSY: 25.0000 ug | PREFILLED_SYRINGE | Freq: Once | INTRAMUSCULAR | Status: DC

## 2023-01-13 MED ORDER — POLYETHYLENE GLYCOL 3350 17 G PO PACK: 17.0000 g | PACK | Freq: Every day | ORAL | Status: DC | PRN

## 2023-01-13 MED ORDER — DOCUSATE SODIUM 50 MG/5ML PO LIQD
100.0000 mg | Freq: Two times a day (BID) | ORAL | Status: DC
  Administered 2023-01-13 – 2023-01-21 (×12): 100 mg
  Filled 2023-01-13 (×13): qty 10

## 2023-01-13 MED ORDER — NOREPINEPHRINE 16 MG/250ML-% IV SOLN
0.0000 ug/min | INTRAVENOUS | Status: DC
  Administered 2023-01-13: 8 ug/min via INTRAVENOUS
  Administered 2023-01-14: 13 ug/min via INTRAVENOUS
  Administered 2023-01-15: 9 ug/min via INTRAVENOUS
  Administered 2023-01-17: 2 ug/min via INTRAVENOUS
  Filled 2023-01-13 (×4): qty 250

## 2023-01-13 NOTE — Telephone Encounter (Signed)
Patients son wanted to make you aware that his father fell and hit his head earlier. He is on the way here to [facility]. FYI.

## 2023-01-13 NOTE — Consult Note (Signed)
Curtis Ramos 1932/06/18  [id number].    Requesting MD: Dr. Leanord Asal Chief Complaint/Reason for Consult: level 1 trauma, unresponsive, unwitnessed possible fall  HPI:  This is an 89 yo male with a history of acute on chronic systolic and diastolic HF with an EF of 17% followed by Dr. Haroldine Laws, a fib, HTN, dilated cardiomyopathy, hypothyroidism, who was at work today and was found down beside a radiator with a potential unwitnessed fall.  He was initially tachycardic en route but then became bradycardic. His initial EKG with ST elevation and LBBB.  This LBBB is chronic.  He lost pulses and CPR was initiated upon arrival to ED.  He received 4 rounds of epi and 9 minutes of CPR with return of ROSC.  He was still bradycardic and received atropine after this as well.  He was also placed on an epi gtt.  He was intubated upon arrival.    ROS: Review of Systems  Unable to perform ROS: Acuity of condition   History reviewed. No pertinent family history.  Past Medical History:  Diagnosis Date   Acute on chronic combined systolic and diastolic CHF (congestive heart failure) ([place]) 03/19/2019   AKI (acute kidney injury) ([place]) 12/26/2014   Arthralgia 11/04/2014   Arthritis    "proabably"   Atypical mycobacterium infection    Cancer [facility])    family unaware of this hx on [id number]   Chronic systolic heart failure (HCC)    Decreased oral intake 12/26/2014   Decreased oral intake 12/26/2014   Depressed left ventricular ejection fraction 02/05/2020   Dilated cardiomyopathy ([place]) 03/19/2019   Ejection fraction 20 to 25% based on echocardiogram done by pulmonologist month ago.   DJD (degenerative joint disease) 10/07/2014   Dyspnea on exertion 03/19/2019   Essential hypertension 02/05/2020   GERD (gastroesophageal reflux disease)    Hammer toes of both feet 01/30/2018   History of thyroid cancer 12/16/2016   Hypercalcemia 12/26/2014   Hypothyroidism    Hypothyroidism associated with surgical  procedure 10/07/2014   Lung mass    Myalgia 11/04/2014   Mycobacterium avium complex ([place])    Nail dystrophy 01/30/2018   Occult malignancy (HCC)    PAF (paroxysmal atrial fibrillation) ([place]) 02/05/2020   Persistent atrial fibrillation ([place]) 03/19/2019   Chads 2 Vascor equals 3   PONV (postoperative nausea and vomiting)    Pre-ulcerative calluses 01/30/2018   Pseudoaneurysm following procedure ([place]) 04/16/2020   Sensorineural hearing loss (SNHL), bilateral 08/11/2018   Shingles    Toenail fungus 01/30/2018   Wears glasses     Past Surgical History:  Procedure Laterality Date   APPENDECTOMY     CATARACT EXTRACTION W/ INTRAOCULAR LENS  IMPLANT, BILATERAL Bilateral    COLONOSCOPY     INGUINAL HERNIA REPAIR Left    LEFT HEART CATH AND CORONARY ANGIOGRAPHY N/A 03/11/2020   Procedure: LEFT HEART CATH AND CORONARY ANGIOGRAPHY;  Surgeon: Jettie Booze, MD;  Location: [place] [facility];  Service: Cardiovascular;  Laterality: N/A;   MASS EXCISION Right 08/27/2014   Procedure: EXCISION MASS RIGHT PALM/INDEX;  Surgeon: Leanora Cover, MD;  Location: [facility];  Service: Orthopedics;  Laterality: Right;   THYROIDECTOMY  2003    Social History:  reports that he has never smoked. He has never used smokeless tobacco. He reports that he does not drink alcohol and does not use drugs.  Allergies: No Known Allergies  (Not in a hospital admission)    Physical Exam: Blood pressure Marland Kitchen)  164/77, pulse (!) 35, resp. rate 14, SpO2 (!) 80 %. General: unresponsive, elderly male with CPR in progress. HEENT: head is normocephalic.  Sclera are noninjected.  Right pupil is nonreactive and dilated at 37m.  Left pupil is reactive.  Ears and nose without any masses or lesions.  Mouth is pink and moist with multiple missing teeth. Heart: brady in 30s after ROSC.  Palpable radial and pedal pulses bilaterally Lungs: CTAB, no wheezes, rhonchi, or rales noted.  On vent with ETT in place. Abd: soft,  ND, +BS, no masses, hernias, or organomegaly, multiple abdominal scars. MS: all 4 extremities are symmetrical with no, clubbing, or edema. Hands and feet with mild cyanosis. Unable to further assess due to unresponsive state. Skin: warm and dry with no masses, lesions, or rashes Neuro: opens eyes spontaneously after ROSC, otherwise unable to assess as he is now sedated on vent Psych: unable due to above   Results for orders placed or performed during the hospital encounter of 01/13/23 (from the past 48 hour(s))  CBG monitoring, ED     Status: None   Collection Time: 01/13/23 11:26 AM  Result Value Ref Range   Glucose-Capillary 86 70 - 99 mg/dL    Comment: Glucose reference range applies only to samples taken after fasting for at least 8 hours.   No results found.    Assessment/Plan Unwitnessed fall Cardiopulmonary arrest - ROSC obtained in the trauma bay.  Currently still brady on epi gtt.  Awaiting family arrival to determine code status.  Cardiology was present for a portion of trauma bay care and felt given his significant cardiac history, his EKG, and current state, that this is unlikely to be a survivable event.   Multiple bilateral rib fxs, Sternal body fx - likely 2/2 CPR AVDRF - continue vent support Dilated cardiomyopathy Systolic and diastolic CHF - EF [id number] fib - on eliquis. hold R parietal scalp hematoma - wound care Surgical hypothyroidism HTN Possible aspiration pna vs pulm edema  CT H, cspine negative  FEN - NPO VTE - none ID - none Dispo - Will consult ccm for admission. We will follow.  I spoke with his son at the bedside. Critical care 451m I reviewed nursing notes, ED provider notes, last 24 h vitals and pain scores, last 48 h intake and output, last 24 h labs and trends, and last 24 h imaging results.  BuGeorganna SkeansMD, MPH, FACS Please use [email or web address] to contact on call provider

## 2023-01-13 NOTE — Procedures (Signed)
Intubation Procedure Note  TAIVEN FAUPEL  [id number]  Aug 08, 1932  Date:01/13/23  Time:4:02 PM   Provider Performing:Thorvald Orsino    Procedure: Intubation (31500)  Indication(s) Respiratory Failure  Consent Risks of the procedure as well as the alternatives and risks of each were explained to the patient and/or caregiver.  Consent for the procedure was obtained and is signed in the bedside chart   Anesthesia Etomidate and Rocuronium   Time Out Verified patient identification, verified procedure, site/side was marked, verified correct patient position, special equipment/implants available, medications/allergies/relevant history reviewed, required imaging and test results available.   Sterile Technique Usual hand hygeine, masks, and gloves were used   Procedure Description Patient positioned in bed supine.  Sedation given as noted above.  Patient was intubated with endotracheal tube using  MAC4 .  View was Grade 1 full glottis .  Number of attempts was 1.  Colorimetric CO2 detector was consistent with tracheal placement.   Complications/Tolerance None; patient tolerated the procedure well. Chest X-ray is ordered to verify placement.   EBL Minimal   Specimen(s) None

## 2023-01-13 NOTE — ED Notes (Signed)
ICU at bedside

## 2023-01-13 NOTE — Progress Notes (Signed)
ETT tube got clogged with thick secretion and foreign body, tried to suction, couldn't remove foreign body. ETT tube was removed and replaced with 8.0 tube with improvement in peak pressures.    Jacky Kindle, MD [facility] See Amion for pager If no response to pager, please call [phone] until 7pm After 7pm, Please call E-link [phone]

## 2023-01-13 NOTE — Procedures (Signed)
Arterial Catheter Insertion Procedure Note  Curtis Ramos  [id number]  [birth year]  Date:01/13/23  Time:3:59 PM    Provider Performing: Jacky Kindle    Procedure: Insertion of Arterial Line [phone]) with US guidance [id number])   Indication(s) Blood pressure monitoring and/or need for frequent ABGs  Consent Risks of the procedure as well as the alternatives and risks of each were explained to the patient and/or caregiver.  Consent for the procedure was obtained and is signed in the bedside chart  Anesthesia None   Time Out Verified patient identification, verified procedure, site/side was marked, verified correct patient position, special equipment/implants available, medications/allergies/relevant history reviewed, required imaging and test results available.   Sterile Technique Maximal sterile technique including full sterile barrier drape, hand hygiene, sterile gown, sterile gloves, mask, hair covering, sterile ultrasound probe cover (if used).   Procedure Description Area of catheter insertion was cleaned with chlorhexidine and draped in sterile fashion. With real-time ultrasound guidance an arterial catheter was placed into the right  Axillary  artery.  Appropriate arterial tracings confirmed on monitor.     Complications/Tolerance None; patient tolerated the procedure well.   EBL Minimal   Specimen(s) None

## 2023-01-13 NOTE — H&P (Addendum)
NAME:  Curtis Ramos, MRN:  [id number], DOB:  11/05/1932, LOS: 0 ADMISSION DATE:  01/13/2023 CONSULTATION DATE:  01/13/2023 REFERRING MD:  Trauma CHIEF COMPLAINT:  Cardiac arrest, post-fall (Level 1 Trauma)  History of Present Illness:  89 year old man who presented to [facility] ED 2/29 via EMS after unwitnessed fall at work. PMHx significant for HTN, HFrEF with NICM (cMRI 12/2022 with severe LV dilation, EF 17%, LBBB, followed by Dr. Haroldine Laws), AFib (on Eliquis), MAI infection, GERD, hypothyroidism, history of thyroid CA.  History obtained from chart review and from family. Patient presented to [facility] ED via EMS after a fall at work; per family, patient did not have any complaints prior to fall. Recently undergoing workup with Cardiology for pacemaker placement. He was found down between a radiator and the wall, unresponsive with contusion and deformities to R side of his head. Initially with tachycardic with EMS, then bradycardic. ST elevation noted on EKG and Code STEMI was called, later called off as ST elevation had resolved. Patient lost pulses while in ED and became apneic with cardiac monitoring showing PEA arrest. CPR administered x 9 minutes with Epi x 4 and ROSC, patient never had a shockable rhythm. Intubated peri-arrest. Cards was consulted, noting high risk of VT/VF in the setting of severe CM. AHF was also notified of admission.  Labs demonstrated WBC 13.9, Hgb 14.1, Plt 169. INR 2.2 (on Eliquis). Na 138, K 4.1, CO2 18, Cr 1.79 (baseline ~1.3). CK 205. LA 7.0 > 4.6. Trop 27 > 155. BNP 1073. UA with glucose > 500, +protein. PCT < 0.10. CT Head NAICA, R parietal scalp hematoma. CT C-spine negative for acute findings. CT Chest/A/P with nondisplaced transverse fracture of mid-lower sternal body, acute fx of costal cartilage of bilateral 2nd-6th ribs + 7th costal cartilage, acute fx of anterior 2nd-7th ribs; pulmonary lobular interstitial thickening (?pulm edema), BLL opacities, trace ascites. Empiric  Unasyn started for ?aspiration.  PCCM was consulted for ICU admission.  Pertinent Medical History:   Past Medical History:  Diagnosis Date   Acute on chronic combined systolic and diastolic CHF (congestive heart failure) ([place]) 03/19/2019   AKI (acute kidney injury) ([place]) 12/26/2014   Arthralgia 11/04/2014   Arthritis    "proabably"   Atypical mycobacterium infection    Cancer [facility])    family unaware of this hx on [id number]   Chronic systolic heart failure (HCC)    Decreased oral intake 12/26/2014   Decreased oral intake 12/26/2014   Depressed left ventricular ejection fraction 02/05/2020   Dilated cardiomyopathy ([place]) 03/19/2019   Ejection fraction 20 to 25% based on echocardiogram done by pulmonologist month ago.   DJD (degenerative joint disease) 10/07/2014   Dyspnea on exertion 03/19/2019   Essential hypertension 02/05/2020   GERD (gastroesophageal reflux disease)    Hammer toes of both feet 01/30/2018   History of thyroid cancer 12/16/2016   Hypercalcemia 12/26/2014   Hypothyroidism    Hypothyroidism associated with surgical procedure 10/07/2014   Lung mass    Myalgia 11/04/2014   Mycobacterium avium complex ([place])    Nail dystrophy 01/30/2018   Occult malignancy (HCC)    PAF (paroxysmal atrial fibrillation) ([place]) 02/05/2020   Persistent atrial fibrillation ([place]) 03/19/2019   Chads 2 Vascor equals 3   PONV (postoperative nausea and vomiting)    Pre-ulcerative calluses 01/30/2018   Pseudoaneurysm following procedure ([place]) 04/16/2020   Sensorineural hearing loss (SNHL), bilateral 08/11/2018   Shingles    Toenail fungus 01/30/2018   Wears glasses  Significant Hospital Events: Including procedures, antibiotic start and stop dates in addition to other pertinent events   2/29 - Presented to [facility] ED after 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 Found between radiator and wall with R-sided head injury. Suspected cardiac etiology of unresponsiveness. On ED arrival, became  pulseless (rhythm PEA). CPR x 9 min, Epi x 4 with ROSC. Intubated peri-arrest. CT Head, C-Spine, Chest/A/P completed (as above). Unasyn for ?aspiration. R axillary A-line, L subclavian CVC, ETT tube exchange + bronch.  Interim History / Subjective:  PCCM consulted for ICU admission post-PEA arrest  Objective:  Blood pressure (!) 164/77, pulse (!) 35, resp. rate 14, SpO2 (!) 80 %.    Vent Mode: PRVC FiO2 (%):  [100 %] 100 % Set Rate:  [15 bmp-20 bmp] 20 bmp Vt Set:  [500 mL] 500 mL PEEP:  [10 cmH20] 10 cmH20 Plateau Pressure:  [23 cmH20] 23 cmH20  No intake or output data in the 24 hours ending 01/13/23 1256 There were no vitals filed for this visit.  Physical Examination: General: Acutely ill-appearing elderly man in NAD. Intubated, sedated. HEENT: R scalp with copious dried blood, palpable swelling. Anicteric sclera, R pupil 82m minimally/nonreactive, L pupil 456msluggishly reactive, moist mucous membranes. Blood on lips. Neuro: Sedated. Does not respond to verbal, tactile or noxious stimuli. Withdraws to pain in all 4 extremities.Not following commands. No spontaneous movement of extremities noted on exam. Weak cough/gag. CV: Borderline bradycardic, no m/g/r. Chest: +Flail chest on R, multiple rib fractures/chest deformity. PULM: Breathing even and unlabored on vent (PEEP 12, FiO2 100%). Lung fields with coarse bilateral breath sounds. GI: Soft, nontender, nondistended. Hypoactive bowel sounds. Extremities: No LE edema noted. Skin: Cool/dry, no rashes. Chronic venous stasis changes.  Resolved Hospital Problem List:    Assessment & Plan:  Post-PEA arrest Shock, presumed primarily cardiogenic PCT < 0.10, doubt infection as primary concern - Admit to ICU for further care - Goal MAP > 65 - Fluid resuscitation as tolerated, caution in the setting of HF/CM - Levophed, Epinephrine titrated to goal MAP - F/u Co-ox - Trend WBC, fever curve, LA  Severe nonischemic cardiomyopathy (EF  17%) HFrEF Afib cMRI 12/2022 with severe LV dilation, EF 17%, LBBB, followed by Dr. BeHaroldine LawsHome meds include Coreg, FaLisabeth RegisterLasix. On Eliquis, amiodarone for Afib.  - Cardiology consulted, AHF aware of patient's admission - Obtain Co-ox as above - F/u Echo - Cardiac monitoring, CVP - Holding AC for now, given trauma - Hold amio in the setting of bradycardia - Hold GDMT for now in the setting of hypotension/shock  Acute hypoxemic respiratory failure Concern for aspiration PNA History of MAI infection Bronched at bedside after ETT exchange with copious red-brown secretions c/w aspiration. - Continue full vent support (4-8cc/kg IBW) - Wean FiO2 for O2 sat > 90% - Daily WUA/SBT - VAP bundle - Pulmonary hygiene - PAD protocol for sedation: Propofol and Fentanyl for goal RASS -1 to -2 - Follow CXR - Empiric Unasyn for ?aspiration - F/u Resp Cx/TA  Acute encephalopathy, multifactorial CT Head NAICA, R parietal scalp hematoma. C-spine negative - Frequent neuro checks - Low threshold for repeat CT if changes in neuro exam - MRI Brain 72H post-arrest to assess - Mannitol 20% x 1 for question of cerebral edema - Neuroprotective measures: HOB > 30 degrees, normoglycemia, normothermia, electrolytes WNL  AKI, likely ATN in the setting of hemodynamic insults CKD stage 2 - Trend BMP - Replete electrolytes as indicated - Monitor  I&Os - F/u urine studies - Avoid nephrotoxic agents as able - Ensure adequate renal perfusion  Hypothyroidism - Resume home levothyroxine  At risk for hyperglycemia - SSI - CBGs Q4H - Goal CBG 140-180  Best Practice: (right click and "Reselect all SmartList Selections" daily)   Diet/type: NPO DVT prophylaxis: SCDs GI prophylaxis: PPI Lines: Central line and Arterial Line Foley:  Yes, and it is still needed Code Status:  full code Last date of multidisciplinary goals of care discussion [Bedside in ED with patient's 3 sons - confirm  wishes for FULL CODE status]  Labs:  CBC: Recent Labs  Lab 01/13/23 1223  WBC 13.9*  HGB 14.1  15.3  HCT 42.6  45.0  MCV 114.2*  PLT [id number]   Basic Metabolic Panel: Recent Labs  Lab 01/13/23 1223  NA 138  K 4.6  CL 107  GLUCOSE 158*  BUN 41*  CREATININE 2.00*   GFR: Estimated Creatinine Clearance: 24.9 mL/min (A) (by C-G formula based on SCr of 2 mg/dL (H)). Recent Labs  Lab 01/13/23 1223  WBC 13.9*   Liver Function Tests: No results for input(s): "AST", "ALT", "ALKPHOS", "BILITOT", "PROT", "ALBUMIN" in the last 168 hours. No results for input(s): "LIPASE", "AMYLASE" in the last 168 hours. No results for input(s): "AMMONIA" in the last 168 hours.  ABG:    Component Value Date/Time   TCO2 24 01/13/2023 1223    Coagulation Profile: Recent Labs  Lab 01/13/23 1223  INR 2.2*   Cardiac Enzymes: No results for input(s): "CKTOTAL", "CKMB", "CKMBINDEX", "TROPONINI" in the last 168 hours.  HbA1C: No results found for: "HGBA1C"  CBG: Recent Labs  Lab 01/13/23 1126  GLUCAP 86   Review of Systems:   Patient is encephalopathic and/or intubated. Therefore history has been obtained from chart review.   Past Medical History:  He,  has a past medical history of Acute on chronic combined systolic and diastolic CHF (congestive heart failure) ([place]) (03/19/2019), AKI (acute kidney injury) ([place]) (12/26/2014), Arthralgia (11/04/2014), Arthritis, Atypical mycobacterium infection, Cancer ([place]), Chronic systolic heart failure ([place]), Decreased oral intake (12/26/2014), Decreased oral intake (12/26/2014), Depressed left ventricular ejection fraction (02/05/2020), Dilated cardiomyopathy ([place]) (03/19/2019), DJD (degenerative joint disease) (10/07/2014), Dyspnea on exertion (03/19/2019), Essential hypertension (02/05/2020), GERD (gastroesophageal reflux disease), Hammer toes of both feet (01/30/2018), History of thyroid cancer (12/16/2016), Hypercalcemia (12/26/2014), Hypothyroidism, Hypothyroidism  associated with surgical procedure (10/07/2014), Lung mass, Myalgia (11/04/2014), Mycobacterium avium complex ([place]), Nail dystrophy (01/30/2018), Occult malignancy ([place]), PAF (paroxysmal atrial fibrillation) ([place]) (02/05/2020), Persistent atrial fibrillation ([place]) (03/19/2019), PONV (postoperative nausea and vomiting), Pre-ulcerative calluses (01/30/2018), Pseudoaneurysm following procedure ([place]) (04/16/2020), Sensorineural hearing loss (SNHL), bilateral (08/11/2018), Shingles, Toenail fungus (01/30/2018), and Wears glasses.   Surgical History:   Past Surgical History:  Procedure Laterality Date   APPENDECTOMY     CATARACT EXTRACTION W/ INTRAOCULAR LENS  IMPLANT, BILATERAL Bilateral    COLONOSCOPY     INGUINAL HERNIA REPAIR Left    LEFT HEART CATH AND CORONARY ANGIOGRAPHY N/A 03/11/2020   Procedure: LEFT HEART CATH AND CORONARY ANGIOGRAPHY;  Surgeon: Jettie Booze, MD;  Location: [facility];  Service: Cardiovascular;  Laterality: N/A;   MASS EXCISION Right 08/27/2014   Procedure: EXCISION MASS RIGHT PALM/INDEX;  Surgeon: Leanora Cover, MD;  Location: [facility];  Service: Orthopedics;  Laterality: Right;   THYROIDECTOMY  2003    Social History:   reports that he has never smoked. He has never used smokeless tobacco. He reports that he does not  drink alcohol and does not use drugs.   Family History:  His family history is not on file.   Allergies: No Known Allergies   Home Medications: Prior to Admission medications   Medication Sig Start Date End Date Taking? Authorizing Provider  amiodarone (PACERONE) 200 MG tablet Take 200 mg by mouth daily.    [provider]  carvedilol (COREG) 6.25 MG tablet Take 1 tablet (6.25 mg total) by mouth 2 (two) times daily. 12/31/22   Bensimhon, Shaune Pascal, MD  dapagliflozin propanediol (FARXIGA) 10 MG TABS tablet Take 1 tablet (10 mg total) by mouth daily before breakfast. 10/18/22   Park Liter, MD  DOCUSATE SODIUM PO  Take 1 tablet by mouth daily.    [provider]  ELIQUIS 5 MG TABS tablet TAKE 1 TABLET BY MOUTH TWICE A DAY 12/03/22   Park Liter, MD  furosemide (LASIX) 20 MG tablet Take 4 tablets (80 mg total) by mouth daily. 12/07/22   Bensimhon, Shaune Pascal, MD  polyethylene glycol (MIRALAX / GLYCOLAX) 17 g packet Take 17 g by mouth daily.    [provider]  sacubitril-valsartan (ENTRESTO) 49-51 MG Take 1 tablet by mouth 2 (two) times daily. 11/18/22   Park Liter, MD  SYNTHROID 175 MCG tablet Take 175 mcg by mouth daily. 05/16/22   [provider]    Critical care time:   The patient is critically ill with multiple organ system failure and requires high complexity decision making for assessment and support, frequent evaluation and titration of therapies, advanced monitoring, review of radiographic studies and interpretation of complex data.   Critical Care Time devoted to patient care services, exclusive of separately billable procedures, described in this note is 48 minutes.  Lestine Mount, PA-C [facility] 01/13/23 4:30 PM  Please see [email or web address] for pager details.  From 7A-7P if no response, please call [phone] After hours, please call ELink [phone]

## 2023-01-13 NOTE — Plan of Care (Signed)

## 2023-01-13 NOTE — Progress Notes (Signed)
Responded to page to support patient that experienced witnessed fall and became unconscious. Chaplain support patient and Staff. Chaplain available as needed.  Jaclynn Major, [place], [facility], Pager [phone]

## 2023-01-13 NOTE — Consult Note (Signed)
Cardiology Consultation   Patient ID: Curtis Ramos MRN: [id number]; DOB: [birth year]  Admit date: 01/13/2023 Date of Consult: 01/13/2023  PCP:  Curtis Ramos, [place] Providers Cardiologist:  None  Electrophysiologist:  Curtis Epley, MD       Patient Profile:   Curtis Ramos is a 89 y.o. male with a hx of nonischemic cardiomyopathy who is being seen 01/13/2023 for the evaluation of cardiac arrest at the request of Dr Curtis Ramos.  History of Present Illness:   Curtis Ramos is an 89 year old gentleman who was reportedly at work today when he suddenly collapsed to the ground.  He was seen to be down the side of radiator with a potential unwitnessed fall.  His initial EKG demonstrated left bundle branch block and a code STEMI was paged out.  After being notified and reviewing his baseline EKG with left bundle branch block, the code STEMI was canceled.  The patient ultimately lost pulses and CPR was initiated.  He received 4 rounds of epinephrine and 9 minutes of CPR with ROSC.  After regaining a pulse he became markedly bradycardic and was treated with atropine and placed on an epinephrine infusion.  The patient was intubated on arrival to the emergency department.  There is no other history obtainable at this time.  He is evaluated alongside the emergency room physician and the trauma service.  He is being taken to the CAT scanner for a trauma scan.   Past Medical History:  Diagnosis Date   Acute on chronic combined systolic and diastolic CHF (congestive heart failure) ([place]) 03/19/2019   AKI (acute kidney injury) ([place]) 12/26/2014   Arthralgia 11/04/2014   Arthritis    "proabably"   Atypical mycobacterium infection    Cancer [facility])    family unaware of this hx on [id number]   Chronic systolic heart failure (HCC)    Decreased oral intake 12/26/2014   Decreased oral intake 12/26/2014   Depressed left ventricular ejection fraction 02/05/2020   Dilated cardiomyopathy ([place])  03/19/2019   Ejection fraction 20 to 25% based on echocardiogram done by pulmonologist month ago.   DJD (degenerative joint disease) 10/07/2014   Dyspnea on exertion 03/19/2019   Essential hypertension 02/05/2020   GERD (gastroesophageal reflux disease)    Hammer toes of both feet 01/30/2018   History of thyroid cancer 12/16/2016   Hypercalcemia 12/26/2014   Hypothyroidism    Hypothyroidism associated with surgical procedure 10/07/2014   Lung mass    Myalgia 11/04/2014   Mycobacterium avium complex ([place])    Nail dystrophy 01/30/2018   Occult malignancy (HCC)    PAF (paroxysmal atrial fibrillation) ([place]) 02/05/2020   Persistent atrial fibrillation ([place]) 03/19/2019   Chads 2 Vascor equals 3   PONV (postoperative nausea and vomiting)    Pre-ulcerative calluses 01/30/2018   Pseudoaneurysm following procedure ([place]) 04/16/2020   Sensorineural hearing loss (SNHL), bilateral 08/11/2018   Shingles    Toenail fungus 01/30/2018   Wears glasses     Past Surgical History:  Procedure Laterality Date   APPENDECTOMY     CATARACT EXTRACTION W/ INTRAOCULAR LENS  IMPLANT, BILATERAL Bilateral    COLONOSCOPY     INGUINAL HERNIA REPAIR Left    LEFT HEART CATH AND CORONARY ANGIOGRAPHY N/A 03/11/2020   Procedure: LEFT HEART CATH AND CORONARY ANGIOGRAPHY;  Surgeon: Jettie Booze, MD;  Location: [facility];  Service: Cardiovascular;  Laterality: N/A;   MASS EXCISION Right 08/27/2014   Procedure: EXCISION MASS  RIGHT PALM/INDEX;  Surgeon: Leanora Cover, MD;  Location: [facility];  Service: Orthopedics;  Laterality: Right;   THYROIDECTOMY  2003     Home Medications:  Prior to Admission medications   Medication Sig Start Date End Date Taking? Authorizing Provider  amiodarone (PACERONE) 200 MG tablet Take 200 mg by mouth daily.    [provider]  carvedilol (COREG) 6.25 MG tablet Take 1 tablet (6.25 mg total) by mouth 2 (two) times daily. 12/31/22   Bensimhon, Shaune Pascal, MD   dapagliflozin propanediol (FARXIGA) 10 MG TABS tablet Take 1 tablet (10 mg total) by mouth daily before breakfast. 10/18/22   Park Liter, MD  DOCUSATE SODIUM PO Take 1 tablet by mouth daily.    [provider]  ELIQUIS 5 MG TABS tablet TAKE 1 TABLET BY MOUTH TWICE A DAY 12/03/22   Park Liter, MD  furosemide (LASIX) 20 MG tablet Take 4 tablets (80 mg total) by mouth daily. 12/07/22   Bensimhon, Shaune Pascal, MD  polyethylene glycol (MIRALAX / GLYCOLAX) 17 g packet Take 17 g by mouth daily.    [provider]  sacubitril-valsartan (ENTRESTO) 49-51 MG Take 1 tablet by mouth 2 (two) times daily. 11/18/22   Park Liter, MD  SYNTHROID 175 MCG tablet Take 175 mcg by mouth daily. 05/16/22   [provider]    Inpatient Medications: Scheduled Meds:  fentaNYL       fentaNYL (SUBLIMAZE) injection  25 mcg Intravenous Once   Continuous Infusions:  epinephrine 20 mcg/min (01/13/23 1138)   fentaNYL infusion INTRAVENOUS     norepinephrine (LEVOPHED) Adult infusion     propofol (DIPRIVAN) infusion 20 mcg/kg/min (01/13/23 1142)   PRN Meds: fentaNYL, fentaNYL (SUBLIMAZE) injection, fentaNYL (SUBLIMAZE) injection  Allergies:   No Known Allergies  Social History:   Social History   Socioeconomic History   Marital status: Married    Spouse name: Not on file   Number of children: Not on file   Years of education: Not on file   Highest education level: Not on file  Occupational History   Not on file  Tobacco Use   Smoking status: Never   Smokeless tobacco: Never  Substance and Sexual Activity   Alcohol use: No   Drug use: No   Sexual activity: Not on file  Other Topics Concern   Not on file  Social History Narrative   Not on file   Social Determinants of Health   Financial Resource Strain: Not on file  Food Insecurity: Not on file  Transportation Needs: Not on file  Physical Activity: Not on file  Stress: Not on file  Social Connections: Not  on file  Intimate Partner Violence: Not on file    Family History:   History reviewed. No pertinent family history.   ROS:  Please see the history of present illness.  Otherwise not obtainable at this time.   Physical Exam/Data:   Vitals:   01/13/23 1124 01/13/23 1126 01/13/23 1127 01/13/23 1130  BP: (!) 120/91 (!) 62/13 (!) 81/21 (!) 164/77  Pulse: (!) 120 (!) 103 (!) 125 (!) 35  Resp: (!) 40 (!) 58 (!) 45 14  SpO2: (!) 43% (!) 45%  (!) 80%   No intake or output data in the 24 hours ending 01/13/23 1155    12/31/2022   11:13 AM 12/15/2022    1:51 PM 12/07/2022   11:37 AM  Last 3 Weights  Weight (lbs) 152 lb 155 lb 12.8  oz 158 lb 9.6 oz  Weight (kg) 68.947 kg 70.67 kg 71.94 kg     There is no height or weight on file to calculate BMI.  General: Elderly male, intubated, unresponsive HEENT: normal Neck: no JVD Vascular: No carotid bruits; femoral pulses 2+ bilaterally Cardiac: Distant heart sounds, bradycardic and regular; no murmur  Lungs:  clear to auscultation bilaterally, no wheezing, rhonchi or rales  Abd: soft, no hepatomegaly  Ext: no edema Musculoskeletal:  No deformities, BUE and BLE strength normal and equal Skin: warm and dry  Neuro: Unresponsive Psych: Unable to assess  EKG:  The EKG was personally reviewed and demonstrates: Initial EKG from the field shows atrial fibrillation with left bundle branch block.  Follow-up EKG shows a ventricular escape rhythm with a rate of 40 bpm  Relevant CV Studies: Cardiac MRI: IMPRESSION: 1. Small peripheral lung nodules, consider CT w/o contrast to fully evaluate.   2. Severe LV dilation with global hypokinesis and septal-lateral dyssynchrony consistent with LBBB. EF 17%.   3.  Upper normal RV size with EF 33%.   4. Visually moderate MR, regurgitant fraction calculated by flow is inaccurate on this study.   5. No myocardial LGE so no definitive evidence for prior MI, infiltrative disease, or myocarditis.   6.  Mildly elevated extracellular volume percentage suggestive of mild myocardial fibrosis (nonspecific).  Cardiac Cath 0000000: LV end diastolic pressure is normal. There is no aortic valve stenosis. Minimal, nonobstructive CAD.   Continue medical therapy for LV dysfunction, nonischemic cardiomyopathy.   Laboratory Data:  High Sensitivity Troponin:  No results for input(s): "TROPONINIHS" in the last 720 hours.   ChemistryNo results for input(s): "NA", "K", "CL", "CO2", "GLUCOSE", "BUN", "CREATININE", "CALCIUM", "MG", "GFRNONAA", "GFRAA", "ANIONGAP" in the last 168 hours.  No results for input(s): "PROT", "ALBUMIN", "AST", "ALT", "ALKPHOS", "BILITOT" in the last 168 hours. Lipids No results for input(s): "CHOL", "TRIG", "HDL", "LABVLDL", "LDLCALC", "CHOLHDL" in the last 168 hours.  HematologyNo results for input(s): "WBC", "RBC", "HGB", "HCT", "MCV", "MCH", "MCHC", "RDW", "PLT" in the last 168 hours. Thyroid No results for input(s): "TSH", "FREET4" in the last 168 hours.  BNPNo results for input(s): "BNP", "PROBNP" in the last 168 hours.  DDimer No results for input(s): "DDIMER" in the last 168 hours.   Radiology/Studies:  No results found.   Assessment and Plan:   PEA cardiac arrest Severe nonischemic cardiomyopathy with chronic systolic heart failure, LVEF 17% by recent cardiac MRI Chronic persistent atrial fibrillation on apixaban Unwitnessed fall Chronic kidney disease stage IIIb  Unclear etiology of his arrest at this point.  Personally discussed with EMS team and the patient never had a shockable rhythm.  He obviously is at very high risk of VT/VF with his severe cardiomyopathy.  He is going through the CT scanner now to look for any significant injury related to his fall.  He will continue supportive care with IV epi infusion and vasopressor support.  The patient's family is in route and his goals of care will be discussed.  At his advanced age with severe LV dysfunction and  cardiac arrest, his in-hospital risk of mortality is exceedingly high.  All labs are currently pending, but will evaluate lactate level, ABG, and baseline labs as soon as they become available.  The advanced heart failure team will be notified of his admission.  We will follow with you.  The patient is critically ill with multiple organ systems failure and requires high complexity decision making for assessment and support, frequent  evaluation and titration of therapies, application of advanced monitoring technologies and extensive interpretation of multiple databases.   Critical Care Time devoted to patient care services described in this note is 40 minutes    Risk Assessment/Risk Scores:        For questions or updates, please contact [place] Please consult [email or web address] for contact info under    Signed, Sherren Mocha, MD  01/13/2023 11:55 AM

## 2023-01-13 NOTE — ED Notes (Signed)
Pt arrived via ems per medic dicussed with family pt had no complaints this morning although he was due for a pacemaker and has extensive cardiac hx Pt was found down unresponsive with contusion with deformities to right side head pt intimal bp 114/52 per medic pt had ST elevation   Upon arrival to er room py became pulseless and apneic cpr started at 1120

## 2023-01-13 NOTE — Progress Notes (Signed)
Orthopedic Tech Progress Note Patient Details:  [name] 1932-05-03 [id number]  Level I trauma, ortho tech not needed at this time  Patient ID: Curtis Ramos, male   DOB: 1932-03-13, 89 y.o.   MRN: [id number]  Carin Primrose 01/13/2023, 5:56 PM

## 2023-01-13 NOTE — Progress Notes (Signed)
Pt was transported to [facility] Ramos via ventilator with no apparent complications. Curtis Ramos RT report given.

## 2023-01-13 NOTE — ED Provider Notes (Signed)
[facility] [facility] Provider Note   CSN: [id number] Arrival date & time: 01/13/23  1119     History  No chief complaint on file.   Curtis Ramos is a 89 y.o. male.  Patient is an 89 year old male with a past medical history of CHF with reduced EF, A-fib on Eliquis that presented to the emergency department for unresponsiveness.  Per EMS, bystanders called 911 after finding him falling to the ground stuck between the wall and the radiator.  He had a depressed mental status and route and was hypoxic to 84% on room air on medics arrival and was placed on nonrebreather mask.  The patient noted to have some bleeding to his posterior scalp and was made a prehospital arrival level 1 trauma due to his fall and abnormal mental status.  Further history was limited due to patient's critical acuity.  The history is provided by the EMS personnel. The history is limited by the condition of the patient.       Home Medications Prior to Admission medications   Medication Sig Start Date End Date Taking? Authorizing Provider  amiodarone (PACERONE) 200 MG tablet Take 200 mg by mouth daily.    [provider]  carvedilol (COREG) 6.25 MG tablet Take 1 tablet (6.25 mg total) by mouth 2 (two) times daily. 12/31/22   Bensimhon, Shaune Pascal, MD  dapagliflozin propanediol (FARXIGA) 10 MG TABS tablet Take 1 tablet (10 mg total) by mouth daily before breakfast. 10/18/22   Park Liter, MD  DOCUSATE SODIUM PO Take 1 tablet by mouth daily.    [provider]  ELIQUIS 5 MG TABS tablet TAKE 1 TABLET BY MOUTH TWICE A DAY 12/03/22   Park Liter, MD  furosemide (LASIX) 20 MG tablet Take 4 tablets (80 mg total) by mouth daily. 12/07/22   Bensimhon, Shaune Pascal, MD  polyethylene glycol (MIRALAX / GLYCOLAX) 17 g packet Take 17 g by mouth daily.    [provider]  sacubitril-valsartan (ENTRESTO) 49-51 MG Take 1 tablet by mouth 2 (two) times daily. 11/18/22    Park Liter, MD  SYNTHROID 175 MCG tablet Take 175 mcg by mouth daily. 05/16/22   [provider]      Allergies    Patient has no known allergies.    Review of Systems   Review of Systems  Physical Exam Updated Vital Signs BP (!) 149/84   Pulse 75   Resp (!) 25   Ht '5\' 11"'$  (1.803 m)   SpO2 100%   BMI 21.20 kg/m  Physical Exam Vitals and nursing note reviewed.  Constitutional:      Comments: Unresponsive, agonal breathing  HENT:     Head: Normocephalic.     Comments: Small amount of blood on posterior scalp    Nose: Nose normal.     Mouth/Throat:     Mouth: Mucous membranes are moist.     Pharynx: Oropharynx is clear.  Eyes:     Extraocular Movements: Extraocular movements intact.     Conjunctiva/sclera: Conjunctivae normal.     Comments: Pupils 5 mm bilaterally  Neck:     Comments: C-collar in place, no step offs Cardiovascular:     Rate and Rhythm: Regular rhythm. Bradycardia present.     Comments: Faint carotid pulse Pulmonary:     Breath sounds: Normal breath sounds.     Comments: Agonal breathing Abdominal:     General: Abdomen is flat.  Palpations: Abdomen is soft.  Musculoskeletal:        General: No swelling or deformity.     Comments: No palpable step offs on thoracic or lumbar spine Pelvis stable  Skin:    General: Skin is dry.     Comments: Cool distal extremities  Neurological:     Comments: GCS 3     ED Results / Procedures / Treatments   Labs (all labs ordered are listed, but only abnormal results are displayed) Labs Reviewed  COMPREHENSIVE METABOLIC PANEL - Abnormal; Notable for the following components:      Result Value   CO2 18 (*)    Glucose, Bld 187 (*)    BUN 31 (*)    Creatinine, Ser 1.79 (*)    Calcium 8.3 (*)    Total Protein 6.0 (*)    Albumin 3.1 (*)    AST 85 (*)    ALT 48 (*)    GFR, Estimated 36 (*)    All other components within normal limits  LACTIC ACID, PLASMA - Abnormal; Notable for the  following components:   Lactic Acid, Venous 7.0 (*)    All other components within normal limits  CBC - Abnormal; Notable for the following components:   WBC 13.9 (*)    RBC 3.73 (*)    MCV 114.2 (*)    MCH 37.8 (*)    All other components within normal limits  PROTIME-INR - Abnormal; Notable for the following components:   Prothrombin Time 24.4 (*)    INR 2.2 (*)    All other components within normal limits  I-STAT CHEM 8, ED - Abnormal; Notable for the following components:   BUN 41 (*)    Creatinine, Ser 2.00 (*)    Glucose, Bld 158 (*)    Calcium, Ion 1.11 (*)    All other components within normal limits  I-STAT ARTERIAL BLOOD GAS, ED - Abnormal; Notable for the following components:   pH, Arterial 7.194 (*)    pCO2 arterial 55.0 (*)    pO2, Arterial 68 (*)    Acid-base deficit 8.0 (*)    All other components within normal limits  TROPONIN I (HIGH SENSITIVITY) - Abnormal; Notable for the following components:   Troponin I (High Sensitivity) 27 (*)    All other components within normal limits  ETHANOL  CK  URINALYSIS, ROUTINE W REFLEX MICROSCOPIC  LACTIC ACID, PLASMA  LACTIC ACID, PLASMA  PROCALCITONIN  HEMOGLOBIN A1C  BRAIN NATRIURETIC PEPTIDE  CBG MONITORING, ED  SAMPLE TO BLOOD BANK  TROPONIN I (HIGH SENSITIVITY)    EKG EKG Interpretation  Date/Time:  Thursday January 13 2023 11:31:52 EST Ventricular Rate:  40 PR Interval:  580 QRS Duration: 201 QT Interval:  501 QTC Calculation: 409 R Axis:   262 Text Interpretation: Sinus bradycardia Prolonged PR interval Nonspecific IVCD with LAD Consider left ventricular hypertrophy Abnormal T, consider ischemia, lateral leads Significant bradycardia compared to prior EKG Confirmed by Leanord Asal ([id number]) on 01/13/2023 11:47:56 AM  Radiology CT CHEST ABDOMEN PELVIS WO CONTRAST  Result Date: 01/13/2023 CLINICAL DATA:  Patient found down, cardiopulmonary resuscitation EXAM: CT CHEST, ABDOMEN AND PELVIS WITHOUT  CONTRAST TECHNIQUE: Multidetector CT imaging of the chest, abdomen and pelvis was performed following the standard protocol without IV contrast. RADIATION DOSE REDUCTION: This exam was performed according to the departmental dose-optimization program which includes automated exposure control, adjustment of the mA and/or kV according to patient size and/or use of iterative reconstruction technique. COMPARISON:  CT  examinations from 06/12/2022 FINDINGS: CT CHEST FINDINGS Cardiovascular: Cardiomegaly noted. Atherosclerotic calcification of the thoracic aorta and branch vessels. Today's exam is performed without IV contrast and accordingly vascular patency is not assessed. Mediastinum/Nodes: Orogastric tube terminates at the gastroesophageal junction, consider advancing 10 cm. Nasogastric tube satisfactorily positioned. Very small retrosternal hematoma as on image 33 series 2, associated with the nondisplaced transverse sternal fracture. Lungs/Pleura: Secondary pulmonary lobular interstitial thickening with scattered airspace opacities which have accentuated density dependently favoring acute pulmonary edema. More confluent airspace opacities are present in the lower lobes and posteriorly in the right upper lobe, and aspiration pneumonia is not excluded. Small areas of confluent nodularity for example in the right middle lobe on image 80 of series 4 and in the lingula on image 75 series 4, probably due to edema or infection although surveillance imaging will be recommended to exclude progression. No substantial pleural effusion. Musculoskeletal: In addition to more cephalad deformity in the sternal body from an old healed fracture, there is a transverse mid to lower sternal body fracture shown on image 64 series 6, extending in between the costosternal junctions of the third and fourth ribs. There are vertical fractures in the costal cartilage of the right second, third, fourth, fifth, sixth, and seventh ribs best  appreciated on coronal images. There also fractures in the costal cartilage of the left anterior second, third, fourth, fifth, and sixth rib costal cartilage. In addition are fractures of the bony portions of the bilateral anterior second, third, fourth, fifth, sixth, and seventh ribs. Chondrocalcinosis in both glenohumeral joints. Thoracic spondylosis. CT ABDOMEN PELVIS FINDINGS Hepatobiliary: Trace perihepatic ascites. Reduced sensitivity for hepatic laceration due to streak artifact and lack of IV contrast. Pancreas: Unremarkable Spleen: Unremarkable Adrenals/Urinary Tract: Unremarkable Stomach/Bowel: Unremarkable Vascular/Lymphatic: Atherosclerosis is present, including aortoiliac atherosclerotic disease. Reproductive: Small bilateral hydroceles. Suspected epididymal cysts or spermatoceles bilaterally. Other: No supplemental non-categorized findings. Musculoskeletal: Calcific tendinopathy proximally in both hamstring tendons. Stable grade 1 degenerative anterolisthesis at L3-4 and L4-5. Chondrocalcinosis of the acetabular labrum bilaterally. IMPRESSION: 1. Acute nondisplaced transverse fracture of the mid to lower sternal body. 2. Acute fractures to the costal cartilage of the bilateral second through sixth ribs and also the right seventh costal cartilage. There are also acute bilateral fractures of the bony anterior portions of the bilateral second through seventh ribs. 3. Secondary pulmonary lobular interstitial thickening with scattered airspace opacities favoring acute pulmonary edema. More confluent airspace opacities are present in the lower lobes and posteriorly in the right upper lobe, and aspiration pneumonia is not excluded. 4. Trace perihepatic ascites. 5. Orogastric tube terminates at the gastroesophageal junction, consider advancing 10 cm. 6. Small bilateral hydroceles. Suspected epididymal cysts or spermatoceles bilaterally. 7. Chondrocalcinosis in both glenohumeral joints, acetabular labrum, and  both hamstring tendons. 8. Aortic atherosclerosis. Aortic Atherosclerosis (ICD10-I70.0). Electronically Signed   By: Van Clines M.D.   On: 01/13/2023 12:37   CT CERVICAL SPINE WO CONTRAST  Result Date: 01/13/2023 CLINICAL DATA:  Patient found down between a radiator and a wall. Cardiopulmonary resuscitation. Right hand injury. EXAM: CT CERVICAL SPINE WITHOUT CONTRAST TECHNIQUE: Multidetector CT imaging of the cervical spine was performed without intravenous contrast. Multiplanar CT image reconstructions were also generated. RADIATION DOSE REDUCTION: This exam was performed according to the departmental dose-optimization program which includes automated exposure control, adjustment of the mA and/or kV according to patient size and/or use of iterative reconstruction technique. COMPARISON:  None Available. FINDINGS: Alignment: 2 mm free fused anterolisthesis at C4-5 with interbody and bilateral  facet fusion. Skull base and vertebrae: Substantial spurring at the anterior C1-2 articulation. Notable pannus posterior to the odontoid. No cervical spine fracture or acute bony findings. Soft tissues and spinal canal: Edema and airspace opacities at the lung bases. Orogastric tube and endotracheal tube noted. Bilateral carotid atherosclerotic calcification. Thyroidectomy. Disc levels: Uncinate and facet spurring cause mild right foraminal impingement at C5-6 and C6-7 along with left foraminal impingement at C3-4, C4-5, and C5-6. There is moderate central narrowing of the thecal sac at C3-4 due to chronic disc osteophyte complex and left paracentral disc protrusion. Upper chest: Please see dedicated chest CT report. Other: No supplemental non-categorized findings. IMPRESSION: 1. No acute cervical spine findings. 2. Cervical spondylosis and degenerative disc disease causing multilevel foraminal impingement. There is also moderate central narrowing of the thecal sac at C3-4 due to chronic disc osteophyte complex and  left paracentral disc protrusion. 3. 2 mm free fused anterolisthesis at C4-5. 4. Edema and airspace opacities at the lung bases. 5. Bilateral carotid atherosclerotic calcification. Electronically Signed   By: Van Clines M.D.   On: 01/13/2023 12:23   CT HEAD WO CONTRAST  Result Date: 01/13/2023 CLINICAL DATA:  Head trauma. EXAM: CT HEAD WITHOUT CONTRAST TECHNIQUE: Contiguous axial images were obtained from the base of the skull through the vertex without intravenous contrast. RADIATION DOSE REDUCTION: This exam was performed according to the departmental dose-optimization program which includes automated exposure control, adjustment of the mA and/or kV according to patient size and/or use of iterative reconstruction technique. COMPARISON:  Head MRI 06/30/2017 FINDINGS: Brain: There is no evidence of an acute infarct, intracranial hemorrhage, mass, midline shift, or extra-axial fluid collection. Mild cerebral atrophy is within normal limits for age. Cerebral white matter hypodensities are nonspecific but compatible with mild chronic small vessel ischemic disease. Vascular: Calcified atherosclerosis at the skull base. No hyperdense vessel. Skull: No acute fracture or suspicious osseous lesion. Sinuses/Orbits: Trace fluid in the right sphenoid sinus. Clear mastoid air cells. Bilateral cataract extraction. Other: Small to moderate-sized right parietal scalp hematoma and laceration. IMPRESSION: 1. No evidence of acute intracranial abnormality. 2. Right parietal scalp hematoma. Electronically Signed   By: Logan Bores M.D.   On: 01/13/2023 12:22   DG Pelvis Portable  Result Date: 01/13/2023 CLINICAL DATA:  Possible trauma, found unresponsive at work EXAM: PORTABLE PELVIS 1-2 VIEWS COMPARISON:  None Available. FINDINGS: No displaced fracture or dislocation is seen. Bony spurs are noted in both hips. Scattered vascular calcifications are seen in soft tissues. Degenerative changes are noted in visualized lower  lumbar spine. IMPRESSION: No displaced fracture or dislocation is seen. Electronically Signed   By: Elmer Picker M.D.   On: 01/13/2023 12:10   DG Chest Port 1 View  Result Date: 01/13/2023 CLINICAL DATA:  Pain after trauma EXAM: PORTABLE CHEST 1 VIEW COMPARISON:  X-ray 06/12/2022 FINDINGS: ET tube in place with tip 5 cm above the carina enlarged cardiopericardial silhouette. Overlapping cardiac leads and defibrillator pads. Diffuse interstitial changes identified, possibly components of edema. Apical pleural thickening on the right-greater-than-left. No obvious pneumothorax or effusion of the inferior costophrenic angles are clipped off the edge of the film. Surgical clips along the thoracic inlet. IMPRESSION: ET tube in place. Hyperinflation with enlarged cardiopericardial silhouette and interstitial edema. Electronically Signed   By: Jill Side M.D.   On: 01/13/2023 12:04    Procedures .Critical Care  Performed by: Kemper Durie, DO Authorized by: Kemper Durie, DO   Critical care provider statement:  Critical care time (minutes):  45   Critical care time was exclusive of:  Separately billable procedures and treating other patients   Critical care was necessary to treat or prevent imminent or life-threatening deterioration of the following conditions:  Cardiac failure, circulatory failure and trauma   Critical care was time spent personally by me on the following activities:  Development of treatment plan with patient or surrogate, discussions with consultants, discussions with primary provider, evaluation of patient's response to treatment, examination of patient, obtaining history from patient or surrogate, ordering and performing treatments and interventions, ordering and review of laboratory studies, ordering and review of radiographic studies, pulse oximetry, re-evaluation of patient's condition and review of old charts   I assumed direction of critical care for this  patient from another provider in my specialty: no     Care discussed with: admitting provider   Procedure Name: Intubation Date/Time: 01/13/2023 11:48 AM  Performed by: Kemper Durie, DOPre-anesthesia Checklist: Patient identified, Patient being monitored, Suction available and Emergency Drugs available Oxygen Delivery Method: Ambu bag Preoxygenation: Pre-oxygenation at 74% Ventilation: Two handed mask ventilation required Laryngoscope Size: Glidescope and 4 Grade View: Grade I Tube type: Non-subglottic suction tube Tube size: 7.5 mm Number of attempts: 1 Airway Equipment and Method: Rigid stylet Placement Confirmation: ETT inserted through vocal cords under direct vision, CO2 detector and Breath sounds checked- equal and bilateral Secured at: 23 cm Tube secured with: ETT holder Dental Injury: Teeth and Oropharynx as per pre-operative assessment         Medications Ordered in ED Medications  EPINEPHrine (ADRENALIN) 5 mg in NS 250 mL (0.02 mg/mL) premix infusion (20 mcg/min Intravenous Rate/Dose Change 01/13/23 1138)  fentaNYL 2520mg in NS 259m(102mml) infusion-PREMIX (100 mcg/hr Intravenous Infusion Verify 01/13/23 1332)  propofol (DIPRIVAN) 1000 MG/100ML infusion (20 mcg/kg/min  68.9 kg Intravenous New Bag/Given 01/13/23 1356)  norepinephrine (LEVOPHED) '4mg'$  in 250m69m.016 mg/mL) premix infusion (10 mcg/min Intravenous New Bag/Given 01/13/23 1337)  fentaNYL (SUBLIMAZE) 100 MCG/2ML injection (has no administration in time range)  heparin injection 5,000 Units (has no administration in time range)  pantoprazole (PROTONIX) injection 40 mg (has no administration in time range)  docusate (COLACE) 50 MG/5ML liquid 100 mg (has no administration in time range)  polyethylene glycol (MIRALAX / GLYCOLAX) packet 17 g (has no administration in time range)  insulin aspart (novoLOG) injection 0-9 Units (has no administration in time range)  fentaNYL (SUBLIMAZE) bolus via infusion  25-100 mcg (has no administration in time range)  Ampicillin-Sulbactam (UNASYN) 3 g in sodium chloride 0.9 % 100 mL IVPB (has no administration in time range)  EPINEPHrine (ADRENALIN) 1 MG/10ML injection (1 mg Intravenous Given 01/13/23 1127)  sodium chloride 0.9 % bolus 1,000 mL (0 mLs Intravenous Stopped 01/13/23 1313)  atropine injection 1 mg (1 mg Intravenous Given 01/13/23 1141)  EPINEPHrine (ADRENALIN) 1 MG/10ML injection (1 mg Intravenous Given 01/13/23 1206)  fentaNYL (SUBLIMAZE) injection (100 mcg Intravenous Given 01/13/23 1200)    ED Course/ Medical Decision Making/ A&P                             Medical Decision Making This patient presents to the ED with chief complaint(s) of fall, AMS with pertinent past medical history of CHF, a fib on Eliquis which further complicates the presenting complaint. The complaint involves an extensive differential diagnosis and also carries with it a high risk of complications and morbidity.    The  differential diagnosis includes ICH, mass effect, ACS, arrhythmia, anemia, electrolyte abnormality, respiratory failure, pneumothorax, other blunt thoracic, abdominal or pelvis injury  Additional history obtained: Additional history obtained from EMS  Records reviewed Primary Care Documents  ED Course and Reassessment: Patient was initially made a prehospital arrival STEMI alert.  EKG was evaluated by cardiology and STEMI alert was canceled.  Due to his fall on thinners he was made a level 1 trauma in the setting of his altered mental status and trauma was immediately present at bedside on patient's arrival.  The patient had agonal breathing and faint carotid pulses on initial arrival.  He was placed on the monitor and went to asystole.  CPR was initiated and the patient was given epinephrine.  Further IV access was obtained.  Please see nursing records for full CPR course.  The patient was initially on a systole on first pulse check and then we are able to  sustain ROSC after 3-4 rounds of CPR.  The patient was found to be in a sinus bradycardia.  He was placed on epi drip without significant improvement of his heart rate and blood pressures remained soft and initially started levo drip.  He was given 1 dose of atropine without any improvement of his bradycardia.  Cardiology did not recommend pacing or aggressive cardiac interventions at this time.  The patient underwent trauma evaluation.  Her workup showed a sternal fracture and rib fractures secondary to CPR but no other traumatic injuries.  Trauma recommended admission to medicine critical care and they consulted them for admission.  Patient will be admitted in critical condition.  I also spoke with the patient's son [phone]) who states that he would like his father to be full code at this time and wanted [place] to continue CPR and full resuscitation.  Independent labs interpretation:  The following labs were independently interpreted: lactic acidosis, hypercapneic and hypoxic respiratory and metabolic acidosis  Independent visualization of imaging: - I independently visualized the following imaging with scope of interpretation limited to determining acute life threatening conditions related to emergency care: CTH, C-spine, CAP, which revealed Sternal fracture, rib fractures, cardiomegaly, otherwise no other acute disease  Consultation: - Consulted or discussed management/test interpretation w/ external professional: cardiology, trauma, critical care  Consideration for admission or further workup: patient requires admission for his post-cardiac arrest care Social Determinants of health: N/A    Amount and/or Complexity of Data Reviewed Labs: ordered. Radiology: ordered.  Risk Prescription drug management. Decision regarding hospitalization.          Final Clinical Impression(s) / ED Diagnoses Final diagnoses:  Cardiac arrest [facility])    Rx / DC Orders ED Discharge Orders      None         Kemper Durie, DO 01/13/23 1411

## 2023-01-13 NOTE — Code Documentation (Signed)
CPR initiated at 1120 as pt was pulseless. Unresponsive at this time upon arrival in ED.

## 2023-01-13 NOTE — ED Triage Notes (Signed)
Found unresponsive at work between a radiator and wall. Blood and deformity to right side of head. Level 1 trauma activated.

## 2023-01-13 NOTE — Progress Notes (Signed)
Pharmacy Antibiotic Note  Curtis Ramos is a 89 y.o. male admitted on 01/13/2023 with  aspiration pneumonia .  Pharmacy has been consulted for Unasyn dosing. WBC 13.9.   Plan: Unasyn 3g IV q12h Monitor C&S, s/sx improvement, renal function, LOT      No data recorded.  Recent Labs  Lab 01/13/23 1209 01/13/23 1223  WBC  --  13.9*  CREATININE 1.79* 2.00*    Estimated Creatinine Clearance: 24.9 mL/min (A) (by C-G formula based on SCr of 2 mg/dL (H)).    No Known Allergies  Antimicrobials this admission: Unasyn 2/29 >>   Eliseo Gum, PharmD PGY1 Pharmacy Resident   01/13/2023  1:48 PM

## 2023-01-13 NOTE — Procedures (Signed)
Bronchoscopy Procedure Note  Curtis Ramos  [id number]  06-04-1932  Date:01/13/23  Time:4:05 PM   Provider Performing:Sonali Wivell   Procedure(s):  Flexible bronchoscopy with bronchial alveolar lavage [id number]) and Initial Therapeutic Aspiration of Tracheobronchial Tree [id number])  Indication(s) Acute respiratory failure  Consent Risks of the procedure as well as the alternatives and risks of each were explained to the patient and/or caregiver.  Consent for the procedure was obtained and is signed in the bedside chart  Anesthesia Propofol and Rocuronium   Time Out Verified patient identification, verified procedure, site/side was marked, verified correct patient position, special equipment/implants available, medications/allergies/relevant history reviewed, required imaging and test results available.   Sterile Technique Usual hand hygiene, masks, gowns, and gloves were used   Procedure Description Bronchoscope advanced through endotracheal tube and into airway.  Airways were examined down to subsegmental level with findings noted below.   Following diagnostic evaluation, BAL(s) performed in RLL with normal saline and return of Thick brown colored fluid and Transbronchial needle aspiration performed in RML/RLL/LLL  Findings: Copious amount of tenacious secretions noted through out the respiratory tree, foreign body noted in RLL, removed with broch suction   Complications/Tolerance None; patient tolerated the procedure well. Chest X-ray is needed post procedure.   EBL Minimal   Specimen(s) BAL

## 2023-01-13 NOTE — ED Notes (Signed)
Patient transported to CT 

## 2023-01-13 NOTE — Progress Notes (Signed)
ABG results given to Dr. Tacy Learn. Verbal order received to place pt on 7cc/kg Vt, decrease peep to 8 and to leave pt on 100% fio2 for the time being. RT will continue to monitor and be available as needed.

## 2023-01-13 NOTE — Procedures (Signed)
Central Venous Catheter Insertion Procedure Note  LOGUN CHINO  [id number]  1932/04/19  Date:01/13/23  Time:4:00 PM   Provider Performing:Kirklin Mcduffee   Procedure: Insertion of Non-tunneled Central Venous [phone]) with US guidance [id number])   Indication(s) Medication administration  Consent Risks of the procedure as well as the alternatives and risks of each were explained to the patient and/or caregiver.  Consent for the procedure was obtained and is signed in the bedside chart  Anesthesia Topical only with 1% lidocaine   Timeout Verified patient identification, verified procedure, site/side was marked, verified correct patient position, special equipment/implants available, medications/allergies/relevant history reviewed, required imaging and test results available.  Sterile Technique Maximal sterile technique including full sterile barrier drape, hand hygiene, sterile gown, sterile gloves, mask, hair covering, sterile ultrasound probe cover (if used).  Procedure Description Area of catheter insertion was cleaned with chlorhexidine and draped in sterile fashion.  With real-time ultrasound guidance a central venous catheter was placed into the left subclavian vein. Nonpulsatile blood flow and easy flushing noted in all ports.  The catheter was sutured in place and sterile dressing applied.  Complications/Tolerance None; patient tolerated the procedure well. Chest X-ray is ordered to verify placement for internal jugular or subclavian cannulation.   Chest x-ray is not ordered for femoral cannulation.  EBL Minimal  Specimen(s) None

## 2023-01-13 NOTE — Consult Note (Signed)
Advanced Heart Failure Team Consult Note   Primary Physician: Leonides Sake, MD HF-Cardiologist:  Dr Haroldine Laws  Reason for Consultation: Heart Failure   HPI:    Curtis Ramos is seen today for evaluation of heart failure at the request of Dr Tacy Learn.  nondisplaced transverse fracture of mid-lower sternal body, acute fx of costal cartilage of bilateral 2nd-6th ribs + 7th costal cartilage, acute fx of anterior 2nd-7th ribs; .   Echo 11/23 EF 20-25% LV markedly dialted + dyssynchrony - RV ok  Moderate MR/TR severe biatrial enlargement    Has been following with Dr. Agustin Cree. Symptomatically worse. Amiodarone started in anticipation at trial of DCCV for restoring NS and suppressing PVCs.    Referred to HF clinic and was seen by Dr Haroldine Laws 12/07/2022. Advanced HF in the setting of NICM. Suspect cardiomyopathy due to LBBB but in looking back at his ECG he has had very frequent PVCs and this may be contributing. He was recently been started on amiodarone for possible DC-CV of AF and now PVCs are suppressed. cMRI set up. 2/24: LBBB, LV EF 17%. Upper normal right ventricular size with RV EF 33%. Severe left atrial enlargement. No LGE.   Earlier today he EMS called after family found him falling to the ground. He struck his head and was stuck between the wall and radiator. Contusion noted on r side of his head. Trauma team called. In ED pulseless/apneic. Intubated. PEA arrest. CPR x 9 minutes with Epi x4 , ROSC.  No shockable rhythm. Pertinent admission labs included:  Hgb 14, INR 2,2, CO2 18, K 4.1, Creatinine 1.8, HS Trop 27>155, and BNP 1073. Lactic acid 7-->4.6.  CT chestAbd/pelvis--> nondisplaced transverse fracture of mid-lower sternal body, acute fx of costal cartilage of bilateral 2nd-6th ribs + 7th costal cartilage, acute fx of anterior 2nd-7th ribs. PCCM consulted.    Placed on pressures. Admitted to ICU.    Review of Systems: [y] = yes, '[ ]'$  = no Patient is encephalopathic and or  intubated. Therefore history has been obtained from chart review.    General: Weight gain '[ ]'$ ; Weight loss '[ ]'$ ; Anorexia '[ ]'$ ; Fatigue '[ ]'$ ; Fever '[ ]'$ ; Chills '[ ]'$ ; Weakness '[ ]'$   Cardiac: Chest pain/pressure '[ ]'$ ; Resting SOB '[ ]'$ ; Exertional SOB '[ ]'$ ; Orthopnea '[ ]'$ ; Pedal Edema '[ ]'$ ; Palpitations '[ ]'$ ; Syncope '[ ]'$ ; Presyncope '[ ]'$ ; Paroxysmal nocturnal dyspnea'[ ]'$   Pulmonary: Cough '[ ]'$ ; Wheezing'[ ]'$ ; Hemoptysis'[ ]'$ ; Sputum '[ ]'$ ; Snoring '[ ]'$   GI: Vomiting'[ ]'$ ; Dysphagia'[ ]'$ ; Melena'[ ]'$ ; Hematochezia '[ ]'$ ; Heartburn'[ ]'$ ; Abdominal pain '[ ]'$ ; Constipation '[ ]'$ ; Diarrhea '[ ]'$ ; BRBPR '[ ]'$   GU: Hematuria'[ ]'$ ; Dysuria '[ ]'$ ; Nocturia'[ ]'$   Vascular: Pain in legs with walking '[ ]'$ ; Pain in feet with lying flat '[ ]'$ ; Non-healing sores '[ ]'$ ; Stroke '[ ]'$ ; TIA '[ ]'$ ; Slurred speech '[ ]'$ ;  Neuro: Headaches'[ ]'$ ; Vertigo'[ ]'$ ; Seizures'[ ]'$ ; Paresthesias'[ ]'$ ;Blurred vision '[ ]'$ ; Diplopia '[ ]'$ ; Vision changes '[ ]'$   Ortho/Skin: Arthritis '[ ]'$ ; Joint pain '[ ]'$ ; Muscle pain '[ ]'$ ; Joint swelling '[ ]'$ ; Back Pain '[ ]'$ ; Rash '[ ]'$   Psych: Depression'[ ]'$ ; Anxiety'[ ]'$   Heme: Bleeding problems '[ ]'$ ; Clotting disorders '[ ]'$ ; Anemia '[ ]'$   Endocrine: Diabetes '[ ]'$ ; Thyroid dysfunction'[ ]'$   Home Medications Prior to Admission medications   Medication Sig Start Date End Date Taking? Authorizing Provider  amiodarone (PACERONE) 200 MG tablet Take 200 mg by mouth daily.  [provider]  carvedilol (COREG) 6.25 MG tablet Take 1 tablet (6.25 mg total) by mouth 2 (two) times daily. 12/31/22   Neilah Fulwider, Shaune Pascal, MD  dapagliflozin propanediol (FARXIGA) 10 MG TABS tablet Take 1 tablet (10 mg total) by mouth daily before breakfast. 10/18/22   Park Liter, MD  DOCUSATE SODIUM PO Take 1 tablet by mouth daily.    [provider]  ELIQUIS 5 MG TABS tablet TAKE 1 TABLET BY MOUTH TWICE A DAY 12/03/22   Park Liter, MD  furosemide (LASIX) 20 MG tablet Take 4 tablets (80 mg total) by mouth daily. 12/07/22   Braleigh Massoud, Shaune Pascal, MD  polyethylene glycol (MIRALAX /  GLYCOLAX) 17 g packet Take 17 g by mouth daily.    [provider]  sacubitril-valsartan (ENTRESTO) 49-51 MG Take 1 tablet by mouth 2 (two) times daily. 11/18/22   Park Liter, MD  SYNTHROID 175 MCG tablet Take 175 mcg by mouth daily. 05/16/22   [provider]    Past Medical History: Past Medical History:  Diagnosis Date   Acute on chronic combined systolic and diastolic CHF (congestive heart failure) ([place]) 03/19/2019   AKI (acute kidney injury) ([place]) 12/26/2014   Arthralgia 11/04/2014   Arthritis    "proabably"   Atypical mycobacterium infection    Cancer [facility])    family unaware of this hx on [id number]   Chronic systolic heart failure (HCC)    Decreased oral intake 12/26/2014   Decreased oral intake 12/26/2014   Depressed left ventricular ejection fraction 02/05/2020   Dilated cardiomyopathy ([place]) 03/19/2019   Ejection fraction 20 to 25% based on echocardiogram done by pulmonologist month ago.   DJD (degenerative joint disease) 10/07/2014   Dyspnea on exertion 03/19/2019   Essential hypertension 02/05/2020   GERD (gastroesophageal reflux disease)    Hammer toes of both feet 01/30/2018   History of thyroid cancer 12/16/2016   Hypercalcemia 12/26/2014   Hypothyroidism    Hypothyroidism associated with surgical procedure 10/07/2014   Lung mass    Myalgia 11/04/2014   Mycobacterium avium complex ([place])    Nail dystrophy 01/30/2018   Occult malignancy (HCC)    PAF (paroxysmal atrial fibrillation) ([place]) 02/05/2020   Persistent atrial fibrillation ([place]) 03/19/2019   Chads 2 Vascor equals 3   PONV (postoperative nausea and vomiting)    Pre-ulcerative calluses 01/30/2018   Pseudoaneurysm following procedure ([place]) 04/16/2020   Sensorineural hearing loss (SNHL), bilateral 08/11/2018   Shingles    Toenail fungus 01/30/2018   Wears glasses     Past Surgical History: Past Surgical History:  Procedure Laterality Date   APPENDECTOMY     CATARACT EXTRACTION W/ INTRAOCULAR LENS   IMPLANT, BILATERAL Bilateral    COLONOSCOPY     INGUINAL HERNIA REPAIR Left    LEFT HEART CATH AND CORONARY ANGIOGRAPHY N/A 03/11/2020   Procedure: LEFT HEART CATH AND CORONARY ANGIOGRAPHY;  Surgeon: Jettie Booze, MD;  Location: [place];  Service: Cardiovascular;  Laterality: N/A;   MASS EXCISION Right 08/27/2014   Procedure: EXCISION MASS RIGHT PALM/INDEX;  Surgeon: Leanora Cover, MD;  Location: [place];  Service: Orthopedics;  Laterality: Right;   THYROIDECTOMY  2003    Family History: History reviewed. No pertinent family history.  Social History: Social History   Socioeconomic History   Marital status: Married    Spouse name: Not on file   Number of children: Not on file   Years of education: Not on file  Highest education level: Not on file  Occupational History   Not on file  Tobacco Use   Smoking status: Never   Smokeless tobacco: Never  Substance and Sexual Activity   Alcohol use: No   Drug use: No   Sexual activity: Not on file  Other Topics Concern   Not on file  Social History Narrative   Not on file   Social Determinants of Health   Financial Resource Strain: Not on file  Food Insecurity: Not on file  Transportation Needs: Not on file  Physical Activity: Not on file  Stress: Not on file  Social Connections: Not on file    Allergies:  No Known Allergies  Objective:    Vital Signs:   Pulse Rate:  [35-141] 61 (02/29 1521) Resp:  [0-58] 28 (02/29 1521) BP: (62-164)/(13-91) 89/66 (02/29 1521) SpO2:  [43 %-100 %] 100 % (02/29 1521) Arterial Line BP: (79-89)/(47-53) 89/53 (02/29 1521) FiO2 (%):  [100 %] 100 % (02/29 1440)    Weight change: There were no vitals filed for this visit.  Intake/Output:   Intake/Output Summary (Last 24 hours) at 01/13/2023 1639 Last data filed at 01/13/2023 1500 Gross per 24 hour  Intake 1290.42 ml  Output --  Net 1290.42 ml      Physical Exam    General: Intubated HEENT:  Pupils nonreactive Neck: ETT. Supple. JVP difficult to assess.  Carotids 2+ bilat; no bruits. No lymphadenopathy or thyromegaly appreciated. Cor: PMI nondisplaced. Regular rate & rhythm. No rubs, gallops or murmurs. Zoll Pads in place  Lungs: Flail chest on R Abdomen: soft, nontender, nondistended. No hepatosplenomegaly. No bruits or masses. Good bowel sounds. Extremities: no cyanosis, clubbing, rash, edema Neuro: Intubated/sedated  Telemetry   Sinus Brady   EKG    SB with prolonged PR and PVC 40 bpm   Labs   Basic Metabolic Panel: Recent Labs  Lab 01/13/23 1209 01/13/23 1223 01/13/23 1341  NA 138 138 138  K 4.1 4.6 4.2  CL 106 107  --   CO2 18*  --   --   GLUCOSE 187* 158*  --   BUN 31* 41*  --   CREATININE 1.79* 2.00*  --   CALCIUM 8.3*  --   --     Liver Function Tests: Recent Labs  Lab 01/13/23 1209  AST 85*  ALT 48*  ALKPHOS 97  BILITOT 0.7  PROT 6.0*  ALBUMIN 3.1*   No results for input(s): "LIPASE", "AMYLASE" in the last 168 hours. No results for input(s): "AMMONIA" in the last 168 hours.  CBC: Recent Labs  Lab 01/13/23 1223 01/13/23 1341  WBC 13.9*  --   HGB 14.1  15.3 15.6  HCT 42.6  45.0 46.0  MCV 114.2*  --   PLT 169  --     Cardiac Enzymes: Recent Labs  Lab 01/13/23 1209  CKTOTAL 205    BNP: BNP (last 3 results) Recent Labs    12/07/22 1307 12/31/22 1156 01/13/23 1344  BNP >4,500.0* 1,669.4* 1,073.1*    ProBNP (last 3 results) Recent Labs    06/18/22 1331 10/05/22 0848  PROBNP 1,795* 3,434*     CBG: Recent Labs  Lab 01/13/23 1126 01/13/23 1449  GLUCAP 86 223*    Coagulation Studies: Recent Labs    01/13/23 1223  LABPROT 24.4*  INR 2.2*     Imaging   DG CHEST PORT 1 VIEW  Result Date: 01/13/2023 CLINICAL DATA:  Central line placement EXAM: PORTABLE CHEST 1 VIEW  COMPARISON:  Chest x-ray 01/13/2023 FINDINGS: Left-sided central venous catheter tip projects over the proximal SVC. Endotracheal tube tip  is proximally 4.5 cm above the carina. Enteric tube extends below the diaphragm. Surgical clips overlie the neck. Cardiomediastinal silhouette is stable, the heart is mildly enlarged. Bilateral hazy and patchy airspace opacities have not significantly changed. There are small pleural effusions. There is no pneumothorax. Osseous structures are stable. IMPRESSION: 1. Left-sided central venous catheter tip projects over the proximal SVC. 2. Stable bilateral hazy and patchy airspace opacities. Electronically Signed   By: Ronney Asters M.D.   On: 01/13/2023 16:36   DG Abd 1 View  Result Date: 01/13/2023 CLINICAL DATA:  NG tube placement. EXAM: ABDOMEN - 1 VIEW COMPARISON:  CT scan, same date. FINDINGS: The NG tube tip is in the body region of the stomach. The upper abdominal bowel gas pattern is unremarkable. IMPRESSION: NG tube tip is in the body region of the stomach. Electronically Signed   By: Marijo Sanes M.D.   On: 01/13/2023 16:33   CT CHEST ABDOMEN PELVIS WO CONTRAST  Result Date: 01/13/2023 CLINICAL DATA:  Patient found down, cardiopulmonary resuscitation EXAM: CT CHEST, ABDOMEN AND PELVIS WITHOUT CONTRAST TECHNIQUE: Multidetector CT imaging of the chest, abdomen and pelvis was performed following the standard protocol without IV contrast. RADIATION DOSE REDUCTION: This exam was performed according to the departmental dose-optimization program which includes automated exposure control, adjustment of the mA and/or kV according to patient size and/or use of iterative reconstruction technique. COMPARISON:  CT examinations from 06/12/2022 FINDINGS: CT CHEST FINDINGS Cardiovascular: Cardiomegaly noted. Atherosclerotic calcification of the thoracic aorta and branch vessels. Today's exam is performed without IV contrast and accordingly vascular patency is not assessed. Mediastinum/Nodes: Orogastric tube terminates at the gastroesophageal junction, consider advancing 10 cm. Nasogastric tube satisfactorily  positioned. Very small retrosternal hematoma as on image 33 series 2, associated with the nondisplaced transverse sternal fracture. Lungs/Pleura: Secondary pulmonary lobular interstitial thickening with scattered airspace opacities which have accentuated density dependently favoring acute pulmonary edema. More confluent airspace opacities are present in the lower lobes and posteriorly in the right upper lobe, and aspiration pneumonia is not excluded. Small areas of confluent nodularity for example in the right middle lobe on image 80 of series 4 and in the lingula on image 75 series 4, probably due to edema or infection although surveillance imaging will be recommended to exclude progression. No substantial pleural effusion. Musculoskeletal: In addition to more cephalad deformity in the sternal body from an old healed fracture, there is a transverse mid to lower sternal body fracture shown on image 64 series 6, extending in between the costosternal junctions of the third and fourth ribs. There are vertical fractures in the costal cartilage of the right second, third, fourth, fifth, sixth, and seventh ribs best appreciated on coronal images. There also fractures in the costal cartilage of the left anterior second, third, fourth, fifth, and sixth rib costal cartilage. In addition are fractures of the bony portions of the bilateral anterior second, third, fourth, fifth, sixth, and seventh ribs. Chondrocalcinosis in both glenohumeral joints. Thoracic spondylosis. CT ABDOMEN PELVIS FINDINGS Hepatobiliary: Trace perihepatic ascites. Reduced sensitivity for hepatic laceration due to streak artifact and lack of IV contrast. Pancreas: Unremarkable Spleen: Unremarkable Adrenals/Urinary Tract: Unremarkable Stomach/Bowel: Unremarkable Vascular/Lymphatic: Atherosclerosis is present, including aortoiliac atherosclerotic disease. Reproductive: Small bilateral hydroceles. Suspected epididymal cysts or spermatoceles bilaterally.  Other: No supplemental non-categorized findings. Musculoskeletal: Calcific tendinopathy proximally in both hamstring tendons. Stable grade 1 degenerative anterolisthesis  at L3-4 and L4-5. Chondrocalcinosis of the acetabular labrum bilaterally. IMPRESSION: 1. Acute nondisplaced transverse fracture of the mid to lower sternal body. 2. Acute fractures to the costal cartilage of the bilateral second through sixth ribs and also the right seventh costal cartilage. There are also acute bilateral fractures of the bony anterior portions of the bilateral second through seventh ribs. 3. Secondary pulmonary lobular interstitial thickening with scattered airspace opacities favoring acute pulmonary edema. More confluent airspace opacities are present in the lower lobes and posteriorly in the right upper lobe, and aspiration pneumonia is not excluded. 4. Trace perihepatic ascites. 5. Orogastric tube terminates at the gastroesophageal junction, consider advancing 10 cm. 6. Small bilateral hydroceles. Suspected epididymal cysts or spermatoceles bilaterally. 7. Chondrocalcinosis in both glenohumeral joints, acetabular labrum, and both hamstring tendons. 8. Aortic atherosclerosis. Aortic Atherosclerosis (ICD10-I70.0). Electronically Signed   By: Van Clines M.D.   On: 01/13/2023 12:37   CT CERVICAL SPINE WO CONTRAST  Result Date: 01/13/2023 CLINICAL DATA:  Patient found down between a radiator and a wall. Cardiopulmonary resuscitation. Right hand injury. EXAM: CT CERVICAL SPINE WITHOUT CONTRAST TECHNIQUE: Multidetector CT imaging of the cervical spine was performed without intravenous contrast. Multiplanar CT image reconstructions were also generated. RADIATION DOSE REDUCTION: This exam was performed according to the departmental dose-optimization program which includes automated exposure control, adjustment of the mA and/or kV according to patient size and/or use of iterative reconstruction technique. COMPARISON:  None  Available. FINDINGS: Alignment: 2 mm free fused anterolisthesis at C4-5 with interbody and bilateral facet fusion. Skull base and vertebrae: Substantial spurring at the anterior C1-2 articulation. Notable pannus posterior to the odontoid. No cervical spine fracture or acute bony findings. Soft tissues and spinal canal: Edema and airspace opacities at the lung bases. Orogastric tube and endotracheal tube noted. Bilateral carotid atherosclerotic calcification. Thyroidectomy. Disc levels: Uncinate and facet spurring cause mild right foraminal impingement at C5-6 and C6-7 along with left foraminal impingement at C3-4, C4-5, and C5-6. There is moderate central narrowing of the thecal sac at C3-4 due to chronic disc osteophyte complex and left paracentral disc protrusion. Upper chest: Please see dedicated chest CT report. Other: No supplemental non-categorized findings. IMPRESSION: 1. No acute cervical spine findings. 2. Cervical spondylosis and degenerative disc disease causing multilevel foraminal impingement. There is also moderate central narrowing of the thecal sac at C3-4 due to chronic disc osteophyte complex and left paracentral disc protrusion. 3. 2 mm free fused anterolisthesis at C4-5. 4. Edema and airspace opacities at the lung bases. 5. Bilateral carotid atherosclerotic calcification. Electronically Signed   By: Van Clines M.D.   On: 01/13/2023 12:23   CT HEAD WO CONTRAST  Result Date: 01/13/2023 CLINICAL DATA:  Head trauma. EXAM: CT HEAD WITHOUT CONTRAST TECHNIQUE: Contiguous axial images were obtained from the base of the skull through the vertex without intravenous contrast. RADIATION DOSE REDUCTION: This exam was performed according to the departmental dose-optimization program which includes automated exposure control, adjustment of the mA and/or kV according to patient size and/or use of iterative reconstruction technique. COMPARISON:  Head MRI 06/30/2017 FINDINGS: Brain: There is no  evidence of an acute infarct, intracranial hemorrhage, mass, midline shift, or extra-axial fluid collection. Mild cerebral atrophy is within normal limits for age. Cerebral white matter hypodensities are nonspecific but compatible with mild chronic small vessel ischemic disease. Vascular: Calcified atherosclerosis at the skull base. No hyperdense vessel. Skull: No acute fracture or suspicious osseous lesion. Sinuses/Orbits: Trace fluid in the right sphenoid sinus. Clear mastoid  air cells. Bilateral cataract extraction. Other: Small to moderate-sized right parietal scalp hematoma and laceration. IMPRESSION: 1. No evidence of acute intracranial abnormality. 2. Right parietal scalp hematoma. Electronically Signed   By: Logan Bores M.D.   On: 01/13/2023 12:22   DG Pelvis Portable  Result Date: 01/13/2023 CLINICAL DATA:  Possible trauma, found unresponsive at work EXAM: PORTABLE PELVIS 1-2 VIEWS COMPARISON:  None Available. FINDINGS: No displaced fracture or dislocation is seen. Bony spurs are noted in both hips. Scattered vascular calcifications are seen in soft tissues. Degenerative changes are noted in visualized lower lumbar spine. IMPRESSION: No displaced fracture or dislocation is seen. Electronically Signed   By: Elmer Picker M.D.   On: 01/13/2023 12:10   DG Chest Port 1 View  Result Date: 01/13/2023 CLINICAL DATA:  Pain after trauma EXAM: PORTABLE CHEST 1 VIEW COMPARISON:  X-ray 06/12/2022 FINDINGS: ET tube in place with tip 5 cm above the carina enlarged cardiopericardial silhouette. Overlapping cardiac leads and defibrillator pads. Diffuse interstitial changes identified, possibly components of edema. Apical pleural thickening on the right-greater-than-left. No obvious pneumothorax or effusion of the inferior costophrenic angles are clipped off the edge of the film. Surgical clips along the thoracic inlet. IMPRESSION: ET tube in place. Hyperinflation with enlarged cardiopericardial silhouette  and interstitial edema. Electronically Signed   By: Jill Side M.D.   On: 01/13/2023 12:04     Medications:     Current Medications:  Chlorhexidine Gluconate Cloth  6 each Topical Daily   docusate  100 mg Per Tube BID   fentaNYL       [START ON 01/14/2023] heparin  5,000 Units Subcutaneous Q8H   insulin aspart  0-9 Units Subcutaneous Q4H   levothyroxine  175 mcg Per Tube Daily   mouth rinse  15 mL Mouth Rinse Q2H   pantoprazole (PROTONIX) IV  40 mg Intravenous QHS   polyethylene glycol  17 g Per Tube Daily    Infusions:  sodium chloride 250 mL (01/13/23 1626)   ampicillin-sulbactam (UNASYN) IV 3 g (01/13/23 1627)   epinephrine 20 mcg/min (01/13/23 1540)   fentaNYL infusion INTRAVENOUS 150 mcg/hr (01/13/23 1500)   mannitol 20% IV solution 100 g (01/13/23 1625)   norepinephrine (LEVOPHED) Adult infusion     propofol (DIPRIVAN) infusion 20 mcg/kg/min (01/13/23 1500)      Assessment/Plan   1. Cardiac Arrest--> Shock  PEA arrest--> 9 min CPR 4 rounds epi. Unshockable rhythm.  Lactic acid 7>4.6 CO2 18 Rib fracture 2-7 ribs  Follow for prognostication.  - On Epi 20 mcg.   2. Acute Hypoxic Respiratory Failure Intubated  3. Fall -->scalp hematoma   4. A/C HFrEF, NICM -->Cardiogenic  Most recent [id number]   EF 20-25% LV markedly dilated + dyssynchrony - RV ok  Moderate MR/TR severe biatrial enlargement  - cMRI 2/24: LBBB, LV EF 17%. Upper normal right ventricular size with RV EF 33%. Severe left atrial enlargement. No LGE.  - Zio 2/24  AFL 100% avg rat 67 PVC 1.8%  5. AKI   Length of Stay: 0  Darrick Grinder, NP  01/13/2023, 4:39 PM  Advanced Heart Failure Team Pager [phone] (M-F; 7a - 5p)  Please contact [facility] for night-coverage after hours (4p -7a ) and weekends on [email or web address]  AGREE WITH ABOVE.   89 y/o male with severe NICM EF  17% likely due to LBBB CM (vs PVC mediated).   Admitted today with OOH cardiac arrest with unknown down time. Initial rhythm for  EMS was non-shockable  but suspect likely VT/VF at some point.   Now intubated/sedated on multiple pressors.   General:  Elderly. Ill-appearing man on vent HEENT:  + ETT ? Dilated right pupil Neck: supple. Cor: Flail chest R>L,. Irregular Lungs: + crackles Abdomen: soft, nontender, nondistended. No hepatosplenomegaly. No bruits or masses. Good bowel sounds. Extremities: no cyanosis, clubbing, rash, edema Neuro: unresponsive  Elderly male with severe NICM now s/p OOH cardiac arrest with long downtime. Arrest c/b flail chest with sternal/rib fractures, aspiration PNA, AKI  and probable anoxic brian injury. Prognosis extremely concerning. Will continue aggressive care for next 24-72 hours. Continue epi and levophed. Remains Full Code for now but have discussed not performing further CPR with family. Will readdress in am.   Case d/w CCM.   CRITICAL CARE Performed by: Glori Bickers  Total critical care time: 45 minutes  Critical care time was exclusive of separately billable procedures and treating other patients.  Critical care was necessary to treat or prevent imminent or life-threatening deterioration.  Critical care was time spent personally by me (independent of midlevel providers or residents) on the following activities: development of treatment plan with patient and/or surrogate as well as nursing, discussions with consultants, evaluation of patient's response to treatment, examination of patient, obtaining history from patient or surrogate, ordering and performing treatments and interventions, ordering and review of laboratory studies, ordering and review of radiographic studies, pulse oximetry and re-evaluation of patient's condition.  Glori Bickers, MD  10:30 PM

## 2023-01-14 ENCOUNTER — Inpatient Hospital Stay (HOSPITAL_COMMUNITY): Payer: Medicare Other

## 2023-01-14 DIAGNOSIS — E43 Unspecified severe protein-calorie malnutrition: Secondary | ICD-10-CM

## 2023-01-14 DIAGNOSIS — I469 Cardiac arrest, cause unspecified: Secondary | ICD-10-CM

## 2023-01-14 DIAGNOSIS — A419 Sepsis, unspecified organism: Secondary | ICD-10-CM | POA: Diagnosis not present

## 2023-01-14 DIAGNOSIS — J9601 Acute respiratory failure with hypoxia: Secondary | ICD-10-CM | POA: Diagnosis not present

## 2023-01-14 HISTORY — DX: Unspecified severe protein-calorie malnutrition: E43

## 2023-01-14 LAB — POCT I-STAT 7, (LYTES, BLD GAS, ICA,H+H)
Acid-base deficit: 5 mmol/L — ABNORMAL HIGH (ref 0.0–2.0)
Acid-base deficit: 6 mmol/L — ABNORMAL HIGH (ref 0.0–2.0)
Bicarbonate: 20 mmol/L (ref 20.0–28.0)
Bicarbonate: 19.4 mmol/L — ABNORMAL LOW (ref 20.0–28.0)
Calcium, Ion: 1.21 mmol/L (ref 1.15–1.40)
Calcium, Ion: 1.26 mmol/L (ref 1.15–1.40)
HCT: 46 % (ref 39.0–52.0)
HCT: 43 % (ref 39.0–52.0)
Hemoglobin: 15.6 g/dL (ref 13.0–17.0)
Hemoglobin: 14.6 g/dL (ref 13.0–17.0)
O2 Saturation: 100 %
O2 Saturation: 99 %
Patient temperature: 99
Patient temperature: 36.5
Potassium: 4 mmol/L (ref 3.5–5.1)
Potassium: 3.7 mmol/L (ref 3.5–5.1)
Sodium: 139 mmol/L (ref 135–145)
Sodium: 142 mmol/L (ref 135–145)
TCO2: 21 mmol/L — ABNORMAL LOW (ref 22–32)
TCO2: 20 mmol/L — ABNORMAL LOW (ref 22–32)
pCO2 arterial: 38.4 mmHg (ref 32–48)
pCO2 arterial: 35.7 mmHg (ref 32–48)
pH, Arterial: 7.325 — ABNORMAL LOW (ref 7.35–7.45)
pH, Arterial: 7.341 — ABNORMAL LOW (ref 7.35–7.45)
pO2, Arterial: 240 mmHg — ABNORMAL HIGH (ref 83–108)
pO2, Arterial: 146 mmHg — ABNORMAL HIGH (ref 83–108)

## 2023-01-14 LAB — GLUCOSE, CAPILLARY
Glucose-Capillary: 99 mg/dL (ref 70–99)
Glucose-Capillary: 114 mg/dL — ABNORMAL HIGH (ref 70–99)
Glucose-Capillary: 94 mg/dL (ref 70–99)
Glucose-Capillary: 110 mg/dL — ABNORMAL HIGH (ref 70–99)
Glucose-Capillary: 86 mg/dL (ref 70–99)
Glucose-Capillary: 80 mg/dL (ref 70–99)

## 2023-01-14 LAB — CBC
HCT: 47.6 % (ref 39.0–52.0)
Hemoglobin: 15.9 g/dL (ref 13.0–17.0)
MCH: 36.1 pg — ABNORMAL HIGH (ref 26.0–34.0)
MCHC: 33.4 g/dL (ref 30.0–36.0)
MCV: 108.2 fL — ABNORMAL HIGH (ref 80.0–100.0)
Platelets: 205 10*3/uL (ref 150–400)
RBC: 4.4 MIL/uL (ref 4.22–5.81)
RDW: 14.5 % (ref 11.5–15.5)
WBC: 15.9 10*3/uL — ABNORMAL HIGH (ref 4.0–10.5)
nRBC: 0 % (ref 0.0–0.2)

## 2023-01-14 LAB — ECHOCARDIOGRAM COMPLETE
AR max vel: 2.71 cm2
AV Area VTI: 2.57 cm2
AV Area mean vel: 2.5 cm2
AV Mean grad: 3.5 mmHg
AV Peak grad: 7.3 mmHg
Ao pk vel: 1.36 m/s
Area-P 1/2: 2.97 cm2
Calc EF: 19.9 %
Est EF: 20
Height: 71 in
P 1/2 time: 844 msec
S' Lateral: 4.9 cm
Single Plane A2C EF: 21.7 %
Single Plane A4C EF: 27 %
Weight: 2201.07 oz

## 2023-01-14 LAB — COMPREHENSIVE METABOLIC PANEL
ALT: 228 U/L — ABNORMAL HIGH (ref 0–44)
AST: 191 U/L — ABNORMAL HIGH (ref 15–41)
Albumin: 3.1 g/dL — ABNORMAL LOW (ref 3.5–5.0)
Alkaline Phosphatase: 111 U/L (ref 38–126)
Anion gap: 15 (ref 5–15)
BUN: 37 mg/dL — ABNORMAL HIGH (ref 8–23)
CO2: 18 mmol/L — ABNORMAL LOW (ref 22–32)
Calcium: 8.8 mg/dL — ABNORMAL LOW (ref 8.9–10.3)
Chloride: 104 mmol/L (ref 98–111)
Creatinine, Ser: 2.16 mg/dL — ABNORMAL HIGH (ref 0.61–1.24)
GFR, Estimated: 29 mL/min — ABNORMAL LOW (ref >60)
Glucose, Bld: 135 mg/dL — ABNORMAL HIGH (ref 70–99)
Potassium: 3.9 mmol/L (ref 3.5–5.1)
Sodium: 137 mmol/L (ref 135–145)
Total Bilirubin: 1.2 mg/dL (ref 0.3–1.2)
Total Protein: 6.2 g/dL — ABNORMAL LOW (ref 6.5–8.1)

## 2023-01-14 LAB — COOXEMETRY PANEL
Carboxyhemoglobin: 1.1 % (ref 0.5–1.5)
Carboxyhemoglobin: 1.7 % — ABNORMAL HIGH (ref 0.5–1.5)
Carboxyhemoglobin: 1 % (ref 0.5–1.5)
Methemoglobin: 0.8 % (ref 0.0–1.5)
Methemoglobin: <0.7 % (ref 0.0–1.5)
Methemoglobin: 0.7 % (ref 0.0–1.5)
O2 Saturation: 99.9 %
O2 Saturation: 99.7 %
O2 Saturation: 98.6 %
Total hemoglobin: 15.3 g/dL (ref 12.0–16.0)
Total hemoglobin: 14.4 g/dL (ref 12.0–16.0)
Total hemoglobin: 15 g/dL (ref 12.0–16.0)

## 2023-01-14 LAB — HEMOGLOBIN A1C
Hgb A1c MFr Bld: 5.9 % — ABNORMAL HIGH (ref 4.8–5.6)
Mean Plasma Glucose: 123 mg/dL

## 2023-01-14 LAB — LACTIC ACID, PLASMA
Lactic Acid, Venous: 3.6 mmol/L (ref 0.5–1.9)
Lactic Acid, Venous: 5 mmol/L (ref 0.5–1.9)

## 2023-01-14 LAB — TRIGLYCERIDES: Triglycerides: 48 mg/dL (ref <150)

## 2023-01-14 LAB — MAGNESIUM
Magnesium: 2.2 mg/dL (ref 1.7–2.4)
Magnesium: 2.2 mg/dL (ref 1.7–2.4)

## 2023-01-14 LAB — PHOSPHORUS
Phosphorus: 3.8 mg/dL (ref 2.5–4.6)
Phosphorus: 3.3 mg/dL (ref 2.5–4.6)

## 2023-01-14 MED ORDER — DEXMEDETOMIDINE HCL IN NACL 400 MCG/100ML IV SOLN
0.0000 ug/kg/h | INTRAVENOUS | Status: DC
  Administered 2023-01-14: 0.2 ug/kg/h via INTRAVENOUS
  Administered 2023-01-14 – 2023-01-15 (×2): 0.8 ug/kg/h via INTRAVENOUS
  Filled 2023-01-14 (×5): qty 100

## 2023-01-14 MED ORDER — PROSOURCE TF20 ENFIT COMPATIBL EN LIQD
60.0000 mL | Freq: Every day | ENTERAL | Status: DC
  Administered 2023-01-14 – 2023-01-31 (×18): 60 mL
  Filled 2023-01-14 (×18): qty 60

## 2023-01-14 MED ORDER — MIDAZOLAM HCL 2 MG/2ML IJ SOLN
2.0000 mg | Freq: Once | INTRAMUSCULAR | Status: AC
  Administered 2023-01-14: 2 mg via INTRAVENOUS
  Filled 2023-01-14: qty 2

## 2023-01-14 MED ORDER — LACTATED RINGERS IV BOLUS
500.0000 mL | Freq: Once | INTRAVENOUS | Status: AC
  Administered 2023-01-14: 500 mL via INTRAVENOUS

## 2023-01-14 MED ORDER — OSMOLITE 1.5 CAL PO LIQD
1000.0000 mL | ORAL | Status: DC
  Administered 2023-01-14 – 2023-01-23 (×10): 1000 mL
  Filled 2023-01-14 (×14): qty 1000

## 2023-01-14 MED ORDER — THIAMINE MONONITRATE 100 MG PO TABS
100.0000 mg | ORAL_TABLET | Freq: Every day | ORAL | Status: DC
  Administered 2023-01-14 – 2023-01-20 (×7): 100 mg
  Filled 2023-01-14 (×7): qty 1

## 2023-01-14 MED ORDER — PERFLUTREN LIPID MICROSPHERE
1.0000 mL | INTRAVENOUS | Status: AC | PRN
  Administered 2023-01-14: 6 mL via INTRAVENOUS

## 2023-01-14 NOTE — Progress Notes (Signed)
NAME:  KENNIE GARINO, MRN:  [id number], DOB:  September 08, 1932, LOS: 1 ADMISSION DATE:  01/13/2023, CONSULTATION DATE:  01/13/2023 REFERRING MD:  Trauma, CHIEF COMPLAINT:  Cardiac arrest, post-fall (Level 1 Trauma)   History of Present Illness:  89 year old man who presented to [facility] ED 2/29 via EMS after unwitnessed fall at work. PMHx significant for HTN, HFrEF with NICM (cMRI 12/2022 with severe LV dilation, EF 17%, LBBB, followed by Dr. Haroldine Laws), AFib (on Eliquis), MAI infection, GERD, hypothyroidism, history of thyroid CA.   History obtained from chart review and from family. Patient presented to [facility] ED via EMS after a fall at work; per family, patient did not have any complaints prior to fall. Recently undergoing workup with Cardiology for pacemaker placement. He was found down between a radiator and the wall, unresponsive with contusion and deformities to R side of his head. Initially with tachycardic with EMS, then bradycardic. ST elevation noted on EKG and Code STEMI was called, later called off as ST elevation had resolved. Patient lost pulses while in ED and became apneic with cardiac monitoring showing PEA arrest. CPR administered x 9 minutes with Epi x 4 and ROSC, patient never had a shockable rhythm. Intubated peri-arrest. Cards was consulted, noting high risk of VT/VF in the setting of severe CM. AHF was also notified of admission.   Labs demonstrated WBC 13.9, Hgb 14.1, Plt 169. INR 2.2 (on Eliquis). Na 138, K 4.1, CO2 18, Cr 1.79 (baseline ~1.3). CK 205. LA 7.0 > 4.6. Trop 27 > 155. BNP 1073. UA with glucose > 500, +protein. PCT < 0.10. CT Head NAICA, R parietal scalp hematoma. CT C-spine negative for acute findings. CT Chest/A/P with nondisplaced transverse fracture of mid-lower sternal body, acute fx of costal cartilage of bilateral 2nd-6th ribs + 7th costal cartilage, acute fx of anterior 2nd-7th ribs; pulmonary lobular interstitial thickening (?pulm edema), BLL opacities, trace ascites.  Empiric Unasyn started for ?aspiration.   PCCM was consulted for ICU admission. Pertinent  Medical History   Past Medical History:  Diagnosis Date   Acute on chronic combined systolic and diastolic CHF (congestive heart failure) ([place]) 03/19/2019   AKI (acute kidney injury) ([place]) 12/26/2014   Arthralgia 11/04/2014   Arthritis    "proabably"   Atypical mycobacterium infection    Cancer [facility])    family unaware of this hx on [id number]   Chronic systolic heart failure (HCC)    Decreased oral intake 12/26/2014   Decreased oral intake 12/26/2014   Depressed left ventricular ejection fraction 02/05/2020   Dilated cardiomyopathy ([place]) 03/19/2019   Ejection fraction 20 to 25% based on echocardiogram done by pulmonologist month ago.   DJD (degenerative joint disease) 10/07/2014   Dyspnea on exertion 03/19/2019   Essential hypertension 02/05/2020   GERD (gastroesophageal reflux disease)    Hammer toes of both feet 01/30/2018   History of thyroid cancer 12/16/2016   Hypercalcemia 12/26/2014   Hypothyroidism    Hypothyroidism associated with surgical procedure 10/07/2014   Lung mass    Myalgia 11/04/2014   Mycobacterium avium complex ([place])    Nail dystrophy 01/30/2018   Occult malignancy (HCC)    PAF (paroxysmal atrial fibrillation) ([place]) 02/05/2020   Persistent atrial fibrillation ([place]) 03/19/2019   Chads 2 Vascor equals 3   PONV (postoperative nausea and vomiting)    Pre-ulcerative calluses 01/30/2018   Pseudoaneurysm following procedure ([place]) 04/16/2020   Sensorineural hearing loss (SNHL), bilateral 08/11/2018   Shingles    Toenail fungus 01/30/2018   Wears  glasses    Significant Hospital Events: Including procedures, antibiotic start and stop dates in addition to other pertinent events   2/29 - Presented to [facility] ED after 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 Found between radiator and wall with R-sided head injury. Suspected cardiac etiology of unresponsiveness. On ED arrival,  became pulseless (rhythm PEA). CPR x 9 min, Epi x 4 with ROSC. Intubated peri-arrest. CT Head, C-Spine, Chest/A/P completed (as above). Unasyn for ?aspiration. R axillary A-line, L subclavian CVC, ETT tube exchange + bronch.   Interim History / Subjective:  Patient remains critically ill but we are slowly titrating down vasopressor requirements and he is more interactive. Prognosis remains guarded.  Objective   Blood pressure (!) 102/59, pulse 66, temperature 98.2 F (36.8 C), resp. rate (!) 32, height '5\' 11"'$  (1.803 m), weight 62.4 kg, SpO2 100 %. CVP:  [14 mmHg] 14 mmHg  Vent Mode: PRVC FiO2 (%):  [70 %-100 %] (P) 70 % Set Rate:  [15 bmp-32 bmp] 32 bmp Vt Set:  [500 mL-600 mL] 520 mL PEEP:  [8 cmH20-12 cmH20] 8 cmH20 Plateau Pressure:  [19 cmH20-28 cmH20] 19 cmH20   Intake/Output Summary (Last 24 hours) at 01/14/2023 0725 Last data filed at 01/14/2023 0600 Gross per 24 hour  Intake 3689.74 ml  Output 2350 ml  Net 1339.74 ml   Filed Weights   01/14/23 0500  Weight: 62.4 kg   Examination: Constitutional:Ill-appearing elderly gentleman. In no acute distress. Cardio:Regular rate with irregular rhythm. No murmurs, rubs, or gallops. Pulm:Clear to auscultation bilaterally. On full ventilatory support. Abdomen:Soft, tender to palpation, non-distended. [id number] for extremity edema. Skin:Warm and dry. Neuro:Opens eyes to voice. Once awakened, has tremor diffusely. No cough/gag reflex. Does not follow commands.  Resolved Hospital Problem list   N/A  Assessment & Plan:  Post-PEA arrest Shock, likely septic Lactic acidosis Patient remains intubated, on vasopressor support with epinephrine and norepinephrine. MAP have been >65 with this regimen. Co-ox panel with O2 saturation 79.1, carboxyhemoglobin 1.5%, methemoglobin <0.7%. His extremities are warm and given co-ox results the etiology of his shock is more consistent with due to sepsis. Lactic acid trending upwards, 2.9>3.6.    Plan: -Continue normothermia protocol -Titrate norepinephrine and epinephrine as tolerated; MAP goal >65 -LR blous, 500 cc -Cautious IVF resuscitation if needed -Trend WBC, fever curve, LA, co-ox    Severe nonischemic cardiomyopathy HFrEF, LF EV 17% Atrial fibrillation cMRI 12/2022 with severe LV dilation, EF 17%, LBBB. Home regimen is carvedilol, farxiga, entresto, lasix. He is on Eliquis and amiodarone for atrial fibrillation. Cardiology has assessed patient and are concerned about his prognosis and agree with aggressive care for next 24-72h. Echocardiogram pending. Plan: -Advanced HF team following -F/u echocardiogram -Continue cardiac monitoring, CVP -Continue to hold amiodarone in setting of regular rate -Continue to hold GDMT in setting of shock   Acute hypoxemic respiratory failure Concern for aspiration PNA History of MAI infection Most recent ABG with pH 7.325/pCO2 38.4/pO2 240/bicarbonate 20/TCO2 21. Working with RT to adjust settings based on ABG results. Respiratory gram stain with few GPC in pairs and chains. Leukocytosis 13.9>15.9. He was noted to have bilateral hazy and patchy airspace opacities on CXR at admission and there were debris in his lungs that were removed during bronchoscopy after intubation, concerning for aspiration. Plan: -Continue unasyn for aspiration pneumonia -Continue full ventilator support; wean FiO2 for O2 sat > 90% -Daily SBT as tolerated -VAP bundle -Pulmonary hygiene -PAD protocol for sedation: propofol and fentanyl for goal  RASS -1 to -2 -Follow CXR -Follow respiratory culture   Acute encephalopathy, multifactorial S/p mannitol 20% x1 dose for questionable cerebral edema on admission. Overnight when sedation was weaned he was responsive to voice and able to follow some commands but became restless and dyssynchronous with the ventilator, so sedation was restarted. Today, he wakes to voice and responds to pain but does not follow commands. He  has a tremor when woken, may be startle response but unclear. Plan: -Long-term EEG -Frequent neuro checks -Low threshold for repeat CT if changes in neuro exam -MRI Brain 72h post-arrest--03/03 -Neuroprotective measures: HOB > 30 degrees, normoglycemia, normothermia, electrolytes WNL   Elevated liver enzymes, c/f shock liver AST/ELT 191/228 (85/48), total bilirubin is normal. I suspect this is shock liver in setting of PEA arrest and hemodynamic instability. Plan: -Trend CMP  AKI on CKD stage 2 Concern for ATN in setting of PEA arrest and hemodynamic instability. Renal function worsened further today, BUN/Cr 37/2.16 with GFR 29 from BUN/Cr 31/1.79 and GFR 36 on admission. From a hemodynamics standpoint he has been stable though requiring vasopressor support and IVF resuscitation must be done cautiously given severity of his HFrEF. Fortunately he is making urine still. Plan: -Trend CMP -Monitor I's/O's -Avoid nephrotoxic agents   Hypothyroidism TSH 29.480, T4 1.26. Patient is acute ill which may be cause of these abnormalities. Will need to monitor thyroid function periodically as patient recovers from cardiac arrest to ensure no changes in levothyroxine dosing are needed. Plan: -Continue home levothyroxine 175 mcg daily per tube   At risk for hyperglycemia Nutrition needs Plan: -CorTrak placement today -SSI, CBG monitoring; goal CBG 140-180  Best Practice (right click and "Reselect all SmartList Selections" daily)   Diet/type: tubefeeds DVT prophylaxis: prophylactic heparin  GI prophylaxis: PPI Lines: Central line and Arterial Line Foley:  Yes, and it is still needed Code Status:  full code Last date of multidisciplinary goals of care discussion [02/29]  Labs   CBC: Recent Labs  Lab 01/13/23 1223 01/13/23 1341 01/13/23 1639 01/14/23 0333 01/14/23 0432  WBC 13.9*  --   --  15.9*  --   HGB 14.1  15.3 15.6 14.6 15.9 15.6  HCT 42.6  45.0 46.0 43.0 47.6 46.0  MCV  114.2*  --   --  108.2*  --   PLT 169  --   --  205  --     Basic Metabolic Panel: Recent Labs  Lab 01/13/23 1209 01/13/23 1223 01/13/23 1341 01/13/23 1639 01/14/23 0333 01/14/23 0432  NA 138 138 138 132* 137 139  K 4.1 4.6 4.2 3.5 3.9 4.0  CL 106 107  --   --  104  --   CO2 18*  --   --   --  18*  --   GLUCOSE 187* 158*  --   --  135*  --   BUN 31* 41*  --   --  37*  --   CREATININE 1.79* 2.00*  --   --  2.16*  --   CALCIUM 8.3*  --   --   --  8.8*  --   MG  --   --   --   --  2.2  --   PHOS  --   --   --   --  3.8  --    GFR: Estimated Creatinine Clearance: 20.9 mL/min (A) (by C-G formula based on SCr of 2.16 mg/dL (H)). Recent Labs  Lab 01/13/23 1223 01/13/23 1344 01/13/23  1635 01/14/23 0333  PROCALCITON  --  <0.10  --   --   WBC 13.9*  --   --  15.9*  LATICACIDVEN 7.0* 4.6* 2.9*  --     Liver Function Tests: Recent Labs  Lab 01/13/23 1209 01/14/23 0333  AST 85* 191*  ALT 48* 228*  ALKPHOS 97 111  BILITOT 0.7 1.2  PROT 6.0* 6.2*  ALBUMIN 3.1* 3.1*   No results for input(s): "LIPASE", "AMYLASE" in the last 168 hours. No results for input(s): "AMMONIA" in the last 168 hours.  ABG    Component Value Date/Time   PHART 7.325 (L) 01/14/2023 0432   PCO2ART 38.4 01/14/2023 0432   PO2ART 240 (H) 01/14/2023 0432   HCO3 20.0 01/14/2023 0432   TCO2 21 (L) 01/14/2023 0432   ACIDBASEDEF 5.0 (H) 01/14/2023 0432   O2SAT 100 01/14/2023 0432     Coagulation Profile: Recent Labs  Lab 01/13/23 1223  INR 2.2*    Cardiac Enzymes: Recent Labs  Lab 01/13/23 1209  CKTOTAL 205    HbA1C: Hgb A1c MFr Bld  Date/Time Value Ref Range Status  01/13/2023 12:23 PM 5.9 (H) 4.8 - 5.6 % Final    Comment:    (NOTE)         Prediabetes: 5.7 - 6.4         Diabetes: >6.4         Glycemic control for adults with diabetes: <7.0     CBG: Recent Labs  Lab 01/13/23 1126 01/13/23 1449 01/13/23 1912 01/13/23 2307 01/14/23 0304  GLUCAP 86 223* 128* 114* 99     Past Medical History:  He,  has a past medical history of Acute on chronic combined systolic and diastolic CHF (congestive heart failure) ([place]) (03/19/2019), AKI (acute kidney injury) ([place]) (12/26/2014), Arthralgia (11/04/2014), Arthritis, Atypical mycobacterium infection, Cancer ([place]), Chronic systolic heart failure ([place]), Decreased oral intake (12/26/2014), Decreased oral intake (12/26/2014), Depressed left ventricular ejection fraction (02/05/2020), Dilated cardiomyopathy ([place]) (03/19/2019), DJD (degenerative joint disease) (10/07/2014), Dyspnea on exertion (03/19/2019), Essential hypertension (02/05/2020), GERD (gastroesophageal reflux disease), Hammer toes of both feet (01/30/2018), History of thyroid cancer (12/16/2016), Hypercalcemia (12/26/2014), Hypothyroidism, Hypothyroidism associated with surgical procedure (10/07/2014), Lung mass, Myalgia (11/04/2014), Mycobacterium avium complex ([place]), Nail dystrophy (01/30/2018), Occult malignancy ([place]), PAF (paroxysmal atrial fibrillation) ([place]) (02/05/2020), Persistent atrial fibrillation ([place]) (03/19/2019), PONV (postoperative nausea and vomiting), Pre-ulcerative calluses (01/30/2018), Pseudoaneurysm following procedure ([place]) (04/16/2020), Sensorineural hearing loss (SNHL), bilateral (08/11/2018), Shingles, Toenail fungus (01/30/2018), and Wears glasses.   Surgical History:   Past Surgical History:  Procedure Laterality Date   APPENDECTOMY     CATARACT EXTRACTION W/ INTRAOCULAR LENS  IMPLANT, BILATERAL Bilateral    COLONOSCOPY     INGUINAL HERNIA REPAIR Left    LEFT HEART CATH AND CORONARY ANGIOGRAPHY N/A 03/11/2020   Procedure: LEFT HEART CATH AND CORONARY ANGIOGRAPHY;  Surgeon: Jettie Booze, MD;  Location: [facility];  Service: Cardiovascular;  Laterality: N/A;   MASS EXCISION Right 08/27/2014   Procedure: EXCISION MASS RIGHT PALM/INDEX;  Surgeon: Leanora Cover, MD;  Location: [place];  Service: Orthopedics;  Laterality: Right;    THYROIDECTOMY  2003     Social History:   reports that he has never smoked. He has never used smokeless tobacco. He reports that he does not drink alcohol and does not use drugs.   Family History:  His family history is not on file.   Allergies No Known Allergies   Home Medications  Prior to Admission  medications   Medication Sig Start Date End Date Taking? Authorizing Provider  amiodarone (PACERONE) 200 MG tablet Take 200 mg by mouth daily.    [provider]  carvedilol (COREG) 6.25 MG tablet Take 1 tablet (6.25 mg total) by mouth 2 (two) times daily. 12/31/22   Bensimhon, Shaune Pascal, MD  dapagliflozin propanediol (FARXIGA) 10 MG TABS tablet Take 1 tablet (10 mg total) by mouth daily before breakfast. 10/18/22   Park Liter, MD  DOCUSATE SODIUM PO Take 1 tablet by mouth daily.    [provider]  ELIQUIS 5 MG TABS tablet TAKE 1 TABLET BY MOUTH TWICE A DAY 12/03/22   Park Liter, MD  furosemide (LASIX) 20 MG tablet Take 4 tablets (80 mg total) by mouth daily. 12/07/22   Bensimhon, Shaune Pascal, MD  polyethylene glycol (MIRALAX / GLYCOLAX) 17 g packet Take 17 g by mouth daily.    [provider]  sacubitril-valsartan (ENTRESTO) 49-51 MG Take 1 tablet by mouth 2 (two) times daily. 11/18/22   Park Liter, MD  SYNTHROID 175 MCG tablet Take 175 mcg by mouth daily. 05/16/22   [provider]     Critical care time: 35 minutes    Farrel Gordon, DO Internal Medicine PGY-2 [phone]

## 2023-01-14 NOTE — Progress Notes (Addendum)
Initial Nutrition Assessment  DOCUMENTATION CODES:  Severe malnutrition in context of chronic illness  INTERVENTION:  Once cortrak in place, recommend the following: Osmolite 1.5 at 55 ml/h (1320 ml per day) Start at 9m/h and advance by 120mq8h to goal Prosource TF20 60 ml 1x/d Provides 2060 kcal, 103 gm protein, 1006 ml free water daily Thiamine '100mg'$  x 5 days Monitor Mg and phosphorus x 4 occurences and MD to replace as needed   NUTRITION DIAGNOSIS:  Severe Malnutrition related to chronic illness (CHF) as evidenced by severe fat depletion, severe muscle depletion.  GOAL:  Patient will meet greater than or equal to 90% of their needs  MONITOR:  Labs, Vent status, TF tolerance  REASON FOR ASSESSMENT:  Ventilator, Consult Enteral/tube feeding initiation and management  ASSESSMENT:  Pt with hx of CHF, HTN, GERD, and PAF presented to ED after being found down with a contusion and deformities to the right side of the head. Suffered PEA arrest in ED and CPR x 9 minutes.   2/29 - intubated, bronchoscopy 3/1 - cortrak placement pending  Patient is currently intubated on ventilator support. OGT in place noted to be in the gastric body. No family present at the time of visit. Pt only lightly sedated on vent and did get slightly agitated during exam.   Pt very thin and with severe muscle fat deficits on exam suggestive of long-term undernourishment. Pt is requiring pressor support x 2 but MAP WNL and rates of pressors stable/decreasing over the last ~12 hours.   Discussed with attending, OGT to be exchanged for cortrak and feeds to be initiated. Based on malnutrition, pt is at risk of refeeding. Will add thiamine x 5 days and monitor Mg and Phosphorus x 4 occurences. MD to replace if low.  MV: 17 L/min Temp (24hrs), Avg:97.6 F (36.4 C), Min:93.9 F (34.4 C), Max:99.9 F (37.7 C)  Propofol: 6.2 ml/hr (164kcal/d)   Intake/Output Summary (Last 24 hours) at 01/14/2023 1312 Last  data filed at 01/14/2023 1100 Gross per 24 hour  Intake 4670.32 ml  Output 2350 ml  Net 2320.32 ml  Net IO Since Admission: 2,320.32 mL [01/14/23 1312]  Nutritionally Relevant Medications: Scheduled Meds:  docusate  100 mg Per Tube BID   insulin aspart  0-9 Units Subcutaneous Q4H   pantoprazole IV  40 mg Intravenous QHS   polyethylene glycol  17 g Per Tube Daily   Continuous Infusions:  ampicillin-sulbactam (UNASYN) IV Stopped (01/14/23 [id number]  epinephrine 20 mcg/min (01/14/23 0600)   fentaNYL infusion INTRAVENOUS 100 mcg/hr (01/14/23 0600)   norepinephrine (LEVOPHED) Adult infusion 10 mcg/min (01/14/23 0600)   propofol (DIPRIVAN) infusion 15 mcg/kg/min (01/14/23 0600)   Labs Reviewed: BUN 37, creatinine 2.16 CBG ranges from 86-223 mg/dL over the last 24 hours HgbA1c 5.9% (2/29)  NUTRITION - FOCUSED PHYSICAL EXAM: Flowsheet Row Most Recent Value  Orbital Region Severe depletion  Upper Arm Region Severe depletion  Thoracic and Lumbar Region Moderate depletion  Buccal Region Severe depletion  Temple Region Severe depletion  Clavicle Bone Region Moderate depletion  Clavicle and Acromion Bone Region Severe depletion  Scapular Bone Region Moderate depletion  Dorsal Hand Unable to assess  Patellar Region Severe depletion  Anterior Thigh Region Severe depletion  Posterior Calf Region Severe depletion  Edema (RD Assessment) None  Hair Reviewed  Eyes Reviewed  Mouth Reviewed  Skin Reviewed  Nails Reviewed   Diet Order:   Diet Order  Diet NPO time specified  Diet effective now                   EDUCATION NEEDS:  Not appropriate for education at this time  Skin:  Skin Assessment: Reviewed RN Assessment  Last BM:  unsure  Height:  Ht Readings from Last 1 Encounters:  01/13/23 '5\' 11"'$  (1.803 m)    Weight:  Wt Readings from Last 1 Encounters:  01/14/23 62.4 kg    Ideal Body Weight:  78.2 kg  BMI:  Body mass index is 19.19 kg/m.  Estimated  Nutritional Needs:  Kcal:  1900-2100 kcal/d Protein:  95-105g/d Fluid:  1.8-2L/d    Ranell Patrick, RD, LDN Clinical Dietitian RD pager # available in AMION  After hours/weekend pager # available in [facility]

## 2023-01-14 NOTE — Plan of Care (Signed)

## 2023-01-14 NOTE — Progress Notes (Signed)
Attempted Echocardiogram, EEG exam in progress, will attempt later.

## 2023-01-14 NOTE — Progress Notes (Signed)
Patient ID: Curtis Ramos, male   DOB: [birth year], 89 y.o.   MRN: [id number]      Subjective: On vent ROS negative except as listed above. Objective: Vital signs in last 24 hours: Temp:  [93.9 F (34.4 C)-99.9 F (37.7 C)] 98.2 F (36.8 C) (03/01 0815) Pulse Rate:  [35-141] 65 (03/01 0815) Resp:  [0-58] 32 (03/01 0815) BP: (62-164)/(13-91) 116/62 (03/01 0800) SpO2:  [43 %-100 %] 100 % (03/01 0815) Arterial Line BP: (76-147)/(47-76) 106/53 (03/01 0815) FiO2 (%):  [40 %-100 %] 40 % (03/01 0800) Weight:  [62.4 kg] 62.4 kg (03/01 0500) Last BM Date :  (PTA)  Intake/Output from previous day: 02/29 0701 - 03/01 0700 In: 3689.7 [I.V.:1783.3; NG/GT:170; IV Piggyback:1736.4] Out: 2350 [Urine:2350] Intake/Output this shift: Total I/O In: 183.6 [I.V.:183.6] Out: -   General appearance: no distress Resp: clear to auscultation bilaterally Neuro: opens eyes but not F/C  Lab Results: CBC  Recent Labs    01/13/23 1223 01/13/23 1341 01/14/23 0333 01/14/23 0432  WBC 13.9*  --  15.9*  --   HGB 14.1  15.3   < > 15.9 15.6  HCT 42.6  45.0   < > 47.6 46.0  PLT 169  --  205  --    < > = values in this interval not displayed.   BMET Recent Labs    01/13/23 1209 01/13/23 1223 01/13/23 1341 01/14/23 0333 01/14/23 0432  NA 138 138   < > 137 139  K 4.1 4.6   < > 3.9 4.0  CL 106 107  --  104  --   CO2 18*  --   --  18*  --   GLUCOSE 187* 158*  --  135*  --   BUN 31* 41*  --  37*  --   CREATININE 1.79* 2.00*  --  2.16*  --   CALCIUM 8.3*  --   --  8.8*  --    < > = values in this interval not displayed.   PT/INR Recent Labs    01/13/23 1223  LABPROT 24.4*  INR 2.2*   ABG Recent Labs    01/13/23 1639 01/14/23 0432  PHART 7.215* 7.325*  HCO3 19.5* 20.0    Studies/Results:   Anti-infectives: Anti-infectives (From admission, onward)    Start     Dose/Rate Route Frequency Ordered Stop   01/13/23 1345  Ampicillin-Sulbactam (UNASYN) 3 g in sodium chloride 0.9 % 100  mL IVPB        3 g 200 mL/hr over 30 Minutes Intravenous Every 12 hours 01/13/23 1339         Assessment/Plan: CPR related rib FXs and sternal FX - no PTX nor effusion on F/U CXR. Please recall Trauma as needed.   LOS: 1 day    Georganna Skeans, MD, MPH, FACS Trauma & General Surgery Use [email or web address] to contact on call provider  01/14/2023

## 2023-01-14 NOTE — Progress Notes (Signed)
Second attempt for Echocardiogram, patient care in progress. Will try later.

## 2023-01-14 NOTE — Progress Notes (Signed)
Weaning sedation, patient responsive to voice and following some commands but becoming restless and dyssynchronous with ventilator, sedation started back at lower dose.

## 2023-01-14 NOTE — Progress Notes (Addendum)
Advanced Heart Failure Rounding Note  PCP-Cardiologist: None   Subjective:   2/29 Admitted PEA. CPR ~ 9 min 4 rounds of epi. Intubated and placed on pressors.   Currently on Epi 20 mcg + Norepi 8 mcg + Fentanyl 150 mcg.   Remains intubated. Opens eyes. Not following commands.     Objective:   Weight Range: 62.4 kg Body mass index is 19.19 kg/m.   Vital Signs:   Temp:  [93.9 F (34.4 C)-99.9 F (37.7 C)] 98.2 F (36.8 C) (03/01 0815) Pulse Rate:  [35-141] 65 (03/01 0815) Resp:  [0-58] 32 (03/01 0815) BP: (62-164)/(13-91) 116/62 (03/01 0800) SpO2:  [43 %-100 %] 100 % (03/01 0815) Arterial Line BP: (76-147)/(47-76) 106/53 (03/01 0815) FiO2 (%):  [40 %-100 %] 40 % (03/01 0800) Weight:  [62.4 kg] 62.4 kg (03/01 0500) Last BM Date :  (PTA)  Weight change: Filed Weights   01/14/23 0500  Weight: 62.4 kg    Intake/Output:   Intake/Output Summary (Last 24 hours) at 01/14/2023 0904 Last data filed at 01/14/2023 0800 Gross per 24 hour  Intake 3873.3 ml  Output 2350 ml  Net 1523.3 ml      Physical Exam    General:  Intubated HEENT: ETT. R scalp hematoma.  Neck: Supple. JVP difficult to assess. . Carotids 2+ bilat; no bruits. No lymphadenopathy or thyromegaly appreciated. Cor: PMI nondisplaced. Regular rate & rhythm. No rubs, gallops or murmurs. Lungs: Clear Abdomen: Soft, nontender, nondistended. No hepatosplenomegaly. No bruits or masses. Good bowel sounds. Extremities: No cyanosis, clubbing, rash, edema Neuro: Intubated. Opens eyes. MAE. Not following commands.   Telemetry   SR 60s   EKG    N/A   Labs    CBC Recent Labs    01/13/23 1223 01/13/23 1341 01/14/23 0333 01/14/23 0432  WBC 13.9*  --  15.9*  --   HGB 14.1  15.3   < > 15.9 15.6  HCT 42.6  45.0   < > 47.6 46.0  MCV 114.2*  --  108.2*  --   PLT 169  --  205  --    < > = values in this interval not displayed.   Basic Metabolic Panel Recent Labs    01/13/23 1209 01/13/23 1223  01/13/23 1341 01/14/23 0333 01/14/23 0432  NA 138 138   < > 137 139  K 4.1 4.6   < > 3.9 4.0  CL 106 107  --  104  --   CO2 18*  --   --  18*  --   GLUCOSE 187* 158*  --  135*  --   BUN 31* 41*  --  37*  --   CREATININE 1.79* 2.00*  --  2.16*  --   CALCIUM 8.3*  --   --  8.8*  --   MG  --   --   --  2.2  --   PHOS  --   --   --  3.8  --    < > = values in this interval not displayed.   Liver Function Tests Recent Labs    01/13/23 1209 01/14/23 0333  AST 85* 191*  ALT 48* 228*  ALKPHOS 97 111  BILITOT 0.7 1.2  PROT 6.0* 6.2*  ALBUMIN 3.1* 3.1*   No results for input(s): "LIPASE", "AMYLASE" in the last 72 hours. Cardiac Enzymes Recent Labs    01/13/23 1209  CKTOTAL 205    BNP: BNP (last 3 results) Recent Labs  12/07/22 1307 12/31/22 1156 01/13/23 1344  BNP >4,500.0* 1,669.4* 1,073.1*    ProBNP (last 3 results) Recent Labs    06/18/22 1331 10/05/22 0848  PROBNP 1,795* 3,434*     D-Dimer No results for input(s): "DDIMER" in the last 72 hours. Hemoglobin A1C Recent Labs    01/13/23 1223  HGBA1C 5.9*   Fasting Lipid Panel Recent Labs    01/14/23 0333  TRIG 48   Thyroid Function Tests Recent Labs    01/13/23 1635  TSH 29.480*    Other results:   Imaging    DG Chest Port 1 View  Result Date: 01/14/2023 CLINICAL DATA:  The tracheal tube placement. EXAM: PORTABLE CHEST 1 VIEW COMPARISON:  01/13/2023 FINDINGS: 0501 hours. Endotracheal tube tip is 5.4 cm above the base of the carina. The NG tube passes into the stomach although the distal tip position is not included on the film. Left subclavian central line tip overlies the low trachea at the midline mediastinum, stable. The cardio pericardial silhouette is enlarged. Diffuse interstitial opacity noted with patchy airspace disease in the right upper and lower lung as well as right upper lobe. No evidence for pneumothorax or substantial pleural effusion. Telemetry leads overlie the chest.  IMPRESSION: 1. Endotracheal tube tip is 5.4 cm above the base of the carina. 2. Left subclavian central line tip overlies the low trachea at the midline mediastinum, possibly due to leftward patient rotation. Tip position of this catheter cannot be confirmed on single plane chest x-ray. This catheter is new since CT scan yesterday. If there is concern for left subclavian catheter malposition, repeat CT or injection of a small amount of contrast under fluoro may prove helpful. Electronically Signed   By: Misty Stanley M.D.   On: 01/14/2023 05:59   DG CHEST PORT 1 VIEW  Result Date: 01/13/2023 CLINICAL DATA:  Central line placement EXAM: PORTABLE CHEST 1 VIEW COMPARISON:  Chest x-ray 01/13/2023 FINDINGS: Left-sided central venous catheter tip projects over the proximal SVC. Endotracheal tube tip is proximally 4.5 cm above the carina. Enteric tube extends below the diaphragm. Surgical clips overlie the neck. Cardiomediastinal silhouette is stable, the heart is mildly enlarged. Bilateral hazy and patchy airspace opacities have not significantly changed. There are small pleural effusions. There is no pneumothorax. Osseous structures are stable. IMPRESSION: 1. Left-sided central venous catheter tip projects over the proximal SVC. 2. Stable bilateral hazy and patchy airspace opacities. Electronically Signed   By: Ronney Asters M.D.   On: 01/13/2023 16:36   DG Abd 1 View  Result Date: 01/13/2023 CLINICAL DATA:  NG tube placement. EXAM: ABDOMEN - 1 VIEW COMPARISON:  CT scan, same date. FINDINGS: The NG tube tip is in the body region of the stomach. The upper abdominal bowel gas pattern is unremarkable. IMPRESSION: NG tube tip is in the body region of the stomach. Electronically Signed   By: Marijo Sanes M.D.   On: 01/13/2023 16:33   CT CHEST ABDOMEN PELVIS WO CONTRAST  Result Date: 01/13/2023 CLINICAL DATA:  Patient found down, cardiopulmonary resuscitation EXAM: CT CHEST, ABDOMEN AND PELVIS WITHOUT CONTRAST  TECHNIQUE: Multidetector CT imaging of the chest, abdomen and pelvis was performed following the standard protocol without IV contrast. RADIATION DOSE REDUCTION: This exam was performed according to the departmental dose-optimization program which includes automated exposure control, adjustment of the mA and/or kV according to patient size and/or use of iterative reconstruction technique. COMPARISON:  CT examinations from 06/12/2022 FINDINGS: CT CHEST FINDINGS Cardiovascular: Cardiomegaly noted. Atherosclerotic calcification  of the thoracic aorta and branch vessels. Today's exam is performed without IV contrast and accordingly vascular patency is not assessed. Mediastinum/Nodes: Orogastric tube terminates at the gastroesophageal junction, consider advancing 10 cm. Nasogastric tube satisfactorily positioned. Very small retrosternal hematoma as on image 33 series 2, associated with the nondisplaced transverse sternal fracture. Lungs/Pleura: Secondary pulmonary lobular interstitial thickening with scattered airspace opacities which have accentuated density dependently favoring acute pulmonary edema. More confluent airspace opacities are present in the lower lobes and posteriorly in the right upper lobe, and aspiration pneumonia is not excluded. Small areas of confluent nodularity for example in the right middle lobe on image 80 of series 4 and in the lingula on image 75 series 4, probably due to edema or infection although surveillance imaging will be recommended to exclude progression. No substantial pleural effusion. Musculoskeletal: In addition to more cephalad deformity in the sternal body from an old healed fracture, there is a transverse mid to lower sternal body fracture shown on image 64 series 6, extending in between the costosternal junctions of the third and fourth ribs. There are vertical fractures in the costal cartilage of the right second, third, fourth, fifth, sixth, and seventh ribs best appreciated on  coronal images. There also fractures in the costal cartilage of the left anterior second, third, fourth, fifth, and sixth rib costal cartilage. In addition are fractures of the bony portions of the bilateral anterior second, third, fourth, fifth, sixth, and seventh ribs. Chondrocalcinosis in both glenohumeral joints. Thoracic spondylosis. CT ABDOMEN PELVIS FINDINGS Hepatobiliary: Trace perihepatic ascites. Reduced sensitivity for hepatic laceration due to streak artifact and lack of IV contrast. Pancreas: Unremarkable Spleen: Unremarkable Adrenals/Urinary Tract: Unremarkable Stomach/Bowel: Unremarkable Vascular/Lymphatic: Atherosclerosis is present, including aortoiliac atherosclerotic disease. Reproductive: Small bilateral hydroceles. Suspected epididymal cysts or spermatoceles bilaterally. Other: No supplemental non-categorized findings. Musculoskeletal: Calcific tendinopathy proximally in both hamstring tendons. Stable grade 1 degenerative anterolisthesis at L3-4 and L4-5. Chondrocalcinosis of the acetabular labrum bilaterally. IMPRESSION: 1. Acute nondisplaced transverse fracture of the mid to lower sternal body. 2. Acute fractures to the costal cartilage of the bilateral second through sixth ribs and also the right seventh costal cartilage. There are also acute bilateral fractures of the bony anterior portions of the bilateral second through seventh ribs. 3. Secondary pulmonary lobular interstitial thickening with scattered airspace opacities favoring acute pulmonary edema. More confluent airspace opacities are present in the lower lobes and posteriorly in the right upper lobe, and aspiration pneumonia is not excluded. 4. Trace perihepatic ascites. 5. Orogastric tube terminates at the gastroesophageal junction, consider advancing 10 cm. 6. Small bilateral hydroceles. Suspected epididymal cysts or spermatoceles bilaterally. 7. Chondrocalcinosis in both glenohumeral joints, acetabular labrum, and both hamstring  tendons. 8. Aortic atherosclerosis. Aortic Atherosclerosis (ICD10-I70.0). Electronically Signed   By: Van Clines M.D.   On: 01/13/2023 12:37   CT CERVICAL SPINE WO CONTRAST  Result Date: 01/13/2023 CLINICAL DATA:  Patient found down between a radiator and a wall. Cardiopulmonary resuscitation. Right hand injury. EXAM: CT CERVICAL SPINE WITHOUT CONTRAST TECHNIQUE: Multidetector CT imaging of the cervical spine was performed without intravenous contrast. Multiplanar CT image reconstructions were also generated. RADIATION DOSE REDUCTION: This exam was performed according to the departmental dose-optimization program which includes automated exposure control, adjustment of the mA and/or kV according to patient size and/or use of iterative reconstruction technique. COMPARISON:  None Available. FINDINGS: Alignment: 2 mm free fused anterolisthesis at C4-5 with interbody and bilateral facet fusion. Skull base and vertebrae: Substantial spurring at the anterior C1-2  articulation. Notable pannus posterior to the odontoid. No cervical spine fracture or acute bony findings. Soft tissues and spinal canal: Edema and airspace opacities at the lung bases. Orogastric tube and endotracheal tube noted. Bilateral carotid atherosclerotic calcification. Thyroidectomy. Disc levels: Uncinate and facet spurring cause mild right foraminal impingement at C5-6 and C6-7 along with left foraminal impingement at C3-4, C4-5, and C5-6. There is moderate central narrowing of the thecal sac at C3-4 due to chronic disc osteophyte complex and left paracentral disc protrusion. Upper chest: Please see dedicated chest CT report. Other: No supplemental non-categorized findings. IMPRESSION: 1. No acute cervical spine findings. 2. Cervical spondylosis and degenerative disc disease causing multilevel foraminal impingement. There is also moderate central narrowing of the thecal sac at C3-4 due to chronic disc osteophyte complex and left paracentral  disc protrusion. 3. 2 mm free fused anterolisthesis at C4-5. 4. Edema and airspace opacities at the lung bases. 5. Bilateral carotid atherosclerotic calcification. Electronically Signed   By: Van Clines M.D.   On: 01/13/2023 12:23   CT HEAD WO CONTRAST  Result Date: 01/13/2023 CLINICAL DATA:  Head trauma. EXAM: CT HEAD WITHOUT CONTRAST TECHNIQUE: Contiguous axial images were obtained from the base of the skull through the vertex without intravenous contrast. RADIATION DOSE REDUCTION: This exam was performed according to the departmental dose-optimization program which includes automated exposure control, adjustment of the mA and/or kV according to patient size and/or use of iterative reconstruction technique. COMPARISON:  Head MRI 06/30/2017 FINDINGS: Brain: There is no evidence of an acute infarct, intracranial hemorrhage, mass, midline shift, or extra-axial fluid collection. Mild cerebral atrophy is within normal limits for age. Cerebral white matter hypodensities are nonspecific but compatible with mild chronic small vessel ischemic disease. Vascular: Calcified atherosclerosis at the skull base. No hyperdense vessel. Skull: No acute fracture or suspicious osseous lesion. Sinuses/Orbits: Trace fluid in the right sphenoid sinus. Clear mastoid air cells. Bilateral cataract extraction. Other: Small to moderate-sized right parietal scalp hematoma and laceration. IMPRESSION: 1. No evidence of acute intracranial abnormality. 2. Right parietal scalp hematoma. Electronically Signed   By: Logan Bores M.D.   On: 01/13/2023 12:22   DG Pelvis Portable  Result Date: 01/13/2023 CLINICAL DATA:  Possible trauma, found unresponsive at work EXAM: PORTABLE PELVIS 1-2 VIEWS COMPARISON:  None Available. FINDINGS: No displaced fracture or dislocation is seen. Bony spurs are noted in both hips. Scattered vascular calcifications are seen in soft tissues. Degenerative changes are noted in visualized lower lumbar spine.  IMPRESSION: No displaced fracture or dislocation is seen. Electronically Signed   By: Elmer Picker M.D.   On: 01/13/2023 12:10   DG Chest Port 1 View  Result Date: 01/13/2023 CLINICAL DATA:  Pain after trauma EXAM: PORTABLE CHEST 1 VIEW COMPARISON:  X-ray 06/12/2022 FINDINGS: ET tube in place with tip 5 cm above the carina enlarged cardiopericardial silhouette. Overlapping cardiac leads and defibrillator pads. Diffuse interstitial changes identified, possibly components of edema. Apical pleural thickening on the right-greater-than-left. No obvious pneumothorax or effusion of the inferior costophrenic angles are clipped off the edge of the film. Surgical clips along the thoracic inlet. IMPRESSION: ET tube in place. Hyperinflation with enlarged cardiopericardial silhouette and interstitial edema. Electronically Signed   By: Jill Side M.D.   On: 01/13/2023 12:04     Medications:     Scheduled Medications:  Chlorhexidine Gluconate Cloth  6 each Topical Daily   docusate  100 mg Per Tube BID   heparin  5,000 Units Subcutaneous Q8H  insulin aspart  0-9 Units Subcutaneous Q4H   levothyroxine  175 mcg Per Tube Daily   mouth rinse  15 mL Mouth Rinse Q2H   pantoprazole (PROTONIX) IV  40 mg Intravenous QHS   polyethylene glycol  17 g Per Tube Daily    Infusions:  sodium chloride 10 mL/hr at 01/14/23 0800   ampicillin-sulbactam (UNASYN) IV Stopped (01/14/23 [id number])   epinephrine 20 mcg/min (01/14/23 0824)   fentaNYL infusion INTRAVENOUS 100 mcg/hr (01/14/23 0800)   norepinephrine (LEVOPHED) Adult infusion 10 mcg/min (01/14/23 0800)   propofol (DIPRIVAN) infusion 10 mcg/kg/min (01/14/23 0800)    PRN Medications: fentaNYL, mouth rinse    Patient Profile  Curtis Ramos is a 89 year old with a history of HFrEF, PVCs, LBBB, MAI infection, GERD, HTN, hypothyroidism, and thyroid cancer.   Admitted  PEA arrest/shock. CPR  ~ 9 min -->nondisplaced transverse fracture of mid-lower sternal  body, acute fx of costal cartilage of bilateral 2nd-6th ribs + 7th costal cartilage, acute fx of anterior 2nd-7th ribs.    Assessment/Plan  1. Cardiac Arrest--> Shock --> cardiogenic PEA arrest--> 9 min CPR 4 rounds epi. Unshockable rhythm.  Lactic acid 7>4.6 >2.9  CO2 18 Rib fracture 2-7 ribs  - LFTS elevated suspect in the setting of shock. Follow  - On Epi 20 mcg + norepi 8 mcg.  - Check CO-OX. Set up CVP  - Will follow for prognostication.    2. Acute Hypoxic Respiratory Failure -Intubated--> FI)2 40%.  -Covering with antibiotics with acute exacerbation.    3. Fall -->scalp hematoma -CT - R scalp hematoma. No acute intracranial abnormality.     4. A/C HFrEF, NICM -->Cardiogenic  Most recent [id number]   EF 20-25% LV markedly dilated + dyssynchrony - RV ok  Moderate MR/TR severe biatrial enlargement  - cMRI 2/24: LBBB, LV EF 17%. Upper normal right ventricular size with RV EF 33%. Severe left atrial enlargement. No LGE.  - Zio 2/24  AFL 100% avg rat 67 PVC 1.8% - Set up CVP. Check CO-OX.  - As above on norepi 8 mcg + epi 20 mcg.  - GDMT on hold while on pressors.    5. AKI Creatinine baseline~ 1.6 - Today creatinine 2.16   6. Hypothyroidism  H/O Thyroid cancer. TSH 29 T4 1.26 Suspect in the setting of acute event.      Length of Stay: 1  Amy Clegg, NP  01/14/2023, 9:04 AM  Advanced Heart Failure Team Pager [phone] (M-F; 7a - 5p)  Please contact [facility] for night-coverage after hours (5p -7a ) and weekends on [email or web address]   Agree with above.   Awake on vent. Agitated but will follow some commands  On epi 20 NE 10. Co-ox high. CVP 5-6 lactate clearing. Making urine  General:  on vent awake. Ill appearing. Agitated Neck: supple. no JVD. Carotids 2+ bilat; no bruits. No lymphadenopathy or thryomegaly appreciated. Cor: Flail chest Regular rate & rhythm. No rubs, gallops or murmurs. Lungs: coarse Abdomen: soft, nontender, nondistended. No hepatosplenomegaly.  No bruits or masses. Good bowel sounds. Extremities: no cyanosis, clubbing, rash, edema Neuro:awake agitated with close eyes to command  Remains critically ill post-arrest but now seems to be following commands. Will attempt to wean inotropes and pressors. Prognosis still very tenuous given advanced age and MSOF. D/w CCM and family personally.  CRITICAL CARE Performed by: Glori Bickers  Total critical care time: 35 minutes  Critical care time was exclusive of separately billable procedures and treating other patients.  Critical care was necessary to treat or prevent imminent or life-threatening deterioration.  Critical care was time spent personally by me (independent of midlevel providers or residents) on the following activities: development of treatment plan with patient and/or surrogate as well as nursing, discussions with consultants, evaluation of patient's response to treatment, examination of patient, obtaining history from patient or surrogate, ordering and performing treatments and interventions, ordering and review of laboratory studies, ordering and review of radiographic studies, pulse oximetry and re-evaluation of patient's condition.  Glori Bickers, MD  6:39 PM

## 2023-01-14 NOTE — Progress Notes (Signed)
eLink Physician-Brief Progress Note Patient Name: Curtis Ramos DOB: Jan 07, 1932 MRN: [id number]   Date of Service  01/14/2023  HPI/Events of Note  Patient with acute liver injury, likely ischemic hepatopathy from combination of recent cardiac arrest and heart failure,  eICU Interventions  Trend LFT's.        [name] 01/14/2023, 6:13 AM

## 2023-01-14 NOTE — Progress Notes (Signed)
LTM EEG hooked up and running - no initial skin breakdown - push button tested - [facility] monitoring.  

## 2023-01-14 NOTE — Progress Notes (Signed)
eLink Physician-Brief Progress Note Patient Name: Curtis Ramos DOB: 15-Jun-1932 MRN: [id number]   Date of Service  01/14/2023  HPI/Events of Note  Patient without a gag but he is on a considerable amount of sedation, pupillary exam unchanged and benign. Patient is on an FiO2 of 90 percent with a saturation of 100 %.  eICU Interventions  Bedside RN to wean sedation and  assess neurological exam serially. RT sent instructions to wean FiO2 then obtain an ABG.        Kerry Kass Cylan Borum 01/14/2023, 12:52 AM

## 2023-01-14 NOTE — Progress Notes (Signed)
Echocardiogram 2D Echocardiogram has been performed.  Ronny Flurry 01/14/2023, 4:51 PM

## 2023-01-15 DIAGNOSIS — R4182 Altered mental status, unspecified: Secondary | ICD-10-CM | POA: Diagnosis not present

## 2023-01-15 DIAGNOSIS — I469 Cardiac arrest, cause unspecified: Secondary | ICD-10-CM | POA: Diagnosis not present

## 2023-01-15 LAB — CBC
HCT: 40.1 % (ref 39.0–52.0)
Hemoglobin: 13.9 g/dL (ref 13.0–17.0)
MCH: 36.6 pg — ABNORMAL HIGH (ref 26.0–34.0)
MCHC: 34.7 g/dL (ref 30.0–36.0)
MCV: 105.5 fL — ABNORMAL HIGH (ref 80.0–100.0)
Platelets: 148 10*3/uL — ABNORMAL LOW (ref 150–400)
RBC: 3.8 MIL/uL — ABNORMAL LOW (ref 4.22–5.81)
RDW: 14.7 % (ref 11.5–15.5)
WBC: 13.1 10*3/uL — ABNORMAL HIGH (ref 4.0–10.5)
nRBC: 0 % (ref 0.0–0.2)

## 2023-01-15 LAB — GLUCOSE, CAPILLARY
Glucose-Capillary: 80 mg/dL (ref 70–99)
Glucose-Capillary: 76 mg/dL (ref 70–99)
Glucose-Capillary: 91 mg/dL (ref 70–99)
Glucose-Capillary: 33 mg/dL — CL (ref 70–99)
Glucose-Capillary: 248 mg/dL — ABNORMAL HIGH (ref 70–99)
Glucose-Capillary: 127 mg/dL — ABNORMAL HIGH (ref 70–99)
Glucose-Capillary: 92 mg/dL (ref 70–99)

## 2023-01-15 LAB — COMPREHENSIVE METABOLIC PANEL
ALT: 117 U/L — ABNORMAL HIGH (ref 0–44)
AST: 44 U/L — ABNORMAL HIGH (ref 15–41)
Albumin: 2.6 g/dL — ABNORMAL LOW (ref 3.5–5.0)
Alkaline Phosphatase: 82 U/L (ref 38–126)
Anion gap: 8 (ref 5–15)
BUN: 32 mg/dL — ABNORMAL HIGH (ref 8–23)
CO2: 20 mmol/L — ABNORMAL LOW (ref 22–32)
Calcium: 9.1 mg/dL (ref 8.9–10.3)
Chloride: 114 mmol/L — ABNORMAL HIGH (ref 98–111)
Creatinine, Ser: 1.44 mg/dL — ABNORMAL HIGH (ref 0.61–1.24)
GFR, Estimated: 47 mL/min — ABNORMAL LOW (ref >60)
Glucose, Bld: 116 mg/dL — ABNORMAL HIGH (ref 70–99)
Potassium: 3.5 mmol/L (ref 3.5–5.1)
Sodium: 142 mmol/L (ref 135–145)
Total Bilirubin: 1.5 mg/dL — ABNORMAL HIGH (ref 0.3–1.2)
Total Protein: 5.7 g/dL — ABNORMAL LOW (ref 6.5–8.1)

## 2023-01-15 LAB — CULTURE, RESPIRATORY W GRAM STAIN: Culture: NORMAL

## 2023-01-15 LAB — PHOSPHORUS
Phosphorus: 2.3 mg/dL — ABNORMAL LOW (ref 2.5–4.6)
Phosphorus: 2.7 mg/dL (ref 2.5–4.6)

## 2023-01-15 LAB — MAGNESIUM
Magnesium: 2.1 mg/dL (ref 1.7–2.4)
Magnesium: 2.1 mg/dL (ref 1.7–2.4)

## 2023-01-15 LAB — COOXEMETRY PANEL
Carboxyhemoglobin: 1.1 % (ref 0.5–1.5)
Methemoglobin: <0.7 % (ref 0.0–1.5)
O2 Saturation: 70.8 %
Total hemoglobin: 13.9 g/dL (ref 12.0–16.0)

## 2023-01-15 MED ORDER — HYDROCORTISONE SOD SUC (PF) 100 MG IJ SOLR
100.0000 mg | Freq: Three times a day (TID) | INTRAMUSCULAR | Status: DC
  Administered 2023-01-15 – 2023-01-17 (×7): 100 mg via INTRAVENOUS
  Filled 2023-01-15 (×7): qty 2

## 2023-01-15 MED ORDER — POTASSIUM PHOSPHATES 15 MMOLE/5ML IV SOLN
15.0000 mmol | Freq: Once | INTRAVENOUS | Status: AC
  Administered 2023-01-15: 15 mmol via INTRAVENOUS
  Filled 2023-01-15: qty 5

## 2023-01-15 MED ORDER — POTASSIUM CHLORIDE 10 MEQ/50ML IV SOLN
10.0000 meq | INTRAVENOUS | Status: AC
  Administered 2023-01-15 (×4): 10 meq via INTRAVENOUS
  Filled 2023-01-15 (×4): qty 50

## 2023-01-15 MED ORDER — MIDAZOLAM HCL 2 MG/2ML IJ SOLN
2.0000 mg | INTRAMUSCULAR | Status: DC | PRN
  Administered 2023-01-15 – 2023-01-16 (×2): 2 mg via INTRAVENOUS
  Filled 2023-01-15 (×3): qty 2

## 2023-01-15 MED ORDER — DEXTROSE 50 % IV SOLN
INTRAVENOUS | Status: AC
  Administered 2023-01-15: 50 mL
  Filled 2023-01-15: qty 50

## 2023-01-15 MED ORDER — DEXTROSE 10 % IV SOLN: INTRAVENOUS | Status: DC

## 2023-01-15 MED ORDER — SODIUM CHLORIDE 0.9 % IV SOLN
3.0000 g | Freq: Four times a day (QID) | INTRAVENOUS | Status: DC
  Administered 2023-01-15 – 2023-01-16 (×5): 3 g via INTRAVENOUS
  Filled 2023-01-15 (×5): qty 8

## 2023-01-15 MED ORDER — DEXTROSE 10 % IV SOLN: INTRAVENOUS | Status: DC | PRN

## 2023-01-15 NOTE — Progress Notes (Signed)
[facility] Progress Note Patient Name: Curtis Ramos DOB: July 17, 1932 MRN: [id number]   Date of Service  01/15/2023  HPI/Events of Note  Potassium 3.5  eICU Interventions  replaced     Intervention Category Intermediate Interventions: Electrolyte abnormality - evaluation and management  KAELAN, MANDALA, P 01/15/2023, 5:33 AM

## 2023-01-15 NOTE — Procedures (Signed)
Patient Name: Curtis Ramos  MRN: [id number]  Epilepsy Attending: Lora Havens  Referring Physician/Provider: Jacky Kindle, MD  Duration: 01/14/2023 1159 to  01/15/2023 1159  Patient history: 89yo M s/p cardiac arrest. EEG to evaluate for seizure  Level of alertness:  comatose  AEDs during EEG study: Propofol  Technical aspects: This EEG study was done with scalp electrodes positioned according to the 10-20 International system of electrode placement. Electrical activity was reviewed with band pass filter of 1-'70Hz'$ , sensitivity of 7 uV/mm, display speed of 46m/sec with a '60Hz'$  notched filter applied as appropriate. EEG data were recorded continuously and digitally stored.  Video monitoring was available and reviewed as appropriate.  Description: EEG showed continuous generalized low amplitude 3 to 5 Hz theta-delta slowing.  Hyperventilation and photic stimulation were not performed.     Event button was pressed on 01/15/2023 for head drop, gaze upwards. Concomitant eeg before, during and after the event didn't show an eeg change to suggest seizure.   ABNORMALITY - Continuous slow, generalized  IMPRESSION: This study is suggestive of severe diffuse encephalopathy, nonspecific etiology. No seizures or epileptiform discharges were seen throughout the recording.  Event button was pressed on 01/15/2023 for head drop, gaze upwards without concomitant eeg change. This was most likely not an epileptic event.   Curtis Ramos

## 2023-01-15 NOTE — Progress Notes (Signed)
Hypoglycemic Event  CBG: 33  Treatment: Dextrose 50 one ampule Symptoms: None  Follow-up CBG: Time:1540 CBG Result:248  Possible Reasons for Event: Unknown  Comments/MD notified: Dr Ina Homes notified, d10 ordered as PRN if.     Nonnie Done

## 2023-01-15 NOTE — Progress Notes (Signed)
NAME:  Curtis Ramos, MRN:  [id number], DOB:  1932/09/29, LOS: 2 ADMISSION DATE:  01/13/2023, CONSULTATION DATE:  01/13/2023 REFERRING MD:  Trauma, CHIEF COMPLAINT:  Cardiac arrest, post-fall (Level 1 Trauma)   History of Present Illness:  89 year old man who presented to [facility] ED 2/29 via EMS after unwitnessed fall at work. PMHx significant for HTN, HFrEF with NICM (cMRI 12/2022 with severe LV dilation, EF 17%, LBBB, followed by Dr. Haroldine Laws), AFib (on Eliquis), MAI infection, GERD, hypothyroidism, history of thyroid CA.   History obtained from chart review and from family. Patient presented to [facility] ED via EMS after a fall at work; per family, patient did not have any complaints prior to fall. Recently undergoing workup with Cardiology for pacemaker placement. He was found down between a radiator and the wall, unresponsive with contusion and deformities to R side of his head. Initially with tachycardic with EMS, then bradycardic. ST elevation noted on EKG and Code STEMI was called, later called off as ST elevation had resolved. Patient lost pulses while in ED and became apneic with cardiac monitoring showing PEA arrest. CPR administered x 9 minutes with Epi x 4 and ROSC, patient never had a shockable rhythm. Intubated peri-arrest. Cards was consulted, noting high risk of VT/VF in the setting of severe CM. AHF was also notified of admission.   Labs demonstrated WBC 13.9, Hgb 14.1, Plt 169. INR 2.2 (on Eliquis). Na 138, K 4.1, CO2 18, Cr 1.79 (baseline ~1.3). CK 205. LA 7.0 > 4.6. Trop 27 > 155. BNP 1073. UA with glucose > 500, +protein. PCT < 0.10. CT Head NAICA, R parietal scalp hematoma. CT C-spine negative for acute findings. CT Chest/A/P with nondisplaced transverse fracture of mid-lower sternal body, acute fx of costal cartilage of bilateral 2nd-6th ribs + 7th costal cartilage, acute fx of anterior 2nd-7th ribs; pulmonary lobular interstitial thickening (?pulm edema), BLL opacities, trace ascites.  Empiric Unasyn started for ?aspiration.   PCCM was consulted for ICU admission. Pertinent  Medical History   Past Medical History:  Diagnosis Date   Acute on chronic combined systolic and diastolic CHF (congestive heart failure) ([place]) 03/19/2019   AKI (acute kidney injury) ([place]) 12/26/2014   Arthralgia 11/04/2014   Arthritis    "proabably"   Atypical mycobacterium infection    Cancer [facility])    family unaware of this hx on [id number]   Chronic systolic heart failure (HCC)    Decreased oral intake 12/26/2014   Decreased oral intake 12/26/2014   Depressed left ventricular ejection fraction 02/05/2020   Dilated cardiomyopathy ([place]) 03/19/2019   Ejection fraction 20 to 25% based on echocardiogram done by pulmonologist month ago.   DJD (degenerative joint disease) 10/07/2014   Dyspnea on exertion 03/19/2019   Essential hypertension 02/05/2020   GERD (gastroesophageal reflux disease)    Hammer toes of both feet 01/30/2018   History of thyroid cancer 12/16/2016   Hypercalcemia 12/26/2014   Hypothyroidism    Hypothyroidism associated with surgical procedure 10/07/2014   Lung mass    Myalgia 11/04/2014   Mycobacterium avium complex ([place])    Nail dystrophy 01/30/2018   Occult malignancy (HCC)    PAF (paroxysmal atrial fibrillation) ([place]) 02/05/2020   Persistent atrial fibrillation ([place]) 03/19/2019   Chads 2 Vascor equals 3   PONV (postoperative nausea and vomiting)    Pre-ulcerative calluses 01/30/2018   Pseudoaneurysm following procedure ([place]) 04/16/2020   Sensorineural hearing loss (SNHL), bilateral 08/11/2018   Shingles    Toenail fungus 01/30/2018   Wears  glasses    Significant Hospital Events: Including procedures, antibiotic start and stop dates in addition to other pertinent events   2/29 - Presented to [facility] ED after 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 Found between radiator and wall with R-sided head injury. Suspected cardiac etiology of unresponsiveness. On ED arrival,  became pulseless (rhythm PEA). CPR x 9 min, Epi x 4 with ROSC. Intubated peri-arrest. CT Head, C-Spine, Chest/A/P completed (as above). Unasyn for ?aspiration. R axillary A-line, L subclavian CVC, ETT tube exchange + bronch.   Interim History / Subjective:  Follows commands intermittently. Pressor needs slightly improved Agitated and fights vent with any sedation wean  Objective   Blood pressure (!) 108/56, pulse 67, temperature 99.5 F (37.5 C), resp. rate (!) 28, height '5\' 11"'$  (1.803 m), weight 64.6 kg, SpO2 100 %. CVP:  [3 mmHg-8 mmHg] 4 mmHg  Vent Mode: PRVC FiO2 (%):  [40 %] 40 % Set Rate:  [32 bmp] 32 bmp Vt Set:  [520 mL] 520 mL PEEP:  [5 cmH20] 5 cmH20 Plateau Pressure:  [17 cmH20-20 cmH20] 17 cmH20   Intake/Output Summary (Last 24 hours) at 01/15/2023 [id number] Last data filed at 01/15/2023 0600 Gross per 24 hour  Intake 2832.87 ml  Output 2450 ml  Net 382.87 ml    Filed Weights   01/14/23 0500 01/15/23 0102  Weight: 62.4 kg 64.6 kg   Examination: Chronically ill appearing Ext warm Flicker movement to command, track, on precedex+fent 175 Lungs with crackles bases No edema Age-related skin changes  Cr improved LFTs improved WBC improved Plts a little lower CXR upper>lower infiltrates  Resolved Hospital Problem list   N/A  Assessment & Plan:  Post-PEA arrest- presumed cardiac  in origin given sinus pauses while here Post arrest shock- labs/exam more c/w distributive Severe nonischemic cardiomyopathy, HFrEF, LF EV 17% Atrial fibrillation Post arrest acute hypoxemic resp failure with aspiration pneumonitis- does have hx of MAI, would not really worry about this hx given current clinical status Post arrest AKI/ATN- improved Post arrest shock liver- improved Hypothyroid- abn thyroid labs, recheck as OP once out of acute period, continue PTA synthorid  - vent bundle - f/u cEEG read - wean pressors for MAP 65, start stress steroids - sedation titrated to vent  synchrony - unasyn for aspiration - appreciate heart failure recs - continue vent support - would clarify GOC before extubation attempt, PMT consulted and will speak to family if they come today  32 min cc time Erskine Emery MD PCCM

## 2023-01-15 NOTE — Progress Notes (Signed)
Advanced Heart Failure Rounding Note  PCP-Cardiologist: None   Subjective:   2/29 Admitted PEA. CPR ~ 9 min 4 rounds of epi. Intubated and placed on pressors.   Remains intubated/sedated. Off epi. On NE 10 CVP 5   Will follow commands when awake but very agitated. Renal functio improving. Rhythm stable.   Co-ox 71%   Objective:   Weight Range: 64.6 kg Body mass index is 19.86 kg/m.   Vital Signs:   Temp:  [97.7 F (36.5 C)-99.9 F (37.7 C)] 98.6 F (37 C) (03/02 1015) Pulse Rate:  [59-79] 63 (03/02 1015) Resp:  [0-32] 26 (03/02 1015) BP: (82-139)/(51-69) 104/56 (03/02 1000) SpO2:  [97 %-100 %] 100 % (03/02 1138) Arterial Line BP: (56-150)/(41-96) 119/55 (03/02 1015) FiO2 (%):  [40 %] 40 % (03/02 1138) Weight:  [64.6 kg] 64.6 kg (03/02 0102) Last BM Date :  (PTA)  Weight change: Filed Weights   01/14/23 0500 01/15/23 0102  Weight: 62.4 kg 64.6 kg    Intake/Output:   Intake/Output Summary (Last 24 hours) at 01/15/2023 1225 Last data filed at 01/15/2023 1000 Gross per 24 hour  Intake 2393.79 ml  Output 2700 ml  Net -306.21 ml     Physical Ramos    General:  Intubated/sedated. Chronically-ill appearing HEENT: normal + ETT Neck: supple. no JVD. Carotids 2+ bilat; no bruits. No lymphadenopathy or thryomegaly appreciated. Cor: PMI nondisplaced. Regular rate & rhythm. + rib fractures No rubs, gallops or murmurs. Lungs: clear Abdomen: soft, nontender, nondistended. No hepatosplenomegaly. No bruits or masses. Good bowel sounds. Extremities: no cyanosis, clubbing, rash, edema Neuro: intubated/sedated   Telemetry   SR 60s Personally reviewed  Labs    CBC Recent Labs    01/14/23 0333 01/14/23 0432 01/14/23 1313 01/15/23 0400  WBC 15.9*  --   --  13.1*  HGB 15.9   < > 14.6 13.9  HCT 47.6   < > 43.0 40.1  MCV 108.2*  --   --  105.5*  PLT 205  --   --  148*   < > = values in this interval not displayed.    Basic Metabolic Panel Recent Labs     01/14/23 0333 01/14/23 0432 01/14/23 1313 01/14/23 1638 01/15/23 0400  NA 137   < > 142  --  142  K 3.9   < > 3.7  --  3.5  CL 104  --   --   --  114*  CO2 18*  --   --   --  20*  GLUCOSE 135*  --   --   --  116*  BUN 37*  --   --   --  32*  CREATININE 2.16*  --   --   --  1.44*  CALCIUM 8.8*  --   --   --  9.1  MG 2.2  --   --  2.2 2.1  PHOS 3.8  --   --  3.3 2.3*   < > = values in this interval not displayed.    Liver Function Tests Recent Labs    01/14/23 0333 01/15/23 0400  AST 191* 44*  ALT 228* 117*  ALKPHOS 111 82  BILITOT 1.2 1.5*  PROT 6.2* 5.7*  ALBUMIN 3.1* 2.6*    No results for input(s): "LIPASE", "AMYLASE" in the last 72 hours. Cardiac Enzymes Recent Labs    01/13/23 1209  CKTOTAL 205     BNP: BNP (last 3 results) Recent Labs  12/07/22 1307 12/31/22 1156 01/13/23 1344  BNP >4,500.0* 1,669.4* 1,073.1*     ProBNP (last 3 results) Recent Labs    06/18/22 1331 10/05/22 0848  PROBNP 1,795* 3,434*      D-Dimer No results for input(s): "DDIMER" in the last 72 hours. Hemoglobin A1C Recent Labs    01/13/23 1223  HGBA1C 5.9*    Fasting Lipid Panel Recent Labs    01/14/23 0333  TRIG 48    Thyroid Function Tests Recent Labs    01/13/23 1635  TSH 29.480*     Other results:   Imaging    Overnight EEG with video  Result Date: 01/15/2023 Curtis Havens, Ramos     01/15/2023  8:22 AM Patient Name: Curtis Ramos MRN: [id number] Epilepsy Attending: Lora Ramos Referring Physician/Provider: Jacky Kindle, Ramos Duration: 01/14/2023 1159 to  01/15/2023 0820 Patient history: 89yo M s/p cardiac arrest. EEG to evaluate for seizure Level of alertness:  comatose AEDs during EEG study: Propofol Technical aspects: This EEG study was done with scalp electrodes positioned according to the 10-20 International system of electrode placement. Electrical activity was reviewed with band pass filter of 1-'70Hz'$ , sensitivity of 7 uV/mm, display speed of  21m/sec with a '60Hz'$  notched filter applied as appropriate. EEG data were recorded continuously and digitally stored.  Video monitoring was available and reviewed as appropriate. Description: EEG showed continuous generalized low amplitude 3 to 5 Hz theta-delta slowing.  Hyperventilation and photic stimulation were not performed.   Event button was pressed on 01/15/2023 for head drop, gaze upwards. Concomitant eeg before, during and after the event didn't show an eeg change to suggest seizure. ABNORMALITY - Continuous slow, generalized IMPRESSION: This study is suggestive of severe diffuse encephalopathy, nonspecific etiology. No seizures or epileptiform discharges were seen throughout the recording. Event button was pressed on 01/15/2023 for head drop, gaze upwards without concomitant eeg change. This was most likely not an epileptic event. PLora Ramos  ECHOCARDIOGRAM COMPLETE  Result Date: 01/14/2023    ECHOCARDIOGRAM REPORT   Patient Name:   Curtis Ramos: 01/14/2023 Medical Rec #:  [id number]    Height:       71.0 in Accession #:    [id number]   Weight:       137.6 lb Date of Birth:  [birth year]    BSA:          1.799 m Patient Age:    89years      BP:           102/59 mmHg Patient Gender: M             HR:           65 bpm. Ramos Location:  Inpatient Procedure: 2D Echo, Cardiac Doppler, Color Doppler and Intracardiac            Opacification Agent                                 MODIFIED REPORT: This report was modified by MRudean HaskellMD on 01/14/2023 due to Spelling.  Indications:     Cardiac arrest I46.9  History:         Patient has no prior history of Echocardiogram examinations,                  most recent 09/22/2022. Cardiomyopathy and CHF,  Arrythmias:Atrial Fibrillation; Risk Factors:Hypertension.  Sonographer:     Curtis Ramos Referring Phys:  Curtis Ramos Diagnosing Phys: Curtis Ramos IMPRESSIONS  1. Left ventricular ejection fraction,  by estimation, is 20%. The left ventricle has severely decreased function. The left ventricle demonstrates regional wall motion abnormalities (abnormal septal motion). The left ventricular internal cavity size was mildly dilated. No LV thrombus.  2. The mitral valve is normal in structure. Mild to moderate mitral valve regurgitation. No evidence of mitral stenosis.  3. The aortic valve is tricuspid. There is mild calcification of the aortic valve. There is mild thickening of the aortic valve. Aortic valve regurgitation is mild. Aortic valve sclerosis is present, with no evidence of aortic valve stenosis.  4. Right ventricular systolic function is normal. The right ventricular size is moderately enlarged.  5. The inferior vena cava is normal in size with greater than 50% respiratory variability, suggesting right atrial pressure of 3 mmHg.  6. Right atrial size was mildly dilated. FINDINGS  Left Ventricle: Left ventricular ejection fraction, by estimation, is 20%. The left ventricle has severely decreased function. The left ventricle demonstrates regional wall motion abnormalities. Definity contrast agent was given IV to delineate the left  ventricular endocardial borders. The left ventricular internal cavity size was mildly dilated. There is no left ventricular hypertrophy. Abnormal (paradoxical) septal motion, consistent with left bundle branch block. Left ventricular diastolic parameters are indeterminate. Right Ventricle: The right ventricular size is moderately enlarged. No increase in right ventricular wall thickness. Right ventricular systolic function is normal. Left Atrium: Left atrial size was normal in size. Right Atrium: Right atrial size was mildly dilated. Pericardium: There is no evidence of pericardial effusion. Mitral Valve: The mitral valve is normal in structure. Mild to moderate mitral valve regurgitation. No evidence of mitral valve stenosis. Tricuspid Valve: The tricuspid valve is not well  visualized. Tricuspid valve regurgitation is mild . No evidence of tricuspid stenosis. Aortic Valve: The aortic valve is tricuspid. There is mild calcification of the aortic valve. There is mild thickening of the aortic valve. There is mild aortic valve annular calcification. Aortic valve regurgitation is mild. Aortic regurgitation PHT measures 844 msec. Aortic valve sclerosis is present, with no evidence of aortic valve stenosis. Aortic valve mean gradient measures 3.5 mmHg. Aortic valve peak gradient measures 7.3 mmHg. Aortic valve area, by VTI measures 2.57 cm. Pulmonic Valve: The pulmonic valve was not well visualized. Pulmonic valve regurgitation is not visualized. Aorta: The aortic root and ascending aorta are structurally normal, with no evidence of dilitation. Venous: The inferior vena cava is normal in size with greater than 50% respiratory variability, suggesting right atrial pressure of 3 mmHg. IAS/Shunts: No atrial level shunt detected by color flow Doppler.  LEFT VENTRICLE PLAX 2D LVIDd:         5.60 cm      Diastology LVIDs:         4.90 cm      LV e' medial:    4.37 cm/s LV PW:         1.10 cm      LV E/e' medial:  12.7 LV IVS:        1.00 cm      LV e' lateral:   5.68 cm/s LVOT diam:     2.30 cm      LV E/e' lateral: 9.8 LV SV:         57 LV SV Index:   31 LVOT Area:  4.15 cm  LV Volumes (MOD) LV vol d, MOD A2C: 157.0 ml LV vol d, MOD A4C: 196.0 ml LV vol s, MOD A2C: 123.0 ml LV vol s, MOD A4C: 143.0 ml LV SV MOD A2C:     34.0 ml LV SV MOD A4C:     196.0 ml LV SV MOD BP:      36.1 ml RIGHT VENTRICLE             IVC RV S prime:     11.90 cm/s  IVC diam: 1.90 cm TAPSE (M-mode): 1.8 cm LEFT ATRIUM             Index        RIGHT ATRIUM           Index LA diam:        3.70 cm 2.06 cm/m   RA Area:     21.80 cm LA Vol (A2C):   38.2 ml 21.24 ml/m  RA Volume:   65.00 ml  36.14 ml/m LA Vol (A4C):   65.2 ml 36.25 ml/m LA Biplane Vol: 49.9 ml 27.74 ml/m  AORTIC VALVE AV Area (Vmax):    2.71 cm AV  Area (Vmean):   2.50 cm AV Area (VTI):     2.57 cm AV Vmax:           135.50 cm/s AV Vmean:          86.200 cm/s AV VTI:            0.220 m AV Peak Grad:      7.3 mmHg AV Mean Grad:      3.5 mmHg LVOT Vmax:         88.47 cm/s LVOT Vmean:        51.767 cm/s LVOT VTI:          0.136 m LVOT/AV VTI ratio: 0.62 AI PHT:            844 msec  AORTA Ao Root diam: 3.60 cm Ao Asc diam:  3.30 cm MITRAL VALVE               TRICUSPID VALVE MV Area (PHT): 2.97 cm    TR Peak grad:   25.8 mmHg MV Decel Time: 256 msec    TR Vmax:        254.00 cm/s MV E velocity: 55.45 cm/s MV A velocity: 42.20 cm/s  SHUNTS MV E/A ratio:  1.31        Systemic VTI:  0.14 m                            Systemic Diam: 2.30 cm Curtis Ramos Electronically signed by Curtis Ramos Signature Date/Time: 01/14/2023/5:14:08 PM    Final (Updated)      Medications:     Scheduled Medications:  Chlorhexidine Gluconate Cloth  6 each Topical Daily   docusate  100 mg Per Tube BID   feeding supplement (PROSource TF20)  60 mL Per Tube Daily   heparin  5,000 Units Subcutaneous Q8H   hydrocortisone sod succinate (SOLU-CORTEF) inj  100 mg Intravenous Q8H   insulin aspart  0-9 Units Subcutaneous Q4H   levothyroxine  175 mcg Per Tube Daily   mouth rinse  15 mL Mouth Rinse Q2H   pantoprazole (PROTONIX) IV  40 mg Intravenous QHS   polyethylene glycol  17 g Per Tube Daily   thiamine  100 mg Per Tube Daily  Infusions:  sodium chloride Stopped (01/15/23 0959)   ampicillin-sulbactam (UNASYN) IV 200 mL/hr at 01/15/23 1000   dexmedetomidine (PRECEDEX) IV infusion Stopped (01/15/23 0843)   feeding supplement (OSMOLITE 1.5 CAL) 45 mL/hr at 01/15/23 1000   fentaNYL infusion INTRAVENOUS 175 mcg/hr (01/15/23 1000)   norepinephrine (LEVOPHED) Adult infusion 10 mcg/min (01/15/23 1000)   propofol (DIPRIVAN) infusion 10 mcg/kg/min (01/15/23 1000)    PRN Medications: fentaNYL, mouth rinse    Patient Profile  Curtis Ramos is a 89 year  old with a history of HFrEF, PVCs, LBBB, MAI infection, GERD, HTN, hypothyroidism, and thyroid cancer.   Admitted  PEA arrest/shock. CPR  ~ 9 min -->nondisplaced transverse fracture of mid-lower sternal body, acute fx of costal cartilage of bilateral 2nd-6th ribs + 7th costal cartilage, acute fx of anterior 2nd-7th ribs.    Assessment/Plan   1. Cardiac Arrest--> Shock --> cardiogenic shock - PEA arrest--> 9 min CPR 4 rounds epi. Unshockable rhythm.  - Rib fracture 2-7 ribs  - LFTS elevated suspect in the setting of shock. Follow  - Off epi. NE  10. Co-ox 71% Wean NE as able - CVP 5.    2. Acute Hypoxic Respiratory Failure in setting of cardiac arrest and aspiration PNA -Intubated--> FIO2 40%.  -Covering with antibiotics - CCM managing   3. Fall -->scalp hematoma -CT - R scalp hematoma. No acute intracranial abnormality.     4. A/C HFrEF, NICM -->Cardiogenic shock Most recent [id number]   EF 20-25% LV markedly dilated + dyssynchrony - RV ok  Moderate MR/TR severe biatrial enlargement  - cMRI 2/24: LBBB, LV EF 17%. Upper normal right ventricular size with RV EF 33%. Severe left atrial enlargement. No LGE.  - Zio 2/24  AFL 100% avg rat 67 PVC 1.8% - As above, Off epi. NE  10. Co-ox 71% Wean NE as able - GDMT on hold while on pressors.    5. AKI due to ATN/shock Creatinine baseline~ 1.6 - Today creatinine 2.16 -> 1.44  6. Hypothyroidism  H/O Thyroid cancer. TSH 29 T4 1.26 Suspect in the setting of acute event.   7. Shock liver - due to arrest  Remains critically ill post-arrest but now seems to be following commands. Will attempt to wean inotropes and pressors. Prognosis still very tenuous given advanced age and MSOF. Continue supportive care   Length of Stay: 2  Glori Bickers, Ramos  01/15/2023, 12:25 PM  Advanced Heart Failure Team Pager [phone] (M-F; 7a - 5p)  Please contact [facility] for night-coverage after hours (5p -7a ) and weekends on [email or web address]

## 2023-01-15 NOTE — Progress Notes (Signed)
Pharmacy Antibiotic Note  Curtis Ramos is a 89 y.o. male admitted on 01/13/2023 with  aspiration pneumonia .  Pharmacy has been consulted for Unasyn dosing. SCr improved to 1.44 today.  Plan: Adjust Unasyn to 3g IV q6h for renal function improvement Monitor C&S, s/sx improvement, renal function, LOT   Height: '5\' 11"'$  (180.3 cm) Weight: 64.6 kg (142 lb 6.7 oz) IBW/kg (Calculated) : 75.3  Temp (24hrs), Avg:98.6 F (37 C), Min:96.9 F (36.1 C), Max:99.9 F (37.7 C)  Recent Labs  Lab 01/13/23 1209 01/13/23 1223 01/13/23 1344 01/13/23 1635 01/14/23 0333 01/14/23 0821 01/14/23 1320 01/15/23 0400  WBC  --  13.9*  --   --  15.9*  --   --  13.1*  CREATININE 1.79* 2.00*  --   --  2.16*  --   --  1.44*  LATICACIDVEN  --  7.0* 4.6* 2.9*  --  3.6* 5.0*  --      Estimated Creatinine Clearance: 32.4 mL/min (A) (by C-G formula based on SCr of 1.44 mg/dL (H)).    No Known Allergies    Arturo Morton, PharmD, BCPS Please check AMION for all [place] contact numbers Clinical Pharmacist 01/15/2023 8:05 AM

## 2023-01-15 NOTE — Progress Notes (Signed)
Pharmacy Electrolyte Replacement  Recent Labs:  Recent Labs    01/15/23 0400  K 3.5  MG 2.1  PHOS 2.3*  CREATININE 1.44*    Low Critical Values (K </= 2.5, Phos </= 1, Mg </= 1) Present: None  MD Contacted: n/a - no critical values noted  Plan: KPhos 51mol IV x 1 (will provide ~22.529m of additional potassium) KCl 1060mIV x 4 already ordered per CCM this AM Recheck with AM labs per protocol   HalArturo MortonharmD, BCPS Please check AMION for all [facility] Racinentact numbers Clinical Pharmacist 01/15/2023 8:00 AM

## 2023-01-16 ENCOUNTER — Inpatient Hospital Stay (HOSPITAL_COMMUNITY): Payer: Medicare Other

## 2023-01-16 DIAGNOSIS — Z515 Encounter for palliative care: Secondary | ICD-10-CM

## 2023-01-16 DIAGNOSIS — R4182 Altered mental status, unspecified: Secondary | ICD-10-CM | POA: Diagnosis not present

## 2023-01-16 DIAGNOSIS — I469 Cardiac arrest, cause unspecified: Secondary | ICD-10-CM | POA: Diagnosis not present

## 2023-01-16 DIAGNOSIS — Z7189 Other specified counseling: Secondary | ICD-10-CM | POA: Diagnosis not present

## 2023-01-16 DIAGNOSIS — J9601 Acute respiratory failure with hypoxia: Secondary | ICD-10-CM | POA: Diagnosis not present

## 2023-01-16 LAB — CBC
HCT: 36.3 % — ABNORMAL LOW (ref 39.0–52.0)
Hemoglobin: 12.2 g/dL — ABNORMAL LOW (ref 13.0–17.0)
MCH: 36.4 pg — ABNORMAL HIGH (ref 26.0–34.0)
MCHC: 33.6 g/dL (ref 30.0–36.0)
MCV: 108.4 fL — ABNORMAL HIGH (ref 80.0–100.0)
Platelets: 143 10*3/uL — ABNORMAL LOW (ref 150–400)
RBC: 3.35 MIL/uL — ABNORMAL LOW (ref 4.22–5.81)
RDW: 14.6 % (ref 11.5–15.5)
WBC: 15.4 10*3/uL — ABNORMAL HIGH (ref 4.0–10.5)
nRBC: 0 % (ref 0.0–0.2)

## 2023-01-16 LAB — COMPREHENSIVE METABOLIC PANEL
ALT: 81 U/L — ABNORMAL HIGH (ref 0–44)
AST: 29 U/L (ref 15–41)
Albumin: 2.6 g/dL — ABNORMAL LOW (ref 3.5–5.0)
Alkaline Phosphatase: 71 U/L (ref 38–126)
Anion gap: 8 (ref 5–15)
BUN: 32 mg/dL — ABNORMAL HIGH (ref 8–23)
CO2: 23 mmol/L (ref 22–32)
Calcium: 9.7 mg/dL (ref 8.9–10.3)
Chloride: 115 mmol/L — ABNORMAL HIGH (ref 98–111)
Creatinine, Ser: 1.24 mg/dL (ref 0.61–1.24)
GFR, Estimated: 56 mL/min — ABNORMAL LOW (ref >60)
Glucose, Bld: 129 mg/dL — ABNORMAL HIGH (ref 70–99)
Potassium: 4 mmol/L (ref 3.5–5.1)
Sodium: 146 mmol/L — ABNORMAL HIGH (ref 135–145)
Total Bilirubin: 1.5 mg/dL — ABNORMAL HIGH (ref 0.3–1.2)
Total Protein: 5.8 g/dL — ABNORMAL LOW (ref 6.5–8.1)

## 2023-01-16 LAB — GLUCOSE, CAPILLARY
Glucose-Capillary: 68 mg/dL — ABNORMAL LOW (ref 70–99)
Glucose-Capillary: 228 mg/dL — ABNORMAL HIGH (ref 70–99)
Glucose-Capillary: 123 mg/dL — ABNORMAL HIGH (ref 70–99)
Glucose-Capillary: 132 mg/dL — ABNORMAL HIGH (ref 70–99)
Glucose-Capillary: 96 mg/dL (ref 70–99)
Glucose-Capillary: 144 mg/dL — ABNORMAL HIGH (ref 70–99)
Glucose-Capillary: 142 mg/dL — ABNORMAL HIGH (ref 70–99)
Glucose-Capillary: 97 mg/dL (ref 70–99)

## 2023-01-16 LAB — POCT I-STAT 7, (LYTES, BLD GAS, ICA,H+H)
Acid-Base Excess: 0 mmol/L (ref 0.0–2.0)
Acid-base deficit: 4 mmol/L — ABNORMAL HIGH (ref 0.0–2.0)
Acid-base deficit: 4 mmol/L — ABNORMAL HIGH (ref 0.0–2.0)
Bicarbonate: 22.6 mmol/L (ref 20.0–28.0)
Bicarbonate: 22.4 mmol/L (ref 20.0–28.0)
Bicarbonate: 23.9 mmol/L (ref 20.0–28.0)
Calcium, Ion: 1.44 mmol/L — ABNORMAL HIGH (ref 1.15–1.40)
Calcium, Ion: 1.47 mmol/L — ABNORMAL HIGH (ref 1.15–1.40)
Calcium, Ion: 1.47 mmol/L — ABNORMAL HIGH (ref 1.15–1.40)
HCT: 35 % — ABNORMAL LOW (ref 39.0–52.0)
HCT: 34 % — ABNORMAL LOW (ref 39.0–52.0)
HCT: 35 % — ABNORMAL LOW (ref 39.0–52.0)
Hemoglobin: 11.9 g/dL — ABNORMAL LOW (ref 13.0–17.0)
Hemoglobin: 11.6 g/dL — ABNORMAL LOW (ref 13.0–17.0)
Hemoglobin: 11.9 g/dL — ABNORMAL LOW (ref 13.0–17.0)
O2 Saturation: 99 %
O2 Saturation: 96 %
O2 Saturation: 97 %
Patient temperature: 98.5
Patient temperature: 98.3
Patient temperature: 98.6
Potassium: 4 mmol/L (ref 3.5–5.1)
Potassium: 3.9 mmol/L (ref 3.5–5.1)
Potassium: 3.8 mmol/L (ref 3.5–5.1)
Sodium: 149 mmol/L — ABNORMAL HIGH (ref 135–145)
Sodium: 149 mmol/L — ABNORMAL HIGH (ref 135–145)
Sodium: 151 mmol/L — ABNORMAL HIGH (ref 135–145)
TCO2: 24 mmol/L (ref 22–32)
TCO2: 24 mmol/L (ref 22–32)
TCO2: 25 mmol/L (ref 22–32)
pCO2 arterial: 48.4 mmHg — ABNORMAL HIGH (ref 32–48)
pCO2 arterial: 45.1 mmHg (ref 32–48)
pCO2 arterial: 36.4 mmHg (ref 32–48)
pH, Arterial: 7.276 — ABNORMAL LOW (ref 7.35–7.45)
pH, Arterial: 7.304 — ABNORMAL LOW (ref 7.35–7.45)
pH, Arterial: 7.426 (ref 7.35–7.45)
pO2, Arterial: 189 mmHg — ABNORMAL HIGH (ref 83–108)
pO2, Arterial: 90 mmHg (ref 83–108)
pO2, Arterial: 88 mmHg (ref 83–108)

## 2023-01-16 LAB — COOXEMETRY PANEL
Carboxyhemoglobin: 1.9 % — ABNORMAL HIGH (ref 0.5–1.5)
Methemoglobin: 1.7 % — ABNORMAL HIGH (ref 0.0–1.5)
O2 Saturation: 89.1 %
Total hemoglobin: 12.6 g/dL (ref 12.0–16.0)

## 2023-01-16 LAB — MAGNESIUM: Magnesium: 2.3 mg/dL (ref 1.7–2.4)

## 2023-01-16 LAB — PHOSPHORUS: Phosphorus: 2.4 mg/dL — ABNORMAL LOW (ref 2.5–4.6)

## 2023-01-16 MED ORDER — SUCCINYLCHOLINE CHLORIDE 200 MG/10ML IV SOSY
PREFILLED_SYRINGE | INTRAVENOUS | Status: AC
  Filled 2023-01-16: qty 10

## 2023-01-16 MED ORDER — ETOMIDATE 2 MG/ML IV SOLN
INTRAVENOUS | Status: AC
  Filled 2023-01-16: qty 20

## 2023-01-16 MED ORDER — DEXTROSE 50 % IV SOLN
12.5000 g | INTRAVENOUS | Status: AC
  Administered 2023-01-16: 12.5 g via INTRAVENOUS

## 2023-01-16 MED ORDER — DEXTROSE IN LACTATED RINGERS 5 % IV SOLN: INTRAVENOUS | Status: DC

## 2023-01-16 MED ORDER — SODIUM PHOSPHATES 45 MMOLE/15ML IV SOLN
15.0000 mmol | Freq: Once | INTRAVENOUS | Status: AC
  Administered 2023-01-16: 15 mmol via INTRAVENOUS
  Filled 2023-01-16: qty 5

## 2023-01-16 MED ORDER — ACETAMINOPHEN 10 MG/ML IV SOLN
1000.0000 mg | Freq: Four times a day (QID) | INTRAVENOUS | Status: AC
  Administered 2023-01-16 – 2023-01-17 (×4): 1000 mg via INTRAVENOUS
  Filled 2023-01-16 (×5): qty 100

## 2023-01-16 MED ORDER — ETOMIDATE 2 MG/ML IV SOLN
20.0000 mg | INTRAVENOUS | Status: AC
  Administered 2023-01-16: 20 mg via INTRAVENOUS

## 2023-01-16 MED ORDER — PROPOFOL 1000 MG/100ML IV EMUL
5.0000 ug/kg/min | INTRAVENOUS | Status: DC
  Administered 2023-01-16: 20 ug/kg/min via INTRAVENOUS
  Administered 2023-01-17 (×2): 25 ug/kg/min via INTRAVENOUS
  Administered 2023-01-17: 15 ug/kg/min via INTRAVENOUS
  Administered 2023-01-18 – 2023-01-19 (×3): 25 ug/kg/min via INTRAVENOUS
  Filled 2023-01-16 (×7): qty 100

## 2023-01-16 MED ORDER — DEXTROSE 50 % IV SOLN
INTRAVENOUS | Status: AC
  Filled 2023-01-16: qty 50

## 2023-01-16 MED ORDER — LACTATED RINGERS IV SOLN: INTRAVENOUS | Status: DC

## 2023-01-16 MED ORDER — ATROPINE SULFATE 1 MG/10ML IJ SOSY
PREFILLED_SYRINGE | INTRAMUSCULAR | Status: AC
  Filled 2023-01-16: qty 10

## 2023-01-16 MED ORDER — FENTANYL CITRATE PF 50 MCG/ML IJ SOSY: 100.0000 ug | PREFILLED_SYRINGE | INTRAMUSCULAR | Status: AC

## 2023-01-16 MED ORDER — ROCURONIUM BROMIDE 10 MG/ML (PF) SYRINGE
PREFILLED_SYRINGE | INTRAVENOUS | Status: AC
  Administered 2023-01-16: 50 mg via INTRAVENOUS
  Filled 2023-01-16: qty 10

## 2023-01-16 MED ORDER — ROCURONIUM BROMIDE 50 MG/5ML IV SOLN: 50.0000 mg | INTRAVENOUS | Status: AC

## 2023-01-16 MED ORDER — SODIUM CHLORIDE 0.9 % IV SOLN
2.0000 g | INTRAVENOUS | Status: DC
  Administered 2023-01-16: 2 g via INTRAVENOUS
  Filled 2023-01-16: qty 20

## 2023-01-16 MED ORDER — KETAMINE HCL 50 MG/5ML IJ SOSY
PREFILLED_SYRINGE | INTRAMUSCULAR | Status: AC
  Filled 2023-01-16: qty 10

## 2023-01-16 MED ORDER — HYDROMORPHONE HCL 1 MG/ML IJ SOLN
0.5000 mg | Freq: Once | INTRAMUSCULAR | Status: AC
  Administered 2023-01-16: 0.5 mg via INTRAVENOUS
  Filled 2023-01-16: qty 0.5

## 2023-01-16 MED ORDER — DOPAMINE-DEXTROSE 3.2-5 MG/ML-% IV SOLN
2.5000 ug/kg/min | INTRAVENOUS | Status: DC
  Administered 2023-01-16: 2.5 ug/kg/min via INTRAVENOUS
  Filled 2023-01-16: qty 250

## 2023-01-16 MED ORDER — FENTANYL 2500MCG IN NS 250ML (10MCG/ML) PREMIX INFUSION
25.0000 ug/h | INTRAVENOUS | Status: DC
  Administered 2023-01-16: 50 ug/h via INTRAVENOUS
  Administered 2023-01-18: 75 ug/h via INTRAVENOUS
  Filled 2023-01-16 (×2): qty 250

## 2023-01-16 MED ORDER — MIDAZOLAM HCL 2 MG/2ML IJ SOLN: 2.0000 mg | INTRAMUSCULAR | Status: AC

## 2023-01-16 MED ORDER — MIDAZOLAM HCL 2 MG/2ML IJ SOLN
INTRAMUSCULAR | Status: AC
  Administered 2023-01-16: 2 mg via INTRAVENOUS
  Filled 2023-01-16: qty 2

## 2023-01-16 MED ORDER — FENTANYL CITRATE PF 50 MCG/ML IJ SOSY
PREFILLED_SYRINGE | INTRAMUSCULAR | Status: AC
  Administered 2023-01-16: 100 ug via INTRAVENOUS
  Filled 2023-01-16: qty 2

## 2023-01-16 MED ORDER — HYDROMORPHONE HCL 1 MG/ML IJ SOLN
0.5000 mg | INTRAMUSCULAR | Status: DC | PRN
  Administered 2023-01-16 – 2023-01-25 (×19): 0.5 mg via INTRAVENOUS
  Filled 2023-01-16 (×21): qty 0.5

## 2023-01-16 NOTE — Progress Notes (Signed)
PCCM:  Called to bedside. In respiratory failure.  I spoke with family.  Decision made for reintubation   Garner Nash, DO [facility] 01/16/2023 3:08 PM

## 2023-01-16 NOTE — Consult Note (Signed)
Palliative Care Consult Note                                  Date: 01/16/2023   Patient Name: Curtis Ramos  DOB: March 08, 1932  MRN: [id number]  Age / Sex: 89 y.o., male  PCP: Leonides Sake, MD Referring Physician: Candee Furbish, MD  Reason for Consultation: Establishing goals of care  HPI/Patient Profile: Palliative Care consult requested for goals of care discussion in this 89 y.o. male  with past medical history of atrial fibrillation (Eliquis), GERD, hypothyroidism, thyroid cancer, hypertension, systolic heart failure with severe LV dilation, EF 17%, LBBB as managed by Dr. Haroldine Laws, MAC.  He was admitted on 01/13/2023 after being found down at work where he fell between the radiator and the wall.  At that time he was unresponsive.  Patient suffered PEA arrest undergoing CPR for 9 minutes before ROSC while in the ED.  Initially intubated however successfully extubated on 3/3 to BiPAP.    Past Medical History:  Diagnosis Date   Acute on chronic combined systolic and diastolic CHF (congestive heart failure) ([place]) 03/19/2019   AKI (acute kidney injury) ([place]) 12/26/2014   Arthralgia 11/04/2014   Arthritis    "proabably"   Atypical mycobacterium infection    Cancer [facility])    family unaware of this hx on [id number]   Chronic systolic heart failure (HCC)    Decreased oral intake 12/26/2014   Decreased oral intake 12/26/2014   Depressed left ventricular ejection fraction 02/05/2020   Dilated cardiomyopathy ([place]) 03/19/2019   Ejection fraction 20 to 25% based on echocardiogram done by pulmonologist month ago.   DJD (degenerative joint disease) 10/07/2014   Dyspnea on exertion 03/19/2019   Essential hypertension 02/05/2020   GERD (gastroesophageal reflux disease)    Hammer toes of both feet 01/30/2018   History of thyroid cancer 12/16/2016   Hypercalcemia 12/26/2014   Hypothyroidism    Hypothyroidism associated with surgical procedure 10/07/2014    Lung mass    Myalgia 11/04/2014   Mycobacterium avium complex ([place])    Nail dystrophy 01/30/2018   Occult malignancy (HCC)    PAF (paroxysmal atrial fibrillation) ([place]) 02/05/2020   Persistent atrial fibrillation ([place]) 03/19/2019   Chads 2 Vascor equals 3   PONV (postoperative nausea and vomiting)    Pre-ulcerative calluses 01/30/2018   Pseudoaneurysm following procedure ([place]) 04/16/2020   Sensorineural hearing loss (SNHL), bilateral 08/11/2018   Shingles    Toenail fungus 01/30/2018   Wears glasses      Subjective:   This NP Osborne Oman reviewed medical records, received report from team, assessed the patient and then met at the patient's bedside with patient's son [name] to discuss diagnosis, prognosis, GOC, EOL wishes disposition and options.  Mr. Linville recently extubated to BiPAP. Is agitated at the time of my visit. Attempting to remove BiPAP and expressing wishes to get up out of the bed. Easily redirected by son's presence and touch. Warm blanket applied and son begin playing gospel music allowing for immediate calmness.    Concept of Palliative Care was introduced as specialized medical care for people and their families living with serious illness.  It focuses on providing relief from the symptoms and stress of a serious illness.  The goal is to improve quality of life for both the patient and the family. Values and goals of care important to patient and family were attempted to be elicited.  I created space and opportunity for family to explore state of health prior to admission, thoughts, and feelings.   [name] shares patient lives in the home alone. His wife of more than 50+ years has dementia and resides in a memory care facility. They have 4 children. 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 Originally from [place]. Christian faith. Enjoys leading church choir and playing the piano. Son shares he loves music.   Prior to admission patient was  independent of all ADLs. Active in the home, continued to manage his business, and active in his local church. [name] shares if patient is unable to be active his quality of life will be minimal as he has always been a man on the go.   We discussed His current illness and what it means in the larger context of His on-going co-morbidities. Natural disease trajectory and expectations were discussed.  Family verbalized understanding of current illness and co-morbidities. Son speaks to family's beliefs that their father has already encountered a miracle as they were told he had a 20% chance of survival and he is still alive and off of the ventilator. They are aware that his condition is still tenuous however is relying on their strong faith.   Family would want to make the best decisions on behalf of their father and his quality of life. If he was not able to return to baseline or close to baseline [name] feels their discussions would need to focus on what his father would want in that situation. They do not want to make decisions prematurely and the goal is to take things one day at a time receiving open and honest updates allowing them to make decisions appropriately. If patient does not do well off of the ventilator [name] shares family are in agreement for reintubation allowing times for outcomes or complex decisions pending on status. At this time they are remaining hopeful sharing his father is "strong as an ox!"   I discussed the importance of continued conversation with family and their medical providers regarding overall plan of care and treatment options, ensuring decisions are within the context of the patients values and GOCs.  Questions and concerns were addressed. The family was encouraged to call with questions or concerns.  PMT will continue to support holistically as needed.  Objective:   Primary Diagnoses: Present on Admission: **None**   Scheduled Meds:  atropine       Chlorhexidine  Gluconate Cloth  6 each Topical Daily   docusate  100 mg Per Tube BID   feeding supplement (PROSource TF20)  60 mL Per Tube Daily   heparin  5,000 Units Subcutaneous Q8H   hydrocortisone sod succinate (SOLU-CORTEF) inj  100 mg Intravenous Q8H    HYDROmorphone (DILAUDID) injection  0.5 mg Intravenous Once   insulin aspart  0-9 Units Subcutaneous Q4H   levothyroxine  175 mcg Per Tube Daily   mouth rinse  15 mL Mouth Rinse Q2H   pantoprazole (PROTONIX) IV  40 mg Intravenous QHS   polyethylene glycol  17 g Per Tube Daily   thiamine  100 mg Per Tube Daily    Continuous Infusions:  sodium chloride Stopped (01/15/23 1359)   acetaminophen 1,000 mg (01/16/23 1134)   cefTRIAXone (ROCEPHIN)  IV     dexmedetomidine (PRECEDEX) IV infusion Stopped (01/15/23 0843)   dextrose 5% lactated ringers     feeding supplement (OSMOLITE 1.5 CAL) 55 mL/hr at 01/16/23 1100   norepinephrine (  LEVOPHED) Adult infusion 3 mcg/min (01/16/23 1100)   sodium phosphate 15 mmol in dextrose 5 % 250 mL infusion      PRN Meds: atropine, HYDROmorphone (DILAUDID) injection, mouth rinse  No Known Allergies  Review of Systems  Unable to perform ROS: Acuity of condition    Physical Exam General: NAD, frail chronically-ill appearing Cardiovascular: Irregular  Pulmonary: BiPaP in place, coarse   Abdomen: soft, nontender, + bowel sounds Extremities: no edema, no joint deformities Skin: no rashes, cool, dry  Neurological: Some agitation with BiPAP  Vital Signs:  BP (!) 133/35   Pulse 65   Temp 98.3 F (36.8 C) (Axillary)   Resp (!) 22   Ht '5\' 11"'$  (1.803 m)   Wt 59 kg   SpO2 98%   BMI 18.14 kg/m  Pain Scale: CPOT   Pain Score: 0-No pain  SpO2: SpO2: 98 % O2 Device:SpO2: 98 % O2 Flow Rate: .   IO: Intake/output summary:  Intake/Output Summary (Last 24 hours) at 01/16/2023 1209 Last data filed at 01/16/2023 1100 Gross per 24 hour  Intake 2346.37 ml  Output 1225 ml  Net 1121.37 ml    LBM: Last BM Date :  01/15/23 Baseline Weight: Weight: 62.4 kg Most recent weight: Weight: 59 kg      Palliative Assessment/Data:    Advanced Care Planning:   Primary Decision Maker: NEXT OF KIN-Children   Code Status/Advance Care Planning: Full code  Assessment & Plan:   SUMMARY OF RECOMMENDATIONS   Continue with current plan of care. Extensive goals of care discussions. Family clear in expressed wishes to continue to treat the treatable aggressively allowing patient every opportunity to continue to thrive.  They are open to reintubation if needed to allow for outcomes and complex decisions as needed.  Family is remaining hopeful.  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 PMT will continue to support and follow. Please secure chat with urgent needs.  Symptom Management:  Per Attending  Palliative Prophylaxis:  Aspiration, Delirium Protocol, Frequent Pain Assessment, Turn Reposition, and respiratory support.  Additional Recommendations (Limitations, Scope, Preferences): Full Scope Treatment  Psycho-social/Spiritual:  Desire for further Chaplaincy support: no Additional Recommendations:  Ongoing goals of care discussions and support.  Prognosis:  Guarded-Poor  Discharge Planning:  To Be Determined    Family expressed understanding and was in agreement with this plan.   Time Total: 75 min   Visit consisted of counseling and education dealing with the complex and emotionally intense issues of symptom management and palliative care in the setting of serious and potentially life-threatening illness.Greater than 50%  of this time was spent counseling and coordinating care related to the above assessment and plan.  Signed by:  Alda Lea, AGPCNP-BC [place]   Phone: [phone] Pager:  [phone] Amion: Bjorn Pippin   Thank you for allowing the Palliative Medicine Team to assist in the care of this patient. Please utilize secure chat with additional questions, if there is no response within 30 minutes please call the above phone number. Palliative Medicine Team providers are available by phone from 7am to 5pm daily and can be reached through the team cell phone.  Should this patient require assistance outside of these hours, please call the patient's attending physician.  *Please note that this is a verbal dictation  therefore any spelling or grammatical errors are due to the "[place] [facility]" system interpretation.

## 2023-01-16 NOTE — Progress Notes (Signed)
Reached out to [facility] MD pertaining HR sustaining in the lower 50's. Okay per Dr. Jeannene Patella, goal of HR > 45 as long as BP within order parameters.   Jackalyn Lombard

## 2023-01-16 NOTE — Progress Notes (Signed)
Advanced Heart Failure Rounding Note  PCP-Cardiologist: None   Subjective:   2/29 Admitted PEA. CPR ~ 9 min 4 rounds of epi. Intubated and placed on pressors.   Extubated this am but struggling due to sternal/rib fractures. Now on bipap at 80%  On NE 6. CVP 3 Unasyn for aspiration PNA  Follows commands   Objective:   Weight Range: 59 kg Body mass index is 18.14 kg/m.   Vital Signs:   Temp:  [97.3 F (36.3 C)-99.5 F (37.5 C)] 97.3 F (36.3 C) (03/03 0751) Pulse Rate:  [51-77] 77 (03/03 0911) Resp:  [15-32] 15 (03/03 0911) BP: (95-126)/(51-69) 113/56 (03/03 0400) SpO2:  [95 %-100 %] 95 % (03/03 0911) Arterial Line BP: (93-143)/(44-83) 107/47 (03/03 0600) FiO2 (%):  [40 %-80 %] 80 % (03/03 0911) Weight:  [59 kg] 59 kg (03/03 0314) Last BM Date : 01/14/23  Weight change: Filed Weights   01/14/23 0500 01/15/23 0102 01/16/23 0314  Weight: 62.4 kg 64.6 kg 59 kg    Intake/Output:   Intake/Output Summary (Last 24 hours) at 01/16/2023 0948 Last data filed at 01/16/2023 0600 Gross per 24 hour  Intake 2289.5 ml  Output 1225 ml  Net 1064.5 ml     Physical Exam   General:  Thin/elderly. Chronically-ill appearing. Agitated trying to get out of bed HEENT: normal Neck: supple. no JVD. Carotids 2+ bilat; no bruits. No lymphadenopathy or thryomegaly appreciated. Cor: PMI nondisplaced. Irregular rate & rhythm. No rubs, gallops or murmurs. Lungs: clear Abdomen: soft, nontender, nondistended. No hepatosplenomegaly. No bruits or masses. Good bowel sounds. Extremities: no cyanosis, clubbing, rash, edema Neuro: alert & orientedx3, cranial nerves grossly intact. moves all 4 extremities w/o difficulty. Affect pleasant   Telemetry   SR frequent PACs and PVCs 70-80s Personally reviewed  Labs    CBC Recent Labs    01/15/23 0400 01/16/23 0207  WBC 13.1* 15.4*  HGB 13.9 12.2*  HCT 40.1 36.3*  MCV 105.5* 108.4*  PLT 148* 143*    Basic Metabolic Panel Recent Labs     01/15/23 0400 01/15/23 1637 01/16/23 0207  NA 142  --  146*  K 3.5  --  4.0  CL 114*  --  115*  CO2 20*  --  23  GLUCOSE 116*  --  129*  BUN 32*  --  32*  CREATININE 1.44*  --  1.24  CALCIUM 9.1  --  9.7  MG 2.1 2.1 2.3  PHOS 2.3* 2.7 2.4*    Liver Function Tests Recent Labs    01/15/23 0400 01/16/23 0207  AST 44* 29  ALT 117* 81*  ALKPHOS 82 71  BILITOT 1.5* 1.5*  PROT 5.7* 5.8*  ALBUMIN 2.6* 2.6*    No results for input(s): "LIPASE", "AMYLASE" in the last 72 hours. Cardiac Enzymes Recent Labs    01/13/23 1209  CKTOTAL 205     BNP: BNP (last 3 results) Recent Labs    12/07/22 1307 12/31/22 1156 01/13/23 1344  BNP >4,500.0* 1,669.4* 1,073.1*     ProBNP (last 3 results) Recent Labs    06/18/22 1331 10/05/22 0848  PROBNP 1,795* 3,434*      D-Dimer No results for input(s): "DDIMER" in the last 72 hours. Hemoglobin A1C Recent Labs    01/13/23 1223  HGBA1C 5.9*    Fasting Lipid Panel Recent Labs    01/14/23 0333  TRIG 48    Thyroid Function Tests Recent Labs    01/13/23 1635  TSH 29.480*  Other results:   Imaging    No results found.   Medications:     Scheduled Medications:  atropine       Chlorhexidine Gluconate Cloth  6 each Topical Daily   docusate  100 mg Per Tube BID   feeding supplement (PROSource TF20)  60 mL Per Tube Daily   heparin  5,000 Units Subcutaneous Q8H   hydrocortisone sod succinate (SOLU-CORTEF) inj  100 mg Intravenous Q8H   insulin aspart  0-9 Units Subcutaneous Q4H   levothyroxine  175 mcg Per Tube Daily   mouth rinse  15 mL Mouth Rinse Q2H   pantoprazole (PROTONIX) IV  40 mg Intravenous QHS   polyethylene glycol  17 g Per Tube Daily   thiamine  100 mg Per Tube Daily    Infusions:  sodium chloride Stopped (01/15/23 1359)   ampicillin-sulbactam (UNASYN) IV 3 g (01/16/23 0825)   dexmedetomidine (PRECEDEX) IV infusion Stopped (01/15/23 0843)   dextrose     feeding supplement (OSMOLITE  1.5 CAL) 55 mL/hr at 01/16/23 0600   fentaNYL infusion INTRAVENOUS 150 mcg/hr (01/16/23 0600)   norepinephrine (LEVOPHED) Adult infusion 6 mcg/min (01/16/23 0600)   propofol (DIPRIVAN) infusion 15 mcg/kg/min (01/16/23 0600)    PRN Medications: atropine, dextrose, fentaNYL, midazolam, mouth rinse    Patient Profile  Curtis Ramos is a 89 year old with a history of HFrEF, PVCs, LBBB, MAI infection, GERD, HTN, hypothyroidism, and thyroid cancer.   Admitted  PEA arrest/shock. CPR  ~ 9 min -->nondisplaced transverse fracture of mid-lower sternal body, acute fx of costal cartilage of bilateral 2nd-6th ribs + 7th costal cartilage, acute fx of anterior 2nd-7th ribs.    Assessment/Plan   1. Cardiac Arrest--> Shock --> cardiogenic shock - PEA arrest--> 9 min CPR 4 rounds epi. Unshockable rhythm.  - Rib fracture 2-7 ribs  - Off epi. NE 6 Co-ox 89% Wean NE as able - CVP 5.    2. A/C HFrEF, NICM -->Cardiogenic shock - Echo 5/20 EF 20-25% - Echo 12/21 EF 55-60% - Echo  11/23   EF 20-25% LV markedly dilated + dyssynchrony - RV ok  Moderate MR/TR severe biatrial enlargement  - Cath 2012: No CAD (cath not repeated as low suspicion for iCM and CKD 3b) - cMRI 2/24: LBBB, LV EF 17%. RV EF 33%. Severe LAEt. No LGE.  - Zio 2/24  AFL 100% avg rat 67 PVC 1.8% - As above, Off epi. NE 6. CVP 3 Co-ox 89% Wean NE as able - Holding diuretics  - GDMT on hold while on pressors.    3. Acute Hypoxic Respiratory Failure in setting of cardiac arrest and aspiration PNA/sternal fractures - Extubated this am (3/3) - Watch closely. Struggling with loss of PEEP in setting of sternal fractures - Continue abx (unasyn has high salt load watch fluid status) - D/w CCM and family. Would want re-intubation currently if needed. I have suggested avoiding any further CPR in case of recurrent arrest but they want to think about it  4. Chronic AF and frequent PVCs - has been on amio as outpatient. Held here - Currently  sinus with PACs and PVCs - off AC with fractures  5. LBBB - was being worked up for CRT  6. Rib/sternal fractures + scalp hematoma - due to CPR.  -CT - R scalp hematoma. No acute intracranial abnormality.     7. AKI due to ATN/shock - Creatinine baseline~ 1.6 - Today creatinine 2.16 -> 1.44 -> 1,24  8. Hypothyroidism  -  H/O Thyroid cancer. TSH 29 T4 1.26 - Suspect in the setting of acute event.   9. Shock liver - due to arrest -> improving  10. Hypernatremia - Na 146. CVP 3 - Will give gentle IVF x 12 hours  CRITICAL CARE Performed by: Glori Bickers  Total critical care time: 50 minutes  Critical care time was exclusive of separately billable procedures and treating other patients.  Critical care was necessary to treat or prevent imminent or life-threatening deterioration.  Critical care was time spent personally by me (independent of midlevel providers or residents) on the following activities: development of treatment plan with patient and/or surrogate as well as nursing, discussions with consultants, evaluation of patient's response to treatment, examination of patient, obtaining history from patient or surrogate, ordering and performing treatments and interventions, ordering and review of laboratory studies, ordering and review of radiographic studies, pulse oximetry and re-evaluation of patient's condition.   Length of Stay: [place], MD  01/16/2023, 9:48 AM  Advanced Heart Failure Team Pager [phone] (M-F; 7a - 5p)  Please contact [facility] for night-coverage after hours (5p -7a ) and weekends on [email or web address]

## 2023-01-16 NOTE — Progress Notes (Signed)
Patient with rhythm change, MD Tamala Julian  made aware and evaluated rhythm. No new ordered received.

## 2023-01-16 NOTE — Progress Notes (Signed)
Hypoglycemic Event  CBG: 68  Treatment: D50 25 mL (12.5 gm)  Symptoms: None  Follow-up CBG: [id number] CBG Result:228  Possible Reasons for Event: Unknown      Curtis Ramos

## 2023-01-16 NOTE — Progress Notes (Signed)
LTM EEG discontinued - no skin breakdown at unhook.   

## 2023-01-16 NOTE — Progress Notes (Signed)
EEG maint complete. 9AM

## 2023-01-16 NOTE — Procedures (Signed)
Extubation Procedure Note  Patient Details:   Name: BALAM MERCEDES DOB: 1932-07-03 MRN: [id number]   Airway Documentation:    Vent end date: 01/16/23 Vent end time: 0855   Evaluation  O2 sats: transiently fell during during procedure Complications: No apparent complications Patient did tolerate procedure well. Bilateral Breath Sounds: Clear, Diminished   Yes  Patient extubated per MD order. Positive cuff leak. Sats fell in the low 80's on 15L [place]. Patient placed on bipap 18/8 80%. Vitals are stable at this time. RN and MD at bedside.  Herbie Saxon Keidan Aumiller 01/16/2023, 9:13 AM

## 2023-01-16 NOTE — Progress Notes (Addendum)
eLink Physician-Brief Progress Note Patient Name: TAMEKA FRANKFORT DOB: [birth year] MRN: [id number]   Date of Service  01/16/2023  HPI/Events of Note  Notified of ABG results on PRVC 520, 50, 5, 32 --> 7.426/36.4/88.  eICU Interventions  Continue on current vent settings.      Intervention Category Minor Interventions: Other:  Elsie Lincoln 01/16/2023, 10:11 PM  12:18 AM Abdominal x-ray reviewed to confirm placement.  ETT is in the midline, likely in the distal esophagus.   Plan> Advance ETT by 10cm and repeat abdominal xray.   5:49 AM Abdominal xray reviewed after OGT advancement by 10cm as per RN.  Tip however on abdominal xray only advanced by a few centimeters.   Plan> Ok to use for medications but would need further advancement.

## 2023-01-16 NOTE — Procedures (Signed)
Patient Name: Curtis Ramos  MRN: [id number]  Epilepsy Attending: Lora Havens  Referring Physician/Provider: Jacky Kindle, MD  Duration: 01/15/2023 1159 to 01/16/2023 1405   Patient history: 89yo M s/p cardiac arrest. EEG to evaluate for seizure   Level of alertness:  comatose   AEDs during EEG study: Propofol   Technical aspects: This EEG study was done with scalp electrodes positioned according to the 10-20 International system of electrode placement. Electrical activity was reviewed with band pass filter of 1-'70Hz'$ , sensitivity of 7 uV/mm, display speed of 41m/sec with a '60Hz'$  notched filter applied as appropriate. EEG data were recorded continuously and digitally stored.  Video monitoring was available and reviewed as appropriate.   Description: EEG showed continuous generalized low amplitude 3 to 5 Hz theta-delta slowing. Hyperventilation and photic stimulation were not performed.       ABNORMALITY - Continuous slow, generalized   IMPRESSION: This study is suggestive of severe diffuse encephalopathy, nonspecific etiology. No seizures or epileptiform discharges were seen throughout the recording.  Curtis Ramos [name]

## 2023-01-16 NOTE — Progress Notes (Signed)
Reintubated due to WOB, desats despite BIPAP.  Did follow command, talk briefly after extubation, not sure MRI brain needed.  Tough conversation will be needed with family, I prepped them briefly this morning.  Appreciate PMT help.  Erskine Emery MD PCCM

## 2023-01-16 NOTE — Progress Notes (Signed)
Went to d/c EEG and pt was on Cpap and agitated, per RN it is NOT a good time to remove EEG

## 2023-01-16 NOTE — Procedures (Signed)
Intubation Procedure Note  Curtis Ramos  [id number]  [birth year]  Date:01/16/23  Time:3:07 PM   Provider Performing:Joyel Chenette L Zanyiah Posten    Procedure: Intubation (31500)  Indication(s) Respiratory Failure  Consent Risks of the procedure as well as the alternatives and risks of each were explained to the patient and/or caregiver.  Consent for the procedure was obtained and is signed in the bedside chart   Anesthesia Etomidate, Versed, Fentanyl, and Rocuronium   Time Out Verified patient identification, verified procedure, site/side was marked, verified correct patient position, special equipment/implants available, medications/allergies/relevant history reviewed, required imaging and test results available.   Sterile Technique Usual hand hygeine, masks, and gloves were used   Procedure Description Patient positioned in bed supine.  Sedation given as noted above.  Patient was intubated with endotracheal tube using Glidescope.  View was Grade 1 full glottis .  Number of attempts was 1.  Colorimetric CO2 detector was consistent with tracheal placement.   Complications/Tolerance None; patient tolerated the procedure well. Chest X-ray is ordered to verify placement.   EBL Minimal   Specimen(s) None

## 2023-01-16 NOTE — Progress Notes (Signed)
NAME:  Curtis Ramos, MRN:  [id number], DOB:  1932/09/26, LOS: 3 ADMISSION DATE:  01/13/2023, CONSULTATION DATE:  01/13/2023 REFERRING MD:  Trauma, CHIEF COMPLAINT:  Cardiac arrest, post-fall (Level 1 Trauma)   History of Present Illness:  89 year old man who presented to [facility] ED 2/29 via EMS after unwitnessed fall at work. PMHx significant for HTN, HFrEF with NICM (cMRI 12/2022 with severe LV dilation, EF 17%, LBBB, followed by Dr. Haroldine Laws), AFib (on Eliquis), MAI infection, GERD, hypothyroidism, history of thyroid CA.   History obtained from chart review and from family. Patient presented to [facility] ED via EMS after a fall at work; per family, patient did not have any complaints prior to fall. Recently undergoing workup with Cardiology for pacemaker placement. He was found down between a radiator and the wall, unresponsive with contusion and deformities to R side of his head. Initially with tachycardic with EMS, then bradycardic. ST elevation noted on EKG and Code STEMI was called, later called off as ST elevation had resolved. Patient lost pulses while in ED and became apneic with cardiac monitoring showing PEA arrest. CPR administered x 9 minutes with Epi x 4 and ROSC, patient never had a shockable rhythm. Intubated peri-arrest. Cards was consulted, noting high risk of VT/VF in the setting of severe CM. AHF was also notified of admission.   Labs demonstrated WBC 13.9, Hgb 14.1, Plt 169. INR 2.2 (on Eliquis). Na 138, K 4.1, CO2 18, Cr 1.79 (baseline ~1.3). CK 205. LA 7.0 > 4.6. Trop 27 > 155. BNP 1073. UA with glucose > 500, +protein. PCT < 0.10. CT Head NAICA, R parietal scalp hematoma. CT C-spine negative for acute findings. CT Chest/A/P with nondisplaced transverse fracture of mid-lower sternal body, acute fx of costal cartilage of bilateral 2nd-6th ribs + 7th costal cartilage, acute fx of anterior 2nd-7th ribs; pulmonary lobular interstitial thickening (?pulm edema), BLL opacities, trace ascites.  Empiric Unasyn started for ?aspiration.   PCCM was consulted for ICU admission. Pertinent  Medical History   Past Medical History:  Diagnosis Date   Acute on chronic combined systolic and diastolic CHF (congestive heart failure) ([place]) 03/19/2019   AKI (acute kidney injury) ([place]) 12/26/2014   Arthralgia 11/04/2014   Arthritis    "proabably"   Atypical mycobacterium infection    Cancer [facility])    family unaware of this hx on [id number]   Chronic systolic heart failure (HCC)    Decreased oral intake 12/26/2014   Decreased oral intake 12/26/2014   Depressed left ventricular ejection fraction 02/05/2020   Dilated cardiomyopathy ([place]) 03/19/2019   Ejection fraction 20 to 25% based on echocardiogram done by pulmonologist month ago.   DJD (degenerative joint disease) 10/07/2014   Dyspnea on exertion 03/19/2019   Essential hypertension 02/05/2020   GERD (gastroesophageal reflux disease)    Hammer toes of both feet 01/30/2018   History of thyroid cancer 12/16/2016   Hypercalcemia 12/26/2014   Hypothyroidism    Hypothyroidism associated with surgical procedure 10/07/2014   Lung mass    Myalgia 11/04/2014   Mycobacterium avium complex ([place])    Nail dystrophy 01/30/2018   Occult malignancy (HCC)    PAF (paroxysmal atrial fibrillation) ([place]) 02/05/2020   Persistent atrial fibrillation ([place]) 03/19/2019   Chads 2 Vascor equals 3   PONV (postoperative nausea and vomiting)    Pre-ulcerative calluses 01/30/2018   Pseudoaneurysm following procedure ([place]) 04/16/2020   Sensorineural hearing loss (SNHL), bilateral 08/11/2018   Shingles    Toenail fungus 01/30/2018   Wears  glasses    Significant Hospital Events: Including procedures, antibiotic start and stop dates in addition to other pertinent events   2/29 - Presented to [facility] ED after 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 Found between radiator and wall with R-sided head injury. Suspected cardiac etiology of unresponsiveness. On ED arrival,  became pulseless (rhythm PEA). CPR x 9 min, Epi x 4 with ROSC. Intubated peri-arrest. CT Head, C-Spine, Chest/A/P completed (as above). Unasyn for ?aspiration. R axillary A-line, L subclavian CVC, ETT tube exchange + bronch.   Interim History / Subjective:  Gets very agitated with sedation wean. Pressor needs now minimal.  Objective   Blood pressure (!) 113/56, pulse (!) 51, temperature (!) 97.3 F (36.3 C), resp. rate (!) 32, height '5\' 11"'$  (1.803 m), weight 59 kg, SpO2 100 %. CVP:  [1 mmHg-5 mmHg] 4 mmHg  Vent Mode: PRVC FiO2 (%):  [40 %] 40 % Set Rate:  [32 bmp] 32 bmp Vt Set:  [520 mL] 520 mL PEEP:  [5 cmH20] 5 cmH20 Plateau Pressure:  [17 cmH20-19 cmH20] 19 cmH20   Intake/Output Summary (Last 24 hours) at 01/16/2023 0726 Last data filed at 01/16/2023 0600 Gross per 24 hour  Intake 2655.35 ml  Output 1475 ml  Net 1180.35 ml    Filed Weights   01/14/23 0500 01/15/23 0102 01/16/23 0314  Weight: 62.4 kg 64.6 kg 59 kg   Examination: No distress Scattered rhonci but minimal Triggers vent Ext warm Withdraws x 4 RASS -3  Labs continue to improve  Resolved Hospital Problem list   N/A  Assessment & Plan:  Post-PEA arrest- presumed cardiac  in origin given sinus pauses while here Post arrest shock- labs/exam more c/w distributive Severe nonischemic cardiomyopathy, HFrEF, LF EV 17% Atrial fibrillation Post arrest acute hypoxemic resp failure with aspiration pneumonitis- does have hx of MAI, would not really worry about this hx given current clinical status Post arrest AKI/ATN- improved Post arrest shock liver- improved Hypothyroid- abn thyroid labs, recheck as OP once out of acute period, continue PTA synthorid Scalp hematoma after fall  - vent bundle - stress steroids until off levo then can taper - unasyn for aspiration - spoke with son, he is okay with trial of extubation to see where mental status is; if fails would like reintubation and clarification on GOC, living  will states no extraordinary measures which son interprets as prolonged life support, trach/PEG etc - Appreciate Dr. Clayborne Dana help with patient  31 min cc time Erskine Emery MD PCCM

## 2023-01-17 ENCOUNTER — Inpatient Hospital Stay (HOSPITAL_COMMUNITY): Payer: Medicare Other

## 2023-01-17 DIAGNOSIS — I469 Cardiac arrest, cause unspecified: Secondary | ICD-10-CM | POA: Diagnosis not present

## 2023-01-17 DIAGNOSIS — J9601 Acute respiratory failure with hypoxia: Secondary | ICD-10-CM | POA: Diagnosis not present

## 2023-01-17 DIAGNOSIS — Z7189 Other specified counseling: Secondary | ICD-10-CM | POA: Diagnosis not present

## 2023-01-17 DIAGNOSIS — Z515 Encounter for palliative care: Secondary | ICD-10-CM | POA: Diagnosis not present

## 2023-01-17 LAB — COOXEMETRY PANEL
Carboxyhemoglobin: 2.1 % — ABNORMAL HIGH (ref 0.5–1.5)
Carboxyhemoglobin: 2 % — ABNORMAL HIGH (ref 0.5–1.5)
Methemoglobin: 1.2 % (ref 0.0–1.5)
Methemoglobin: <0.7 % (ref 0.0–1.5)
O2 Saturation: 94.5 %
O2 Saturation: 86.4 %
Total hemoglobin: 12.4 g/dL (ref 12.0–16.0)
Total hemoglobin: 12.4 g/dL (ref 12.0–16.0)

## 2023-01-17 LAB — CBC
HCT: 35.3 % — ABNORMAL LOW (ref 39.0–52.0)
Hemoglobin: 12 g/dL — ABNORMAL LOW (ref 13.0–17.0)
MCH: 36.9 pg — ABNORMAL HIGH (ref 26.0–34.0)
MCHC: 34 g/dL (ref 30.0–36.0)
MCV: 108.6 fL — ABNORMAL HIGH (ref 80.0–100.0)
Platelets: 130 10*3/uL — ABNORMAL LOW (ref 150–400)
RBC: 3.25 MIL/uL — ABNORMAL LOW (ref 4.22–5.81)
RDW: 14.7 % (ref 11.5–15.5)
WBC: 13.3 10*3/uL — ABNORMAL HIGH (ref 4.0–10.5)
nRBC: 0 % (ref 0.0–0.2)

## 2023-01-17 LAB — COMPREHENSIVE METABOLIC PANEL
ALT: 62 U/L — ABNORMAL HIGH (ref 0–44)
AST: 24 U/L (ref 15–41)
Albumin: 2.5 g/dL — ABNORMAL LOW (ref 3.5–5.0)
Alkaline Phosphatase: 71 U/L (ref 38–126)
Anion gap: 11 (ref 5–15)
BUN: 32 mg/dL — ABNORMAL HIGH (ref 8–23)
CO2: 21 mmol/L — ABNORMAL LOW (ref 22–32)
Calcium: 10.1 mg/dL (ref 8.9–10.3)
Chloride: 117 mmol/L — ABNORMAL HIGH (ref 98–111)
Creatinine, Ser: 1.15 mg/dL (ref 0.61–1.24)
GFR, Estimated: >60 mL/min (ref >60)
Glucose, Bld: 106 mg/dL — ABNORMAL HIGH (ref 70–99)
Potassium: 3.6 mmol/L (ref 3.5–5.1)
Sodium: 149 mmol/L — ABNORMAL HIGH (ref 135–145)
Total Bilirubin: 1.4 mg/dL — ABNORMAL HIGH (ref 0.3–1.2)
Total Protein: 5.5 g/dL — ABNORMAL LOW (ref 6.5–8.1)

## 2023-01-17 LAB — GLUCOSE, CAPILLARY
Glucose-Capillary: 91 mg/dL (ref 70–99)
Glucose-Capillary: 90 mg/dL (ref 70–99)
Glucose-Capillary: 113 mg/dL — ABNORMAL HIGH (ref 70–99)
Glucose-Capillary: 102 mg/dL — ABNORMAL HIGH (ref 70–99)
Glucose-Capillary: 119 mg/dL — ABNORMAL HIGH (ref 70–99)
Glucose-Capillary: 54 mg/dL — ABNORMAL LOW (ref 70–99)
Glucose-Capillary: 84 mg/dL (ref 70–99)

## 2023-01-17 LAB — TRIGLYCERIDES: Triglycerides: 87 mg/dL (ref <150)

## 2023-01-17 LAB — PHOSPHORUS: Phosphorus: 2 mg/dL — ABNORMAL LOW (ref 2.5–4.6)

## 2023-01-17 LAB — LACTIC ACID, PLASMA: Lactic Acid, Venous: 1.7 mmol/L (ref 0.5–1.9)

## 2023-01-17 MED ORDER — SODIUM CHLORIDE 0.9 % IV SOLN
2.0000 g | Freq: Two times a day (BID) | INTRAVENOUS | Status: AC
  Administered 2023-01-17 – 2023-01-26 (×20): 2 g via INTRAVENOUS
  Filled 2023-01-17 (×20): qty 12.5

## 2023-01-17 MED ORDER — FUROSEMIDE 10 MG/ML IJ SOLN
40.0000 mg | Freq: Once | INTRAMUSCULAR | Status: AC
  Administered 2023-01-17: 40 mg via INTRAVENOUS
  Filled 2023-01-17: qty 4

## 2023-01-17 MED ORDER — FREE WATER
150.0000 mL | Freq: Four times a day (QID) | Status: DC
  Administered 2023-01-17 – 2023-01-18 (×4): 150 mL

## 2023-01-17 MED ORDER — POTASSIUM PHOSPHATES 15 MMOLE/5ML IV SOLN
15.0000 mmol | Freq: Once | INTRAVENOUS | Status: AC
  Administered 2023-01-17: 15 mmol via INTRAVENOUS
  Filled 2023-01-17: qty 5

## 2023-01-17 MED ORDER — POTASSIUM CHLORIDE 20 MEQ PO PACK
40.0000 meq | PACK | Freq: Once | ORAL | Status: AC
  Administered 2023-01-17: 40 meq
  Filled 2023-01-17: qty 2

## 2023-01-17 MED ORDER — BLISTEX MEDICATED EX OINT
TOPICAL_OINTMENT | CUTANEOUS | Status: DC | PRN
  Filled 2023-01-17: qty 6.3

## 2023-01-17 NOTE — Progress Notes (Signed)
Palliative: Curtis Ramos is lying quietly in bed.  He is intubated/ventilated and lightly sedated.  He is interacting with wife and family effectively and appropriately.  His son/HCPOA, Curtis Ramos, is present at bedside.  Overall, Curtis Ramos is quite knowledgeable about his father's acute and chronic health concerns.  Bedside nursing staff is present attending to needs.  We talk about the treatment plan and time for outcomes.  At this point, Curtis Ramos shares that family would want continued full scope treatment as long as Curtis Ramos is showing improvements.  I shared that the palliative team will follow along for support.  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  Conference with attending resident, bedside nursing staff related to patient condition, needs, goals of care.  Plan: Continue full scope/full code.  Time for outcomes.  37 minutes  Curtis Axe, NP Palliative medicine team Team phone [phone]. Greater than 50% of this time was spent counseling and coordinating care related to the above assessment and plan.

## 2023-01-17 NOTE — Progress Notes (Addendum)
NAME:  Curtis Ramos, MRN:  [id number], DOB:  07/22/1932, LOS: 4 ADMISSION DATE:  01/13/2023, CONSULTATION DATE:  01/13/2023 REFERRING MD:  Trauma, CHIEF COMPLAINT:  Cardiac arrest, post-fall (Level 1 Trauma)   History of Present Illness:  89 year old man who presented to [facility] ED 2/29 via EMS after unwitnessed fall at work. PMHx significant for HTN, HFrEF with NICM (cMRI 12/2022 with severe LV dilation, EF 17%, LBBB, followed by Dr. Haroldine Laws), AFib (on Eliquis), MAI infection, GERD, hypothyroidism, history of thyroid CA.   History obtained from chart review and from family. Patient presented to [facility] ED via EMS after a fall at work; per family, patient did not have any complaints prior to fall. Recently undergoing workup with Cardiology for pacemaker placement. He was found down between a radiator and the wall, unresponsive with contusion and deformities to R side of his head. Initially with tachycardic with EMS, then bradycardic. ST elevation noted on EKG and Code STEMI was called, later called off as ST elevation had resolved. Patient lost pulses while in ED and became apneic with cardiac monitoring showing PEA arrest. CPR administered x 9 minutes with Epi x 4 and ROSC, patient never had a shockable rhythm. Intubated peri-arrest. Cards was consulted, noting high risk of VT/VF in the setting of severe CM. AHF was also notified of admission.   Labs demonstrated WBC 13.9, Hgb 14.1, Plt 169. INR 2.2 (on Eliquis). Na 138, K 4.1, CO2 18, Cr 1.79 (baseline ~1.3). CK 205. LA 7.0 > 4.6. Trop 27 > 155. BNP 1073. UA with glucose > 500, +protein. PCT < 0.10. CT Head NAICA, R parietal scalp hematoma. CT C-spine negative for acute findings. CT Chest/A/P with nondisplaced transverse fracture of mid-lower sternal body, acute fx of costal cartilage of bilateral 2nd-6th ribs + 7th costal cartilage, acute fx of anterior 2nd-7th ribs; pulmonary lobular interstitial thickening (?pulm edema), BLL opacities, trace ascites.  Empiric Unasyn started for ?aspiration.   PCCM was consulted for ICU admission. Pertinent  Medical History   Past Medical History:  Diagnosis Date   Acute on chronic combined systolic and diastolic CHF (congestive heart failure) ([place]) 03/19/2019   AKI (acute kidney injury) ([place]) 12/26/2014   Arthralgia 11/04/2014   Arthritis    "proabably"   Atypical mycobacterium infection    Cancer [facility])    family unaware of this hx on [id number]   Chronic systolic heart failure (HCC)    Decreased oral intake 12/26/2014   Decreased oral intake 12/26/2014   Depressed left ventricular ejection fraction 02/05/2020   Dilated cardiomyopathy ([place]) 03/19/2019   Ejection fraction 20 to 25% based on echocardiogram done by pulmonologist month ago.   DJD (degenerative joint disease) 10/07/2014   Dyspnea on exertion 03/19/2019   Essential hypertension 02/05/2020   GERD (gastroesophageal reflux disease)    Hammer toes of both feet 01/30/2018   History of thyroid cancer 12/16/2016   Hypercalcemia 12/26/2014   Hypothyroidism    Hypothyroidism associated with surgical procedure 10/07/2014   Lung mass    Myalgia 11/04/2014   Mycobacterium avium complex ([place])    Nail dystrophy 01/30/2018   Occult malignancy (HCC)    PAF (paroxysmal atrial fibrillation) ([place]) 02/05/2020   Persistent atrial fibrillation ([place]) 03/19/2019   Chads 2 Vascor equals 3   PONV (postoperative nausea and vomiting)    Pre-ulcerative calluses 01/30/2018   Pseudoaneurysm following procedure ([place]) 04/16/2020   Sensorineural hearing loss (SNHL), bilateral 08/11/2018   Shingles    Toenail fungus 01/30/2018   Wears  glasses    Significant Hospital Events: Including procedures, antibiotic start and stop dates in addition to other pertinent events   2/29 - Presented to [facility] ED after 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 Found between radiator and wall with R-sided head injury. Suspected cardiac etiology of unresponsiveness. On ED arrival,  became pulseless (rhythm PEA). CPR x 9 min, Epi x 4 with ROSC. Intubated peri-arrest. CT Head, C-Spine, Chest/A/P completed (as above). Unasyn for ?aspiration. R axillary A-line, L subclavian CVC, ETT tube exchange + bronch.  3/3 - Extubated, back into respirator failure, reintubated 3/3 - OG tube placement complications, completely replaced, still trouble with placement  Interim History / Subjective:  Patient easily wakes and is interactive this morning. He has not really able to give any information on general complaints.   Objective   Blood pressure (!) 125/59, pulse 65, temperature 98.3 F (36.8 C), temperature source Oral, resp. rate (!) 32, height '5\' 11"'$  (1.803 m), weight 54.1 kg, SpO2 100 %. CVP:  [2 mmHg-3 mmHg] 2 mmHg  Vent Mode: PRVC FiO2 (%):  [40 %-100 %] 40 % Set Rate:  [14 bmp-32 bmp] 32 bmp Vt Set:  [520 mL-600 mL] 600 mL PEEP:  [5 cmH20-8 cmH20] 5 cmH20 Plateau Pressure:  [18 cmH20-22 cmH20] 18 cmH20   Intake/Output Summary (Last 24 hours) at 01/17/2023 0712 Last data filed at 01/17/2023 0600 Gross per 24 hour  Intake 2445.97 ml  Output 1150 ml  Net 1295.97 ml    Filed Weights   01/15/23 0102 01/16/23 0314 01/17/23 0319  Weight: 64.6 kg 59 kg 54.1 kg   Examination: Constitutional:Ill-appearing elderly gentleman. In no acute distress. Cardio:Regular rate with irregular rhythm. No murmurs, rubs, or gallops. Pulm:Clear to auscultation bilaterally with exception of questionable crackles over L lung base on anterior auscultation. On full ventilatory support. Abdomen:Soft, nontender, non-distended. [id number] for extremity edema. Hematoma over medial epicondyle on R elbow, without increased warmth compared to L or tenderness. Skin:Warm and dry. Neuro:Opens eyes to voice. Follows commands, nods yes or shakes head no to simple questions. Shrugs shoulders.  Resolved Hospital Problem list   AKI Assessment & Plan:  Post-PEA arrest Distributive shock Trial of extubation  03/03 ultimately required re-intubation due to recurrence of respiratory failure. Co-ox panel with O2 saturation 94.5% this morning, has been consistently elevated. Concern since presentation for sepsis. Lactic acid has not been assessed in several days. Remains intubated after failed extubation trial, on vasopressor (dopamine, norepinephrine) to maintain MAP >65. Stress steroids initiated 03/02 in attempt to decrease vasopressor needs. Plan: -Continue normothermia protocol -Titrate norepinephrine and dopamine as tolerated; MAP goal >65 -D/c stress steroids -Cautious IVF resuscitation if needed -Trend WBC, fever curve, co-ox, LA   Acute hypoxemic respiratory failure Pneumonia Leukocytosis Thrombocytopenia Of note, has history of MAI infection. Currently AF, Leukocytosis 13.3, platelets 130--likely reactive. Initial respiratory culture showed normal respiratory flora. Repeat 03/03 showed few GNR, rare GPC in pairs, few yeast with pseudohyphae, rare squamous epithelial cells. Was previously on unasyn which was changed to ceftriaxone 03/03. Latest CXR 03/03 showed worsened pulmonary edema and possible superimposed multifocal pneumonia with new small R pleural effusion. Failed extubation 03/03, seems to be struggling in part due to sternal and rib fractures obtained during CPR. Plan: -Continue ceftriaxone -IV lasix 40 mg x1, can give additional dose to meet net neutral fluid status for today -Repeat CXR 03/05 -Follow respiratory culture -Continue full ventilator support; wean FiO2 for O2 sat > 90% -Daily SBT as tolerated -  VAP bundle -Pulmonary hygiene -PAD protocol for sedation: propofol and fentanyl for goal RASS -1 to -2 -Follow respiratory culture  Severe nonischemic cardiomyopathy HFrEF, LF EV 17% Atrial fibrillation cMRI 12/2022 with severe LV dilation, EF 17%, LBBB. Home regimen is carvedilol, farxiga, entresto, lasix. He is on Eliquis and amiodarone for atrial fibrillation. Cardiology  (Dr. Haroldine Laws) following. Echocardiogram 03/01 showed LV EF 20% with severely decreased function, regional wall motion abnormalities, mildly dilated internal LV cavity. He does have increased pulmonary edema on CXR 03/03. Plan: -Advanced HF team following -Continue cardiac monitoring, CVP -Continue to hold amiodarone in setting of regular rate -Continue to hold GDMT in setting of shock -Ongoing GOC conversations with family   Acute encephalopathy, multifactorial Is easily agitated when sedating medications are weaned. During brief extubation yesterday he was able to talk some and follow commands. Long-term EEG showed severe diffuse encephalopathy with nonspecific etiology, no seizures or epileptiform discharges throughout recording. Today he is interactive during exam and opens his eyes to voice, shrugs shoulders, shakes head yes or no, shrugs shoulders to basic questions, moves feet when asked. Plan: -Frequent neuro checks -Low threshold for repeat CT if changes in neuro exam -Neuroprotective measures: HOB > 30 degrees, normoglycemia, normothermia, electrolytes WNL   Elevated liver enzymes Likely shock liver in setting of PEA arrest. Improving. AST 24, ALT 63, total bilirubin 1.4. Plan: -Trend CMP  Hx hypothyroidism Plan: -Continue home levothyroxine 175 mcg daily per tube -Recommend repeat labs as outpatient once acute illness has resolved   Nutrition needs Plan: -CorTrak today, restart tube feeds after placement -D/c D5-LR -SSI, CBG monitoring; goal CBG 140-180  Scalp hematoma Rib fractures Sternum fracture Sustained during CPR.   Best Practice (right click and "Reselect all SmartList Selections" daily)   Diet/type: tubefeeds DVT prophylaxis: prophylactic heparin  GI prophylaxis: PPI Lines: Central line and Arterial Line Foley:  Yes, and it is still needed Code Status:  full code Last date of multidisciplinary goals of care discussion [02/29]  Labs   CBC: Recent  Labs  Lab 01/13/23 1223 01/13/23 1341 01/14/23 0333 01/14/23 0432 01/15/23 0400 01/16/23 0207 01/16/23 1023 01/16/23 1206 01/16/23 2058 01/17/23 0318  WBC 13.9*  --  15.9*  --  13.1* 15.4*  --   --   --  13.3*  HGB 14.1  15.3   < > 15.9   < > 13.9 12.2* 11.9* 11.6* 11.9* 12.0*  HCT 42.6  45.0   < > 47.6   < > 40.1 36.3* 35.0* 34.0* 35.0* 35.3*  MCV 114.2*  --  108.2*  --  105.5* 108.4*  --   --   --  108.6*  PLT 169  --  205  --  148* 143*  --   --   --  130*   < > = values in this interval not displayed.     Basic Metabolic Panel: Recent Labs  Lab 01/13/23 1209 01/13/23 1223 01/13/23 1341 01/14/23 0333 01/14/23 0432 01/14/23 1638 01/15/23 0400 01/15/23 1637 01/16/23 0207 01/16/23 1023 01/16/23 1206 01/16/23 2058 01/17/23 0318  NA 138 138   < > 137   < >  --  142  --  146* 149* 149* 151* 149*  K 4.1 4.6   < > 3.9   < >  --  3.5  --  4.0 4.0 3.9 3.8 3.6  CL 106 107  --  104  --   --  114*  --  115*  --   --   --  117*  CO2 18*  --   --  18*  --   --  20*  --  23  --   --   --  21*  GLUCOSE 187* 158*  --  135*  --   --  116*  --  129*  --   --   --  106*  BUN 31* 41*  --  37*  --   --  32*  --  32*  --   --   --  32*  CREATININE 1.79* 2.00*  --  2.16*  --   --  1.44*  --  1.24  --   --   --  1.15  CALCIUM 8.3*  --   --  8.8*  --   --  9.1  --  9.7  --   --   --  10.1  MG  --   --   --  2.2  --  2.2 2.1 2.1 2.3  --   --   --   --   PHOS  --   --    < > 3.8  --  3.3 2.3* 2.7 2.4*  --   --   --  2.0*   < > = values in this interval not displayed.    GFR: Estimated Creatinine Clearance: 34 mL/min (by C-G formula based on SCr of 1.15 mg/dL). Recent Labs  Lab 01/13/23 1344 01/13/23 1635 01/14/23 0333 01/14/23 0821 01/14/23 1320 01/15/23 0400 01/16/23 0207 01/17/23 0318  PROCALCITON <0.10  --   --   --   --   --   --   --   WBC  --   --  15.9*  --   --  13.1* 15.4* 13.3*  LATICACIDVEN 4.6* 2.9*  --  3.6* 5.0*  --   --   --      Liver Function  Tests: Recent Labs  Lab 01/13/23 1209 01/14/23 0333 01/15/23 0400 01/16/23 0207 01/17/23 0318  AST 85* 191* 44* 29 24  ALT 48* 228* 117* 81* 62*  ALKPHOS 97 111 82 71 71  BILITOT 0.7 1.2 1.5* 1.5* 1.4*  PROT 6.0* 6.2* 5.7* 5.8* 5.5*  ALBUMIN 3.1* 3.1* 2.6* 2.6* 2.5*    No results for input(s): "LIPASE", "AMYLASE" in the last 168 hours. No results for input(s): "AMMONIA" in the last 168 hours.  ABG    Component Value Date/Time   PHART 7.426 01/16/2023 2058   PCO2ART 36.4 01/16/2023 2058   PO2ART 88 01/16/2023 2058   HCO3 23.9 01/16/2023 2058   TCO2 25 01/16/2023 2058   ACIDBASEDEF 4.0 (H) 01/16/2023 1206   O2SAT 94.5 01/17/2023 0319     Coagulation Profile: Recent Labs  Lab 01/13/23 1223  INR 2.2*     Cardiac Enzymes: Recent Labs  Lab 01/13/23 1209  CKTOTAL 205     HbA1C: Hgb A1c MFr Bld  Date/Time Value Ref Range Status  01/13/2023 12:23 PM 5.9 (H) 4.8 - 5.6 % Final    Comment:    (NOTE)         Prediabetes: 5.7 - 6.4         Diabetes: >6.4         Glycemic control for adults with diabetes: <7.0     CBG: Recent Labs  Lab 01/16/23 1116 01/16/23 1521 01/16/23 1926 01/16/23 2312 01/17/23 0315  GLUCAP 96 144* 142* 97 91     Past Medical History:  He,  has a past medical  history of Acute on chronic combined systolic and diastolic CHF (congestive heart failure) ([place]) (03/19/2019), AKI (acute kidney injury) ([place]) (12/26/2014), Arthralgia (11/04/2014), Arthritis, Atypical mycobacterium infection, Cancer ([place]), Chronic systolic heart failure ([place]), Decreased oral intake (12/26/2014), Decreased oral intake (12/26/2014), Depressed left ventricular ejection fraction (02/05/2020), Dilated cardiomyopathy ([place]) (03/19/2019), DJD (degenerative joint disease) (10/07/2014), Dyspnea on exertion (03/19/2019), Essential hypertension (02/05/2020), GERD (gastroesophageal reflux disease), Hammer toes of both feet (01/30/2018), History of thyroid cancer (12/16/2016), Hypercalcemia  (12/26/2014), Hypothyroidism, Hypothyroidism associated with surgical procedure (10/07/2014), Lung mass, Myalgia (11/04/2014), Mycobacterium avium complex ([place]), Nail dystrophy (01/30/2018), Occult malignancy ([place]), PAF (paroxysmal atrial fibrillation) ([place]) (02/05/2020), Persistent atrial fibrillation ([place]) (03/19/2019), PONV (postoperative nausea and vomiting), Pre-ulcerative calluses (01/30/2018), Pseudoaneurysm following procedure ([place]) (04/16/2020), Sensorineural hearing loss (SNHL), bilateral (08/11/2018), Shingles, Toenail fungus (01/30/2018), and Wears glasses.   Surgical History:   Past Surgical History:  Procedure Laterality Date   APPENDECTOMY     CATARACT EXTRACTION W/ INTRAOCULAR LENS  IMPLANT, BILATERAL Bilateral    COLONOSCOPY     INGUINAL HERNIA REPAIR Left    LEFT HEART CATH AND CORONARY ANGIOGRAPHY N/A 03/11/2020   Procedure: LEFT HEART CATH AND CORONARY ANGIOGRAPHY;  Surgeon: Jettie Booze, MD;  Location: [facility];  Service: Cardiovascular;  Laterality: N/A;   MASS EXCISION Right 08/27/2014   Procedure: EXCISION MASS RIGHT PALM/INDEX;  Surgeon: Leanora Cover, MD;  Location: [place];  Service: Orthopedics;  Laterality: Right;   THYROIDECTOMY  2003     Social History:   reports that he has never smoked. He has never used smokeless tobacco. He reports that he does not drink alcohol and does not use drugs.   Family History:  His family history is not on file.   Allergies No Known Allergies   Home Medications  Prior to Admission medications   Medication Sig Start Date End Date Taking? Authorizing Provider  amiodarone (PACERONE) 200 MG tablet Take 200 mg by mouth daily.    [provider]  carvedilol (COREG) 6.25 MG tablet Take 1 tablet (6.25 mg total) by mouth 2 (two) times daily. 12/31/22   Bensimhon, Shaune Pascal, MD  dapagliflozin propanediol (FARXIGA) 10 MG TABS tablet Take 1 tablet (10 mg total) by mouth daily before breakfast. 10/18/22    Park Liter, MD  DOCUSATE SODIUM PO Take 1 tablet by mouth daily.    [provider]  ELIQUIS 5 MG TABS tablet TAKE 1 TABLET BY MOUTH TWICE A DAY 12/03/22   Park Liter, MD  furosemide (LASIX) 20 MG tablet Take 4 tablets (80 mg total) by mouth daily. 12/07/22   Bensimhon, Shaune Pascal, MD  polyethylene glycol (MIRALAX / GLYCOLAX) 17 g packet Take 17 g by mouth daily.    [provider]  sacubitril-valsartan (ENTRESTO) 49-51 MG Take 1 tablet by mouth 2 (two) times daily. 11/18/22   Park Liter, MD  SYNTHROID 175 MCG tablet Take 175 mcg by mouth daily. 05/16/22   [provider]     Critical care time: 35 minutes    Farrel Gordon, DO Internal Medicine PGY-2 [phone]

## 2023-01-17 NOTE — Progress Notes (Signed)
Pharmacy Antibiotic Note  Curtis Ramos is a 89 y.o. male admitted on 01/13/2023 as a level 1 Trauma post fall and s/p PEA arrest who has been on Ceftriaxone for suspected pneumonia now with new GNR on respiratory culture concerning for ventilator associated pneumonia.  Pharmacy has been consulted for broadening to Cefepime dosing.  WBC is unchanged. LA last checked on 3/1 was elevated, recheck in-process.  Afebrile.  Patient was extubated 3/3 and re-intubated same day currently requiring 40% FiO2.  Sputum culture pending but with GNR. Day #5 of admission.  Admitted with AKI- now improving. SCR trending down to baseline. Good urine output.  Plan: Cefepime 2g IV every 12 hours.  Monitor renal function, culture results, and clinical status.    Height: '5\' 11"'$  (180.3 cm) Weight: 54.1 kg (119 lb 4.3 oz) IBW/kg (Calculated) : 75.3  Temp (24hrs), Avg:97.9 F (36.6 C), Min:96.4 F (35.8 C), Max:98.4 F (36.9 C)  Recent Labs  Lab 01/13/23 1223 01/13/23 1344 01/13/23 1635 01/14/23 0333 01/14/23 0821 01/14/23 1320 01/15/23 0400 01/16/23 0207 01/17/23 0318  WBC 13.9*  --   --  15.9*  --   --  13.1* 15.4* 13.3*  CREATININE 2.00*  --   --  2.16*  --   --  1.44* 1.24 1.15  LATICACIDVEN 7.0* 4.6* 2.9*  --  3.6* 5.0*  --   --   --     Estimated Creatinine Clearance: 34 mL/min (by C-G formula based on SCr of 1.15 mg/dL).    No Known Allergies  Antimicrobials this admission: Unasyn 2/29 >> 3/3 Ceftriaxone 3/3 >> 3/4 Cefepime 3/4 >>  Dose adjustments this admission:   Microbiology results: 2/29 Sputum: negative 3/3 Sputum: GNR  2/29 MRSA PCR: negative  Thank you for allowing pharmacy to be a part of this patient's care.  Sloan Leiter, PharmD, BCPS, BCCCP Clinical Pharmacist Please refer to [facility] for [place] numbers 01/17/2023 10:29 AM

## 2023-01-17 NOTE — Progress Notes (Signed)
[facility] ADULT ICU REPLACEMENT PROTOCOL   The patient does apply for the [facility] Adult ICU Electrolyte Replacment Protocol based on the criteria listed below:   1.Exclusion criteria: TCTS, ECMO, Dialysis, and Myasthenia Gravis patients 2. Is GFR >/= 30 ml/min? Yes.    Patient's GFR today is >60 3. Is SCr </= 2? Yes.   Patient's SCr is 1.15 mg/dL 4. Did SCr increase >/= 0.5 in 24 hours? No. 5.Pt's weight >40kg  Yes.   6. Abnormal electrolyte(s): Phos 2.0 with K+ 3.6  7. Electrolytes replaced per protocol 8.  Call MD STAT for K+ </= 2.5, Phos </= 1, or Mag </= 1 Physician:  Dr Willaim Bane, [place] Grumbling 01/17/2023 5:15 AM

## 2023-01-17 NOTE — Progress Notes (Addendum)
Advanced Heart Failure Rounding Note  PCP-Cardiologist: None   Subjective:   2/29 Admitted PEA. CPR ~ 9 min 4 rounds of epi. Intubated and placed on pressors.  3/3 Extubated. Decompensated. Reintubated. Started on dopamine for bradycardia. Palliative Care consulted. -->Full code.    Norepi 1 mcg + Dopamine 2.5 mcg. CO-OX 94.5%   Intubated. Follows commands.    Objective:   Weight Range: 54.1 kg Body mass index is 16.63 kg/m.   Vital Signs:   Temp:  [96.4 F (35.8 C)-98.4 F (36.9 C)] 97.8 F (36.6 C) (03/04 0747) Pulse Rate:  [41-91] 65 (03/04 0709) Resp:  [14-32] 32 (03/04 0709) BP: (85-162)/(35-102) 125/59 (03/04 0709) SpO2:  [87 %-100 %] 100 % (03/04 0710) Arterial Line BP: (81-156)/(40-113) 149/69 (03/04 0600) FiO2 (%):  [40 %-100 %] 40 % (03/04 0710) Weight:  [54.1 kg] 54.1 kg (03/04 0319) Last BM Date : 01/16/23  Weight change: Filed Weights   01/15/23 0102 01/16/23 0314 01/17/23 0319  Weight: 64.6 kg 59 kg 54.1 kg    Intake/Output:   Intake/Output Summary (Last 24 hours) at 01/17/2023 0808 Last data filed at 01/17/2023 0600 Gross per 24 hour  Intake 2282.35 ml  Output 1150 ml  Net 1132.35 ml   CVP 2  Physical Exam   General:  Intubated.  HEENT: ETT Neck: supple. no JVD. Carotids 2+ bilat; no bruits. No lymphadenopathy or thryomegaly appreciated. Cor: PMI nondisplaced. Irregular rate & rhythm. No rubs, gallops or murmurs. Lungs: clear Abdomen: soft, nontender, nondistended. No hepatosplenomegaly. No bruits or masses. Good bowel sounds. Extremities: no cyanosis, clubbing, rash, edema Neuro: Intubated. MAE x4. Follows commands.   Telemetry  A fib with PVCs 60s   3/3 developed Bradycardia---> 2nd degree Heart Block Mobitz 1.  Labs    CBC Recent Labs    01/16/23 0207 01/16/23 1023 01/16/23 2058 01/17/23 0318  WBC 15.4*  --   --  13.3*  HGB 12.2*   < > 11.9* 12.0*  HCT 36.3*   < > 35.0* 35.3*  MCV 108.4*  --   --  108.6*  PLT 143*  --    --  130*   < > = values in this interval not displayed.   Basic Metabolic Panel Recent Labs    01/15/23 1637 01/16/23 0207 01/16/23 1023 01/16/23 2058 01/17/23 0318  NA  --  146*   < > 151* 149*  K  --  4.0   < > 3.8 3.6  CL  --  115*  --   --  117*  CO2  --  23  --   --  21*  GLUCOSE  --  129*  --   --  106*  BUN  --  32*  --   --  32*  CREATININE  --  1.24  --   --  1.15  CALCIUM  --  9.7  --   --  10.1  MG 2.1 2.3  --   --   --   PHOS 2.7 2.4*  --   --  2.0*   < > = values in this interval not displayed.   Liver Function Tests Recent Labs    01/16/23 0207 01/17/23 0318  AST 29 24  ALT 81* 62*  ALKPHOS 71 71  BILITOT 1.5* 1.4*  PROT 5.8* 5.5*  ALBUMIN 2.6* 2.5*   No results for input(s): "LIPASE", "AMYLASE" in the last 72 hours. Cardiac Enzymes No results for input(s): "CKTOTAL", "CKMB", "CKMBINDEX", "TROPONINI" in  the last 72 hours.  BNP: BNP (last 3 results) Recent Labs    12/07/22 1307 12/31/22 1156 01/13/23 1344  BNP >4,500.0* 1,669.4* 1,073.1*    ProBNP (last 3 results) Recent Labs    06/18/22 1331 10/05/22 0848  PROBNP 1,795* 3,434*     D-Dimer No results for input(s): "DDIMER" in the last 72 hours. Hemoglobin A1C No results for input(s): "HGBA1C" in the last 72 hours. Fasting Lipid Panel Recent Labs    01/17/23 0318  TRIG 87   Thyroid Function Tests No results for input(s): "TSH", "T4TOTAL", "T3FREE", "THYROIDAB" in the last 72 hours.  Invalid input(s): "FREET3"  Other results:   Imaging    DG Abd Portable 1V  Result Date: 01/17/2023 CLINICAL DATA:  89 year old male with history of orogastric tube placement. EXAM: PORTABLE ABDOMEN - 1 VIEW COMPARISON:  Abdominal radiograph 01/16/2023. FINDINGS: Orogastric tube has been advanced slightly, now with the tip of the tube likely in the proximal stomach, although the side port is still in the distal esophagus. Advancement of the tube an additional 10 cm is suggested for more optimal  placement. Gaseous distention of the stomach is noted. IMPRESSION: 1. Support apparatus, as above. Electronically Signed   By: Vinnie Langton M.D.   On: 01/17/2023 07:17   DG Abd Portable 1V  Result Date: 01/17/2023 CLINICAL DATA:  OG tube placement EXAM: PORTABLE ABDOMEN - 1 VIEW COMPARISON:  01/16/2019 at 1530 hours FINDINGS: Enteric tube in the distal esophagus, just above the GE junction. Advancement approximately 10 cm is suggested. IMPRESSION: Enteric tube in the distal esophagus, just above the GE junction. Advancement approximately 10 cm is suggested. Electronically Signed   By: Julian Hy M.D.   On: 01/17/2023 00:07   DG Abd Portable 1V  Result Date: 01/16/2023 CLINICAL DATA:  Enteric tube placement. EXAM: PORTABLE ABDOMEN - 1 VIEW COMPARISON:  Abdominal x-ray dated January 13, 2023. FINDINGS: Enteric tube tip in the mid to distal esophagus. Distended stomach. Normal bowel gas pattern. No acute osseous abnormality. IMPRESSION: 1. Enteric tube tip in the mid to distal esophagus. Recommend advancement. Electronically Signed   By: Titus Dubin M.D.   On: 01/16/2023 15:49   DG Chest Port 1 View  Result Date: 01/16/2023 CLINICAL DATA:  Intubation.  Respiratory failure. EXAM: PORTABLE CHEST 1 VIEW COMPARISON:  Chest x-ray from 2 days ago. FINDINGS: Endotracheal tube tip in good position 2.9 cm above the carina. Enteric tube tip in the mid to distal esophagus. Left subclavian central venous catheter with tip in the mid SVC. Unchanged cardiomegaly. Worsened diffuse interstitial thickening and hazy airspace opacities. New small right pleural effusion with right-greater-than-left basilar atelectasis. No pneumothorax. No acute osseous abnormality. IMPRESSION: 1. Appropriately positioned endotracheal tube. 2. Enteric tube tip in the mid to distal esophagus. Recommend advancement. 3. Worsened pulmonary edema and possible superimposed multifocal pneumonia. New small right pleural effusion.  Electronically Signed   By: Titus Dubin M.D.   On: 01/16/2023 15:49     Medications:     Scheduled Medications:  Chlorhexidine Gluconate Cloth  6 each Topical Daily   docusate  100 mg Per Tube BID   feeding supplement (PROSource TF20)  60 mL Per Tube Daily   heparin  5,000 Units Subcutaneous Q8H   hydrocortisone sod succinate (SOLU-CORTEF) inj  100 mg Intravenous Q8H   insulin aspart  0-9 Units Subcutaneous Q4H   levothyroxine  175 mcg Per Tube Daily   mouth rinse  15 mL Mouth Rinse Q2H   pantoprazole (  PROTONIX) IV  40 mg Intravenous QHS   polyethylene glycol  17 g Per Tube Daily   thiamine  100 mg Per Tube Daily    Infusions:  sodium chloride Stopped (01/15/23 1359)   cefTRIAXone (ROCEPHIN)  IV Stopped (01/16/23 1514)   dexmedetomidine (PRECEDEX) IV infusion Stopped (01/15/23 0843)   dextrose 5% lactated ringers Stopped (01/17/23 0040)   DOPamine 2.5 mcg/kg/min (01/17/23 0412)   feeding supplement (OSMOLITE 1.5 CAL) 55 mL/hr at 01/17/23 0100   fentaNYL infusion INTRAVENOUS 50 mcg/hr (01/17/23 0600)   norepinephrine (LEVOPHED) Adult infusion 2 mcg/min (01/17/23 0412)   potassium PHOSPHATE IVPB (in mmol) 43 mL/hr at 01/17/23 0600   propofol (DIPRIVAN) infusion 15 mcg/kg/min (01/17/23 0600)    PRN Medications: HYDROmorphone (DILAUDID) injection, mouth rinse    Patient Profile  Curtis Ramos is a 89 year old with a history of HFrEF, PVCs, LBBB, MAI infection, GERD, HTN, hypothyroidism, and thyroid cancer.   Admitted  PEA arrest/shock. CPR  ~ 9 min -->nondisplaced transverse fracture of mid-lower sternal body, acute fx of costal cartilage of bilateral 2nd-6th ribs + 7th costal cartilage, acute fx of anterior 2nd-7th ribs.    Assessment/Plan   1. Cardiac Arrest--> Shock --> cardiogenic shock - PEA arrest--> 9 min CPR 4 rounds epi. Unshockable rhythm.  - Rib fracture 2-7 ribs  - Off epi. NE 1+ Dopamine 2.5 mcg. Maps stable. Wean off norepi.  - Continue dopamine 2.5 mcg  with bradycardia.  - CVP 2. CO2 21   2. A/C HFrEF, NICM -->Cardiogenic shock - Echo 5/20 EF 20-25% - Echo 12/21 EF 55-60% - Echo  11/23   EF 20-25% LV markedly dilated + dyssynchrony - RV ok  Moderate MR/TR severe biatrial enlargement  - Cath 2012: No CAD (cath not repeated as low suspicion for iCM and CKD 3b) - cMRI 2/24: LBBB, LV EF 17%. RV EF 33%. Severe LAEt. No LGE.  - Zio 2/24  AFL 100% avg rat 67 PVC 1.8% - As above. CVP low.  - GDMT on hold while on pressors.    3. Acute Hypoxic Respiratory Failure in setting of cardiac arrest and aspiration PNA/sternal fractures - Extubated 3/3 but decompensated and required reintubation. - Continue abx (unasyn has high salt load watch fluid status)  4. Chronic AF and frequent PVCs - has been on amio as outpatient.  - Continue to hold amio with bradycardia.  - off AC with fractures  5. LBBB - was being worked up for CRT  6. Rib/sternal fractures + scalp hematoma - due to CPR.  -CT - R scalp hematoma. No acute intracranial abnormality.     7. AKI due to ATN/shock - Creatinine baseline~ 1.6 - Resolved. 1.15  8. Hypothyroidism  - H/O Thyroid cancer. TSH 29 T4 1.26 - Suspect in the setting of acute event.   9. Shock liver - due to arrest -> improving  10. Hypernatremia - Na 146.   11. Bradycardia -He has not been on nodal blockers. Off amio on admit  - Currently on Dopamine 2.5 mcg. Rate currently in the 60s.  - Check EKG.  -may need EP .   12. [facility] following. Full Code.     Length of Stay: 4  Diesel Lina, NP  01/17/2023, 8:08 AM  Advanced Heart Failure Team Pager [phone] (M-F; 7a - 5p)  Please contact [facility] for night-coverage after hours (5p -7a ) and weekends on [email or web address]

## 2023-01-17 NOTE — Procedures (Signed)
Cortrak  Person Inserting Tube:  Ander Slade, RD Tube Type:  Cortrak - 55 inches Tube Location:  Left nare Secured by: Bridle Technique Used to Measure Tube Placement:  Marking at nare/corner of mouth Cortrak Secured At:  74 cm Procedure Comments:  Cortrak Tube Team Note:  Consult received to place a Cortrak feeding tube.   X-ray is required, abdominal x-ray has been ordered by the Cortrak team. Please confirm tube placement before using the Cortrak tube.   If the tube becomes dislodged please keep the tube and contact the Cortrak team at [email or web address] for replacement.  If after hours and replacement cannot be delayed, place a NG tube and confirm placement with an abdominal x-ray.    Ranell Patrick, RD, LDN Clinical Dietitian RD pager # available in [place]  After hours/weekend pager # available in [facility]

## 2023-01-17 NOTE — Progress Notes (Signed)
Brief Nutrition Support Note  Pt able to be extubated yesterday but had to be reintubated for increased WOB and poor oxygenation. OGT replaced but difficulty with advancing into the stomach, no feeds or medication have been given since reintubation. Cortrak tube placed this AM. Discussed feeding plan with RN. Pt was previously tolerating feeds at goal rate. Adjust order and will restart feeds once placement is confirmed.  2/29 - intubated, bronchoscopy 3/1 - cortrak placement pending (unable to place due to agitation, TF initiated via OGT) 3/3 - extubated, reintubated 3/4 - cortrak placed (terminates distal stomach)  TF recommendations: Osmolite 1.5 at 55 ml/h (1320 ml per day) - Start at 29m/h and advance by [id number] to goal - Prosource TF20 60 ml 1x/d - Provides 2060 kcal, 103 gm protein, 1006 ml free water daily  RaRanell PatrickRD, LDN Clinical Dietitian RD pager # available in [place] hours/weekend pager # available in [facility]

## 2023-01-18 ENCOUNTER — Inpatient Hospital Stay (HOSPITAL_COMMUNITY): Payer: Medicare Other

## 2023-01-18 ENCOUNTER — Institutional Professional Consult (permissible substitution): Payer: Medicare Other | Admitting: Cardiology

## 2023-01-18 DIAGNOSIS — I469 Cardiac arrest, cause unspecified: Secondary | ICD-10-CM | POA: Diagnosis not present

## 2023-01-18 DIAGNOSIS — J9601 Acute respiratory failure with hypoxia: Secondary | ICD-10-CM | POA: Diagnosis not present

## 2023-01-18 DIAGNOSIS — Z515 Encounter for palliative care: Secondary | ICD-10-CM | POA: Diagnosis not present

## 2023-01-18 DIAGNOSIS — Z7189 Other specified counseling: Secondary | ICD-10-CM | POA: Diagnosis not present

## 2023-01-18 LAB — GLUCOSE, CAPILLARY
Glucose-Capillary: 106 mg/dL — ABNORMAL HIGH (ref 70–99)
Glucose-Capillary: 91 mg/dL (ref 70–99)
Glucose-Capillary: 130 mg/dL — ABNORMAL HIGH (ref 70–99)
Glucose-Capillary: 178 mg/dL — ABNORMAL HIGH (ref 70–99)
Glucose-Capillary: 160 mg/dL — ABNORMAL HIGH (ref 70–99)
Glucose-Capillary: 132 mg/dL — ABNORMAL HIGH (ref 70–99)

## 2023-01-18 LAB — CBC
HCT: 34.3 % — ABNORMAL LOW (ref 39.0–52.0)
Hemoglobin: 11.9 g/dL — ABNORMAL LOW (ref 13.0–17.0)
MCH: 36.6 pg — ABNORMAL HIGH (ref 26.0–34.0)
MCHC: 34.7 g/dL (ref 30.0–36.0)
MCV: 105.5 fL — ABNORMAL HIGH (ref 80.0–100.0)
Platelets: 134 10*3/uL — ABNORMAL LOW (ref 150–400)
RBC: 3.25 MIL/uL — ABNORMAL LOW (ref 4.22–5.81)
RDW: 14.7 % (ref 11.5–15.5)
WBC: 12.6 10*3/uL — ABNORMAL HIGH (ref 4.0–10.5)
nRBC: 0 % (ref 0.0–0.2)

## 2023-01-18 LAB — COMPREHENSIVE METABOLIC PANEL
ALT: 46 U/L — ABNORMAL HIGH (ref 0–44)
AST: 17 U/L (ref 15–41)
Albumin: 2.4 g/dL — ABNORMAL LOW (ref 3.5–5.0)
Alkaline Phosphatase: 81 U/L (ref 38–126)
Anion gap: 7 (ref 5–15)
BUN: 41 mg/dL — ABNORMAL HIGH (ref 8–23)
CO2: 25 mmol/L (ref 22–32)
Calcium: 9.8 mg/dL (ref 8.9–10.3)
Chloride: 117 mmol/L — ABNORMAL HIGH (ref 98–111)
Creatinine, Ser: 1.24 mg/dL (ref 0.61–1.24)
GFR, Estimated: 56 mL/min — ABNORMAL LOW (ref >60)
Glucose, Bld: 111 mg/dL — ABNORMAL HIGH (ref 70–99)
Potassium: 2.9 mmol/L — ABNORMAL LOW (ref 3.5–5.1)
Sodium: 149 mmol/L — ABNORMAL HIGH (ref 135–145)
Total Bilirubin: 1.3 mg/dL — ABNORMAL HIGH (ref 0.3–1.2)
Total Protein: 5.8 g/dL — ABNORMAL LOW (ref 6.5–8.1)

## 2023-01-18 LAB — BASIC METABOLIC PANEL
Anion gap: 7 (ref 5–15)
BUN: 43 mg/dL — ABNORMAL HIGH (ref 8–23)
CO2: 23 mmol/L (ref 22–32)
Calcium: 9.6 mg/dL (ref 8.9–10.3)
Chloride: 116 mmol/L — ABNORMAL HIGH (ref 98–111)
Creatinine, Ser: 1.25 mg/dL — ABNORMAL HIGH (ref 0.61–1.24)
GFR, Estimated: 55 mL/min — ABNORMAL LOW (ref >60)
Glucose, Bld: 163 mg/dL — ABNORMAL HIGH (ref 70–99)
Potassium: 3.9 mmol/L (ref 3.5–5.1)
Sodium: 146 mmol/L — ABNORMAL HIGH (ref 135–145)

## 2023-01-18 LAB — COOXEMETRY PANEL
Carboxyhemoglobin: 0.9 % (ref 0.5–1.5)
Methemoglobin: <0.7 % (ref 0.0–1.5)
O2 Saturation: 76.3 %
Total hemoglobin: 12.1 g/dL (ref 12.0–16.0)

## 2023-01-18 LAB — CULTURE, RESPIRATORY W GRAM STAIN

## 2023-01-18 LAB — PHOSPHORUS: Phosphorus: 1.6 mg/dL — ABNORMAL LOW (ref 2.5–4.6)

## 2023-01-18 LAB — HEPARIN LEVEL (UNFRACTIONATED): Heparin Unfractionated: >1.1 IU/mL — ABNORMAL HIGH (ref 0.30–0.70)

## 2023-01-18 LAB — APTT: aPTT: 43 seconds — ABNORMAL HIGH (ref 24–36)

## 2023-01-18 MED ORDER — FUROSEMIDE 10 MG/ML IJ SOLN
40.0000 mg | Freq: Once | INTRAMUSCULAR | Status: AC
  Administered 2023-01-18: 40 mg via INTRAVENOUS
  Filled 2023-01-18: qty 4

## 2023-01-18 MED ORDER — POTASSIUM PHOSPHATES 15 MMOLE/5ML IV SOLN
45.0000 mmol | Freq: Once | INTRAVENOUS | Status: AC
  Administered 2023-01-18: 45 mmol via INTRAVENOUS
  Filled 2023-01-18: qty 15

## 2023-01-18 MED ORDER — EPINEPHRINE HCL 5 MG/250ML IV SOLN IN NS
0.5000 ug/min | INTRAVENOUS | Status: DC
  Administered 2023-01-18: 0.5 ug/min via INTRAVENOUS
  Administered 2023-01-18: 8.5 ug/min via INTRAVENOUS
  Administered 2023-01-19: 9.5 ug/min via INTRAVENOUS
  Filled 2023-01-18 (×3): qty 250

## 2023-01-18 MED ORDER — FREE WATER
150.0000 mL | Status: DC
  Administered 2023-01-18 – 2023-01-21 (×13): 150 mL

## 2023-01-18 MED ORDER — HEPARIN (PORCINE) 25000 UT/250ML-% IV SOLN
1200.0000 [IU]/h | INTRAVENOUS | Status: DC
  Administered 2023-01-18: 700 [IU]/h via INTRAVENOUS
  Administered 2023-01-19: 1000 [IU]/h via INTRAVENOUS
  Administered 2023-01-20: 1100 [IU]/h via INTRAVENOUS
  Administered 2023-01-21: 1150 [IU]/h via INTRAVENOUS
  Administered 2023-01-22: 1200 [IU]/h via INTRAVENOUS
  Administered 2023-01-23: 1300 [IU]/h via INTRAVENOUS
  Administered 2023-01-24: 1200 [IU]/h via INTRAVENOUS
  Filled 2023-01-18 (×7): qty 250

## 2023-01-18 MED ORDER — POTASSIUM CHLORIDE 20 MEQ PO PACK
40.0000 meq | PACK | Freq: Once | ORAL | Status: AC
  Administered 2023-01-18: 40 meq
  Filled 2023-01-18: qty 2

## 2023-01-18 MED ORDER — POTASSIUM CHLORIDE 10 MEQ/50ML IV SOLN
10.0000 meq | INTRAVENOUS | Status: AC
  Administered 2023-01-18 (×4): 10 meq via INTRAVENOUS
  Filled 2023-01-18 (×4): qty 50

## 2023-01-18 NOTE — Progress Notes (Signed)
Advanced Heart Failure Rounding Note  PCP-Cardiologist: None   Subjective:   2/29 Admitted PEA. CPR ~ 9 min 4 rounds of epi. Intubated and placed on pressors.  3/3 Extubated. Decompensated. Reintubated. Started on dopamine for bradycardia. Palliative Care consulted. -->Full code.  3/4 Diuresed with IV lasix. Negative 2.8 liters.   Remains on Dopamine 2 mcg+ Norepi 2 mcg.   Remains intubated.    Objective:   Weight Range: 58.7 kg Body mass index is 18.05 kg/m.   Vital Signs:   Temp:  [97.6 F (36.4 C)-98.7 F (37.1 C)] 98.7 F (37.1 C) (03/05 0752) Pulse Rate:  [69-76] 72 (03/05 0706) Resp:  [28-32] 32 (03/05 0706) BP: (100-147)/(55-70) 116/67 (03/05 0800) SpO2:  [100 %] 100 % (03/05 0706) Arterial Line BP: (93-145)/(41-71) 114/50 (03/05 0706) FiO2 (%):  [40 %] 40 % (03/05 0800) Weight:  [58.7 kg] 58.7 kg (03/05 0500) Last BM Date : 01/16/23  Weight change: Filed Weights   01/16/23 0314 01/17/23 0319 01/18/23 0500  Weight: 59 kg 54.1 kg 58.7 kg    Intake/Output:   Intake/Output Summary (Last 24 hours) at 01/18/2023 0838 Last data filed at 01/18/2023 0800 Gross per 24 hour  Intake 1302.62 ml  Output 4290 ml  Net -2987.38 ml   CVP 2 Physical Exam  General:  Intubated.  HEENT: ETT  Neck: supple. no JVD. Carotids 2+ bilat; no bruits. No lymphadenopathy or thryomegaly appreciated. Cor: PMI nondisplaced. Regular rate & rhythm. No rubs, gallops or murmurs. Lungs: clear Abdomen: soft, nontender, nondistended. No hepatosplenomegaly. No bruits or masses. Good bowel sounds. Extremities: no cyanosis, clubbing, rash, edema Neuro:Intubated. Follows commands MAE x4.   Telemetry   SR 60-70s with occasional PVCs. Personally checke.d   Labs    CBC Recent Labs    01/17/23 0318 01/18/23 0522  WBC 13.3* 12.6*  HGB 12.0* 11.9*  HCT 35.3* 34.3*  MCV 108.6* 105.5*  PLT 130* [id number]*   Basic Metabolic Panel Recent Labs    01/15/23 1637 01/15/23 1637 01/16/23 0207  01/16/23 1023 01/17/23 0318 01/18/23 0522  NA  --   --  146*   < > 149* 149*  K  --   --  4.0   < > 3.6 2.9*  CL  --    < > 115*  --  117* 117*  CO2  --    < > 23  --  21* 25  GLUCOSE  --    < > 129*  --  106* 111*  BUN  --    < > 32*  --  32* 41*  CREATININE  --    < > 1.24  --  1.15 1.24  CALCIUM  --    < > 9.7  --  10.1 9.8  MG 2.1  --  2.3  --   --   --   PHOS 2.7  --  2.4*  --  2.0*  --    < > = values in this interval not displayed.   Liver Function Tests Recent Labs    01/17/23 0318 01/18/23 0522  AST 24 17  ALT 62* 46*  ALKPHOS 71 81  BILITOT 1.4* 1.3*  PROT 5.5* 5.8*  ALBUMIN 2.5* 2.4*   No results for input(s): "LIPASE", "AMYLASE" in the last 72 hours. Cardiac Enzymes No results for input(s): "CKTOTAL", "CKMB", "CKMBINDEX", "TROPONINI" in the last 72 hours.  BNP: BNP (last 3 results) Recent Labs    12/07/22 1307 12/31/22 1156 01/13/23 1344  BNP >4,500.0* 1,669.4* 1,073.1*    ProBNP (last 3 results) Recent Labs    06/18/22 1331 10/05/22 0848  PROBNP 1,795* 3,434*     D-Dimer No results for input(s): "DDIMER" in the last 72 hours. Hemoglobin A1C No results for input(s): "HGBA1C" in the last 72 hours. Fasting Lipid Panel Recent Labs    01/17/23 0318  TRIG 87   Thyroid Function Tests No results for input(s): "TSH", "T4TOTAL", "T3FREE", "THYROIDAB" in the last 72 hours.  Invalid input(s): "FREET3"  Other results:   Imaging    DG CHEST PORT 1 VIEW  Result Date: 01/18/2023 CLINICAL DATA:  89 year old male with history of pneumonia. EXAM: PORTABLE CHEST 1 VIEW COMPARISON:  Chest x-ray 01/17/2023. FINDINGS: An endotracheal tube is in place with tip 3.3 cm above the carina. There is a left-sided subclavian central venous catheter with tip terminating in the proximal superior vena cava. A feeding tube is seen extending into the abdomen, however, the tip of the feeding tube extends below the lower margin of the image. There continues to be areas  of interstitial prominence, peribronchial cuffing and poorly defined airspace disease in the lungs (right-greater-than-left), however, the extent of airspace consolidation has improved compared to the prior study. No pleural effusions. No pneumothorax. No evidence of pulmonary edema. Heart size is mildly enlarged. Upper mediastinal contours are within normal limits. Atherosclerotic calcifications are noted in the thoracic aorta. IMPRESSION: 1. Support apparatus, as above. 2. Improving aeration in the lungs with persistent areas of interstitial prominence and asymmetric airspace consolidation, most compatible with a resolving multilobar bilateral bronchopneumonia (right greater than left). 3. Mild cardiomegaly. 4. Aortic atherosclerosis. Electronically Signed   By: Vinnie Langton M.D.   On: 01/18/2023 07:32   DG CHEST PORT 1 VIEW  Result Date: 01/17/2023 CLINICAL DATA:  Pulmonary edema EXAM: PORTABLE CHEST 1 VIEW COMPARISON:  Chest radiograph dated 01/16/2023 FINDINGS: Lines/tubes: Endotracheal tube tip projects 3.8 cm above the carina. Left-sided central venous catheter tip projects over the SVC. Enteric tube tip reaches the diaphragm and terminates below the field of view. Chest: Improved lung aeration with persistent right-greater-than-left patchy and interstitial opacities. Pleura: No pneumothorax.  Decreased right pleural effusion. Heart/mediastinum: Similar enlarged cardiomediastinal silhouette. Bones: No acute osseous abnormality. Surgical clips project over the cervical region. IMPRESSION: 1. Improved lung aeration with persistent right-greater-than-left patchy and interstitial opacitie, which may reflect a combination of pulmonary edema, atelectasis, and/or infection. 2. Decreased right pleural effusion. 3. Support lines and tubes as described. Electronically Signed   By: Darrin Nipper M.D.   On: 01/17/2023 14:46   DG Abd Portable 1V  Result Date: 01/17/2023 CLINICAL DATA:  Feeding tube placement EXAM:  PORTABLE ABDOMEN - 1 VIEW COMPARISON:  01/17/2023 FINDINGS: Limited radiograph of the lower chest and upper abdomen was obtained for the purposes of enteric tube localization. A new large bore enteric tube is seen coursing below the diaphragm with distal tip terminating within the expected location of the distal stomach. Increasing bilateral perihilar opacities, right greater than left. IMPRESSION: 1. New large bore enteric tube with distal tip terminating within the expected location of the distal stomach. 2. Increasing bilateral perihilar opacities, right greater than left. Electronically Signed   By: Davina Poke D.O.   On: 01/17/2023 10:07     Medications:     Scheduled Medications:  Chlorhexidine Gluconate Cloth  6 each Topical Daily   docusate  100 mg Per Tube BID   feeding supplement (PROSource TF20)  60 mL Per Tube Daily  free water  150 mL Per Tube Q4H   furosemide  40 mg Intravenous Once   heparin  5,000 Units Subcutaneous Q8H   insulin aspart  0-9 Units Subcutaneous Q4H   levothyroxine  175 mcg Per Tube Daily   mouth rinse  15 mL Mouth Rinse Q2H   pantoprazole (PROTONIX) IV  40 mg Intravenous QHS   polyethylene glycol  17 g Per Tube Daily   thiamine  100 mg Per Tube Daily    Infusions:  sodium chloride Stopped (01/18/23 0744)   ceFEPime (MAXIPIME) IV Stopped (01/17/23 2258)   epinephrine     feeding supplement (OSMOLITE 1.5 CAL) 1,000 mL (01/18/23 0800)   fentaNYL infusion INTRAVENOUS 75 mcg/hr (01/18/23 0800)   potassium chloride 50 mL/hr at 01/18/23 0800   propofol (DIPRIVAN) infusion 25 mcg/kg/min (01/18/23 0803)    PRN Medications: HYDROmorphone (DILAUDID) injection, lip balm, mouth rinse    Patient Profile  Curtis Ramos is a 89 year old with a history of HFrEF, PVCs, LBBB, MAI infection, GERD, HTN, hypothyroidism, and thyroid cancer.   Admitted  PEA arrest/shock. CPR  ~ 9 min -->nondisplaced transverse fracture of mid-lower sternal body, acute fx of costal  cartilage of bilateral 2nd-6th ribs + 7th costal cartilage, acute fx of anterior 2nd-7th ribs.    Assessment/Plan   1. Cardiac Arrest--> Shock --> cardiogenic shock - PEA arrest--> 9 min CPR 4 rounds epi. Unshockable rhythm.  - Rib fracture 2-7 ribs  - Currently on Dopamine + Norepi. Switch to Epi - Given one dose of IV lasix. Today.    2. A/C HFrEF, NICM -->Cardiogenic shock - Echo 5/20 EF 20-25% - Echo 12/21 EF 55-60% - Echo  11/23   EF 20-25% LV markedly dilated + dyssynchrony - RV ok  Moderate MR/TR severe biatrial enlargement  - Cath 2012: No CAD (cath not repeated as low suspicion for iCM and CKD 3b) - cMRI 2/24: LBBB, LV EF 17%. RV EF 33%. Severe LAEt. No LGE.  - Zio 2/24  AFL 100% avg rat 67 PVC 1.8% - Brisk diuresis with IV lasix. Give one more dose.  - GDMT on hold while on pressors.    3. Acute Hypoxic Respiratory Failure in setting of cardiac arrest and aspiration PNA/sternal fractures - Extubated 3/3 but decompensated and required reintubation. - Continue abx (unasyn has high salt load watch fluid status) - Possible extubation tomorrow.   4. Chronic AF and frequent PVCs - has been on amio as outpatient.  - Continue to hold amio with bradycardia.  - off AC with fractures-->Restart heparin drip today. Down the road switch to Eliquis.  - In SR.   5. LBBB - was being worked up for CRT  6. Rib/sternal fractures + scalp hematoma - due to CPR.  -CT - R scalp hematoma. No acute intracranial abnormality.     7. AKI due to ATN/shock - Creatinine baseline~ 1.6 - Resolved.   8. Hypothyroidism  - H/O Thyroid cancer. TSH 29 T4 1.26 - Suspect in the setting of acute event.   9. Shock liver - due to arrest -> LFTs continue to improve.   10. Hypernatremia - Na 146.   11. Bradycardia -He has not been on nodal blockers. Off amio on admit  - Stable today.  --may need EP .   12. [facility] following. Full Code.     Length of Stay: [place], NP   01/18/2023, 8:38 AM  Advanced Heart Failure Team Pager [phone] (M-F;  7a - 5p)  Please contact [facility] for night-coverage after hours (5p -7a ) and weekends on [email or web address]

## 2023-01-18 NOTE — Progress Notes (Signed)
[facility] ADULT ICU REPLACEMENT PROTOCOL   The patient does apply for the [facility] Adult ICU Electrolyte Replacment Protocol based on the criteria listed below:   1.Exclusion criteria: TCTS, ECMO, Dialysis, and Myasthenia Gravis patients 2. Is GFR >/= 30 ml/min? Yes.    Patient's GFR today is 56 3. Is SCr </= 2? Yes.   Patient's SCr is 1.24 mg/dL 4. Did SCr increase >/= 0.5 in 24 hours? No. 5.Pt's weight >40kg  Yes.   6. Abnormal electrolyte(s): K+2.9  7. Electrolytes replaced per protocol 8.  Call MD STAT for K+ </= 2.5, Phos </= 1, or Mag </= 1 Physician:  Dr.Paliwal  Curtis Ramos 01/18/2023 6:35 AM

## 2023-01-18 NOTE — Progress Notes (Signed)
ANTICOAGULATION CONSULT NOTE - Initial Consult  Pharmacy Consult for IV heparin Indication: atrial fibrillation  No Known Allergies  Patient Measurements: Height: '5\' 11"'$  (180.3 cm) Weight: 58.7 kg (129 lb 6.6 oz) IBW/kg (Calculated) : 75.3 Heparin Dosing Weight: 58.7 kg (TBW)   Vital Signs: Temp: 100.3 F (37.9 C) (03/05 1937) Temp Source: Axillary (03/05 1937) BP: 110/55 (03/05 1544) Pulse Rate: 72 (03/05 1933)  Labs: Recent Labs    01/16/23 0207 01/16/23 1023 01/16/23 2058 01/17/23 0318 01/18/23 0522 01/18/23 1247 01/18/23 1843  HGB 12.2*   < > 11.9* 12.0* 11.9*  --   --   HCT 36.3*   < > 35.0* 35.3* 34.3*  --   --   PLT 143*  --   --  130* 134*  --   --   APTT  --   --   --   --   --   --  43*  HEPARINUNFRC  --   --   --   --   --   --  >1.10*  CREATININE 1.24  --   --  1.15 1.24 1.25*  --    < > = values in this interval not displayed.     Estimated Creatinine Clearance: 33.9 mL/min (A) (by C-G formula based on SCr of 1.25 mg/dL (H)).   Medical History: Past Medical History:  Diagnosis Date   Acute on chronic combined systolic and diastolic CHF (congestive heart failure) ([place]) 03/19/2019   AKI (acute kidney injury) ([place]) 12/26/2014   Arthralgia 11/04/2014   Arthritis    "proabably"   Atypical mycobacterium infection    [facility])    family unaware of this hx on [id number]   Chronic systolic heart failure (HCC)    Decreased oral intake 12/26/2014   Decreased oral intake 12/26/2014   Depressed left ventricular ejection fraction 02/05/2020   Dilated cardiomyopathy ([place]) 03/19/2019   Ejection fraction 20 to 25% based on echocardiogram done by pulmonologist month ago.   DJD (degenerative joint disease) 10/07/2014   Dyspnea on exertion 03/19/2019   Essential hypertension 02/05/2020   GERD (gastroesophageal reflux disease)    Hammer toes of both feet 01/30/2018   History of thyroid cancer 12/16/2016   Hypercalcemia 12/26/2014   Hypothyroidism    Hypothyroidism  associated with surgical procedure 10/07/2014   Lung mass    Myalgia 11/04/2014   Mycobacterium avium complex ([place])    Nail dystrophy 01/30/2018   Occult malignancy (HCC)    PAF (paroxysmal atrial fibrillation) ([place]) 02/05/2020   Persistent atrial fibrillation ([place]) 03/19/2019   Chads 2 Vascor equals 3   PONV (postoperative nausea and vomiting)    Pre-ulcerative calluses 01/30/2018   Pseudoaneurysm following procedure ([place]) 04/16/2020   Sensorineural hearing loss (SNHL), bilateral 08/11/2018   Shingles    Toenail fungus 01/30/2018   Wears glasses     Assessment: 26 YOM with hx afib, on Eliquis PTA, last dose 2/29 PTA. On subcutaneous heparin for VTE prophylaxis. Pt with R-scalp hematoma on 2/29, CT head negative for intracranial hemorrhage.Pharmacy consulted to start IV heparin while Eliquis is on hold. Discussed with HF team, will start heparin with no bolus and slowly titrate to lower heparin level/aPTT goal. Hgb 11.9 stable, PLT 134 low stable.   Heparin came back elevated but not correlating with PTT yet. PTT subtherapeutic at 43. No bleeding per Rn. We will increase rate and check in AM.   Goal of Therapy:  Heparin level 0.3-0.5 units/ml aPTT 66-85 seconds Monitor platelets  by anticoagulation protocol: Yes   Plan:  Increase heparin to 850 units/hr Check AM aPTT/HL  Daily aPTT and HL until correlates Monitor s/sx bleeding.   Onnie Boer, PharmD, BCIDP, AAHIVP, CPP Infectious Disease Pharmacist 01/18/2023 8:20 PM

## 2023-01-18 NOTE — Progress Notes (Signed)
Palliative:  [name] is resting quietly in bed.  He appears acutely/chronically ill and very frail.  He is intubated and somewhat sedated.  He is surrounded by his loving family including his son/HCPOA, Curtis Ramos.  Bedside nursing staff is present attending to needs.  Curtis Ramos and his sibling share that they are very encouraged, they feel that [name] is improving.  He remains on PRVC.  Vasopressors transitioned to epinephrine drip, also now on heparin infusion.  Respiratory culture with gram-negative rods, antibiotics.  Followed by advanced heart failure team. Per CC resident note 3/5: When attempted SBT this morning he was apneic and transitioned back to full ventilatory support. He remains on sedation. I do not know how well extubation will go for him and he may be a candidate for early tracheostomy for this reason.    Face-to-face conference with bedside nursing staff related to patient condition, needs, goals of care.  Plan:  Continue full scope/full code.  Time for outcomes.  31 minutes  Curtis Axe, NP Palliative Medicine Team  Team Phone [phone] Greater than 50% of this time was spent counseling and coordinating care related to the above assessment and plan.

## 2023-01-18 NOTE — Progress Notes (Signed)
NAME:  Curtis Ramos, MRN:  [id number], DOB:  01/05/1932, LOS: 5 ADMISSION DATE:  01/13/2023, CONSULTATION DATE:  01/13/2023 REFERRING MD:  Trauma, CHIEF COMPLAINT:  Cardiac arrest, post-fall (Level 1 Trauma)   History of Present Illness:  89 year old man who presented to [facility] ED 2/29 via EMS after unwitnessed fall at work. PMHx significant for HTN, HFrEF with NICM (cMRI 12/2022 with severe LV dilation, EF 17%, LBBB, followed by Dr. Haroldine Laws), AFib (on Eliquis), MAI infection, GERD, hypothyroidism, history of thyroid CA.   History obtained from chart review and from family. Patient presented to [facility] ED via EMS after a fall at work; per family, patient did not have any complaints prior to fall. Recently undergoing workup with Cardiology for pacemaker placement. He was found down between a radiator and the wall, unresponsive with contusion and deformities to R side of his head. Initially with tachycardic with EMS, then bradycardic. ST elevation noted on EKG and Code STEMI was called, later called off as ST elevation had resolved. Patient lost pulses while in ED and became apneic with cardiac monitoring showing PEA arrest. CPR administered x 9 minutes with Epi x 4 and ROSC, patient never had a shockable rhythm. Intubated peri-arrest. Cards was consulted, noting high risk of VT/VF in the setting of severe CM. AHF was also notified of admission.   Labs demonstrated WBC 13.9, Hgb 14.1, Plt 169. INR 2.2 (on Eliquis). Na 138, K 4.1, CO2 18, Cr 1.79 (baseline ~1.3). CK 205. LA 7.0 > 4.6. Trop 27 > 155. BNP 1073. UA with glucose > 500, +protein. PCT < 0.10. CT Head NAICA, R parietal scalp hematoma. CT C-spine negative for acute findings. CT Chest/A/P with nondisplaced transverse fracture of mid-lower sternal body, acute fx of costal cartilage of bilateral 2nd-6th ribs + 7th costal cartilage, acute fx of anterior 2nd-7th ribs; pulmonary lobular interstitial thickening (?pulm edema), BLL opacities, trace ascites.  Empiric Unasyn started for ?aspiration.   PCCM was consulted for ICU admission. Pertinent  Medical History   Past Medical History:  Diagnosis Date   Acute on chronic combined systolic and diastolic CHF (congestive heart failure) ([place]) 03/19/2019   AKI (acute kidney injury) ([place]) 12/26/2014   Arthralgia 11/04/2014   Arthritis    "proabably"   Atypical mycobacterium infection    Cancer [facility])    family unaware of this hx on [id number]   Chronic systolic heart failure (HCC)    Decreased oral intake 12/26/2014   Decreased oral intake 12/26/2014   Depressed left ventricular ejection fraction 02/05/2020   Dilated cardiomyopathy ([place]) 03/19/2019   Ejection fraction 20 to 25% based on echocardiogram done by pulmonologist month ago.   DJD (degenerative joint disease) 10/07/2014   Dyspnea on exertion 03/19/2019   Essential hypertension 02/05/2020   GERD (gastroesophageal reflux disease)    Hammer toes of both feet 01/30/2018   History of thyroid cancer 12/16/2016   Hypercalcemia 12/26/2014   Hypothyroidism    Hypothyroidism associated with surgical procedure 10/07/2014   Lung mass    Myalgia 11/04/2014   Mycobacterium avium complex ([place])    Nail dystrophy 01/30/2018   Occult malignancy (HCC)    PAF (paroxysmal atrial fibrillation) ([place]) 02/05/2020   Persistent atrial fibrillation ([place]) 03/19/2019   Chads 2 Vascor equals 3   PONV (postoperative nausea and vomiting)    Pre-ulcerative calluses 01/30/2018   Pseudoaneurysm following procedure ([place]) 04/16/2020   Sensorineural hearing loss (SNHL), bilateral 08/11/2018   Shingles    Toenail fungus 01/30/2018   Wears  glasses    Significant Hospital Events: Including procedures, antibiotic start and stop dates in addition to other pertinent events   2/29 - Presented to [facility] ED after 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 Found between radiator and wall with R-sided head injury. Suspected cardiac etiology of unresponsiveness. On ED arrival,  became pulseless (rhythm PEA). CPR x 9 min, Epi x 4 with ROSC. Intubated peri-arrest. CT Head, C-Spine, Chest/A/P completed (as above). Unasyn for ?aspiration. R axillary A-line, L subclavian CVC, ETT tube exchange + bronch.  3/3 - Extubated, back into respiratory failure, reintubated 3/3 - OG tube placement complications, completely replaced, still trouble with placement 3/4 - Started cefepime for respiratory culture showing abundant pseudomonas  Interim History / Subjective:  No changes overnight. Volume status improving with IV lasix. Started on IV cefepime 03/04 for respiratory culture growing abundant pseudomonas.  Objective   Blood pressure (!) 103/59, pulse 76, temperature 98 F (36.7 C), temperature source Oral, resp. rate (!) 32, height '5\' 11"'$  (1.803 m), weight 58.7 kg, SpO2 100 %. CVP:  [4 mmHg-7 mmHg] 7 mmHg  Vent Mode: PRVC FiO2 (%):  [40 %] 40 % Set Rate:  [32 bmp] 32 bmp Vt Set:  [600 mL] 600 mL PEEP:  [5 cmH20] 5 cmH20 Plateau Pressure:  [18 cmH20-22 cmH20] 22 cmH20   Intake/Output Summary (Last 24 hours) at 01/18/2023 0702 Last data filed at 01/18/2023 0600 Gross per 24 hour  Intake 1264.56 ml  Output 4115 ml  Net -2850.44 ml    Filed Weights   01/16/23 0314 01/17/23 0319 01/18/23 0500  Weight: 59 kg 54.1 kg 58.7 kg   Examination: Constitutional:Ill-appearing elderly gentleman. In no acute distress. Cardio:Regular rate with irregular rhythm. No murmurs, rubs, or gallops. Pulm:Clear to auscultation bilaterally. On full ventilatory support. Abdomen:Soft, nontender, non-distended. [id number] for extremity edema.  Skin:Warm and dry. Neuro:Opens eyes to voice. Follows commands, nods yes or shakes head no to simple questions. Shrugs shoulders.  Resolved Hospital Problem list   AKI Acute encephalopathy, multifactorial Lactic acidosis Assessment & Plan:  Acute hypoxemic respiratory failure 2/2 VAP and pulmonary edema Acute on chronic systolic heart failure, LF EV  17% Atelectasis 2/2 sternum and rib fractures sustained during CPR Severe nonischemic cardiomyopathy Atrial fibrillation Leukocytosis Remains on full ventilatory support. Leukocytosis improved 1.3>12.6. Has been afebrile over last 24h. Repeat respiratory culture 03/03 growing abundant pseudomonas, reincubated for better growth. Ceftriaxone changed to cefepime yesterday once culture results updated.   Repeat CXR this morning showed improving aeration in the lungs with persistent areas of interstitial prominence and asymmetric airspace consolidation, most compatible with a resolving multilobar bilateral bronchopneumonia R>L. He did receive a total of 80 mg IV lasix 03/04 with UOP of 4.1L. Renal function has remained stable with this diuresis. He remains overall net positive since admission.  When attempted SBT this morning he was apneic and transitioned back to full ventilatory support. He remains on sedation. I do not know how well extubation will go for him and he may be a candidate for early tracheostomy for this reason. Advanced HF team following, appreciate their recommendations. Currently holding home amiodarone and GDMT for HF.  Plan: -Continue cefepime -IV lasix 40 mg x1, can give additional dose to meet net neutral fluid status for today -Follow respiratory culture -Continue full ventilator support; wean FiO2 for O2 sat > 90% -Daily SBT as tolerated; ensure off of sedation prior to trial -VAP bundle, pulmonary hygiene -PAD protocol for sedation: propofol and fentanyl for  goal RASS -1 -Advanced HF team following -Trend WBC, fever curve -Continue cardiac monitoring, CVP -Ongoing GOC conversations with family  Post-PEA arrest Multifactorial shock Intermittently requiring vasopressor support, now weaned to norepinephrine alone when support is needed. Co-ox panel with O2 saturation 76.3%. Stress steroids d/ced 03/04. Previously concerned for septic shock in setting of aspiration,  compounded further by advanced heart failure and possible superimposed cardiogenic shock. Plan: -Will transition to epinephrine and discontinue levophed and dopamine -Cautious IVF resuscitation if needed -Trend co-ox -Infectious management as above  Thrombocytopenia Likely reactive. Plan: -Trend CBC  Hypernatremia Hypokalemia Na 149, unchanged, and K 2.9. TF with FWF started 03/04. Plan: -Replete K -Increase FWFto q4h   Elevated liver enzymes Likely shock liver in setting of PEA arrest. Continue to improve, nearly resolved with AST normal and ALT 46. Total bilirubin now 1.3.  Hx hypothyroidism Plan: -Continue home levothyroxine 175 mcg daily per tube -Recommend repeat labs as outpatient once acute illness has resolved   Nutrition needs Plan: -Tube feeds w/ free water flushes -SSI, CBG monitoring; goal CBG 140-180  Scalp hematoma Rib fractures Sternum fracture Sustained during CPR.   Best Practice (right click and "Reselect all SmartList Selections" daily)   Diet/type: tubefeeds DVT prophylaxis: prophylactic heparin  GI prophylaxis: PPI Lines: Central line and Arterial Line Foley:  Yes, and it is still needed Code Status:  full code Last date of multidisciplinary goals of care discussion [02/29]  Labs   CBC: Recent Labs  Lab 01/14/23 0333 01/14/23 0432 01/15/23 0400 01/16/23 0207 01/16/23 1023 01/16/23 1206 01/16/23 2058 01/17/23 0318 01/18/23 0522  WBC 15.9*  --  13.1* 15.4*  --   --   --  13.3* 12.6*  HGB 15.9   < > 13.9 12.2* 11.9* 11.6* 11.9* 12.0* 11.9*  HCT 47.6   < > 40.1 36.3* 35.0* 34.0* 35.0* 35.3* 34.3*  MCV 108.2*  --  105.5* 108.4*  --   --   --  108.6* 105.5*  PLT 205  --  148* 143*  --   --   --  130* 134*   < > = values in this interval not displayed.     Basic Metabolic Panel: Recent Labs  Lab 01/14/23 0333 01/14/23 0432 01/14/23 1638 01/15/23 0400 01/15/23 1637 01/16/23 0207 01/16/23 1023 01/16/23 1206 01/16/23 2058  01/17/23 0318 01/18/23 0522  NA 137   < >  --  142  --  146* 149* 149* 151* 149* 149*  K 3.9   < >  --  3.5  --  4.0 4.0 3.9 3.8 3.6 2.9*  CL 104  --   --  114*  --  115*  --   --   --  117* 117*  CO2 18*  --   --  20*  --  23  --   --   --  21* 25  GLUCOSE 135*  --   --  116*  --  129*  --   --   --  106* 111*  BUN 37*  --   --  32*  --  32*  --   --   --  32* 41*  CREATININE 2.16*  --   --  1.44*  --  1.24  --   --   --  1.15 1.24  CALCIUM 8.8*  --   --  9.1  --  9.7  --   --   --  10.1 9.8  MG 2.2  --  2.2  2.1 2.1 2.3  --   --   --   --   --   PHOS 3.8  --  3.3 2.3* 2.7 2.4*  --   --   --  2.0*  --    < > = values in this interval not displayed.    GFR: Estimated Creatinine Clearance: 34.2 mL/min (by C-G formula based on SCr of 1.24 mg/dL). Recent Labs  Lab 01/13/23 1344 01/13/23 1635 01/14/23 0333 01/14/23 0821 01/14/23 1320 01/15/23 0400 01/16/23 0207 01/17/23 0318 01/17/23 0945 01/18/23 0522  PROCALCITON <0.10  --   --   --   --   --   --   --   --   --   WBC  --   --    < >  --   --  13.1* 15.4* 13.3*  --  12.6*  LATICACIDVEN 4.6* 2.9*  --  3.6* 5.0*  --   --   --  1.7  --    < > = values in this interval not displayed.     Liver Function Tests: Recent Labs  Lab 01/14/23 0333 01/15/23 0400 01/16/23 0207 01/17/23 0318 01/18/23 0522  AST 191* 44* '29 24 17  '$ ALT 228* 117* 81* 62* 46*  ALKPHOS 111 82 71 71 81  BILITOT 1.2 1.5* 1.5* 1.4* 1.3*  PROT 6.2* 5.7* 5.8* 5.5* 5.8*  ALBUMIN 3.1* 2.6* 2.6* 2.5* 2.4*    No results for input(s): "LIPASE", "AMYLASE" in the last 168 hours. No results for input(s): "AMMONIA" in the last 168 hours.  ABG    Component Value Date/Time   PHART 7.426 01/16/2023 2058   PCO2ART 36.4 01/16/2023 2058   PO2ART 88 01/16/2023 2058   HCO3 23.9 01/16/2023 2058   TCO2 25 01/16/2023 2058   ACIDBASEDEF 4.0 (H) 01/16/2023 1206   O2SAT 76.3 01/18/2023 0540     Coagulation Profile: Recent Labs  Lab 01/13/23 1223  INR 2.2*      Cardiac Enzymes: Recent Labs  Lab 01/13/23 1209  CKTOTAL 205     HbA1C: Hgb A1c MFr Bld  Date/Time Value Ref Range Status  01/13/2023 12:23 PM 5.9 (H) 4.8 - 5.6 % Final    Comment:    (NOTE)         Prediabetes: 5.7 - 6.4         Diabetes: >6.4         Glycemic control for adults with diabetes: <7.0     CBG: Recent Labs  Lab 01/17/23 1525 01/17/23 1908 01/17/23 2306 01/17/23 2308 01/18/23 0329  GLUCAP 102* 119* 54* 84 106*     Past Medical History:  He,  has a past medical history of Acute on chronic combined systolic and diastolic CHF (congestive heart failure) ([place]) (03/19/2019), AKI (acute kidney injury) ([place]) (12/26/2014), Arthralgia (11/04/2014), Arthritis, Atypical mycobacterium infection, Cancer ([place]), Chronic systolic heart failure ([place]), Decreased oral intake (12/26/2014), Decreased oral intake (12/26/2014), Depressed left ventricular ejection fraction (02/05/2020), Dilated cardiomyopathy ([place]) (03/19/2019), DJD (degenerative joint disease) (10/07/2014), Dyspnea on exertion (03/19/2019), Essential hypertension (02/05/2020), GERD (gastroesophageal reflux disease), Hammer toes of both feet (01/30/2018), History of thyroid cancer (12/16/2016), Hypercalcemia (12/26/2014), Hypothyroidism, Hypothyroidism associated with surgical procedure (10/07/2014), Lung mass, Myalgia (11/04/2014), Mycobacterium avium complex ([place]), Nail dystrophy (01/30/2018), Occult malignancy ([place]), PAF (paroxysmal atrial fibrillation) ([place]) (02/05/2020), Persistent atrial fibrillation ([place]) (03/19/2019), PONV (postoperative nausea and vomiting), Pre-ulcerative calluses (01/30/2018), Pseudoaneurysm following procedure ([place]) (04/16/2020), Sensorineural hearing loss (SNHL), bilateral (08/11/2018), Shingles, Toenail fungus (01/30/2018), and Wears  glasses.   Surgical History:   Past Surgical History:  Procedure Laterality Date   APPENDECTOMY     CATARACT EXTRACTION W/ INTRAOCULAR LENS  IMPLANT, BILATERAL Bilateral     COLONOSCOPY     INGUINAL HERNIA REPAIR Left    LEFT HEART CATH AND CORONARY ANGIOGRAPHY N/A 03/11/2020   Procedure: LEFT HEART CATH AND CORONARY ANGIOGRAPHY;  Surgeon: Jettie Booze, MD;  Location: [place] [facility];  Service: Cardiovascular;  Laterality: N/A;   MASS EXCISION Right 08/27/2014   Procedure: EXCISION MASS RIGHT PALM/INDEX;  Surgeon: Leanora Cover, MD;  Location: [place];  Service: Orthopedics;  Laterality: Right;   THYROIDECTOMY  2003     Social History:   reports that he has never smoked. He has never used smokeless tobacco. He reports that he does not drink alcohol and does not use drugs.   Family History:  His family history is not on file.   Allergies No Known Allergies   Home Medications  Prior to Admission medications   Medication Sig Start Date End Date Taking? Authorizing Provider  amiodarone (PACERONE) 200 MG tablet Take 200 mg by mouth daily.    [provider]  carvedilol (COREG) 6.25 MG tablet Take 1 tablet (6.25 mg total) by mouth 2 (two) times daily. 12/31/22   Bensimhon, Shaune Pascal, MD  dapagliflozin propanediol (FARXIGA) 10 MG TABS tablet Take 1 tablet (10 mg total) by mouth daily before breakfast. 10/18/22   Park Liter, MD  DOCUSATE SODIUM PO Take 1 tablet by mouth daily.    [provider]  ELIQUIS 5 MG TABS tablet TAKE 1 TABLET BY MOUTH TWICE A DAY 12/03/22   Park Liter, MD  furosemide (LASIX) 20 MG tablet Take 4 tablets (80 mg total) by mouth daily. 12/07/22   Bensimhon, Shaune Pascal, MD  polyethylene glycol (MIRALAX / GLYCOLAX) 17 g packet Take 17 g by mouth daily.    [provider]  sacubitril-valsartan (ENTRESTO) 49-51 MG Take 1 tablet by mouth 2 (two) times daily. 11/18/22   Park Liter, MD  SYNTHROID 175 MCG tablet Take 175 mcg by mouth daily. 05/16/22   [provider]     Critical care time: 35 minutes    Farrel Gordon, DO Internal Medicine PGY-2 [phone]

## 2023-01-18 NOTE — TOC Initial Note (Signed)
Transition of Care [facility]) - Initial/Assessment Note    Patient Details  Name: Curtis Ramos MRN: [id number] Date of Birth: 1932-10-08  Transition of Care [facility]) CM/SW Contact:    Erenest Rasher, RN Phone Number: [phone] 01/18/2023, 5:16 PM  Clinical Narrative:          [facility] Admin coordinator, Anderson Malta received referral for LTAC.           Spoke to pt's son and offered choice for LTAC, he selected [facility]. Referral sent to [facility] rep, Anderson Malta. Explained LTAC care to pt's son, he is agreeable.     Expected Discharge Plan: Long Term Acute Care (LTAC) Barriers to Discharge: Continued Medical Work up   Patient Goals and CMS Choice Patient states their goals for this hospitalization and ongoing recovery are:: wants patient to recover CMS Medicare.gov Compare Post Acute Care list provided to:: Patient Represenative (must comment) Joya Martyr son) Choice offered to / list presented to : Adult Children      Expected Discharge Plan and Services   Discharge Planning Services: CM Consult Post Acute Care Choice: Long Term Acute Care (LTAC) Living arrangements for the past 2 months: Single Family Home                                      Prior Living Arrangements/Services Living arrangements for the past 2 months: Single Family Home Lives with:: Self Patient language and need for interpreter reviewed:: Yes                 Activities of Daily Living      Permission Sought/Granted Permission sought to share information with : Case Manager, Family Supports    Share Information with NAME: Avrian Baliles     Permission granted to share info w Relationship: son  Permission granted to share info w Contact Information: [phone]  Emotional Assessment   Attitude/Demeanor/Rapport: Intubated (Following Commands or Not Following Commands)          Admission diagnosis:  Cardiac arrest [facility]) [I46.9] Patient Active Problem List   Diagnosis Date Noted    Protein-calorie malnutrition, severe 01/14/2023   Cardiac arrest ([place]) 01/13/2023   Septic shock ([place]) 01/13/2023   Acute respiratory failure with hypoxia ([place]) 01/13/2023   Permanent atrial fibrillation ([place]) 10/15/2020   Wears glasses    Shingles    PONV (postoperative nausea and vomiting)    Mycobacterium avium complex ([place])    Lung mass    Hypothyroidism    GERD (gastroesophageal reflux disease)    Cancer ([place])    Arthritis    Pseudoaneurysm following procedure ([place]) [id number]   Chronic systolic heart failure (HCC)    PAF (paroxysmal atrial fibrillation) ([place]) 02/05/2020   Depressed left ventricular ejection fraction 02/05/2020   Essential hypertension 02/05/2020   Dilated cardiomyopathy ([place]) 03/19/2019   Acute on chronic combined systolic and diastolic CHF (congestive heart failure) ([place]) 03/19/2019   Dyspnea on exertion 03/19/2019   Persistent atrial fibrillation ([place]) 03/19/2019   Sensorineural hearing loss (SNHL), bilateral 08/11/2018   Hammer toes of both feet 01/30/2018   Nail dystrophy 01/30/2018   Pre-ulcerative calluses 01/30/2018   Toenail fungus 01/30/2018   History of thyroid cancer 12/16/2016   Occult malignancy ([place])    Hypercalcemia 12/26/2014   AKI (acute kidney injury) ([place]) 12/26/2014   Decreased oral intake 12/26/2014   Myalgia 11/04/2014   Arthralgia 11/04/2014   Hypothyroidism  associated with surgical procedure 10/07/2014   DJD (degenerative joint disease) 10/07/2014   Atypical mycobacterium infection    PCP:  Leonides Sake, MD Pharmacy:   [facility], [place] [address] Phone: [phone] Fax: [phone]  [id number] [id number]: [phone]: [phone]    Social Determinants of Health (SDOH) Social History: SDOH Screenings   Food Insecurity: No  Food Insecurity (01/18/2023)  Housing: Low Risk  (01/18/2023)  Transportation Needs: No Transportation Needs (01/18/2023)  Utilities: Not At Risk (01/18/2023)  Financial Resource Strain: Low Risk  (01/18/2023)  Tobacco Use: Low Risk  (01/13/2023)   SDOH Interventions: Food Insecurity Interventions: Intervention Not Indicated Housing Interventions: Intervention Not Indicated Transportation Interventions: Intervention Not Indicated Utilities Interventions: Intervention Not Indicated Financial Strain Interventions: Intervention Not Indicated   Readmission Risk Interventions     No data to display

## 2023-01-18 NOTE — Progress Notes (Addendum)
ANTICOAGULATION CONSULT NOTE - Initial Consult  Pharmacy Consult for IV heparin Indication: atrial fibrillation  No Known Allergies  Patient Measurements: Height: '5\' 11"'$  (180.3 cm) Weight: 58.7 kg (129 lb 6.6 oz) IBW/kg (Calculated) : 75.3 Heparin Dosing Weight: 58.7 kg (TBW)   Vital Signs: Temp: 98.7 F (37.1 C) (03/05 0752) Temp Source: Oral (03/05 0752) BP: 116/67 (03/05 0800) Pulse Rate: 72 (03/05 0706)  Labs: Recent Labs    01/16/23 0207 01/16/23 1023 01/16/23 2058 01/17/23 0318 01/18/23 0522  HGB 12.2*   < > 11.9* 12.0* 11.9*  HCT 36.3*   < > 35.0* 35.3* 34.3*  PLT 143*  --   --  130* 134*  CREATININE 1.24  --   --  1.15 1.24   < > = values in this interval not displayed.    Estimated Creatinine Clearance: 34.2 mL/min (by C-G formula based on SCr of 1.24 mg/dL).   Medical History: Past Medical History:  Diagnosis Date   Acute on chronic combined systolic and diastolic CHF (congestive heart failure) ([place]) 03/19/2019   AKI (acute kidney injury) ([place]) 12/26/2014   Arthralgia 11/04/2014   Arthritis    "proabably"   Atypical mycobacterium infection    Cancer [facility]    family unaware of this hx on [id number]   Chronic systolic heart failure (HCC)    Decreased oral intake 12/26/2014   Decreased oral intake 12/26/2014   Depressed left ventricular ejection fraction 02/05/2020   Dilated cardiomyopathy ([place]) 03/19/2019   Ejection fraction 20 to 25% based on echocardiogram done by pulmonologist month ago.   DJD (degenerative joint disease) 10/07/2014   Dyspnea on exertion 03/19/2019   Essential hypertension 02/05/2020   GERD (gastroesophageal reflux disease)    Hammer toes of both feet 01/30/2018   History of thyroid cancer 12/16/2016   Hypercalcemia 12/26/2014   Hypothyroidism    Hypothyroidism associated with surgical procedure 10/07/2014   Lung mass    Myalgia 11/04/2014   Mycobacterium avium complex ([place])    Nail dystrophy 01/30/2018   Occult malignancy (HCC)    PAF  (paroxysmal atrial fibrillation) ([place]) 02/05/2020   Persistent atrial fibrillation ([place]) 03/19/2019   Chads 2 Vascor equals 3   PONV (postoperative nausea and vomiting)    Pre-ulcerative calluses 01/30/2018   Pseudoaneurysm following procedure ([place]) 04/16/2020   Sensorineural hearing loss (SNHL), bilateral 08/11/2018   Shingles    Toenail fungus 01/30/2018   Wears glasses     Assessment: 29 YOM with hx afib, on Eliquis PTA, last dose 2/29 PTA. On subcutaneous heparin for VTE prophylaxis. Pt with R-scalp hematoma on 2/29, CT head negative for intracranial hemorrhage.Pharmacy consulted to start IV heparin while Eliquis is on hold. Discussed with HF team, will start heparin with no bolus and slowly titrate to lower heparin level/aPTT goal. Hgb 11.9 stable, PLT 134 low stable.   Goal of Therapy:  Heparin level 0.3-0.5 units/ml aPTT 66-85 seconds Monitor platelets by anticoagulation protocol: Yes   Plan:  Stop subcutaneous heparin  Start heparin at 700 units/hr Check 8 hour aPTT/HL  Daily aPTT and HL until correlates Monitor s/sx bleeding.   Eliseo Gum, PharmD PGY1 Pharmacy Resident   01/18/2023  9:10 AM

## 2023-01-19 DIAGNOSIS — J9601 Acute respiratory failure with hypoxia: Secondary | ICD-10-CM | POA: Diagnosis not present

## 2023-01-19 DIAGNOSIS — I469 Cardiac arrest, cause unspecified: Secondary | ICD-10-CM | POA: Diagnosis not present

## 2023-01-19 DIAGNOSIS — Z7189 Other specified counseling: Secondary | ICD-10-CM | POA: Diagnosis not present

## 2023-01-19 DIAGNOSIS — Z515 Encounter for palliative care: Secondary | ICD-10-CM | POA: Diagnosis not present

## 2023-01-19 LAB — COOXEMETRY PANEL
Carboxyhemoglobin: 2 % — ABNORMAL HIGH (ref 0.5–1.5)
Carboxyhemoglobin: 1.7 % — ABNORMAL HIGH (ref 0.5–1.5)
Methemoglobin: <0.7 % (ref 0.0–1.5)
Methemoglobin: <0.7 % (ref 0.0–1.5)
O2 Saturation: 91 %
O2 Saturation: 75.8 %
Total hemoglobin: 10.2 g/dL — ABNORMAL LOW (ref 12.0–16.0)
Total hemoglobin: 11.1 g/dL — ABNORMAL LOW (ref 12.0–16.0)

## 2023-01-19 LAB — POCT I-STAT 7, (LYTES, BLD GAS, ICA,H+H)
Acid-base deficit: 1 mmol/L (ref 0.0–2.0)
Bicarbonate: 23.7 mmol/L (ref 20.0–28.0)
Calcium, Ion: 1.41 mmol/L — ABNORMAL HIGH (ref 1.15–1.40)
HCT: 29 % — ABNORMAL LOW (ref 39.0–52.0)
Hemoglobin: 9.9 g/dL — ABNORMAL LOW (ref 13.0–17.0)
O2 Saturation: 99 %
Patient temperature: 99.6
Potassium: 4.1 mmol/L (ref 3.5–5.1)
Sodium: 148 mmol/L — ABNORMAL HIGH (ref 135–145)
TCO2: 25 mmol/L (ref 22–32)
pCO2 arterial: 40.1 mmHg (ref 32–48)
pH, Arterial: 7.382 (ref 7.35–7.45)
pO2, Arterial: 155 mmHg — ABNORMAL HIGH (ref 83–108)

## 2023-01-19 LAB — BASIC METABOLIC PANEL
Anion gap: 7 (ref 5–15)
BUN: 32 mg/dL — ABNORMAL HIGH (ref 8–23)
CO2: 22 mmol/L (ref 22–32)
Calcium: 9 mg/dL (ref 8.9–10.3)
Chloride: 118 mmol/L — ABNORMAL HIGH (ref 98–111)
Creatinine, Ser: 1.13 mg/dL (ref 0.61–1.24)
GFR, Estimated: >60 mL/min (ref >60)
Glucose, Bld: 124 mg/dL — ABNORMAL HIGH (ref 70–99)
Potassium: 4 mmol/L (ref 3.5–5.1)
Sodium: 147 mmol/L — ABNORMAL HIGH (ref 135–145)

## 2023-01-19 LAB — APTT
aPTT: 55 s — ABNORMAL HIGH (ref 24–36)
aPTT: 59 seconds — ABNORMAL HIGH (ref 24–36)

## 2023-01-19 LAB — CBC
HCT: 31.1 % — ABNORMAL LOW (ref 39.0–52.0)
Hemoglobin: 10.2 g/dL — ABNORMAL LOW (ref 13.0–17.0)
MCH: 36.2 pg — ABNORMAL HIGH (ref 26.0–34.0)
MCHC: 32.8 g/dL (ref 30.0–36.0)
MCV: 110.3 fL — ABNORMAL HIGH (ref 80.0–100.0)
Platelets: 144 10*3/uL — ABNORMAL LOW (ref 150–400)
RBC: 2.82 MIL/uL — ABNORMAL LOW (ref 4.22–5.81)
RDW: 15.2 % (ref 11.5–15.5)
WBC: 12.7 10*3/uL — ABNORMAL HIGH (ref 4.0–10.5)
nRBC: 0 % (ref 0.0–0.2)

## 2023-01-19 LAB — GLUCOSE, CAPILLARY
Glucose-Capillary: 139 mg/dL — ABNORMAL HIGH (ref 70–99)
Glucose-Capillary: 133 mg/dL — ABNORMAL HIGH (ref 70–99)
Glucose-Capillary: 103 mg/dL — ABNORMAL HIGH (ref 70–99)
Glucose-Capillary: 101 mg/dL — ABNORMAL HIGH (ref 70–99)
Glucose-Capillary: 93 mg/dL (ref 70–99)
Glucose-Capillary: 74 mg/dL (ref 70–99)

## 2023-01-19 LAB — HEPARIN LEVEL (UNFRACTIONATED): Heparin Unfractionated: >1.1 IU/mL — ABNORMAL HIGH (ref 0.30–0.70)

## 2023-01-19 LAB — PHOSPHORUS: Phosphorus: 3.7 mg/dL (ref 2.5–4.6)

## 2023-01-19 LAB — MAGNESIUM: Magnesium: 2.4 mg/dL (ref 1.7–2.4)

## 2023-01-19 MED ORDER — SODIUM CHLORIDE 0.9 % IV SOLN
0.5000 ug/min | INTRAVENOUS | Status: DC
  Administered 2023-01-19: 5 ug/min via INTRAVENOUS
  Filled 2023-01-19: qty 10

## 2023-01-19 MED ORDER — LIDOCAINE 5 % EX PTCH
1.0000 | MEDICATED_PATCH | CUTANEOUS | Status: DC
  Administered 2023-01-19 – 2023-02-11 (×21): 1 via TRANSDERMAL
  Filled 2023-01-19 (×23): qty 1

## 2023-01-19 MED ORDER — FENTANYL CITRATE (PF) 2500 MCG/50ML IJ SOLN
25.0000 ug/h | Status: DC
  Filled 2023-01-19: qty 100

## 2023-01-19 MED ORDER — ONDANSETRON HCL 4 MG/2ML IJ SOLN
4.0000 mg | Freq: Four times a day (QID) | INTRAMUSCULAR | Status: DC | PRN
  Administered 2023-01-19: 4 mg via INTRAVENOUS
  Filled 2023-01-19 (×2): qty 2

## 2023-01-19 MED ORDER — FUROSEMIDE 10 MG/ML IJ SOLN
40.0000 mg | Freq: Once | INTRAMUSCULAR | Status: DC
  Filled 2023-01-19: qty 4

## 2023-01-19 NOTE — Procedures (Signed)
Extubation Procedure Note  Patient Details:   Name: Curtis Ramos DOB: [birth year] MRN: [id number]   Airway Documentation:    Vent end date: 01/19/23 Vent end time: 1137   Evaluation  O2 sats: stable throughout Complications: No apparent complications Patient did tolerate procedure well. Bilateral Breath Sounds: Rhonchi, Diminished   Yes  Positive cuff leak noted. Patient extubated to [facility] with humidity. Patient has a productive cough and is vomiting.  Resident aware, RN getting medication to help with nausea. RT assisted with oral suctioning until RN back at bedside. RT will continue to monitor.   Bayard Beaver 01/19/2023, 11:52 AM

## 2023-01-19 NOTE — Progress Notes (Signed)
ANTICOAGULATION CONSULT NOTE - Follow Up Consult  Pharmacy Consult for heparin Indication: atrial fibrillation  Labs: Recent Labs    01/17/23 0318 01/18/23 0522 01/18/23 1247 01/18/23 1843 01/19/23 0523 01/19/23 1007 01/19/23 1453  HGB 12.0* 11.9*  --   --  10.2* 9.9*  --   HCT 35.3* 34.3*  --   --  31.1* 29.0*  --   PLT 130* 134*  --   --  144*  --   --   APTT  --   --   --  43* 55*  --  59*  HEPARINUNFRC  --   --   --  >1.10* >1.10*  --   --   CREATININE 1.15 1.24 1.25*  --  1.13  --   --      Assessment: 89yo male remains slightly subtherapeutic (aPTT 59) on heparin 1000 units/hr; no infusion issues or signs of bleeding per RN.  Goal of Therapy:  aPTT 66-85 seconds   Plan:  Will increase heparin infusion to 1100 units/hr F/u 8 hr aPTT  Sherlon Handing, PharmD, BCPS Please see amion for complete clinical pharmacist phone list 01/19/2023,4:03 PM

## 2023-01-19 NOTE — Progress Notes (Signed)
ANTICOAGULATION CONSULT NOTE - Follow Up Consult  Pharmacy Consult for heparin Indication: atrial fibrillation  Labs: Recent Labs    01/17/23 0318 01/18/23 0522 01/18/23 1247 01/18/23 1843 01/19/23 0523  HGB 12.0* 11.9*  --   --  10.2*  HCT 35.3* 34.3*  --   --  31.1*  PLT 130* 134*  --   --  144*  APTT  --   --   --  43* 55*  HEPARINUNFRC  --   --   --  >1.10* >1.10*  CREATININE 1.15 1.24 1.25*  --   --     Assessment: 89yo male remains subtherapeutic on heparin after rate change; no infusion issues or signs of bleeding per RN.  Goal of Therapy:  aPTT 66-85 seconds   Plan:  Will increase heparin infusion by 2 units/kg/hr to 1000 units/hr and check PTT in 8 hours.    Wynona Neat, PharmD, BCPS  01/19/2023,6:05 AM

## 2023-01-19 NOTE — Progress Notes (Signed)
Palliative: Mr. Testerman is lying quietly in bed.  He is intubated/ventilated but is off of sedation for SBT.  He will make an somewhat keep eye contact.  His son/HCPOA, [name], is present at bedside.  We talk about ventilator support and plan for SBT.  Overall, Mr. Herma Mering family remains optimistic and hopeful for return to his former functional status.  [facility] Mr. Fabry was extubated just prior to lunch.  Hopefully he will be able to remain off ventilator support.  Conference with attending resident, attending, bedside nursing staff, transition of care team related to patient condition, needs, goals of care, disposition.  Plan:   At this point continue full scope/full code.  Time for outcomes.  Would benefit from LTAC.    As Mr. Dent[place]ont continues to stabilize off of ventilator support, would be beneficial to have goals of care/CODE STATUS discussions with him.        108 minutes  Quinn Axe, NP Palliative medicine team Team phone [phone] Greater than 50% of this time was spent counseling and coordinating care related to the above assessment and plan.

## 2023-01-19 NOTE — Progress Notes (Signed)
Advanced Heart Failure Rounding Note  PCP-Cardiologist: None   Subjective:   2/29 Admitted PEA. CPR ~ 9 min 4 rounds of epi. Intubated and placed on pressors.  3/3 Extubated. Decompensated. Reintubated. Started on dopamine for bradycardia. Palliative Care consulted. -->Full code.  3/4 Diuresed with IV lasix. Negative 2.8 liters.  3/5 Switched to Epi  Currently on Epi 9.5 mcg + Heparin drip.   Awake on vent.  Objective:   Weight Range: 60 kg Body mass index is 18.45 kg/m.   Vital Signs:   Temp:  [98.3 F (36.8 C)-100.3 F (37.9 C)] 99.8 F (37.7 C) (03/06 0810) Pulse Rate:  [64-79] 64 (03/06 0728) Resp:  [29-33] 32 (03/06 0728) BP: (96-141)/(49-74) 141/54 (03/06 0728) SpO2:  [100 %] 100 % (03/06 0728) Arterial Line BP: (92-147)/(43-64) 147/56 (03/06 0600) FiO2 (%):  [40 %] 40 % (03/06 0728) Weight:  [60 kg] 60 kg (03/06 0500) Last BM Date : 01/16/23  Weight change: Filed Weights   01/17/23 0319 01/18/23 0500 01/19/23 0500  Weight: 54.1 kg 58.7 kg 60 kg    Intake/Output:   Intake/Output Summary (Last 24 hours) at 01/19/2023 0818 Last data filed at 01/19/2023 0600 Gross per 24 hour  Intake 2902.36 ml  Output 3035 ml  Net -132.64 ml   CVP 2-3  Physical Exam  General:   No resp difficulty HEENT: ETT Neck: supple. no JVD. Carotids 2+ bilat; no bruits. No lymphadenopathy or thryomegaly appreciated. Cor: PMI nondisplaced. Regular rate & rhythm. No rubs, gallops or murmurs. Lungs: clear Abdomen: soft, nontender, nondistended. No hepatosplenomegaly. No bruits or masses. Good bowel sounds. Extremities: no cyanosis, clubbing, rash, edema Neuro: Intubated. Follows commands.  MAE x4 Telemetry  SB with PVCs   Labs    CBC Recent Labs    01/18/23 0522 01/19/23 0523  WBC 12.6* 12.7*  HGB 11.9* 10.2*  HCT 34.3* 31.1*  MCV 105.5* 110.3*  PLT 134* [id number]*   Basic Metabolic Panel Recent Labs    01/18/23 1247 01/19/23 0523  NA 146* 147*  K 3.9 4.0  CL 116*  118*  CO2 23 22  GLUCOSE 163* 124*  BUN 43* 32*  CREATININE 1.25* 1.13  CALCIUM 9.6 9.0  MG  --  2.4  PHOS 1.6* 3.7   Liver Function Tests Recent Labs    01/17/23 0318 01/18/23 0522  AST 24 17  ALT 62* 46*  ALKPHOS 71 81  BILITOT 1.4* 1.3*  PROT 5.5* 5.8*  ALBUMIN 2.5* 2.4*   No results for input(s): "LIPASE", "AMYLASE" in the last 72 hours. Cardiac Enzymes No results for input(s): "CKTOTAL", "CKMB", "CKMBINDEX", "TROPONINI" in the last 72 hours.  BNP: BNP (last 3 results) Recent Labs    12/07/22 1307 12/31/22 1156 01/13/23 1344  BNP >4,500.0* 1,669.4* 1,073.1*    ProBNP (last 3 results) Recent Labs    06/18/22 1331 10/05/22 0848  PROBNP 1,795* 3,434*     D-Dimer No results for input(s): "DDIMER" in the last 72 hours. Hemoglobin A1C No results for input(s): "HGBA1C" in the last 72 hours. Fasting Lipid Panel Recent Labs    01/17/23 0318  TRIG 87   Thyroid Function Tests No results for input(s): "TSH", "T4TOTAL", "T3FREE", "THYROIDAB" in the last 72 hours.  Invalid input(s): "FREET3"  Other results:   Imaging    No results found.   Medications:     Scheduled Medications:  Chlorhexidine Gluconate Cloth  6 each Topical Daily   docusate  100 mg Per Tube BID  feeding supplement (PROSource TF20)  60 mL Per Tube Daily   free water  150 mL Per Tube Q4H   insulin aspart  0-9 Units Subcutaneous Q4H   levothyroxine  175 mcg Per Tube Daily   mouth rinse  15 mL Mouth Rinse Q2H   pantoprazole (PROTONIX) IV  40 mg Intravenous QHS   polyethylene glycol  17 g Per Tube Daily   thiamine  100 mg Per Tube Daily    Infusions:  sodium chloride 10 mL/hr at 01/19/23 0600   ceFEPime (MAXIPIME) IV Stopped (01/19/23 0028)   epinephrine 9.5 mcg/min (01/19/23 0652)   feeding supplement (OSMOLITE 1.5 CAL) 1,000 mL (01/19/23 0421)   fentaNYL infusion INTRAVENOUS 85 mcg/hr (01/19/23 0600)   heparin 1,000 Units/hr (01/19/23 0606)   propofol (DIPRIVAN) infusion  25 mcg/kg/min (01/19/23 0600)    PRN Medications: HYDROmorphone (DILAUDID) injection, lip balm, mouth rinse    Patient Profile  Curtis Ramos is a 89 year old with a history of HFrEF, PVCs, LBBB, MAI infection, GERD, HTN, hypothyroidism, and thyroid cancer.   Admitted  PEA arrest/shock. CPR  ~ 9 min -->nondisplaced transverse fracture of mid-lower sternal body, acute fx of costal cartilage of bilateral 2nd-6th ribs + 7th costal cartilage, acute fx of anterior 2nd-7th ribs.    Assessment/Plan   1. Cardiac Arrest--> Shock --> cardiogenic shock - PEA arrest--> 9 min CPR 4 rounds epi. Unshockable rhythm.  - Rib fracture 2-7 ribs  - Now on Epi 9.5 mcg --> Maintain SBP 110 . Continue to wean.    2. A/C HFrEF, NICM -->Cardiogenic shock - Echo 5/20 EF 20-25% - Echo 12/21 EF 55-60% - Echo  11/23   EF 20-25% LV markedly dilated + dyssynchrony - RV ok  Moderate MR/TR severe biatrial enlargement  - Cath 2012: No CAD (cath not repeated as low suspicion for iCM and CKD 3b) - cMRI 2/24: LBBB, LV EF 17%. RV EF 33%. Severe LAEt. No LGE.  - Zio 2/24  AFL 100% avg rat 67 PVC 1.8% - Now on Epi 9.5 mcg --> Maintain SBP 110 . Continue to wean.  - GDMT on hold while on pressors.    3. Acute Hypoxic Respiratory Failure in setting of cardiac arrest and aspiration PNA/sternal fractures - Extubated 3/3 but decompensated and required reintubation. - Continue abx (unasyn has high salt load watch fluid status) - Possible extubation today.   4. Chronic AF and frequent PVCs - has been on amio as outpatient.  - Continue to hold amio with bradycardia.  - off AC with fractures-.  - Continue  heparin drip. Down the road switch to Eliquis.  - In SB today  5. LBBB - was being worked up for CRT  6. Rib/sternal fractures + scalp hematoma - due to CPR.  -CT - R scalp hematoma. No acute intracranial abnormality.     7. AKI due to ATN/shock - Creatinine baseline~ 1.6 - Resolved.   8. Hypothyroidism  -  H/O Thyroid cancer. TSH 29 T4 1.26 - Suspect in the setting of acute event.   9. Shock liver - due to arrest -> LFTs continue to improve.   10. Hypernatremia - Na 147  11. Bradycardia -He has not been on nodal blockers. Off amio on admit  - Bradycardic today.  --may need EP .   12. [facility] following. Full Code.     Length of Stay: [place], NP  01/19/2023, 8:18 AM  Advanced Heart Failure Team Pager [phone] (  M-F; 7a - 5p)  Please contact [facility] for night-coverage after hours (5p -7a ) and weekends on [email or web address]

## 2023-01-19 NOTE — Progress Notes (Signed)
Bag of unopened IV Fentanyl (31mg/ml concentration) returned to pharmacy by RN.

## 2023-01-19 NOTE — Progress Notes (Signed)
NAME:  HA KRENGEL, MRN:  [id number], DOB:  Nov 15, 1932, LOS: 6 ADMISSION DATE:  01/13/2023, CONSULTATION DATE:  01/13/2023 REFERRING MD:  Trauma, CHIEF COMPLAINT:  Cardiac arrest, post-fall (Level 1 Trauma)   History of Present Illness:  89 year old man who presented to [facility] ED 2/29 via EMS after unwitnessed fall at work. PMHx significant for HTN, HFrEF with NICM (cMRI 12/2022 with severe LV dilation, EF 17%, LBBB, followed by Dr. Haroldine Laws), AFib (on Eliquis), MAI infection, GERD, hypothyroidism, history of thyroid CA.   History obtained from chart review and from family. Patient presented to [facility] ED via EMS after a fall at work; per family, patient did not have any complaints prior to fall. Recently undergoing workup with Cardiology for pacemaker placement. He was found down between a radiator and the wall, unresponsive with contusion and deformities to R side of his head. Initially with tachycardic with EMS, then bradycardic. ST elevation noted on EKG and Code STEMI was called, later called off as ST elevation had resolved. Patient lost pulses while in ED and became apneic with cardiac monitoring showing PEA arrest. CPR administered x 9 minutes with Epi x 4 and ROSC, patient never had a shockable rhythm. Intubated peri-arrest. Cards was consulted, noting high risk of VT/VF in the setting of severe CM. AHF was also notified of admission.   Labs demonstrated WBC 13.9, Hgb 14.1, Plt 169. INR 2.2 (on Eliquis). Na 138, K 4.1, CO2 18, Cr 1.79 (baseline ~1.3). CK 205. LA 7.0 > 4.6. Trop 27 > 155. BNP 1073. UA with glucose > 500, +protein. PCT < 0.10. CT Head NAICA, R parietal scalp hematoma. CT C-spine negative for acute findings. CT Chest/A/P with nondisplaced transverse fracture of mid-lower sternal body, acute fx of costal cartilage of bilateral 2nd-6th ribs + 7th costal cartilage, acute fx of anterior 2nd-7th ribs; pulmonary lobular interstitial thickening (?pulm edema), BLL opacities, trace ascites.  Empiric Unasyn started for ?aspiration.   PCCM was consulted for ICU admission. Pertinent  Medical History   Past Medical History:  Diagnosis Date   Acute on chronic combined systolic and diastolic CHF (congestive heart failure) ([place]) 03/19/2019   AKI (acute kidney injury) ([place]) 12/26/2014   Arthralgia 11/04/2014   Arthritis    "proabably"   Atypical mycobacterium infection    Cancer [facility])    family unaware of this hx on [id number]   Chronic systolic heart failure (HCC)    Decreased oral intake 12/26/2014   Decreased oral intake 12/26/2014   Depressed left ventricular ejection fraction 02/05/2020   Dilated cardiomyopathy ([place]) 03/19/2019   Ejection fraction 20 to 25% based on echocardiogram done by pulmonologist month ago.   DJD (degenerative joint disease) 10/07/2014   Dyspnea on exertion 03/19/2019   Essential hypertension 02/05/2020   GERD (gastroesophageal reflux disease)    Hammer toes of both feet 01/30/2018   History of thyroid cancer 12/16/2016   Hypercalcemia 12/26/2014   Hypothyroidism    Hypothyroidism associated with surgical procedure 10/07/2014   Lung mass    Myalgia 11/04/2014   Mycobacterium avium complex ([place])    Nail dystrophy 01/30/2018   Occult malignancy (HCC)    PAF (paroxysmal atrial fibrillation) ([place]) 02/05/2020   Persistent atrial fibrillation ([place]) 03/19/2019   Chads 2 Vascor equals 3   PONV (postoperative nausea and vomiting)    Pre-ulcerative calluses 01/30/2018   Pseudoaneurysm following procedure ([place]) 04/16/2020   Sensorineural hearing loss (SNHL), bilateral 08/11/2018   Shingles    Toenail fungus 01/30/2018   Wears  glasses    Significant Hospital Events: Including procedures, antibiotic start and stop dates in addition to other pertinent events   2/29 - Presented to [facility] ED after 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 Found between radiator and wall with R-sided head injury. Suspected cardiac etiology of unresponsiveness. On ED arrival,  became pulseless (rhythm PEA). CPR x 9 min, Epi x 4 with ROSC. Intubated peri-arrest. CT Head, C-Spine, Chest/A/P completed (as above). Unasyn for ?aspiration. R axillary A-line, L subclavian CVC, ETT tube exchange + bronch.  3/3 - Extubated, back into respiratory failure, reintubated 3/3 - OG tube placement complications, completely replaced, still trouble with placement 3/4 - Started cefepime for respiratory culture showing abundant pseudomonas  Interim History / Subjective:   No changes overnight. Volume status continues to improve with IV lasix.   Objective   Blood pressure (!) 141/54, pulse 64, temperature 99.4 F (37.4 C), temperature source Oral, resp. rate (!) 32, height '5\' 11"'$  (1.803 m), weight 60 kg, SpO2 100 %. CVP:  [1 mmHg-12 mmHg] 12 mmHg  Vent Mode: PRVC FiO2 (%):  [40 %] 40 % Set Rate:  [32 bmp] 32 bmp Vt Set:  [600 mL] 600 mL PEEP:  [5 cmH20] 5 cmH20 Plateau Pressure:  [12 cmH20-20 cmH20] 20 cmH20   Intake/Output Summary (Last 24 hours) at 01/19/2023 0804 Last data filed at 01/19/2023 0600 Gross per 24 hour  Intake 2902.36 ml  Output 3035 ml  Net -132.64 ml    Filed Weights   01/17/23 0319 01/18/23 0500 01/19/23 0500  Weight: 54.1 kg 58.7 kg 60 kg   Examination: Constitutional:Ill-appearing elderly gentleman. In no acute distress. Cardio:Regular rate with irregular rhythm, I suspect PVCs. No murmurs, rubs, or gallops. Pulm:Clear to auscultation bilaterally. On full ventilatory support. Abdomen:Soft, nontender, non-distended. [id number] for extremity edema.  Skin:Warm and dry. Neuro:Opens eyes to voice. Follows commands, nods yes or shakes head no to simple questions. Shrugs shoulders.  Resolved Hospital Problem list   AKI Acute encephalopathy, multifactorial Lactic acidosis Hypokalemia Elevated liver enzymes Assessment & Plan:  Acute hypoxemic respiratory failure 2/2 VAP and pulmonary edema Acute on chronic systolic heart failure, LF EV 17% Atelectasis  2/2 sternum and rib fractures sustained during CPR Severe nonischemic cardiomyopathy Atrial fibrillation Leukocytosis Remains on full ventilatory support. Leukocytosis overall unchanged, 12.7. He had borderline elevated temperature overnight to 100.3.  Repeat respiratory culture 03/03 growing abundant pseudomonas. Currently on cefepime.   He received 40 mg IV lasix 03/05 with UOP of 3L. Overall is net +1.4L since admission. Renal function tolerating diuresis well.  Hopeful for successful SBT and possible extubation later today. I have consulted SW to discuss LTAC with the family in the event that weaning to extubation is prolonged.  Advanced HF team following, appreciate their recommendations. Currently holding home amiodarone and GDMT for HF.  Plan: -Continue cefepime -IV lasix 40 mg x1; monitor I's/O's -SBT today once off of sedation; extubate if he tolerates this well -Otherwise continue full ventilator support; wean FiO2 for O2 sat > 90% and  daily SBT as tolerated; ensure off of sedation prior to trial -ABG now -VAP bundle, pulmonary hygiene -PAD protocol for sedation: propofol and fentanyl for goal RASS -1 -Advanced HF team following -Trend WBC, fever curve -Continue cardiac monitoring, CVP -Ongoing GOC conversations with family  Post-PEA arrest Multifactorial shock Now on epinephrine, MAP 77. Co-ox panel with O2 saturation 91%. Previously concerned for septic shock in setting of aspiration, compounded further by advanced heart failure and  possible superimposed cardiogenic shock. Plan: -Will transition to epinephrine and discontinue levophed and dopamine -Cautious IVF resuscitation if needed -Trend co-ox -Infectious management as above  Thrombocytopenia Likely reactive. Improving daily, now 144. Plan: -Trend CBC  Hypernatremia Na 147, overall unchanged. He has been getting FWF and diuresis which is likely contributing to mild hypernatremia. Plan: -Continue FWF -Trend  BMP  Hx hypothyroidism Plan: -Continue home levothyroxine 175 mcg daily per tube -Recommend repeat labs as outpatient once acute illness has resolved   Nutrition needs Plan: -Tube feeds w/ free water flushes -SSI, CBG monitoring; goal CBG 140-180  Scalp hematoma Rib fractures Sternum fracture Sustained during CPR.  Plan: -Will order lidocaine patch for chest wall  Best Practice (right click and "Reselect all SmartList Selections" daily)   Diet/type: tubefeeds DVT prophylaxis: prophylactic heparin  GI prophylaxis: PPI Lines: Central line and Arterial Line Foley:  Yes, and it is still needed Code Status:  full code Last date of multidisciplinary goals of care discussion [02/29]  Labs   CBC: Recent Labs  Lab 01/15/23 0400 01/16/23 0207 01/16/23 1023 01/16/23 1206 01/16/23 2058 01/17/23 0318 01/18/23 0522 01/19/23 0523  WBC 13.1* 15.4*  --   --   --  13.3* 12.6* 12.7*  HGB 13.9 12.2*   < > 11.6* 11.9* 12.0* 11.9* 10.2*  HCT 40.1 36.3*   < > 34.0* 35.0* 35.3* 34.3* 31.1*  MCV 105.5* 108.4*  --   --   --  108.6* 105.5* 110.3*  PLT 148* 143*  --   --   --  130* 134* 144*   < > = values in this interval not displayed.     Basic Metabolic Panel: Recent Labs  Lab 01/14/23 1638 01/15/23 0400 01/15/23 1637 01/16/23 0207 01/16/23 1023 01/16/23 2058 01/17/23 0318 01/18/23 0522 01/18/23 1247 01/19/23 0523  NA  --  142  --  146*   < > 151* 149* 149* 146* 147*  K  --  3.5  --  4.0   < > 3.8 3.6 2.9* 3.9 4.0  CL  --  114*  --  115*  --   --  117* 117* 116* 118*  CO2  --  20*  --  23  --   --  21* '25 23 22  '$ GLUCOSE  --  116*  --  129*  --   --  106* 111* 163* 124*  BUN  --  32*  --  32*  --   --  32* 41* 43* 32*  CREATININE  --  1.44*  --  1.24  --   --  1.15 1.24 1.25* 1.13  CALCIUM  --  9.1  --  9.7  --   --  10.1 9.8 9.6 9.0  MG 2.2 2.1 2.1 2.3  --   --   --   --   --  2.4  PHOS 3.3 2.3* 2.7 2.4*  --   --  2.0*  --  1.6* 3.7   < > = values in this interval not  displayed.    GFR: Estimated Creatinine Clearance: 38.3 mL/min (by C-G formula based on SCr of 1.13 mg/dL). Recent Labs  Lab 01/13/23 1344 01/13/23 1635 01/14/23 0333 01/14/23 0821 01/14/23 1320 01/15/23 0400 01/16/23 0207 01/17/23 0318 01/17/23 0945 01/18/23 0522 01/19/23 0523  PROCALCITON <0.10  --   --   --   --   --   --   --   --   --   --  WBC  --   --    < >  --   --    < > 15.4* 13.3*  --  12.6* 12.7*  LATICACIDVEN 4.6* 2.9*  --  3.6* 5.0*  --   --   --  1.7  --   --    < > = values in this interval not displayed.     Liver Function Tests: Recent Labs  Lab 01/14/23 0333 01/15/23 0400 01/16/23 0207 01/17/23 0318 01/18/23 0522  AST 191* 44* '29 24 17  '$ ALT 228* 117* 81* 62* 46*  ALKPHOS 111 82 71 71 81  BILITOT 1.2 1.5* 1.5* 1.4* 1.3*  PROT 6.2* 5.7* 5.8* 5.5* 5.8*  ALBUMIN 3.1* 2.6* 2.6* 2.5* 2.4*    No results for input(s): "LIPASE", "AMYLASE" in the last 168 hours. No results for input(s): "AMMONIA" in the last 168 hours.  ABG    Component Value Date/Time   PHART 7.426 01/16/2023 2058   PCO2ART 36.4 01/16/2023 2058   PO2ART 88 01/16/2023 2058   HCO3 23.9 01/16/2023 2058   TCO2 25 01/16/2023 2058   ACIDBASEDEF 4.0 (H) 01/16/2023 1206   O2SAT 91 01/19/2023 0523     Coagulation Profile: Recent Labs  Lab 01/13/23 1223  INR 2.2*     Cardiac Enzymes: Recent Labs  Lab 01/13/23 1209  CKTOTAL 205     HbA1C: Hgb A1c MFr Bld  Date/Time Value Ref Range Status  01/13/2023 12:23 PM 5.9 (H) 4.8 - 5.6 % Final    Comment:    (NOTE)         Prediabetes: 5.7 - 6.4         Diabetes: >6.4         Glycemic control for adults with diabetes: <7.0     CBG: Recent Labs  Lab 01/18/23 1503 01/18/23 1925 01/18/23 2318 01/19/23 0301 01/19/23 0736  GLUCAP 178* 160* 132* 139* 133*     Past Medical History:  He,  has a past medical history of Acute on chronic combined systolic and diastolic CHF (congestive heart failure) ([place]) (03/19/2019), AKI  (acute kidney injury) ([place]) (12/26/2014), Arthralgia (11/04/2014), Arthritis, Atypical mycobacterium infection, Cancer ([place]), Chronic systolic heart failure ([place]), Decreased oral intake (12/26/2014), Decreased oral intake (12/26/2014), Depressed left ventricular ejection fraction (02/05/2020), Dilated cardiomyopathy ([place]) (03/19/2019), DJD (degenerative joint disease) (10/07/2014), Dyspnea on exertion (03/19/2019), Essential hypertension (02/05/2020), GERD (gastroesophageal reflux disease), Hammer toes of both feet (01/30/2018), History of thyroid cancer (12/16/2016), Hypercalcemia (12/26/2014), Hypothyroidism, Hypothyroidism associated with surgical procedure (10/07/2014), Lung mass, Myalgia (11/04/2014), Mycobacterium avium complex ([place]), Nail dystrophy (01/30/2018), Occult malignancy ([place]), PAF (paroxysmal atrial fibrillation) ([place]) (02/05/2020), Persistent atrial fibrillation ([place]) (03/19/2019), PONV (postoperative nausea and vomiting), Pre-ulcerative calluses (01/30/2018), Pseudoaneurysm following procedure ([place]) (04/16/2020), Sensorineural hearing loss (SNHL), bilateral (08/11/2018), Shingles, Toenail fungus (01/30/2018), and Wears glasses.   Surgical History:   Past Surgical History:  Procedure Laterality Date   APPENDECTOMY     CATARACT EXTRACTION W/ INTRAOCULAR LENS  IMPLANT, BILATERAL Bilateral    COLONOSCOPY     INGUINAL HERNIA REPAIR Left    LEFT HEART CATH AND CORONARY ANGIOGRAPHY N/A 03/11/2020   Procedure: LEFT HEART CATH AND CORONARY ANGIOGRAPHY;  Surgeon: Jettie Booze, MD;  Location: [place] [facility];  Service: Cardiovascular;  Laterality: N/A;   MASS EXCISION Right 08/27/2014   Procedure: EXCISION MASS RIGHT PALM/INDEX;  Surgeon: Leanora Cover, MD;  Location: [place];  Service: Orthopedics;  Laterality: Right;   THYROIDECTOMY  2003  Social History:   reports that he has never smoked. He has never used smokeless tobacco. He reports that he does not drink alcohol and does not  use drugs.   Family History:  His family history is not on file.   Allergies No Known Allergies   Home Medications  Prior to Admission medications   Medication Sig Start Date End Date Taking? Authorizing Provider  amiodarone (PACERONE) 200 MG tablet Take 200 mg by mouth daily.    [provider]  carvedilol (COREG) 6.25 MG tablet Take 1 tablet (6.25 mg total) by mouth 2 (two) times daily. 12/31/22   Bensimhon, Shaune Pascal, MD  dapagliflozin propanediol (FARXIGA) 10 MG TABS tablet Take 1 tablet (10 mg total) by mouth daily before breakfast. 10/18/22   Park Liter, MD  DOCUSATE SODIUM PO Take 1 tablet by mouth daily.    [provider]  ELIQUIS 5 MG TABS tablet TAKE 1 TABLET BY MOUTH TWICE A DAY 12/03/22   [place] Liter, MD  furosemide (LASIX) 20 MG tablet Take 4 tablets (80 mg total) by mouth daily. 12/07/22   Bensimhon, Shaune Pascal, MD  polyethylene glycol (MIRALAX / GLYCOLAX) 17 g packet Take 17 g by mouth daily.    [provider]  sacubitril-valsartan (ENTRESTO) 49-51 MG Take 1 tablet by mouth 2 (two) times daily. 11/18/22   Park Liter, MD  SYNTHROID 175 MCG tablet Take 175 mcg by mouth daily. 05/16/22   [provider]         Farrel Gordon, [facility] PGY-2 [phone]

## 2023-01-20 ENCOUNTER — Ambulatory Visit: Payer: Medicare Other | Admitting: Cardiology

## 2023-01-20 DIAGNOSIS — Z7189 Other specified counseling: Secondary | ICD-10-CM | POA: Diagnosis not present

## 2023-01-20 DIAGNOSIS — J9601 Acute respiratory failure with hypoxia: Secondary | ICD-10-CM | POA: Diagnosis not present

## 2023-01-20 DIAGNOSIS — Z515 Encounter for palliative care: Secondary | ICD-10-CM | POA: Diagnosis not present

## 2023-01-20 DIAGNOSIS — I469 Cardiac arrest, cause unspecified: Secondary | ICD-10-CM | POA: Diagnosis not present

## 2023-01-20 LAB — COMPREHENSIVE METABOLIC PANEL
ALT: 47 U/L — ABNORMAL HIGH (ref 0–44)
AST: 37 U/L (ref 15–41)
Albumin: 2.6 g/dL — ABNORMAL LOW (ref 3.5–5.0)
Alkaline Phosphatase: 110 U/L (ref 38–126)
Anion gap: 8 (ref 5–15)
BUN: 38 mg/dL — ABNORMAL HIGH (ref 8–23)
CO2: 22 mmol/L (ref 22–32)
Calcium: 11 mg/dL — ABNORMAL HIGH (ref 8.9–10.3)
Chloride: 117 mmol/L — ABNORMAL HIGH (ref 98–111)
Creatinine, Ser: 0.98 mg/dL (ref 0.61–1.24)
GFR, Estimated: >60 mL/min (ref >60)
Glucose, Bld: 146 mg/dL — ABNORMAL HIGH (ref 70–99)
Potassium: 3.9 mmol/L (ref 3.5–5.1)
Sodium: 147 mmol/L — ABNORMAL HIGH (ref 135–145)
Total Bilirubin: 1.6 mg/dL — ABNORMAL HIGH (ref 0.3–1.2)
Total Protein: 7.1 g/dL (ref 6.5–8.1)

## 2023-01-20 LAB — GLUCOSE, CAPILLARY
Glucose-Capillary: 84 mg/dL (ref 70–99)
Glucose-Capillary: 77 mg/dL (ref 70–99)
Glucose-Capillary: 98 mg/dL (ref 70–99)
Glucose-Capillary: 97 mg/dL (ref 70–99)
Glucose-Capillary: 113 mg/dL — ABNORMAL HIGH (ref 70–99)
Glucose-Capillary: 134 mg/dL — ABNORMAL HIGH (ref 70–99)

## 2023-01-20 LAB — COOXEMETRY PANEL
Carboxyhemoglobin: 2.4 % — ABNORMAL HIGH (ref 0.5–1.5)
Carboxyhemoglobin: 2.6 % — ABNORMAL HIGH (ref 0.5–1.5)
Methemoglobin: <0.7 % (ref 0.0–1.5)
Methemoglobin: <0.7 % (ref 0.0–1.5)
O2 Saturation: 99.1 %
O2 Saturation: 85.3 %
Total hemoglobin: 10.8 g/dL — ABNORMAL LOW (ref 12.0–16.0)
Total hemoglobin: 11.3 g/dL — ABNORMAL LOW (ref 12.0–16.0)

## 2023-01-20 LAB — POCT I-STAT 7, (LYTES, BLD GAS, ICA,H+H)
Acid-base deficit: 2 mmol/L (ref 0.0–2.0)
Bicarbonate: 20.9 mmol/L (ref 20.0–28.0)
Calcium, Ion: 1.57 mmol/L (ref 1.15–1.40)
HCT: 33 % — ABNORMAL LOW (ref 39.0–52.0)
Hemoglobin: 11.2 g/dL — ABNORMAL LOW (ref 13.0–17.0)
O2 Saturation: 99 %
Patient temperature: 98.2
Potassium: 4 mmol/L (ref 3.5–5.1)
Sodium: 151 mmol/L — ABNORMAL HIGH (ref 135–145)
TCO2: 22 mmol/L (ref 22–32)
pCO2 arterial: 29.6 mmHg — ABNORMAL LOW (ref 32–48)
pH, Arterial: 7.456 — ABNORMAL HIGH (ref 7.35–7.45)
pO2, Arterial: 144 mmHg — ABNORMAL HIGH (ref 83–108)

## 2023-01-20 LAB — BASIC METABOLIC PANEL
Anion gap: 3 — ABNORMAL LOW (ref 5–15)
BUN: 29 mg/dL — ABNORMAL HIGH (ref 8–23)
CO2: 26 mmol/L (ref 22–32)
Calcium: 10.1 mg/dL (ref 8.9–10.3)
Chloride: 119 mmol/L — ABNORMAL HIGH (ref 98–111)
Creatinine, Ser: 1.02 mg/dL (ref 0.61–1.24)
GFR, Estimated: >60 mL/min (ref >60)
Glucose, Bld: 91 mg/dL (ref 70–99)
Potassium: 4.1 mmol/L (ref 3.5–5.1)
Sodium: 148 mmol/L — ABNORMAL HIGH (ref 135–145)

## 2023-01-20 LAB — CBC
HCT: 32 % — ABNORMAL LOW (ref 39.0–52.0)
HCT: 32.8 % — ABNORMAL LOW (ref 39.0–52.0)
Hemoglobin: 10.7 g/dL — ABNORMAL LOW (ref 13.0–17.0)
Hemoglobin: 11 g/dL — ABNORMAL LOW (ref 13.0–17.0)
MCH: 36.9 pg — ABNORMAL HIGH (ref 26.0–34.0)
MCH: 37 pg — ABNORMAL HIGH (ref 26.0–34.0)
MCHC: 33.4 g/dL (ref 30.0–36.0)
MCHC: 33.5 g/dL (ref 30.0–36.0)
MCV: 110.3 fL — ABNORMAL HIGH (ref 80.0–100.0)
MCV: 110.4 fL — ABNORMAL HIGH (ref 80.0–100.0)
Platelets: 135 10*3/uL — ABNORMAL LOW (ref 150–400)
Platelets: 165 10*3/uL (ref 150–400)
RBC: 2.9 MIL/uL — ABNORMAL LOW (ref 4.22–5.81)
RBC: 2.97 MIL/uL — ABNORMAL LOW (ref 4.22–5.81)
RDW: 15 % (ref 11.5–15.5)
RDW: 15.1 % (ref 11.5–15.5)
WBC: 10.4 10*3/uL (ref 4.0–10.5)
WBC: 12.7 10*3/uL — ABNORMAL HIGH (ref 4.0–10.5)
nRBC: 0 % (ref 0.0–0.2)
nRBC: 0 % (ref 0.0–0.2)

## 2023-01-20 LAB — APTT
aPTT: 61 seconds — ABNORMAL HIGH (ref 24–36)
aPTT: 74 seconds — ABNORMAL HIGH (ref 24–36)
aPTT: 74 seconds — ABNORMAL HIGH (ref 24–36)

## 2023-01-20 LAB — HEPARIN LEVEL (UNFRACTIONATED)
Heparin Unfractionated: >1.1 IU/mL — ABNORMAL HIGH (ref 0.30–0.70)
Heparin Unfractionated: >1.1 IU/mL — ABNORMAL HIGH (ref 0.30–0.70)

## 2023-01-20 LAB — PHOSPHORUS: Phosphorus: 2.7 mg/dL (ref 2.5–4.6)

## 2023-01-20 LAB — TRIGLYCERIDES: Triglycerides: 96 mg/dL (ref <150)

## 2023-01-20 LAB — MAGNESIUM: Magnesium: 2.7 mg/dL — ABNORMAL HIGH (ref 1.7–2.4)

## 2023-01-20 MED ORDER — ORAL CARE MOUTH RINSE
15.0000 mL | OROMUCOSAL | Status: DC
  Administered 2023-01-20 – 2023-02-11 (×78): 15 mL via OROMUCOSAL

## 2023-01-20 MED ORDER — OXIDIZED CELLULOSE EX PADS
1.0000 | MEDICATED_PAD | Freq: Once | CUTANEOUS | Status: AC
  Administered 2023-01-20: 1 via TOPICAL
  Filled 2023-01-20: qty 1

## 2023-01-20 MED ORDER — GLYCOPYRROLATE 0.2 MG/ML IJ SOLN
0.2000 mg | Freq: Three times a day (TID) | INTRAMUSCULAR | Status: AC
  Administered 2023-01-20 – 2023-01-21 (×3): 0.2 mg via INTRAVENOUS
  Filled 2023-01-20 (×3): qty 1

## 2023-01-20 MED ORDER — ORAL CARE MOUTH RINSE: 15.0000 mL | OROMUCOSAL | Status: DC | PRN

## 2023-01-20 MED ORDER — "THROMBI-PAD 3""X3"" EX PADS"
1.0000 | MEDICATED_PAD | Freq: Once | CUTANEOUS | Status: DC
  Filled 2023-01-20: qty 1

## 2023-01-20 NOTE — Progress Notes (Signed)
Advanced Heart Failure Rounding Note  PCP-Cardiologist: None   Subjective:   2/29 Admitted PEA. CPR ~ 9 min 4 rounds of epi. Intubated and placed on pressors.  3/3 Extubated. Decompensated. Reintubated. Started on dopamine for bradycardia. Palliative Care consulted. -->Full code.  3/4 Diuresed with IV lasix. Negative 2.8 liters.  3/5 Switched to Epi 3/6 extubated.   Off Epi. Remains on Heparin drip.   Complaining of chest soreness.  Objective:   Weight Range: 59.9 kg Body mass index is 18.42 kg/m.   Vital Signs:   Temp:  [97.9 F (36.6 C)-98.6 F (37 C)] 98.6 F (37 C) (03/07 0715) Pulse Rate:  [73-86] 76 (03/07 0700) Resp:  [22-33] 32 (03/07 0700) BP: (128-151)/(58-75) 131/67 (03/07 0400) SpO2:  [83 %-100 %] 96 % (03/07 0700) Arterial Line BP: (131-150)/(54-66) 137/61 (03/07 0700) FiO2 (%):  [40 %] 40 % (03/06 1007) Weight:  [59.9 kg] 59.9 kg (03/07 0421) Last BM Date : 01/16/23  Weight change: Filed Weights   01/18/23 0500 01/19/23 0500 01/20/23 0421  Weight: 58.7 kg 60 kg 59.9 kg    Intake/Output:   Intake/Output Summary (Last 24 hours) at 01/20/2023 1005 Last data filed at 01/20/2023 0528 Gross per 24 hour  Intake 735.28 ml  Output 2235 ml  Net -1499.72 ml   Physical Exam  General:  Appears weak. No resp difficulty HEENT: + [place] Neck: supple. no JVD. Carotids 2+ bilat; no bruits. No lymphadenopathy or thryomegaly appreciated. Cor: PMI nondisplaced. Regular rate & rhythm. No rubs, gallops or murmurs. Lungs: Decreased on 2 liters [place].  Abdomen: soft, nontender, nondistended. No hepatosplenomegaly. No bruits or masses. Good bowel sounds. Extremities: no cyanosis, clubbing, rash, edema Neuro: alert & orientedx3, cranial nerves grossly intact. moves all 4 extremities w/o difficulty. Affect  flat   Telemetry   SR 70s with occasional PVCs.  Labs    CBC Recent Labs    01/19/23 0523 01/19/23 1007 01/20/23 0425  WBC 12.7*  --  10.4  HGB 10.2* 9.9*  10.7*  HCT 31.1* 29.0* 32.0*  MCV 110.3*  --  110.3*  PLT 144*  --  [id number]*   Basic Metabolic Panel Recent Labs    01/18/23 1247 01/19/23 0523 01/19/23 1007 01/20/23 0425  NA 146* 147* 148* 148*  K 3.9 4.0 4.1 4.1  CL 116* 118*  --  119*  CO2 23 22  --  26  GLUCOSE 163* 124*  --  91  BUN 43* 32*  --  29*  CREATININE 1.25* 1.13  --  1.02  CALCIUM 9.6 9.0  --  10.1  MG  --  2.4  --   --   PHOS 1.6* 3.7  --   --    Liver Function Tests Recent Labs    01/18/23 0522  AST 17  ALT 46*  ALKPHOS 81  BILITOT 1.3*  PROT 5.8*  ALBUMIN 2.4*   No results for input(s): "LIPASE", "AMYLASE" in the last 72 hours. Cardiac Enzymes No results for input(s): "CKTOTAL", "CKMB", "CKMBINDEX", "TROPONINI" in the last 72 hours.  BNP: BNP (last 3 results) Recent Labs    12/07/22 1307 12/31/22 1156 01/13/23 1344  BNP >4,500.0* 1,669.4* 1,073.1*    ProBNP (last 3 results) Recent Labs    06/18/22 1331 10/05/22 0848  PROBNP 1,795* 3,434*     D-Dimer No results for input(s): "DDIMER" in the last 72 hours. Hemoglobin A1C No results for input(s): "HGBA1C" in the last 72 hours. Fasting Lipid Panel Recent Labs  01/20/23 0425  TRIG 96   Thyroid Function Tests No results for input(s): "TSH", "T4TOTAL", "T3FREE", "THYROIDAB" in the last 72 hours.  Invalid input(s): "FREET3"  Other results:   Imaging    No results found.   Medications:     Scheduled Medications:  Chlorhexidine Gluconate Cloth  6 each Topical Daily   docusate  100 mg Per Tube BID   feeding supplement (PROSource TF20)  60 mL Per Tube Daily   free water  150 mL Per Tube Q4H   insulin aspart  0-9 Units Subcutaneous Q4H   levothyroxine  175 mcg Per Tube Daily   lidocaine  1 patch Transdermal Q24H   mouth rinse  15 mL Mouth Rinse Q2H   polyethylene glycol  17 g Per Tube Daily    Infusions:  sodium chloride 10 mL/hr at 01/20/23 0500   ceFEPime (MAXIPIME) IV Stopped (01/19/23 2332)   feeding supplement  (OSMOLITE 1.5 CAL) 1,000 mL (01/20/23 0345)   heparin 1,100 Units/hr (01/20/23 0500)    PRN Medications: HYDROmorphone (DILAUDID) injection, lip balm, ondansetron (ZOFRAN) IV, mouth rinse    Patient Profile  Curtis Ramos is a 89 year old with a history of HFrEF, PVCs, LBBB, MAI infection, GERD, HTN, hypothyroidism, and thyroid cancer.   Admitted  PEA arrest/shock. CPR  ~ 9 min -->nondisplaced transverse fracture of mid-lower sternal body, acute fx of costal cartilage of bilateral 2nd-6th ribs + 7th costal cartilage, acute fx of anterior 2nd-7th ribs.    Assessment/Plan   1. Cardiac Arrest--> Shock --> cardiogenic shock - PEA arrest--> 9 min CPR 4 rounds epi. Unshockable rhythm.  - Rib fracture 2-7 ribs  - Off pressors.   2. A/C HFrEF, NICM -->Cardiogenic shock - Echo 5/20 EF 20-25% - Echo 12/21 EF 55-60% - Echo  11/23   EF 20-25% LV markedly dilated + dyssynchrony - RV ok  Moderate MR/TR severe biatrial enlargement  - Cath 2012: No CAD (cath not repeated as low suspicion for iCM and CKD 3b) - cMRI 2/24: LBBB, LV EF 17%. RV EF 33%. Severe LAEt. No LGE.  - Zio 2/24  AFL 100% avg rat 67 PVC 1.8% - Off pressors.  _ hold off on GDMT. Consider starting tomorrow.   3. Acute Hypoxic Respiratory Failure in setting of cardiac arrest and aspiration PNA/sternal fractures - Extubated 3/3 but decompensated and required reintubation. - Continue abx (unasyn has high salt load watch fluid status) - Extubated 01/19/23. Sats stable on 2 liters [place].   4. Chronic AF and frequent PVCs - has been on amio as outpatient.  - Continue to hold amio with bradycardia.  - off AC with fractures-.  - Continue  heparin drip. Down the road switch to Eliquis.  - SR today.   5. LBBB - was being worked up for CRT  6. Rib/sternal fractures + scalp hematoma - due to CPR.  -CT - R scalp hematoma. No acute intracranial abnormality.     7. AKI due to ATN/shock - Creatinine baseline~ 1.6 - Resolved.   8.  Hypothyroidism  - H/O Thyroid cancer. TSH 29 T4 1.26 - Suspect in the setting of acute event.   9. Shock liver - due to arrest -> LFTs continue to improve.   10. Hypernatremia - Na 147  11. Bradycardia -He has not been on nodal blockers. Off amio on admit  - Resolved.    12. GOC Palliative Care following. Full Code.   PT consult/Speech?  Length of Stay: 7  Curtis Ramos  Mariaclara Spear, NP  01/20/2023, 10:05 AM  Advanced Heart Failure Team Pager [phone] (M-F; 7a - 5p)  Please contact [facility] for night-coverage after hours (5p -7a ) and weekends on [email or web address]

## 2023-01-20 NOTE — Progress Notes (Signed)
Pharmacy was notified of bleeding from CVC site which seems to be increasing. Will recheck CBC and PTT

## 2023-01-20 NOTE — Progress Notes (Signed)
ANTICOAGULATION CONSULT NOTE - Follow Up Consult  Pharmacy Consult for heparin Indication: atrial fibrillation  Labs: Recent Labs    01/17/23 0318 01/18/23 0522 01/18/23 0522 01/18/23 1247 01/18/23 1843 01/19/23 0523 01/19/23 1007 01/19/23 1453 01/19/23 2347  HGB 12.0* 11.9*  --   --   --  10.2* 9.9*  --   --   HCT 35.3* 34.3*  --   --   --  31.1* 29.0*  --   --   PLT 130* 134*  --   --   --  144*  --   --   --   APTT  --   --    < >  --  43* 55*  --  59* 74*  HEPARINUNFRC  --   --   --   --  >1.10* >1.10*  --   --  >1.10*  CREATININE 1.15 1.24  --  1.25*  --  1.13  --   --   --    < > = values in this interval not displayed.     Assessment: 89yo male remains slightly subtherapeutic (aPTT 59) on heparin 1000 units/hr; no infusion issues or signs of bleeding per RN.  3/7 AM update:  aPTT therapeutic after rate increase  Goal of Therapy:  Heparin level 0.3-0.5 units/mL aPTT 66-85 seconds   Plan:  Cont heparin 1100 units/hr Heparin level and aPTT with AM labs  Narda Bonds, PharmD, [place] Pharmacist Phone: [phone]

## 2023-01-20 NOTE — Progress Notes (Addendum)
0330 took over care of patient patient on 4L [place] at 100% decreased to 2L [place] tube feeding not running no order or note to say why its stopped started at original order of 43m with goal being 55. Patient alert to slef and place gets year and month wrong follows commands noted upper ext weakness and visual deficit with assessment  0400 bath and chg completed 0440 labs sent

## 2023-01-20 NOTE — Evaluation (Signed)
Clinical/Bedside Swallow Evaluation Patient Details  Name: Curtis Ramos MRN: [id number] Date of Birth: 09-15-1932  Today's Date: 01/20/2023 Time: SLP Start Time (ACUTE ONLY): 1215 SLP Stop Time (ACUTE ONLY): 1225 SLP Time Calculation (min) (ACUTE ONLY): 10 min  Past Medical History:  Past Medical History:  Diagnosis Date   Acute on chronic combined systolic and diastolic CHF (congestive heart failure) ([place]) 03/19/2019   AKI (acute kidney injury) ([place]) 12/26/2014   Arthralgia 11/04/2014   Arthritis    "proabably"   Atypical mycobacterium infection    Cancer [facility])    family unaware of this hx on [id number]   Chronic systolic heart failure (HCC)    Decreased oral intake 12/26/2014   Decreased oral intake 12/26/2014   Depressed left ventricular ejection fraction 02/05/2020   Dilated cardiomyopathy ([place]) 03/19/2019   Ejection fraction 20 to 25% based on echocardiogram done by pulmonologist month ago.   DJD (degenerative joint disease) 10/07/2014   Dyspnea on exertion 03/19/2019   Essential hypertension 02/05/2020   GERD (gastroesophageal reflux disease)    Hammer toes of both feet 01/30/2018   History of thyroid cancer 12/16/2016   Hypercalcemia 12/26/2014   Hypothyroidism    Hypothyroidism associated with surgical procedure 10/07/2014   Lung mass    Myalgia 11/04/2014   Mycobacterium avium complex ([place])    Nail dystrophy 01/30/2018   Occult malignancy (HCC)    PAF (paroxysmal atrial fibrillation) ([place]) 02/05/2020   Persistent atrial fibrillation ([place]) 03/19/2019   Chads 2 Vascor equals 3   PONV (postoperative nausea and vomiting)    Pre-ulcerative calluses 01/30/2018   Pseudoaneurysm following procedure ([place]) 04/16/2020   Sensorineural hearing loss (SNHL), bilateral 08/11/2018   Shingles    Toenail fungus 01/30/2018   Wears glasses    Past Surgical History:  Past Surgical History:  Procedure Laterality Date   APPENDECTOMY     CATARACT EXTRACTION W/ INTRAOCULAR LENS  IMPLANT, BILATERAL  Bilateral    COLONOSCOPY     INGUINAL HERNIA REPAIR Left    LEFT HEART CATH AND CORONARY ANGIOGRAPHY N/A 03/11/2020   Procedure: LEFT HEART CATH AND CORONARY ANGIOGRAPHY;  Surgeon: Jettie Booze, MD;  Location: [facility];  Service: Cardiovascular;  Laterality: N/A;   MASS EXCISION Right 08/27/2014   Procedure: EXCISION MASS RIGHT PALM/INDEX;  Surgeon: Leanora Cover, MD;  Location: [facility];  Service: Orthopedics;  Laterality: Right;   THYROIDECTOMY  [id number]   HPI:  89 year old man who presented [facility] ED [facility]/29 via EMS after unwitnessed fall at work, patient did not have any complaints prior to fall. He was found down between a radiator and the wall, unresponsive with contusion and deformities to R side of his head. Initially with tachycardic with EMS, then bradycardic. ST elevation noted on EKG and Code STEMI was called, later called off as ST elevation had resolved. Patient lost pulses while in ED and became apneic with cardiac monitoring showing PEA arrest. CPR administered x 9 minutes with Epi x 4 and ROSC, patient never had a shockable rhythm. Intubated peri-arrest 2/28-3/3 then reintubated until 3/6.Marland KitchenPMHx significant for HTN, HFrEF with NICM (cMRI 12/2022 with severe LV dilation, EF 17%, LBBB, followed by Dr. Haroldine Laws), AFib (on Eliquis), MAI infection, GERD, hypothyroidism, history of thyroid CA.    Assessment / Plan / Recommendation  Clinical Impression  Pt is actively recovering from prolonged multiple intubations; he is coughing at baseline trying to clear secretions multiple times each minute. When offered ice and water pt ahs  multiple audible swallows and immediate expectoration of thick mucous. A sip of water results in very hard cough. Recommend keeping pt NPO for now, offering ice chips to aid pt in clearing his airway with moisture and reassessing tomorrow. Suspect pt will need instrumental testing given presentation and pt/family report of mild baseline  dysphagia prior to admit. SLP Visit Diagnosis: Dysphagia, oropharyngeal phase (R13.12)    Aspiration Risk  Moderate aspiration risk    Diet Recommendation Alternative means - temporary;Ice chips PRN after oral care        Other  Recommendations Oral Care Recommendations: Oral care QID    Recommendations for follow up therapy are one component of a multi-disciplinary discharge planning process, led by the attending physician.  Recommendations may be updated based on patient status, additional functional criteria and insurance authorization.  Follow up Recommendations Skilled nursing-short term rehab (<3 hours/day)      Assistance Recommended at Discharge    Functional Status Assessment Patient has had a recent decline in their functional status and demonstrates the ability to make significant improvements in function in a reasonable and predictable amount of time.  Frequency and Duration min 2x/week  2 weeks       Prognosis Prognosis for improved oropharyngeal function: Good      Swallow Study   General HPI: 89 year old man who presented to [facility] ED 2/29 via EMS after unwitnessed fall at work, patient did not have any complaints prior to fall. He was found down between a radiator and the wall, unresponsive with contusion and deformities to R side of his head. Initially with tachycardic with EMS, then bradycardic. ST elevation noted on EKG and Code STEMI was called, later called off as ST elevation had resolved. Patient lost pulses while in ED and became apneic with cardiac monitoring showing PEA arrest. CPR administered x 9 minutes with Epi x 4 and ROSC, patient never had a shockable rhythm. Intubated peri-arrest 2/28-3/3 then reintubated until 3/6.Marland KitchenPMHx significant for HTN, HFrEF with NICM (cMRI 12/2022 with severe LV dilation, EF 17%, LBBB, followed by Dr. Haroldine Laws), AFib (on Eliquis), MAI infection, GERD, hypothyroidism, history of thyroid CA. Type of Study: Bedside Swallow  Evaluation Diet Prior to this Study: NPO;Cortrak/Small bore NG tube Temperature Spikes Noted: No History of Recent Intubation: No Behavior/Cognition: Alert;Cooperative;Pleasant mood Self-Feeding Abilities: Total assist Patient Positioning: Upright in bed Baseline Vocal Quality: Hoarse;Low vocal intensity Volitional Cough: Congested;Weak    Oral/Motor/Sensory Function Overall Oral Motor/Sensory Function: Within functional limits   Ice Chips Ice chips: Impaired Presentation: Spoon Pharyngeal Phase Impairments: Cough - Immediate;Multiple swallows   Thin Liquid Thin Liquid: Impaired Presentation: Straw;Spoon Pharyngeal  Phase Impairments: Throat Clearing - Immediate;Cough - Immediate    Nectar Thick Nectar Thick Liquid: Not tested   Honey Thick Honey Thick Liquid: Not tested   Puree Puree: Not tested   Solid     Solid: Not tested      Lynann Beaver 01/20/2023,12:58 PM

## 2023-01-20 NOTE — Progress Notes (Signed)
NAME:  Curtis Ramos, MRN:  [id number], DOB:  Apr 11, 1932, LOS: 7 ADMISSION DATE:  01/13/2023, CONSULTATION DATE:  01/13/2023 REFERRING MD:  Trauma, CHIEF COMPLAINT:  Cardiac arrest, post-fall (Level 1 Trauma)   History of Present Illness:  89 year old man who presented to [facility] ED 2/29 via EMS after unwitnessed fall at work. PMHx significant for HTN, HFrEF with NICM (cMRI 12/2022 with severe LV dilation, EF 17%, LBBB, followed by Dr. Haroldine Laws), AFib (on Eliquis), MAI infection, GERD, hypothyroidism, history of thyroid CA.   History obtained from chart review and from family. Patient presented to [facility] ED via EMS after a fall at work; per family, patient did not have any complaints prior to fall. Recently undergoing workup with Cardiology for pacemaker placement. He was found down between a radiator and the wall, unresponsive with contusion and deformities to R side of his head. Initially with tachycardic with EMS, then bradycardic. ST elevation noted on EKG and Code STEMI was called, later called off as ST elevation had resolved. Patient lost pulses while in ED and became apneic with cardiac monitoring showing PEA arrest. CPR administered x 9 minutes with Epi x 4 and ROSC, patient never had a shockable rhythm. Intubated peri-arrest. Cards was consulted, noting high risk of VT/VF in the setting of severe CM. AHF was also notified of admission.   Labs demonstrated WBC 13.9, Hgb 14.1, Plt 169. INR 2.2 (on Eliquis). Na 138, K 4.1, CO2 18, Cr 1.79 (baseline ~1.3). CK 205. LA 7.0 > 4.6. Trop 27 > 155. BNP 1073. UA with glucose > 500, +protein. PCT < 0.10. CT Head NAICA, R parietal scalp hematoma. CT C-spine negative for acute findings. CT Chest/A/P with nondisplaced transverse fracture of mid-lower sternal body, acute fx of costal cartilage of bilateral 2nd-6th ribs + 7th costal cartilage, acute fx of anterior 2nd-7th ribs; pulmonary lobular interstitial thickening (?pulm edema), BLL opacities, trace ascites.  Empiric Unasyn started for ?aspiration.   PCCM was consulted for ICU admission. Pertinent  Medical History   Past Medical History:  Diagnosis Date   Acute on chronic combined systolic and diastolic CHF (congestive heart failure) ([place]) 03/19/2019   AKI (acute kidney injury) ([place]) 12/26/2014   Arthralgia 11/04/2014   Arthritis    "proabably"   Atypical mycobacterium infection    [facility])    family unaware of this hx on [id number]   Chronic systolic heart failure (HCC)    Decreased oral intake 12/26/2014   Decreased oral intake 12/26/2014   Depressed left ventricular ejection fraction 02/05/2020   Dilated cardiomyopathy ([place]) 03/19/2019   Ejection fraction 20 to 25% based on echocardiogram done by pulmonologist month ago.   DJD (degenerative joint disease) 10/07/2014   Dyspnea on exertion 03/19/2019   Essential hypertension 02/05/2020   GERD (gastroesophageal reflux disease)    Hammer toes of both feet 01/30/2018   History of thyroid cancer 12/16/2016   Hypercalcemia 12/26/2014   Hypothyroidism    Hypothyroidism associated with surgical procedure 10/07/2014   Lung mass    Myalgia 11/04/2014   Mycobacterium avium complex ([place])    Nail dystrophy 01/30/2018   Occult malignancy (HCC)    PAF (paroxysmal atrial fibrillation) ([place]) 02/05/2020   Persistent atrial fibrillation ([place]) 03/19/2019   Chads 2 Vascor equals 3   PONV (postoperative nausea and vomiting)    Pre-ulcerative calluses 01/30/2018   Pseudoaneurysm following procedure ([place]) 04/16/2020   Sensorineural hearing loss (SNHL), bilateral 08/11/2018   Shingles    Toenail fungus 01/30/2018   Wears  glasses    Significant Hospital Events: Including procedures, antibiotic start and stop dates in addition to other pertinent events   2/29 - Presented to [facility] ED after 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 Found between radiator and wall with R-sided head injury. Suspected cardiac etiology of unresponsiveness. On ED arrival,  became pulseless (rhythm PEA). CPR x 9 min, Epi x 4 with ROSC. Intubated peri-arrest. CT Head, C-Spine, Chest/A/P completed (as above). Unasyn for ?aspiration. R axillary A-line, L subclavian CVC, ETT tube exchange + bronch.  3/3 - Extubated, back into respiratory failure, reintubated 3/3 - OG tube placement complications, completely replaced, still trouble with placement 3/4 - Started cefepime for respiratory culture showing abundant pseudomonas  Interim History / Subjective:  No changes overnight and no acute complaints. Patient is comfortable in bed.  Objective   Blood pressure 131/67, pulse 73, temperature 98.5 F (36.9 C), temperature source Oral, resp. rate (!) 25, height '5\' 11"'$  (1.803 m), weight 59.9 kg, SpO2 98 %. CVP:  [1 mmHg-4 mmHg] 4 mmHg  Vent Mode: PSV;CPAP FiO2 (%):  [40 %] 40 % Set Rate:  [20 bmp-32 bmp] 20 bmp Vt Set:  [600 mL] 600 mL PEEP:  [5 cmH20] 5 cmH20 Pressure Support:  [5 cmH20] 5 cmH20 Plateau Pressure:  [20 cmH20] 20 cmH20   Intake/Output Summary (Last 24 hours) at 01/20/2023 0703 Last data filed at 01/20/2023 0528 Gross per 24 hour  Intake 910.52 ml  Output 2235 ml  Net -1324.48 ml    Filed Weights   01/18/23 0500 01/19/23 0500 01/20/23 0421  Weight: 58.7 kg 60 kg 59.9 kg   Examination: Constitutional:Chronically ill-appearing elderly gentleman. In no acute distress. Cardio:Regular rate with irregular rhythm, I suspect PVCs. No murmurs, rubs, or gallops. Pulm:Some rhonchi on [place]. Abdomen:Soft, nontender, non-distended. [id number] for extremity edema.  Skin:Warm and dry. Neuro:Awake and alert, follows commands.  Resolved Hospital Problem list   AKI Acute encephalopathy, multifactorial Lactic acidosis Hypokalemia Elevated liver enzymes Acute hypoxemic respiratory failure Pulmonary edema Leukocytosis Septic shock Cardiogenic shock Assessment & Plan:  VAP Acute on chronic systolic heart failure, LF EV 17% Atelectasis 2/2 sternum and rib  fractures sustained during CPR Severe nonischemic cardiomyopathy Atrial fibrillation Successful extubation 03/06. Remains afebrile, leukocytosis has resolved. Currently on cefepime for pseudomonas VAP. Minimally net positive in terms of fluid status. Advanced HF team following, appreciate their recommendations. Currently holding home amiodarone and GDMT for HF. Plan: -Continue cefepime -ABG if changes in mental status -Advanced HF team following -Trend WBC, fever curve -Continue cardiac monitoring, CVP  Post-PEA arrest Multifactorial shock Epinephrine gradually titrated off after extubation. Co-ox panel with O2 saturation 99.1%.  Plan: -Remain off epinephrine as tolerated -Cautious IVF resuscitation if needed -Trend co-ox -Infectious management as above  Thrombocytopenia Likely reactive. Improving daily, now 135. Plan: -Trend CBC  Hypernatremia Nutrition needs Na 148, overall unchanged. FWF, TF held yesterday for extubation and additional lasix given. Hopeful that he can tolerate swallow test and begin taking PO intake again. Plan: -Tube feeds w/ free water flushes; d/c if able to safely swallow -Consult SLP -SSI, CBG monitoring; goal CBG 140-180 -Trend BMP  Hx hypothyroidism Plan: -Continue home levothyroxine 175 mcg daily per tube -Recommend repeat labs as outpatient once acute illness has resolved  Scalp hematoma Rib fractures Sternum fracture Sustained during CPR.  Plan: -Continue lidocaine patches for chest wall Best Practice (right click and "Reselect all SmartList Selections" daily)   Diet/type: tubefeeds DVT prophylaxis: prophylactic heparin  GI prophylaxis:  PPI Lines: Central line and Arterial Line Foley:  N/A Code Status:  full code Last date of multidisciplinary goals of care discussion [02/29]  Labs   CBC: Recent Labs  Lab 01/16/23 0207 01/16/23 1023 01/17/23 0318 01/18/23 0522 01/19/23 0523 01/19/23 1007 01/20/23 0425  WBC 15.4*  --   13.3* 12.6* 12.7*  --  10.4  HGB 12.2*   < > 12.0* 11.9* 10.2* 9.9* 10.7*  HCT 36.3*   < > 35.3* 34.3* 31.1* 29.0* 32.0*  MCV 108.4*  --  108.6* 105.5* 110.3*  --  110.3*  PLT 143*  --  130* 134* 144*  --  135*   < > = values in this interval not displayed.     Basic Metabolic Panel: Recent Labs  Lab 01/14/23 1638 01/15/23 0400 01/15/23 1637 01/16/23 0207 01/16/23 1023 01/17/23 0318 01/18/23 0522 01/18/23 1247 01/19/23 0523 01/19/23 1007 01/20/23 0425  NA  --  142  --  146*   < > 149* 149* 146* 147* 148* 148*  K  --  3.5  --  4.0   < > 3.6 2.9* 3.9 4.0 4.1 4.1  CL  --  114*  --  115*  --  117* 117* 116* 118*  --  119*  CO2  --  20*  --  23  --  21* '25 23 22  '$ --  26  GLUCOSE  --  116*  --  129*  --  106* 111* 163* 124*  --  91  BUN  --  32*  --  32*  --  32* 41* 43* 32*  --  29*  CREATININE  --  1.44*  --  1.24  --  1.15 1.24 1.25* 1.13  --  1.02  CALCIUM  --  9.1  --  9.7  --  10.1 9.8 9.6 9.0  --  10.1  MG 2.2 2.1 2.1 2.3  --   --   --   --  2.4  --   --   PHOS 3.3 2.3* 2.7 2.4*  --  2.0*  --  1.6* 3.7  --   --    < > = values in this interval not displayed.    GFR: Estimated Creatinine Clearance: 42.4 mL/min (by C-G formula based on SCr of 1.02 mg/dL). Recent Labs  Lab 01/13/23 1344 01/13/23 1635 01/14/23 0333 01/14/23 0821 01/14/23 1320 01/15/23 0400 01/17/23 0318 01/17/23 0945 01/18/23 0522 01/19/23 0523 01/20/23 0425  PROCALCITON <0.10  --   --   --   --   --   --   --   --   --   --   WBC  --   --    < >  --   --    < > 13.3*  --  12.6* 12.7* 10.4  LATICACIDVEN 4.6* 2.9*  --  3.6* 5.0*  --   --  1.7  --   --   --    < > = values in this interval not displayed.     Liver Function Tests: Recent Labs  Lab 01/14/23 0333 01/15/23 0400 01/16/23 0207 01/17/23 0318 01/18/23 0522  AST 191* 44* '29 24 17  '$ ALT 228* 117* 81* 62* 46*  ALKPHOS 111 82 71 71 81  BILITOT 1.2 1.5* 1.5* 1.4* 1.3*  PROT 6.2* 5.7* 5.8* 5.5* 5.8*  ALBUMIN 3.1* 2.6* 2.6* 2.5* 2.4*     No results for input(s): "LIPASE", "AMYLASE" in the last 168 hours. No results  for input(s): "AMMONIA" in the last 168 hours.  ABG    Component Value Date/Time   PHART 7.382 01/19/2023 1007   PCO2ART 40.1 01/19/2023 1007   PO2ART 155 (H) 01/19/2023 1007   HCO3 23.7 01/19/2023 1007   TCO2 25 01/19/2023 1007   ACIDBASEDEF 1.0 01/19/2023 1007   O2SAT 99.1 01/20/2023 0425     Coagulation Profile: Recent Labs  Lab 01/13/23 1223  INR 2.2*     Cardiac Enzymes: Recent Labs  Lab 01/13/23 1209  CKTOTAL 205     HbA1C: Hgb A1c MFr Bld  Date/Time Value Ref Range Status  01/13/2023 12:23 PM 5.9 (H) 4.8 - 5.6 % Final    Comment:    (NOTE)         Prediabetes: 5.7 - 6.4         Diabetes: >6.4         Glycemic control for adults with diabetes: <7.0     CBG: Recent Labs  Lab 01/19/23 1104 01/19/23 1514 01/19/23 1912 01/19/23 2304 01/20/23 0304  GLUCAP 103* 101* 93 74 84     Past Medical History:  He,  has a past medical history of Acute on chronic combined systolic and diastolic CHF (congestive heart failure) ([place]) (03/19/2019), AKI (acute kidney injury) ([place]) (12/26/2014), Arthralgia (11/04/2014), Arthritis, Atypical mycobacterium infection, Cancer ([place]), Chronic systolic heart failure ([place]), Decreased oral intake (12/26/2014), Decreased oral intake (12/26/2014), Depressed left ventricular ejection fraction (02/05/2020), Dilated cardiomyopathy ([place]) (03/19/2019), DJD (degenerative joint disease) (10/07/2014), Dyspnea on exertion (03/19/2019), Essential hypertension (02/05/2020), GERD (gastroesophageal reflux disease), Hammer toes of both feet (01/30/2018), History of thyroid cancer (12/16/2016), Hypercalcemia (12/26/2014), Hypothyroidism, Hypothyroidism associated with surgical procedure (10/07/2014), Lung mass, Myalgia (11/04/2014), Mycobacterium avium complex ([place]), Nail dystrophy (01/30/2018), Occult malignancy ([place]), PAF (paroxysmal atrial fibrillation) ([place]) (02/05/2020), Persistent  atrial fibrillation ([place]) (03/19/2019), PONV (postoperative nausea and vomiting), Pre-ulcerative calluses (01/30/2018), Pseudoaneurysm following procedure ([place]) (04/16/2020), Sensorineural hearing loss (SNHL), bilateral (08/11/2018), Shingles, Toenail fungus (01/30/2018), and Wears glasses.   Surgical History:   Past Surgical History:  Procedure Laterality Date   APPENDECTOMY     CATARACT EXTRACTION W/ INTRAOCULAR LENS  IMPLANT, BILATERAL Bilateral    COLONOSCOPY     INGUINAL HERNIA REPAIR Left    LEFT HEART CATH AND CORONARY ANGIOGRAPHY N/A 03/11/2020   Procedure: LEFT HEART CATH AND CORONARY ANGIOGRAPHY;  Surgeon: Jettie Booze, MD;  Location: [facility];  Service: Cardiovascular;  Laterality: N/A;   MASS EXCISION Right 08/27/2014   Procedure: EXCISION MASS RIGHT PALM/INDEX;  Surgeon: Leanora Cover, MD;  Location: [place];  Service: Orthopedics;  Laterality: Right;   THYROIDECTOMY  2003     Social History:   reports that he has never smoked. He has never used smokeless tobacco. He reports that he does not drink alcohol and does not use drugs.   Family History:  His family history is not on file.   Allergies No Known Allergies   Home Medications  Prior to Admission medications   Medication Sig Start Date End Date Taking? Authorizing Provider  amiodarone (PACERONE) 200 MG tablet Take 200 mg by mouth daily.    [provider]  carvedilol (COREG) 6.25 MG tablet Take 1 tablet (6.25 mg total) by mouth 2 (two) times daily. 12/31/22   Bensimhon, Shaune Pascal, MD  dapagliflozin propanediol (FARXIGA) 10 MG TABS tablet Take 1 tablet (10 mg total) by mouth daily before breakfast. 10/18/22   Park Liter, MD  DOCUSATE SODIUM PO Take 1  tablet by mouth daily.    [provider]  ELIQUIS 5 MG TABS tablet TAKE 1 TABLET BY MOUTH TWICE A DAY 12/03/22   Park Liter, MD  furosemide (LASIX) 20 MG tablet Take 4 tablets (80 mg total) by mouth daily.  12/07/22   Bensimhon, Shaune Pascal, MD  polyethylene glycol (MIRALAX / GLYCOLAX) 17 g packet Take 17 g by mouth daily.    [provider]  sacubitril-valsartan (ENTRESTO) 49-51 MG Take 1 tablet by mouth 2 (two) times daily. 11/18/22   Park Liter, MD  SYNTHROID 175 MCG tablet Take 175 mcg by mouth daily. 05/16/22   [provider]         Farrel Gordon, [facility] PGY-2 [phone]

## 2023-01-20 NOTE — Progress Notes (Addendum)
eLink Physician-Brief Progress Note Patient Name: JIAN SANTALUCIA DOB: 1932/10/19 MRN: [id number]   Date of Service  01/20/2023  HPI/Events of Note  89 year old male status post PEA arrest complicated by hypoxic respiratory failure complicated by pulmonary edema who was extubated and remains extremely weak.  He has been intubated extubated twice and has difficulty expectorating secretions.  Called because the patient has been appearing more and more fatigued with progressive tachypnea and worsening ectopy.  eICU Interventions  With bedside team, placed the patient on BiPAP, tachypnea somewhat improved on 16/8, 80%.  Anticipate ABG in a short while along with CMP mag and Phos.  Patient repositioned with improved aeration and volumes.  Add glycopyrrolate for secretions.    [id number] - -> Maintain bipap for increased WOB. Ideally give fluids for hypercalcemia in the setting of weakness. Start LR '@75'$  for 10hr. Other electrolytes appropriate.  0447 - Phosphorus 2, K+ 4, Na+ 150; naPhos ordered   Intervention Category Major Interventions: Hypoxemia - evaluation and management  Lezly Rumpf 01/20/2023, 10:27 PM

## 2023-01-20 NOTE — Progress Notes (Signed)
Pharmacy Antibiotic Note  Curtis Ramos is a 89 y.o. male admitted on 01/13/2023 as a level 1 Trauma post fall and s/p PEA arrest who has been on Ceftriaxone for suspected pneumonia now with new GNR on respiratory culture concerning for ventilator associated pneumonia.  Pharmacy has been consulted for broadening to Cefepime dosing.   Patient extubated 3/6 on 2L [place]. Pt afebrile, WBC 10.4, Scr down to 1.02.  Plan: Continue Cefepime 2g IV every 12 hours. D/w team - stop date entered for 7 days total.  Monitor renal function and clinical status. Pharmacy will sign off.    Height: '5\' 11"'$  (180.3 cm) Weight: 59.9 kg (132 lb 0.9 oz) IBW/kg (Calculated) : 75.3  Temp (24hrs), Avg:98.3 F (36.8 C), Min:97.9 F (36.6 C), Max:98.6 F (37 C)  Recent Labs  Lab 01/13/23 1344 01/13/23 1635 01/14/23 0333 01/14/23 0821 01/14/23 1320 01/15/23 0400 01/16/23 0207 01/17/23 0318 01/17/23 0945 01/18/23 0522 01/18/23 1247 01/19/23 0523 01/20/23 0425  WBC  --   --    < >  --   --    < > 15.4* 13.3*  --  12.6*  --  12.7* 10.4  CREATININE  --   --    < >  --   --    < > 1.24 1.15  --  1.24 1.25* 1.13 1.02  LATICACIDVEN 4.6* 2.9*  --  3.6* 5.0*  --   --   --  1.7  --   --   --   --    < > = values in this interval not displayed.     Estimated Creatinine Clearance: 42.4 mL/min (by C-G formula based on SCr of 1.02 mg/dL).    No Known Allergies  Antimicrobials this admission: Unasyn 2/29 >> 3/3 Ceftriaxone 3/3 >> 3/4 Cefepime 3/4 >> (3/10)   Microbiology results: 2/29 Sputum: negative 3/3 Sputum: abundant pseudomonas aeruginosa (S-cefepime), few yeast with pseudohyphae  2/29 MRSA PCR: negative  Eliseo Gum, PharmD PGY1 Pharmacy Resident   01/20/2023  8:57 AM

## 2023-01-20 NOTE — Progress Notes (Signed)
Nutrition Follow-up  DOCUMENTATION CODES:   Severe malnutrition in context of chronic illness  INTERVENTION:  - Monitor for diet advancement.   - If unable to advance diet, restart tube feeds via Cortrak.  - Osmolite 1.5 at 55 ml/h (1320 ml per day) - Prosource TF20 60 ml 1x/d - Provides 2060 kcal, 103 gm protein, 1006 ml free water daily  NUTRITION DIAGNOSIS:   Severe Malnutrition related to chronic illness (CHF) as evidenced by severe fat depletion, severe muscle depletion.  GOAL:   Patient will meet greater than or equal to 90% of their needs - Not met, ongoing.   MONITOR:   Labs, Vent status, TF tolerance  REASON FOR ASSESSMENT:   Ventilator, Consult Enteral/tube feeding initiation and management  ASSESSMENT:   Pt with hx of CHF, HTN, GERD, and PAF presented to ED after being found down with a contusion and deformities to the right side of the head. Suffered PEA arrest in ED and CPR x 9 minutes.  Meds reviewed: colace, sliding scale insulin, miralax. Labs reviewed:  Na high, BUN elevated.   Pt has been extubated. Cortrak remains in place. SLP consult has been placed. TF have been currently stopped. Pt awaiting eval for diet advancement. RD will continue to monitor for diet advancement.   Diet Order:   Diet Order             Diet NPO time specified  Diet effective now                   EDUCATION NEEDS:   Not appropriate for education at this time  Skin:  Skin Assessment: Reviewed RN Assessment  Last BM:  3/3  Height:   Ht Readings from Last 1 Encounters:  01/18/23 '5\' 11"'$  (1.803 m)    Weight:   Wt Readings from Last 1 Encounters:  01/20/23 59.9 kg    Ideal Body Weight:  78.2 kg  BMI:  Body mass index is 18.42 kg/m.  Estimated Nutritional Needs:   Kcal:  1900-2100 kcal/d  Protein:  95-105g/d  Fluid:  1.8-2L/d  Thalia Bloodgood, RD, LDN, CNSC.

## 2023-01-20 NOTE — Progress Notes (Signed)
ANTICOAGULATION CONSULT NOTE - Initial Consult  Pharmacy Consult for IV heparin Indication: atrial fibrillation  No Known Allergies  Patient Measurements: Height: '5\' 11"'$  (180.3 cm) Weight: 59.9 kg (132 lb 0.9 oz) IBW/kg (Calculated) : 75.3 Heparin Dosing Weight: 58.7 kg (TBW)   Vital Signs: Temp: 98.6 F (37 C) (03/07 0715) Temp Source: Axillary (03/07 0715) BP: 131/67 (03/07 0400) Pulse Rate: 76 (03/07 0700)  Labs: Recent Labs    01/18/23 0522 01/18/23 1247 01/18/23 1843 01/19/23 0523 01/19/23 1007 01/19/23 1453 01/19/23 2347 01/20/23 0425  HGB 11.9*  --   --  10.2* 9.9*  --   --  10.7*  HCT 34.3*  --   --  31.1* 29.0*  --   --  32.0*  PLT 134*  --   --  144*  --   --   --  135*  APTT  --   --    < > 55*  --  59* 74* 74*  HEPARINUNFRC  --   --    < > >1.10*  --   --  >1.10* >1.10*  CREATININE 1.24 1.25*  --  1.13  --   --   --  1.02   < > = values in this interval not displayed.     Estimated Creatinine Clearance: 42.4 mL/min (by C-G formula based on SCr of 1.02 mg/dL).   Medical History: Past Medical History:  Diagnosis Date   Acute on chronic combined systolic and diastolic CHF (congestive heart failure) ([place]) 03/19/2019   AKI (acute kidney injury) ([place]) 12/26/2014   Arthralgia 11/04/2014   Arthritis    "proabably"   Atypical mycobacterium infection    Cancer [facility])    family unaware of this hx on [id number]   Chronic systolic heart failure (HCC)    Decreased oral intake 12/26/2014   Decreased oral intake 12/26/2014   Depressed left ventricular ejection fraction 02/05/2020   Dilated cardiomyopathy ([place]) 03/19/2019   Ejection fraction 20 to 25% based on echocardiogram done by pulmonologist month ago.   DJD (degenerative joint disease) 10/07/2014   Dyspnea on exertion 03/19/2019   Essential hypertension 02/05/2020   GERD (gastroesophageal reflux disease)    Hammer toes of both feet 01/30/2018   History of thyroid cancer 12/16/2016   Hypercalcemia 12/26/2014    Hypothyroidism    Hypothyroidism associated with surgical procedure 10/07/2014   Lung mass    Myalgia 11/04/2014   Mycobacterium avium complex ([place])    Nail dystrophy 01/30/2018   Occult malignancy (HCC)    PAF (paroxysmal atrial fibrillation) ([place]) 02/05/2020   Persistent atrial fibrillation ([place]) 03/19/2019   Chads 2 Vascor equals 3   PONV (postoperative nausea and vomiting)    Pre-ulcerative calluses 01/30/2018   Pseudoaneurysm following procedure ([place]) 04/16/2020   Sensorineural hearing loss (SNHL), bilateral 08/11/2018   Shingles    Toenail fungus 01/30/2018   Wears glasses     Assessment: 14 YOM with hx afib, on Eliquis PTA, last dose 2/29 PTA. On subcutaneous heparin for VTE prophylaxis. Pt with R-scalp hematoma on 2/29, CT head negative for intracranial hemorrhage.Pharmacy consulted to start IV heparin while Eliquis is on hold. Discussed with HF team, will start heparin with no bolus and slowly titrate to lower heparin level/aPTT goal.   Confirmatory aPTT 74 therapeutic and stable. HL >1.1 due to recent Eliquis, not yet correlating with aPTT. Hgb 10.7 stable, PLTs 135 stable. No bleeding noted.   Goal of Therapy:  Heparin level 0.3-0.5 units/ml aPTT 66-85 seconds Monitor  platelets by anticoagulation protocol: Yes   Plan:  Continue heparin at 1,100 units/hr Daily aPTT and HL until correlates Monitor s/sx bleeding F/u transition back to Eliquis   Eliseo Gum, PharmD PGY1 Pharmacy Resident   01/20/2023  9:01 AM

## 2023-01-20 NOTE — Progress Notes (Signed)
Patient appears very fatigued and has rhonchi throughout all lobes. Dr. Loanne Drilling was notified of this in light of transfer orders. Will hold in ICU tonight.

## 2023-01-21 DIAGNOSIS — A419 Sepsis, unspecified organism: Secondary | ICD-10-CM | POA: Diagnosis not present

## 2023-01-21 DIAGNOSIS — Z515 Encounter for palliative care: Secondary | ICD-10-CM | POA: Diagnosis not present

## 2023-01-21 DIAGNOSIS — J9601 Acute respiratory failure with hypoxia: Secondary | ICD-10-CM | POA: Diagnosis not present

## 2023-01-21 DIAGNOSIS — E039 Hypothyroidism, unspecified: Secondary | ICD-10-CM

## 2023-01-21 DIAGNOSIS — E43 Unspecified severe protein-calorie malnutrition: Secondary | ICD-10-CM | POA: Diagnosis not present

## 2023-01-21 DIAGNOSIS — I469 Cardiac arrest, cause unspecified: Secondary | ICD-10-CM | POA: Diagnosis not present

## 2023-01-21 LAB — CBC
HCT: 35 % — ABNORMAL LOW (ref 39.0–52.0)
HCT: 32.3 % — ABNORMAL LOW (ref 39.0–52.0)
Hemoglobin: 11.4 g/dL — ABNORMAL LOW (ref 13.0–17.0)
Hemoglobin: 10.2 g/dL — ABNORMAL LOW (ref 13.0–17.0)
MCH: 36.2 pg — ABNORMAL HIGH (ref 26.0–34.0)
MCH: 35.8 pg — ABNORMAL HIGH (ref 26.0–34.0)
MCHC: 32.6 g/dL (ref 30.0–36.0)
MCHC: 31.6 g/dL (ref 30.0–36.0)
MCV: 111.1 fL — ABNORMAL HIGH (ref 80.0–100.0)
MCV: 113.3 fL — ABNORMAL HIGH (ref 80.0–100.0)
Platelets: 203 10*3/uL (ref 150–400)
Platelets: 190 10*3/uL (ref 150–400)
RBC: 3.15 MIL/uL — ABNORMAL LOW (ref 4.22–5.81)
RBC: 2.85 MIL/uL — ABNORMAL LOW (ref 4.22–5.81)
RDW: 15 % (ref 11.5–15.5)
RDW: 14.8 % (ref 11.5–15.5)
WBC: 17.1 10*3/uL — ABNORMAL HIGH (ref 4.0–10.5)
WBC: 14.1 10*3/uL — ABNORMAL HIGH (ref 4.0–10.5)
nRBC: 0 % (ref 0.0–0.2)
nRBC: 0 % (ref 0.0–0.2)

## 2023-01-21 LAB — BASIC METABOLIC PANEL
Anion gap: 10 (ref 5–15)
BUN: 38 mg/dL — ABNORMAL HIGH (ref 8–23)
CO2: 22 mmol/L (ref 22–32)
Calcium: 10.7 mg/dL — ABNORMAL HIGH (ref 8.9–10.3)
Chloride: 118 mmol/L — ABNORMAL HIGH (ref 98–111)
Creatinine, Ser: 0.99 mg/dL (ref 0.61–1.24)
GFR, Estimated: >60 mL/min (ref >60)
Glucose, Bld: 133 mg/dL — ABNORMAL HIGH (ref 70–99)
Potassium: 4 mmol/L (ref 3.5–5.1)
Sodium: 150 mmol/L — ABNORMAL HIGH (ref 135–145)

## 2023-01-21 LAB — COOXEMETRY PANEL
Carboxyhemoglobin: 1.5 % (ref 0.5–1.5)
Methemoglobin: <0.7 % (ref 0.0–1.5)
O2 Saturation: 70.6 %
Total hemoglobin: 11.7 g/dL — ABNORMAL LOW (ref 12.0–16.0)

## 2023-01-21 LAB — GLUCOSE, CAPILLARY
Glucose-Capillary: 121 mg/dL — ABNORMAL HIGH (ref 70–99)
Glucose-Capillary: 112 mg/dL — ABNORMAL HIGH (ref 70–99)
Glucose-Capillary: 128 mg/dL — ABNORMAL HIGH (ref 70–99)
Glucose-Capillary: 143 mg/dL — ABNORMAL HIGH (ref 70–99)
Glucose-Capillary: 115 mg/dL — ABNORMAL HIGH (ref 70–99)
Glucose-Capillary: 126 mg/dL — ABNORMAL HIGH (ref 70–99)

## 2023-01-21 LAB — APTT: aPTT: 64 seconds — ABNORMAL HIGH (ref 24–36)

## 2023-01-21 LAB — HEPARIN LEVEL (UNFRACTIONATED): Heparin Unfractionated: 1.08 IU/mL — ABNORMAL HIGH (ref 0.30–0.70)

## 2023-01-21 LAB — HEMOGLOBIN AND HEMATOCRIT, BLOOD
HCT: 33.6 % — ABNORMAL LOW (ref 39.0–52.0)
Hemoglobin: 11.4 g/dL — ABNORMAL LOW (ref 13.0–17.0)

## 2023-01-21 LAB — PHOSPHORUS: Phosphorus: 2 mg/dL — ABNORMAL LOW (ref 2.5–4.6)

## 2023-01-21 MED ORDER — SODIUM PHOSPHATES 45 MMOLE/15ML IV SOLN
30.0000 mmol | Freq: Once | INTRAVENOUS | Status: AC
  Administered 2023-01-21: 30 mmol via INTRAVENOUS
  Filled 2023-01-21: qty 10

## 2023-01-21 MED ORDER — DOCUSATE SODIUM 50 MG/5ML PO LIQD: 100.0000 mg | Freq: Two times a day (BID) | ORAL | Status: DC | PRN

## 2023-01-21 MED ORDER — FUROSEMIDE 10 MG/ML IJ SOLN
40.0000 mg | Freq: Once | INTRAMUSCULAR | Status: AC
  Administered 2023-01-21: 40 mg via INTRAVENOUS
  Filled 2023-01-21: qty 4

## 2023-01-21 MED ORDER — FREE WATER
200.0000 mL | Status: DC
  Administered 2023-01-21 – 2023-02-02 (×65): 200 mL

## 2023-01-21 MED ORDER — OXIDIZED CELLULOSE EX PADS
1.0000 | MEDICATED_PAD | Freq: Once | CUTANEOUS | Status: AC
  Administered 2023-01-21: 1 via TOPICAL
  Filled 2023-01-21: qty 1

## 2023-01-21 MED ORDER — POLYETHYLENE GLYCOL 3350 17 G PO PACK: 17.0000 g | PACK | Freq: Every day | ORAL | Status: DC | PRN

## 2023-01-21 MED ORDER — AMIODARONE HCL 200 MG PO TABS
200.0000 mg | ORAL_TABLET | Freq: Every day | ORAL | Status: DC
  Administered 2023-01-21 – 2023-01-31 (×11): 200 mg
  Filled 2023-01-21 (×11): qty 1

## 2023-01-21 MED ORDER — LACTATED RINGERS IV SOLN: INTRAVENOUS | Status: DC

## 2023-01-21 MED ORDER — PANTOPRAZOLE SODIUM 40 MG IV SOLR
40.0000 mg | INTRAVENOUS | Status: DC
  Administered 2023-01-21 – 2023-01-23 (×3): 40 mg via INTRAVENOUS
  Filled 2023-01-21 (×3): qty 10

## 2023-01-21 NOTE — Progress Notes (Signed)
PT Cancellation Note  Patient Details Name: Curtis Ramos MRN: [id number] DOB: [birth year]   Cancelled Treatment:    Reason Eval/Treat Not Completed: Patient not medically ready per nursing. Required bipap overnight. Will follow for readiness for PT.   Shary Decamp [facility] 01/21/2023, 9:29 AM [place] Office [phone]

## 2023-01-21 NOTE — Consult Note (Signed)
[facility] Consultation Note  Referring Provider: [facility] Primary Care Physician:  Leonides Sake, MD Primary Gastroenterologist:  Dr. Michail Sermon  Reason for Consultation:  hematochezia  HPI: Curtis Ramos is a 89 y.o. male cardiomyopathy EF 17% admitted after PEA arrest.  Today has had some hematochezia.  No melena or hematemesis.  He had colonoscopy 2016 with Dr. Michail Sermon showing left-sided diverticulosis, small sub-cm polyp sigmoid colon (adenomatous) and left-sided diverticulosis.  Patient can provide no further history.  On Eliquis upon admission, now on heparin IV (on hold).   Past Medical History:  Diagnosis Date   Acute on chronic combined systolic and diastolic CHF (congestive heart failure) ([place]) 03/19/2019   AKI (acute kidney injury) ([place]) 12/26/2014   Arthralgia 11/04/2014   Arthritis    "proabably"   Atypical mycobacterium infection    Cancer [facility])    family unaware of this hx on [id number]   Chronic systolic heart failure (HCC)    Decreased oral intake 12/26/2014   Decreased oral intake 12/26/2014   Depressed left ventricular ejection fraction 02/05/2020   Dilated cardiomyopathy ([place]) 03/19/2019   Ejection fraction 20 to 25% based on echocardiogram done by pulmonologist month ago.   DJD (degenerative joint disease) 10/07/2014   Dyspnea on exertion 03/19/2019   Essential hypertension 02/05/2020   GERD (gastroesophageal reflux disease)    Hammer toes of both feet 01/30/2018   History of thyroid cancer 12/16/2016   Hypercalcemia 12/26/2014   Hypothyroidism    Hypothyroidism associated with surgical procedure 10/07/2014   Lung mass    Myalgia 11/04/2014   Mycobacterium avium complex ([place])    Nail dystrophy 01/30/2018   Occult malignancy (HCC)    PAF (paroxysmal atrial fibrillation) ([place]) 02/05/2020   Persistent atrial fibrillation ([place]) 03/19/2019   Chads 2 Vascor equals 3   PONV (postoperative nausea and vomiting)    Pre-ulcerative calluses 01/30/2018    Pseudoaneurysm following procedure ([place]) 04/16/2020   Sensorineural hearing loss (SNHL), bilateral 08/11/2018   Shingles    Toenail fungus 01/30/2018   Wears glasses     Past Surgical History:  Procedure Laterality Date   APPENDECTOMY     CATARACT EXTRACTION W/ INTRAOCULAR LENS  IMPLANT, BILATERAL Bilateral    COLONOSCOPY     INGUINAL HERNIA REPAIR Left    LEFT HEART CATH AND CORONARY ANGIOGRAPHY N/A 03/11/2020   Procedure: LEFT HEART CATH AND CORONARY ANGIOGRAPHY;  Surgeon: Jettie Booze, MD;  Location: [place] [facility];  Service: Cardiovascular;  Laterality: N/A;   MASS EXCISION Right 08/27/2014   Procedure: EXCISION MASS RIGHT PALM/INDEX;  Surgeon: Leanora Cover, MD;  Location: [place];  Service: Orthopedics;  Laterality: Right;   THYROIDECTOMY  2003    Prior to Admission medications   Medication Sig Start Date End Date Taking? Authorizing Provider  amiodarone (PACERONE) 200 MG tablet Take 200 mg by mouth daily.   Yes [provider]  carvedilol (COREG) 6.25 MG tablet Take 1 tablet (6.25 mg total) by mouth 2 (two) times daily. 12/31/22  Yes Bensimhon, Shaune Pascal, MD  dapagliflozin propanediol (FARXIGA) 10 MG TABS tablet Take 1 tablet (10 mg total) by mouth daily before breakfast. 10/18/22  Yes Park Liter, MD  DOCUSATE SODIUM PO Take 1 tablet by mouth daily.   Yes [provider]  ELIQUIS 5 MG TABS tablet TAKE 1 TABLET BY MOUTH TWICE A DAY Patient taking differently: Take 5 mg by mouth 2 (two) times daily. 12/03/22  Yes Park Liter, MD  furosemide (LASIX) 20 MG tablet Take 4 tablets (80 mg total) by mouth daily. 12/07/22  Yes Bensimhon, Shaune Pascal, MD  Multiple Vitamins-Minerals (PRESERVISION AREDS PO) Take 1 tablet by mouth daily.   Yes [provider]  polyethylene glycol (MIRALAX / GLYCOLAX) 17 g packet Take 17 g by mouth daily.   Yes [provider]  sacubitril-valsartan (ENTRESTO) 49-51 MG Take 1 tablet by mouth 2  (two) times daily. 11/18/22  Yes Park Liter, MD  SYNTHROID 175 MCG tablet Take 175 mcg by mouth daily. 05/16/22  Yes [provider]    Current Facility-Administered Medications  Medication Dose Route Frequency Provider Last Rate Last Admin   0.9 %  sodium chloride infusion  250 mL Intravenous Continuous Jacky Kindle, MD 10 mL/hr at 01/21/23 1400 Infusion Verify at 01/21/23 1400   amiodarone (PACERONE) tablet 200 mg  200 mg Per Tube Daily Hosie Poisson, MD   200 mg at 01/21/23 1446   ceFEPIme (MAXIPIME) 2 g in sodium chloride 0.9 % 100 mL IVPB  2 g Intravenous Q12H Margaretha Seeds, MD   Stopped at 01/21/23 1221   Chlorhexidine Gluconate Cloth 2 % PADS 6 each  6 each Topical Daily Jacky Kindle, MD   6 each at 01/21/23 1041   docusate (COLACE) 50 MG/5ML liquid 100 mg  100 mg Per Tube BID PRN Hosie Poisson, MD       feeding supplement (OSMOLITE 1.5 CAL) liquid 1,000 mL  1,000 mL Per Tube Continuous Farrel Gordon, DO 55 mL/hr at 01/21/23 1148 1,000 mL at 01/21/23 1148   feeding supplement (PROSource TF20) liquid 60 mL  60 mL Per Tube Daily Farrel Gordon, DO   60 mL at 01/21/23 0923   free water 200 mL  200 mL Per Tube Q4H Sabharwal, Aditya, DO   200 mL at 01/21/23 1152   glycopyrrolate (ROBINUL) injection 0.2 mg  0.2 mg Intravenous TID Paliwal, Aditya, MD   0.2 mg at 01/21/23 0920   heparin ADULT infusion 100 units/mL (25000 units/298m)  1,100 Units/hr Intravenous Continuous AFranky Macho RScrantonat 01/21/23 1129   HYDROmorphone (DILAUDID) injection 0.5 mg  0.5 mg Intravenous Q3H PRN SCandee Furbish MD   0.5 mg at 01/21/23 1032   insulin aspart (novoLOG) injection 0-9 Units  0-9 Units Subcutaneous Q4H RNevada CraneM, PA-C   1 Units at 01/21/23 1151   levothyroxine (SYNTHROID) tablet 175 mcg  175 mcg Per Tube Daily RNevada CraneM, PA-C   175 mcg at 01/21/23 0529   lidocaine (LIDODERM) 5 % 1 patch  1 patch Transdermal Q24H EMargaretha Seeds MD   1 patch at 01/21/23 0945    lip balm (BLISTEX) ointment   Topical PRN EMargaretha Seeds MD       ondansetron ([facility] injection 4 mg  4 mg Intravenous Q6H PRN DFarrel Gordon DO   4 mg at 01/19/23 1147   Oral care mouth rinse  15 mL Mouth Rinse 4 times per day EMargaretha Seeds MD   15 mL at 01/21/23 1200   Oral care mouth rinse  15 mL Mouth Rinse PRN EMargaretha Seeds MD       pantoprazole (PROTONIX) injection 40 mg  40 mg Intravenous Q24H AHosie Poisson MD   40 mg at 01/21/23 1446   polyethylene glycol (MIRALAX / GLYCOLAX) packet 17 g  17 g Per Tube Daily PRN AHosie Poisson MD        Allergies as of 01/13/2023   (  No Known Allergies)    History reviewed. No pertinent family history.  Social History   Socioeconomic History   Marital status: Married    Spouse name: Not on file   Number of children: Not on file   Years of education: Not on file   Highest education level: Not on file  Occupational History   Not on file  Tobacco Use   Smoking status: Never   Smokeless tobacco: Never  Substance and Sexual Activity   Alcohol use: No   Drug use: No   Sexual activity: Not on file  Other Topics Concern   Not on file  Social History Narrative   Not on file   Social Determinants of Health   Financial Resource Strain: Low Risk  (01/18/2023)   Overall Financial Resource Strain (CARDIA)    Difficulty of Paying Living Expenses: Not hard at all  Food Insecurity: No Food Insecurity (01/18/2023)   Hunger Vital Sign    Worried About Running Out of Food in the Last Year: Never true    Ran Out of Food in the Last Year: Never true  Transportation Needs: No Transportation Needs (01/18/2023)   PRAPARE - Hydrologist (Medical): No    Lack of Transportation (Non-Medical): No  Physical Activity: Not on file  Stress: Not on file  Social Connections: Not on file  Intimate Partner Violence: Not on file    Review of Systems: Unable to obtain due to altered mental status.  Physical Exam: Vital  signs in last 24 hours: Temp:  [97.7 F (36.5 C)-98.7 F (37.1 C)] 98.5 F (36.9 C) (03/08 1117) Pulse Rate:  [69-109] 76 (03/08 1336) Resp:  [19-38] 33 (03/08 1336) BP: (115-135)/(48-63) 135/63 (03/08 1336) SpO2:  [90 %-100 %] 100 % (03/08 1336) Arterial Line BP: (128-156)/(58-85) 133/62 (03/08 1200) FiO2 (%):  [40 %-80 %] 40 % (03/08 1336) Weight:  [60.9 kg] 60.9 kg (03/08 0500) Last BM Date : 01/20/23 General:   Obtunded, thin, cachectic Head:  Normocephalic and atraumatic. Eyes:  Sclera clear, no icterus.   Conjunctiva pink. Ears:  Normal auditory acuity. Nose:  No deformity, discharge,  or lesions. Mouth:  No deformity or lesions.  Oropharynx pink & moist. Neck:  Supple; no masses or thyromegaly. Lungs:  No respiratory distress Heart:  Regular Abdomen:  Soft, nontender and nondistended. No masses, hepatosplenomegaly or hernias noted. Normal bowel sounds, without guarding, and without rebound.     Msk: Diffuse atrophy but symmetrical without gross deformities. Normal posture. Pulses:  Normal pulses noted. Extremities:  Without clubbing or edema. Neurologic:  Obtunded, not responsive to questions Skin:  Intact without significant lesions or rashes. Psych:  Obtunded, not responsive to questoins   Lab Results: Recent Labs    01/20/23 1848 01/20/23 2329 01/21/23 0309 01/21/23 1243  WBC 12.7*  --  17.1* 14.1*  HGB 11.0* 11.2* 11.4* 10.2*  HCT 32.8* 33.0* 35.0* 32.3*  PLT 165  --  203 190   BMET Recent Labs    01/20/23 0425 01/20/23 2214 01/20/23 2329 01/21/23 0309  NA 148* 147* 151* 150*  K 4.1 3.9 4.0 4.0  CL 119* 117*  --  118*  CO2 26 22  --  22  GLUCOSE 91 146*  --  133*  BUN 29* 38*  --  38*  CREATININE 1.02 0.98  --  0.99  CALCIUM 10.1 11.0*  --  10.7*   LFT Recent Labs    01/20/23 2214  PROT 7.1  ALBUMIN 2.6*  AST 37  ALT 47*  ALKPHOS 110  BILITOT 1.6*   PT/INR No results for input(s): "LABPROT", "INR" in the last 72  hours.  Studies/Results: No results found.  Impression:   PEA arrest with cardiogenic shock. Non-ischemic cardiomyopathy, EF 17% Hematochezia.  Suspect hemorrhoids versus low-grade diverticular.  Patient has had some bleeding from his dressings upon my conversation with nursing, so possible patient has generalized oozing in setting of heparin administration.  Plan:  Observation and supportive care.  Patient is in absolutely no shape for colonoscopy or any other invasive GI tract evaluation. If GI bleeding becomes a more prominent part of his overall set of medical problems, one could consider further evaluation with CT angiography versus tagged RBC study but, regardless of findings, patient likely not a candidate for any type of intervention in absence of imminently life-threatening hemorrhage. [facility] will sign-off; please call with questions; thank you for the consultation.   LOS: 8 days   Taima Rada M  01/21/2023, 2:51 PM  Cell [phone] If no answer or after 5 PM call [phone]

## 2023-01-21 NOTE — Progress Notes (Signed)
ANTICOAGULATION CONSULT NOTE - Initial Consult  Pharmacy Consult for IV heparin Indication: atrial fibrillation  No Known Allergies  Patient Measurements: Height: '5\' 11"'$  (180.3 cm) Weight: 60.9 kg (134 lb 4.2 oz) IBW/kg (Calculated) : 75.3 Heparin Dosing Weight: 58.7 kg (TBW)   Vital Signs: Temp: 98.6 F (37 C) (03/08 1514) Temp Source: Oral (03/08 1514) BP: 115/59 (03/08 1600) Pulse Rate: 82 (03/08 1800)  Labs: Recent Labs    01/19/23 2347 01/20/23 0425 01/20/23 1848 01/20/23 2214 01/20/23 2329 01/21/23 0309 01/21/23 1243  HGB  --  10.7* 11.0*  --  11.2* 11.4* 10.2*  HCT  --  32.0* 32.8*  --  33.0* 35.0* 32.3*  PLT  --  135* 165  --   --  203 190  APTT 74* 74* 61*  --   --  64*  --   HEPARINUNFRC >1.10* >1.10*  --   --   --  1.08*  --   CREATININE  --  1.02  --  0.98  --  0.99  --      Estimated Creatinine Clearance: 44.4 mL/min (by C-G formula based on SCr of 0.99 mg/dL).   Medical History: Past Medical History:  Diagnosis Date   Acute on chronic combined systolic and diastolic CHF (congestive heart failure) ([place]) 03/19/2019   AKI (acute kidney injury) ([place]) 12/26/2014   Arthralgia 11/04/2014   Arthritis    "proabably"   Atypical mycobacterium infection    Cancer [facility])    family unaware of this hx on [id number]   Chronic systolic heart failure (HCC)    Decreased oral intake 12/26/2014   Decreased oral intake 12/26/2014   Depressed left ventricular ejection fraction 02/05/2020   Dilated cardiomyopathy ([place]) 03/19/2019   Ejection fraction 20 to 25% based on echocardiogram done by pulmonologist month ago.   DJD (degenerative joint disease) 10/07/2014   Dyspnea on exertion 03/19/2019   Essential hypertension 02/05/2020   GERD (gastroesophageal reflux disease)    Hammer toes of both feet 01/30/2018   History of thyroid cancer 12/16/2016   Hypercalcemia 12/26/2014   Hypothyroidism    Hypothyroidism associated with surgical procedure 10/07/2014   Lung mass    Myalgia  11/04/2014   Mycobacterium avium complex ([place])    Nail dystrophy 01/30/2018   Occult malignancy (HCC)    PAF (paroxysmal atrial fibrillation) ([place]) 02/05/2020   Persistent atrial fibrillation ([place]) 03/19/2019   Chads 2 Vascor equals 3   PONV (postoperative nausea and vomiting)    Pre-ulcerative calluses 01/30/2018   Pseudoaneurysm following procedure ([place]) 04/16/2020   Sensorineural hearing loss (SNHL), bilateral 08/11/2018   Shingles    Toenail fungus 01/30/2018   Wears glasses     Assessment: 65 YOM with hx afib, on Eliquis PTA, last dose 2/29 PTA. On subcutaneous heparin for VTE prophylaxis. Pt with R-scalp hematoma on 2/29, CT head negative for intracranial hemorrhage.Pharmacy consulted to start IV heparin while Eliquis is on hold. Discussed with HF team, will start heparin with no bolus and slowly titrate to lower heparin level/aPTT goal.   Heparin held today for hematochezia. Per GI, continue to monitor given poor candidate for procedure. Ok to restart heparin per MD. Last aPTT was 64, just subtherapeutic on 1100 units/hr. Will increase slightly, MD aware.   Goal of Therapy:  Heparin level 0.3-0.5 units/ml aPTT 66-85 seconds Monitor platelets by anticoagulation protocol: Yes   Plan:  Restart heparin at 1,150 units/hr Daily aPTT and HL until correlates Monitor daily aPTT, heparin level, CBC Monitor for  signs/symptoms of bleeding  F/u transition back to [place], PharmD, BCPS, [facility] Clinical Pharmacist  Please check AMION for all [place] phone numbers After 10:00 PM, call [place]

## 2023-01-21 NOTE — Progress Notes (Signed)
OT Cancellation Note  Patient Details Name: Curtis Ramos MRN: [id number] DOB: [birth year]   Cancelled Treatment:    Reason Eval/Treat Not Completed: Medical issues which prohibited therapy. Nursing asked to hold therapy for today. Will return as schedule allows.  Glenford Peers 01/21/2023, 8:30 AM

## 2023-01-21 NOTE — Progress Notes (Signed)
Advanced Heart Failure Rounding Note  PCP-Cardiologist: None   Subjective:   2/29 Admitted PEA. CPR ~ 9 min 4 rounds of epi. Intubated and placed on pressors.  3/3 Extubated. Decompensated. Reintubated. Started on dopamine for bradycardia. Palliative Care consulted. -->Full code.  3/4 Diuresed with IV lasix. Negative 2.8 liters.  3/5 Switched to Epi 3/6 extubated.   Required BiPAP overnight. Now back on HHFNC. WBC 12>>17K. Remains on cefepime.   Off NE w/ stable MAPs in the 80s.   Co-ox 71%. CVP 11.   Intermittently confused.    Objective:   Weight Range: 60.9 kg Body mass index is 18.73 kg/m.   Vital Signs:   Temp:  [97.7 F (36.5 C)-98.7 F (37.1 C)] 98.3 F (36.8 C) (03/08 0841) Pulse Rate:  [69-100] 87 (03/08 0800) Resp:  [19-40] 30 (03/08 0800) BP: (115-127)/(48-58) 127/58 (03/08 0720) SpO2:  [90 %-100 %] 100 % (03/08 0800) Arterial Line BP: (130-156)/(58-85) 139/70 (03/08 0800) FiO2 (%):  [40 %-80 %] 50 % (03/08 0720) Weight:  [60.9 kg] 60.9 kg (03/08 0500) Last BM Date : 01/20/23  Weight change: Filed Weights   01/19/23 0500 01/20/23 0421 01/21/23 0500  Weight: 60 kg 59.9 kg 60.9 kg    Intake/Output:   Intake/Output Summary (Last 24 hours) at 01/21/2023 0914 Last data filed at 01/21/2023 0800 Gross per 24 hour  Intake 1219.54 ml  Output 800 ml  Net 419.54 ml   Physical Exam   CVP 11 General: frail, elderly and weak appearing, intermittently confused. No respiratory distress HEENT: + Cortrak Neck: supple. JVD 11 cm. Carotids 2+ bilat; no bruits. No lymphadenopathy or thryomegaly appreciated. Cor: PMI nondisplaced. Regular rate & rhythm. No rubs, gallops or murmurs. + left subclavian CVC  Lungs:decreased BS at the bases bilaterally  Abdomen: soft, nontender, nondistended. No hepatosplenomegaly. No bruits or masses. Good bowel sounds. Extremities: no cyanosis, clubbing, rash, edema Neuro: alert, cranial nerves grossly intact. moves all 4  extremities w/o difficulty. Affect flat   Telemetry   NSR 80s w/ occasional PVCs   Labs    CBC Recent Labs    01/20/23 1848 01/20/23 2329 01/21/23 0309  WBC 12.7*  --  17.1*  HGB 11.0* 11.2* 11.4*  HCT 32.8* 33.0* 35.0*  MCV 110.4*  --  111.1*  PLT 165  --  123456   Basic Metabolic Panel Recent Labs    01/19/23 0523 01/19/23 1007 01/20/23 2214 01/20/23 2329 01/21/23 0309  NA 147*   < > 147* 151* 150*  K 4.0   < > 3.9 4.0 4.0  CL 118*   < > 117*  --  118*  CO2 22   < > 22  --  22  GLUCOSE 124*   < > 146*  --  133*  BUN 32*   < > 38*  --  38*  CREATININE 1.13   < > 0.98  --  0.99  CALCIUM 9.0   < > 11.0*  --  10.7*  MG 2.4  --  2.7*  --   --   PHOS 3.7  --  2.7  --  2.0*   < > = values in this interval not displayed.   Liver Function Tests Recent Labs    01/20/23 2214  AST 37  ALT 47*  ALKPHOS 110  BILITOT 1.6*  PROT 7.1  ALBUMIN 2.6*   No results for input(s): "LIPASE", "AMYLASE" in the last 72 hours. Cardiac Enzymes No results for input(s): "CKTOTAL", "CKMB", "CKMBINDEX", "  TROPONINI" in the last 72 hours.  BNP: BNP (last 3 results) Recent Labs    12/07/22 1307 12/31/22 1156 01/13/23 1344  BNP >4,500.0* 1,669.4* 1,073.1*    ProBNP (last 3 results) Recent Labs    06/18/22 1331 10/05/22 0848  PROBNP 1,795* 3,434*     D-Dimer No results for input(s): "DDIMER" in the last 72 hours. Hemoglobin A1C No results for input(s): "HGBA1C" in the last 72 hours. Fasting Lipid Panel Recent Labs    01/20/23 0425  TRIG 96   Thyroid Function Tests No results for input(s): "TSH", "T4TOTAL", "T3FREE", "THYROIDAB" in the last 72 hours.  Invalid input(s): "FREET3"  Other results:   Imaging    No results found.   Medications:     Scheduled Medications:  Chlorhexidine Gluconate Cloth  6 each Topical Daily   docusate  100 mg Per Tube BID   feeding supplement (PROSource TF20)  60 mL Per Tube Daily   free water  150 mL Per Tube Q4H    furosemide  40 mg Intravenous Once   glycopyrrolate  0.2 mg Intravenous TID   insulin aspart  0-9 Units Subcutaneous Q4H   levothyroxine  175 mcg Per Tube Daily   lidocaine  1 patch Transdermal Q24H   mouth rinse  15 mL Mouth Rinse 4 times per day   polyethylene glycol  17 g Per Tube Daily    Infusions:  sodium chloride Stopped (01/21/23 0105)   ceFEPime (MAXIPIME) IV Stopped (01/20/23 2317)   feeding supplement (OSMOLITE 1.5 CAL) 55 mL/hr at 01/21/23 0404   heparin 1,100 Units/hr (01/21/23 0800)   sodium phosphate 30 mmol in dextrose 5 % 250 mL infusion 43 mL/hr at 01/21/23 0800    PRN Medications: HYDROmorphone (DILAUDID) injection, lip balm, ondansetron (ZOFRAN) IV, mouth rinse    Patient Profile  Curtis Ramos is a 89 year old with a history of HFrEF, PVCs, LBBB, MAI infection, GERD, HTN, hypothyroidism, and thyroid cancer.   Admitted  PEA arrest/shock. CPR  ~ 9 min -->nondisplaced transverse fracture of mid-lower sternal body, acute fx of costal cartilage of bilateral 2nd-6th ribs + 7th costal cartilage, acute fx of anterior 2nd-7th ribs.    Assessment/Plan   1. Cardiac Arrest--> Shock --> cardiogenic shock - PEA arrest--> 9 min CPR 4 rounds epi. Unshockable rhythm.  - Rib fracture 2-7 ribs  - Off pressors.   2. A/C HFrEF, NICM -->Cardiogenic shock - Echo 5/20 EF 20-25% - Echo 12/21 EF 55-60% - Echo  11/23   EF 20-25% LV markedly dilated + dyssynchrony - RV ok  Moderate MR/TR severe biatrial enlargement  - Cath 2012: No CAD (cath not repeated as low suspicion for iCM and CKD 3b) - cMRI 2/24: LBBB, LV EF 17%. RV EF 33%. Severe LAEt. No LGE.  - Zio 2/24  AFL 100% avg rat 67 PVC 1.8% - Off pressors.  -  hold off on GDMT. Consider starting tomorrow. - CVP 11. Give 40 mg IV Lasix x 1 today  - Can pull central line today w/ increasing WBC    3. Acute Hypoxic Respiratory Failure in setting of cardiac arrest and aspiration PNA/sternal fractures - Extubated 3/3 but  decompensated and required reintubation. - Continue abx (unasyn has high salt load watch fluid status) - Extubated 01/19/23. Sats stable on 2 liters [place].   4. Chronic AF and frequent PVCs - has been on amio as outpatient.  - Continue to hold amio with bradycardia.  - off AC with fractures-.  -  Continue  heparin drip. Down the road switch to Eliquis.  - SR today.   5. LBBB - was being worked up for CRT  6. Rib/sternal fractures + scalp hematoma - due to CPR.  -CT - R scalp hematoma. No acute intracranial abnormality.     7. AKI due to ATN/shock - Creatinine baseline~ 1.6 - Resolved.   8. Hypothyroidism  - H/O Thyroid cancer. TSH 29 T4 1.26 - Suspect in the setting of acute event.   9. Shock liver - due to arrest -> LFTs continue to improve.   10. Hypernatremia - Na 150 - free water boluses   11. Bradycardia -He has not been on nodal blockers. Off amio on admit  - Resolved.    12. GOC Palliative Care following. Full Code.     Length of Stay: [address], [place]  01/21/2023, 9:14 AM  Advanced Heart Failure Team Pager [phone] (M-F; 7a - 5p)  Please contact [facility] for night-coverage after hours (5p -7a ) and weekends on [email or web address]

## 2023-01-21 NOTE — Progress Notes (Addendum)
MD following this pt made aware of pts bloody BM this AM w this RN. Hep on hold

## 2023-01-21 NOTE — TOC Progression Note (Signed)
Transition of Care [facility]) - Progression Note    Patient Details  Name: Curtis Ramos MRN: [id number] Date of Birth: Nov 20, 1931  Transition of Care [facility]) CM/SW Contact  Erenest Rasher, RN Phone Number: [phone] 01/21/2023, 1:20 PM  Clinical Narrative:    CM spoke to son, [name] and he agreeable to LTAC. Contacted [employer] rep, Anderson Malta to start auth process. States she will reach out to son and discuss LTAC care and offer to give family a tour. Attending updated.    Expected Discharge Plan: Long Term Acute Care (LTAC) Barriers to Discharge: Continued Medical Work up  Expected Discharge Plan and Services   Discharge Planning Services: CM Consult Post Acute Care Choice: Long Term Acute Care (LTAC) Living arrangements for the past 2 months: Single Family Home                                       Social Determinants of Health (SDOH) Interventions SDOH Screenings   Food Insecurity: No Food Insecurity (01/18/2023)  Housing: Low Risk  (01/18/2023)  Transportation Needs: No Transportation Needs (01/18/2023)  Utilities: Not At Risk (01/18/2023)  Financial Resource Strain: Low Risk  (01/18/2023)  Tobacco Use: Low Risk  (01/13/2023)    Readmission Risk Interventions     No data to display

## 2023-01-21 NOTE — Progress Notes (Signed)
RT removed patient from Bipap and placed on HHFNC 40L, 50%. Patient tolerating well at this time. RN notified. RT will continue to monitor.

## 2023-01-21 NOTE — Progress Notes (Signed)
Palliative Medicine Progress Note   Patient Name: Curtis Ramos       Date: 01/21/2023 DOB: [birth year]  Age: 89 y.o. MRN#: [id number] Attending Physician: Kathlen Mody, MD Primary Care Physician: Ailene Ravel, MD Admit Date: 01/13/2023    HPI/Patient Profile: Palliative Care consult requested for goals of care discussion in this 89 y.o. male  with past medical history of atrial fibrillation (Eliquis), GERD, hypothyroidism, thyroid cancer, hypertension, systolic heart failure with severe LV dilation, EF 17%, LBBB as managed by Dr. Gala Romney, MAC.  He was admitted on 01/13/2023 after being found down at work where he fell between the radiator and the wall.  At that time he was unresponsive.  Patient suffered PEA arrest undergoing CPR for 9 minutes before ROSC while in the ED.  Extubated on 3/3 but decompensated and required reintubation. Extubated again on 3/6.   Subjective: Chart reviewed. Patient required BiPAP overnight. WBC increased from 12 to 17. Had a bloody BM this morning.   Bedside visit. Patient is on HFNC at 40L.  He is resting in bed with his eyes closed and appears to be comfortable.  Family reports he has been interactive with them.  Difficult to know how much he is processing about his current medical situation.  Discussion had with family regarding Mr. Spears current clinical status.  Family shares they are taking things "1 day at a time".  They feel that Mr. Feffer has greatly improved from where he was a few days ago.  They remain open to all offered and available medical interventions to prolong life and are ultimately hopeful for improvement.     Objective:  Physical Exam Vitals reviewed.  Constitutional:      General: He is sleeping. He is not in acute distress.     Appearance: He is ill-appearing.  Cardiovascular:     Rate and Rhythm: Normal rate.  Pulmonary:     Effort: Tachypnea present. No respiratory distress.             Vital Signs: BP (!) 127/58   Pulse 83   Temp 98.5 F (36.9 C) (Oral)   Resp (!) 31   Ht 5\' 11"  (1.803 m)   Wt 60.9 kg   SpO2 100%   BMI 18.73 kg/m  SpO2: SpO2: 100 % O2 Device: O2 Device: High  Flow Nasal Cannula O2 Flow Rate: O2 Flow Rate (L/min): 40 L/min     Palliative Medicine Assessment & Plan   Assessment: Principal Problem:   Cardiac arrest [facility]) Active Problems:   Septic shock (HCC)   Acute respiratory failure with hypoxia (HCC)   Protein-calorie malnutrition, severe    Recommendations/Plan: Full code/full scope Allow time for outcomes Ongoing palliative support   Prognosis:  Very guarded   Thank you for allowing the Palliative Medicine Team to assist in the care of this patient.   Merry Proud, NP   Please contact Palliative Medicine Team phone at [phone] for questions and concerns.  For individual providers, please see AMION.

## 2023-01-21 NOTE — Progress Notes (Signed)
SLP Cancellation Note  Patient Details Name: ARMANY TORREZ MRN: [id number] DOB: Aug 01, 1932   Cancelled treatment:       Reason Eval/Treat Not Completed: Patient not medically ready. Urine retention. Poor secretion management, risk for decompensation. Will f/u Monday for needs.    Duell Holdren, [name] 01/21/2023, 12:33 PM

## 2023-01-21 NOTE — Progress Notes (Signed)
[facility]                                                                               Curtis Ramos, is a 89 y.o. male, DOB - 03-Dec-1931, [id number] Admit date - 01/13/2023    Outpatient Primary MD for the patient is [name], [name], MD  LOS - 8  days    Brief summary   89 year old man with prior history of hypertension nonischemic cardiomyopathy and chronic systolic heart failure with left ventricle ejection fraction of 17%, atrial fibrillation on Eliquis, hypothyroidism, history of thyroid cancer, GERD presented to [name] ED on 2/29 after unwitnessed fall at work.  As per the PCCM notes patient was found down between the radiator and the wall unresponsive with contusion.  On arrival to ED patient became apneic and lost pulses and cardiac monitoring showed PEA arrest.  CPR administered for 9 minutes with epi x 4 and ROSC achieved.  Patient was intubated.  Arrest and cardiology was consulted. CT of the chest abdomen and pelvis showed known displaced transverse fractures of the mid lower sternal body, acute fracture of the coastal cartilage of bilateral second through sixth ribs, acute fracture of the anterior second to 7 ribs.  He was admitted to [facility] service.  He was started on Unasyn for aspiration pneumonitis, later transitioned to cefepime for Pseudomonas VAP. He was extubated on 01/19/2023, and transferred to [facility] on 01/21/2023   Assessment & Plan    Assessment and Plan:   PEA arrest/ Cardiogenic shock:  Weaned off vasopressors.  Cardiology on board    Acute hypoxic respiratory failure secondary to pneumonia and pulmonary edema in the setting of sternal fractures and aspiration pneumonia Extubated on 01/20/2023 Patient currently on high flow nasal cannula oxygen   Septic shock secondary to pneumonia//COVID neurogenic shock/ pseudomonas VAP Lactic acidosis resolved On IV antibiotics cefepime.  Respiratory cultures show pseudomonas.  Improving leukocytosis  and he remains afebrile. Tachypneic and on HF [place] oxygen.    NICM/ chronic systolic heart failure:  Cardiology on board and he received a dose of IV lasix.     Chronic atrial fibrillation with frequent PVC's - rate high between 90 to 100/min with PVC's Amiodarone restarted.  On IV heparin, which is held for BRBPR.    BRBPR this morning.  No external hemorrhoids evident.  Repeat hemoglobin around 10.2 drom from 11.4. holding heparin until GI evaluation.  Monitor hemoglobin and  watch for further bleeding.    Hypothyroidism:  Int he setting of h.o thyroid cancer Recommend checking thyroid panel in 2 to 4 weeks when acute illness improves.  Resume levothyroxine.    Hypernatremia Free water boluses and recheck sodium in the morning.     AKI due to shock:  Resolved.    Hypophosphatemia:  Replaced.    Hypercalcemia:  Monitor for now.    Macrocytic anemia:    Nutrition:  On cortrak with tube feeds.     RN Pressure Injury Documentation:    Malnutrition Type:  Nutrition Problem: Severe Malnutrition Etiology: chronic illness (CHF)   Malnutrition Characteristics:  Signs/Symptoms: severe fat depletion, severe muscle depletion   Nutrition Interventions:  Interventions: Refer to  RD note for recommendations  Estimated body mass index is 18.73 kg/m as calculated from the following:   Height as of this encounter: '5\' 11"'$  (1.803 m).   Weight as of this encounter: 60.9 kg.  Code Status: full code.  DVT Prophylaxis:  SCDs Start: 01/13/23 1327   Level of Care: Level of care: Progressive Family Communication: son at bedside.   Disposition Plan:     Remains inpatient appropriate:  still on high flow oxygen.     Significant Hospital events 2/29 - Presented to 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 Found between radiator and wall with R-sided head injury. Suspected cardiac etiology of unresponsiveness. On ED arrival,  became pulseless (rhythm PEA). CPR x 9 min, Epi x 4 with ROSC. Intubated peri-arrest. CT Head, C-Spine, Chest/A/P completed (as above). Unasyn for ?aspiration. R axillary A-line, L subclavian CVC, ETT tube exchange + bronch.  3/3 - Extubated, back into respiratory failure, reintubated 3/3 - OG tube placement complications, completely replaced, still trouble with placement 3/4 - Started cefepime for respiratory culture showing abundant pseudomonas 3/7 extubated.  3/8 - Transferred to [name] on 3/8 3/8 - GI consulted for BRBPR.     Consultants:   Cardiology PCCM Gastroenterology.  Antimicrobials:   Anti-infectives (From admission, onward)    Start     Dose/Rate Route Frequency Ordered Stop   01/17/23 1100  ceFEPIme (MAXIPIME) 2 g in sodium chloride 0.9 % 100 mL IVPB        2 g 200 mL/hr over 30 Minutes Intravenous Every 12 hours 01/17/23 1040 01/24/23 1059   01/16/23 1400  cefTRIAXone (ROCEPHIN) 2 g in sodium chloride 0.9 % 100 mL IVPB  Status:  Discontinued        2 g 200 mL/hr over 30 Minutes Intravenous Every 24 hours 01/16/23 1136 01/17/23 1028   01/15/23 0815  Ampicillin-Sulbactam (UNASYN) 3 g in sodium chloride 0.9 % 100 mL IVPB  Status:  Discontinued        3 g 200 mL/hr over 30 Minutes Intravenous Every 6 hours 01/15/23 0804 01/16/23 1136   01/13/23 1345  Ampicillin-Sulbactam (UNASYN) 3 g in sodium chloride 0.9 % 100 mL IVPB  Status:  Discontinued        3 g 200 mL/hr over 30 Minutes Intravenous Every 12 hours 01/13/23 1339 01/15/23 0804        Medications  Scheduled Meds:  amiodarone  200 mg Per Tube Daily   Chlorhexidine Gluconate Cloth  6 each Topical Daily   feeding supplement (PROSource TF20)  60 mL Per Tube Daily   free water  200 mL Per Tube Q4H   glycopyrrolate  0.2 mg Intravenous TID   insulin aspart  0-9 Units Subcutaneous Q4H   levothyroxine  175 mcg Per Tube Daily   lidocaine  1 patch Transdermal Q24H   mouth rinse  15 mL Mouth Rinse 4 times per day    Continuous Infusions:  sodium chloride Stopped (01/21/23 1150)   ceFEPime (MAXIPIME) IV 200 mL/hr at 01/21/23 1200   feeding supplement (OSMOLITE 1.5 CAL) 1,000 mL (01/21/23 1148)   heparin Stopped (01/21/23 1129)   PRN Meds:.docusate, HYDROmorphone (DILAUDID) injection, lip balm, ondansetron (ZOFRAN) IV, mouth rinse, polyethylene glycol    Subjective:   Sun Afifi was seen and examined today.  Moaning, able to move all extremities.   Objective:   Vitals:   01/21/23 1000 01/21/23 1100 01/21/23 1117 01/21/23 1200  BP:  Pulse: (!) 109 87  83  Resp: (!) 23 (!) 33  (!) 31  Temp:   98.5 F (36.9 C)   TempSrc:   Oral   SpO2: 91% 100%  100%  Weight:      Height:        Intake/Output Summary (Last 24 hours) at 01/21/2023 1358 Last data filed at 01/21/2023 1252 Gross per 24 hour  Intake 1387.58 ml  Output 2175 ml  Net -787.42 ml   Filed Weights   01/19/23 0500 01/20/23 0421 01/21/23 0500  Weight: 60 kg 59.9 kg 60.9 kg     Exam General exam: Appears calm and comfortable  Respiratory system: Clear to auscultation. Respiratory effort normal. Cardiovascular system: S1 & S2 heard, Irregularly irregular, No JVD,  Gastrointestinal system: Abdomen is nondistended, soft and nontender.  Central nervous system: Alert and oriented. No focal neurological deficits. Extremities: Symmetric 5 x 5 power. Skin: No rashes, lesions or ulcers Psychiatry: unable to assess.     Data Reviewed:  I have personally reviewed following labs and imaging studies   CBC Lab Results  Component Value Date   WBC 14.1 (H) 01/21/2023   RBC 2.85 (L) 01/21/2023   HGB 10.2 (L) 01/21/2023   HCT 32.3 (L) 01/21/2023   MCV 113.3 (H) 01/21/2023   MCH 35.8 (H) 01/21/2023   PLT 190 01/21/2023   MCHC 31.6 01/21/2023   RDW 14.8 [id number]     Last metabolic panel Lab Results  Component Value Date   NA 150 (H) 01/21/2023   K 4.0 01/21/2023   CL 118 (H) 01/21/2023   CO2 22 01/21/2023   BUN 38  (H) 01/21/2023   CREATININE 0.99 01/21/2023   GLUCOSE 133 (H) 01/21/2023   GFRNONAA >60 01/21/2023   GFRAA 76 10/15/2020   CALCIUM 10.7 (H) 01/21/2023   PHOS 2.0 (L) 01/21/2023   PROT 7.1 01/20/2023   ALBUMIN 2.6 (L) 01/20/2023   LABGLOB 2.6 04/16/2020   AGRATIO 1.7 04/16/2020   BILITOT 1.6 (H) 01/20/2023   ALKPHOS 110 01/20/2023   AST 37 01/20/2023   ALT 47 (H) 01/20/2023   ANIONGAP 10 01/21/2023    CBG (last 3)  Recent Labs    01/21/23 0322 01/21/23 0737 01/21/23 1113  GLUCAP 121* 112* 128*      Coagulation Profile: No results for input(s): "INR", "PROTIME" in the last 168 hours.   Radiology Studies: No results found.     Hosie Poisson M.D. [facility] 01/21/2023, 1:58 PM  Available via Epic secure chat 7am-7pm After 7 pm, please refer to night coverage provider listed on amion.

## 2023-01-21 NOTE — Progress Notes (Addendum)
ANTICOAGULATION CONSULT NOTE - Initial Consult  Pharmacy Consult for IV heparin Indication: atrial fibrillation  No Known Allergies  Patient Measurements: Height: '5\' 11"'$  (180.3 cm) Weight: 60.9 kg (134 lb 4.2 oz) IBW/kg (Calculated) : 75.3 Heparin Dosing Weight: 58.7 kg (TBW)   Vital Signs: Temp: 98.3 F (36.8 C) (03/08 0841) Temp Source: Oral (03/08 0841) BP: 127/58 (03/08 0720) Pulse Rate: 87 (03/08 0800)  Labs: Recent Labs    01/19/23 2347 01/20/23 0425 01/20/23 1848 01/20/23 2214 01/20/23 2329 01/21/23 0309  HGB  --  10.7* 11.0*  --  11.2* 11.4*  HCT  --  32.0* 32.8*  --  33.0* 35.0*  PLT  --  135* 165  --   --  203  APTT 74* 74* 61*  --   --  64*  HEPARINUNFRC >1.10* >1.10*  --   --   --  1.08*  CREATININE  --  1.02  --  0.98  --  0.99     Estimated Creatinine Clearance: 44.4 mL/min (by C-G formula based on SCr of 0.99 mg/dL).   Medical History: Past Medical History:  Diagnosis Date   Acute on chronic combined systolic and diastolic CHF (congestive heart failure) ([place]) 03/19/2019   AKI (acute kidney injury) ([place]) 12/26/2014   Arthralgia 11/04/2014   Arthritis    "proabably"   Atypical mycobacterium infection    Cancer [facility])    family unaware of this hx on [id number]   Chronic systolic heart failure (HCC)    Decreased oral intake 12/26/2014   Decreased oral intake 12/26/2014   Depressed left ventricular ejection fraction 02/05/2020   Dilated cardiomyopathy ([place]) 03/19/2019   Ejection fraction 20 to 25% based on echocardiogram done by pulmonologist month ago.   DJD (degenerative joint disease) 10/07/2014   Dyspnea on exertion 03/19/2019   Essential hypertension 02/05/2020   GERD (gastroesophageal reflux disease)    Hammer toes of both feet 01/30/2018   History of thyroid cancer 12/16/2016   Hypercalcemia 12/26/2014   Hypothyroidism    Hypothyroidism associated with surgical procedure 10/07/2014   Lung mass    Myalgia 11/04/2014   Mycobacterium avium complex  ([place])    Nail dystrophy 01/30/2018   Occult malignancy (HCC)    PAF (paroxysmal atrial fibrillation) ([place]) 02/05/2020   Persistent atrial fibrillation ([place]) 03/19/2019   Chads 2 Vascor equals 3   PONV (postoperative nausea and vomiting)    Pre-ulcerative calluses 01/30/2018   Pseudoaneurysm following procedure ([place]) 04/16/2020   Sensorineural hearing loss (SNHL), bilateral 08/11/2018   Shingles    Toenail fungus 01/30/2018   Wears glasses     Assessment: 64 YOM with hx afib, on Eliquis PTA, last dose 2/29 PTA. On subcutaneous heparin for VTE prophylaxis. Pt with R-scalp hematoma on 2/29, CT head negative for intracranial hemorrhage.Pharmacy consulted to start IV heparin while Eliquis is on hold. Discussed with HF team, will start heparin with no bolus and slowly titrate to lower heparin level/aPTT goal.   Daily aPTT 64 slightly subtherapeutic. HL >1.08 due to recent Eliquis, not yet correlating with aPTT but starting to trend down. Hgb 11.4 stable, PLTs 203 stable. Overnight had oozing from CVC, RN provided Surgicel pad, as well as 3/8 AM. D/w HF team and decision was made to remove CVC.   Goal of Therapy:  Heparin level 0.3-0.5 units/ml aPTT 66-85 seconds Monitor platelets by anticoagulation protocol: Yes   Plan:  D/w HF team, okay to continue heparin at 1,100 units/hr Daily aPTT and HL until correlates Monitor  s/sx bleeding F/u transition back to [place], PharmD PGY1 Pharmacy Resident   01/21/2023  10:24 AM   ADDENDUM:   Patient noted to have bloody bowel movement. Heparin gtt has been paused since ~11:00 per hospitalist. Repeat CBC with Hgb 10.2 and PLTs 190. GI is consulted. Follow up GI plans to resume vs discontinue heparin.  Eliseo Gum, PharmD PGY1 Pharmacy Resident   01/21/2023  2:55 PM

## 2023-01-22 ENCOUNTER — Inpatient Hospital Stay (HOSPITAL_COMMUNITY): Payer: Medicare Other

## 2023-01-22 DIAGNOSIS — E43 Unspecified severe protein-calorie malnutrition: Secondary | ICD-10-CM | POA: Diagnosis not present

## 2023-01-22 DIAGNOSIS — I469 Cardiac arrest, cause unspecified: Secondary | ICD-10-CM | POA: Diagnosis not present

## 2023-01-22 DIAGNOSIS — A419 Sepsis, unspecified organism: Secondary | ICD-10-CM | POA: Diagnosis not present

## 2023-01-22 DIAGNOSIS — J9601 Acute respiratory failure with hypoxia: Secondary | ICD-10-CM | POA: Diagnosis not present

## 2023-01-22 LAB — POCT I-STAT 7, (LYTES, BLD GAS, ICA,H+H)
Acid-Base Excess: 1 mmol/L (ref 0.0–2.0)
Bicarbonate: 24.9 mmol/L (ref 20.0–28.0)
Calcium, Ion: 1.44 mmol/L — ABNORMAL HIGH (ref 1.15–1.40)
HCT: 31 % — ABNORMAL LOW (ref 39.0–52.0)
Hemoglobin: 10.5 g/dL — ABNORMAL LOW (ref 13.0–17.0)
O2 Saturation: 93 %
Patient temperature: 38.1
Potassium: 3.6 mmol/L (ref 3.5–5.1)
Sodium: 150 mmol/L — ABNORMAL HIGH (ref 135–145)
TCO2: 26 mmol/L (ref 22–32)
pCO2 arterial: 36.1 mmHg (ref 32–48)
pH, Arterial: 7.452 — ABNORMAL HIGH (ref 7.35–7.45)
pO2, Arterial: 68 mmHg — ABNORMAL LOW (ref 83–108)

## 2023-01-22 LAB — CBC
HCT: 33.4 % — ABNORMAL LOW (ref 39.0–52.0)
Hemoglobin: 11.4 g/dL — ABNORMAL LOW (ref 13.0–17.0)
MCH: 37.4 pg — ABNORMAL HIGH (ref 26.0–34.0)
MCHC: 34.1 g/dL (ref 30.0–36.0)
MCV: 109.5 fL — ABNORMAL HIGH (ref 80.0–100.0)
Platelets: 223 10*3/uL (ref 150–400)
RBC: 3.05 MIL/uL — ABNORMAL LOW (ref 4.22–5.81)
RDW: 14.6 % (ref 11.5–15.5)
WBC: 15.6 10*3/uL — ABNORMAL HIGH (ref 4.0–10.5)
nRBC: 0 % (ref 0.0–0.2)

## 2023-01-22 LAB — BASIC METABOLIC PANEL
Anion gap: 10 (ref 5–15)
BUN: 36 mg/dL — ABNORMAL HIGH (ref 8–23)
CO2: 23 mmol/L (ref 22–32)
Calcium: 9.9 mg/dL (ref 8.9–10.3)
Chloride: 115 mmol/L — ABNORMAL HIGH (ref 98–111)
Creatinine, Ser: 1.02 mg/dL (ref 0.61–1.24)
GFR, Estimated: >60 mL/min (ref >60)
Glucose, Bld: 123 mg/dL — ABNORMAL HIGH (ref 70–99)
Potassium: 3.4 mmol/L — ABNORMAL LOW (ref 3.5–5.1)
Sodium: 148 mmol/L — ABNORMAL HIGH (ref 135–145)

## 2023-01-22 LAB — GLUCOSE, CAPILLARY
Glucose-Capillary: 120 mg/dL — ABNORMAL HIGH (ref 70–99)
Glucose-Capillary: 117 mg/dL — ABNORMAL HIGH (ref 70–99)
Glucose-Capillary: 101 mg/dL — ABNORMAL HIGH (ref 70–99)
Glucose-Capillary: 107 mg/dL — ABNORMAL HIGH (ref 70–99)
Glucose-Capillary: 110 mg/dL — ABNORMAL HIGH (ref 70–99)
Glucose-Capillary: 110 mg/dL — ABNORMAL HIGH (ref 70–99)

## 2023-01-22 LAB — MAGNESIUM: Magnesium: 2.5 mg/dL — ABNORMAL HIGH (ref 1.7–2.4)

## 2023-01-22 LAB — HEPARIN LEVEL (UNFRACTIONATED)
Heparin Unfractionated: 0.88 IU/mL — ABNORMAL HIGH (ref 0.30–0.70)
Heparin Unfractionated: 0.86 IU/mL — ABNORMAL HIGH (ref 0.30–0.70)

## 2023-01-22 LAB — APTT
aPTT: 57 seconds — ABNORMAL HIGH (ref 24–36)
aPTT: 68 seconds — ABNORMAL HIGH (ref 24–36)

## 2023-01-22 LAB — PHOSPHORUS: Phosphorus: 1.8 mg/dL — ABNORMAL LOW (ref 2.5–4.6)

## 2023-01-22 MED ORDER — ACETAMINOPHEN 500 MG PO TABS
500.0000 mg | ORAL_TABLET | Freq: Four times a day (QID) | ORAL | Status: AC | PRN
  Administered 2023-01-22 – 2023-01-31 (×3): 500 mg
  Filled 2023-01-22 (×3): qty 1

## 2023-01-22 MED ORDER — ACETAMINOPHEN 160 MG/5ML PO SOLN
ORAL | Status: AC
  Administered 2023-01-22: 650 mg
  Filled 2023-01-22: qty 20.3

## 2023-01-22 MED ORDER — POTASSIUM PHOSPHATES 15 MMOLE/5ML IV SOLN
30.0000 mmol | Freq: Once | INTRAVENOUS | Status: AC
  Administered 2023-01-22: 30 mmol via INTRAVENOUS
  Filled 2023-01-22: qty 10

## 2023-01-22 MED ORDER — FUROSEMIDE 10 MG/ML IJ SOLN
20.0000 mg | Freq: Once | INTRAMUSCULAR | Status: AC
  Administered 2023-01-22: 20 mg via INTRAVENOUS
  Filled 2023-01-22: qty 2

## 2023-01-22 NOTE — Progress Notes (Signed)
Pt continues to run a temp even after PRNs used. Ice packs applied

## 2023-01-22 NOTE — Progress Notes (Signed)
PT Cancellation Note  Patient Details Name: Curtis Ramos MRN: [id number] DOB: 28-Jul-1932   Cancelled Treatment:    Reason Eval/Treat Not Completed: Patient not medically ready (Discussed with RN; pt not medically ready for PT evaluation).  Wyona Almas, PT, DPT Acute Rehabilitation Services Office [phone]    Deno Etienne 01/22/2023, 10:10 AM

## 2023-01-22 NOTE — Progress Notes (Signed)
[facility]                                                                               Curtis Ramos, is a 89 y.o. male, DOB - 06-04-1932, [id number] Admit date - 01/13/2023    Outpatient Primary MD for the patient is Ramos, Curtis Mercy, MD  LOS - 9  days    Brief summary   89 year old man with prior history of hypertension nonischemic cardiomyopathy and chronic systolic heart failure with left ventricle ejection fraction of 17%, atrial fibrillation on Eliquis, hypothyroidism, history of thyroid cancer, GERD presented to [facility], ED on 2/29 after unwitnessed fall at work.  As per the PCCM notes patient was found down between the radiator and the wall unresponsive with contusion.  On arrival to ED patient became apneic and lost pulses and cardiac monitoring showed PEA arrest.  CPR administered for 9 minutes with epi x 4 and ROSC achieved.  Patient was intubated.  Arrest and cardiology was consulted. CT of the chest abdomen and pelvis showed known displaced transverse fractures of the mid lower sternal body, acute fracture of the coastal cartilage of bilateral second through sixth ribs, acute fracture of the anterior second to 7 ribs.  He was admitted to [name] service.  He was started on Unasyn for aspiration pneumonitis, later transitioned to cefepime for Pseudomonas VAP. He was extubated on 01/19/2023, and transferred to [name] on 01/21/2023   Assessment & Plan    Assessment and Plan:   PEA arrest/ Cardiogenic shock:  Weaned off vasopressors.  Cardiology on board and managing heart failure.    Acute hypoxic respiratory failure secondary to pneumonia and pulmonary edema in the setting of sternal fractures and aspiration pneumonia Extubated on 01/20/2023 He is doing poorly, he was on 40 lit of HF oxygen at 30% fio2.  Earlier this am, he became tachypnea with rates in 40/min. He was transitioned to bipap.  CXR shows worsening infiltrates , . ABG shows low po2. Increased Fio2  to 50%. Pccm re consulted, to see if he needs to be re intubated.    Septic shock secondary to pneumonia//COVID/ pseudomonas VAP Lactic acidosis resolved On IV antibiotics cefepime.  Respiratory cultures show pseudomonas.  Febrile later today, blood cultures ordered.      NICM/ chronic systolic heart failure:  Cardiology on board One dose of IV lasix 20 mg today.     Chronic atrial fibrillation with frequent PVC's - rate high between 90 to 100/min with PVC's On IV heparin for anti coagulation.    BRBPR  No external hemorrhoids evident. None later on.  Repeat hemoglobin stable around 11.  GI consulted,not a candidate for any intervention at this time.     Hypothyroidism:  Int he setting of h.o thyroid cancer Recommend checking thyroid panel in 2 to 4 weeks when acute illness improves.  Resume levothyroxine.    Hypernatremia Free water boluses, sodium improved from 150 to 148.     AKI due to shock:  Resolved.    Hypophosphatemia:  Replaced. Repeat in am.    Hypercalcemia:  Monitor for now.    Macrocytic anemia:    Nutrition:  On cortrak with  tube feeds. Which were held for respiratory distress.    Urinary retention:  S/p foley catheter placement on 01/21/23.     RN Pressure Injury Documentation:    Malnutrition Type:  Nutrition Problem: Severe Malnutrition Etiology: chronic illness (CHF)   Malnutrition Characteristics:  Signs/Symptoms: severe fat depletion, severe muscle depletion   Nutrition Interventions:  Interventions: Refer to RD note for recommendations  Estimated body mass index is 18.02 kg/m as calculated from the following:   Height as of this encounter: '5\' 11"'$  (1.803 m).   Weight as of this encounter: 58.6 kg.  Code Status: full code.  DVT Prophylaxis:  SCDs Start: 01/13/23 1327   Level of Care: Level of care: Progressive Family Communication: family at bedside.   Disposition Plan:     Remains inpatient appropriate:   still on high flow oxygen.     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 Found between radiator and wall with R-sided head injury. Suspected cardiac etiology of unresponsiveness. On ED arrival, became pulseless (rhythm PEA). CPR x 9 min, Epi x 4 with ROSC. Intubated peri-arrest. CT Head, C-Spine, Chest/A/P completed (as above). Unasyn for ?aspiration. R axillary A-line, L subclavian CVC, ETT tube exchange + bronch.  3/3 - Extubated, back into respiratory failure, reintubated 3/3 - OG tube placement complications, completely replaced, still trouble with placement 3/4 - Started cefepime for respiratory culture showing abundant pseudomonas 3/7 extubated.  3/8 - Transferred to [name] on 3/8 3/8 - GI consulted for BRBPR.     Consultants:   Cardiology PCCM Gastroenterology.  Antimicrobials:   Anti-infectives (From admission, onward)    Start     Dose/Rate Route Frequency Ordered Stop   01/17/23 1100  ceFEPIme (MAXIPIME) 2 g in sodium chloride 0.9 % 100 mL IVPB        2 g 200 mL/hr over 30 Minutes Intravenous Every 12 hours 01/17/23 1040 01/24/23 1059   01/16/23 1400  cefTRIAXone (ROCEPHIN) 2 g in sodium chloride 0.9 % 100 mL IVPB  Status:  Discontinued        2 g 200 mL/hr over 30 Minutes Intravenous Every 24 hours 01/16/23 1136 01/17/23 1028   01/15/23 0815  Ampicillin-Sulbactam (UNASYN) 3 g in sodium chloride 0.9 % 100 mL IVPB  Status:  Discontinued        3 g 200 mL/hr over 30 Minutes Intravenous Every 6 hours 01/15/23 0804 01/16/23 1136   01/13/23 1345  Ampicillin-Sulbactam (UNASYN) 3 g in sodium chloride 0.9 % 100 mL IVPB  Status:  Discontinued        3 g 200 mL/hr over 30 Minutes Intravenous Every 12 hours 01/13/23 1339 01/15/23 0804        Medications  Scheduled Meds:  amiodarone  200 mg Per Tube Daily   Chlorhexidine Gluconate Cloth  6 each Topical Daily   feeding supplement (PROSource  TF20)  60 mL Per Tube Daily   free water  200 mL Per Tube Q4H   furosemide  20 mg Intravenous Once   insulin aspart  0-9 Units Subcutaneous Q4H   levothyroxine  175 mcg Per Tube Daily   lidocaine  1 patch Transdermal Q24H   mouth rinse  15 mL Mouth Rinse 4 times per day   pantoprazole (PROTONIX) IV  40 mg Intravenous Q24H   Continuous Infusions:  sodium chloride 10 mL/hr at 01/22/23 1500   ceFEPime (MAXIPIME) IV Stopped (01/22/23 1218)   feeding supplement (  OSMOLITE 1.5 CAL) 1,000 mL (01/22/23 0926)   heparin 1,200 Units/hr (01/22/23 1500)   potassium PHOSPHATE IVPB (in mmol) 85 mL/hr at 01/22/23 1500   PRN Meds:.acetaminophen, docusate, HYDROmorphone (DILAUDID) injection, lip balm, ondansetron (ZOFRAN) IV, mouth rinse, polyethylene glycol    Subjective:   Curtis Ramos was seen and examined today.  More lethargic, but opening eyes with verbal cues.   Objective:   Vitals:   01/22/23 1300 01/22/23 1400 01/22/23 1500 01/22/23 1554  BP: 99/60 116/61 (!) 106/58   Pulse: 64 62 60   Resp: (!) 26 (!) 27 (!) 25   Temp: (!) 100.4 F (38 C) 99.9 F (37.7 C) 99.5 F (37.5 C)   TempSrc:      SpO2: 100% 100% 100% 98%  Weight:      Height:        Intake/Output Summary (Last 24 hours) at 01/22/2023 1639 Last data filed at 01/22/2023 1500 Gross per 24 hour  Intake 802.29 ml  Output 1385 ml  Net -582.71 ml    Filed Weights   01/20/23 0421 01/21/23 0500 01/22/23 0500  Weight: 59.9 kg 60.9 kg 58.6 kg     Exam General exam: ill appearing gentleman. In distress from tachypnea. Cortrak in place.  Respiratory system: bilateral rhonchi, tachypnea 32/min., on BIPAP.  Cardiovascular system: S1 & S2 heard, irregularly irregular.  No pedal edema.  Gastrointestinal system: Abdomen is nondistended, soft and nontender. Central nervous system: lethargic. Not follow ing commands.  Extremities: no edema.  Skin: No rashes Psychiatry: unable to assess.     Data Reviewed:  I have personally  reviewed following labs and imaging studies   CBC Lab Results  Component Value Date   WBC 15.6 (H) 01/22/2023   RBC 3.05 (L) 01/22/2023   HGB 10.5 (L) 01/22/2023   HCT 31.0 (L) 01/22/2023   MCV 109.5 (H) 01/22/2023   MCH 37.4 (H) 01/22/2023   PLT 223 01/22/2023   MCHC 34.1 01/22/2023   RDW 14.6 [id number]     Last metabolic panel Lab Results  Component Value Date   NA 150 (H) 01/22/2023   K 3.6 01/22/2023   CL 115 (H) 01/22/2023   CO2 23 01/22/2023   BUN 36 (H) 01/22/2023   CREATININE 1.02 01/22/2023   GLUCOSE 123 (H) 01/22/2023   GFRNONAA >60 01/22/2023   GFRAA 76 10/15/2020   CALCIUM 9.9 01/22/2023   PHOS 1.8 (L) 01/22/2023   PROT 7.1 01/20/2023   ALBUMIN 2.6 (L) 01/20/2023   LABGLOB 2.6 04/16/2020   AGRATIO 1.7 04/16/2020   BILITOT 1.6 (H) 01/20/2023   ALKPHOS 110 01/20/2023   AST 37 01/20/2023   ALT 47 (H) 01/20/2023   ANIONGAP 10 01/22/2023    CBG (last 3)  Recent Labs    01/22/23 0719 01/22/23 1115 01/22/23 1522  GLUCAP 117* 101* 107*       Coagulation Profile: No results for input(s): "INR", "PROTIME" in the last 168 hours.   Radiology Studies: DG CHEST PORT 1 VIEW  Result Date: 01/22/2023 CLINICAL DATA:  Pneumonia EXAM: PORTABLE CHEST 1 VIEW COMPARISON:  01/18/2023 FINDINGS: Interval extubation. Enteric tube is again seen with distal tip beyond the inferior margin of the film. Stable heart size. Progressive patchy bilateral airspace opacities most pronounced in the right upper lobe and left lung base. Probable small left pleural effusion. No pneumothorax. IMPRESSION: Progressive patchy bilateral airspace opacities most pronounced in the right upper lobe and left lung base. Electronically Signed   By: Davina Poke D.O.  On: 01/22/2023 11:20       Hosie Poisson M.D. [facility] 01/22/2023, 4:39 PM  Available via Epic secure chat 7am-7pm After 7 pm, please refer to night coverage provider listed on amion.

## 2023-01-22 NOTE — Progress Notes (Addendum)
NAME:  Curtis Ramos, MRN:  [id number], DOB:  02-Mar-1932, LOS: 9 ADMISSION DATE:  01/13/2023, CONSULTATION DATE:  01/13/2023 REFERRING MD:  Trauma, CHIEF COMPLAINT:  Cardiac arrest, post-fall (Level 1 Trauma)   History of Present Illness:  89 year old man who presented to [facility] ED 2/29 via EMS after unwitnessed fall at work. PMHx significant for HTN, HFrEF with NICM (cMRI 12/2022 with severe LV dilation, EF 17%, LBBB, followed by Dr. Haroldine Laws), AFib (on Eliquis), MAI infection, GERD, hypothyroidism, history of thyroid CA.   History obtained from chart review and from family. Patient presented to [facility] ED via EMS after a fall at work; per family, patient did not have any complaints prior to fall. Recently undergoing workup with Cardiology for pacemaker placement. He was found down between a radiator and the wall, unresponsive with contusion and deformities to R side of his head. Initially with tachycardic with EMS, then bradycardic. ST elevation noted on EKG and Code STEMI was called, later called off as ST elevation had resolved. Patient lost pulses while in ED and became apneic with cardiac monitoring showing PEA arrest. CPR administered x 9 minutes with Epi x 4 and ROSC, patient never had a shockable rhythm. Intubated peri-arrest. Cards was consulted, noting high risk of VT/VF in the setting of severe CM. AHF was also notified of admission.   Labs demonstrated WBC 13.9, Hgb 14.1, Plt 169. INR 2.2 (on Eliquis). Na 138, K 4.1, CO2 18, Cr 1.79 (baseline ~1.3). CK 205. LA 7.0 > 4.6. Trop 27 > 155. BNP 1073. UA with glucose > 500, +protein. PCT < 0.10. CT Head NAICA, R parietal scalp hematoma. CT C-spine negative for acute findings. CT Chest/A/P with nondisplaced transverse fracture of mid-lower sternal body, acute fx of costal cartilage of bilateral 2nd-6th ribs + 7th costal cartilage, acute fx of anterior 2nd-7th ribs; pulmonary lobular interstitial thickening (?pulm edema), BLL opacities, trace ascites.  Empiric Unasyn started for ?aspiration.   PCCM was consulted for ICU admission. Pertinent  Medical History   Past Medical History:  Diagnosis Date   Acute on chronic combined systolic and diastolic CHF (congestive heart failure) ([place]) 03/19/2019   AKI (acute kidney injury) ([place]) 12/26/2014   Arthralgia 11/04/2014   Arthritis    "proabably"   Atypical mycobacterium infection    Cancer [facility])    family unaware of this hx on [id number]   Chronic systolic heart failure (HCC)    Decreased oral intake 12/26/2014   Decreased oral intake 12/26/2014   Depressed left ventricular ejection fraction 02/05/2020   Dilated cardiomyopathy ([place]) 03/19/2019   Ejection fraction 20 to 25% based on echocardiogram done by pulmonologist month ago.   DJD (degenerative joint disease) 10/07/2014   Dyspnea on exertion 03/19/2019   Essential hypertension 02/05/2020   GERD (gastroesophageal reflux disease)    Hammer toes of both feet 01/30/2018   History of thyroid cancer 12/16/2016   Hypercalcemia 12/26/2014   Hypothyroidism    Hypothyroidism associated with surgical procedure 10/07/2014   Lung mass    Myalgia 11/04/2014   Mycobacterium avium complex ([place])    Nail dystrophy 01/30/2018   Occult malignancy (HCC)    PAF (paroxysmal atrial fibrillation) ([place]) 02/05/2020   Persistent atrial fibrillation ([place]) 03/19/2019   Chads 2 Vascor equals 3   PONV (postoperative nausea and vomiting)    Pre-ulcerative calluses 01/30/2018   Pseudoaneurysm following procedure ([place]) 04/16/2020   Sensorineural hearing loss (SNHL), bilateral 08/11/2018   Shingles    Toenail fungus 01/30/2018   Wears  glasses    Significant Hospital Events: Including procedures, antibiotic start and stop dates in addition to other pertinent events   2/29 - Presented to [facility] ED after 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 Found between radiator and wall with R-sided head injury. Suspected cardiac etiology of unresponsiveness. On ED arrival,  became pulseless (rhythm PEA). CPR x 9 min, Epi x 4 with ROSC. Intubated peri-arrest. CT Head, C-Spine, Chest/A/P completed (as above). Unasyn for ?aspiration. R axillary A-line, L subclavian CVC, ETT tube exchange + bronch.  3/3 - Extubated, back into respiratory failure, reintubated 3/3 - OG tube placement complications, completely replaced, still trouble with placement 3/4 - Started cefepime for respiratory culture showing abundant pseudomonas 3/7 extubated 3/8 transferred to [facility] service 3/9 PCCM reconsulted, increased work of breathing, on BiPAP  Interim History / Subjective:  Sedated, comfortable on BiPAP Tachypneic  Objective   Blood pressure 135/76, pulse 86, temperature 99.6 F (37.6 C), temperature source Oral, resp. rate (!) 35, height '5\' 11"'$  (1.803 m), weight 58.6 kg, SpO2 97 %. CVP:  [10 mmHg] 10 mmHg  Vent Mode: BIPAP;PCV FiO2 (%):  [30 %-50 %] 50 % Set Rate:  [8 bmp] 8 bmp PEEP:  [8 cmH20] 8 cmH20   Intake/Output Summary (Last 24 hours) at 01/22/2023 1029 Last data filed at 01/22/2023 0352 Gross per 24 hour  Intake 541.15 ml  Output 2075 ml  Net -1533.85 ml   Filed Weights   01/20/23 0421 01/21/23 0500 01/22/23 0500  Weight: 59.9 kg 60.9 kg 58.6 kg   Examination: Constitutional: Chronically ill-appearing Cardio: S1-S2 appreciated, PVCs on monitor  Pulm: Rhonchi Abdomen: Soft, bowel sounds appreciated Skin:Warm and dry. Neuro: Sedated at present  Resolved Hospital Problem list   AKI Acute encephalopathy, multifactorial Lactic acidosis Hypokalemia Elevated liver enzymes Pulmonary edema Leukocytosis Septic shock Cardiogenic shock  Chest x-ray-pending ABG 7.45/36/68/26  Assessment & Plan:   Acute hypoxemic respiratory failure -Secondary to pneumonia, secondary to heart failure -Continue BiPAP support -Unfortunately, may deteriorate to requiring ventilator support -Has required 2 intubations during this hospitalization -Very frail -Hopefully can be  managed on BiPAP and high flow nasal cannula -Remains on cefepime for Pseudomonas-day 6  Atelectasis secondary to multiple rib fractures, sternal fracture -Pain management  Atrial fibrillation -On amiodarone -On heparin  PEA arrest/cardiogenic shock -9 minutes prior to ROSC, 4 rounds of epinephrine -Was able to wean off pressors -Cardiology following  Lower GI bleed  Hypernatremia -Free water  Hypothyroidism History of thyroid cancer -On levothyroxine  Hematochezia -Continue monitor -Anticoagulation on hold -H&H stable -Poor candidate for invasive intervention  Nutrition -Cortrak with tube feeds  Scalp hematoma Rib fractures Sternum fracture -Supportive measures, pain management morning  Appears to be diuresing well with -3 L since hospitalization  No changes at present, will continue to follow X-ray pending, last chest x-ray was 3 /5 showing improving infiltrates  Best Practice (right click and "Reselect all SmartList Selections" daily)   Diet/type: tubefeeds DVT prophylaxis: prophylactic heparin  GI prophylaxis: PPI Lines: Central line Foley:  N/A Code Status:  full code Last date of multidisciplinary goals of care discussion   Labs   CBC: Recent Labs  Lab 01/20/23 0425 01/20/23 1848 01/20/23 2329 01/21/23 0309 01/21/23 1243 01/21/23 2034 01/22/23 0418 01/22/23 1019  WBC 10.4 12.7*  --  17.1* 14.1*  --  15.6*  --   HGB 10.7* 11.0*   < > 11.4* 10.2* 11.4* 11.4* 10.5*  HCT 32.0* 32.8*   < > 35.0*  32.3* 33.6* 33.4* 31.0*  MCV 110.3* 110.4*  --  111.1* 113.3*  --  109.5*  --   PLT 135* 165  --  203 190  --  223  --    < > = values in this interval not displayed.    Basic Metabolic Panel: Recent Labs  Lab 01/15/23 1637 01/16/23 0207 01/16/23 1023 01/18/23 1247 01/19/23 0523 01/19/23 1007 01/20/23 0425 01/20/23 2214 01/20/23 2329 01/21/23 0309 01/22/23 0418 01/22/23 1019  NA  --  146*   < > 146* 147*   < > 148* 147* 151* 150* 148*  150*  K  --  4.0   < > 3.9 4.0   < > 4.1 3.9 4.0 4.0 3.4* 3.6  CL  --  115*   < > 116* 118*  --  119* 117*  --  118* 115*  --   CO2  --  23   < > 23 22  --  26 22  --  22 23  --   GLUCOSE  --  129*   < > 163* 124*  --  91 146*  --  133* 123*  --   BUN  --  32*   < > 43* 32*  --  29* 38*  --  38* 36*  --   CREATININE  --  1.24   < > 1.25* 1.13  --  1.02 0.98  --  0.99 1.02  --   CALCIUM  --  9.7   < > 9.6 9.0  --  10.1 11.0*  --  10.7* 9.9  --   MG 2.1 2.3  --   --  2.4  --   --  2.7*  --   --   --   --   PHOS 2.7 2.4*   < > 1.6* 3.7  --   --  2.7  --  2.0* 1.8*  --    < > = values in this interval not displayed.   GFR: Estimated Creatinine Clearance: 41.5 mL/min (by C-G formula based on SCr of 1.02 mg/dL). Recent Labs  Lab 01/17/23 0945 01/18/23 0522 01/20/23 1848 01/21/23 0309 01/21/23 1243 01/22/23 0418  WBC  --    < > 12.7* 17.1* 14.1* 15.6*  LATICACIDVEN 1.7  --   --   --   --   --    < > = values in this interval not displayed.    Liver Function Tests: Recent Labs  Lab 01/16/23 0207 01/17/23 0318 01/18/23 0522 01/20/23 2214  AST '29 24 17 '$ 37  ALT 81* 62* 46* 47*  ALKPHOS 71 71 81 110  BILITOT 1.5* 1.4* 1.3* 1.6*  PROT 5.8* 5.5* 5.8* 7.1  ALBUMIN 2.6* 2.5* 2.4* 2.6*   No results for input(s): "LIPASE", "AMYLASE" in the last 168 hours. No results for input(s): "AMMONIA" in the last 168 hours.  ABG    Component Value Date/Time   PHART 7.452 (H) 01/22/2023 1019   PCO2ART 36.1 01/22/2023 1019   PO2ART 68 (L) 01/22/2023 1019   HCO3 24.9 01/22/2023 1019   TCO2 26 01/22/2023 1019   ACIDBASEDEF 2.0 01/20/2023 2329   O2SAT 93 01/22/2023 1019     Coagulation Profile: No results for input(s): "INR", "PROTIME" in the last 168 hours.  Cardiac Enzymes: No results for input(s): "CKTOTAL", "CKMB", "CKMBINDEX", "TROPONINI" in the last 168 hours.  HbA1C: Hgb A1c MFr Bld  Date/Time Value Ref Range Status  01/13/2023 12:23 PM 5.9 (H) 4.8 - 5.6 %  Final    Comment:     (NOTE)         Prediabetes: 5.7 - 6.4         Diabetes: >6.4         Glycemic control for adults with diabetes: <7.0     CBG: Recent Labs  Lab 01/21/23 1513 01/21/23 1941 01/21/23 2320 01/22/23 0325 01/22/23 0719  GLUCAP 143* 115* 126* 120* 117*    Past Medical History:  He,  has a past medical history of Acute on chronic combined systolic and diastolic CHF (congestive heart failure) ([place]) (03/19/2019), AKI (acute kidney injury) ([place]) (12/26/2014), Arthralgia (11/04/2014), Arthritis, Atypical mycobacterium infection, Cancer ([place]), Chronic systolic heart failure ([place]), Decreased oral intake (12/26/2014), Decreased oral intake (12/26/2014), Depressed left ventricular ejection fraction (02/05/2020), Dilated cardiomyopathy ([place]) (03/19/2019), DJD (degenerative joint disease) (10/07/2014), Dyspnea on exertion (03/19/2019), Essential hypertension (02/05/2020), GERD (gastroesophageal reflux disease), Hammer toes of both feet (01/30/2018), History of thyroid cancer (12/16/2016), Hypercalcemia (12/26/2014), Hypothyroidism, Hypothyroidism associated with surgical procedure (10/07/2014), Lung mass, Myalgia (11/04/2014), Mycobacterium avium complex ([place]), Nail dystrophy (01/30/2018), Occult malignancy ([place]), PAF (paroxysmal atrial fibrillation) ([place]) (02/05/2020), Persistent atrial fibrillation ([place]) (03/19/2019), PONV (postoperative nausea and vomiting), Pre-ulcerative calluses (01/30/2018), Pseudoaneurysm following procedure ([place]) (04/16/2020), Sensorineural hearing loss (SNHL), bilateral (08/11/2018), Shingles, Toenail fungus (01/30/2018), and Wears glasses.   Surgical History:   Past Surgical History:  Procedure Laterality Date   APPENDECTOMY     CATARACT EXTRACTION W/ INTRAOCULAR LENS  IMPLANT, BILATERAL Bilateral    COLONOSCOPY     INGUINAL HERNIA REPAIR Left    LEFT HEART CATH AND CORONARY ANGIOGRAPHY N/A 03/11/2020   Procedure: LEFT HEART CATH AND CORONARY ANGIOGRAPHY;  Surgeon: Jettie Booze, MD;  Location:  [place] [facility];  Service: Cardiovascular;  Laterality: N/A;   MASS EXCISION Right 08/27/2014   Procedure: EXCISION MASS RIGHT PALM/INDEX;  Surgeon: Leanora Cover, MD;  Location: [place];  Service: Orthopedics;  Laterality: Right;   THYROIDECTOMY  2003     Social History:   reports that he has never smoked. He has never used smokeless tobacco. He reports that he does not drink alcohol and does not use drugs.   Family History:  His family history is not on file.   Allergies No Known Allergies   The patient is critically ill with multiple organ systems failure and requires high complexity decision making for assessment and support, frequent evaluation and titration of therapies, application of advanced monitoring technologies and extensive interpretation of multiple databases. Critical Care Time devoted to patient care services described in this note independent of APP/resident time (if applicable)  is 33 minutes.   Sherrilyn Rist MD [facility] Personal pager: See Amion If unanswered, please page CCM On-call: [phone]

## 2023-01-22 NOTE — Progress Notes (Signed)
ANTICOAGULATION CONSULT NOTE   Pharmacy Consult for IV heparin Indication: atrial fibrillation  No Known Allergies  Patient Measurements: Height: '5\' 11"'$  (180.3 cm) Weight: 58.6 kg (129 lb 3 oz) IBW/kg (Calculated) : 75.3 Heparin Dosing Weight: 58.7 kg (TBW)   Vital Signs: Temp: 99.5 F (37.5 C) (03/09 1500) Temp Source: Oral (03/09 0722) BP: 106/58 (03/09 1500) Pulse Rate: 60 (03/09 1500)  Labs: Recent Labs    01/20/23 1848 01/20/23 2214 01/20/23 2329 01/21/23 0309 01/21/23 1243 01/21/23 2034 01/22/23 0418 01/22/23 1019 01/22/23 1620  HGB 11.0*  --    < > 11.4* 10.2* 11.4* 11.4* 10.5*  --   HCT 32.8*  --    < > 35.0* 32.3* 33.6* 33.4* 31.0*  --   PLT 165  --   --  203 190  --  223  --   --   APTT 61*  --   --  64*  --   --  57*  --   --   HEPARINUNFRC  --   --   --  1.08*  --   --  0.88*  --  0.86*  CREATININE  --  0.98  --  0.99  --   --  1.02  --   --    < > = values in this interval not displayed.     Estimated Creatinine Clearance: 41.5 mL/min (by C-G formula based on SCr of 1.02 mg/dL).   Medical History: Past Medical History:  Diagnosis Date   Acute on chronic combined systolic and diastolic CHF (congestive heart failure) ([place]) 03/19/2019   AKI (acute kidney injury) ([place]) 12/26/2014   Arthralgia 11/04/2014   Arthritis    "proabably"   Atypical mycobacterium infection    Cancer [facility])    family unaware of this hx on [id number]   Chronic systolic heart failure (HCC)    Decreased oral intake 12/26/2014   Decreased oral intake 12/26/2014   Depressed left ventricular ejection fraction 02/05/2020   Dilated cardiomyopathy ([place]) 03/19/2019   Ejection fraction 20 to 25% based on echocardiogram done by pulmonologist month ago.   DJD (degenerative joint disease) 10/07/2014   Dyspnea on exertion 03/19/2019   Essential hypertension 02/05/2020   GERD (gastroesophageal reflux disease)    Hammer toes of both feet 01/30/2018   History of thyroid cancer 12/16/2016    Hypercalcemia 12/26/2014   Hypothyroidism    Hypothyroidism associated with surgical procedure 10/07/2014   Lung mass    Myalgia 11/04/2014   Mycobacterium avium complex ([place])    Nail dystrophy 01/30/2018   Occult malignancy (HCC)    PAF (paroxysmal atrial fibrillation) ([place]) 02/05/2020   Persistent atrial fibrillation ([place]) 03/19/2019   Chads 2 Vascor equals 3   PONV (postoperative nausea and vomiting)    Pre-ulcerative calluses 01/30/2018   Pseudoaneurysm following procedure ([place]) 04/16/2020   Sensorineural hearing loss (SNHL), bilateral 08/11/2018   Shingles    Toenail fungus 01/30/2018   Wears glasses     Assessment: 5 YOM with hx afib, on Eliquis PTA, last dose 2/29 PTA. On subcutaneous heparin for VTE prophylaxis. Pt with R-scalp hematoma on 2/29, CT head negative for intracranial hemorrhage.Pharmacy consulted to start IV heparin while Eliquis is on hold. Discussed with HF team, will start heparin with no bolus and slowly titrate to lower heparin level/aPTT goal.   Heparin restarted after being held yesterday for hematochezia. Heparin level remains elevated at 0.86, aPTT came back therapeutic at 68, on heparin infusion at 1200 units/hr. Hgb 10.5,  plt 223. No s/sx of bleeding or infusion issues.   Goal of Therapy:  Heparin level 0.3-0.5 units/ml aPTT 66-85 seconds Monitor platelets by anticoagulation protocol: Yes   Plan:  Continue heparin at 1200 units/hr Will recheck levels with AM labs Monitor daily HL/aPTT until correlate, CBC, and for s/sx of bleeding   Antonietta Jewel, PharmD, [place] Pharmacist  Phone: [phone] 01/22/2023 4:47 PM  Please check AMION for all [place] phone numbers After 10:00 PM, call [place] [phone]

## 2023-01-22 NOTE — Progress Notes (Signed)
Pt on HFNC and lethegic and RR in th high 40s. RT @ bedside to switch over to BIPAP and MD en route to bedside.

## 2023-01-22 NOTE — Progress Notes (Signed)
ANTICOAGULATION CONSULT NOTE   Pharmacy Consult for IV heparin Indication: atrial fibrillation  No Known Allergies  Patient Measurements: Height: '5\' 11"'$  (180.3 cm) Weight: 58.6 kg (129 lb 3 oz) IBW/kg (Calculated) : 75.3 Heparin Dosing Weight: 58.7 kg (TBW)   Vital Signs: Temp: 100.6 F (38.1 C) (03/09 0356) Temp Source: Oral (03/09 0356) BP: 135/80 (03/09 0100) Pulse Rate: 89 (03/09 0235)  Labs: Recent Labs    01/20/23 0425 01/20/23 1848 01/20/23 2214 01/20/23 2329 01/21/23 0309 01/21/23 1243 01/21/23 2034 01/22/23 0418  HGB 10.7* 11.0*  --    < > 11.4* 10.2* 11.4* 11.4*  HCT 32.0* 32.8*  --    < > 35.0* 32.3* 33.6* 33.4*  PLT 135* 165  --   --  203 190  --  223  APTT 74* 61*  --   --  64*  --   --  57*  HEPARINUNFRC >1.10*  --   --   --  1.08*  --   --  0.88*  CREATININE 1.02  --  0.98  --  0.99  --   --  1.02   < > = values in this interval not displayed.     Estimated Creatinine Clearance: 41.5 mL/min (by C-G formula based on SCr of 1.02 mg/dL).   Medical History: Past Medical History:  Diagnosis Date   Acute on chronic combined systolic and diastolic CHF (congestive heart failure) ([place]) 03/19/2019   AKI (acute kidney injury) ([place]) 12/26/2014   Arthralgia 11/04/2014   Arthritis    "proabably"   Atypical mycobacterium infection    Cancer [facility])    family unaware of this hx on [id number]   Chronic systolic heart failure (HCC)    Decreased oral intake 12/26/2014   Decreased oral intake 12/26/2014   Depressed left ventricular ejection fraction 02/05/2020   Dilated cardiomyopathy ([place]) 03/19/2019   Ejection fraction 20 to 25% based on echocardiogram done by pulmonologist month ago.   DJD (degenerative joint disease) 10/07/2014   Dyspnea on exertion 03/19/2019   Essential hypertension 02/05/2020   GERD (gastroesophageal reflux disease)    Hammer toes of both feet 01/30/2018   History of thyroid cancer 12/16/2016   Hypercalcemia 12/26/2014   Hypothyroidism     Hypothyroidism associated with surgical procedure 10/07/2014   Lung mass    Myalgia 11/04/2014   Mycobacterium avium complex ([place])    Nail dystrophy 01/30/2018   Occult malignancy (HCC)    PAF (paroxysmal atrial fibrillation) ([place]) 02/05/2020   Persistent atrial fibrillation ([place]) 03/19/2019   Chads 2 Vascor equals 3   PONV (postoperative nausea and vomiting)    Pre-ulcerative calluses 01/30/2018   Pseudoaneurysm following procedure ([place]) 04/16/2020   Sensorineural hearing loss (SNHL), bilateral 08/11/2018   Shingles    Toenail fungus 01/30/2018   Wears glasses     Assessment: 14 YOM with hx afib, on Eliquis PTA, last dose 2/29 PTA. On subcutaneous heparin for VTE prophylaxis. Pt with R-scalp hematoma on 2/29, CT head negative for intracranial hemorrhage.Pharmacy consulted to start IV heparin while Eliquis is on hold. Discussed with HF team, will start heparin with no bolus and slowly titrate to lower heparin level/aPTT goal.   Heparin held today for hematochezia. Per GI, continue to monitor given poor candidate for procedure. Ok to restart heparin per MD. Last aPTT was 64, just subtherapeutic on 1100 units/hr. Will increase slightly, MD aware.   3/9 AM update:  aPTT low  Goal of Therapy:  Heparin level 0.3-0.5 units/ml aPTT 66-85  seconds Monitor platelets by anticoagulation protocol: Yes   Plan:  Inc heparin to 1200 units/hr 1400 aPTT/heparin level  Narda Bonds, PharmD, [place] Pharmacist Phone: [phone]

## 2023-01-23 DIAGNOSIS — I469 Cardiac arrest, cause unspecified: Secondary | ICD-10-CM | POA: Diagnosis not present

## 2023-01-23 DIAGNOSIS — J9601 Acute respiratory failure with hypoxia: Secondary | ICD-10-CM | POA: Diagnosis not present

## 2023-01-23 DIAGNOSIS — A419 Sepsis, unspecified organism: Secondary | ICD-10-CM | POA: Diagnosis not present

## 2023-01-23 DIAGNOSIS — E43 Unspecified severe protein-calorie malnutrition: Secondary | ICD-10-CM | POA: Diagnosis not present

## 2023-01-23 LAB — CBC
HCT: 32.4 % — ABNORMAL LOW (ref 39.0–52.0)
Hemoglobin: 10.4 g/dL — ABNORMAL LOW (ref 13.0–17.0)
MCH: 36 pg — ABNORMAL HIGH (ref 26.0–34.0)
MCHC: 32.1 g/dL (ref 30.0–36.0)
MCV: 112.1 fL — ABNORMAL HIGH (ref 80.0–100.0)
Platelets: 230 10*3/uL (ref 150–400)
RBC: 2.89 MIL/uL — ABNORMAL LOW (ref 4.22–5.81)
RDW: 14.6 % (ref 11.5–15.5)
WBC: 12.9 10*3/uL — ABNORMAL HIGH (ref 4.0–10.5)
nRBC: 0 % (ref 0.0–0.2)

## 2023-01-23 LAB — COMPREHENSIVE METABOLIC PANEL
ALT: 72 U/L — ABNORMAL HIGH (ref 0–44)
AST: 58 U/L — ABNORMAL HIGH (ref 15–41)
Albumin: 2.1 g/dL — ABNORMAL LOW (ref 3.5–5.0)
Alkaline Phosphatase: 104 U/L (ref 38–126)
Anion gap: 6 (ref 5–15)
BUN: 36 mg/dL — ABNORMAL HIGH (ref 8–23)
CO2: 24 mmol/L (ref 22–32)
Calcium: 9.5 mg/dL (ref 8.9–10.3)
Chloride: 117 mmol/L — ABNORMAL HIGH (ref 98–111)
Creatinine, Ser: 0.97 mg/dL (ref 0.61–1.24)
GFR, Estimated: >60 mL/min (ref >60)
Glucose, Bld: 116 mg/dL — ABNORMAL HIGH (ref 70–99)
Potassium: 3.7 mmol/L (ref 3.5–5.1)
Sodium: 147 mmol/L — ABNORMAL HIGH (ref 135–145)
Total Bilirubin: 0.9 mg/dL (ref 0.3–1.2)
Total Protein: 6 g/dL — ABNORMAL LOW (ref 6.5–8.1)

## 2023-01-23 LAB — GLUCOSE, CAPILLARY
Glucose-Capillary: 102 mg/dL — ABNORMAL HIGH (ref 70–99)
Glucose-Capillary: 104 mg/dL — ABNORMAL HIGH (ref 70–99)
Glucose-Capillary: 104 mg/dL — ABNORMAL HIGH (ref 70–99)
Glucose-Capillary: 109 mg/dL — ABNORMAL HIGH (ref 70–99)
Glucose-Capillary: 112 mg/dL — ABNORMAL HIGH (ref 70–99)
Glucose-Capillary: 121 mg/dL — ABNORMAL HIGH (ref 70–99)

## 2023-01-23 LAB — MAGNESIUM: Magnesium: 2.5 mg/dL — ABNORMAL HIGH (ref 1.7–2.4)

## 2023-01-23 LAB — APTT
aPTT: 61 seconds — ABNORMAL HIGH (ref 24–36)
aPTT: 76 seconds — ABNORMAL HIGH (ref 24–36)

## 2023-01-23 LAB — HEPARIN LEVEL (UNFRACTIONATED)
Heparin Unfractionated: 0.76 IU/mL — ABNORMAL HIGH (ref 0.30–0.70)
Heparin Unfractionated: 0.74 IU/mL — ABNORMAL HIGH (ref 0.30–0.70)

## 2023-01-23 LAB — PHOSPHORUS: Phosphorus: 2.6 mg/dL (ref 2.5–4.6)

## 2023-01-23 MED ORDER — POTASSIUM PHOSPHATES 15 MMOLE/5ML IV SOLN
15.0000 mmol | Freq: Once | INTRAVENOUS | Status: AC
  Administered 2023-01-23: 15 mmol via INTRAVENOUS
  Filled 2023-01-23: qty 5

## 2023-01-23 MED ORDER — SENNOSIDES-DOCUSATE SODIUM 8.6-50 MG PO TABS
2.0000 | ORAL_TABLET | Freq: Every day | ORAL | Status: DC
  Filled 2023-01-23: qty 2

## 2023-01-23 NOTE — Progress Notes (Signed)
Spoke with patient's brother and other family members just outside the patient's room  Discussed CODE STATUS  Possibility of decompensation that may lead to requiring being on the vent again which will not be a good idea as he will not be able to be liberated off the vent  I did encourage them to definitely think about his CODE STATUS as CPR will not likely be successful and he already has multiple broken ribs with severe pain and discomfort  Continue current treatment at present

## 2023-01-23 NOTE — Progress Notes (Addendum)
ANTICOAGULATION CONSULT NOTE   Pharmacy Consult for IV heparin Indication: atrial fibrillation  No Known Allergies  Patient Measurements: Height: 5\' 11"  (180.3 cm) Weight: 57.9 kg (127 lb 10.3 oz) IBW/kg (Calculated) : 75.3 Heparin Dosing Weight: 58.7 kg (TBW)   Vital Signs: Temp: 99.7 F (37.6 C) (03/10 0800) Temp Source: Bladder (03/10 0754) BP: 111/66 (03/10 0800) Pulse Rate: 74 (03/10 0800)  Labs: Recent Labs    01/21/23 0309 01/21/23 1243 01/21/23 2034 01/22/23 0418 01/22/23 1019 01/22/23 1620 01/23/23 0120  HGB 11.4* 10.2*   < > 11.4* 10.5*  --  10.4*  HCT 35.0* 32.3*   < > 33.4* 31.0*  --  32.4*  PLT 203 190  --  223  --   --  230  APTT 64*  --   --  57*  --  68* 61*  HEPARINUNFRC 1.08*  --   --  0.88*  --  0.86* 0.76*  CREATININE 0.99  --   --  1.02  --   --  0.97   < > = values in this interval not displayed.     Estimated Creatinine Clearance: 43.1 mL/min (by C-G formula based on SCr of 0.97 mg/dL).   Medical History: Past Medical History:  Diagnosis Date   Acute on chronic combined systolic and diastolic CHF (congestive heart failure) ([place]) 03/19/2019   AKI (acute kidney injury) ([place]) 12/26/2014   Arthralgia 11/04/2014   Arthritis    "proabably"   Atypical mycobacterium infection    Cancer [facility])    family unaware of this hx on 05/08/7627   Chronic systolic heart failure (HCC)    Decreased oral intake 12/26/2014   Decreased oral intake 12/26/2014   Depressed left ventricular ejection fraction 02/05/2020   Dilated cardiomyopathy ([place]) 03/19/2019   Ejection fraction 20 to 25% based on echocardiogram done by pulmonologist month ago.   DJD (degenerative joint disease) 10/07/2014   Dyspnea on exertion 03/19/2019   Essential hypertension 02/05/2020   GERD (gastroesophageal reflux disease)    Hammer toes of both feet 01/30/2018   History of thyroid cancer 12/16/2016   Hypercalcemia 12/26/2014   Hypothyroidism    Hypothyroidism associated with surgical procedure  10/07/2014   Lung mass    Myalgia 11/04/2014   Mycobacterium avium complex ([place])    Nail dystrophy 01/30/2018   Occult malignancy (HCC)    PAF (paroxysmal atrial fibrillation) ([place]) 02/05/2020   Persistent atrial fibrillation ([place]) 03/19/2019   Chads 2 Vascor equals 3   PONV (postoperative nausea and vomiting)    Pre-ulcerative calluses 01/30/2018   Pseudoaneurysm following procedure ([place]) 04/16/2020   Sensorineural hearing loss (SNHL), bilateral 08/11/2018   Shingles    Toenail fungus 01/30/2018   Wears glasses     Assessment: 64 YOM with hx afib, on Eliquis PTA, last dose 2/29 PTA. Pt with R-scalp hematoma on 2/29, CT head negative for intracranial hemorrhage. Pharmacy consulted to start IV heparin while Eliquis is on hold.   Monitoring aPTT until correlating with heparin levels.  aPTT 61 (subtherapeutic) and heparin level 0.76 units/mL (supratherapeutic-Eliquis related) on heparin 1200 units/hr. No signs of bleeding.  Goal of Therapy:  Heparin level 0.3-0.5 units/ml aPTT 66-85 seconds Monitor platelets by anticoagulation protocol: Yes   Plan:  Increase heparin to 1300 units/hr Check aPTT and heparin level at 1700 Daily CBC and monitor for bleeding  Erskine Speed, PharmD Clinical Pharmacist 01/23/2023 8:34 AM  Please check AMION for all [place] phone numbers After 10:00 PM, call [place]  [phone]    

## 2023-01-23 NOTE — Progress Notes (Signed)
[facility]                                                                               Curtis Ramos, is a 89 y.o. male, DOB - Nov 09, 1932, [id number] Admit date - 01/13/2023    Outpatient Primary MD for the patient is [name], [name], MD  LOS - 10  days    Brief summary   89 year old man with prior history of hypertension nonischemic cardiomyopathy and chronic systolic heart failure with left ventricle ejection fraction of 17%, atrial fibrillation on Eliquis, hypothyroidism, history of thyroid cancer, GERD presented to [name] ED on 2/29 after unwitnessed fall at work.  As per the PCCM notes patient was found down between the radiator and the wall unresponsive with contusion.  On arrival to ED patient became apneic and lost pulses and cardiac monitoring showed PEA arrest.  CPR administered for 9 minutes with epi x 4 and ROSC achieved.  Patient was intubated.  Arrest and cardiology was consulted. CT of the chest abdomen and pelvis showed known displaced transverse fractures of the mid lower sternal body, acute fracture of the coastal cartilage of bilateral second through sixth ribs, acute fracture of the anterior second to 7 ribs.  He was admitted to [facility] service.  He was started on Unasyn for aspiration pneumonitis, later transitioned to cefepime for Pseudomonas VAP. He was extubated on 01/19/2023, and transferred to [facility] on 01/21/2023. ON 3/9 Patient became tachypneic and requiried BIPAP for 24 hours, transitioned to HF oxygen.    Assessment & Plan    Assessment and Plan:   PEA arrest/ Cardiogenic shock:  Weaned off vasopressors.  Cardiology on board and managing heart failure.    Acute hypoxic respiratory failure secondary to pneumonia and pulmonary edema in the setting of sternal fractures and aspiration pneumonia Extubated on 01/20/2023 He is doing poorly, he was on 40 lit of HF oxygen at 30% fio2.  Re consulted PCCM yesterday as he became tachypneic, required  BIPAP for 24 hours. but he looks much better today weaned off BIPAP and more aware and alert  and talking to his son at bedside.   Septic shock secondary to pneumonia//COVID/ pseudomonas VAP Lactic acidosis resolved On IV antibiotics cefepime.  Respiratory cultures show pseudomonas.  Fevers in the last 24 hours, blood cultures done and pending.    NICM/ chronic systolic heart failure:  Cardiology on board     Chronic atrial fibrillation with frequent PVC's - rate high between 90 to 100/min with PVC's On IV heparin for anti coagulation.    BRBPR  No external hemorrhoids evident. None later on.  Repeat hemoglobin stable around 11.  GI consulted,not a candidate for any intervention at this time.     Hypothyroidism:  Int he setting of h.o thyroid cancer Recommend checking thyroid panel in 2 to 4 weeks when acute illness improves.  Resume levothyroxine.    Hypernatremia Free water boluses, sodium improved from 150 to 148 to 147.     AKI due to shock:  Resolved.    Hypophosphatemia:  Replaced.    Hypercalcemia:  Resolved.    Macrocytic anemia:    Nutrition:  On cortrak with  tube feeds. Which were held for respiratory distress.    Urinary retention:  S/p foley catheter placement on 01/21/23.     RN Pressure Injury Documentation:    Malnutrition Type:  Nutrition Problem: Severe Malnutrition Etiology: chronic illness (CHF)   Malnutrition Characteristics:  Signs/Symptoms: severe fat depletion, severe muscle depletion   Nutrition Interventions:  Interventions: Refer to RD note for recommendations  Estimated body mass index is 17.8 kg/m as calculated from the following:   Height as of this encounter: '5\' 11"'$  (1.803 m).   Weight as of this encounter: 57.9 kg.  Code Status: full code.  DVT Prophylaxis:  SCDs Start: 01/13/23 1327   Level of Care: Level of care: Progressive Family Communication: family at bedside.   Disposition Plan:      Remains inpatient appropriate:  still on high flow oxygen.     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 Found between radiator and wall with R-sided head injury. Suspected cardiac etiology of unresponsiveness. On ED arrival, became pulseless (rhythm PEA). CPR x 9 min, Epi x 4 with ROSC. Intubated peri-arrest. CT Head, C-Spine, Chest/A/P completed (as above). Unasyn for ?aspiration. R axillary A-line, L subclavian CVC, ETT tube exchange + bronch.  3/3 - Extubated, back into respiratory failure, reintubated 3/3 - OG tube placement complications, completely replaced, still trouble with placement 3/4 - Started cefepime for respiratory culture showing abundant pseudomonas 3/7 extubated.  3/8 - Transferred to [facility] on 3/8 3/8 - GI consulted for BRBPR.     Consultants:   Cardiology PCCM Gastroenterology.  Antimicrobials:   Anti-infectives (From admission, onward)    Start     Dose/Rate Route Frequency Ordered Stop   01/17/23 1100  ceFEPIme (MAXIPIME) 2 g in sodium chloride 0.9 % 100 mL IVPB        2 g 200 mL/hr over 30 Minutes Intravenous Every 12 hours 01/17/23 1040 01/26/23 2359   01/16/23 1400  cefTRIAXone (ROCEPHIN) 2 g in sodium chloride 0.9 % 100 mL IVPB  Status:  Discontinued        2 g 200 mL/hr over 30 Minutes Intravenous Every 24 hours 01/16/23 1136 01/17/23 1028   01/15/23 0815  Ampicillin-Sulbactam (UNASYN) 3 g in sodium chloride 0.9 % 100 mL IVPB  Status:  Discontinued        3 g 200 mL/hr over 30 Minutes Intravenous Every 6 hours 01/15/23 0804 01/16/23 1136   01/13/23 1345  Ampicillin-Sulbactam (UNASYN) 3 g in sodium chloride 0.9 % 100 mL IVPB  Status:  Discontinued        3 g 200 mL/hr over 30 Minutes Intravenous Every 12 hours 01/13/23 1339 01/15/23 0804        Medications  Scheduled Meds:  amiodarone  200 mg Per Tube Daily   Chlorhexidine Gluconate Cloth  6 each Topical Daily    feeding supplement (PROSource TF20)  60 mL Per Tube Daily   free water  200 mL Per Tube Q4H   levothyroxine  175 mcg Per Tube Daily   lidocaine  1 patch Transdermal Q24H   mouth rinse  15 mL Mouth Rinse 4 times per day   pantoprazole (PROTONIX) IV  40 mg Intravenous Q24H   senna-docusate  2 tablet Per Tube Daily   Continuous Infusions:  sodium chloride Stopped (01/23/23 1057)   ceFEPime (MAXIPIME) IV 200 mL/hr at 01/23/23 1100   feeding supplement (OSMOLITE 1.5 CAL) 1,000 mL (01/23/23 1324)  heparin 1,300 Units/hr (01/23/23 1100)   potassium PHOSPHATE IVPB (in mmol) 43 mL/hr at 01/23/23 1100   PRN Meds:.acetaminophen, docusate, HYDROmorphone (DILAUDID) injection, lip balm, ondansetron (ZOFRAN) IV, mouth rinse, polyethylene glycol    Subjective:   Curtis Ramos was seen and examined today.  More awake today and communicating.  On HF oxygen.   Objective:   Vitals:   01/23/23 0900 01/23/23 1000 01/23/23 1100 01/23/23 1122  BP: 119/63 121/62 126/66   Pulse: 69 81 81   Resp: (!) 25 (!) 23 (!) 29   Temp: 99.7 F (37.6 C) 99.9 F (37.7 C) 100 F (37.8 C) 100.2 F (37.9 C)  TempSrc:    Bladder  SpO2: 100% 100% 100%   Weight:      Height:        Intake/Output Summary (Last 24 hours) at 01/23/2023 1407 Last data filed at 01/23/2023 1100 Gross per 24 hour  Intake 1653.32 ml  Output 1850 ml  Net -196.68 ml    Filed Weights   01/21/23 0500 01/22/23 0500 01/23/23 0500  Weight: 60.9 kg 58.6 kg 57.9 kg     Exam General exam: ill appearing gentleman, not in distress. On HF [place] oxygen  Respiratory system: bilateral rhonchi. Tachypnea present on HF [place] oxygen.  Cardiovascular system: S1 & S2 heard, irregularly irregular. Marland Kitchen No JVD,  Gastrointestinal system: Abdomen is nondistended, soft and nontender. Central nervous system: Alert , talking with son at bedside.  Extremities: no pedal edema.  Skin: No rashes, Psychiatry: unable to assess.      Data Reviewed:  I have  personally reviewed following labs and imaging studies   CBC Lab Results  Component Value Date   WBC 12.9 (H) 01/23/2023   RBC 2.89 (L) 01/23/2023   HGB 10.4 (L) 01/23/2023   HCT 32.4 (L) 01/23/2023   MCV 112.1 (H) 01/23/2023   MCH 36.0 (H) 01/23/2023   PLT 230 01/23/2023   MCHC 32.1 01/23/2023   RDW 14.6 123456     Last metabolic panel Lab Results  Component Value Date   NA 147 (H) 01/23/2023   K 3.7 01/23/2023   CL 117 (H) 01/23/2023   CO2 24 01/23/2023   BUN 36 (H) 01/23/2023   CREATININE 0.97 01/23/2023   GLUCOSE 116 (H) 01/23/2023   GFRNONAA >60 01/23/2023   GFRAA 76 10/15/2020   CALCIUM 9.5 01/23/2023   PHOS 2.6 01/23/2023   PROT 6.0 (L) 01/23/2023   ALBUMIN 2.1 (L) 01/23/2023   LABGLOB 2.6 04/16/2020   AGRATIO 1.7 04/16/2020   BILITOT 0.9 01/23/2023   ALKPHOS 104 01/23/2023   AST 58 (H) 01/23/2023   ALT 72 (H) 01/23/2023   ANIONGAP 6 01/23/2023    CBG (last 3)  Recent Labs    01/23/23 0329 01/23/23 0752 01/23/23 1121  GLUCAP 104* 104* 109*       Coagulation Profile: No results for input(s): "INR", "PROTIME" in the last 168 hours.   Radiology Studies: DG CHEST PORT 1 VIEW  Result Date: 01/22/2023 CLINICAL DATA:  Pneumonia EXAM: PORTABLE CHEST 1 VIEW COMPARISON:  01/18/2023 FINDINGS: Interval extubation. Enteric tube is again seen with distal tip beyond the inferior margin of the film. Stable heart size. Progressive patchy bilateral airspace opacities most pronounced in the right upper lobe and left lung base. Probable small left pleural effusion. No pneumothorax. IMPRESSION: Progressive patchy bilateral airspace opacities most pronounced in the right upper lobe and left lung base. Electronically Signed   By: Davina Poke D.O.  On: 01/22/2023 11:20       Hosie Poisson M.D. [facility] 01/23/2023, 2:07 PM  Available via Epic secure chat 7am-7pm After 7 pm, please refer to night coverage provider listed on amion.

## 2023-01-23 NOTE — Progress Notes (Signed)
ANTICOAGULATION CONSULT NOTE   Pharmacy Consult for IV heparin Indication: atrial fibrillation  No Known Allergies  Patient Measurements: Height: '5\' 11"'$  (180.3 cm) Weight: Curtis.9 kg (127 lb 10.3 oz) IBW/kg (Calculated) : 75.3 Heparin Dosing Weight: 58.7 kg (TBW)   Vital Signs: Temp: 99.3 F (37.4 C) (03/10 1900) Temp Source: Bladder (03/10 1526) BP: 124/61 (03/10 1900) Pulse Rate: 78 (03/10 1900)  Labs: Recent Labs    01/21/23 0309 01/21/23 1243 01/21/23 2034 01/22/23 0418 01/22/23 1019 01/22/23 1620 01/23/23 0120 01/23/23 1810  HGB 11.4* 10.2*   < > 11.4* 10.5*  --  10.4*  --   HCT 35.0* 32.3*   < > 33.4* 31.0*  --  32.4*  --   PLT 203 190  --  223  --   --  230  --   APTT 64*  --   --  Curtis*  --  68* 61* 76*  HEPARINUNFRC 1.08*  --   --  0.88*  --  0.86* 0.76* 0.74*  CREATININE 0.99  --   --  1.02  --   --  0.97  --    < > = values in this interval not displayed.     Estimated Creatinine Clearance: 43.1 mL/min (by C-G formula based on SCr of 0.97 mg/dL).   Medical History: Past Medical History:  Diagnosis Date   Acute on chronic combined systolic and diastolic CHF (congestive heart failure) ([place]) 03/19/2019   AKI (acute kidney injury) ([place]) 12/26/2014   Arthralgia 11/04/2014   Arthritis    "proabably"   Atypical mycobacterium infection    Cancer [facility])    family unaware of this hx on [id number]   Chronic systolic heart failure (HCC)    Decreased oral intake 12/26/2014   Decreased oral intake 12/26/2014   Depressed left ventricular ejection fraction 02/05/2020   Dilated cardiomyopathy ([place]) 03/19/2019   Ejection fraction 20 to 25% based on echocardiogram done by pulmonologist month ago.   DJD (degenerative joint disease) 10/07/2014   Dyspnea on exertion 03/19/2019   Essential hypertension 02/05/2020   GERD (gastroesophageal reflux disease)    Hammer toes of both feet 01/30/2018   History of thyroid cancer 12/16/2016   Hypercalcemia 12/26/2014   Hypothyroidism     Hypothyroidism associated with surgical procedure 10/07/2014   Lung mass    Myalgia 11/04/2014   Mycobacterium avium complex ([place])    Nail dystrophy 01/30/2018   Occult malignancy (HCC)    PAF (paroxysmal atrial fibrillation) ([place]) 02/05/2020   Persistent atrial fibrillation ([place]) 03/19/2019   Chads 2 Vascor equals 3   PONV (postoperative nausea and vomiting)    Pre-ulcerative calluses 01/30/2018   Pseudoaneurysm following procedure ([place]) 04/16/2020   Sensorineural hearing loss (SNHL), bilateral 08/11/2018   Shingles    Toenail fungus 01/30/2018   Wears glasses     Assessment: Curtis Ramos with hx afib, on Eliquis PTA, last dose 2/29 PTA. Pt with R-scalp hematoma on 2/29, CT head negative for intracranial hemorrhage. Pharmacy consulted to start IV heparin while Eliquis is on hold.   Monitoring aPTT until correlating with heparin levels.  aPTT 76 (therapeutic) and heparin level 0.74 units/mL (supratherapeutic-Eliquis related) on heparin 1300 units/hr. No s/sx of bleeding or infusion issues per nursing.  Goal of Therapy:  Heparin level 0.3-0.5 units/ml aPTT 66-85 seconds Monitor platelets by anticoagulation protocol: Yes   Plan:  Continue heparin infusion at 1300 units/hr Check aPTT and heparin level with AM labs Daily CBC and monitor for bleeding  Joelene Millin  Rogene Houston, PharmD, [place] Clinical Pharmacist  Phone: [phone] 01/23/2023 7:29 PM  Please check AMION for all [place] phone numbers After 10:00 PM, call [place] [phone]

## 2023-01-23 NOTE — Progress Notes (Signed)
NAME:  Curtis Ramos, MRN:  [id number], DOB:  1932/03/29, LOS: 50 ADMISSION DATE:  01/13/2023, CONSULTATION DATE:  01/13/2023 REFERRING MD:  Trauma, CHIEF COMPLAINT:  Cardiac arrest, post-fall (Level 1 Trauma)   History of Present Illness:  89 year old man who presented to [facility] ED 2/29 via EMS after unwitnessed fall at work. PMHx significant for HTN, HFrEF with NICM (cMRI 12/2022 with severe LV dilation, EF 17%, LBBB, followed by Dr. Haroldine Laws), AFib (on Eliquis), MAI infection, GERD, hypothyroidism, history of thyroid CA.   History obtained from chart review and from family. Patient presented to [facility] ED via EMS after a fall at work; per family, patient did not have any complaints prior to fall. Recently undergoing workup with Cardiology for pacemaker placement. He was found down between a radiator and the wall, unresponsive with contusion and deformities to R side of his head. Initially with tachycardic with EMS, then bradycardic. ST elevation noted on EKG and Code STEMI was called, later called off as ST elevation had resolved. Patient lost pulses while in ED and became apneic with cardiac monitoring showing PEA arrest. CPR administered x 9 minutes with Epi x 4 and ROSC, patient never had a shockable rhythm. Intubated peri-arrest. Cards was consulted, noting high risk of VT/VF in the setting of severe CM. AHF was also notified of admission.   Labs demonstrated WBC 13.9, Hgb 14.1, Plt 169. INR 2.2 (on Eliquis). Na 138, K 4.1, CO2 18, Cr 1.79 (baseline ~1.3). CK 205. LA 7.0 > 4.6. Trop 27 > 155. BNP 1073. UA with glucose > 500, +protein. PCT < 0.10. CT Head NAICA, R parietal scalp hematoma. CT C-spine negative for acute findings. CT Chest/A/P with nondisplaced transverse fracture of mid-lower sternal body, acute fx of costal cartilage of bilateral 2nd-6th ribs + 7th costal cartilage, acute fx of anterior 2nd-7th ribs; pulmonary lobular interstitial thickening (?pulm edema), BLL opacities, trace ascites.  Empiric Unasyn started for ?aspiration.   PCCM was consulted for ICU admission. Pertinent  Medical History   Past Medical History:  Diagnosis Date   Acute on chronic combined systolic and diastolic CHF (congestive heart failure) ([place]) 03/19/2019   AKI (acute kidney injury) ([place]) 12/26/2014   Arthralgia 11/04/2014   Arthritis    "proabably"   Atypical mycobacterium infection    Cancer [facility])    family unaware of this hx on [id number]   Chronic systolic heart failure (HCC)    Decreased oral intake 12/26/2014   Decreased oral intake 12/26/2014   Depressed left ventricular ejection fraction 02/05/2020   Dilated cardiomyopathy ([place]) 03/19/2019   Ejection fraction 20 to 25% based on echocardiogram done by pulmonologist month ago.   DJD (degenerative joint disease) 10/07/2014   Dyspnea on exertion 03/19/2019   Essential hypertension 02/05/2020   GERD (gastroesophageal reflux disease)    Hammer toes of both feet 01/30/2018   History of thyroid cancer 12/16/2016   Hypercalcemia 12/26/2014   Hypothyroidism    Hypothyroidism associated with surgical procedure 10/07/2014   Lung mass    Myalgia 11/04/2014   Mycobacterium avium complex ([place])    Nail dystrophy 01/30/2018   Occult malignancy (HCC)    PAF (paroxysmal atrial fibrillation) ([place]) 02/05/2020   Persistent atrial fibrillation ([place]) 03/19/2019   Chads 2 Vascor equals 3   PONV (postoperative nausea and vomiting)    Pre-ulcerative calluses 01/30/2018   Pseudoaneurysm following procedure ([place]) 04/16/2020   Sensorineural hearing loss (SNHL), bilateral 08/11/2018   Shingles    Toenail fungus 01/30/2018   Wears  glasses    Significant Hospital Events: Including procedures, antibiotic start and stop dates in addition to other pertinent events   2/29 - Presented to [facility] ED after 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 Found between radiator and wall with R-sided head injury. Suspected cardiac etiology of unresponsiveness. On ED arrival,  became pulseless (rhythm PEA). CPR x 9 min, Epi x 4 with ROSC. Intubated peri-arrest. CT Head, C-Spine, Chest/A/P completed (as above). Unasyn for ?aspiration. R axillary A-line, L subclavian CVC, ETT tube exchange + bronch.  3/3 - Extubated, back into respiratory failure, reintubated 3/3 - OG tube placement complications, completely replaced, still trouble with placement 3/4 - Started cefepime for respiratory culture showing abundant pseudomonas 3/7 extubated 3/8 transferred to [facility] service 3/9 PCCM reconsulted, increased work of breathing, on BiPAP  Interim History / Subjective:  Is comfortable overnight Remains on BiPAP Very frail complaining of some chest discomfort and overall discomfort  Objective   Blood pressure 111/66, pulse 74, temperature 99.7 F (37.6 C), resp. rate (!) 25, height '5\' 11"'$  (1.803 m), weight 57.9 kg, SpO2 100 %.    Vent Mode: BIPAP;PCV FiO2 (%):  [30 %-50 %] 50 % Set Rate:  [8 bmp] 8 bmp PEEP:  [8 cmH20] 8 cmH20   Intake/Output Summary (Last 24 hours) at 01/23/2023 0842 Last data filed at 01/23/2023 0800 Gross per 24 hour  Intake 1871.96 ml  Output 2310 ml  Net -438.04 ml   Filed Weights   01/21/23 0500 01/22/23 0500 01/23/23 0500  Weight: 60.9 kg 58.6 kg 57.9 kg   Examination: Constitutional: Chronically ill-appearing, very frail Cardio: S1-S2 appreciated Pulm: Does have bilateral rhonchi Abdomen: Bowel sounds appreciated, soft Skin:Warm and dry. Neuro: Moving all extremities but very weak  Resolved Hospital Problem list   AKI Acute encephalopathy, multifactorial Lactic acidosis Hypokalemia Elevated liver enzymes Pulmonary edema Leukocytosis Septic shock Cardiogenic shock  Chest x-ray-pending ABG 7.45/36/68/26  Assessment & Plan:   Elderly gentleman with a history of cardiomyopathy who sustained PEA arrest lasting about 9 minutes before ROSC was achieved He has multiple rib fractures and a sternal fracture He has continued to struggle  with his breathing with poor respiratory reserves  Acute hypoxemic respiratory failure Treated for pneumonia -Will continue BiPAP support, alternating with high flow nasal cannula as tolerated -He may unfortunately deteriorate with having very very poor reserves -Has required 2 intubations during this hospitalization -He is on cefepime for Pseudomonas, today's day 7, Fevers 3/8, plan for 10 days of cefepime and reassess -No worsening of his leukocytosis  Atelectasis secondary to multiple rib fractures, spinal fracture -Continue pain management  Atrial fibrillation -On amiodarone -On heparin  PEA arrest/cardiogenic shock -Was able to wean off pressors -D/heart failure team continues to follow  Lower GI bleed/hematochezia -Continue to monitor -H&H has remained stable -Was seen by GI, poor candidate for invasive interventions  Hypernatremia -Improving with free water  Hypothyroidism On levothyroxine History of thyroid cancer absolutely  Nutrition -Continue cortrak tube feeds  Fluid balance negative since hospitalization  Appears to be diuresing well with -3 L since hospitalization  No changes at present, will continue to follow X-ray pending, last chest x-ray was 3 /5 showing improving infiltrates  Best Practice (right click and "Reselect all SmartList Selections" daily)   Diet/type: tubefeeds DVT prophylaxis: prophylactic heparin  GI prophylaxis: PPI Lines: Central line Foley:  N/A Code Status:  full code Last date of multidisciplinary goals of care discussion   Labs   CBC: Recent  Labs  Lab 01/20/23 1848 01/20/23 2329 01/21/23 0309 01/21/23 1243 01/21/23 2034 01/22/23 0418 01/22/23 1019 01/23/23 0120  WBC 12.7*  --  17.1* 14.1*  --  15.6*  --  12.9*  HGB 11.0*   < > 11.4* 10.2* 11.4* 11.4* 10.5* 10.4*  HCT 32.8*   < > 35.0* 32.3* 33.6* 33.4* 31.0* 32.4*  MCV 110.4*  --  111.1* 113.3*  --  109.5*  --  112.1*  PLT 165  --  203 190  --  223  --  230    < > = values in this interval not displayed.    Basic Metabolic Panel: Recent Labs  Lab 01/19/23 0523 01/19/23 1007 01/20/23 0425 01/20/23 2214 01/20/23 2329 01/21/23 0309 01/22/23 0418 01/22/23 1019 01/22/23 1651 01/23/23 0120  NA 147*   < > 148* 147* 151* 150* 148* 150*  --  147*  K 4.0   < > 4.1 3.9 4.0 4.0 3.4* 3.6  --  3.7  CL 118*  --  119* 117*  --  118* 115*  --   --  117*  CO2 22  --  26 22  --  22 23  --   --  24  GLUCOSE 124*  --  91 146*  --  133* 123*  --   --  116*  BUN 32*  --  29* 38*  --  38* 36*  --   --  36*  CREATININE 1.13  --  1.02 0.98  --  0.99 1.02  --   --  0.97  CALCIUM 9.0  --  10.1 11.0*  --  10.7* 9.9  --   --  9.5  MG 2.4  --   --  2.7*  --   --   --   --  2.5* 2.5*  PHOS 3.7  --   --  2.7  --  2.0* 1.8*  --   --  2.6   < > = values in this interval not displayed.   GFR: Estimated Creatinine Clearance: 43.1 mL/min (by C-G formula based on SCr of 0.97 mg/dL). Recent Labs  Lab 01/17/23 0945 01/18/23 0522 01/21/23 0309 01/21/23 1243 01/22/23 0418 01/23/23 0120  WBC  --    < > 17.1* 14.1* 15.6* 12.9*  LATICACIDVEN 1.7  --   --   --   --   --    < > = values in this interval not displayed.    Liver Function Tests: Recent Labs  Lab 01/17/23 0318 01/18/23 0522 01/20/23 2214 01/23/23 0120  AST 24 17 37 58*  ALT 62* 46* 47* 72*  ALKPHOS 71 81 110 104  BILITOT 1.4* 1.3* 1.6* 0.9  PROT 5.5* 5.8* 7.1 6.0*  ALBUMIN 2.5* 2.4* 2.6* 2.1*   No results for input(s): "LIPASE", "AMYLASE" in the last 168 hours. No results for input(s): "AMMONIA" in the last 168 hours.  ABG    Component Value Date/Time   PHART 7.452 (H) 01/22/2023 1019   PCO2ART 36.1 01/22/2023 1019   PO2ART 68 (L) 01/22/2023 1019   HCO3 24.9 01/22/2023 1019   TCO2 26 01/22/2023 1019   ACIDBASEDEF 2.0 01/20/2023 2329   O2SAT 93 01/22/2023 1019     Coagulation Profile: No results for input(s): "INR", "PROTIME" in the last 168 hours.  Cardiac Enzymes: No results for  input(s): "CKTOTAL", "CKMB", "CKMBINDEX", "TROPONINI" in the last 168 hours.  HbA1C: Hgb A1c MFr Bld  Date/Time Value Ref Range Status  01/13/2023 12:23 PM  5.9 (H) 4.8 - 5.6 % Final    Comment:    (NOTE)         Prediabetes: 5.7 - 6.4         Diabetes: >6.4         Glycemic control for adults with diabetes: <7.0     CBG: Recent Labs  Lab 01/22/23 1522 01/22/23 1924 01/22/23 2326 01/23/23 0329 01/23/23 0752  GLUCAP 107* 110* 110* 104* 104*    Past Medical History:  He,  has a past medical history of Acute on chronic combined systolic and diastolic CHF (congestive heart failure) ([place]) (03/19/2019), AKI (acute kidney injury) ([place]) (12/26/2014), Arthralgia (11/04/2014), Arthritis, Atypical mycobacterium infection, Cancer ([place]), Chronic systolic heart failure ([place]), Decreased oral intake (12/26/2014), Decreased oral intake (12/26/2014), Depressed left ventricular ejection fraction (02/05/2020), Dilated cardiomyopathy ([place]) (03/19/2019), DJD (degenerative joint disease) (10/07/2014), Dyspnea on exertion (03/19/2019), Essential hypertension (02/05/2020), GERD (gastroesophageal reflux disease), Hammer toes of both feet (01/30/2018), History of thyroid cancer (12/16/2016), Hypercalcemia (12/26/2014), Hypothyroidism, Hypothyroidism associated with surgical procedure (10/07/2014), Lung mass, Myalgia (11/04/2014), Mycobacterium avium complex ([place]), Nail dystrophy (01/30/2018), Occult malignancy ([place]), PAF (paroxysmal atrial fibrillation) ([place]) (02/05/2020), Persistent atrial fibrillation ([place]) (03/19/2019), PONV (postoperative nausea and vomiting), Pre-ulcerative calluses (01/30/2018), Pseudoaneurysm following procedure ([place]) (04/16/2020), Sensorineural hearing loss (SNHL), bilateral (08/11/2018), Shingles, Toenail fungus (01/30/2018), and Wears glasses.   Surgical History:   Past Surgical History:  Procedure Laterality Date   APPENDECTOMY     CATARACT EXTRACTION W/ INTRAOCULAR LENS  IMPLANT, BILATERAL Bilateral     COLONOSCOPY     INGUINAL HERNIA REPAIR Left    LEFT HEART CATH AND CORONARY ANGIOGRAPHY N/A 03/11/2020   Procedure: LEFT HEART CATH AND CORONARY ANGIOGRAPHY;  Surgeon: Jettie Booze, MD;  Location: [place] [facility];  Service: Cardiovascular;  Laterality: N/A;   MASS EXCISION Right 08/27/2014   Procedure: EXCISION MASS RIGHT PALM/INDEX;  Surgeon: Leanora Cover, MD;  Location: [facility];  Service: Orthopedics;  Laterality: Right;   THYROIDECTOMY  2003     Social History:   reports that he has never smoked. He has never used smokeless tobacco. He reports that he does not drink alcohol and does not use drugs.   Family History:  His family history is not on file.   Allergies No Known Allergies   The patient is critically ill with multiple organ systems failure and requires high complexity decision making for assessment and support, frequent evaluation and titration of therapies, application of advanced monitoring technologies and extensive interpretation of multiple databases. Critical Care Time devoted to patient care services described in this note independent of APP/resident time (if applicable)  is 32 minutes.   Sherrilyn Rist MD [place] [facility] Personal pager: See Amion If unanswered, please page CCM On-call: [phone]

## 2023-01-24 DIAGNOSIS — E43 Unspecified severe protein-calorie malnutrition: Secondary | ICD-10-CM | POA: Diagnosis not present

## 2023-01-24 DIAGNOSIS — A419 Sepsis, unspecified organism: Secondary | ICD-10-CM | POA: Diagnosis not present

## 2023-01-24 DIAGNOSIS — J9601 Acute respiratory failure with hypoxia: Secondary | ICD-10-CM | POA: Diagnosis not present

## 2023-01-24 DIAGNOSIS — I469 Cardiac arrest, cause unspecified: Secondary | ICD-10-CM | POA: Diagnosis not present

## 2023-01-24 LAB — CBC
HCT: 28.5 % — ABNORMAL LOW (ref 39.0–52.0)
Hemoglobin: 9.2 g/dL — ABNORMAL LOW (ref 13.0–17.0)
MCH: 36.5 pg — ABNORMAL HIGH (ref 26.0–34.0)
MCHC: 32.3 g/dL (ref 30.0–36.0)
MCV: 113.1 fL — ABNORMAL HIGH (ref 80.0–100.0)
Platelets: 247 10*3/uL (ref 150–400)
RBC: 2.52 MIL/uL — ABNORMAL LOW (ref 4.22–5.81)
RDW: 14.3 % (ref 11.5–15.5)
WBC: 11.5 10*3/uL — ABNORMAL HIGH (ref 4.0–10.5)
nRBC: 0 % (ref 0.0–0.2)

## 2023-01-24 LAB — COMPREHENSIVE METABOLIC PANEL
ALT: 102 U/L — ABNORMAL HIGH (ref 0–44)
AST: 85 U/L — ABNORMAL HIGH (ref 15–41)
Albumin: 1.9 g/dL — ABNORMAL LOW (ref 3.5–5.0)
Alkaline Phosphatase: 105 U/L (ref 38–126)
Anion gap: 6 (ref 5–15)
BUN: 32 mg/dL — ABNORMAL HIGH (ref 8–23)
CO2: 23 mmol/L (ref 22–32)
Calcium: 9.6 mg/dL (ref 8.9–10.3)
Chloride: 116 mmol/L — ABNORMAL HIGH (ref 98–111)
Creatinine, Ser: 0.86 mg/dL (ref 0.61–1.24)
GFR, Estimated: >60 mL/min (ref >60)
Glucose, Bld: 120 mg/dL — ABNORMAL HIGH (ref 70–99)
Potassium: 3.8 mmol/L (ref 3.5–5.1)
Sodium: 145 mmol/L (ref 135–145)
Total Bilirubin: 0.7 mg/dL (ref 0.3–1.2)
Total Protein: 5.4 g/dL — ABNORMAL LOW (ref 6.5–8.1)

## 2023-01-24 LAB — GLUCOSE, CAPILLARY
Glucose-Capillary: 108 mg/dL — ABNORMAL HIGH (ref 70–99)
Glucose-Capillary: 103 mg/dL — ABNORMAL HIGH (ref 70–99)
Glucose-Capillary: 117 mg/dL — ABNORMAL HIGH (ref 70–99)
Glucose-Capillary: 109 mg/dL — ABNORMAL HIGH (ref 70–99)
Glucose-Capillary: 110 mg/dL — ABNORMAL HIGH (ref 70–99)
Glucose-Capillary: 107 mg/dL — ABNORMAL HIGH (ref 70–99)

## 2023-01-24 LAB — HEPARIN LEVEL (UNFRACTIONATED)
Heparin Unfractionated: 0.53 IU/mL (ref 0.30–0.70)
Heparin Unfractionated: 0.43 IU/mL (ref 0.30–0.70)

## 2023-01-24 LAB — MAGNESIUM: Magnesium: 2.4 mg/dL (ref 1.7–2.4)

## 2023-01-24 LAB — APTT: aPTT: 93 seconds — ABNORMAL HIGH (ref 24–36)

## 2023-01-24 LAB — PHOSPHORUS: Phosphorus: 2.1 mg/dL — ABNORMAL LOW (ref 2.5–4.6)

## 2023-01-24 MED ORDER — POTASSIUM PHOSPHATES 15 MMOLE/5ML IV SOLN
15.0000 mmol | Freq: Once | INTRAVENOUS | Status: AC
  Administered 2023-01-24: 15 mmol via INTRAVENOUS
  Filled 2023-01-24: qty 5

## 2023-01-24 MED ORDER — SPIRONOLACTONE 12.5 MG HALF TABLET
12.5000 mg | ORAL_TABLET | Freq: Every day | ORAL | Status: DC
  Filled 2023-01-24: qty 1

## 2023-01-24 MED ORDER — SENNOSIDES-DOCUSATE SODIUM 8.6-50 MG PO TABS: 2.0000 | ORAL_TABLET | Freq: Every evening | ORAL | Status: DC | PRN

## 2023-01-24 MED ORDER — SPIRONOLACTONE 12.5 MG HALF TABLET
12.5000 mg | ORAL_TABLET | Freq: Every day | ORAL | Status: DC
  Administered 2023-01-24 – 2023-01-31 (×8): 12.5 mg
  Filled 2023-01-24 (×8): qty 1

## 2023-01-24 MED ORDER — PANTOPRAZOLE SODIUM 40 MG IV SOLR
40.0000 mg | Freq: Two times a day (BID) | INTRAVENOUS | Status: DC
  Administered 2023-01-24 – 2023-02-11 (×35): 40 mg via INTRAVENOUS
  Filled 2023-01-24 (×36): qty 10

## 2023-01-24 MED ORDER — OSMOLITE 1.5 CAL PO LIQD
1000.0000 mL | ORAL | Status: DC
  Administered 2023-01-24 – 2023-02-01 (×8): 1000 mL
  Filled 2023-01-24 (×14): qty 1000

## 2023-01-24 NOTE — Progress Notes (Signed)
RN notified RT of pt removing his oxygen and maintaining oxygen saturations of 100% on RA. Upon assessment pt in no current distress needing no supplemental oxygen at this time. HHFNC and BIPAP on standby in room.

## 2023-01-24 NOTE — Progress Notes (Signed)
RN in room for shift change. Patient had removed oxygen and oxygen probe. No signs of distress, respiratory rate regular and unlabored. Oxygen probe reapplied. Patient O2 saturation >96% without oxygen. Patient remains on room air at this time.

## 2023-01-24 NOTE — Progress Notes (Signed)
[place] for IV heparin Indication: atrial fibrillation  No Known Allergies  Patient Measurements: Height: '5\' 11"'$  (180.3 cm) Weight: 52.1 kg (114 lb 13.8 oz) IBW/kg (Calculated) : 75.3 Heparin Dosing Weight: 58.7 kg (TBW)   Vital Signs: Temp: 99.3 F (37.4 C) (03/11 0400) Temp Source: Bladder (03/11 0400) BP: 128/64 (03/11 0400) Pulse Rate: 75 (03/11 0400)  Labs: Recent Labs    01/22/23 0418 01/22/23 1019 01/22/23 1620 01/23/23 0120 01/23/23 1810 01/24/23 0213  HGB 11.4* 10.5*  --  10.4*  --  9.2*  HCT 33.4* 31.0*  --  32.4*  --  28.5*  PLT 223  --   --  230  --  247  APTT 57*  --    < > 61* 76* 93*  HEPARINUNFRC 0.88*  --    < > 0.76* 0.74* 0.53  CREATININE 1.02  --   --  0.97  --  0.86   < > = values in this interval not displayed.     Estimated Creatinine Clearance: 43.8 mL/min (by C-G formula based on SCr of 0.86 mg/dL).   Medical History: Past Medical History:  Diagnosis Date   Acute on chronic combined systolic and diastolic CHF (congestive heart failure) ([place]) 03/19/2019   AKI (acute kidney injury) ([place]) 12/26/2014   Arthralgia 11/04/2014   Arthritis    "proabably"   Atypical mycobacterium infection    Cancer [facility])    family unaware of this hx on [id number]   Chronic systolic heart failure (HCC)    Decreased oral intake 12/26/2014   Decreased oral intake 12/26/2014   Depressed left ventricular ejection fraction 02/05/2020   Dilated cardiomyopathy ([place]) 03/19/2019   Ejection fraction 20 to 25% based on echocardiogram done by pulmonologist month ago.   DJD (degenerative joint disease) 10/07/2014   Dyspnea on exertion 03/19/2019   Essential hypertension 02/05/2020   GERD (gastroesophageal reflux disease)    Hammer toes of both feet 01/30/2018   History of thyroid cancer 12/16/2016   Hypercalcemia 12/26/2014   Hypothyroidism    Hypothyroidism associated with surgical procedure 10/07/2014   Lung mass    Myalgia 11/04/2014    Mycobacterium avium complex ([place])    Nail dystrophy 01/30/2018   Occult malignancy (HCC)    PAF (paroxysmal atrial fibrillation) ([place]) 02/05/2020   Persistent atrial fibrillation ([place]) 03/19/2019   Chads 2 Vascor equals 3   PONV (postoperative nausea and vomiting)    Pre-ulcerative calluses 01/30/2018   Pseudoaneurysm following procedure ([place]) 04/16/2020   Sensorineural hearing loss (SNHL), bilateral 08/11/2018   Shingles    Toenail fungus 01/30/2018   Wears glasses     Assessment: 78 YOM with hx afib, on Eliquis PTA, last dose 2/29 PTA. Pt with R-scalp hematoma on 2/29, CT head negative for intracranial hemorrhage. Pharmacy consulted to start IV heparin while Eliquis is on hold.   3/11 AM update:  Heparin level just above goal  No need for further aPTT's  RN noted small amount of blood in stool-watch closely   Goal of Therapy:  Heparin level 0.3-0.5 units/ml aPTT 66-85 seconds Monitor platelets by anticoagulation protocol: Yes   Plan:  Dec heparin to 1200 units/hr 1400 heparin level  Narda Bonds, PharmD, BCPS Clinical Pharmacist Phone: [phone]

## 2023-01-24 NOTE — Progress Notes (Signed)
Speech Language Pathology Treatment: Dysphagia  Patient Details Name: Curtis Ramos MRN: [id number] DOB: 05/20/1932 Today's Date: 01/24/2023 Time: [id number] SLP Time Calculation (min) (ACUTE ONLY): 17 min  Assessment / Plan / Recommendation Clinical Impression  Pt appears improved since last session on 3/7. Pt alert, able to sit up and attend and not constantly clearing his airway. When given ice and sips of water he has intermittent throat clearing and expectoration of mucous. Trials of puree are enjoyed and tolerated without signs of dysphagia. Pt would benefit from instrumental assessment prior to starting meals but could have some sips and chips and bites of floor stock applesauce to help clear oropharynx of secretions. RN to monitor for tolerance of this. Will plan for FEES tomorrow at 930 am (earliest available); pt cannot tolerate MBS while on HHFNC.    HPI HPI: 89 year old man who presented to [facility] ED [facility]/29 via EMS after unwitnessed fall at work, patient did not have any complaints prior to fall. He was found down between a radiator and the wall, unresponsive with contusion and deformities to R side of his head. Initially with tachycardic with EMS, then bradycardic. ST elevation noted on EKG and Code STEMI was called, later called off as ST elevation had resolved. Patient lost pulses while in ED and became apneic with cardiac monitoring showing PEA arrest. CPR administered x 9 minutes with Epi x 4 and ROSC, patient never had a shockable rhythm. Intubated peri-arrest 2/28-3/3 then reintubated until 3/6.Marland KitchenPMHx significant for HTN, HFrEF with NICM (cMRI 12/2022 with severe LV dilation, EF 17%, LBBB, followed by Dr. Haroldine Laws), AFib (on Eliquis), MAI infection, GERD, hypothyroidism, history of thyroid CA.      SLP Plan  Other (Comment) (FEES)      Recommendations for follow up therapy are one component of a multi-disciplinary discharge planning process, led by the attending physician.   Recommendations may be updated based on patient status, additional functional criteria and insurance authorization.    Recommendations  Diet recommendations: Other(comment) (sips and chips, bites of puree ok prior to FEES) Medication Administration: Via alternative means Supervision: Staff to assist with self feeding Compensations: Slow rate;Small sips/bites Postural Changes and/or Swallow Maneuvers: Seated upright 90 degrees                Plan: Other (Comment) (FEES)           [name], [name]  01/24/2023, 10:01 AM

## 2023-01-24 NOTE — Progress Notes (Signed)
[facility]                                                                               Curtis Ramos, is a 89 y.o. male, DOB - 12-06-1931, PH:[id number] Admit date - 01/13/2023    Outpatient Primary MD for the patient is [name], [name], MD  LOS - 11  days    Brief summary   89 year old man with prior history of hypertension nonischemic cardiomyopathy and chronic systolic heart failure with left ventricle ejection fraction of 17%, atrial fibrillation on Eliquis, hypothyroidism, history of thyroid cancer, GERD presented to [name] ED on 2/29 after unwitnessed fall at work.  As per the PCCM notes patient was found down between the radiator and the wall unresponsive with contusion.  On arrival to ED patient became apneic and lost pulses and cardiac monitoring showed PEA arrest.  CPR administered for 9 minutes with epi x 4 and ROSC achieved.  Patient was intubated.  Arrest and cardiology was consulted. CT of the chest abdomen and pelvis showed known displaced transverse fractures of the mid lower sternal body, acute fracture of the coastal cartilage of bilateral second through sixth ribs, acute fracture of the anterior second to 7 ribs.  He was admitted to [name] service.  He was started on Unasyn for aspiration pneumonitis, later transitioned to cefepime for Pseudomonas VAP. He was extubated on 01/19/2023, and transferred to [name] on 01/21/2023. ON 3/9 Patient became tachypneic and requiried BIPAP for 24 hours, transitioned to HF oxygen.    Assessment & Plan    Assessment and Plan:   PEA arrest/ Cardiogenic shock:  Weaned off vasopressors.  Cardiology on board and managing heart failure.  Spironolactone added today. 12.5 mg daily.  Renal function appears to be stable.    Acute hypoxic respiratory failure secondary to pneumonia and pulmonary edema in the setting of sternal fractures and aspiration pneumonia Extubated on 01/20/2023 He is doing poorly, he was on 40 lit of HF  oxygen at 30% fio2.  Re consulted PCCM yesterday as he became tachypneic, required BIPAP for 24 hours. but he looks much better today weaned off BIPAP and more aware and alert  and talking to his son at bedside.   Septic shock secondary to pneumonia//COVID/ pseudomonas VAP Lactic acidosis resolved On IV antibiotics cefepime.  Respiratory cultures show pseudomonas.  Fevers in the last 24 hours, blood cultures done and pending.    NICM/ chronic systolic heart failure:  Cardiology on board     Chronic atrial fibrillation with frequent PVC's - rate high between 90 to 100/min with PVC's On IV heparin for anti coagulation.    BRBPR  No external hemorrhoids evident. None later on.  Repeat hemoglobin dropped to 9.2. recheck H&H tonight .  GI consulted,not a candidate for any intervention at this time.     Hypothyroidism:  Int he setting of h.o thyroid cancer Recommend checking thyroid panel in 2 to 4 weeks when acute illness improves.  Resume levothyroxine.    Hypernatremia Resolved with free water boluses.  Free water boluses, sodium improved from 150 to 148 to 147 to 145. Marland Kitchen     AKI due to shock:  Resolved.    Hypophosphatemia:  Replaced.    Hypercalcemia:  Resolved.    Macrocytic anemia:    Nutrition:  On cortrak with tube feeds.  Pt alert and awake and requesting water, will request SLP eval.  Plan for FEES   Urinary retention:  S/p foley catheter placement on 01/21/23.     RN Pressure Injury Documentation:    Malnutrition Type:  Nutrition Problem: Severe Malnutrition Etiology: chronic illness (CHF)   Malnutrition Characteristics:  Signs/Symptoms: severe fat depletion, severe muscle depletion   Nutrition Interventions:  Interventions: Refer to RD note for recommendations  Estimated body mass index is 16.02 kg/m as calculated from the following:   Height as of this encounter: '5\' 11"'$  (1.803 m).   Weight as of this encounter: 52.1  kg.  Code Status: full code.  DVT Prophylaxis:  SCDs Start: 01/13/23 1327   Level of Care: Level of care: Progressive Family Communication: family at bedside.   Disposition Plan:     Remains inpatient appropriate:  still on high flow oxygen.     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 Found between radiator and wall with R-sided head injury. Suspected cardiac etiology of unresponsiveness. On ED arrival, became pulseless (rhythm PEA). CPR x 9 min, Epi x 4 with ROSC. Intubated peri-arrest. CT Head, C-Spine, Chest/A/P completed (as above). Unasyn for ?aspiration. R axillary A-line, L subclavian CVC, ETT tube exchange + bronch.  3/3 - Extubated, back into respiratory failure, reintubated 3/3 - OG tube placement complications, completely replaced, still trouble with placement 3/4 - Started cefepime for respiratory culture showing abundant pseudomonas 3/7 extubated.  3/8 - Transferred to [name] on 3/8 3/8 - GI consulted for BRBPR.     Consultants:   Cardiology PCCM Gastroenterology.  Antimicrobials:   Anti-infectives (From admission, onward)    Start     Dose/Rate Route Frequency Ordered Stop   01/17/23 1100  ceFEPIme (MAXIPIME) 2 g in sodium chloride 0.9 % 100 mL IVPB        2 g 200 mL/hr over 30 Minutes Intravenous Every 12 hours 01/17/23 1040 01/26/23 2359   01/16/23 1400  cefTRIAXone (ROCEPHIN) 2 g in sodium chloride 0.9 % 100 mL IVPB  Status:  Discontinued        2 g 200 mL/hr over 30 Minutes Intravenous Every 24 hours 01/16/23 1136 01/17/23 1028   01/15/23 0815  Ampicillin-Sulbactam (UNASYN) 3 g in sodium chloride 0.9 % 100 mL IVPB  Status:  Discontinued        3 g 200 mL/hr over 30 Minutes Intravenous Every 6 hours 01/15/23 0804 01/16/23 1136   01/13/23 1345  Ampicillin-Sulbactam (UNASYN) 3 g in sodium chloride 0.9 % 100 mL IVPB  Status:  Discontinued        3 g 200 mL/hr over 30 Minutes  Intravenous Every 12 hours 01/13/23 1339 01/15/23 0804        Medications  Scheduled Meds:  amiodarone  200 mg Per Tube Daily   Chlorhexidine Gluconate Cloth  6 each Topical Daily   feeding supplement (PROSource TF20)  60 mL Per Tube Daily   free water  200 mL Per Tube Q4H   levothyroxine  175 mcg Per Tube Daily   lidocaine  1 patch Transdermal Q24H   mouth rinse  15 mL Mouth Rinse 4 times per day   pantoprazole (PROTONIX) IV  40 mg Intravenous Q24H   senna-docusate  2 tablet  Per Tube Daily   Continuous Infusions:  sodium chloride 10 mL/hr at 01/24/23 0700   ceFEPime (MAXIPIME) IV Stopped (01/23/23 2244)   feeding supplement (OSMOLITE 1.5 CAL) 1,000 mL (01/23/23 1324)   heparin 1,200 Units/hr (01/24/23 0700)   PRN Meds:.acetaminophen, docusate, HYDROmorphone (DILAUDID) injection, lip balm, ondansetron (ZOFRAN) IV, mouth rinse, polyethylene glycol    Subjective:   Curtis Ramos was seen and examined today.  More awake today and communicating and requesting water.  Using suction .   Objective:   Vitals:   01/24/23 0600 01/24/23 0700 01/24/23 0754 01/24/23 0800  BP: 138/77 131/81 135/70 132/76  Pulse: 66 68 68 67  Resp: '17 19 20 '$ (!) 27  Temp: 99.1 F (37.3 C) 99.1 F (37.3 C)  98.8 F (37.1 C)  TempSrc: Bladder     SpO2: 100% 100% 100% 100%  Weight:      Height:        Intake/Output Summary (Last 24 hours) at 01/24/2023 0853 Last data filed at 01/24/2023 0700 Gross per 24 hour  Intake 2420.14 ml  Output 1675 ml  Net 745.14 ml    Filed Weights   01/22/23 0500 01/23/23 0500 01/24/23 0300  Weight: 58.6 kg 57.9 kg 52.1 kg     Exam General exam:Ill appearing gentleman, not in distress.  Respiratory system: Bilateral rhonchi, on HF [place] oxygen.  Cardiovascular system: S1 & S2 heard, irregularly irregular.  No JVD, No pedal edema. Gastrointestinal system: Abdomen is nondistended, soft and nontender. Normal bowel sounds heard. Central nervous system: Alert and  oriented to self.  Extremities: no pedal edema.  Skin: No rashes,  Psychiatry: calm      Data Reviewed:  I have personally reviewed following labs and imaging studies   CBC Lab Results  Component Value Date   WBC 11.5 (H) 01/24/2023   RBC 2.52 (L) 01/24/2023   HGB 9.2 (L) 01/24/2023   HCT 28.5 (L) 01/24/2023   MCV 113.1 (H) 01/24/2023   MCH 36.5 (H) 01/24/2023   PLT 247 01/24/2023   MCHC 32.3 01/24/2023   RDW 14.3 0000000     Last metabolic panel Lab Results  Component Value Date   NA 145 01/24/2023   K 3.8 01/24/2023   CL 116 (H) 01/24/2023   CO2 23 01/24/2023   BUN 32 (H) 01/24/2023   CREATININE 0.86 01/24/2023   GLUCOSE 120 (H) 01/24/2023   GFRNONAA >60 01/24/2023   GFRAA 76 10/15/2020   CALCIUM 9.6 01/24/2023   PHOS 2.1 (L) 01/24/2023   PROT 5.4 (L) 01/24/2023   ALBUMIN 1.9 (L) 01/24/2023   LABGLOB 2.6 04/16/2020   AGRATIO 1.7 04/16/2020   BILITOT 0.7 01/24/2023   ALKPHOS 105 01/24/2023   AST 85 (H) 01/24/2023   ALT 102 (H) 01/24/2023   ANIONGAP 6 01/24/2023    CBG (last 3)  Recent Labs    01/23/23 2335 01/24/23 0315 01/24/23 0733  GLUCAP 112* 108* 103*       Coagulation Profile: No results for input(s): "INR", "PROTIME" in the last 168 hours.   Radiology Studies: DG CHEST PORT 1 VIEW  Result Date: 01/22/2023 CLINICAL DATA:  Pneumonia EXAM: PORTABLE CHEST 1 VIEW COMPARISON:  01/18/2023 FINDINGS: Interval extubation. Enteric tube is again seen with distal tip beyond the inferior margin of the film. Stable heart size. Progressive patchy bilateral airspace opacities most pronounced in the right upper lobe and left lung base. Probable small left pleural effusion. No pneumothorax. IMPRESSION: Progressive patchy bilateral airspace opacities most pronounced in the  right upper lobe and left lung base. Electronically Signed   By: Davina Poke D.O.   On: 01/22/2023 11:20       Hosie Poisson M.D. [facility] 01/24/2023, 8:53  AM  Available via Epic secure chat 7am-7pm After 7 pm, please refer to night coverage provider listed on amion.

## 2023-01-24 NOTE — Progress Notes (Addendum)
Inpatient Rehab Admissions Coordinator:   Per therapy recommendations, patient was screened for CIR candidacy by Clemens Catholic, MS, CCC-SLP. At this time, Pt. is not yet at a level where I think he could tolerate the intensity of CIR.   Pt. may have potential to progress to becoming a potential CIR candidate, so CIR admissions team will follow and monitor for progress and participation with therapies and place consult order if Pt. appears to be an appropriate candidate. Please contact me with any questions.   Clemens Catholic, [place], [place] Admissions Coordinator  [phone] ([place]) [phone] (office)

## 2023-01-24 NOTE — Progress Notes (Signed)
NAME:  Curtis Ramos, MRN:  [id number], DOB:  [birth year], LOS: 46 ADMISSION DATE:  01/13/2023, CONSULTATION DATE:  01/13/2023 REFERRING MD:  Trauma, CHIEF COMPLAINT:  Cardiac arrest, post-fall (Level 1 Trauma)   History of Present Illness:  89 year old man who presented to [facility] ED 2/29 via EMS after unwitnessed fall at work. PMHx significant for HTN, HFrEF with NICM (cMRI 12/2022 with severe LV dilation, EF 17%, LBBB, followed by Dr. Haroldine Laws), AFib (on Eliquis), MAI infection, GERD, hypothyroidism, history of thyroid CA.   History obtained from chart review and from family. Patient presented to [facility] ED via EMS after a fall at work; per family, patient did not have any complaints prior to fall. Recently undergoing workup with Cardiology for pacemaker placement. He was found down between a radiator and the wall, unresponsive with contusion and deformities to R side of his head. Initially with tachycardic with EMS, then bradycardic. ST elevation noted on EKG and Code STEMI was called, later called off as ST elevation had resolved. Patient lost pulses while in ED and became apneic with cardiac monitoring showing PEA arrest. CPR administered x 9 minutes with Epi x 4 and ROSC, patient never had a shockable rhythm. Intubated peri-arrest. Cards was consulted, noting high risk of VT/VF in the setting of severe CM. AHF was also notified of admission.   Labs demonstrated WBC 13.9, Hgb 14.1, Plt 169. INR 2.2 (on Eliquis). Na 138, K 4.1, CO2 18, Cr 1.79 (baseline ~1.3). CK 205. LA 7.0 > 4.6. Trop 27 > 155. BNP 1073. UA with glucose > 500, +protein. PCT < 0.10. CT Head NAICA, R parietal scalp hematoma. CT C-spine negative for acute findings. CT Chest/A/P with nondisplaced transverse fracture of mid-lower sternal body, acute fx of costal cartilage of bilateral 2nd-6th ribs + 7th costal cartilage, acute fx of anterior 2nd-7th ribs; pulmonary lobular interstitial thickening (?pulm edema), BLL opacities, trace ascites.  Empiric Unasyn started for ?aspiration.   PCCM was consulted for ICU admission. Pertinent  Medical History   Past Medical History:  Diagnosis Date   Acute on chronic combined systolic and diastolic CHF (congestive heart failure) ([place]) 03/19/2019   AKI (acute kidney injury) ([place]) 12/26/2014   Arthralgia 11/04/2014   Arthritis    "proabably"   Atypical mycobacterium infection    Cancer [facility])    family unaware of this hx on [id number]   Chronic systolic heart failure (HCC)    Decreased oral intake 12/26/2014   Decreased oral intake 12/26/2014   Depressed left ventricular ejection fraction 02/05/2020   Dilated cardiomyopathy ([place]) 03/19/2019   Ejection fraction 20 to 25% based on echocardiogram done by pulmonologist month ago.   DJD (degenerative joint disease) 10/07/2014   Dyspnea on exertion 03/19/2019   Essential hypertension 02/05/2020   GERD (gastroesophageal reflux disease)    Hammer toes of both feet 01/30/2018   History of thyroid cancer 12/16/2016   Hypercalcemia 12/26/2014   Hypothyroidism    Hypothyroidism associated with surgical procedure 10/07/2014   Lung mass    Myalgia 11/04/2014   Mycobacterium avium complex ([place])    Nail dystrophy 01/30/2018   Occult malignancy (HCC)    PAF (paroxysmal atrial fibrillation) ([place]) 02/05/2020   Persistent atrial fibrillation ([place]) 03/19/2019   Chads 2 Vascor equals 3   PONV (postoperative nausea and vomiting)    Pre-ulcerative calluses 01/30/2018   Pseudoaneurysm following procedure ([place]) 04/16/2020   Sensorineural hearing loss (SNHL), bilateral 08/11/2018   Shingles    Toenail fungus 01/30/2018   Wears  glasses    Significant Hospital Events: Including procedures, antibiotic start and stop dates in addition to other pertinent events   2/29 - Presented to 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 Found between radiator and wall with R-sided head injury. Suspected cardiac etiology of unresponsiveness. On ED arrival,  became pulseless (rhythm PEA). CPR x 9 min, Epi x 4 with ROSC. Intubated peri-arrest. CT Head, C-Spine, Chest/A/P completed (as above). Unasyn for ?aspiration. R axillary A-line, L subclavian CVC, ETT tube exchange + bronch.  3/3 - Extubated, back into respiratory failure, reintubated 3/3 - OG tube placement complications, completely replaced, still trouble with placement 3/4 - Started cefepime for respiratory culture showing abundant pseudomonas 3/7 extubated 3/8 transferred to [facility] service 3/9 PCCM reconsulted, increased work of breathing, on BiPAP  Interim History / Subjective:  Comfortable on high flow nasal cannula, remains very frail  Objective   Blood pressure 132/76, pulse 67, temperature 98.8 F (37.1 C), resp. rate (!) 27, height '5\' 11"'$  (1.803 m), weight 52.1 kg, SpO2 100 %.    FiO2 (%):  [33 %-45 %] 33 %   Intake/Output Summary (Last 24 hours) at 01/24/2023 0911 Last data filed at 01/24/2023 0700 Gross per 24 hour  Intake 2398.15 ml  Output 1675 ml  Net 723.15 ml   Filed Weights   01/22/23 0500 01/23/23 0500 01/24/23 0300  Weight: 58.6 kg 57.9 kg 52.1 kg   Examination: Constitutional: Chronically ill-appearing Cardio: S1-S2 appreciated Pulm: Does have rhonchi bilaterally Abdomen: Bowel sounds appreciated Skin: Skin is warm and dry Neuro: Moving all extremities  Resolved Hospital Problem list   AKI Acute encephalopathy, multifactorial Lactic acidosis Hypokalemia Elevated liver enzymes Pulmonary edema Leukocytosis Septic shock Cardiogenic shock  Chest x-ray-not significantly changed from previous with prominence of interstitium ABG 7.45/36/68/26  Assessment & Plan:   Elderly gentleman with history of cardiomyopathy who sustained PEA arrest lasting about 9 minutes before ROSC was achieved Multiple rib fractures and sternal fracture Poor pulmonary reserves Appears more stable this morning  Acute hypoxemic respiratory failure Treated for  pneumonia -Continue high flow oxygen -He has required 2 intubations during this hospitalization -On cefepime for Pseudomonas, plan is for 10 days of cefepime and then reassess provide leukocytosis is stable  Atelectasis secondary to multiple rib fractures, spinal fracture -Continue pain management  Atrial fibrillation -On amiodarone, on heparin  PEA arrest/cardiogenic shock -Was able to wean off pressors -Heart failure team continues to follow  Lower GI bleed -H&H has remained stable, trended down a little bit -Poor candidate for invasive intervention  Hypernatremia -Improving with free water  Hypothyroidism History of thyroid cancer -On levothyroxine  Remains on tube feeding  Did have a discussion with patient's brother on 01/23/2023 about CODE STATUS  PCCM will sign off  Sherrilyn Rist, MD [place] PCCM Pager: See Shea Evans

## 2023-01-24 NOTE — Progress Notes (Signed)
RT NOTE: patient resting comfortably on HFNC this AM.  No respiratory distress noted.  Bipap not indicated at this time.  Will continue to monitor and assess for bipap needs.

## 2023-01-24 NOTE — Progress Notes (Addendum)
Advanced Heart Failure Rounding Note  PCP-Cardiologist: None   Subjective:   2/29 Admitted PEA. CPR ~ 9 min 4 rounds of epi. Intubated and placed on pressors.  3/3 Extubated. Decompensated. Reintubated. Started on dopamine for bradycardia. Palliative Care consulted. -->Full code.  3/4 Diuresed with IV lasix. Negative 2.8 liters.  3/5 Switched to Epi 3/6 extubated.  3/8 GI consulted for hematochezia. Not felt to be candidate for scope.   Off pressors. Remains on heparin drip.     Thirsty. Denies pain.    Objective:   Weight Range: 52.1 kg Body mass index is 16.02 kg/m.   Vital Signs:   Temp:  [98.8 F (37.1 C)-100.6 F (38.1 C)] 98.8 F (37.1 C) (03/11 0800) Pulse Rate:  [66-81] 67 (03/11 0800) Resp:  [17-32] 27 (03/11 0800) BP: (102-138)/(58-89) 132/76 (03/11 0800) SpO2:  [100 %] 100 % (03/11 0800) FiO2 (%):  [33 %-45 %] 33 % (03/11 0754) Weight:  [52.1 kg] 52.1 kg (03/11 0300) Last BM Date : 01/23/23  Weight change: Filed Weights   01/22/23 0500 01/23/23 0500 01/24/23 0300  Weight: 58.6 kg 57.9 kg 52.1 kg    Intake/Output:   Intake/Output Summary (Last 24 hours) at 01/24/2023 0910 Last data filed at 01/24/2023 0700 Gross per 24 hour  Intake 2398.15 ml  Output 1675 ml  Net 723.15 ml   Physical Exam  General:  Elderly Frail.  No resp difficulty HEENT: + Cortrak Neck: supple. no JVD. Carotids 2+ bilat; no bruits. No lymphadenopathy or thryomegaly appreciated. Cor: PMI nondisplaced. Regular rate & rhythm. No rubs, gallops or murmurs. Lungs: clear on 35 liters 33%.  Abdomen: soft, nontender, nondistended. No hepatosplenomegaly. No bruits or masses. Good bowel sounds. Extremities: no cyanosis, clubbing, rash, edema Neuro: alert & orientedx3, cranial nerves grossly intact. moves all 4 extremities w/o difficulty. Affect pleasant  Telemetry  SR 80s with occasional PVCs.    Labs    CBC Recent Labs    01/23/23 0120 01/24/23 0213  WBC 12.9* 11.5*  HGB  10.4* 9.2*  HCT 32.4* 28.5*  MCV 112.1* 113.1*  PLT 230 [id number]   Basic Metabolic Panel Recent Labs    01/23/23 0120 01/24/23 0213  NA 147* 145  K 3.7 3.8  CL 117* 116*  CO2 24 23  GLUCOSE 116* 120*  BUN 36* 32*  CREATININE 0.97 0.86  CALCIUM 9.5 9.6  MG 2.5* 2.4  PHOS 2.6 2.1*   Liver Function Tests Recent Labs    01/23/23 0120 01/24/23 0213  AST 58* 85*  ALT 72* 102*  ALKPHOS 104 105  BILITOT 0.9 0.7  PROT 6.0* 5.4*  ALBUMIN 2.1* 1.9*   No results for input(s): "LIPASE", "AMYLASE" in the last 72 hours. Cardiac Enzymes No results for input(s): "CKTOTAL", "CKMB", "CKMBINDEX", "TROPONINI" in the last 72 hours.  BNP: BNP (last 3 results) Recent Labs    12/07/22 1307 12/31/22 1156 01/13/23 1344  BNP >4,500.0* 1,669.4* 1,073.1*    ProBNP (last 3 results) Recent Labs    06/18/22 1331 10/05/22 0848  PROBNP 1,795* 3,434*     D-Dimer No results for input(s): "DDIMER" in the last 72 hours. Hemoglobin A1C No results for input(s): "HGBA1C" in the last 72 hours. Fasting Lipid Panel No results for input(s): "CHOL", "HDL", "LDLCALC", "TRIG", "CHOLHDL", "LDLDIRECT" in the last 72 hours.  Thyroid Function Tests No results for input(s): "TSH", "T4TOTAL", "T3FREE", "THYROIDAB" in the last 72 hours.  Invalid input(s): "FREET3"  Other results:   Imaging  No results found.   Medications:     Scheduled Medications:  amiodarone  200 mg Per Tube Daily   Chlorhexidine Gluconate Cloth  6 each Topical Daily   feeding supplement (PROSource TF20)  60 mL Per Tube Daily   free water  200 mL Per Tube Q4H   levothyroxine  175 mcg Per Tube Daily   lidocaine  1 patch Transdermal Q24H   mouth rinse  15 mL Mouth Rinse 4 times per day   pantoprazole (PROTONIX) IV  40 mg Intravenous Q24H   senna-docusate  2 tablet Per Tube Daily    Infusions:  sodium chloride 10 mL/hr at 01/24/23 0700   ceFEPime (MAXIPIME) IV Stopped (01/23/23 2244)   feeding supplement (OSMOLITE  1.5 CAL)     heparin 1,200 Units/hr (01/24/23 0700)    PRN Medications: acetaminophen, docusate, HYDROmorphone (DILAUDID) injection, lip balm, ondansetron (ZOFRAN) IV, mouth rinse, polyethylene glycol    Patient Profile  Curtis Ramos is a 89 year old with a history of HFrEF, PVCs, LBBB, MAI infection, GERD, HTN, hypothyroidism, and thyroid cancer.   Admitted  PEA arrest/shock. CPR  ~ 9 min -->nondisplaced transverse fracture of mid-lower sternal body, acute fx of costal cartilage of bilateral 2nd-6th ribs + 7th costal cartilage, acute fx of anterior 2nd-7th ribs.    Assessment/Plan   1. Cardiac Arrest--> Shock --> cardiogenic shock - PEA arrest--> 9 min CPR 4 rounds epi. Unshockable rhythm.  - Rib fracture 2-7 ribs  - Off pressors. Central line out.   2. A/C HFrEF, NICM -->Cardiogenic shock - Echo 5/20 EF 20-25% - Echo 12/21 EF 55-60% - Echo  11/23   EF 20-25% LV markedly dilated + dyssynchrony - RV ok  Moderate MR/TR severe biatrial enlargement  - Cath 2012: No CAD (cath not repeated as low suspicion for iCM and CKD 3b) - cMRI 2/24: LBBB, LV EF 17%. RV EF 33%. Severe LAEt. No LGE.  - Zio 2/24  AFL 100% avg rat 67 PVC 1.8% - Off pressors.  - Add 12.5 mg spironolactone daily. - Renal function stable.   3. Acute Hypoxic Respiratory Failure in setting of cardiac arrest and aspiration PNA/sternal fractures - Extubated 3/3 but decompensated and required reintubation. - Continue abx (unasyn has high salt load watch fluid status) - Extubated 01/19/23.  - Remains on HFNC.   4. Chronic AF and frequent PVCs - has been on amio as outpatient.  - Continue amio 200 mg daily.   - off AC with fractures  - Continue  heparin drip. Down the road switch to Eliquis.  - SR today.   5. LBBB - was being worked up for CRT  6. Rib/sternal fractures + scalp hematoma - due to CPR.  -CT - R scalp hematoma. No acute intracranial abnormality.     7. AKI due to ATN/shock - Creatinine baseline~  1.6 - Resolved.   8. Hypothyroidism  - H/O Thyroid cancer. TSH 29 T4 1.26 - Suspect in the setting of acute event.  - On levothyroxine.   9. Shock liver - due to arrest -> LFTs continue to improve.   10. Hypernatremia - Na down to 145. - free water boluses per CCM.   11. Bradycardia -He has not been on nodal blockers.  - Resolved. Back on amio    12. Anemia  -GI consulted last week for hematochezia. No plan for scope.  - Hgb down to 9.2 today. Had hematochezia over night.  - PTT 91. On heparin drip. Discussed with  pharmacy.  - On PPI.   13. [place] following. Full Code.     Length of Stay: [place], NP  01/24/2023, 9:10 AM  Advanced Heart Failure Team Pager [phone] (M-F; 7a - 5p)  Please contact [facility] for night-coverage after hours (5p -7a ) and weekends on [email or web address]  Patient seen with NP, agree with the above note.   He remains on HFNC, on cefepime for Pseudomonas PNA.  Creatinine stable.  He is in NSR on amiodarone.   General: NAD Neck: No JVD, no thyromegaly or thyroid nodule.  Lungs: Rhonchi CV: Nondisplaced PMI.  Heart regular S1/S2, no S3/S4, no murmur.  No peripheral edema.  Abdomen: Soft, nontender, no hepatosplenomegaly, no distention.  Skin: Intact without lesions or rashes.  Neurologic: Alert and oriented x 3.  Psych: Normal affect. Extremities: No clubbing or cyanosis.  HEENT: Normal.   Would continue amiodarone and heparin gtt, can transition to Eliquis when no further procedures anticipated.   Volume status stable, will add spironolactone 12.5 daily today.   Pseudomonas PNA, remains on HFNC.  Ongoing cefepime course.   Patient currently working with PT, very debilitated requiring 2 person assist.   Cardiology to follow at a distance.  Please call with any questions.   Loralie Champagne 01/24/2023 12:08 PM

## 2023-01-24 NOTE — Evaluation (Signed)
Physical Therapy Evaluation Patient Details Name: Curtis Ramos MRN: [id number] DOB: 09/01/1932 Today's Date: 01/24/2023  History of Present Illness  Pt is 89 year old presented to [facility] on  01/13/23 after unwitnessed fall at work. In ED pt had PEA arrest with CPR x 9 minutes and intubated. Pt with respiratory failure and septic shock due to pulmonary edema, atelectasis post CPR rib/sternal fxs, PNA, and acute on chronic heart failure. Failed extubation 3/3 and reintubated until extubated 3/6. PMH - HTN, heart failure, afib, thyroid CA  Clinical Impression  Pt admitted with above diagnosis. Pt needing max assist of 2 to stand and could not stand fully upright. Pt with poor endurance and pt was independent PTA and work ing full time. REcommend AIR for pt to return to prior level of function.  Pt currently with functional limitations due to the deficits listed below (see PT Problem List). Pt will benefit from skilled PT to increase their independence and safety with mobility to allow discharge to the venue listed below.          Recommendations for follow up therapy are one component of a multi-disciplinary discharge planning process, led by the attending physician.  Recommendations may be updated based on patient status, additional functional criteria and insurance authorization.  Follow Up Recommendations Acute inpatient rehab (3hours/day)      Assistance Recommended at Discharge Frequent or constant Supervision/Assistance  Patient can return home with the following  A lot of help with walking and/or transfers;A lot of help with bathing/dressing/bathroom;Assistance with cooking/housework;Assist for transportation;Help with stairs or ramp for entrance    Equipment Recommendations None recommended by PT  Recommendations for Other Services  Rehab consult    Functional Status Assessment Patient has had a recent decline in their functional status and demonstrates the ability to make significant  improvements in function in a reasonable and predictable amount of time.     Precautions / Restrictions Precautions Precautions: Fall Precaution Comments: cortrak, HHFNC Restrictions Weight Bearing Restrictions: No      Mobility  Bed Mobility Overal bed mobility: Needs Assistance Bed Mobility: Rolling, Sidelying to Sit, Sit to Supine Rolling: Mod assist Sidelying to sit: Max assist   Sit to supine: Total assist, +2 for physical assistance, +2 for safety/equipment   General bed mobility comments: Needed assist to roll and to come to eOB.    Transfers Overall transfer level: Needs assistance Equipment used: 2 person hand held assist Transfers: Sit to/from Stand Sit to Stand: Max assist, +2 physical assistance           General transfer comment: Pt needed max assist to stand with heavy posterior lean.  2 attempts but coudl not achieve full upright stance.    Ambulation/Gait                  Stairs            Wheelchair Mobility    Modified Rankin (Stroke Patients Only)       Balance Overall balance assessment: Needs assistance Sitting-balance support: Feet supported, No upper extremity supported, Bilateral upper extremity supported Sitting balance-Leahy Scale: Poor Sitting balance - Comments: intermittently fair with lean to left and posteriorly initially. Took incr time to get feet on fllor   Standing balance support: Bilateral upper extremity supported, During functional activity Standing balance-Leahy Scale: Poor Standing balance comment: relies on UE support and could not stand all the way up.  Pertinent Vitals/Pain Pain Assessment Pain Assessment: Faces Faces Pain Scale: Hurts a little bit Breathing: normal Negative Vocalization: none Facial Expression: smiling or inexpressive Body Language: tense, distressed pacing, fidgeting Consolability: no need to console PAINAD Score: 1 Pain Location:  generalized with movement Pain Descriptors / Indicators: Discomfort, Grimacing Pain Intervention(s): Limited activity within patient's tolerance, Monitored during session, Repositioned    Home Living Family/patient expects to be discharged to:: Private residence Living Arrangements: Alone Available Help at Discharge: Family;Available 24 hours/day Type of Home: House Home Access: Stairs to enter   [employer] of Steps: 2-3 Alternate Level Stairs-Number of Steps: 8 up and 8 down at the front door Home Layout: Multi-level Home Equipment: Conservation officer, nature (2 wheels);Shower seat;Grab bars - tub/shower Additional Comments: bed/bath are downstairs, kitchen is upstairs    Prior Function Prior Level of Function : Independent/Modified Independent;Working/employed;Driving             Mobility Comments: no AD ADLs Comments: indep, drives, works     Journalist, newspaper   Dominant Hand: Right    Extremity/Trunk Assessment   Upper Extremity Assessment Upper Extremity Assessment: Defer to OT evaluation    Lower Extremity Assessment Lower Extremity Assessment: Generalized weakness    Cervical / Trunk Assessment Cervical / Trunk Assessment: Kyphotic  Communication   Communication: No difficulties  Cognition Arousal/Alertness: Awake/alert Behavior During Therapy: WFL for tasks assessed/performed Overall Cognitive Status: Impaired/Different from baseline Area of Impairment: Orientation, Memory, Following commands, Safety/judgement, Awareness, Problem solving                 Orientation Level: Disoriented to, Time, Situation   Memory: Decreased short-term memory Following Commands: Follows one step commands with increased time Safety/Judgement: Decreased awareness of safety, Decreased awareness of deficits Awareness: Intellectual Problem Solving: Slow processing, Decreased initiation, Requires verbal cues General Comments: Oriented to self and place, required frequent  directional cues to initiate and sequence tasks. Moments of delirium/confusion        General Comments General comments (skin integrity, edema, etc.): VSS on HHFNC - 35L/min, 35%FiO2; 69 bpm, 100%O2, 128/66    Exercises General Exercises - Lower Extremity Ankle Circles/Pumps: AROM, Both, 5 reps, Supine Long Arc Quad: AROM, Both, 5 reps, Supine Heel Slides: AROM, Both, 10 reps, Supine   Assessment/Plan    PT Assessment Patient needs continued PT services  PT Problem List Decreased activity tolerance;Decreased balance;Decreased mobility;Decreased knowledge of use of DME;Decreased safety awareness;Decreased knowledge of precautions;Cardiopulmonary status limiting activity       PT Treatment Interventions DME instruction;Functional mobility training;Therapeutic activities;Therapeutic exercise;Balance training;Patient/family education;Gait training;Stair training    PT Goals (Current goals can be found in the Care Plan section)  Acute Rehab PT Goals Patient Stated Goal: to go home PT Goal Formulation: With patient Time For Goal Achievement: 02/07/23 Potential to Achieve Goals: Good    Frequency Min 3X/week     Co-evaluation               AM-PAC PT "6 Clicks" Mobility  Outcome Measure Help needed turning from your back to your side while in a flat bed without using bedrails?: A Lot Help needed moving from lying on your back to sitting on the side of a flat bed without using bedrails?: A Lot Help needed moving to and from a bed to a chair (including a wheelchair)?: Total Help needed standing up from a chair using your arms (e.g., wheelchair or bedside chair)?: Total Help needed to walk in hospital room?: Total Help needed climbing 3-5 steps  with a railing? : Total 6 Click Score: 8    End of Session Equipment Utilized During Treatment: Gait belt;Oxygen Activity Tolerance: Patient limited by fatigue Patient left: in bed;with call bell/phone within reach;with bed alarm  set Nurse Communication: Mobility status;Need for lift equipment PT Visit Diagnosis: Unsteadiness on feet (R26.81);Muscle weakness (generalized) (M62.81)    Time: [id number] PT Time Calculation (min) (ACUTE ONLY): 21 min   Charges:   PT Evaluation $PT Eval Moderate Complexity: 1 Mod          Alivia Cimino M,PT Acute Rehab Services [phone]   Alvira Philips 01/24/2023, 3:56 PM

## 2023-01-24 NOTE — Progress Notes (Signed)
Nutrition Follow-up  DOCUMENTATION CODES:  Severe malnutrition in context of chronic illness  INTERVENTION:  Increase rate: Osmolite 1.5 at 60 ml/h (1440 ml per day) via cortrak - Prosource TF20 60 ml 1x/d - Free water 221m q4h - Provides 2240 kcal, 110 gm protein, 1097 ml free water daily (22952mfree water + flush)   NUTRITION DIAGNOSIS:   Severe Malnutrition related to chronic illness (CHF) as evidenced by severe fat depletion, severe muscle depletion. - remains applicable  GOAL:   Patient will meet greater than or equal to 90% of their needs - progressing, met with TF at goal  MONITOR:   Labs, Vent status, TF tolerance  REASON FOR ASSESSMENT:   Ventilator, Consult Enteral/tube feeding initiation and management  ASSESSMENT:   Pt with hx of CHF, HTN, GERD, and PAF presented to ED after being found down with a contusion and deformities to the right side of the head. Suffered PEA arrest in ED and CPR x 9 minutes.  2/29 - intubated, bronchoscopy 3/1 - cortrak placement pending (unable to place due to agitation, TF initiated via OGT) 3/3 - extubated, reintubated 3/4 - cortrak placed (terminates distal stomach) 3/6 - extubated 3/7 - SLP recommends NPO  Pt resting in bed at the time of assessment. On HFNC and able to talk. Pt reports that overall he is feeling very well today. Denies any pain or abdominal discomfort. Noted that weight seems to be trending down. Increased TF rate slightly to provide more kcal and prevent further weight loss. Do question the accuracy of the weight today as it is ~15 lb less than yesterday.   Intake/Output Summary (Last 24 hours) at 01/24/2023 0914 Last data filed at 01/24/2023 0700 Gross per 24 hour  Intake 2398.15 ml  Output 1675 ml  Net 723.15 ml  Net IO Since Admission: -1,586.39 mL [01/24/23 0914]  Nutritionally Relevant Medications: Scheduled Meds:  PROSource TF20  60 mL Per Tube Daily   free water  200 mL Per Tube Q4H    pantoprazole IV  40 mg Intravenous Q24H   senna-docusate  2 tablet Per Tube Daily   Continuous Infusions:  ceFEPime (MAXIPIME) IV Stopped (01/23/23 2244)   feeding supplement (OSMOLITE 1.5 CAL) 1,000 mL (01/23/23 1324)   PRN Meds: docusate, ondansetron IV, polyethylene glycol  Labs Reviewed: BUN 32 Phosphorus 2.1 CBG ranges from 102-121 mg/dL over the last 24 hours  NUTRITION - FOCUSED PHYSICAL EXAM: Flowsheet Row Most Recent Value  Orbital Region Severe depletion  Upper Arm Region Severe depletion  Thoracic and Lumbar Region Moderate depletion  Buccal Region Severe depletion  Temple Region Severe depletion  Clavicle Bone Region Moderate depletion  Clavicle and Acromion Bone Region Severe depletion  Scapular Bone Region Moderate depletion  Dorsal Hand Unable to assess  Patellar Region Severe depletion  Anterior Thigh Region Severe depletion  Posterior Calf Region Severe depletion  Edema (RD Assessment) None  Hair Reviewed  Eyes Reviewed  Mouth Reviewed  Skin Reviewed  Nails Reviewed   Diet Order:   Diet Order             Diet NPO time specified  Diet effective now                   EDUCATION NEEDS:  Not appropriate for education at this time  Skin:  Skin Assessment: Reviewed RN Assessment  Last BM:  3/10  Height:   Ht Readings from Last 1 Encounters:  01/18/23 '5\' 11"'$  (1.803 m)  Weight:   Wt Readings from Last 1 Encounters:  01/24/23 52.1 kg    Ideal Body Weight:  78.2 kg  BMI:  Body mass index is 16.02 kg/m.  Estimated Nutritional Needs:  Kcal:  1900-2100 kcal/d Protein:  95-105g/d Fluid:  1.8-2L/d    Ranell Patrick, RD, LDN Clinical Dietitian RD pager # available in AMION  After hours/weekend pager # available in [facility]

## 2023-01-24 NOTE — Progress Notes (Signed)
[place] for IV heparin Indication: atrial fibrillation  No Known Allergies  Patient Measurements: Height: '5\' 11"'$  (180.3 cm) Weight: 52.1 kg (114 lb 13.8 oz) IBW/kg (Calculated) : 75.3 Heparin Dosing Weight: 58.7 kg (TBW)   Vital Signs: Temp: 99.5 F (37.5 C) (03/11 1200) Temp Source: Bladder (03/11 0600) BP: 151/78 (03/11 1200) Pulse Rate: 76 (03/11 1200)  Labs: Recent Labs    01/22/23 0418 01/22/23 1019 01/22/23 1620 01/23/23 0120 01/23/23 1810 01/24/23 0213  HGB 11.4* 10.5*  --  10.4*  --  9.2*  HCT 33.4* 31.0*  --  32.4*  --  28.5*  PLT 223  --   --  230  --  247  APTT 57*  --    < > 61* 76* 93*  HEPARINUNFRC 0.88*  --    < > 0.76* 0.74* 0.53  CREATININE 1.02  --   --  0.97  --  0.86   < > = values in this interval not displayed.     Estimated Creatinine Clearance: 43.8 mL/min (by C-G formula based on SCr of 0.86 mg/dL).   Medical History: Past Medical History:  Diagnosis Date   Acute on chronic combined systolic and diastolic CHF (congestive heart failure) ([place]) 03/19/2019   AKI (acute kidney injury) ([place]) 12/26/2014   Arthralgia 11/04/2014   Arthritis    "proabably"   Atypical mycobacterium infection    Cancer [name])    family unaware of this hx on [id number]   Chronic systolic heart failure (HCC)    Decreased oral intake 12/26/2014   Decreased oral intake 12/26/2014   Depressed left ventricular ejection fraction 02/05/2020   Dilated cardiomyopathy ([place]) 03/19/2019   Ejection fraction 20 to 25% based on echocardiogram done by pulmonologist month ago.   DJD (degenerative joint disease) 10/07/2014   Dyspnea on exertion 03/19/2019   Essential hypertension 02/05/2020   GERD (gastroesophageal reflux disease)    Hammer toes of both feet 01/30/2018   History of thyroid cancer 12/16/2016   Hypercalcemia 12/26/2014   Hypothyroidism    Hypothyroidism associated with surgical procedure 10/07/2014   Lung mass    Myalgia 11/04/2014    Mycobacterium avium complex ([place])    Nail dystrophy 01/30/2018   Occult malignancy (HCC)    PAF (paroxysmal atrial fibrillation) ([place]) 02/05/2020   Persistent atrial fibrillation ([place]) 03/19/2019   Chads 2 Vascor equals 3   PONV (postoperative nausea and vomiting)    Pre-ulcerative calluses 01/30/2018   Pseudoaneurysm following procedure ([place]) 04/16/2020   Sensorineural hearing loss (SNHL), bilateral 08/11/2018   Shingles    Toenail fungus 01/30/2018   Wears glasses     Assessment: 24 YOM with hx afib, on Eliquis PTA, last dose 2/29 PTA. Pt with R-scalp hematoma on 2/29, CT head negative for intracranial hemorrhage. Pharmacy consulted to start IV heparin while Eliquis is on hold.   3/11 PM update:  Heparin level now therapeutic at 0.43 after rate decrease this morning Hg down to 9.2, plt WNL RN noted small amount of blood in stool this morning, TRH and HF team aware - watching closely.  Goal of Therapy:  Heparin level 0.3-0.5 units/ml aPTT 66-85 seconds Monitor platelets by anticoagulation protocol: Yes   Plan:  Continue heparin at 1200 units/hr Confirmatory heparin level with AM labs Monitor daily heparin level and CBC, s/sx bleeding   Arturo Morton, PharmD, BCPS Please check AMION for all [place] contact numbers Clinical Pharmacist 01/24/2023 3:01 PM

## 2023-01-24 NOTE — Evaluation (Signed)
Occupational Therapy Evaluation Patient Details Name: Curtis Ramos MRN: [id number] DOB: 1932-02-05 Today's Date: 01/24/2023   History of Present Illness Pt is 89 year old presented to [facility] on  01/13/23 after unwitnessed fall at work. In ED pt had PEA arrest with CPR x 9 minutes and intubated. Pt with respiratory failure and septic shock due to pulmonary edema, atelectasis post CPR rib/sternal fxs, PNA, and acute on chronic heart failure. Failed extubation 3/3 and reintubated until extubated 3/6. PMH - HTN, heart failure, afib, thyroid CA   Clinical Impression   Curtis Ramos was evaluated s/p the above admission list. He is indep and lives alone at baseline. Upon evaluation he was limited by impaired cognition, respiratory status on HHFNC, decreased activity tolerance, generalized weakness and dizziness with activity. Overall he required up to max A for bed mobility and max A +2 to stand EOB 2x. Due to the deficits listed below, he also requires up to max A for UB and LB ADLs and total A for LB dressing. He was eager to participate throughout and tolerated the session well with VSS. Pt will benefit from continued acute OT services. Due to pt's indep baseline, great family support and excellent rehab potential recommend d/c to AIR for maximal functional recovery.       Recommendations for follow up therapy are one component of a multi-disciplinary discharge planning process, led by the attending physician.  Recommendations may be updated based on patient status, additional functional criteria and insurance authorization.   Follow Up Recommendations  Acute inpatient rehab (3hours/day)     Assistance Recommended at Discharge Frequent or constant Supervision/Assistance  Patient can return home with the following A lot of help with walking and/or transfers;A lot of help with bathing/dressing/bathroom;Assistance with cooking/housework;Direct supervision/assist for medications management;Assistance with  feeding;Direct supervision/assist for financial management;Assist for transportation;Help with stairs or ramp for entrance    Functional Status Assessment  Patient has had a recent decline in their functional status and demonstrates the ability to make significant improvements in function in a reasonable and predictable amount of time.  Equipment Recommendations  Other (comment) (defer)    Recommendations for Other Services Rehab consult     Precautions / Restrictions Precautions Precautions: Fall Precaution Comments: cortrak, HHFNC Restrictions Weight Bearing Restrictions: No      Mobility Bed Mobility Overal bed mobility: Needs Assistance Bed Mobility: Rolling, Sidelying to Sit, Sit to Supine Rolling: Mod assist Sidelying to sit: Max assist   Sit to supine: Total assist, +2 for physical assistance, +2 for safety/equipment        Transfers Overall transfer level: Needs assistance Equipment used: 2 person hand held assist Transfers: Sit to/from Stand Sit to Stand: Max assist, +2 physical assistance, +2 safety/equipment           General transfer comment: x2      Balance Overall balance assessment: Needs assistance Sitting-balance support: Feet supported Sitting balance-Leahy Scale: Poor Sitting balance - Comments: intermittently fair   Standing balance support: Bilateral upper extremity supported, During functional activity Standing balance-Leahy Scale: Poor                             ADL either performed or assessed with clinical judgement   ADL Overall ADL's : Needs assistance/impaired Eating/Feeding: NPO   Grooming: Maximal assistance;Bed level   Upper Body Bathing: Maximal assistance;Sitting   Lower Body Bathing: Maximal assistance;Sit to/from stand;+2 for physical assistance   Upper Body Dressing :  Maximal assistance;Sitting   Lower Body Dressing: Total assistance;+2 for physical assistance;Sit to/from stand   Toilet Transfer:  Maximal assistance;+2 for physical assistance;Stand-pivot;BSC/3in1   Toileting- Clothing Manipulation and Hygiene: Total assistance;+2 for physical assistance;Sit to/from stand       Functional mobility during ADLs: Maximal assistance;+2 for physical assistance General ADL Comments: impaired cognition, fatigue, activity tolerance, balance, HHFNC     Vision Baseline Vision/History: 0 No visual deficits Vision Assessment?: Vision impaired- to be further tested in functional context Additional Comments: difficult to assess, attending to therapist     Perception Perception Perception Tested?: No   Praxis Praxis Praxis tested?: Not tested    Pertinent Vitals/Pain Pain Assessment Pain Assessment: Faces Faces Pain Scale: Hurts a little bit Pain Location: generalized with movement Pain Descriptors / Indicators: Discomfort, Grimacing     Hand Dominance Right   Extremity/Trunk Assessment Upper Extremity Assessment Upper Extremity Assessment: Generalized weakness   Lower Extremity Assessment Lower Extremity Assessment: Defer to PT evaluation   Cervical / Trunk Assessment Cervical / Trunk Assessment: Kyphotic   Communication Communication Communication: No difficulties   Cognition Arousal/Alertness: Awake/alert Behavior During Therapy: WFL for tasks assessed/performed Overall Cognitive Status: Impaired/Different from baseline Area of Impairment: Orientation, Memory, Following commands, Safety/judgement, Awareness, Problem solving                 Orientation Level: Disoriented to, Time, Situation   Memory: Decreased short-term memory Following Commands: Follows one step commands with increased time Safety/Judgement: Decreased awareness of safety, Decreased awareness of deficits Awareness: Intellectual Problem Solving: Slow processing, Decreased initiation, Requires verbal cues General Comments: Oriented to self and place, required frequent directional cues to  initiate and sequence tasks. Moments of delirium/confusion     General Comments  VSS on HHFNC, pt reported dizziness            Home Living Family/patient expects to be discharged to:: Private residence Living Arrangements: Alone Available Help at Discharge: Family;Available 24 hours/day Type of Home: House Home Access: Stairs to enter [employer] of Steps: 2-3   Home Layout: Multi-level Alternate Level Stairs-Number of Steps: 8 up and 8 down at the front door Alternate Level Stairs-Rails: Right;Left Bathroom Shower/Tub: Occupational psychologist: Handicapped height     Home Equipment: Conservation officer, nature (2 wheels);Shower seat;Grab bars - tub/shower   Additional Comments: bed/bath are downstairs, kitchen is upstairs      Prior Functioning/Environment Prior Level of Function : Independent/Modified Independent;Working/employed;Driving             Mobility Comments: no AD ADLs Comments: indep, drives, works        OT Problem List: Decreased strength;Decreased range of motion;Decreased activity tolerance;Impaired balance (sitting and/or standing);Decreased safety awareness;Decreased knowledge of use of DME or AE;Decreased cognition;Decreased knowledge of precautions;Cardiopulmonary status limiting activity      OT Treatment/Interventions: Self-care/ADL training;Therapeutic exercise;Energy conservation;DME and/or AE instruction;Therapeutic activities;Balance training;Patient/family education    OT Goals(Current goals can be found in the care plan section) Acute Rehab OT Goals Patient Stated Goal: per family, to go home OT Goal Formulation: With patient Time For Goal Achievement: 02/07/23 Potential to Achieve Goals: Good ADL Goals Pt Will Perform Grooming: sitting;with min assist Pt Will Perform Upper Body Dressing: sitting;with min assist Pt Will Perform Lower Body Dressing: sit to/from stand;with mod assist Pt Will Transfer to Toilet: stand pivot  transfer;bedside commode;with mod assist  OT Frequency: Min 2X/week    AM-PAC OT "6 Clicks" Daily Activity     Outcome Measure Help  from another person eating meals?: Total Help from another person taking care of personal grooming?: A Lot Help from another person toileting, which includes using toliet, bedpan, or urinal?: A Lot Help from another person bathing (including washing, rinsing, drying)?: A Lot Help from another person to put on and taking off regular upper body clothing?: A Lot Help from another person to put on and taking off regular lower body clothing?: Total 6 Click Score: 10   End of Session Equipment Utilized During Treatment: Gait belt;Oxygen Nurse Communication: Mobility status  Activity Tolerance: Patient tolerated treatment well;Patient limited by fatigue Patient left: in bed;with call bell/phone within reach;with bed alarm set;with family/visitor present  OT Visit Diagnosis: Unsteadiness on feet (R26.81);Other abnormalities of gait and mobility (R26.89);Muscle weakness (generalized) (M62.81);History of falling (Z91.81)                Time: 1333-1410 OT Time Calculation (min): 37 min Charges:  OT General Charges $OT Visit: 1 Visit OT Evaluation $OT Eval Moderate Complexity: 1 Mod OT Treatments $Self Care/Home Management : 8-22 mins  Shade Flood, OTR/L Acute Rehabilitation Services Office [place] Communication Preferred   Elliot Cousin 01/24/2023, 2:55 PM

## 2023-01-25 ENCOUNTER — Other Ambulatory Visit: Payer: Self-pay

## 2023-01-25 ENCOUNTER — Inpatient Hospital Stay (HOSPITAL_COMMUNITY): Payer: Medicare Other

## 2023-01-25 DIAGNOSIS — A419 Sepsis, unspecified organism: Secondary | ICD-10-CM | POA: Diagnosis not present

## 2023-01-25 DIAGNOSIS — J9601 Acute respiratory failure with hypoxia: Secondary | ICD-10-CM | POA: Diagnosis not present

## 2023-01-25 DIAGNOSIS — I5022 Chronic systolic (congestive) heart failure: Secondary | ICD-10-CM | POA: Diagnosis not present

## 2023-01-25 DIAGNOSIS — E43 Unspecified severe protein-calorie malnutrition: Secondary | ICD-10-CM | POA: Diagnosis not present

## 2023-01-25 DIAGNOSIS — I469 Cardiac arrest, cause unspecified: Secondary | ICD-10-CM | POA: Diagnosis not present

## 2023-01-25 LAB — COMPREHENSIVE METABOLIC PANEL
ALT: 135 U/L — ABNORMAL HIGH (ref 0–44)
AST: 105 U/L — ABNORMAL HIGH (ref 15–41)
Albumin: 2.1 g/dL — ABNORMAL LOW (ref 3.5–5.0)
Alkaline Phosphatase: 118 U/L (ref 38–126)
Anion gap: 4 — ABNORMAL LOW (ref 5–15)
BUN: 28 mg/dL — ABNORMAL HIGH (ref 8–23)
CO2: 24 mmol/L (ref 22–32)
Calcium: 9.6 mg/dL (ref 8.9–10.3)
Chloride: 115 mmol/L — ABNORMAL HIGH (ref 98–111)
Creatinine, Ser: 0.82 mg/dL (ref 0.61–1.24)
GFR, Estimated: >60 mL/min (ref >60)
Glucose, Bld: 114 mg/dL — ABNORMAL HIGH (ref 70–99)
Potassium: 3.5 mmol/L (ref 3.5–5.1)
Sodium: 143 mmol/L (ref 135–145)
Total Bilirubin: 0.5 mg/dL (ref 0.3–1.2)
Total Protein: 6 g/dL — ABNORMAL LOW (ref 6.5–8.1)

## 2023-01-25 LAB — CBC
HCT: 28.4 % — ABNORMAL LOW (ref 39.0–52.0)
Hemoglobin: 9.4 g/dL — ABNORMAL LOW (ref 13.0–17.0)
MCH: 36.7 pg — ABNORMAL HIGH (ref 26.0–34.0)
MCHC: 33.1 g/dL (ref 30.0–36.0)
MCV: 110.9 fL — ABNORMAL HIGH (ref 80.0–100.0)
Platelets: 283 10*3/uL (ref 150–400)
RBC: 2.56 MIL/uL — ABNORMAL LOW (ref 4.22–5.81)
RDW: 14.1 % (ref 11.5–15.5)
WBC: 11.3 10*3/uL — ABNORMAL HIGH (ref 4.0–10.5)
nRBC: 0 % (ref 0.0–0.2)

## 2023-01-25 LAB — FOLATE: Folate: 11.3 ng/mL (ref >5.9)

## 2023-01-25 LAB — FERRITIN: Ferritin: 516 ng/mL — ABNORMAL HIGH (ref 24–336)

## 2023-01-25 LAB — POCT I-STAT 7, (LYTES, BLD GAS, ICA,H+H)
Acid-Base Excess: 0 mmol/L (ref 0.0–2.0)
Bicarbonate: 22.7 mmol/L (ref 20.0–28.0)
Calcium, Ion: 1.4 mmol/L (ref 1.15–1.40)
HCT: 28 % — ABNORMAL LOW (ref 39.0–52.0)
Hemoglobin: 9.5 g/dL — ABNORMAL LOW (ref 13.0–17.0)
O2 Saturation: 93 %
Patient temperature: 99.1
Potassium: 4 mmol/L (ref 3.5–5.1)
Sodium: 144 mmol/L (ref 135–145)
TCO2: 24 mmol/L (ref 22–32)
pCO2 arterial: 29.7 mmHg — ABNORMAL LOW (ref 32–48)
pH, Arterial: 7.491 — ABNORMAL HIGH (ref 7.35–7.45)
pO2, Arterial: 62 mmHg — ABNORMAL LOW (ref 83–108)

## 2023-01-25 LAB — MAGNESIUM: Magnesium: 2.3 mg/dL (ref 1.7–2.4)

## 2023-01-25 LAB — IRON AND TIBC
Iron: 44 ug/dL — ABNORMAL LOW (ref 45–182)
Saturation Ratios: 25 % (ref 17.9–39.5)
TIBC: 174 ug/dL — ABNORMAL LOW (ref 250–450)
UIBC: 130 ug/dL

## 2023-01-25 LAB — HEPARIN LEVEL (UNFRACTIONATED): Heparin Unfractionated: 0.47 IU/mL (ref 0.30–0.70)

## 2023-01-25 LAB — HEMOGLOBIN AND HEMATOCRIT, BLOOD
HCT: 29.4 % — ABNORMAL LOW (ref 39.0–52.0)
Hemoglobin: 9.7 g/dL — ABNORMAL LOW (ref 13.0–17.0)

## 2023-01-25 LAB — PHOSPHORUS: Phosphorus: 1.9 mg/dL — ABNORMAL LOW (ref 2.5–4.6)

## 2023-01-25 LAB — RETICULOCYTES
Immature Retic Fract: 24.3 % — ABNORMAL HIGH (ref 2.3–15.9)
RBC.: 2.59 MIL/uL — ABNORMAL LOW (ref 4.22–5.81)
Retic Count, Absolute: 53.9 10*3/uL (ref 19.0–186.0)
Retic Ct Pct: 2.1 % (ref 0.4–3.1)

## 2023-01-25 LAB — GLUCOSE, CAPILLARY
Glucose-Capillary: 80 mg/dL (ref 70–99)
Glucose-Capillary: 107 mg/dL — ABNORMAL HIGH (ref 70–99)
Glucose-Capillary: 85 mg/dL (ref 70–99)
Glucose-Capillary: 100 mg/dL — ABNORMAL HIGH (ref 70–99)
Glucose-Capillary: 89 mg/dL (ref 70–99)

## 2023-01-25 LAB — VITAMIN B12: Vitamin B-12: 402 pg/mL (ref 180–914)

## 2023-01-25 MED ORDER — MELATONIN 3 MG PO TABS
3.0000 mg | ORAL_TABLET | Freq: Every day | ORAL | Status: DC
  Administered 2023-01-25 – 2023-01-27 (×3): 3 mg
  Filled 2023-01-25 (×3): qty 1

## 2023-01-25 MED ORDER — HALOPERIDOL LACTATE 5 MG/ML IJ SOLN
3.0000 mg | Freq: Once | INTRAMUSCULAR | Status: AC | PRN
  Administered 2023-01-25: 3 mg via INTRAVENOUS
  Filled 2023-01-25: qty 1

## 2023-01-25 MED ORDER — HYDROMORPHONE HCL 1 MG/ML IJ SOLN
0.5000 mg | INTRAMUSCULAR | Status: DC | PRN
  Administered 2023-01-25 – 2023-01-29 (×2): 0.5 mg via INTRAVENOUS
  Filled 2023-01-25 (×2): qty 1

## 2023-01-25 MED ORDER — FUROSEMIDE 10 MG/ML IJ SOLN
20.0000 mg | Freq: Once | INTRAMUSCULAR | Status: AC
  Administered 2023-01-25: 20 mg via INTRAVENOUS
  Filled 2023-01-25: qty 2

## 2023-01-25 MED ORDER — POTASSIUM PHOSPHATES 15 MMOLE/5ML IV SOLN
30.0000 mmol | Freq: Once | INTRAVENOUS | Status: AC
  Administered 2023-01-25: 30 mmol via INTRAVENOUS
  Filled 2023-01-25: qty 10

## 2023-01-25 MED ORDER — HYDROMORPHONE HCL 1 MG/ML IJ SOLN: 0.5000 mg | INTRAMUSCULAR | Status: DC | PRN

## 2023-01-25 MED ORDER — MELATONIN 3 MG PO TABS: 3.0000 mg | ORAL_TABLET | Freq: Every day | ORAL | Status: DC

## 2023-01-25 NOTE — Progress Notes (Addendum)
[place] for IV heparin Indication: atrial fibrillation  No Known Allergies  Patient Measurements: Height: '5\' 11"'$  (180.3 cm) Weight: 49.8 kg (109 lb 12.6 oz) IBW/kg (Calculated) : 75.3 Heparin Dosing Weight: 58.7 kg (TBW)   Vital Signs: Temp: 98.1 F (36.7 C) (03/12 0700) Temp Source: Bladder (03/12 0600) BP: 134/74 (03/12 0700) Pulse Rate: 76 (03/12 0700)  Labs: Recent Labs    01/23/23 0120 01/23/23 1810 01/24/23 0213 01/24/23 1405 01/25/23 0130 01/25/23 0628  HGB 10.4*  --  9.2*  --  9.4*  --   HCT 32.4*  --  28.5*  --  28.4*  --   PLT 230  --  247  --  283  --   APTT 61* 76* 93*  --   --   --   HEPARINUNFRC 0.76* 0.74* 0.53 0.43  --  0.47  CREATININE 0.97  --  0.86  --  0.82  --      Estimated Creatinine Clearance: 43.9 mL/min (by C-G formula based on SCr of 0.82 mg/dL).   Medical History: Past Medical History:  Diagnosis Date   Acute on chronic combined systolic and diastolic CHF (congestive heart failure) ([place]) 03/19/2019   AKI (acute kidney injury) ([place]) 12/26/2014   Arthralgia 11/04/2014   Arthritis    "proabably"   Atypical mycobacterium infection    Cancer [facility])    family unaware of this hx on [id number]   Chronic systolic heart failure (HCC)    Decreased oral intake 12/26/2014   Decreased oral intake 12/26/2014   Depressed left ventricular ejection fraction 02/05/2020   Dilated cardiomyopathy ([place]) 03/19/2019   Ejection fraction 20 to 25% based on echocardiogram done by pulmonologist month ago.   DJD (degenerative joint disease) 10/07/2014   Dyspnea on exertion 03/19/2019   Essential hypertension 02/05/2020   GERD (gastroesophageal reflux disease)    Hammer toes of both feet 01/30/2018   History of thyroid cancer 12/16/2016   Hypercalcemia 12/26/2014   Hypothyroidism    Hypothyroidism associated with surgical procedure 10/07/2014   Lung mass    Myalgia 11/04/2014   Mycobacterium avium complex ([place])    Nail  dystrophy 01/30/2018   Occult malignancy (HCC)    PAF (paroxysmal atrial fibrillation) ([place]) 02/05/2020   Persistent atrial fibrillation ([place]) 03/19/2019   Chads 2 Vascor equals 3   PONV (postoperative nausea and vomiting)    Pre-ulcerative calluses 01/30/2018   Pseudoaneurysm following procedure ([place]) 04/16/2020   Sensorineural hearing loss (SNHL), bilateral 08/11/2018   Shingles    Toenail fungus 01/30/2018   Wears glasses     Assessment: 67 YOM with hx afib, on Eliquis PTA, last dose 2/29 PTA. Pt with R-scalp hematoma on 2/29, CT head negative for intracranial hemorrhage. Pharmacy consulted to start IV heparin while Eliquis is on hold.   Daily heparin level 0.47 therapeutic and stable. Hgb 9.4 low stable, PLT 283 wnl. Per RN, no issues with heparin infusion and no further bloody bowel movements overnight.   Goal of Therapy:  Heparin level 0.3-0.5 units/ml Monitor platelets by anticoagulation protocol: Yes   Plan:  Continue heparin at 1200 units/hr Monitor daily heparin level and CBC, s/sx bleeding F/u transition to Eliquis as appropriate    Eliseo Gum, PharmD PGY1 Pharmacy Resident   01/25/2023  7:53 AM   ADDENDUM:   Informed by RN of bloody bowel movement. Per MD, stopping heparin.   Eliseo Gum, PharmD PGY1 Pharmacy Resident   01/25/2023  1:05 PM

## 2023-01-25 NOTE — TOC Progression Note (Signed)
Transition of Care [facility]) - Progression Note    Patient Details  Name: Curtis Ramos MRN: [id number] Date of Birth: 28-Jul-1932  Transition of Care [facility]) CM/SW Contact  Cyndi Bender, RN Phone Number: 01/25/2023, 10:27 AM  Clinical Narrative:     Peer to Peer denied due to only being on HF nasal canula oxygen for 5 days. Insurance would be open to consider for LTAC if he still on HF Platter oxygen by the end of the week.  TOC following.   Expected Discharge Plan: Long Term Acute Care (LTAC) Barriers to Discharge: Continued Medical Work up  Expected Discharge Plan and Services   Discharge Planning Services: CM Consult Post Acute Care Choice: Long Term Acute Care (LTAC) Living arrangements for the past 2 months: Single Family Home                                       Social Determinants of Health (SDOH) Interventions SDOH Screenings   Food Insecurity: No Food Insecurity (01/18/2023)  Housing: Low Risk  (01/18/2023)  Transportation Needs: No Transportation Needs (01/18/2023)  Utilities: Not At Risk (01/18/2023)  Financial Resource Strain: Low Risk  (01/18/2023)  Tobacco Use: Low Risk  (01/13/2023)    Readmission Risk Interventions     No data to display

## 2023-01-25 NOTE — Progress Notes (Addendum)
[facility]                                                                               Curtis Ramos, is a 89 y.o. male, DOB - 17-Jan-1932, [id number] Admit date - 01/13/2023    Outpatient Primary MD for the patient is [name], [name], MD  LOS - 12  days    Brief summary   89 year old man with prior history of hypertension nonischemic cardiomyopathy and chronic systolic heart failure with left ventricle ejection fraction of 17%, atrial fibrillation on Eliquis, hypothyroidism, history of thyroid cancer, GERD presented to [name] ED on 2/29 after unwitnessed fall at work.  As per the PCCM notes patient was found down between the radiator and the wall unresponsive with contusion.  On arrival to ED patient became apneic and lost pulses and cardiac monitoring showed PEA arrest.  CPR administered for 9 minutes with epi x 4 and ROSC achieved.  Patient was intubated.  cardiology was consulted. CT of the chest abdomen and pelvis showed known displaced transverse fractures of the mid lower sternal body, acute fracture of the coastal cartilage of bilateral second through sixth ribs, acute fracture of the anterior second to 7 ribs.  He was admitted to [facility] service.  He was started on Unasyn for aspiration pneumonitis, later transitioned to cefepime for Pseudomonas VAP. He was extubated on 01/19/2023, and transferred to [facility] on 01/21/2023. ON 3/9 Patient became tachypneic and requiried BIPAP for 24 hours, transitioned to HF oxygen.  3/12 Patient weaned off oxygen and on RA with good sats. But overnight he became delirious and required Haldol.    Assessment & Plan    Assessment and Plan:   PEA arrest/ Cardiogenic shock:  Weaned off vasopressors.  Cardiology on board and managing heart failure.  Spironolactone added, 12.5 mg daily.  Renal function appears to be stable.    Acute hypoxic respiratory failure secondary to pneumonia and pulmonary edema in the setting of sternal  fractures and aspiration pneumonia Extubated on 01/19/2023 Currently on RA with sats in low 90's.  But on exam , he sound congested, will give him a dose of IV LASIX 20 mg once.   Septic shock secondary to pneumonia//COVID/ pseudomonas VAP Lactic acidosis resolved On IV antibiotics cefepime to complete the course.  Respiratory cultures show pseudomonas.  Blood cultures done and negative so far.    NICM/ chronic systolic heart failure:  Echo shows LVEF of 20%. The left ventricle has severely decreased function with regional wall motion abnormalities. No LV thrombus.  Cardiology on board. Getting PRN lasix and is also on spironolactone.    Chronic atrial fibrillation with frequent PVC's - rate high between 90 to 100/min with PVC's - on amiodarone 200 mg daily.  Holding heparin for BRBPR.    BRBPR  No external hemorrhoids evident. Baseleine hemoglobin around 11, dropped to 9 and has been stable since then. No more episodes of BRBPR in the last 48 hours.  GI consulted,not a candidate for any intervention at this time.  Another episode of Rectal bleeding, holding heparin for now. Recheck H&H now.    Hypothyroidism:  Int he setting of h.o thyroid cancer  Recommend checking thyroid panel in 2 to 4 weeks when acute illness improves.  Resume levothyroxine.    Hypernatremia Resolved with free water boluses.  Free water boluses, sodium improved from 150 to 148 to 147 to 145 to 143. Marland Kitchen     AKI due to shock:  Resolved.    Hypophosphatemia:  Replaced. Recheck in am.    Hypercalcemia:  Resolved.    Macrocytic anemia:  Anemia  panel will be sent.    Nutrition:  On cortrak with tube feeds.  Pt alert and awake  on 3/11 and requesting water, will request SLP eval.  Plan for FEES   Urinary retention:  S/p foley catheter placement on 01/21/23.  D/c foley and  Voiding trial today.     Elevated liver enzymes.  ? Congestion. Check US liver.      RN Pressure Injury  Documentation:    Malnutrition Type:  Nutrition Problem: Severe Malnutrition Etiology: chronic illness (CHF)   Malnutrition Characteristics:  Signs/Symptoms: severe fat depletion, severe muscle depletion   Nutrition Interventions:  Interventions: Refer to RD note for recommendations  Estimated body mass index is 15.31 kg/m as calculated from the following:   Height as of this encounter: '5\' 11"'$  (1.803 m).   Weight as of this encounter: 49.8 kg.  Code Status: full code.  DVT Prophylaxis:  SCDs Start: 01/13/23 1327   Level of Care: Level of care: Progressive Family Communication:None at bedside.   Disposition Plan:     Remains inpatient appropriate:  still on high flow oxygen.     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 Found between radiator and wall with R-sided head injury. Suspected cardiac etiology of unresponsiveness. On ED arrival, became pulseless (rhythm PEA). CPR x 9 min, Epi x 4 with ROSC. Intubated peri-arrest. CT Head, C-Spine, Chest/A/P completed (as above). Unasyn for ?aspiration. R axillary A-line, L subclavian CVC, ETT tube exchange + bronch.  3/3 - Extubated, back into respiratory failure, reintubated 3/3 - OG tube placement complications, completely replaced, still trouble with placement 3/4 - Started cefepime for respiratory culture showing abundant pseudomonas 3/7 extubated.  3/8 - Transferred to [facility] on 3/8 3/8 - GI consulted for BRBPR.  3/12 weaned off oxygen.     Consultants:   Cardiology PCCM Gastroenterology.  Antimicrobials:   Anti-infectives (From admission, onward)    Start     Dose/Rate Route Frequency Ordered Stop   01/17/23 1100  ceFEPIme (MAXIPIME) 2 g in sodium chloride 0.9 % 100 mL IVPB        2 g 200 mL/hr over 30 Minutes Intravenous Every 12 hours 01/17/23 1040 01/26/23 2359   01/16/23 1400  cefTRIAXone (ROCEPHIN) 2 g in sodium chloride 0.9  % 100 mL IVPB  Status:  Discontinued        2 g 200 mL/hr over 30 Minutes Intravenous Every 24 hours 01/16/23 1136 01/17/23 1028   01/15/23 0815  Ampicillin-Sulbactam (UNASYN) 3 g in sodium chloride 0.9 % 100 mL IVPB  Status:  Discontinued        3 g 200 mL/hr over 30 Minutes Intravenous Every 6 hours 01/15/23 0804 01/16/23 1136   01/13/23 1345  Ampicillin-Sulbactam (UNASYN) 3 g in sodium chloride 0.9 % 100 mL IVPB  Status:  Discontinued        3 g 200 mL/hr over 30 Minutes Intravenous Every 12 hours 01/13/23 1339 01/15/23 0804  Medications  Scheduled Meds:  amiodarone  200 mg Per Tube Daily   Chlorhexidine Gluconate Cloth  6 each Topical Daily   feeding supplement (PROSource TF20)  60 mL Per Tube Daily   free water  200 mL Per Tube Q4H   levothyroxine  175 mcg Per Tube Daily   lidocaine  1 patch Transdermal Q24H   melatonin  3 mg Per Tube QHS   mouth rinse  15 mL Mouth Rinse 4 times per day   pantoprazole (PROTONIX) IV  40 mg Intravenous Q12H   spironolactone  12.5 mg Per Tube Daily   Continuous Infusions:  sodium chloride 10 mL/hr at 01/25/23 0900   ceFEPime (MAXIPIME) IV Stopped (01/24/23 2242)   feeding supplement (OSMOLITE 1.5 CAL) 60 mL/hr at 01/25/23 0900   heparin 1,200 Units/hr (01/25/23 0900)   potassium PHOSPHATE IVPB (in mmol)     PRN Meds:.acetaminophen, docusate, HYDROmorphone (DILAUDID) injection, lip balm, ondansetron (ZOFRAN) IV, mouth rinse, polyethylene glycol, senna-docusate    Subjective:   Markice Halliwell was seen and examined today.  More confused this morning.   Objective:   Vitals:   01/25/23 0700 01/25/23 0758 01/25/23 0800 01/25/23 0900  BP: 134/74  131/71 136/63  Pulse: 76  72 62  Resp: (!) 27  (!) 25 (!) 21  Temp: 98.1 F (36.7 C) 98.2 F (36.8 C) 98.2 F (36.8 C) 98.4 F (36.9 C)  TempSrc:  Bladder    SpO2: 100%  100% 97%  Weight:      Height:        Intake/Output Summary (Last 24 hours) at 01/25/2023 0913 Last data filed at  01/25/2023 0900 Gross per 24 hour  Intake 2894.58 ml  Output 2790 ml  Net 104.58 ml    Filed Weights   01/23/23 0500 01/24/23 0300 01/25/23 0439  Weight: 57.9 kg 52.1 kg 49.8 kg     Exam General exam: Ill appearing gentleman, not in distress. On RA.  Respiratory system: diminished air entry , on RA.  Cardiovascular system: S1 & S2 heard, RRR. No JVD, No pedal edema. Gastrointestinal system: Abdomen is nondistended, soft and nontender. Central nervous system: Alert and oriented TO SELF ONLY.  Extremities: no pedal edema Skin: No rashes,  Psychiatry: JUnable to assess.         Data Reviewed:  I have personally reviewed following labs and imaging studies   CBC Lab Results  Component Value Date   WBC 11.3 (H) 01/25/2023   RBC 2.56 (L) 01/25/2023   HGB 9.4 (L) 01/25/2023   HCT 28.4 (L) 01/25/2023   MCV 110.9 (H) 01/25/2023   MCH 36.7 (H) 01/25/2023   PLT 283 01/25/2023   MCHC 33.1 01/25/2023   RDW 14.1 [id number]     Last metabolic panel Lab Results  Component Value Date   NA 143 01/25/2023   K 3.5 01/25/2023   CL 115 (H) 01/25/2023   CO2 24 01/25/2023   BUN 28 (H) 01/25/2023   CREATININE 0.82 01/25/2023   GLUCOSE 114 (H) 01/25/2023   GFRNONAA >60 01/25/2023   GFRAA 76 10/15/2020   CALCIUM 9.6 01/25/2023   PHOS 1.9 (L) 01/25/2023   PROT 6.0 (L) 01/25/2023   ALBUMIN 2.1 (L) 01/25/2023   LABGLOB 2.6 04/16/2020   AGRATIO 1.7 04/16/2020   BILITOT 0.5 01/25/2023   ALKPHOS 118 01/25/2023   AST 105 (H) 01/25/2023   ALT 135 (H) 01/25/2023   ANIONGAP 4 (L) 01/25/2023    CBG (last 3)  Recent Labs  01/24/23 2313 01/25/23 0309 01/25/23 0752  GLUCAP 107* 80 107*       Coagulation Profile: No results for input(s): "INR", "PROTIME" in the last 168 hours.   Radiology Studies: No results found.     Hosie Poisson M.D. [facility] 01/25/2023, 9:13 AM  Available via Epic secure chat 7am-7pm After 7 pm, please refer to night coverage  provider listed on amion.

## 2023-01-25 NOTE — Progress Notes (Signed)
Patient increasingly agitated and disoriented throughout the night. Patient removed IV, trying to climb out of bed verbalizing visual hallucinations. Yelling and attempts to be physically aggressive with staff when they try to help him back into bed. Patient complaints of back pain. PRN dilaudid given. Patient still agitated. MD contacted, new order for PRN haldol given. Patient resting with eyes closed, respiratory rate even and unlabored. Vital as charted.

## 2023-01-25 NOTE — Progress Notes (Signed)
PT Cancellation Note  Patient Details Name: Curtis Ramos MRN: [id number] DOB: Aug 12, 1932   Cancelled Treatment:    Reason Eval/Treat Not Completed: Fatigue/lethargy limiting ability to participate - will check back tomorrow per RN request.   Stacie Glaze, PT DPT Acute Rehabilitation Services Pager [phone]  Office [phone]    Louis Matte 01/25/2023, 2:22 PM

## 2023-01-25 NOTE — Progress Notes (Signed)
SLP Cancellation Note  Patient Details Name: Curtis Ramos MRN: [id number] DOB: 01-09-1932   Cancelled treatment:       Reason Eval/Treat Not Completed: Fatigue/lethargy limiting ability to participate. Pt given Haldol in am, groggy. Will f/u later today for potential MBS/FEES readiness today or tomorrow.    Polly Barner, [name] 01/25/2023, 9:52 AM

## 2023-01-25 NOTE — Progress Notes (Signed)
                                                                                                                                                                                                          Palliative Medicine Progress Note   Patient Name: Curtis Ramos       Date: 01/25/2023 DOB: October 25, 1932  Age: 89 y.o. MRN#: [id number] Attending Physician: Hosie Poisson, MD Primary Care Physician: Leonides Sake, MD Admit Date: 01/13/2023  Reason for Consultation/Follow-up: {Reason for Consult:23484}  HPI/Patient Profile: Palliative Care consult requested for goals of care discussion in this 89 y.o. male  with past medical history of atrial fibrillation (Eliquis), GERD, hypothyroidism, thyroid cancer, hypertension, systolic heart failure with severe LV dilation, EF 17%, LBBB as managed by Dr. Haroldine Laws, [place].  He was admitted on 01/13/2023 after being found down at work where he fell between the radiator and the wall.  At that time he was unresponsive.  Patient suffered PEA arrest undergoing CPR for 9 minutes before ROSC while in the ED.  Extubated on 3/3 but decompensated and required reintubation. Extubated again on 3/6.    Subjective: ***  Objective:  Physical Exam          Vital Signs: BP 132/79   Pulse 72   Temp 99.1 F (37.3 C)   Resp (!) 22   Ht 5\' 11"  (1.803 m)   Wt 49.8 kg   SpO2 96%   BMI 15.31 kg/m  SpO2: SpO2: 96 %     Palliative Assessment/Data: ***     Palliative Medicine Assessment & Plan   Assessment: Principal Problem:   Cardiac arrest (HCC) Active Problems:   Septic shock (HCC)   Acute respiratory failure with hypoxia (HCC)   Protein-calorie malnutrition, severe    Recommendations/Plan: ***  Goals of Care and Additional Recommendations: Limitations on Scope of Treatment: {Recommended Scope and Preferences:21019}  Code Status:   Prognosis:  {Palliative Care Prognosis:23504}  Discharge Planning: {Palliative dispostion:23505}  Care plan was  discussed with ***  Thank you for allowing the Palliative Medicine Team to assist in the care of this patient.   ***   Lavena Bullion, NP   Please contact Palliative Medicine Team phone at [phone] for questions and concerns.  For individual providers, please see AMION.

## 2023-01-25 NOTE — Plan of Care (Signed)

## 2023-01-26 ENCOUNTER — Inpatient Hospital Stay (HOSPITAL_COMMUNITY): Payer: Medicare Other

## 2023-01-26 DIAGNOSIS — R410 Disorientation, unspecified: Secondary | ICD-10-CM

## 2023-01-26 DIAGNOSIS — K921 Melena: Secondary | ICD-10-CM

## 2023-01-26 DIAGNOSIS — J95851 Ventilator associated pneumonia: Secondary | ICD-10-CM

## 2023-01-26 DIAGNOSIS — E43 Unspecified severe protein-calorie malnutrition: Secondary | ICD-10-CM | POA: Diagnosis not present

## 2023-01-26 DIAGNOSIS — A419 Sepsis, unspecified organism: Secondary | ICD-10-CM | POA: Diagnosis not present

## 2023-01-26 DIAGNOSIS — I469 Cardiac arrest, cause unspecified: Secondary | ICD-10-CM | POA: Diagnosis not present

## 2023-01-26 HISTORY — DX: Melena: K92.1

## 2023-01-26 HISTORY — DX: Ventilator associated pneumonia: J95.851

## 2023-01-26 HISTORY — DX: Disorientation, unspecified: R41.0

## 2023-01-26 LAB — GLUCOSE, CAPILLARY
Glucose-Capillary: 107 mg/dL — ABNORMAL HIGH (ref 70–99)
Glucose-Capillary: 111 mg/dL — ABNORMAL HIGH (ref 70–99)
Glucose-Capillary: 116 mg/dL — ABNORMAL HIGH (ref 70–99)
Glucose-Capillary: 116 mg/dL — ABNORMAL HIGH (ref 70–99)
Glucose-Capillary: 120 mg/dL — ABNORMAL HIGH (ref 70–99)
Glucose-Capillary: 130 mg/dL — ABNORMAL HIGH (ref 70–99)

## 2023-01-26 LAB — HEPATIC FUNCTION PANEL
ALT: 117 U/L — ABNORMAL HIGH (ref 0–44)
AST: 53 U/L — ABNORMAL HIGH (ref 15–41)
Albumin: 2.2 g/dL — ABNORMAL LOW (ref 3.5–5.0)
Alkaline Phosphatase: 125 U/L (ref 38–126)
Bilirubin, Direct: 0.1 mg/dL (ref 0.0–0.2)
Indirect Bilirubin: 0.2 mg/dL — ABNORMAL LOW (ref 0.3–0.9)
Total Bilirubin: 0.3 mg/dL (ref 0.3–1.2)
Total Protein: 6.6 g/dL (ref 6.5–8.1)

## 2023-01-26 LAB — CBC
HCT: 30.7 % — ABNORMAL LOW (ref 39.0–52.0)
Hemoglobin: 10.4 g/dL — ABNORMAL LOW (ref 13.0–17.0)
MCH: 37 pg — ABNORMAL HIGH (ref 26.0–34.0)
MCHC: 33.9 g/dL (ref 30.0–36.0)
MCV: 109.3 fL — ABNORMAL HIGH (ref 80.0–100.0)
Platelets: 333 10*3/uL (ref 150–400)
RBC: 2.81 MIL/uL — ABNORMAL LOW (ref 4.22–5.81)
RDW: 14 % (ref 11.5–15.5)
WBC: 11 10*3/uL — ABNORMAL HIGH (ref 4.0–10.5)
nRBC: 0 % (ref 0.0–0.2)

## 2023-01-26 LAB — BASIC METABOLIC PANEL
Anion gap: 8 (ref 5–15)
BUN: 30 mg/dL — ABNORMAL HIGH (ref 8–23)
CO2: 21 mmol/L — ABNORMAL LOW (ref 22–32)
Calcium: 10 mg/dL (ref 8.9–10.3)
Chloride: 114 mmol/L — ABNORMAL HIGH (ref 98–111)
Creatinine, Ser: 0.92 mg/dL (ref 0.61–1.24)
GFR, Estimated: >60 mL/min (ref >60)
Glucose, Bld: 128 mg/dL — ABNORMAL HIGH (ref 70–99)
Potassium: 3.9 mmol/L (ref 3.5–5.1)
Sodium: 143 mmol/L (ref 135–145)

## 2023-01-26 LAB — PHOSPHORUS: Phosphorus: 3.2 mg/dL (ref 2.5–4.6)

## 2023-01-26 NOTE — Progress Notes (Signed)
Modified Barium Swallow Study  Patient Details  Name: Curtis Ramos MRN: [id number] Date of Birth: 04/12/1932  Today's Date: 01/26/2023  Modified Barium Swallow completed.  Full report located under Chart Review in the Imaging Section.  History of Present Illness 89 year old man who presented to [facility] ED 2/29 via EMS after unwitnessed fall at work, patient did not have any complaints prior to fall. He was found down between a radiator and the wall, unresponsive with contusion and deformities to R side of his head. Initially with tachycardic with EMS, then bradycardic. ST elevation noted on EKG and Code STEMI was called, later called off as ST elevation had resolved. Patient lost pulses while in ED and became apneic with cardiac monitoring showing PEA arrest. CPR administered x 9 minutes with Epi x 4 and ROSC, patient never had a shockable rhythm. Intubated peri-arrest 2/28-3/3 then reintubated until 3/6.Marland KitchenPMHx significant for HTN, HFrEF with NICM (cMRI 12/2022 with severe LV dilation, EF 17%, LBBB, followed by Dr. Haroldine Laws), AFib (on Eliquis), MAI infection, GERD, hypothyroidism, history of thyroid CA.   Clinical Impression Pt demonstrates decreased strength for airway protection and coughing. Pt likely has a mild baseline dysphagia per his son's report. Currently pt has mild to moderate pharyngeal residue with most textures due to decreased base of tongue retraction, epiglottic deflection and pharyngeal peristalsis. There is additionally incomplete laryngeal clsoure and repeated aspiration during and after the swallow. Pt senses pentrate and aspirate and tries to clear, but efforts are weak and likely impacted by the effects of prolonged CPR and deconditioning. Pt is at risk of aspiration of all textures at this time. Recommend ptremain NPO but be offered ice and SLP will start RMT to improve efficacy of cough. Factors that may increase risk of adverse event in presence of aspiration ([place]  2021): Weak cough;Presence of tubes (ETT, trach, NG, etc.);Frail or deconditioned  Swallow Evaluation Recommendations Recommendations: Ice chips PRN after oral care Oral care recommendations: Oral care QID (4x/day)    Herbie Baltimore, MA CCC-SLP  Acute Rehabilitation Services Secure Chat Preferred Office [phone]   Lynann Beaver 01/26/2023,2:48 PM

## 2023-01-26 NOTE — Plan of Care (Signed)
  Problem: Education: Goal: Knowledge of General Education information will improve Description: Including pain rating scale, medication(s)/side effects and non-pharmacologic comfort measures Outcome: Progressing   Problem: Health Behavior/Discharge Planning: Goal: Ability to manage health-related needs will improve Outcome: Progressing   Problem: Clinical Measurements: Goal: Respiratory complications will improve Outcome: Progressing   Problem: Clinical Measurements: Goal: Cardiovascular complication will be avoided Outcome: Progressing   Problem: Activity: Goal: Risk for activity intolerance will decrease Outcome: Progressing   Problem: Nutrition: Goal: Adequate nutrition will be maintained Outcome: Progressing   Problem: Coping: Goal: Level of anxiety will decrease Outcome: Progressing   Problem: Pain Managment: Goal: General experience of comfort will improve Outcome: Progressing   Problem: Elimination: Goal: Will not experience complications related to bowel motility Outcome: Progressing   Problem: Skin Integrity: Goal: Risk for impaired skin integrity will decrease Outcome: Progressing

## 2023-01-26 NOTE — TOC Initial Note (Signed)
Transition of Care [facility]) - Initial/Assessment Note    Patient Details  Name: Curtis Ramos MRN: [id number] Date of Birth: [birth year]  [facility] CM/SW Contact:    Milas Gain, [place] Phone Number: 01/26/2023, 4:44 PM  Clinical Narrative:                  CSW received consult for SNF as back up plan to CIR for patient.CSW spoke with patient and patients son [name] at bedside regarding dc plans. Patient agreeable to CSW faxing patient out for SNF as back up plan to [place] and [place] area. CSW informed patient and patients son. CSW following to fax patient out for SNF closer to patient being medically ready. Patient currently has cortrak. All questions answered. No further questions reported at this time.  Expected Discharge Plan: Skilled Nursing Facility Barriers to Discharge: Continued Medical Work up   Patient Goals and CMS Choice Patient states their goals for this hospitalization and ongoing recovery are:: SNF CMS Medicare.gov Compare Post Acute Care list provided to:: Patient Represenative (must comment) Joya Martyr son) Choice offered to / list presented to : Adult Children      Expected Discharge Plan and Services In-house Referral: Clinical Social Work Discharge Planning Services: CM Consult Post Acute Care Choice: Long Term Acute Care (LTAC) Living arrangements for the past 2 months: Single Family Home                                      Prior Living Arrangements/Services Living arrangements for the past 2 months: Single Family Home Lives with:: Self Patient language and need for interpreter reviewed:: Yes Do you feel safe going back to the place where you live?: No   SNF  Need for Family Participation in Patient Care: Yes (Comment) Care giver support system in place?: Yes (comment)   Criminal Activity/Legal Involvement Pertinent to Current Situation/Hospitalization: No - Comment as needed  Activities of Daily Living Home Assistive  Devices/Equipment: None ADL Screening (condition at time of admission) Patient's cognitive ability adequate to safely complete daily activities?: Yes Is the patient deaf or have difficulty hearing?: Yes Does the patient have difficulty seeing, even when wearing glasses/contacts?: Yes Does the patient have difficulty concentrating, remembering, or making decisions?: No Patient able to express need for assistance with ADLs?: Yes Does the patient have difficulty dressing or bathing?: Yes Independently performs ADLs?: No Communication: Independent Dressing (OT): Dependent Is this a change from baseline?: Change from baseline, expected to last >3 days Grooming: Dependent Is this a change from baseline?: Change from baseline, expected to last >3 days Feeding: Dependent (tube feeding) Is this a change from baseline?: Change from baseline, expected to last >3 days Bathing: Dependent Is this a change from baseline?: Change from baseline, expected to last >3 days Toileting: Dependent Is this a change from baseline?: Change from baseline, expected to last >3days In/Out Bed: Dependent Is this a change from baseline?: Change from baseline, expected to last >3 days Walks in Home: Independent Does the patient have difficulty walking or climbing stairs?: Yes Weakness of Legs: Both Weakness of Arms/Hands: None  Permission Sought/Granted Permission sought to share information with : Case Manager, Family Supports, Customer service manager    Share Information with NAME: [name]  Permission granted to share info w AGENCY: SNF  Permission granted to share info w Relationship: son  Permission granted to share info w Contact  Information: [name] [id number]  Emotional Assessment Appearance:: Appears stated age Attitude/Demeanor/Rapport: Gracious Affect (typically observed): Calm Orientation: : Oriented to Self, Oriented to Place, Oriented to  Time, Oriented to Situation Alcohol / Substance Use:  Not Applicable Psych Involvement: No (comment)  Admission diagnosis:  Cardiac arrest [facility]) [I46.9] Patient Active Problem List   Diagnosis Date Noted   Hematochezia 01/26/2023   VAP (ventilator-associated pneumonia) ([place]) 01/26/2023   Delirium 01/26/2023   Protein-calorie malnutrition, severe 01/14/2023   Cardiac arrest ([place]) 01/13/2023   Septic shock ([place]) 01/13/2023   Acute respiratory failure with hypoxia ([place]) 01/13/2023   Permanent atrial fibrillation ([place]) 10/15/2020   Wears glasses    Shingles    PONV (postoperative nausea and vomiting)    Mycobacterium avium complex (HCC)    Lung mass    Hypothyroidism    GERD (gastroesophageal reflux disease)    Cancer (HCC)    Arthritis    Pseudoaneurysm following procedure ([place]) [id number]   Chronic systolic CHF (congestive heart failure) (HCC)    PAF (paroxysmal atrial fibrillation) ([place]) 02/05/2020   Depressed left ventricular ejection fraction 02/05/2020   Essential hypertension 02/05/2020   Dilated cardiomyopathy ([place]) 03/19/2019   Acute on chronic combined systolic and diastolic CHF (congestive heart failure) ([place]) 03/19/2019   Dyspnea on exertion 03/19/2019   Persistent atrial fibrillation ([place]) 03/19/2019   Sensorineural hearing loss (SNHL), bilateral 08/11/2018   Hammer toes of both feet 01/30/2018   Nail dystrophy 01/30/2018   Pre-ulcerative calluses 01/30/2018   Toenail fungus 01/30/2018   History of thyroid cancer 12/16/2016   Occult malignancy ([place])    Hypercalcemia 12/26/2014   AKI (acute kidney injury) ([place]) 12/26/2014   Decreased oral intake 12/26/2014   Myalgia 11/04/2014   Arthralgia 11/04/2014   Hypothyroidism associated with surgical procedure 10/07/2014   DJD (degenerative joint disease) 10/07/2014   Atypical mycobacterium infection    PCP:  Leonides Sake, MD Pharmacy:   [place], [place] [address] [place] [address] Phone: [phone]  Fax: [phone]  [id number] [place], [email or web address]: [phone]: [phone]    Social Determinants of Health (SDOH) Social History: SDOH Screenings   Food Insecurity: No Food Insecurity (01/18/2023)  Housing: Low Risk  (01/18/2023)  Transportation Needs: No Transportation Needs (01/18/2023)  Utilities: Not At Risk (01/18/2023)  Financial Resource Strain: Low Risk  (01/18/2023)  Tobacco Use: Low Risk  (01/13/2023)   SDOH Interventions: Food Insecurity Interventions: Intervention Not Indicated Housing Interventions: Intervention Not Indicated Transportation Interventions: Intervention Not Indicated Utilities Interventions: Intervention Not Indicated Financial Strain Interventions: Intervention Not Indicated   Readmission Risk Interventions     No data to display

## 2023-01-26 NOTE — Progress Notes (Signed)
Physical Therapy Treatment Patient Details Name: Curtis Ramos MRN: [id number] DOB: 1932-06-07 Today's Date: 01/26/2023   History of Present Illness Pt is 89 year old presented [facility] on  01/13/23 after unwitnessed fall at work. In ED pt had PEA arrest with CPR x 9 minutes and intubated. Pt with respiratory failure and septic shock due to pulmonary edema, atelectasis post CPR rib/sternal fxs, PNA, and acute on chronic heart failure. Failed extubation 3/3 and reintubated until extubated 3/6. PMH - HTN, heart failure, afib, thyroid CA    PT Comments    Pt tolerated treatment well today. Pt was able to stand with +2 Min A for pericare and then able to sit in recliner. No change in DC/DME recs at this time as pt remains highly motivated and could likely tolerate 3 hours of PT once medically stable. PT will continue to follow    Recommendations for follow up therapy are one component of a multi-disciplinary discharge planning process, led by the attending physician.  Recommendations may be updated based on patient status, additional functional criteria and insurance authorization.  Follow Up Recommendations  Acute inpatient rehab (3hours/day)     Assistance Recommended at Discharge Frequent or constant Supervision/Assistance  Patient can return home with the following A lot of help with walking and/or transfers;A lot of help with bathing/dressing/bathroom;Assistance with cooking/housework;Assist for transportation;Help with stairs or ramp for entrance   Equipment Recommendations  None recommended by PT (Per accepting facility)    Recommendations for Other Services       Precautions / Restrictions Precautions Precautions: Fall Precaution Comments: Tube feed Restrictions Weight Bearing Restrictions: No     Mobility  Bed Mobility Overal bed mobility: Needs Assistance Bed Mobility: Supine to Sit     Supine to sit: +2 for physical assistance, Mod assist           Transfers Overall transfer level: Needs assistance Equipment used: Rolling walker (2 wheels) Transfers: Sit to/from Stand, Bed to chair/wheelchair/BSC Sit to Stand: +2 physical assistance, Min assist, Mod assist (x2) Stand pivot transfers: +2 physical assistance, Mod assist         General transfer comment: Pt required cues for upright posture. Pt started off requiring Min A but required Mod A once fatigued. Due to fatigue chair was brought behind patient.    Ambulation/Gait                   Stairs             Wheelchair Mobility    Modified Rankin (Stroke Patients Only)       Balance Overall balance assessment: Needs assistance Sitting-balance support: Feet supported, No upper extremity supported, Bilateral upper extremity supported Sitting balance-Leahy Scale: Poor     Standing balance support: Bilateral upper extremity supported, During functional activity Standing balance-Leahy Scale: Poor                              Cognition Arousal/Alertness: Awake/alert Behavior During Therapy: WFL for tasks assessed/performed Overall Cognitive Status: Within Functional Limits for tasks assessed                                          Exercises      General Comments General comments (skin integrity, edema, etc.): VSS on RA      Pertinent Vitals/Pain Pain  Assessment Pain Assessment: No/denies pain    Home Living                          Prior Function            PT Goals (current goals can now be found in the care plan section) Progress towards PT goals: Progressing toward goals    Frequency    Min 3X/week      PT Plan Current plan remains appropriate    Co-evaluation              AM-PAC PT "6 Clicks" Mobility   Outcome Measure  Help needed turning from your back to your side while in a flat bed without using bedrails?: A Lot Help needed moving from lying on your back to sitting on  the side of a flat bed without using bedrails?: A Lot Help needed moving to and from a bed to a chair (including a wheelchair)?: A Lot Help needed standing up from a chair using your arms (e.g., wheelchair or bedside chair)?: A Lot Help needed to walk in hospital room?: Total Help needed climbing 3-5 steps with a railing? : Total 6 Click Score: 10    End of Session Equipment Utilized During Treatment: Gait belt Activity Tolerance: Patient limited by fatigue Patient left: with call bell/phone within reach;in chair;with chair alarm set;with family/visitor present Nurse Communication: Mobility status PT Visit Diagnosis: Unsteadiness on feet (R26.81);Muscle weakness (generalized) (M62.81)     Time: [id number] PT Time Calculation (min) (ACUTE ONLY): 29 min  Charges:  $Therapeutic Activity: 23-37 mins                     Shelby Mattocks, PT, DPT Acute Rehab Services [id number]    Viann Shove 01/26/2023, 4:28 PM

## 2023-01-26 NOTE — Progress Notes (Signed)
PROGRESS NOTE  Curtis Ramos [id number] DOB: [birth year]   PCP: Leonides Sake, MD  Patient is from: Home.  DOA: 01/13/2023 LOS: 13  Chief complaints No chief complaint on file.    Brief Narrative / Interim history: 89 year old M with PMH of systolic CHF, NICM, A-fib on Eliquis, HTN, hypothyroidism, thyroid cancer and GERD presented to ED on 2/29 after unwitnessed fall at work.  As per the PCCM notes patient was found down between the radiator and the wall unresponsive with contusion.  On arrival to ED patient became apneic and lost pulses and cardiac monitoring showed PEA arrest.  CPR administered for 9 minutes with epi x 4 and ROSC achieved.  Patient was intubated and admitted to ICU. cardiology was consulted. CT of the chest abdomen and pelvis showed known displaced transverse fractures of the mid lower sternal body, acute fracture of the coastal cartilage of bilateral second through sixth ribs, acute fracture of the anterior second to 7 ribs.  He was started on Unasyn for aspiration pneumonitis, later transitioned to cefepime for Pseudomonas VAP. He was extubated on 01/19/2023, and transferred to [facility] on 01/21/2023. On 3/9 Patient became tachypneic and requiried BIPAP for 24 hours, transitioned to HF oxygen.  Patient was weaned off oxygen to room air on 3/12.  Hospital course significant for delirium.  Cardiology and palliative medicine following.    Subjective: Seen and examined earlier this morning.  No major events overnight of this morning.  No complaints this morning.  Objective: Vitals:   01/26/23 0900 01/26/23 1000 01/26/23 1100 01/26/23 1206  BP: 122/67 106/79 127/66 123/62  Pulse: 73 74 66 74  Resp: 19 (!) 22 (!) 22 20  Temp:    98.7 F (37.1 C)  TempSrc:    Axillary  SpO2: 100% 98% 100% 99%  Weight:      Height:        Examination:  GENERAL: Appears frail.  No apparent distress. HEENT: MMM.  On TF via cortrack NECK: Supple.  No apparent JVD.  RESP:  No IWOB.   Fair aeration bilaterally. CVS:  RRR. Heart sounds normal.  ABD/GI/GU: BS+. Abd soft, NTND.  MSK/EXT:  Moves extremities. No apparent deformity. No edema.  SKIN: no apparent skin lesion or wound NEURO: Awake, alert and oriented x 4 except date and year.  No apparent focal neuro deficit. PSYCH: Calm. Normal affect.   Procedures:  Intubation and mechanical ventilation  Microbiology summarized: 3/3-respiratory culture with Pseudomonas aeruginosa. 3/9-blood cultures NGTD  Assessment and plan: Principal Problem:   Cardiac arrest [facility] Active Problems:   Septic shock (HCC)   Acute respiratory failure with hypoxia (HCC)   Protein-calorie malnutrition, severe  PEA arrest/ Cardiogenic shock: Weaned off vasopressors.  -Management of CHF as below   NICM/Chronic systolic heart failure: TTE with LVEF of 20%.  Appears euvolemic on exam.  On room air. -Cardiology on board-Lasix as needed -GDMT-Aldactone  Acute respiratory failure with hypoxia: Multifactorial-PEA arrest, sternal and rib fractures and aspiration pneumonia. -Extubated on 3/6.  Currently on room air. -Pulmonary toilet  Septic shock secondary to pneumonia/COVID/ pseudomonas VAP: Resolved.  Blood cultures NGTD. -Completing course of IV cefepime today home on 3/13.   Chronic atrial fibrillation with frequent PVC's -On amiodarone 200 mg daily.  -Not on anticoagulation due to hematochezia.   Hematochezia: Reportedly had intermittent hematochezia and anticoagulation discontinued.  Not a candidate for intervention or procedure per GI.  H&H stable now.  Anemia panel suggests anemia of chronic disease. Recent Labs  01/21/23 1243 01/21/23 2034 01/22/23 0418 01/22/23 1019 01/23/23 0120 01/24/23 0213 01/25/23 0130 01/25/23 1325 01/25/23 1347 01/26/23 0147  HGB 10.2* 11.4* 11.4* 10.5* 10.4* 9.2* 9.4* 9.7* 9.5* 10.4*  -Continue monitoring   Hypothyroidism/history of thyroid cancer:  -Continue home Synthroid -Check TSH  outpatient  Hypernatremia/hypophosphatemia/hypocalcemia: Resolved.   AKI due to shock: Resolved.   Urinary retention: Seems to have resolved.  Passed voiding trial.  Elevated liver enzymes: Could be ischemic or congestive. -Recheck  Dysphagia: -N.p.o. and tube feed. -SLP following  Delirium -Delirium precaution   Severe malnutrition Body mass index is 18.54 kg/m. Nutrition Problem: Severe Malnutrition Etiology: chronic illness (CHF) Signs/Symptoms: severe fat depletion, severe muscle depletion Interventions: Refer to RD note for recommendations   DVT prophylaxis:  SCDs Start: 01/13/23 1327  Code Status: Full code Family Communication: None at bedside Level of care: Progressive Status is: Inpatient Remains inpatient appropriate because: Safe disposition   Final disposition: LTAC Consultants:  Cardiology GI Pulmonology  35 minutes with more than 50% spent in reviewing records, counseling patient/family and coordinating care.   Sch Meds:  Scheduled Meds:  amiodarone  200 mg Per Tube Daily   feeding supplement (PROSource TF20)  60 mL Per Tube Daily   free water  200 mL Per Tube Q4H   levothyroxine  175 mcg Per Tube Daily   lidocaine  1 patch Transdermal Q24H   melatonin  3 mg Per Tube QHS   mouth rinse  15 mL Mouth Rinse 4 times per day   pantoprazole (PROTONIX) IV  40 mg Intravenous Q12H   spironolactone  12.5 mg Per Tube Daily   Continuous Infusions:  sodium chloride Stopped (01/25/23 1632)   ceFEPime (MAXIPIME) IV 2 g (01/26/23 1151)   feeding supplement (OSMOLITE 1.5 CAL) 1,000 mL (01/25/23 2220)   PRN Meds:.acetaminophen, docusate, HYDROmorphone (DILAUDID) injection, lip balm, ondansetron (ZOFRAN) IV, mouth rinse, polyethylene glycol, senna-docusate  Antimicrobials: Anti-infectives (From admission, onward)    Start     Dose/Rate Route Frequency Ordered Stop   01/17/23 1100  ceFEPIme (MAXIPIME) 2 g in sodium chloride 0.9 % 100 mL IVPB        2  g 200 mL/hr over 30 Minutes Intravenous Every 12 hours 01/17/23 1040 01/26/23 2359   01/16/23 1400  cefTRIAXone (ROCEPHIN) 2 g in sodium chloride 0.9 % 100 mL IVPB  Status:  Discontinued        2 g 200 mL/hr over 30 Minutes Intravenous Every 24 hours 01/16/23 1136 01/17/23 1028   01/15/23 0815  Ampicillin-Sulbactam (UNASYN) 3 g in sodium chloride 0.9 % 100 mL IVPB  Status:  Discontinued        3 g 200 mL/hr over 30 Minutes Intravenous Every 6 hours 01/15/23 0804 01/16/23 1136   01/13/23 1345  Ampicillin-Sulbactam (UNASYN) 3 g in sodium chloride 0.9 % 100 mL IVPB  Status:  Discontinued        3 g 200 mL/hr over 30 Minutes Intravenous Every 12 hours 01/13/23 1339 01/15/23 0804        I have personally reviewed the following labs and images: CBC: Recent Labs  Lab 01/22/23 0418 01/22/23 1019 01/23/23 0120 01/24/23 0213 01/25/23 0130 01/25/23 1325 01/25/23 1347 01/26/23 0147  WBC 15.6*  --  12.9* 11.5* 11.3*  --   --  11.0*  HGB 11.4*   < > 10.4* 9.2* 9.4* 9.7* 9.5* 10.4*  HCT 33.4*   < > 32.4* 28.5* 28.4* 29.4* 28.0* 30.7*  MCV 109.5*  --  112.1*  113.1* 110.9*  --   --  109.3*  PLT 223  --  230 247 283  --   --  333   < > = values in this interval not displayed.   BMP &GFR Recent Labs  Lab 01/20/23 2214 01/20/23 2329 01/22/23 0418 01/22/23 1019 01/22/23 1651 01/23/23 0120 01/24/23 0213 01/25/23 0130 01/25/23 1347 01/26/23 0147  NA 147*   < > 148*   < >  --  147* 145 143 144 143  K 3.9   < > 3.4*   < >  --  3.7 3.8 3.5 4.0 3.9  CL 117*   < > 115*  --   --  117* 116* 115*  --  114*  CO2 22   < > 23  --   --  '24 23 24  '$ --  21*  GLUCOSE 146*   < > 123*  --   --  116* 120* 114*  --  128*  BUN 38*   < > 36*  --   --  36* 32* 28*  --  30*  CREATININE 0.98   < > 1.02  --   --  0.97 0.86 0.82  --  0.92  CALCIUM 11.0*   < > 9.9  --   --  9.5 9.6 9.6  --  10.0  MG 2.7*  --   --   --  2.5* 2.5* 2.4 2.3  --   --   PHOS 2.7   < > 1.8*  --   --  2.6 2.1* 1.9*  --  3.2   < > =  values in this interval not displayed.   Estimated Creatinine Clearance: 47.3 mL/min (by C-G formula based on SCr of 0.92 mg/dL). Liver & Pancreas: Recent Labs  Lab 01/20/23 2214 01/23/23 0120 01/24/23 0213 01/25/23 0130 01/26/23 0147  AST 37 58* 85* 105* 53*  ALT 47* 72* 102* 135* 117*  ALKPHOS 110 104 105 118 125  BILITOT 1.6* 0.9 0.7 0.5 0.3  PROT 7.1 6.0* 5.4* 6.0* 6.6  ALBUMIN 2.6* 2.1* 1.9* 2.1* 2.2*   No results for input(s): "LIPASE", "AMYLASE" in the last 168 hours. No results for input(s): "AMMONIA" in the last 168 hours. Diabetic: No results for input(s): "HGBA1C" in the last 72 hours. Recent Labs  Lab 01/25/23 1534 01/25/23 2000 01/26/23 0017 01/26/23 0358 01/26/23 1204  GLUCAP 100* 89 116* 120* 130*   Cardiac Enzymes: No results for input(s): "CKTOTAL", "CKMB", "CKMBINDEX", "TROPONINI" in the last 168 hours. Recent Labs    06/18/22 1331 10/05/22 0848  PROBNP 1,795* 3,434*   Coagulation Profile: No results for input(s): "INR", "PROTIME" in the last 168 hours. Thyroid Function Tests: No results for input(s): "TSH", "T4TOTAL", "FREET4", "T3FREE", "THYROIDAB" in the last 72 hours. Lipid Profile: No results for input(s): "CHOL", "HDL", "LDLCALC", "TRIG", "CHOLHDL", "LDLDIRECT" in the last 72 hours. Anemia Panel: Recent Labs    01/25/23 0628  VITAMINB12 402  FOLATE 11.3  FERRITIN 516*  TIBC 174*  IRON 44*  RETICCTPCT 2.1   Urine analysis:    Component Value Date/Time   COLORURINE YELLOW 01/13/2023 [place] 01/13/2023 1334   LABSPEC 1.020 01/13/2023 1334   PHURINE 6.0 01/13/2023 1334   GLUCOSEU >=500 (A) 01/13/2023 1334   HGBUR SMALL (A) 01/13/2023 1334   BILIRUBINUR NEGATIVE 01/13/2023 1334   KETONESUR NEGATIVE 01/13/2023 1334   PROTEINUR 30 (A) 01/13/2023 1334   NITRITE NEGATIVE 01/13/2023 1334   LEUKOCYTESUR NEGATIVE 01/13/2023 1334  Sepsis Labs: Invalid input(s): "PROCALCITONIN", "LACTICIDVEN"  Microbiology: Recent  Results (from the past 240 hour(s))  Culture, Respiratory w Gram Stain     Status: None   Collection Time: 01/16/23  3:14 PM   Specimen: Tracheal Aspirate; Respiratory  Result Value Ref Range Status   Specimen Description TRACHEAL ASPIRATE  Final   Special Requests NONE  Final   Gram Stain   Final    FEW GRAM NEGATIVE RODS RARE GRAM POSITIVE COCCI IN PAIRS FEW YEAST WITH PSEUDOHYPHAE RARE SQUAMOUS EPITHELIAL CELLS PRESENT NO WBC SEEN Performed at [facility], [place] [address], [place], [place] [address]    Culture ABUNDANT PSEUDOMONAS AERUGINOSA  Final   Report Status 01/18/2023 FINAL  Final   Organism ID, Bacteria PSEUDOMONAS AERUGINOSA  Final      Susceptibility   Pseudomonas aeruginosa - MIC*    CEFTAZIDIME 2 SENSITIVE Sensitive     CIPROFLOXACIN <=0.25 SENSITIVE Sensitive     GENTAMICIN <=1 SENSITIVE Sensitive     IMIPENEM 2 SENSITIVE Sensitive     PIP/TAZO <=4 SENSITIVE Sensitive     CEFEPIME 2 SENSITIVE Sensitive     * ABUNDANT PSEUDOMONAS AERUGINOSA  Culture, blood (Routine X 2) w Reflex to ID Panel     Status: None (Preliminary result)   Collection Time: 01/22/23  4:51 PM   Specimen: BLOOD LEFT HAND  Result Value Ref Range Status   Specimen Description BLOOD LEFT HAND  Final   Special Requests   Final    BOTTLES DRAWN AEROBIC AND ANAEROBIC Blood Culture adequate volume   Culture   Final    NO GROWTH 4 DAYS Performed at [facility], [place] [address] [place], [place] [address]    Report Status PENDING  Incomplete  Culture, blood (Routine X 2) w Reflex to ID Panel     Status: None (Preliminary result)   Collection Time: 01/22/23  4:51 PM   Specimen: BLOOD RIGHT HAND  Result Value Ref Range Status   Specimen Description BLOOD RIGHT HAND  Final   Special Requests   Final    BOTTLES DRAWN AEROBIC AND ANAEROBIC Blood Culture adequate volume   Culture   Final    NO GROWTH 4 DAYS Performed at [facility], [place] [address], [place],  [address]     Report Status PENDING  Incomplete    Radiology Studies: US Abdomen Limited RUQ (LIVER/GB)  Result Date: 01/25/2023 CLINICAL DATA:  Elevated liver enzymes. EXAM: ULTRASOUND ABDOMEN LIMITED RIGHT UPPER QUADRANT COMPARISON:  01/13/2023. FINDINGS: Gallbladder: No gallstones or wall thickening visualized. No sonographic Murphy sign noted by sonographer. Common bile duct: Diameter: 3.3 mm. Liver: No focal lesion identified. Within normal limits in parenchymal echogenicity. Portal vein is patent on color Doppler imaging with normal direction of blood flow towards the liver. Other: No free fluid.  A right pleural effusion is noted. IMPRESSION: 1. No cholelithiasis or acute cholecystitis. 2. Normal liver. 3. Right pleural effusion. Electronically Signed   By: Brett Fairy M.D.   On: 01/25/2023 20:19      Ohanna Gassert T. [place]  If 7PM-7AM, please contact night-coverage [email or web address] 01/26/2023, 1:44 PM

## 2023-01-26 NOTE — Progress Notes (Signed)
Speech Language Pathology Treatment: Dysphagia  Patient Details Name: Curtis Ramos MRN: [id number] DOB: 1932/05/12 Today's Date: 01/26/2023 Time: 1045-1100 SLP Time Calculation (min) (ACUTE ONLY): 15 min  Assessment / Plan / Recommendation Clinical Impression  Pt alert and attentive, able to follow commands for PO trials. Ongoing coughing observed with sips of water. Will proceed with MBS today.   HPI HPI: 89 year old man who presented to [facility] ED 2/29 via EMS after unwitnessed fall at work, patient did not have any complaints prior to fall. He was found down between a radiator and the wall, unresponsive with contusion and deformities to R side of his head. Initially with tachycardic with EMS, then bradycardic. ST elevation noted on EKG and Code STEMI was called, later called off as ST elevation had resolved. Patient lost pulses while in ED and became apneic with cardiac monitoring showing PEA arrest. CPR administered x 9 minutes with Epi x 4 and ROSC, patient never had a shockable rhythm. Intubated peri-arrest 2/28-3/3 then reintubated until 3/6.Marland KitchenPMHx significant for HTN, HFrEF with NICM (cMRI 12/2022 with severe LV dilation, EF 17%, LBBB, followed by Dr. Haroldine Laws), AFib (on Eliquis), MAI infection, GERD, hypothyroidism, history of thyroid CA.      SLP Plan  MBS      Recommendations for follow up therapy are one component of a multi-disciplinary discharge planning process, led by the attending physician.  Recommendations may be updated based on patient status, additional functional criteria and insurance authorization.    Recommendations  Diet recommendations: NPO                Plan: MBS           Curtis Ramos, Curtis Ramos  01/26/2023, 1:05 PM

## 2023-01-26 NOTE — Progress Notes (Signed)
Patient declined BIPAP. No unit in room at this time.

## 2023-01-26 NOTE — Progress Notes (Signed)
PT Cancellation Note  Patient Details Name: Curtis Ramos MRN: [id number] DOB: 07-Oct-1932   Cancelled Treatment:    Reason Eval/Treat Not Completed: Patient at procedure or test/unavailable (Pt off the floor. Will try again later if time permits)   Viann Shove 01/26/2023, 11:46 AM

## 2023-01-26 NOTE — Progress Notes (Signed)
Inpatient Rehabilitation Admissions Coordinator   I will place order for rehab consult for full assessment of candidacy potential.  Danne Baxter, RN, MSN Rehab Admissions Coordinator [phone] 01/26/2023 5:56 PM

## 2023-01-27 DIAGNOSIS — N179 Acute kidney failure, unspecified: Secondary | ICD-10-CM

## 2023-01-27 DIAGNOSIS — R1312 Dysphagia, oropharyngeal phase: Secondary | ICD-10-CM

## 2023-01-27 DIAGNOSIS — G931 Anoxic brain damage, not elsewhere classified: Secondary | ICD-10-CM

## 2023-01-27 DIAGNOSIS — A419 Sepsis, unspecified organism: Secondary | ICD-10-CM | POA: Diagnosis not present

## 2023-01-27 DIAGNOSIS — I469 Cardiac arrest, cause unspecified: Secondary | ICD-10-CM | POA: Diagnosis not present

## 2023-01-27 DIAGNOSIS — J95851 Ventilator associated pneumonia: Secondary | ICD-10-CM | POA: Diagnosis not present

## 2023-01-27 DIAGNOSIS — R338 Other retention of urine: Secondary | ICD-10-CM

## 2023-01-27 DIAGNOSIS — R748 Abnormal levels of other serum enzymes: Secondary | ICD-10-CM | POA: Insufficient documentation

## 2023-01-27 DIAGNOSIS — S2249XS Multiple fractures of ribs, unspecified side, sequela: Secondary | ICD-10-CM

## 2023-01-27 DIAGNOSIS — J9601 Acute respiratory failure with hypoxia: Secondary | ICD-10-CM | POA: Diagnosis not present

## 2023-01-27 DIAGNOSIS — E43 Unspecified severe protein-calorie malnutrition: Secondary | ICD-10-CM | POA: Diagnosis not present

## 2023-01-27 HISTORY — DX: Abnormal levels of other serum enzymes: R74.8

## 2023-01-27 HISTORY — DX: Dysphagia, oropharyngeal phase: R13.12

## 2023-01-27 HISTORY — DX: Other retention of urine: R33.8

## 2023-01-27 LAB — CBC
HCT: 28.7 % — ABNORMAL LOW (ref 39.0–52.0)
Hemoglobin: 9.3 g/dL — ABNORMAL LOW (ref 13.0–17.0)
MCH: 36.3 pg — ABNORMAL HIGH (ref 26.0–34.0)
MCHC: 32.4 g/dL (ref 30.0–36.0)
MCV: 112.1 fL — ABNORMAL HIGH (ref 80.0–100.0)
Platelets: 367 10*3/uL (ref 150–400)
RBC: 2.56 MIL/uL — ABNORMAL LOW (ref 4.22–5.81)
RDW: 14.2 % (ref 11.5–15.5)
WBC: 10.7 10*3/uL — ABNORMAL HIGH (ref 4.0–10.5)
nRBC: 0 % (ref 0.0–0.2)

## 2023-01-27 LAB — GLUCOSE, CAPILLARY
Glucose-Capillary: 111 mg/dL — ABNORMAL HIGH (ref 70–99)
Glucose-Capillary: 116 mg/dL — ABNORMAL HIGH (ref 70–99)
Glucose-Capillary: 117 mg/dL — ABNORMAL HIGH (ref 70–99)
Glucose-Capillary: 120 mg/dL — ABNORMAL HIGH (ref 70–99)
Glucose-Capillary: 121 mg/dL — ABNORMAL HIGH (ref 70–99)
Glucose-Capillary: 124 mg/dL — ABNORMAL HIGH (ref 70–99)

## 2023-01-27 LAB — COMPREHENSIVE METABOLIC PANEL
ALT: 109 U/L — ABNORMAL HIGH (ref 0–44)
AST: 51 U/L — ABNORMAL HIGH (ref 15–41)
Albumin: 2.2 g/dL — ABNORMAL LOW (ref 3.5–5.0)
Alkaline Phosphatase: 130 U/L — ABNORMAL HIGH (ref 38–126)
Anion gap: 10 (ref 5–15)
BUN: 34 mg/dL — ABNORMAL HIGH (ref 8–23)
CO2: 24 mmol/L (ref 22–32)
Calcium: 9.8 mg/dL (ref 8.9–10.3)
Chloride: 110 mmol/L (ref 98–111)
Creatinine, Ser: 1.03 mg/dL (ref 0.61–1.24)
GFR, Estimated: >60 mL/min (ref >60)
Glucose, Bld: 115 mg/dL — ABNORMAL HIGH (ref 70–99)
Potassium: 3.8 mmol/L (ref 3.5–5.1)
Sodium: 144 mmol/L (ref 135–145)
Total Bilirubin: 0.6 mg/dL (ref 0.3–1.2)
Total Protein: 6.1 g/dL — ABNORMAL LOW (ref 6.5–8.1)

## 2023-01-27 LAB — CULTURE, BLOOD (ROUTINE X 2)
Culture: NO GROWTH
Culture: NO GROWTH
Special Requests: ADEQUATE
Special Requests: ADEQUATE

## 2023-01-27 LAB — CK: Total CK: 60 U/L (ref 49–397)

## 2023-01-27 LAB — PHOSPHORUS: Phosphorus: 2.7 mg/dL (ref 2.5–4.6)

## 2023-01-27 LAB — MAGNESIUM: Magnesium: 2.4 mg/dL (ref 1.7–2.4)

## 2023-01-27 MED ORDER — ENOXAPARIN SODIUM 40 MG/0.4ML IJ SOSY
40.0000 mg | PREFILLED_SYRINGE | INTRAMUSCULAR | Status: DC
  Administered 2023-01-27 – 2023-02-05 (×10): 40 mg via SUBCUTANEOUS
  Filled 2023-01-27 (×10): qty 0.4

## 2023-01-27 NOTE — Progress Notes (Signed)
PROGRESS NOTE  Curtis Ramos [id number] DOB: [birth year]   PCP: Leonides Sake, MD  Patient is from: Home.  DOA: 01/13/2023 LOS: 14  Chief complaints No chief complaint on file.    Brief Narrative / Interim history: 89 year old M with PMH of systolic CHF, NICM, A-fib on Eliquis, HTN, hypothyroidism, thyroid cancer and GERD presented to ED on 2/29 after unwitnessed fall at work.  As per the PCCM notes patient was found down between the radiator and the wall unresponsive with contusion.  On arrival to ED patient became apneic and lost pulses and cardiac monitoring showed PEA arrest.  CPR administered for 9 minutes with epi x 4 and ROSC achieved.  Patient was intubated and admitted to ICU. cardiology was consulted. CT of the chest abdomen and pelvis showed known displaced transverse fractures of the mid lower sternal body, acute fracture of the coastal cartilage of bilateral second through sixth ribs, acute fracture of the anterior second to 7 ribs.  He was started on Unasyn for aspiration pneumonitis, later transitioned to cefepime for Pseudomonas VAP.    Patient was extubated on 01/19/2023, and transferred to [facility] on 01/21/2023. On 3/9, he became tachypneic and requiried BIPAP for 24 hours, and transitioned to HF oxygen.  Patient was weaned off oxygen to room air on 3/12.  Remains on TF via cortrack due to significant risk for aspiration.  Hospital course significant for delirium that seems to have resolved.  Cardiology and palliative medicine following.  Therapy recommended CIR.  CIR following.    Subjective: Seen and examined earlier this morning.  No major events overnight of this morning.  No complaints but not a great historian although he is oriented x 4 except date.  Denies pain, shortness of breath, GI or UTI symptoms.  Objective: Vitals:   01/27/23 0400 01/27/23 0900 01/27/23 1028 01/27/23 1156  BP:  122/65 132/88 122/72  Pulse:  76  76  Resp:  (!) 21  (!) 22  Temp:  97.6 F  (36.4 C)  (!) 97.5 F (36.4 C)  TempSrc:  Axillary  Axillary  SpO2:  99%  100%  Weight: 60.6 kg     Height:        Examination:  GENERAL: No apparent distress.  Nontoxic. HEENT: MMM.  On TF via cortrack NECK: Supple.  No apparent JVD.  RESP:  No IWOB.  Fair aeration bilaterally. CVS:  RRR. Heart sounds normal.  ABD/GI/GU: BS+. Abd soft, NTND.  MSK/EXT:   No apparent deformity.  Significant muscle mass and subcu fat loss. SKIN: no apparent skin lesion or wound NEURO: Awake and alert. Oriented x 4 except date.  Follows commands.  No apparent focal neuro deficit. PSYCH: Calm. Normal affect.   Procedures:  Intubation and mechanical ventilation  Microbiology summarized: 3/3-respiratory culture with Pseudomonas aeruginosa. 3/9-blood cultures NGTD  Assessment and plan: Principal Problem:   Cardiac arrest ([place]) Active Problems:   AKI (acute kidney injury) ([place])   Chronic systolic CHF (congestive heart failure) (H[facility])   Hypothyroidism   Septic shock ([facility])   Acute respiratory failure with hypoxia (H[facility])   Protein-calorie malnutrition, severe   Hematochezia   VAP (ventilator-associated pneumonia) (HCC)   Delirium   Oropharyngeal dysphagia   Elevated liver enzymes   Acute urinary retention  PEA arrest/ Cardiogenic shock: Weaned off vasopressors.  -Management of CHF as below   NICM/Chronic systolic heart failure: TTE with LVEF of 20%.  Appears euvolemic on exam.  On room air. -Lasix as needed -GDMT-Aldactone -Cardiology  signed off.  Acute respiratory failure with hypoxia: Multifactorial-PEA arrest, sternal and rib fractures and aspiration pneumonia. -Extubated on 3/6.  Currently on room air. -Pulmonary toilet and aspiration precaution  Septic shock secondary to pneumonia/COVID/ pseudomonas VAP: Resolved.  Blood cultures NGTD. -Completing course of IV cefepime today home on 3/13.   Chronic atrial fibrillation with frequent PVC's -On amiodarone 200 mg daily.  -Not on  anticoagulation due to hematochezia.   Hematochezia: Reportedly had intermittent hematochezia and anticoagulation discontinued.  Not a candidate for intervention or procedure per GI.  H&H stable now.  Anemia panel suggests anemia of chronic disease. Recent Labs    01/21/23 2034 01/22/23 0418 01/22/23 1019 01/23/23 0120 01/24/23 0213 01/25/23 0130 01/25/23 1325 01/25/23 1347 01/26/23 0147 01/27/23 0143  HGB 11.4* 11.4* 10.5* 10.4* 9.2* 9.4* 9.7* 9.5* 10.4* 9.3*  -Continue monitoring   Hypothyroidism/history of thyroid cancer:  -Continue home Synthroid -Check TSH outpatient  Hypernatremia/hypophosphatemia/hypocalcemia: Resolved.   AKI due to shock: Resolved.   Urinary retention: Seems to have resolved.  Passed voiding trial.  Elevated liver enzymes: Could be ischemic or congestive. -Recheck  Dysphagia: -N.p.o. and tube feed. -SLP following  Delirium -Delirium precaution   Severe malnutrition Body mass index is 18.63 kg/m. Nutrition Problem: Severe Malnutrition Etiology: chronic illness (CHF) Signs/Symptoms: severe fat depletion, severe muscle depletion Interventions: Refer to RD note for recommendations   DVT prophylaxis:  enoxaparin (LOVENOX) injection 40 mg Start: 01/27/23 1700 SCDs Start: 01/13/23 1327  Code Status: Full code Family Communication: None at bedside Level of care: Telemetry Medical Status is: Inpatient Remains inpatient appropriate because: Safe disposition/CIR   Final disposition: CIR Consultants:  Cardiology GI Pulmonology Palliative medicine  35 minutes with more than 50% spent in reviewing records, counseling patient/family and coordinating care.   Sch Meds:  Scheduled Meds:  amiodarone  200 mg Per Tube Daily   enoxaparin (LOVENOX) injection  40 mg Subcutaneous Q24H   feeding supplement (PROSource TF20)  60 mL Per Tube Daily   free water  200 mL Per Tube Q4H   levothyroxine  175 mcg Per Tube Daily   lidocaine  1 patch  Transdermal Q24H   melatonin  3 mg Per Tube QHS   mouth rinse  15 mL Mouth Rinse 4 times per day   pantoprazole (PROTONIX) IV  40 mg Intravenous Q12H   spironolactone  12.5 mg Per Tube Daily   Continuous Infusions:  sodium chloride Stopped (01/25/23 1632)   feeding supplement (OSMOLITE 1.5 CAL) 60 mL/hr at 01/27/23 0600   PRN Meds:.acetaminophen, docusate, HYDROmorphone (DILAUDID) injection, lip balm, ondansetron (ZOFRAN) IV, mouth rinse, polyethylene glycol, senna-docusate  Antimicrobials: Anti-infectives (From admission, onward)    Start     Dose/Rate Route Frequency Ordered Stop   01/17/23 1100  ceFEPIme (MAXIPIME) 2 g in sodium chloride 0.9 % 100 mL IVPB        2 g 200 mL/hr over 30 Minutes Intravenous Every 12 hours 01/17/23 1040 01/26/23 2359   01/16/23 1400  cefTRIAXone (ROCEPHIN) 2 g in sodium chloride 0.9 % 100 mL IVPB  Status:  Discontinued        2 g 200 mL/hr over 30 Minutes Intravenous Every 24 hours 01/16/23 1136 01/17/23 1028   01/15/23 0815  Ampicillin-Sulbactam (UNASYN) 3 g in sodium chloride 0.9 % 100 mL IVPB  Status:  Discontinued        3 g 200 mL/hr over 30 Minutes Intravenous Every 6 hours 01/15/23 0804 01/16/23 1136   01/13/23 1345  Ampicillin-Sulbactam (UNASYN)  3 g in sodium chloride 0.9 % 100 mL IVPB  Status:  Discontinued        3 g 200 mL/hr over 30 Minutes Intravenous Every 12 hours 01/13/23 1339 01/15/23 0804        I have personally reviewed the following labs and images: CBC: Recent Labs  Lab 01/23/23 0120 01/24/23 0213 01/25/23 0130 01/25/23 1325 01/25/23 1347 01/26/23 0147 01/27/23 0143  WBC 12.9* 11.5* 11.3*  --   --  11.0* 10.7*  HGB 10.4* 9.2* 9.4* 9.7* 9.5* 10.4* 9.3*  HCT 32.4* 28.5* 28.4* 29.4* 28.0* 30.7* 28.7*  MCV 112.1* 113.1* 110.9*  --   --  109.3* 112.1*  PLT 230 247 283  --   --  333 367   BMP &GFR Recent Labs  Lab 01/22/23 1651 01/23/23 0120 01/24/23 0213 01/25/23 0130 01/25/23 1347 01/26/23 0147 01/27/23 0143   NA  --  147* 145 143 144 143 144  K  --  3.7 3.8 3.5 4.0 3.9 3.8  CL  --  117* 116* 115*  --  114* 110  CO2  --  '24 23 24  '$ --  21* 24  GLUCOSE  --  116* 120* 114*  --  128* 115*  BUN  --  36* 32* 28*  --  30* 34*  CREATININE  --  0.97 0.86 0.82  --  0.92 1.03  CALCIUM  --  9.5 9.6 9.6  --  10.0 9.8  MG 2.5* 2.5* 2.4 2.3  --   --  2.4  PHOS  --  2.6 2.1* 1.9*  --  3.2 2.7   Estimated Creatinine Clearance: 42.5 mL/min (by C-G formula based on SCr of 1.03 mg/dL). Liver & Pancreas: Recent Labs  Lab 01/23/23 0120 01/24/23 0213 01/25/23 0130 01/26/23 0147 01/27/23 0143  AST 58* 85* 105* 53* 51*  ALT 72* 102* 135* 117* 109*  ALKPHOS 104 105 118 125 130*  BILITOT 0.9 0.7 0.5 0.3 0.6  PROT 6.0* 5.4* 6.0* 6.6 6.1*  ALBUMIN 2.1* 1.9* 2.1* 2.2* 2.2*   No results for input(s): "LIPASE", "AMYLASE" in the last 168 hours. No results for input(s): "AMMONIA" in the last 168 hours. Diabetic: No results for input(s): "HGBA1C" in the last 72 hours. Recent Labs  Lab 01/26/23 1938 01/26/23 2346 01/27/23 0356 01/27/23 0904 01/27/23 1202  GLUCAP 107* 116* 111* 124* 120*   Cardiac Enzymes: Recent Labs  Lab 01/27/23 0143  CKTOTAL 60   Recent Labs    06/18/22 1331 10/05/22 0848  PROBNP 1,795* 3,434*   Coagulation Profile: No results for input(s): "INR", "PROTIME" in the last 168 hours. Thyroid Function Tests: No results for input(s): "TSH", "T4TOTAL", "FREET4", "T3FREE", "THYROIDAB" in the last 72 hours. Lipid Profile: No results for input(s): "CHOL", "HDL", "LDLCALC", "TRIG", "CHOLHDL", "LDLDIRECT" in the last 72 hours. Anemia Panel: Recent Labs    01/25/23 0628  VITAMINB12 402  FOLATE 11.3  FERRITIN 516*  TIBC 174*  IRON 44*  RETICCTPCT 2.1   Urine analysis:    Component Value Date/Time   COLORURINE YELLOW 01/13/2023 [place] 01/13/2023 1334   LABSPEC 1.020 01/13/2023 1334   PHURINE 6.0 01/13/2023 1334   GLUCOSEU >=500 (A) 01/13/2023 1334   HGBUR  SMALL (A) 01/13/2023 1334   BILIRUBINUR NEGATIVE 01/13/2023 1334   KETONESUR NEGATIVE 01/13/2023 1334   PROTEINUR 30 (A) 01/13/2023 1334   NITRITE NEGATIVE 01/13/2023 1334   LEUKOCYTESUR NEGATIVE 01/13/2023 1334   Sepsis Labs: Invalid input(s): "PROCALCITONIN", "LACTICIDVEN"  Microbiology: Recent Results (from the past 240 hour(s))  Culture, blood (Routine X 2) w Reflex to ID Panel     Status: None   Collection Time: 01/22/23  4:51 PM   Specimen: BLOOD LEFT HAND  Result Value Ref Range Status   Specimen Description BLOOD LEFT HAND  Final   Special Requests   Final    BOTTLES DRAWN AEROBIC AND ANAEROBIC Blood Culture adequate volume   Culture   Final    NO GROWTH 5 DAYS Performed at [facility], [place] [address], [place], [place] [address]    Report Status 01/27/2023 FINAL  Final  Culture, blood (Routine X 2) w Reflex to ID Panel     Status: None   Collection Time: 01/22/23  4:51 PM   Specimen: BLOOD RIGHT HAND  Result Value Ref Range Status   Specimen Description BLOOD RIGHT HAND  Final   Special Requests   Final    BOTTLES DRAWN AEROBIC AND ANAEROBIC Blood Culture adequate volume   Culture   Final    NO GROWTH 5 DAYS Performed at [facility], [place] [address], [place], [place] [address]    Report Status 01/27/2023 FINAL  Final    Radiology Studies: No results found.    Laquesha Holcomb T. Clark  If 7PM-7AM, please contact night-coverage [email or web address] 01/27/2023, 4:05 PM

## 2023-01-27 NOTE — Consult Note (Signed)
Physical Medicine and Rehabilitation Consult Reason for Consult:altered mobility and mentation Referring Physician: Cyndia Skeeters   HPI: Curtis Ramos is a 89 y.o. male with a history of congestive heart failure, nonischemic cardiomyopathy, A-fib on Eliquis, hypertension and thyroid cancer.  On 01/13/2023 he had an unwitnessed fall at work.  He was found down between a radiator and a wall, unresponsive with contusion.  Upon arrival to [facility] emergency department he became apneic and pulseless and developed PEA arrest.  CPR was administered for 9 minutes and patient was resuscitated.  He was intubated admitted to the ICU.  CT of the chest and abdomen demonstrated displaced transverse fractures of the mid lower sternal body acute fracture costal cartilage of bilateral second through sixth ribs, acute fracture of anterior second to 7 ribs.  He was placed on Unasyn for aspiration pneumonitis, later transition to cefepime for Pseudomonas pneumonia.  Patient was extubated on March 6.  He has required BiPAP at times but eventually was weaned off and is now off oxygen currently as well.  Hospital course was notable for altered mental status and delirium.  He is also had hematochezia and anticoagulation was stopped.  His heart failure is managed with scheduled Aldactone and as needed Lasix.  He is on amiodarone for his chronic A-fib.  He is n.p.o. for dysphagia and is on tube feeds.  Patient worked with physical therapy yesterday and was min assist to mod assist +2 for sit to stand transfers.  He was mod assist for stand pivot transfers.  He fatigued easily.  Gait has not yet been tested.  Speech therapy is following with patient and feels he may have had some dysphagia at baseline.  He may have ice chips as needed after oral care but otherwise remains NPO.   Review of Systems  Constitutional:  Positive for malaise/fatigue.  HENT: Negative.    Eyes: Negative.   Respiratory:  Positive for cough.    Cardiovascular:  Negative for chest pain.  Gastrointestinal:  Positive for nausea.  Genitourinary:  Negative for hematuria.  Musculoskeletal:  Positive for back pain and myalgias.  Skin: Negative.   Neurological:  Positive for weakness.  Psychiatric/Behavioral:  Negative for depression.    Past Medical History:  Diagnosis Date   Acute on chronic combined systolic and diastolic CHF (congestive heart failure) ([place]) 03/19/2019   AKI (acute kidney injury) ([place]) 12/26/2014   Arthralgia 11/04/2014   Arthritis    "proabably"   Atypical mycobacterium infection    Cancer [facility])    family unaware of this hx on [id number]   Chronic systolic heart failure (HCC)    Decreased oral intake 12/26/2014   Decreased oral intake 12/26/2014   Depressed left ventricular ejection fraction 02/05/2020   Dilated cardiomyopathy ([place]) 03/19/2019   Ejection fraction 20 to 25% based on echocardiogram done by pulmonologist month ago.   DJD (degenerative joint disease) 10/07/2014   Dyspnea on exertion 03/19/2019   Essential hypertension 02/05/2020   GERD (gastroesophageal reflux disease)    Hammer toes of both feet 01/30/2018   History of thyroid cancer 12/16/2016   Hypercalcemia 12/26/2014   Hypothyroidism    Hypothyroidism associated with surgical procedure 10/07/2014   Lung mass    Myalgia 11/04/2014   Mycobacterium avium complex ([place])    Nail dystrophy 01/30/2018   Occult malignancy (HCC)    PAF (paroxysmal atrial fibrillation) ([place]) 02/05/2020   Persistent atrial fibrillation ([place]) 03/19/2019   Chads 2 Vascor equals 3  PONV (postoperative nausea and vomiting)    Pre-ulcerative calluses 01/30/2018   Pseudoaneurysm following procedure ([place]) 04/16/2020   Sensorineural hearing loss (SNHL), bilateral 08/11/2018   Shingles    Toenail fungus 01/30/2018   Wears glasses    Past Surgical History:  Procedure Laterality Date   APPENDECTOMY     CATARACT EXTRACTION W/ INTRAOCULAR LENS  IMPLANT, BILATERAL Bilateral     COLONOSCOPY     INGUINAL HERNIA REPAIR Left    LEFT HEART CATH AND CORONARY ANGIOGRAPHY N/A 03/11/2020   Procedure: LEFT HEART CATH AND CORONARY ANGIOGRAPHY;  Surgeon: Jettie Booze, MD;  Location: [facility];  Service: Cardiovascular;  Laterality: N/A;   MASS EXCISION Right 08/27/2014   Procedure: EXCISION MASS RIGHT PALM/INDEX;  Surgeon: Leanora Cover, MD;  Location: [facility];  Service: Orthopedics;  Laterality: Right;   THYROIDECTOMY  2003   History reviewed. No pertinent family history. Social History:  reports that he has never smoked. He has never used smokeless tobacco. He reports that he does not drink alcohol and does not use drugs. Allergies: No Known Allergies Medications Prior to Admission  Medication Sig Dispense Refill   amiodarone (PACERONE) 200 MG tablet Take 200 mg by mouth daily.     carvedilol (COREG) 6.25 MG tablet Take 1 tablet (6.25 mg total) by mouth 2 (two) times daily. 180 tablet 3   dapagliflozin propanediol (FARXIGA) 10 MG TABS tablet Take 1 tablet (10 mg total) by mouth daily before breakfast. 90 tablet 2   DOCUSATE SODIUM PO Take 1 tablet by mouth daily.     ELIQUIS 5 MG TABS tablet TAKE 1 TABLET BY MOUTH TWICE A DAY (Patient taking differently: Take 5 mg by mouth 2 (two) times daily.) 60 tablet 5   furosemide (LASIX) 20 MG tablet Take 4 tablets (80 mg total) by mouth daily. 180 tablet 3   Multiple Vitamins-Minerals (PRESERVISION AREDS PO) Take 1 tablet by mouth daily.     polyethylene glycol (MIRALAX / GLYCOLAX) 17 g packet Take 17 g by mouth daily.     sacubitril-valsartan (ENTRESTO) 49-51 MG Take 1 tablet by mouth 2 (two) times daily. 180 tablet 3   SYNTHROID 175 MCG tablet Take 175 mcg by mouth daily.      Home: Home Living Family/patient expects to be discharged to:: Private residence Living Arrangements: Alone Available Help at Discharge: Family, Available 24 hours/day Type of Home: House Home Access: Stairs to  enter [employer] of Steps: 2-3 Home Layout: Multi-level Alternate Level Stairs-Number of Steps: 8 up and 8 down at the front door Alternate Level Stairs-Rails: Right, Left Bathroom Shower/Tub: Multimedia programmer: Handicapped height Home Equipment: Conservation officer, nature (2 wheels), Shower seat, Grab bars - tub/shower Additional Comments: bed/bath are downstairs, kitchen is upstairs  Functional History: Prior Function Prior Level of Function : Independent/Modified Independent, Working/employed, Driving Mobility Comments: no AD ADLs Comments: indep, drives, works Functional Status:  Mobility: Bed Mobility Overal bed mobility: Needs Assistance Bed Mobility: Supine to Sit Rolling: Mod assist Sidelying to sit: Max assist Supine to sit: +2 for physical assistance, Mod assist Sit to supine: Total assist, +2 for physical assistance, +2 for safety/equipment General bed mobility comments: Needed assist to roll and to come to eOB. Transfers Overall transfer level: Needs assistance Equipment used: Rolling walker (2 wheels) Transfers: Sit to/from Stand, Bed to chair/wheelchair/BSC Sit to Stand: +2 physical assistance, Min assist, Mod assist (x2) Bed to/from chair/wheelchair/BSC transfer type:: Stand pivot Stand pivot transfers: +2 physical  assistance, Mod assist General transfer comment: Pt required cues for upright posture. Pt started off requiring Min A but required Mod A once fatigued. Due to fatigue chair was brought behind patient.      ADL: ADL Overall ADL's : Needs assistance/impaired Eating/Feeding: NPO Grooming: Maximal assistance, Bed level Upper Body Bathing: Maximal assistance, Sitting Lower Body Bathing: Maximal assistance, Sit to/from stand, +2 for physical assistance Upper Body Dressing : Maximal assistance, Sitting Lower Body Dressing: Total assistance, +2 for physical assistance, Sit to/from stand Toilet Transfer: Maximal assistance, +2 for physical  assistance, Stand-pivot, BSC/3in1 Toileting- Clothing Manipulation and Hygiene: Total assistance, +2 for physical assistance, Sit to/from stand Functional mobility during ADLs: Maximal assistance, +2 for physical assistance General ADL Comments: impaired cognition, fatigue, activity tolerance, balance, HHFNC  Cognition: Cognition Overall Cognitive Status: Within Functional Limits for tasks assessed Orientation Level: Oriented X4 Cognition Arousal/Alertness: Awake/alert Behavior During Therapy: WFL for tasks assessed/performed Overall Cognitive Status: Within Functional Limits for tasks assessed Area of Impairment: Orientation, Memory, Following commands, Safety/judgement, Awareness, Problem solving Orientation Level: Disoriented to, Time, Situation Memory: Decreased short-term memory Following Commands: Follows one step commands with increased time Safety/Judgement: Decreased awareness of safety, Decreased awareness of deficits Awareness: Intellectual Problem Solving: Slow processing, Decreased initiation, Requires verbal cues General Comments: Oriented to self and place, required frequent directional cues to initiate and sequence tasks. Moments of delirium/confusion  Blood pressure 132/88, pulse 76, temperature 97.6 F (36.4 C), temperature source Axillary, resp. rate (!) 21, height '5\' 11"'$  (1.803 m), weight 60.6 kg, SpO2 99 %. Physical Exam Constitutional:      General: He is not in acute distress.    Appearance: He is ill-appearing.  HENT:     Head: Normocephalic.     Nose:     Comments: NGT in place    Mouth/Throat:     Mouth: Mucous membranes are moist.  Eyes:     Pupils: Pupils are equal, round, and reactive to light.  Cardiovascular:     Rate and Rhythm: Normal rate.  Pulmonary:     Effort: Pulmonary effort is normal.  Abdominal:     Palpations: Abdomen is soft.  Musculoskeletal:        General: Normal range of motion.  Skin:    General: Skin is warm.     Comments:  Some chronic changes noted, +bruising/ecchymoses. Lidoderm patch on sternum  Neurological:     Comments: Pt is alert. Oriented to [facility], self, not date. Could provide some biographical information. Very tangential with limited insight and awareness. No focal CN findings. UE motor 4-/5 prox to distal. LE 3+ prox to 4- distal. Sensory exam normal for light touch and pain in all 4 limbs. No limb ataxia or cerebellar signs. No abnormal tone appreciated.    Psychiatric:        Mood and Affect: Mood normal.        Behavior: Behavior normal.     Results for orders placed or performed during the hospital encounter of 01/13/23 (from the past 24 hour(s))  Glucose, capillary     Status: Abnormal   Collection Time: 01/26/23 12:04 PM  Result Value Ref Range   Glucose-Capillary 130 (H) 70 - 99 mg/dL  Glucose, capillary     Status: Abnormal   Collection Time: 01/26/23  3:53 PM  Result Value Ref Range   Glucose-Capillary 111 (H) 70 - 99 mg/dL  Glucose, capillary     Status: Abnormal   Collection Time: 01/26/23  7:38 PM  Result Value Ref Range   Glucose-Capillary 107 (H) 70 - 99 mg/dL  Glucose, capillary     Status: Abnormal   Collection Time: 01/26/23 11:46 PM  Result Value Ref Range   Glucose-Capillary 116 (H) 70 - 99 mg/dL  Comprehensive metabolic panel     Status: Abnormal   Collection Time: 01/27/23  1:43 AM  Result Value Ref Range   Sodium 144 135 - 145 mmol/L   Potassium 3.8 3.5 - 5.1 mmol/L   Chloride 110 98 - 111 mmol/L   CO2 24 22 - 32 mmol/L   Glucose, Bld 115 (H) 70 - 99 mg/dL   BUN 34 (H) 8 - 23 mg/dL   Creatinine, Ser 1.03 0.61 - 1.24 mg/dL   Calcium 9.8 8.9 - 10.3 mg/dL   Total Protein 6.1 (L) 6.5 - 8.1 g/dL   Albumin 2.2 (L) 3.5 - 5.0 g/dL   AST 51 (H) 15 - 41 U/L   ALT 109 (H) 0 - 44 U/L   Alkaline Phosphatase 130 (H) 38 - 126 U/L   Total Bilirubin 0.6 0.3 - 1.2 mg/dL   GFR, Estimated >60 >60 mL/min   Anion gap 10 5 - 15  CK     Status: None   Collection Time:  01/27/23  1:43 AM  Result Value Ref Range   Total CK 60 49 - 397 U/L  Phosphorus     Status: None   Collection Time: 01/27/23  1:43 AM  Result Value Ref Range   Phosphorus 2.7 2.5 - 4.6 mg/dL  Magnesium     Status: None   Collection Time: 01/27/23  1:43 AM  Result Value Ref Range   Magnesium 2.4 1.7 - 2.4 mg/dL  CBC     Status: Abnormal   Collection Time: 01/27/23  1:43 AM  Result Value Ref Range   WBC 10.7 (H) 4.0 - 10.5 K/uL   RBC 2.56 (L) 4.22 - 5.81 MIL/uL   Hemoglobin 9.3 (L) 13.0 - 17.0 g/dL   HCT 28.7 (L) 39.0 - 52.0 %   MCV 112.1 (H) 80.0 - 100.0 fL   MCH 36.3 (H) 26.0 - 34.0 pg   MCHC 32.4 30.0 - 36.0 g/dL   RDW 14.2 11.5 - 15.5 %   Platelets 367 150 - 400 K/uL   nRBC 0.0 0.0 - 0.2 %  Glucose, capillary     Status: Abnormal   Collection Time: 01/27/23  3:56 AM  Result Value Ref Range   Glucose-Capillary 111 (H) 70 - 99 mg/dL  Glucose, capillary     Status: Abnormal   Collection Time: 01/27/23  9:04 AM  Result Value Ref Range   Glucose-Capillary 124 (H) 70 - 99 mg/dL   US Abdomen Limited RUQ (LIVER/GB)  Result Date: 01/25/2023 CLINICAL DATA:  Elevated liver enzymes. EXAM: ULTRASOUND ABDOMEN LIMITED RIGHT UPPER QUADRANT COMPARISON:  01/13/2023. FINDINGS: Gallbladder: No gallstones or wall thickening visualized. No sonographic Murphy sign noted by sonographer. Common bile duct: Diameter: 3.3 mm. Liver: No focal lesion identified. Within normal limits in parenchymal echogenicity. Portal vein is patent on color Doppler imaging with normal direction of blood flow towards the liver. Other: No free fluid.  A right pleural effusion is noted. IMPRESSION: 1. No cholelithiasis or acute cholecystitis. 2. Normal liver. 3. Right pleural effusion. Electronically Signed   By: Brett Fairy M.D.   On: 01/25/2023 20:19    Assessment/Plan: Diagnosis: 89 yo with PEA cardiac arrest 2/29 after fall with likely anoxic brain injury in addition to  significant debility from multiple medical  complications including sepsis/pneumonia.  Does the need for close, 24 hr/day medical supervision in concert with the patient's rehab needs make it unreasonable for this patient to be served in a less intensive setting? Yes Co-Morbidities requiring supervision/potential complications:  -dysphagia/NPO on TF -NICM/CHF -pneumonia -chronic a fib -recent hematochezia -severe malnutrition Due to bladder management, bowel management, safety, skin/wound care, disease management, medication administration, pain management, and patient education, does the patient require 24 hr/day rehab nursing? Yes Does the patient require coordinated care of a physician, rehab nurse, therapy disciplines of PT, OT, SLP to address physical and functional deficits in the context of the above medical diagnosis(es)? Yes Addressing deficits in the following areas: balance, endurance, locomotion, strength, transferring, bowel/bladder control, bathing, dressing, feeding, grooming, toileting, cognition, swallowing, and psychosocial support Can the patient actively participate in an intensive therapy program of at least 3 hrs of therapy per day at least 5 days per week? Yes The potential for patient to make measurable gains while on inpatient rehab is good Anticipated functional outcomes upon discharge from inpatient rehab are supervision and min assist  with PT, supervision and min assist with OT, supervision with SLP. Estimated rehab length of stay to reach the above functional goals is: 16-20 days Anticipated discharge destination: Home Overall Rehab/Functional Prognosis: good  POST ACUTE RECOMMENDATIONS: This patient's condition is appropriate for continued rehabilitative care in the following setting: CIR Patient has agreed to participate in recommended program. Potentially Note that insurance prior authorization may be required for reimbursement for recommended care.  Comment: Need to establish social supports. He tells  me his wife lives across the street? Unsure of who else is available to help him at home. Reports a one level home. Rehab Admissions Coordinator to follow up    MEDICAL RECOMMENDATIONS: If it is felt safe from a GI standpoint, I would recommend SQ lovenox for DVT prophylaxis, '40mg'$  daily    I have personally performed a face to face diagnostic evaluation of this patient. Additionally, I have examined the patient's medical record including any pertinent labs and radiographic images. If the physician assistant has documented in this note, I have reviewed and edited or otherwise concur with the physician assistant's documentation.  Thanks,  Meredith Staggers, MD 01/27/2023

## 2023-01-27 NOTE — Care Management Important Message (Signed)
Important Message  Patient Details  Name: THONG RASPA MRN: [id number] Date of Birth: 05/02/1932   Medicare Important Message Given:  Yes     Shelda Altes 01/27/2023, 8:47 AM

## 2023-01-27 NOTE — Progress Notes (Signed)
Palliative Medicine Progress Note   Patient Name: Curtis Ramos       Date: 01/27/2023 DOB: 1932-09-09  Age: 89 y.o. MRN#: [id number] Attending Physician: Curtis Riding, MD Primary Care Physician: Curtis Sake, MD Admit Date: 01/13/2023   HPI/Patient Profile: Palliative Care consult requested for goals of care discussion in this 89 y.o. male  with past medical history of atrial fibrillation (Eliquis), GERD, hypothyroidism, thyroid cancer, hypertension, systolic heart failure with severe LV dilation, EF 17%, LBBB as managed by Dr. Haroldine Ramos, [place].  He was admitted on 01/13/2023 after being found down at work where he fell between the radiator and the wall.  At that time he was unresponsive.  Patient suffered PEA arrest undergoing CPR for 9 minutes before ROSC while in the ED.  Extubated on 3/3 but decompensated and required reintubation. Extubated again on 3/6.      Subjective: Chart reviewed. Note that patient did not pass MBS study yesterday 3/13.  SLP has recommended he remain NPO except ice chips.  Patient tolerated PT session well yesterday 3/13 - he was able to stand with +2 minimum assist and then sit in the recliner.   Bedside visit. Patient is OOB to the recliner. He is awake and feeding himself ice chips. He denies pain, but does endorse feeling very tired and is asking to go back to bed.   Son Curtis Ramos is at bedside. Discussed that the next goal will be improved swallow function so that he start eating by mouth, as the NG feeding tube is only intended for short-term support.   Discussed that patient is being considered for CIR, with the goal to improve his mobility and overall functional status.   Objective:  Physical Exam Vitals reviewed.  Constitutional:      General: He is not in  acute distress.    Appearance: He is ill-appearing.  HENT:     Head:     Comments: Cortrak in place Pulmonary:     Effort: Pulmonary effort is normal.  Neurological:     Mental Status: He is alert. He is confused.     Motor: Weakness present.             Vital Signs: BP 122/72 (BP Location: Left Arm)   Pulse 76   Temp (!) 97.5 F (36.4 C) (Axillary)  Resp (!) 22   Ht '5\' 11"'$  (1.803 m)   Wt 60.6 kg   SpO2 100%   BMI 18.63 kg/m  SpO2: SpO2: 100 % O2 Device: O2 Device: Room Air   Palliative Medicine Assessment & Plan   Assessment: Principal Problem:   Cardiac arrest ([place]) Active Problems:   Chronic systolic CHF (congestive heart failure) (HCC)   Hypothyroidism   Septic shock (HCC)   Acute respiratory failure with hypoxia (HCC)   Protein-calorie malnutrition, severe   Hematochezia   VAP (ventilator-associated pneumonia) (HCC)   Delirium    Recommendations/Plan: Full code/full scope Allow time for outcomes Ongoing palliative support   Discharge Planning: To Be Determined   Thank you for allowing the Palliative Medicine Team to assist in the care of this patient.   MDM - [place]   Lavena Bullion, NP   Please contact Palliative Medicine Team phone at [phone] for questions and concerns.  For individual providers, please see AMION.

## 2023-01-27 NOTE — Progress Notes (Signed)
IP rehab admissions - I met with patient, his son [name] who is POA, and his DIL at the bedside.  Patient is confused.  Patient's wife is in the memory care unit at [facility] in [place].  Family are considering SNF in [place] for patient.  All family work and no one is available to stay with patient.  Patient has been living alone.  Family to discuss options and let this [facility] know decision about CIR versus SNF tomorrow.  I gave them rehab pamphlets and report card information.  I answered a lot of questions about different rehab options.  I did update the case manager on the unit.  Call me for questions.  [phone]

## 2023-01-27 NOTE — Progress Notes (Signed)
Occupational Therapy Treatment Patient Details Name: Curtis Ramos MRN: [id number] DOB: 1932/10/10 Today's Date: 01/27/2023   History of present illness Pt is 89 year old presented to [facility] on  01/13/23 after unwitnessed fall at work. In ED pt had PEA arrest with CPR x 9 minutes and intubated. Pt with respiratory failure and septic shock due to pulmonary edema, atelectasis post CPR rib/sternal fxs, PNA, and acute on chronic heart failure. Failed extubation 3/3 and reintubated until extubated 3/6. PMH - HTN, heart failure, afib, thyroid CA   OT comments  Pt progressing towards OT goals this session. Cognition remains impaired with delayed processing, but able to follow commands with vc for successful one person face to face transfer to recliner. Pt reported much improved pain tolerance during bed mobility with rolling and coming up from side. Pt max A for LB ADL at this time, but once in chair was grateful and engaged in grooming tasks with set up. He also loved washing his back EOB. OT will continue to follow acutely and AIR remains appropriate for Pt to return to PLOF.   Recommendations for follow up therapy are one component of a multi-disciplinary discharge planning process, led by the attending physician.  Recommendations may be updated based on patient status, additional functional criteria and insurance authorization.    Follow Up Recommendations  Acute inpatient rehab (3hours/day)     Assistance Recommended at Discharge Frequent or constant Supervision/Assistance  Patient can return home with the following  A lot of help with walking and/or transfers;A lot of help with bathing/dressing/bathroom;Assistance with cooking/housework;Direct supervision/assist for medications management;Assistance with feeding;Direct supervision/assist for financial management;Assist for transportation;Help with stairs or ramp for entrance   Equipment Recommendations  Other (comment) (defer to next venue of care)     Recommendations for Other Services      Precautions / Restrictions Precautions Precautions: Fall Precaution Comments: Tube feed Restrictions Weight Bearing Restrictions: No       Mobility Bed Mobility Overal bed mobility: Needs Assistance Bed Mobility: Rolling, Sidelying to Sit Rolling: Mod assist Sidelying to sit: Mod assist       General bed mobility comments: Needed assist to roll and to come to eOB.    Transfers Overall transfer level: Needs assistance Equipment used: 1 person hand held assist Transfers: Sit to/from Stand, Bed to chair/wheelchair/BSC Sit to Stand: From elevated surface, Max assist     Step pivot transfers: Max assist (step by step cues for weight shift and increased time for processing)     General transfer comment: face to face and relating weight shifting to dancing was helpful     Balance Overall balance assessment: Needs assistance Sitting-balance support: Feet supported, No upper extremity supported, Bilateral upper extremity supported Sitting balance-Leahy Scale: Poor Sitting balance - Comments: intermittently good with initial dizziness. assist to bring hips EOB   Standing balance support: Bilateral upper extremity supported, During functional activity Standing balance-Leahy Scale: Poor                             ADL either performed or assessed with clinical judgement   ADL Overall ADL's : Needs assistance/impaired Eating/Feeding: NPO   Grooming: Set up;Sitting;Wash/dry face Grooming Details (indicate cue type and reason): in recliner             Lower Body Dressing: Maximal assistance;Bed level Lower Body Dressing Details (indicate cue type and reason): to don socks Toilet Transfer: Maximal assistance;Stand-pivot;BSC/3in1;Cueing for safety;Cueing for  sequencing Toilet Transfer Details (indicate cue type and reason): 1 person face to face with gait belt         Functional mobility during ADLs: Maximal  assistance;Cueing for sequencing;Cueing for safety General ADL Comments: impaired cognition, fatigue, activity tolerance, balance    Extremity/Trunk Assessment Upper Extremity Assessment Upper Extremity Assessment: Generalized weakness   Lower Extremity Assessment Lower Extremity Assessment: Defer to PT evaluation        Vision       Perception     Praxis      Cognition Arousal/Alertness: Awake/alert Behavior During Therapy: WFL for tasks assessed/performed Overall Cognitive Status: Impaired/Different from baseline Area of Impairment: Following commands, Safety/judgement, Awareness, Problem solving, Memory                     Memory: Decreased short-term memory Following Commands: Follows one step commands with increased time Safety/Judgement: Decreased awareness of safety, Decreased awareness of deficits Awareness: Intellectual Problem Solving: Slow processing, Decreased initiation, Requires verbal cues General Comments: Oriented to self and place, required frequent directional cues to initiate and sequence tasks. Moments of delirium/confusion (attempted to drink out of a spooon) tangential story telling        Exercises      Shoulder Instructions       General Comments VSS on RA    Pertinent Vitals/ Pain       Pain Assessment Pain Assessment: 0-10 Pain Score: 2  Pain Location: generalized with movement Pain Descriptors / Indicators: Discomfort, Grimacing Pain Intervention(s): Monitored during session, Repositioned  Home Living                                          Prior Functioning/Environment              Frequency  Min 2X/week        Progress Toward Goals  OT Goals(current goals can now be found in the care plan section)  Progress towards OT goals: Progressing toward goals  Acute Rehab OT Goals Patient Stated Goal: get stronger, as independent as possible OT Goal Formulation: With patient Time For Goal  Achievement: 02/07/23 Potential to Achieve Goals: Good  Plan Discharge plan remains appropriate    Co-evaluation                 AM-PAC OT "6 Clicks" Daily Activity     Outcome Measure   Help from another person eating meals?: Total (NPO) Help from another person taking care of personal grooming?: A Lot Help from another person toileting, which includes using toliet, bedpan, or urinal?: A Lot Help from another person bathing (including washing, rinsing, drying)?: A Lot Help from another person to put on and taking off regular upper body clothing?: A Lot Help from another person to put on and taking off regular lower body clothing?: Total 6 Click Score: 10    End of Session Equipment Utilized During Treatment: Gait belt  OT Visit Diagnosis: Unsteadiness on feet (R26.81);Other abnormalities of gait and mobility (R26.89);Muscle weakness (generalized) (M62.81);History of falling (Z91.81)   Activity Tolerance Patient tolerated treatment well   Patient Left in chair;with call bell/phone within reach;with chair alarm set;with family/visitor present   Nurse Communication Mobility status        Time: [id number] OT Time Calculation (min): 33 min  Charges: OT General Charges $OT Visit: 1 Visit OT Treatments $Self Care/Home Management :  8-22 mins $Therapeutic Activity: 8-22 mins  Jesse Sans OTR/L Acute Rehabilitation Services Office: [place] 01/27/2023, 2:15 PM

## 2023-01-28 DIAGNOSIS — E43 Unspecified severe protein-calorie malnutrition: Secondary | ICD-10-CM | POA: Diagnosis not present

## 2023-01-28 DIAGNOSIS — J95851 Ventilator associated pneumonia: Secondary | ICD-10-CM | POA: Diagnosis not present

## 2023-01-28 DIAGNOSIS — A419 Sepsis, unspecified organism: Secondary | ICD-10-CM | POA: Diagnosis not present

## 2023-01-28 DIAGNOSIS — I469 Cardiac arrest, cause unspecified: Secondary | ICD-10-CM | POA: Diagnosis not present

## 2023-01-28 LAB — CBC
HCT: 29.7 % — ABNORMAL LOW (ref 39.0–52.0)
Hemoglobin: 9.6 g/dL — ABNORMAL LOW (ref 13.0–17.0)
MCH: 35.8 pg — ABNORMAL HIGH (ref 26.0–34.0)
MCHC: 32.3 g/dL (ref 30.0–36.0)
MCV: 110.8 fL — ABNORMAL HIGH (ref 80.0–100.0)
Platelets: 406 10*3/uL — ABNORMAL HIGH (ref 150–400)
RBC: 2.68 MIL/uL — ABNORMAL LOW (ref 4.22–5.81)
RDW: 14.4 % (ref 11.5–15.5)
WBC: 10.4 10*3/uL (ref 4.0–10.5)
nRBC: 0 % (ref 0.0–0.2)

## 2023-01-28 LAB — GLUCOSE, CAPILLARY
Glucose-Capillary: 101 mg/dL — ABNORMAL HIGH (ref 70–99)
Glucose-Capillary: 104 mg/dL — ABNORMAL HIGH (ref 70–99)
Glucose-Capillary: 105 mg/dL — ABNORMAL HIGH (ref 70–99)
Glucose-Capillary: 105 mg/dL — ABNORMAL HIGH (ref 70–99)
Glucose-Capillary: 107 mg/dL — ABNORMAL HIGH (ref 70–99)
Glucose-Capillary: 99 mg/dL (ref 70–99)

## 2023-01-28 LAB — COMPREHENSIVE METABOLIC PANEL
ALT: 101 U/L — ABNORMAL HIGH (ref 0–44)
AST: 47 U/L — ABNORMAL HIGH (ref 15–41)
Albumin: 2.3 g/dL — ABNORMAL LOW (ref 3.5–5.0)
Alkaline Phosphatase: 138 U/L — ABNORMAL HIGH (ref 38–126)
Anion gap: 5 (ref 5–15)
BUN: 33 mg/dL — ABNORMAL HIGH (ref 8–23)
CO2: 24 mmol/L (ref 22–32)
Calcium: 9.8 mg/dL (ref 8.9–10.3)
Chloride: 111 mmol/L (ref 98–111)
Creatinine, Ser: 1.03 mg/dL (ref 0.61–1.24)
GFR, Estimated: >60 mL/min (ref >60)
Glucose, Bld: 110 mg/dL — ABNORMAL HIGH (ref 70–99)
Potassium: 3.7 mmol/L (ref 3.5–5.1)
Sodium: 140 mmol/L (ref 135–145)
Total Bilirubin: 0.6 mg/dL (ref 0.3–1.2)
Total Protein: 6.5 g/dL (ref 6.5–8.1)

## 2023-01-28 LAB — MAGNESIUM: Magnesium: 2.5 mg/dL — ABNORMAL HIGH (ref 1.7–2.4)

## 2023-01-28 LAB — PHOSPHORUS: Phosphorus: 2.5 mg/dL (ref 2.5–4.6)

## 2023-01-28 MED ORDER — MIRTAZAPINE 7.5 MG PO TABS: 7.5000 mg | ORAL_TABLET | Freq: Every day | ORAL | Status: DC

## 2023-01-28 MED ORDER — MIRTAZAPINE 7.5 MG PO TABS
7.5000 mg | ORAL_TABLET | Freq: Every day | ORAL | Status: DC
  Administered 2023-01-28 – 2023-01-31 (×4): 7.5 mg
  Filled 2023-01-28 (×4): qty 1

## 2023-01-28 NOTE — Progress Notes (Signed)
Physical Therapy Treatment Patient Details Name: Curtis Ramos MRN: [id number] DOB: May 23, 1932 Today's Date: 01/28/2023   History of Present Illness Pt is 89 year old presented to [facility] on  01/13/23 after unwitnessed fall at work. In ED pt had PEA arrest with CPR x 9 minutes and intubated. Pt with respiratory failure and septic shock due to pulmonary edema, atelectasis post CPR rib/sternal fxs, PNA, and acute on chronic heart failure. Failed extubation 3/3 and reintubated until extubated 3/6. PMH - HTN, heart failure, afib, thyroid CA    PT Comments    Pt continues to make steady progress and was able to amb very short distance in room with assist. Feel pt could benefit from AIR and expect will need assist at home even after AIR stay.    Recommendations for follow up therapy are one component of a multi-disciplinary discharge planning process, led by the attending physician.  Recommendations may be updated based on patient status, additional functional criteria and insurance authorization.  Follow Up Recommendations  Acute inpatient rehab (3hours/day)     Assistance Recommended at Discharge Frequent or constant Supervision/Assistance  Patient can return home with the following A lot of help with walking and/or transfers;A lot of help with bathing/dressing/bathroom;Assistance with cooking/housework;Direct supervision/assist for financial management;Assist for transportation;Help with stairs or ramp for entrance   Equipment Recommendations  Wheelchair (measurements PT);Wheelchair cushion (measurements PT)    Recommendations for Other Services       Precautions / Restrictions Precautions Precautions: Fall Precaution Comments: cortrak Restrictions Weight Bearing Restrictions: No     Mobility  Bed Mobility Overal bed mobility: Needs Assistance Bed Mobility: Rolling, Sidelying to Sit Rolling: Mod assist Sidelying to sit: Mod assist       General bed mobility comments: Assist to  bring shoulders over, elevate trunk into sitting and bring hips to EOB.    Transfers Overall transfer level: Needs assistance Equipment used: Rolling walker (2 wheels) Transfers: Sit to/from Stand Sit to Stand: +2 physical assistance, Mod assist           General transfer comment: Assist to bring hips up and for balance. Pt with posterior bias.  Had pt place hands on walker to encourage anterior weight shift.    Ambulation/Gait Ambulation/Gait assistance: Mod assist, +2 safety/equipment Gait Distance (Feet): 8 Feet (8' x1, 3' x 1) Assistive device: Rolling walker (2 wheels) Gait Pattern/deviations: Step-to pattern, Decreased step length - left, Decreased step length - right, Shuffle, Trunk flexed Gait velocity: decr Gait velocity interpretation: <1.31 ft/sec, indicative of household ambulator   General Gait Details: Assist for balance and support. Verbal/tactile cues to stand more erect, incr step length, and look up.   Stairs             Wheelchair Mobility    Modified Rankin (Stroke Patients Only)       Balance Overall balance assessment: Needs assistance Sitting-balance support: Feet supported, No upper extremity supported, Bilateral upper extremity supported Sitting balance-Leahy Scale: Poor Sitting balance - Comments: UE support and intermittent cues to correct posterior lean Postural control: Posterior lean Standing balance support: Bilateral upper extremity supported, During functional activity Standing balance-Leahy Scale: Poor Standing balance comment: walker and min to mod assist for static standing                            Cognition Arousal/Alertness: Awake/alert Behavior During Therapy: WFL for tasks assessed/performed Overall Cognitive Status: Impaired/Different from baseline Area of Impairment: Following  commands, Safety/judgement, Awareness, Problem solving, Memory                     Memory: Decreased short-term  memory Following Commands: Follows one step commands with increased time Safety/Judgement: Decreased awareness of safety, Decreased awareness of deficits Awareness: Intellectual Problem Solving: Slow processing, Decreased initiation, Requires verbal cues          Exercises      General Comments General comments (skin integrity, edema, etc.): VSS on RA      Pertinent Vitals/Pain Pain Assessment Pain Assessment: Faces Faces Pain Scale: Hurts little more Pain Location: ribs with transitional movements Pain Descriptors / Indicators: Grimacing, Guarding Pain Intervention(s): Limited activity within patient's tolerance, Monitored during session, Repositioned    Home Living                          Prior Function            PT Goals (current goals can now be found in the care plan section) Progress towards PT goals: Progressing toward goals    Frequency    Min 3X/week      PT Plan Current plan remains appropriate    Co-evaluation              AM-PAC PT "6 Clicks" Mobility   Outcome Measure  Help needed turning from your back to your side while in a flat bed without using bedrails?: A Lot Help needed moving from lying on your back to sitting on the side of a flat bed without using bedrails?: A Lot Help needed moving to and from a bed to a chair (including a wheelchair)?: Total Help needed standing up from a chair using your arms (e.g., wheelchair or bedside chair)?: Total Help needed to walk in hospital room?: Total Help needed climbing 3-5 steps with a railing? : Total 6 Click Score: 8    End of Session Equipment Utilized During Treatment: Gait belt Activity Tolerance: Patient tolerated treatment well Patient left: in chair;with call bell/phone within reach;with chair alarm set Nurse Communication: Mobility status PT Visit Diagnosis: Unsteadiness on feet (R26.81);Muscle weakness (generalized) (M62.81)     Time: [id number] PT Time Calculation  (min) (ACUTE ONLY): 32 min  Charges:  $Gait Training: 8-22 mins $Therapeutic Activity: 8-22 mins                     [place] Office [place] 01/28/2023, 9:55 AM

## 2023-01-28 NOTE — Progress Notes (Addendum)
Inpatient Rehab Admissions Coordinator:    I spoke with son to follow up on conversation about CIR vs SNF. He states family does not have the ability to arrange 24/7 care after CIR, so prefer SNF. They want a SNF in [place] and [facility] is first choice. TOC notified. CIR will sign off.   Clemens Catholic, [place], [place] Admissions Coordinator  [phone] ([place]) [phone] (office)

## 2023-01-28 NOTE — Progress Notes (Addendum)
Speech Language Pathology Treatment: Dysphagia  Patient Details Name: Curtis Ramos MRN: [id number] DOB: December 10, 1931 Today's Date: 01/28/2023 Time: [id number] SLP Time Calculation (min) (ACUTE ONLY): 18 min  Assessment / Plan / Recommendation Clinical Impression  Pt seen for dysphagia intervention with introduction of RMT (respiratory muscle strength training). Ice chip trials given with cues to for effortful swallow noted delayed throat clearing intermittently.  Threshold PEP (expiratory respiratory trainer) trialed at various settings, 5 cm H20  up to 18 cm H20 which pt was unable to produce adequate pressure required. Trainer set at 10 cm H20 he was able to perform 4 sets of 15-20 repetitions throughout session. Pt denied feeling lightheaded during trials. He was given a handout with instructions and encouraged to practice 3 times a day- also educated pt's RN re: trainer placed at bedside. He will likely need prompts and reminders to use given suspected waxing and waning mental status. ST will continue to work with pt and recommend repeat MBS when appropriate.    HPI HPI: 89 year old man who [facility] ED [facility]/29 via EMS after unwitnessed fall at work, patient did not have any complaints prior to fall. He was found down between a radiator and the wall, unresponsive with contusion and deformities to R side of his head. Initially with tachycardic with EMS, then bradycardic. ST elevation noted on EKG and Code STEMI was called, later called off as ST elevation had resolved. Patient lost pulses while in ED and became apneic with cardiac monitoring showing PEA arrest. CPR administered x 9 minutes with Epi x 4 and ROSC, patient never had a shockable rhythm. Intubated peri-arrest 2/28-3/3 then reintubated until 3/6.Marland KitchenPMHx significant for HTN, HFrEF with NICM (cMRI 12/2022 with severe LV dilation, EF 17%, LBBB, followed by Dr. Haroldine Laws), AFib (on Eliquis), MAI infection, GERD, hypothyroidism, history of thyroid  CA.      SLP Plan  Continue with current plan of care      Recommendations for follow up therapy are one component of a multi-disciplinary discharge planning process, led by the attending physician.  Recommendations may be updated based on patient status, additional functional criteria and insurance authorization.    Recommendations  Diet recommendations: NPO;Other(comment) (ice chips) Liquids provided via: Teaspoon Medication Administration: Via alternative means                Oral Care Recommendations: Oral care QID Follow Up Recommendations: Skilled nursing-short term rehab (<3 hours/day) Assistance recommended at discharge: Frequent or constant Supervision/Assistance SLP Visit Diagnosis: Dysphagia, oropharyngeal phase (R13.12) Plan: Continue with current plan of care           Houston Siren  01/28/2023, 11:44 AM

## 2023-01-28 NOTE — Progress Notes (Signed)
Nutrition Follow-up  DOCUMENTATION CODES:   Severe malnutrition in context of chronic illness  INTERVENTION:  Continue tube feeds via Cortrak: -Osmolite 1.5 at 60 mL/hour (1440 mL) -PROSource TF20 60 mL daily -Provides: 2240 kcal, 110 grams of protein, 1097 mL H2O daily  With current free water flush of 200 mL every 4 hours, pt receives a total of 2297 mL H2O daily including water in tube feeds.  NUTRITION DIAGNOSIS:   Severe Malnutrition related to chronic illness (CHF) as evidenced by severe fat depletion, severe muscle depletion.  Ongoing.  GOAL:   Patient will meet greater than or equal to 90% of their needs  Met with tube feed regimen.  MONITOR:   Labs, Vent status, TF tolerance  REASON FOR ASSESSMENT:   Ventilator, Consult Enteral/tube feeding initiation and management  ASSESSMENT:   Pt with hx of CHF, HTN, GERD, and PAF presented to ED after being found down with a contusion and deformities to the right side of the head. Suffered PEA arrest in ED and CPR x 9 minutes.  2/29: intubated, bronchoscopy 3/1: Cortrak placement pending (unable to place due to agitation, TF initiated via OGT) 3/3: extubated, reintubated 3/4: Cortrak placed (terminates distal stomach) 3/6: extubated 3/7: SLP recommends NPO  3/10: rectal tube removed 3/11: rate of tube feeds increased to 60 mL/hour 3/12: transferred from ICU to [place] 3/13: s/p MBS with recommendations to remain NPO except for ice chips PRN after oral care  Met with pt at bedside. He was on room air. No family present at time of RD assessment. Pt seems somewhat confused. He was able to report he is tolerating tube feeds. Denies nausea or abdominal pain. Noted previous concern for hypernatremia that has now resolved with current free water flushes. Noted per chart family discussing CIR vs SNF for pt.  Admission wt 62.4 kg. Suspect weights of 52.1 kg on 3/11 and 49.8 kg on 3/12 inaccurate. Pt currently 62.2 kg. Will continue  to monitor trends.  Enteral Access: 10 Fr. Cortrak tube placed 01/17/23; 74 cm in left nare secured with bridle; terminates in distal stomach per abdominal x-ray 3/4  Tube Feeds: Osmolite 1.5 at 60 mL/hr + PROSource TF20 60 mL daily + free water flush 200 mL every 4 hours  Medications reviewed and include: amiodarone, Lovenox, levothyroxine, Remeron 7.5 mg QHS per tube, pantoprazole, spironolactone  Labs reviewed: CBG 99-121, BUN 33, Magnesium 2.5  UOP: 2300 mL (1.5 mL/kg/hr)  I/O: -5983.1 mL since 01/14/23  Discussed with RN. Discussed with SLP via secure chat. They are working with pt on respiratory muscle strength training to improve cough and swallow muscles. Possible plan to repeat swallow evaluation once deemed appropriate by SLP to assess for diet advancement.  Diet Order:   Diet Order             Diet NPO time specified Except for: Ice Chips  Diet effective now                  EDUCATION NEEDS:   Not appropriate for education at this time  Skin:  Skin Assessment: Reviewed RN Assessment  Last BM:  3/10  Height:   Ht Readings from Last 1 Encounters:  01/18/23 5\' 11"  (1.803 m)   Weight:   Wt Readings from Last 1 Encounters:  01/28/23 62.2 kg   Ideal Body Weight:  78.2 kg  BMI:  Body mass index is 19.13 kg/m.  Estimated Nutritional Needs:   Kcal:  1900-2100 kcal/d  Protein:  95-105g/d  Fluid:  1.8-2L/d  Loanne Drilling, MS, RD, LDN, CNSC Pager number available on Amion

## 2023-01-28 NOTE — Progress Notes (Signed)
PROGRESS NOTE  ABDURREHMAN Ramos [id number] DOB: 1932/04/10   PCP: Curtis Sake, MD  Patient is from: Home.  DOA: 01/13/2023 LOS: 15  Chief complaints No chief complaint on file.    Brief Narrative / Interim history: 89 year old M with PMH of systolic CHF, NICM, A-fib on Eliquis, HTN, hypothyroidism, thyroid cancer and GERD presented to ED on 2/29 after unwitnessed fall at work.  As per the PCCM notes patient was found down between the radiator and the wall unresponsive with contusion.  On arrival to ED patient became apneic and lost pulses and cardiac monitoring showed PEA arrest.  CPR administered for 9 minutes with epi x 4 and ROSC achieved.  Patient was intubated and admitted to ICU. cardiology was consulted. CT of the chest abdomen and pelvis showed known displaced transverse fractures of the mid lower sternal body, acute fracture of the coastal cartilage of bilateral second through sixth ribs, acute fracture of the anterior second to 7 ribs.  He was started on Unasyn for aspiration pneumonitis, later transitioned to cefepime for Pseudomonas VAP.    Patient was extubated on 01/19/2023, and transferred to [facility] on 01/21/2023. On 3/9, he became tachypneic and requiried BIPAP for 24 hours, and transitioned to HF oxygen.  Patient was weaned off oxygen to room air on 3/12.  Remains on TF via cortrack due to significant risk for aspiration.  Hospital course significant for delirium that seems to have resolved.  Cardiology and palliative medicine following.  Therapy recommended CIR.  CIR following.    Subjective: Seen and examined earlier this morning.  No major events overnight of this morning.  No complaints. He is oriented x 4 except date and year but not a great historian.  Objective: Vitals:   01/28/23 0417 01/28/23 0753 01/28/23 0818 01/28/23 1215  BP: (!) 131/96 134/69 131/81 120/67  Pulse: 72 66  74  Resp: 18 15  15   Temp: 97.8 F (36.6 C) 97.6 F (36.4 C)  (!) 97.4 F (36.3 C)   TempSrc: Oral Oral  Oral  SpO2: 99%   100%  Weight: 62.2 kg     Height:        Examination:  GENERAL: No apparent distress.  Nontoxic. HEENT: MMM.  Vision and hearing grossly intact. On TF via cortrack NECK: Supple.  No apparent JVD.  RESP:  No IWOB.  Fair aeration bilaterally. CVS:  RRR. Heart sounds normal.  ABD/GI/GU: BS+. Abd soft, NTND.  MSK/EXT:   No apparent deformity. Moves extremities. No edema.  SKIN: no apparent skin lesion or wound NEURO: Awake. Oriented fairly but no great insight.  Follows commands.  No apparent focal neuro deficit. PSYCH: Calm. Normal affect.  Procedures:  Intubation and mechanical ventilation  Microbiology summarized: 3/3-respiratory culture with Pseudomonas aeruginosa. 3/9-blood cultures NGTD  Assessment and plan: Principal Problem:   Cardiac arrest ([place]) Active Problems:   AKI (acute kidney injury) ([place])   Chronic systolic CHF (congestive heart failure) (HCC)   Hypothyroidism   Septic shock (HCC)   Acute respiratory failure with hypoxia (HCC)   Protein-calorie malnutrition, severe   Hematochezia   VAP (ventilator-associated pneumonia) (HCC)   Delirium   Oropharyngeal dysphagia   Elevated liver enzymes   Acute urinary retention  PEA arrest/ Cardiogenic shock: Weaned off vasopressors.  -Management of CHF as below   NICM/Chronic systolic heart failure: TTE with LVEF of 20%.  Appears euvolemic on exam.  On room air. -Diuretics as needed. -GDMT-Aldactone -Cardiology signed off.  Acute respiratory failure with  hypoxia: Multifactorial-PEA arrest, sternal and rib fractures and aspiration pneumonia. -Extubated on 3/6.  Currently on room air. -Pulmonary toilet and aspiration precaution  Septic shock secondary to pneumonia/COVID/ pseudomonas VAP: Resolved.  Blood cultures NGTD. -Completing course of IV cefepime today home on 3/13.   Chronic atrial fibrillation with frequent PVC's -On amiodarone 200 mg daily.  -Not on  anticoagulation due to hematochezia.   Hematochezia: Reportedly had intermittent hematochezia and anticoagulation discontinued.  Not a candidate for intervention or procedure per GI.  H&H stable now.  Anemia panel suggests anemia of chronic disease. Recent Labs    01/22/23 0418 01/22/23 1019 01/23/23 0120 01/24/23 0213 01/25/23 0130 01/25/23 1325 01/25/23 1347 01/26/23 0147 01/27/23 0143 01/28/23 0246  HGB 11.4* 10.5* 10.4* 9.2* 9.4* 9.7* 9.5* 10.4* 9.3* 9.6*  -Continue monitoring   Hypothyroidism/history of thyroid cancer:  -Continue home Synthroid -Recheck TSH outpatient  Hypernatremia/hypophosphatemia/hypocalcemia: Resolved.   AKI due to shock: Resolved.   Urinary retention: Seems to have resolved.  Passed voiding trial.  Elevated liver enzymes: Could be ischemic or congestive. -Recheck  Dysphagia: -N.p.o. and tube feed. -SLP following  Delirium: Stable.  Fairly oriented but limited insight. -Delirium precaution   Severe malnutrition Body mass index is 19.13 kg/m. Nutrition Problem: Severe Malnutrition Etiology: chronic illness (CHF) Signs/Symptoms: severe fat depletion, severe muscle depletion Interventions: Refer to RD note for recommendations   DVT prophylaxis:  enoxaparin (LOVENOX) injection 40 mg Start: 01/27/23 2200 SCDs Start: 01/13/23 1327  Code Status: Full code Family Communication: None at bedside Level of care: Telemetry Medical Status is: Inpatient Remains inpatient appropriate because: Safe disposition/CIR   Final disposition: CIR?  SNF? Consultants:  Cardiology GI Pulmonology Palliative medicine  35 minutes with more than 50% spent in reviewing records, counseling patient/family and coordinating care.   Sch Meds:  Scheduled Meds:  amiodarone  200 mg Per Tube Daily   enoxaparin (LOVENOX) injection  40 mg Subcutaneous Q24H   feeding supplement (PROSource TF20)  60 mL Per Tube Daily   free water  200 mL Per Tube Q4H    levothyroxine  175 mcg Per Tube Daily   lidocaine  1 patch Transdermal Q24H   mirtazapine  7.5 mg Per Tube QHS   mouth rinse  15 mL Mouth Rinse 4 times per day   pantoprazole (PROTONIX) IV  40 mg Intravenous Q12H   spironolactone  12.5 mg Per Tube Daily   Continuous Infusions:  sodium chloride Stopped (01/25/23 1632)   feeding supplement (OSMOLITE 1.5 CAL) 60 mL/hr at 01/27/23 0600   PRN Meds:.acetaminophen, docusate, HYDROmorphone (DILAUDID) injection, lip balm, ondansetron (ZOFRAN) IV, mouth rinse, polyethylene glycol, senna-docusate  Antimicrobials: Anti-infectives (From admission, onward)    Start     Dose/Rate Route Frequency Ordered Stop   01/17/23 1100  ceFEPIme (MAXIPIME) 2 g in sodium chloride 0.9 % 100 mL IVPB        2 g 200 mL/hr over 30 Minutes Intravenous Every 12 hours 01/17/23 1040 01/26/23 2359   01/16/23 1400  cefTRIAXone (ROCEPHIN) 2 g in sodium chloride 0.9 % 100 mL IVPB  Status:  Discontinued        2 g 200 mL/hr over 30 Minutes Intravenous Every 24 hours 01/16/23 1136 01/17/23 1028   01/15/23 0815  Ampicillin-Sulbactam (UNASYN) 3 g in sodium chloride 0.9 % 100 mL IVPB  Status:  Discontinued        3 g 200 mL/hr over 30 Minutes Intravenous Every 6 hours 01/15/23 0804 01/16/23 1136   01/13/23 1345  Ampicillin-Sulbactam (UNASYN) 3 g in sodium chloride 0.9 % 100 mL IVPB  Status:  Discontinued        3 g 200 mL/hr over 30 Minutes Intravenous Every 12 hours 01/13/23 1339 01/15/23 0804        I have personally reviewed the following labs and images: CBC: Recent Labs  Lab 01/24/23 0213 01/25/23 0130 01/25/23 1325 01/25/23 1347 01/26/23 0147 01/27/23 0143 01/28/23 0246  WBC 11.5* 11.3*  --   --  11.0* 10.7* 10.4  HGB 9.2* 9.4* 9.7* 9.5* 10.4* 9.3* 9.6*  HCT 28.5* 28.4* 29.4* 28.0* 30.7* 28.7* 29.7*  MCV 113.1* 110.9*  --   --  109.3* 112.1* 110.8*  PLT 247 283  --   --  333 367 406*   BMP &GFR Recent Labs  Lab 01/23/23 0120 01/24/23 0213  01/25/23 0130 01/25/23 1347 01/26/23 0147 01/27/23 0143 01/28/23 0246  NA 147* 145 143 144 143 144 140  K 3.7 3.8 3.5 4.0 3.9 3.8 3.7  CL 117* 116* 115*  --  114* 110 111  CO2 24 23 24   --  21* 24 24  GLUCOSE 116* 120* 114*  --  128* 115* 110*  BUN 36* 32* 28*  --  30* 34* 33*  CREATININE 0.97 0.86 0.82  --  0.92 1.03 1.03  CALCIUM 9.5 9.6 9.6  --  10.0 9.8 9.8  MG 2.5* 2.4 2.3  --   --  2.4 2.5*  PHOS 2.6 2.1* 1.9*  --  3.2 2.7 2.5   Estimated Creatinine Clearance: 43.6 mL/min (by C-G formula based on SCr of 1.03 mg/dL). Liver & Pancreas: Recent Labs  Lab 01/24/23 0213 01/25/23 0130 01/26/23 0147 01/27/23 0143 01/28/23 0246  AST 85* 105* 53* 51* 47*  ALT 102* 135* 117* 109* 101*  ALKPHOS 105 118 125 130* 138*  BILITOT 0.7 0.5 0.3 0.6 0.6  PROT 5.4* 6.0* 6.6 6.1* 6.5  ALBUMIN 1.9* 2.1* 2.2* 2.2* 2.3*   No results for input(s): "LIPASE", "AMYLASE" in the last 168 hours. No results for input(s): "AMMONIA" in the last 168 hours. Diabetic: No results for input(s): "HGBA1C" in the last 72 hours. Recent Labs  Lab 01/27/23 1942 01/27/23 2348 01/28/23 0436 01/28/23 0751 01/28/23 1223  GLUCAP 121* 116* 107* 99 105*   Cardiac Enzymes: Recent Labs  Lab 01/27/23 0143  CKTOTAL 60   Recent Labs    06/18/22 1331 10/05/22 0848  PROBNP 1,795* 3,434*   Coagulation Profile: No results for input(s): "INR", "PROTIME" in the last 168 hours. Thyroid Function Tests: No results for input(s): "TSH", "T4TOTAL", "FREET4", "T3FREE", "THYROIDAB" in the last 72 hours. Lipid Profile: No results for input(s): "CHOL", "HDL", "LDLCALC", "TRIG", "CHOLHDL", "LDLDIRECT" in the last 72 hours. Anemia Panel: No results for input(s): "VITAMINB12", "FOLATE", "FERRITIN", "TIBC", "IRON", "RETICCTPCT" in the last 72 hours.  Urine analysis:    Component Value Date/Time   COLORURINE YELLOW 01/13/2023 [place] 01/13/2023 1334   LABSPEC 1.020 01/13/2023 1334   PHURINE 6.0  01/13/2023 1334   GLUCOSEU >=500 (A) 01/13/2023 1334   HGBUR SMALL (A) 01/13/2023 1334   BILIRUBINUR NEGATIVE 01/13/2023 1334   KETONESUR NEGATIVE 01/13/2023 1334   PROTEINUR 30 (A) 01/13/2023 1334   NITRITE NEGATIVE 01/13/2023 1334   LEUKOCYTESUR NEGATIVE 01/13/2023 1334   Sepsis Labs: Invalid input(s): "PROCALCITONIN", "LACTICIDVEN"  Microbiology: Recent Results (from the past 240 hour(s))  Culture, blood (Routine X 2) w Reflex to ID Panel     Status: None  Collection Time: 01/22/23  4:51 PM   Specimen: BLOOD LEFT HAND  Result Value Ref Range Status   Specimen Description BLOOD LEFT HAND  Final   Special Requests   Final    BOTTLES DRAWN AEROBIC AND ANAEROBIC Blood Culture adequate volume   Culture   Final    NO GROWTH 5 DAYS Performed at [facility], [address], [place], [place] [address]    Report Status 01/27/2023 FINAL  Final  Culture, blood (Routine X 2) w Reflex to ID Panel     Status: None   Collection Time: 01/22/23  4:51 PM   Specimen: BLOOD RIGHT HAND  Result Value Ref Range Status   Specimen Description BLOOD RIGHT HAND  Final   Special Requests   Final    BOTTLES DRAWN AEROBIC AND ANAEROBIC Blood Culture adequate volume   Culture   Final    NO GROWTH 5 DAYS Performed at [facility], [place] [address], [place], [place] [address]    Report Status 01/27/2023 FINAL  Final    Radiology Studies: No results found.    Mignon Bechler T. Okarche  If 7PM-7AM, please contact night-coverage [email or web address] 01/28/2023, 12:44 PM

## 2023-01-29 DIAGNOSIS — E43 Unspecified severe protein-calorie malnutrition: Secondary | ICD-10-CM | POA: Diagnosis not present

## 2023-01-29 DIAGNOSIS — A419 Sepsis, unspecified organism: Secondary | ICD-10-CM | POA: Diagnosis not present

## 2023-01-29 DIAGNOSIS — I469 Cardiac arrest, cause unspecified: Secondary | ICD-10-CM | POA: Diagnosis not present

## 2023-01-29 DIAGNOSIS — J95851 Ventilator associated pneumonia: Secondary | ICD-10-CM | POA: Diagnosis not present

## 2023-01-29 LAB — GLUCOSE, CAPILLARY
Glucose-Capillary: 112 mg/dL — ABNORMAL HIGH (ref 70–99)
Glucose-Capillary: 113 mg/dL — ABNORMAL HIGH (ref 70–99)
Glucose-Capillary: 118 mg/dL — ABNORMAL HIGH (ref 70–99)
Glucose-Capillary: 98 mg/dL (ref 70–99)
Glucose-Capillary: 96 mg/dL (ref 70–99)

## 2023-01-29 NOTE — Progress Notes (Addendum)
PROGRESS NOTE  Curtis Ramos [id number] DOB: 11-06-1932   PCP: Leonides Sake, MD  Patient is from: Home.  DOA: 01/13/2023 LOS: 16  Chief complaints No chief complaint on file.    Brief Narrative / Interim history: 89 year old M with PMH of systolic CHF, NICM, A-fib on Eliquis, HTN, hypothyroidism, thyroid cancer and GERD presented to ED on 2/29 after unwitnessed fall at work.  As per the PCCM notes patient was found down between the radiator and the wall unresponsive with contusion.  On arrival to ED patient became apneic and lost pulses and cardiac monitoring showed PEA arrest.  CPR administered for 9 minutes with epi x 4 and ROSC achieved.  Patient was intubated and admitted to ICU. cardiology was consulted. CT of the chest abdomen and pelvis showed known displaced transverse fractures of the mid lower sternal body, acute fracture of the coastal cartilage of bilateral second through sixth ribs, acute fracture of the anterior second to 7 ribs.  He was started on Unasyn for aspiration pneumonitis, later transitioned to cefepime for Pseudomonas VAP.    Patient was extubated on 01/19/2023, and transferred to [facility] on 01/21/2023. On 3/9, he became tachypneic and requiried BIPAP for 24 hours, and transitioned to HF oxygen.  Patient was weaned off oxygen to room air on 3/12.  Remains on TF via cortrack due to significant risk for aspiration.  Hospital course significant for delirium that seems to have resolved.  Plan for G-tube and discharged to SNF    Subjective: Seen and examined earlier this morning.  No major events overnight or this morning.  No complaints but not a great historian.  Objective: Vitals:   01/28/23 2328 01/29/23 0412 01/29/23 0728 01/29/23 1243  BP: 126/71 124/65 125/64 114/64  Pulse: 66 70 63 (!) 59  Resp: 20 (!) 23 20 17   Temp: 97.7 F (36.5 C) (!) 97.5 F (36.4 C) (!) 97.5 F (36.4 C) 97.9 F (36.6 C)  TempSrc: Oral Oral Oral Oral  SpO2: 100% 100% 100% 100%   Weight:  63.9 kg    Height:        Examination:  GENERAL: Appears frail.  No apparent distress. HEENT: MMM.  Vision and hearing grossly intact.  Feeding tube in nose NECK: Supple.  No apparent JVD.  RESP:  No IWOB.  Fair aeration bilaterally. CVS:  RRR. Heart sounds normal.  ABD/GI/GU: BS+. Abd soft, NTND.  MSK/EXT:   No apparent deformity. Moves extremities. No edema.  SKIN: no apparent skin lesion or wound NEURO: Awake.  Oriented fairly but limited insight.  Follows commands.  No focal deficit. PSYCH: Calm. Normal affect.   Procedures:  Intubation and mechanical ventilation  Microbiology summarized: 3/3-respiratory culture with Pseudomonas aeruginosa. 3/9-blood cultures NGTD  Assessment and plan: Principal Problem:   Cardiac arrest ([place]) Active Problems:   AKI (acute kidney injury) ([place])   Chronic systolic CHF (congestive heart failure) (HCC)   Hypothyroidism   Septic shock (HCC)   Acute respiratory failure with hypoxia (HCC)   Protein-calorie malnutrition, severe   Hematochezia   VAP (ventilator-associated pneumonia) (HCC)   Delirium   Oropharyngeal dysphagia   Elevated liver enzymes   Acute urinary retention  Dysphagia: -N.p.o. and tube feed via cortrak. -Discussed about G-tube with patient's son who will discuss with his brother and call us back  PEA arrest/ Cardiogenic shock: Weaned off vasopressors.  -Management of CHF as below   NICM/Chronic systolic heart failure: TTE with LVEF of 20%.  Euvolemic.  On RA. -  Continue Aldactone.  Monitor K intermittently -Cardiology signed off.  Acute respiratory failure with hypoxia: Multifactorial-PEA arrest, sternal and rib fractures and aspiration pneumonia. -Extubated on 3/6.  Currently on room air. -Pulmonary toilet and aspiration precaution  Septic shock due to Pseudomonas VAP/COVID : Resolved.  Blood cultures NGTD. -Completing course of IV cefepime on 3/13   Chronic atrial fibrillation with frequent  PVC's -On amiodarone 200 mg daily.  -Not on anticoagulation due to hematochezia.   Hematochezia: Reportedly had intermittent hematochezia and anticoagulation discontinued.  Not a candidate for intervention or procedure per GI.  H&H stable now.  Anemia panel suggests anemia of chronic disease. Recent Labs    01/22/23 0418 01/22/23 1019 01/23/23 0120 01/24/23 0213 01/25/23 0130 01/25/23 1325 01/25/23 1347 01/26/23 0147 01/27/23 0143 01/28/23 0246  HGB 11.4* 10.5* 10.4* 9.2* 9.4* 9.7* 9.5* 10.4* 9.3* 9.6*  -Continue monitoring   Hypothyroidism/history of thyroid cancer:  -Continue home Synthroid -Recheck TSH outpatient  Hypernatremia/hypophosphatemia/hypocalcemia: Resolved.   AKI due to shock: Resolved.   Urinary retention: Seems to have resolved.  Passed voiding trial.  Elevated liver enzymes: Could be ischemic or congestive.  Delirium: Stable.  Fairly oriented but limited insight. -Delirium precaution   Severe malnutrition Body mass index is 19.65 kg/m. Nutrition Problem: Severe Malnutrition Etiology: chronic illness (CHF) Signs/Symptoms: severe fat depletion, severe muscle depletion Interventions: Refer to RD note for recommendations   DVT prophylaxis:  enoxaparin (LOVENOX) injection 40 mg Start: 01/27/23 2200 SCDs Start: 01/13/23 1327  Code Status: Full code Family Communication: Updated patient's son over the phone Level of care: Telemetry Medical Status is: Inpatient Remains inpatient appropriate because: Possible G-tube and SNF bed   Final disposition: SNF Consultants:  Cardiology GI Pulmonology Palliative medicine  35 minutes with more than 50% spent in reviewing records, counseling patient/family and coordinating care.   Sch Meds:  Scheduled Meds:  amiodarone  200 mg Per Tube Daily   enoxaparin (LOVENOX) injection  40 mg Subcutaneous Q24H   feeding supplement (PROSource TF20)  60 mL Per Tube Daily   free water  200 mL Per Tube Q4H    levothyroxine  175 mcg Per Tube Daily   lidocaine  1 patch Transdermal Q24H   mirtazapine  7.5 mg Per Tube QHS   mouth rinse  15 mL Mouth Rinse 4 times per day   pantoprazole (PROTONIX) IV  40 mg Intravenous Q12H   spironolactone  12.5 mg Per Tube Daily   Continuous Infusions:  sodium chloride Stopped (01/25/23 1632)   feeding supplement (OSMOLITE 1.5 CAL) 1,000 mL (01/29/23 0636)   PRN Meds:.acetaminophen, docusate, HYDROmorphone (DILAUDID) injection, lip balm, ondansetron (ZOFRAN) IV, mouth rinse, polyethylene glycol, senna-docusate  Antimicrobials: Anti-infectives (From admission, onward)    Start     Dose/Rate Route Frequency Ordered Stop   01/17/23 1100  ceFEPIme (MAXIPIME) 2 g in sodium chloride 0.9 % 100 mL IVPB        2 g 200 mL/hr over 30 Minutes Intravenous Every 12 hours 01/17/23 1040 01/26/23 2359   01/16/23 1400  cefTRIAXone (ROCEPHIN) 2 g in sodium chloride 0.9 % 100 mL IVPB  Status:  Discontinued        2 g 200 mL/hr over 30 Minutes Intravenous Every 24 hours 01/16/23 1136 01/17/23 1028   01/15/23 0815  Ampicillin-Sulbactam (UNASYN) 3 g in sodium chloride 0.9 % 100 mL IVPB  Status:  Discontinued        3 g 200 mL/hr over 30 Minutes Intravenous Every 6 hours 01/15/23 0804 01/16/23  1136   01/13/23 1345  Ampicillin-Sulbactam (UNASYN) 3 g in sodium chloride 0.9 % 100 mL IVPB  Status:  Discontinued        3 g 200 mL/hr over 30 Minutes Intravenous Every 12 hours 01/13/23 1339 01/15/23 0804        I have personally reviewed the following labs and images: CBC: Recent Labs  Lab 01/24/23 0213 01/25/23 0130 01/25/23 1325 01/25/23 1347 01/26/23 0147 01/27/23 0143 01/28/23 0246  WBC 11.5* 11.3*  --   --  11.0* 10.7* 10.4  HGB 9.2* 9.4* 9.7* 9.5* 10.4* 9.3* 9.6*  HCT 28.5* 28.4* 29.4* 28.0* 30.7* 28.7* 29.7*  MCV 113.1* 110.9*  --   --  109.3* 112.1* 110.8*  PLT 247 283  --   --  333 367 406*   BMP &GFR Recent Labs  Lab 01/23/23 0120 01/24/23 0213  01/25/23 0130 01/25/23 1347 01/26/23 0147 01/27/23 0143 01/28/23 0246  NA 147* 145 143 144 143 144 140  K 3.7 3.8 3.5 4.0 3.9 3.8 3.7  CL 117* 116* 115*  --  114* 110 111  CO2 24 23 24   --  21* 24 24  GLUCOSE 116* 120* 114*  --  128* 115* 110*  BUN 36* 32* 28*  --  30* 34* 33*  CREATININE 0.97 0.86 0.82  --  0.92 1.03 1.03  CALCIUM 9.5 9.6 9.6  --  10.0 9.8 9.8  MG 2.5* 2.4 2.3  --   --  2.4 2.5*  PHOS 2.6 2.1* 1.9*  --  3.2 2.7 2.5   Estimated Creatinine Clearance: 44.8 mL/min (by C-G formula based on SCr of 1.03 mg/dL). Liver & Pancreas: Recent Labs  Lab 01/24/23 0213 01/25/23 0130 01/26/23 0147 01/27/23 0143 01/28/23 0246  AST 85* 105* 53* 51* 47*  ALT 102* 135* 117* 109* 101*  ALKPHOS 105 118 125 130* 138*  BILITOT 0.7 0.5 0.3 0.6 0.6  PROT 5.4* 6.0* 6.6 6.1* 6.5  ALBUMIN 1.9* 2.1* 2.2* 2.2* 2.3*   No results for input(s): "LIPASE", "AMYLASE" in the last 168 hours. No results for input(s): "AMMONIA" in the last 168 hours. Diabetic: No results for input(s): "HGBA1C" in the last 72 hours. Recent Labs  Lab 01/28/23 1637 01/28/23 1946 01/28/23 2331 01/29/23 0431 01/29/23 0731  GLUCAP 104* 101* 105* 112* 113*   Cardiac Enzymes: Recent Labs  Lab 01/27/23 0143  CKTOTAL 60   Recent Labs    06/18/22 1331 10/05/22 0848  PROBNP 1,795* 3,434*   Coagulation Profile: No results for input(s): "INR", "PROTIME" in the last 168 hours. Thyroid Function Tests: No results for input(s): "TSH", "T4TOTAL", "FREET4", "T3FREE", "THYROIDAB" in the last 72 hours. Lipid Profile: No results for input(s): "CHOL", "HDL", "LDLCALC", "TRIG", "CHOLHDL", "LDLDIRECT" in the last 72 hours. Anemia Panel: No results for input(s): "VITAMINB12", "FOLATE", "FERRITIN", "TIBC", "IRON", "RETICCTPCT" in the last 72 hours.  Urine analysis:    Component Value Date/Time   COLORURINE YELLOW 01/13/2023 [place] 01/13/2023 1334   LABSPEC 1.020 01/13/2023 1334   PHURINE 6.0  01/13/2023 1334   GLUCOSEU >=500 (A) 01/13/2023 1334   HGBUR SMALL (A) 01/13/2023 1334   BILIRUBINUR NEGATIVE 01/13/2023 1334   KETONESUR NEGATIVE 01/13/2023 1334   PROTEINUR 30 (A) 01/13/2023 1334   NITRITE NEGATIVE 01/13/2023 1334   LEUKOCYTESUR NEGATIVE 01/13/2023 1334   Sepsis Labs: Invalid input(s): "PROCALCITONIN", "LACTICIDVEN"  Microbiology: Recent Results (from the past 240 hour(s))  Culture, blood (Routine X 2) w Reflex to ID Panel  Status: None   Collection Time: 01/22/23  4:51 PM   Specimen: BLOOD LEFT HAND  Result Value Ref Range Status   Specimen Description BLOOD LEFT HAND  Final   Special Requests   Final    BOTTLES DRAWN AEROBIC AND ANAEROBIC Blood Culture adequate volume   Culture   Final    NO GROWTH 5 DAYS Performed at [facility], [address], [place], [place] [address]    Report Status 01/27/2023 FINAL  Final  Culture, blood (Routine X 2) w Reflex to ID Panel     Status: None   Collection Time: 01/22/23  4:51 PM   Specimen: BLOOD RIGHT HAND  Result Value Ref Range Status   Specimen Description BLOOD RIGHT HAND  Final   Special Requests   Final    BOTTLES DRAWN AEROBIC AND ANAEROBIC Blood Culture adequate volume   Culture   Final    NO GROWTH 5 DAYS Performed at [facility], [place] [address], [place],  [address]    Report Status 01/27/2023 FINAL  Final    Radiology Studies: No results found.    Curtis Ramos T. [place]  If 7PM-7AM, please contact night-coverage [email or web address] 01/29/2023, 12:48 PM

## 2023-01-30 LAB — CBC
HCT: 31.7 % — ABNORMAL LOW (ref 39.0–52.0)
Hemoglobin: 10.4 g/dL — ABNORMAL LOW (ref 13.0–17.0)
MCH: 36.4 pg — ABNORMAL HIGH (ref 26.0–34.0)
MCHC: 32.8 g/dL (ref 30.0–36.0)
MCV: 110.8 fL — ABNORMAL HIGH (ref 80.0–100.0)
Platelets: 417 10*3/uL — ABNORMAL HIGH (ref 150–400)
RBC: 2.86 MIL/uL — ABNORMAL LOW (ref 4.22–5.81)
RDW: 14.6 % (ref 11.5–15.5)
WBC: 10.5 10*3/uL (ref 4.0–10.5)
nRBC: 0 % (ref 0.0–0.2)

## 2023-01-30 LAB — COMPREHENSIVE METABOLIC PANEL
ALT: 80 U/L — ABNORMAL HIGH (ref 0–44)
AST: 37 U/L (ref 15–41)
Albumin: 2.5 g/dL — ABNORMAL LOW (ref 3.5–5.0)
Alkaline Phosphatase: 153 U/L — ABNORMAL HIGH (ref 38–126)
Anion gap: 8 (ref 5–15)
BUN: 33 mg/dL — ABNORMAL HIGH (ref 8–23)
CO2: 23 mmol/L (ref 22–32)
Calcium: 10.2 mg/dL (ref 8.9–10.3)
Chloride: 111 mmol/L (ref 98–111)
Creatinine, Ser: 1.01 mg/dL (ref 0.61–1.24)
GFR, Estimated: >60 mL/min (ref >60)
Glucose, Bld: 108 mg/dL — ABNORMAL HIGH (ref 70–99)
Potassium: 4.2 mmol/L (ref 3.5–5.1)
Sodium: 142 mmol/L (ref 135–145)
Total Bilirubin: 0.4 mg/dL (ref 0.3–1.2)
Total Protein: 6.7 g/dL (ref 6.5–8.1)

## 2023-01-30 LAB — MAGNESIUM: Magnesium: 2.5 mg/dL — ABNORMAL HIGH (ref 1.7–2.4)

## 2023-01-30 LAB — PHOSPHORUS: Phosphorus: 2.9 mg/dL (ref 2.5–4.6)

## 2023-01-30 LAB — GLUCOSE, CAPILLARY
Glucose-Capillary: 104 mg/dL — ABNORMAL HIGH (ref 70–99)
Glucose-Capillary: 105 mg/dL — ABNORMAL HIGH (ref 70–99)
Glucose-Capillary: 112 mg/dL — ABNORMAL HIGH (ref 70–99)
Glucose-Capillary: 112 mg/dL — ABNORMAL HIGH (ref 70–99)
Glucose-Capillary: 114 mg/dL — ABNORMAL HIGH (ref 70–99)
Glucose-Capillary: 115 mg/dL — ABNORMAL HIGH (ref 70–99)

## 2023-01-30 NOTE — Progress Notes (Signed)
PROGRESS NOTE  Curtis Ramos [id number] DOB: Jun 26, 1932   PCP: Leonides Sake, MD  Patient is from: Home.  DOA: 01/13/2023 LOS: 2  Chief complaints No chief complaint on file.    Brief Narrative / Interim history: 89 year old M with PMH of systolic CHF, NICM, A-fib on Eliquis, HTN, hypothyroidism, thyroid cancer and GERD presented to ED on 2/29 after unwitnessed fall at work.  As per the PCCM notes patient was found down between the radiator and the wall unresponsive with contusion.  On arrival to ED patient became apneic and lost pulses and cardiac monitoring showed PEA arrest.  CPR administered for 9 minutes with epi x 4 and ROSC achieved.  Patient was intubated and admitted to ICU. cardiology was consulted. CT of the chest abdomen and pelvis showed known displaced transverse fractures of the mid lower sternal body, acute fracture of the coastal cartilage of bilateral second through sixth ribs, acute fracture of the anterior second to 7 ribs.  He was started on Unasyn for aspiration pneumonitis, later transitioned to cefepime for Pseudomonas VAP.    Patient was extubated on 01/19/2023, and transferred to [facility] on 01/21/2023. On 3/9, he became tachypneic and requiried BIPAP for 24 hours, and transitioned to HF oxygen.  Patient was weaned off oxygen to room air on 3/12.  Remains on TF via cortrack due to significant risk for aspiration.  Hospital course significant for delirium that seems to have resolved.  Family like to proceed with G-tube placement.  IR consulted.    Subjective: Seen and examined earlier this morning.  No major events overnight of this morning.  No complaints.  Objective: Vitals:   01/29/23 1934 01/30/23 0028 01/30/23 0406 01/30/23 0736  BP: 124/62 112/63 110/68 104/70  Pulse: 65 68 64 68  Resp: 17 (!) 22 (!) 21 20  Temp: 97.6 F (36.4 C) 97.9 F (36.6 C) 97.8 F (36.6 C) 98 F (36.7 C)  TempSrc: Axillary Oral Oral Oral  SpO2: 100% 100% 100% 100%  Weight:    61.9 kg   Height:        Examination:   GENERAL: Appears frail.  No apparent distress. HEENT: MMM.  Vision and hearing grossly intact.  Feeding tube in nose. NECK: Supple.  No apparent JVD.  RESP:  No IWOB.  Fair aeration bilaterally. CVS:  RRR. Heart sounds normal.  ABD/GI/GU: BS+. Abd soft, NTND.  MSK/EXT:   No apparent deformity. Moves extremities. No edema.  SKIN: no apparent skin lesion or wound NEURO: Awake and alert. Oriented fairly but limited insight.  Follows commands.  No apparent focal neuro deficit. PSYCH: Calm. Normal affect.   Procedures:  Intubation and mechanical ventilation  Microbiology summarized: 3/3-respiratory culture with Pseudomonas aeruginosa. 3/9-blood cultures NGTD  Assessment and plan: Principal Problem:   Cardiac arrest ([place]) Active Problems:   AKI (acute kidney injury) ([place])   Chronic systolic CHF (congestive heart failure) (HCC)   Hypothyroidism   Septic shock (HCC)   Acute respiratory failure with hypoxia (HCC)   Protein-calorie malnutrition, severe   Hematochezia   VAP (ventilator-associated pneumonia) ([place])   Delirium   Oropharyngeal dysphagia   Elevated liver enzymes   Acute urinary retention  Dysphagia: -N.p.o. and tube feed via cortrak. -Family likes to proceed with G-tube before transfer to SNF  PEA arrest/ Cardiogenic shock: Weaned off vasopressors.  -Management of CHF as below   NICM/Chronic systolic heart failure: TTE with LVEF of 20%.  Euvolemic.  On RA. -Continue Aldactone.  Monitor K intermittently -  Cardiology signed off.  Acute respiratory failure with hypoxia: Multifactorial-PEA arrest, sternal and rib fractures and aspiration pneumonia. -Extubated on 3/6.  Currently on room air. -Pulmonary toilet and aspiration precaution  Septic shock due to Pseudomonas VAP/COVID : Resolved.  Blood cultures NGTD. -Completing course of IV cefepime on 3/13   Chronic atrial fibrillation with frequent PVC's -On amiodarone 200 mg  daily.  -Not on anticoagulation due to hematochezia.   Hematochezia: Reportedly had intermittent hematochezia, and anticoagulation discontinued.  Not a candidate for intervention or procedure per GI.  H&H stable now.  Anemia panel suggests anemia of chronic disease. Recent Labs    01/22/23 1019 01/23/23 0120 01/24/23 0213 01/25/23 0130 01/25/23 1325 01/25/23 1347 01/26/23 0147 01/27/23 0143 01/28/23 0246 01/30/23 0212  HGB 10.5* 10.4* 9.2* 9.4* 9.7* 9.5* 10.4* 9.3* 9.6* 10.4*  -Continue monitoring   Hypothyroidism/history of thyroid cancer:  -Continue home Synthroid -Recheck TSH outpatient  Hypernatremia/hypophosphatemia/hypocalcemia: Resolved.   AKI due to shock: Resolved.   Urinary retention: Seems to have resolved.  Passed voiding trial.  Elevated liver enzymes: Could be ischemic or congestive.  Delirium: Stable.  Fairly oriented but limited insight. -Delirium precaution   Severe malnutrition Body mass index is 19.03 kg/m. Nutrition Problem: Severe Malnutrition Etiology: chronic illness (CHF) Signs/Symptoms: severe fat depletion, severe muscle depletion Interventions: Refer to RD note for recommendations   DVT prophylaxis:  enoxaparin (LOVENOX) injection 40 mg Start: 01/27/23 2200 SCDs Start: 01/13/23 1327  Code Status: Full code Family Communication: Updated patient's son and daughter-in-law over the phone Level of care: Telemetry Medical Status is: Inpatient Remains inpatient appropriate because:G-tube and SNF bed   Final disposition: SNF Consultants:  Cardiology GI Pulmonology Palliative medicine  35 minutes with more than 50% spent in reviewing records, counseling patient/family and coordinating care.   Sch Meds:  Scheduled Meds:  amiodarone  200 mg Per Tube Daily   enoxaparin (LOVENOX) injection  40 mg Subcutaneous Q24H   feeding supplement (PROSource TF20)  60 mL Per Tube Daily   free water  200 mL Per Tube Q4H   levothyroxine  175 mcg Per  Tube Daily   lidocaine  1 patch Transdermal Q24H   mirtazapine  7.5 mg Per Tube QHS   mouth rinse  15 mL Mouth Rinse 4 times per day   pantoprazole (PROTONIX) IV  40 mg Intravenous Q12H   spironolactone  12.5 mg Per Tube Daily   Continuous Infusions:  sodium chloride Stopped (01/25/23 1632)   feeding supplement (OSMOLITE 1.5 CAL) 1,000 mL (01/30/23 0040)   PRN Meds:.acetaminophen, docusate, HYDROmorphone (DILAUDID) injection, lip balm, ondansetron (ZOFRAN) IV, mouth rinse, polyethylene glycol, senna-docusate  Antimicrobials: Anti-infectives (From admission, onward)    Start     Dose/Rate Route Frequency Ordered Stop   01/17/23 1100  ceFEPIme (MAXIPIME) 2 g in sodium chloride 0.9 % 100 mL IVPB        2 g 200 mL/hr over 30 Minutes Intravenous Every 12 hours 01/17/23 1040 01/26/23 2359   01/16/23 1400  cefTRIAXone (ROCEPHIN) 2 g in sodium chloride 0.9 % 100 mL IVPB  Status:  Discontinued        2 g 200 mL/hr over 30 Minutes Intravenous Every 24 hours 01/16/23 1136 01/17/23 1028   01/15/23 0815  Ampicillin-Sulbactam (UNASYN) 3 g in sodium chloride 0.9 % 100 mL IVPB  Status:  Discontinued        3 g 200 mL/hr over 30 Minutes Intravenous Every 6 hours 01/15/23 0804 01/16/23 1136   01/13/23 1345  Ampicillin-Sulbactam (UNASYN) 3 g in sodium chloride 0.9 % 100 mL IVPB  Status:  Discontinued        3 g 200 mL/hr over 30 Minutes Intravenous Every 12 hours 01/13/23 1339 01/15/23 0804        I have personally reviewed the following labs and images: CBC: Recent Labs  Lab 01/25/23 0130 01/25/23 1325 01/25/23 1347 01/26/23 0147 01/27/23 0143 01/28/23 0246 01/30/23 0212  WBC 11.3*  --   --  11.0* 10.7* 10.4 10.5  HGB 9.4*   < > 9.5* 10.4* 9.3* 9.6* 10.4*  HCT 28.4*   < > 28.0* 30.7* 28.7* 29.7* 31.7*  MCV 110.9*  --   --  109.3* 112.1* 110.8* 110.8*  PLT 283  --   --  333 367 406* 417*   < > = values in this interval not displayed.   BMP &GFR Recent Labs  Lab 01/24/23 0213  01/25/23 0130 01/25/23 1347 01/26/23 0147 01/27/23 0143 01/28/23 0246 01/30/23 0212  NA 145 143 144 143 144 140 142  K 3.8 3.5 4.0 3.9 3.8 3.7 4.2  CL 116* 115*  --  114* 110 111 111  CO2 23 24  --  21* 24 24 23   GLUCOSE 120* 114*  --  128* 115* 110* 108*  BUN 32* 28*  --  30* 34* 33* 33*  CREATININE 0.86 0.82  --  0.92 1.03 1.03 1.01  CALCIUM 9.6 9.6  --  10.0 9.8 9.8 10.2  MG 2.4 2.3  --   --  2.4 2.5* 2.5*  PHOS 2.1* 1.9*  --  3.2 2.7 2.5 2.9   Estimated Creatinine Clearance: 44.3 mL/min (by C-G formula based on SCr of 1.01 mg/dL). Liver & Pancreas: Recent Labs  Lab 01/25/23 0130 01/26/23 0147 01/27/23 0143 01/28/23 0246 01/30/23 0212  AST 105* 53* 51* 47* 37  ALT 135* 117* 109* 101* 80*  ALKPHOS 118 125 130* 138* 153*  BILITOT 0.5 0.3 0.6 0.6 0.4  PROT 6.0* 6.6 6.1* 6.5 6.7  ALBUMIN 2.1* 2.2* 2.2* 2.3* 2.5*   No results for input(s): "LIPASE", "AMYLASE" in the last 168 hours. No results for input(s): "AMMONIA" in the last 168 hours. Diabetic: No results for input(s): "HGBA1C" in the last 72 hours. Recent Labs  Lab 01/29/23 1931 01/30/23 0025 01/30/23 0402 01/30/23 0738 01/30/23 1214  GLUCAP 96 112* 112* 105* 115*   Cardiac Enzymes: Recent Labs  Lab 01/27/23 0143  CKTOTAL 60   Recent Labs    06/18/22 1331 10/05/22 0848  PROBNP 1,795* 3,434*   Coagulation Profile: No results for input(s): "INR", "PROTIME" in the last 168 hours. Thyroid Function Tests: No results for input(s): "TSH", "T4TOTAL", "FREET4", "T3FREE", "THYROIDAB" in the last 72 hours. Lipid Profile: No results for input(s): "CHOL", "HDL", "LDLCALC", "TRIG", "CHOLHDL", "LDLDIRECT" in the last 72 hours. Anemia Panel: No results for input(s): "VITAMINB12", "FOLATE", "FERRITIN", "TIBC", "IRON", "RETICCTPCT" in the last 72 hours.  Urine analysis:    Component Value Date/Time   COLORURINE YELLOW 01/13/2023 [place] 01/13/2023 1334   LABSPEC 1.020 01/13/2023 1334    PHURINE 6.0 01/13/2023 1334   GLUCOSEU >=500 (A) 01/13/2023 1334   HGBUR SMALL (A) 01/13/2023 1334   BILIRUBINUR NEGATIVE 01/13/2023 1334   KETONESUR NEGATIVE 01/13/2023 1334   PROTEINUR 30 (A) 01/13/2023 1334   NITRITE NEGATIVE 01/13/2023 1334   LEUKOCYTESUR NEGATIVE 01/13/2023 1334   Sepsis Labs: Invalid input(s): "PROCALCITONIN", "LACTICIDVEN"  Microbiology: Recent Results (from the past 240 hour(s))  Culture,  blood (Routine X 2) w Reflex to ID Panel     Status: None   Collection Time: 01/22/23  4:51 PM   Specimen: BLOOD LEFT HAND  Result Value Ref Range Status   Specimen Description BLOOD LEFT HAND  Final   Special Requests   Final    BOTTLES DRAWN AEROBIC AND ANAEROBIC Blood Culture adequate volume   Culture   Final    NO GROWTH 5 DAYS Performed at [facility], [place] [address], [place], [place] [address]    Report Status 01/27/2023 FINAL  Final  Culture, blood (Routine X 2) w Reflex to ID Panel     Status: None   Collection Time: 01/22/23  4:51 PM   Specimen: BLOOD RIGHT HAND  Result Value Ref Range Status   Specimen Description BLOOD RIGHT HAND  Final   Special Requests   Final    BOTTLES DRAWN AEROBIC AND ANAEROBIC Blood Culture adequate volume   Culture   Final    NO GROWTH 5 DAYS Performed at [facility], [place] [address] [place], [place] [address]    Report Status 01/27/2023 FINAL  Final    Radiology Studies: No results found.    Ladon Vandenberghe T. [place]  If 7PM-7AM, please contact night-coverage [email or web address] 01/30/2023, 1:19 PM

## 2023-01-31 ENCOUNTER — Inpatient Hospital Stay (HOSPITAL_COMMUNITY): Payer: Medicare Other

## 2023-01-31 DIAGNOSIS — I5022 Chronic systolic (congestive) heart failure: Secondary | ICD-10-CM | POA: Diagnosis not present

## 2023-01-31 DIAGNOSIS — J95851 Ventilator associated pneumonia: Secondary | ICD-10-CM | POA: Diagnosis not present

## 2023-01-31 DIAGNOSIS — R338 Other retention of urine: Secondary | ICD-10-CM | POA: Diagnosis not present

## 2023-01-31 DIAGNOSIS — I469 Cardiac arrest, cause unspecified: Secondary | ICD-10-CM | POA: Diagnosis not present

## 2023-01-31 DIAGNOSIS — J9601 Acute respiratory failure with hypoxia: Secondary | ICD-10-CM | POA: Diagnosis not present

## 2023-01-31 DIAGNOSIS — E43 Unspecified severe protein-calorie malnutrition: Secondary | ICD-10-CM | POA: Diagnosis not present

## 2023-01-31 DIAGNOSIS — A419 Sepsis, unspecified organism: Secondary | ICD-10-CM | POA: Diagnosis not present

## 2023-01-31 DIAGNOSIS — R1312 Dysphagia, oropharyngeal phase: Secondary | ICD-10-CM | POA: Diagnosis not present

## 2023-01-31 DIAGNOSIS — N179 Acute kidney failure, unspecified: Secondary | ICD-10-CM | POA: Diagnosis not present

## 2023-01-31 DIAGNOSIS — R748 Abnormal levels of other serum enzymes: Secondary | ICD-10-CM

## 2023-01-31 LAB — GLUCOSE, CAPILLARY
Glucose-Capillary: 108 mg/dL — ABNORMAL HIGH (ref 70–99)
Glucose-Capillary: 113 mg/dL — ABNORMAL HIGH (ref 70–99)
Glucose-Capillary: 116 mg/dL — ABNORMAL HIGH (ref 70–99)
Glucose-Capillary: 117 mg/dL — ABNORMAL HIGH (ref 70–99)
Glucose-Capillary: 121 mg/dL — ABNORMAL HIGH (ref 70–99)

## 2023-01-31 NOTE — Progress Notes (Signed)
Physical Therapy Treatment Patient Details Name: Curtis Ramos MRN: [id number] DOB: May 24, 1932 Today's Date: 01/31/2023   History of Present Illness Pt is 89 year old presented to [facility] on  01/13/23 after unwitnessed fall at work. In ED pt had PEA arrest with CPR x 9 minutes and intubated. Pt with respiratory failure and septic shock due to pulmonary edema, atelectasis post CPR rib/sternal fxs, PNA, and acute on chronic heart failure. Failed extubation 3/3 and reintubated until extubated 3/6. PMH - HTN, heart failure, afib, thyroid CA    PT Comments    Pt tolerated treatment well today. Pt with similar presentation to previous session however able to progress ambulation with +2 Mod A and chair follow. DC recs changed from CIR to SNF due to family electing for SNF. PT will continue to follow.   Recommendations for follow up therapy are one component of a multi-disciplinary discharge planning process, led by the attending physician.  Recommendations may be updated based on patient status, additional functional criteria and insurance authorization.  Follow Up Recommendations  Skilled nursing-short term rehab (<3 hours/day) (Family elected for SNF instead of CIR) Can patient physically be transported by private vehicle: No   Assistance Recommended at Discharge Frequent or constant Supervision/Assistance  Patient can return home with the following A lot of help with walking and/or transfers;A lot of help with bathing/dressing/bathroom;Assistance with cooking/housework;Direct supervision/assist for financial management;Assist for transportation;Help with stairs or ramp for entrance   Equipment Recommendations  Wheelchair (measurements PT);Wheelchair cushion (measurements PT)    Recommendations for Other Services       Precautions / Restrictions Precautions Precautions: Fall Restrictions Weight Bearing Restrictions: No     Mobility  Bed Mobility Overal bed mobility: Needs Assistance Bed  Mobility: Rolling, Supine to Sit Rolling: Mod assist   Supine to sit: +2 for physical assistance, Mod assist     General bed mobility comments: Assist to bring shoulders over, elevate trunk into sitting and bring hips to EOB.    Transfers Overall transfer level: Needs assistance Equipment used: Rolling walker (2 wheels) Transfers: Sit to/from Stand Sit to Stand: +2 physical assistance, Min assist           General transfer comment: Cues for hand placement on RW. Cues for upright posture when standing. Pt requires increased time for processing 1 step commands.    Ambulation/Gait Ambulation/Gait assistance: Mod assist, +2 safety/equipment Gait Distance (Feet): 12 Feet Assistive device: Rolling walker (2 wheels) Gait Pattern/deviations: Step-to pattern, Decreased step length - left, Decreased step length - right, Shuffle, Trunk flexed Gait velocity: decr     General Gait Details: Assist for balance and support. Verbal/tactile cues to stand more erect, incr step length, and look up.   Stairs             Wheelchair Mobility    Modified Rankin (Stroke Patients Only)       Balance Overall balance assessment: Needs assistance Sitting-balance support: Feet supported, No upper extremity supported, Bilateral upper extremity supported Sitting balance-Leahy Scale: Fair     Standing balance support: Bilateral upper extremity supported, During functional activity Standing balance-Leahy Scale: Poor Standing balance comment: walker and min to mod assist for static standing                            Cognition Arousal/Alertness: Awake/alert Behavior During Therapy: WFL for tasks assessed/performed Overall Cognitive Status: Impaired/Different from baseline Area of Impairment: Following commands, Safety/judgement, Awareness, Problem solving,  Memory                       Following Commands: Follows one step commands with increased  time Safety/Judgement: Decreased awareness of safety, Decreased awareness of deficits Awareness: Intellectual Problem Solving: Slow processing, Decreased initiation, Requires verbal cues General Comments: Pt demonstrating improved cognition compared to previous session however deficits still present.        Exercises      General Comments General comments (skin integrity, edema, etc.): VSS on RA      Pertinent Vitals/Pain Pain Assessment Pain Assessment: No/denies pain    Home Living                          Prior Function            PT Goals (current goals can now be found in the care plan section) Progress towards PT goals: Progressing toward goals    Frequency    Min 3X/week      PT Plan Discharge plan needs to be updated    Co-evaluation PT/OT/SLP Co-Evaluation/Treatment: Yes Reason for Co-Treatment: Complexity of the patient's impairments (multi-system involvement) PT goals addressed during session: Mobility/safety with mobility;Proper use of DME        AM-PAC PT "6 Clicks" Mobility   Outcome Measure  Help needed turning from your back to your side while in a flat bed without using bedrails?: A Lot Help needed moving from lying on your back to sitting on the side of a flat bed without using bedrails?: A Lot Help needed moving to and from a bed to a chair (including a wheelchair)?: A Lot Help needed standing up from a chair using your arms (e.g., wheelchair or bedside chair)?: A Lot Help needed to walk in hospital room?: A Lot Help needed climbing 3-5 steps with a railing? : Total 6 Click Score: 11    End of Session Equipment Utilized During Treatment: Gait belt Activity Tolerance: Patient tolerated treatment well Patient left: in chair;with call bell/phone within reach;with chair alarm set;Other (comment) (Handoff to SLP) Nurse Communication: Mobility status PT Visit Diagnosis: Unsteadiness on feet (R26.81);Muscle weakness (generalized)  (M62.81)     Time: [id number] PT Time Calculation (min) (ACUTE ONLY): 28 min  Charges:  $Gait Training: 8-22 mins                     Curtis Ramos, PT, DPT Acute Rehab Services [id number]    Viann Shove 01/31/2023, 10:50 AM

## 2023-01-31 NOTE — Progress Notes (Signed)
Modified Barium Swallow Study  Patient Details  Name: Curtis Ramos MRN: [id number] Date of Birth: June 21, 1932  Today's Date: 01/31/2023  Modified Barium Swallow completed.  Full report located under Chart Review in the Imaging Section.  History of Present Illness 89 year old man who presented to [facility] ED 2/29 via EMS after unwitnessed fall at work, patient did not have any complaints prior to fall. He was found down between a radiator and the wall, unresponsive with contusion and deformities to R side of his head. Initially with tachycardic with EMS, then bradycardic. ST elevation noted on EKG and Code STEMI was called, later called off as ST elevation had resolved. Patient lost pulses while in ED and became apneic with cardiac monitoring showing PEA arrest. CPR administered x 9 minutes with Epi x 4 and ROSC, patient never had a shockable rhythm. Intubated peri-arrest 2/28-3/3 then reintubated until 3/6.Marland KitchenPMHx significant for HTN, HFrEF with NICM (cMRI 12/2022 with severe LV dilation, EF 17%, LBBB, followed by Dr. Haroldine Laws), AFib (on Eliquis), MAI infection, GERD, hypothyroidism, history of thyroid CA.   Clinical Impression Pt is starting to show progress since initial MBS, in terms of his pharyngeal physiology as well as his overall mentation and respiratory status. Although he still has reduced base of tongue retraction, anterior hyoid movement, and pharyngeal peristalsis, he is starting to show improvement in these areas as well as his epiglottic inversion. This results in less pharyngeal residue and less invasion into the laryngeal vestibule. Silent aspiration occurred after the swallow with thin liquids and he had difficulty clearing his airway once aspiration had occurred. With other consistencies tested he had more of a tendency to penetrate in very small quantities, often reaching the vocal folds without attempts to spontaneously clear. When cued to cough he is able to expel penetrates further  airway from his vocal folds and sometimes even out of his laryngeal vestibule, but he often needs to be prompted to cough harder. A cough followed by a second swallowed helped to reduce some pharyngeal residue and reduced instances in which barium fell back into the laryngeal vestibule. Given reports of baseline dysphagia, suspect that there will always inherently be some risk of aspiration. Discussed options with pt's son, [name], and his wife, and they prefer to allow pt to start a modified diet with careful assistance for use of strategies to try to mitigate risk as much as possible. They would prefer to avoid a longer-term feeding tube if possible, acknowledging risks of that but also the comfort and joy pt gets from PO intake. MD is also in agreement with starting PO diet with precautions. SLP will continue to follow.  Factors that may increase risk of adverse event in presence of aspiration Phineas Douglas & Padilla 2021): Poor general health and/or compromised immunity;Frail or deconditioned;Weak cough;Presence of tubes (ETT, trach, NG, etc.)  Swallow Evaluation Recommendations Recommendations: PO diet PO Diet Recommendation: Dysphagia 1 (Pureed);Mildly thick liquids (Level 2, nectar thick) Liquid Administration via: Cup;Straw Medication Administration: Crushed with puree Supervision: Staff to assist with self-feeding;Full supervision/cueing for swallowing strategies Swallowing strategies  : Minimize environmental distractions;Slow rate;Small bites/sips;Hard cough after swallowing;Multiple dry swallows after each bite/sip (cough, then swallow) Postural changes: Position pt fully upright for meals Oral care recommendations: Oral care BID (2x/day)      Osie Bond., M.A. [place] Office [phone]  Secure chat preferred  01/31/2023,5:31 PM

## 2023-01-31 NOTE — Progress Notes (Signed)
PROGRESS NOTE  Curtis Ramos [id number] DOB: 1932/02/03   PCP: Leonides Sake, MD  Patient is from: Home.  DOA: 01/13/2023 LOS: 18  Chief complaints No chief complaint on file.    Brief Narrative / Interim history: 89 year old M with PMH of systolic CHF, NICM, A-fib on Eliquis, HTN, hypothyroidism, thyroid cancer and GERD presented to ED on 2/29 after unwitnessed fall at work.  As per the PCCM notes patient was found down between the radiator and the wall unresponsive with contusion.  On arrival to ED patient became apneic and lost pulses and cardiac monitoring showed PEA arrest.  CPR administered for 9 minutes with epi x 4 and ROSC achieved.  Patient was intubated and admitted to ICU. cardiology was consulted. CT of the chest abdomen and pelvis showed known displaced transverse fractures of the mid lower sternal body, acute fracture of the coastal cartilage of bilateral second through sixth ribs, acute fracture of the anterior second to 7 ribs.  He was started on Unasyn for aspiration pneumonitis, later transitioned to cefepime for Pseudomonas VAP.    Patient was extubated on 01/19/2023, and transferred to [facility] on 01/21/2023. On 3/9, he became tachypneic and requiried BIPAP for 24 hours, and transitioned to HF oxygen.  Patient was weaned off oxygen to room air on 3/12.  Remains on TF via cortrack due to significant risk for aspiration.  Hospital course significant for delirium that seems to have resolved.  After discussion with family, IR was consulted for G-tube.  However, SLP reevaluated patient and advanced diet to dysphagia-1.  Now family wants to hold on G-tube.    Subjective: Seen and examined earlier this morning.  No major events overnight of this morning.  No complaints.  Objective: Vitals:   01/31/23 0022 01/31/23 0334 01/31/23 0813 01/31/23 1206  BP: 122/61 119/73 106/67 109/69  Pulse: 67 68 67 74  Resp: 18 17 20  (!) 22  Temp: 98 F (36.7 C) 98.1 F (36.7 C) 98.1 F  (36.7 C) 97.7 F (36.5 C)  TempSrc: Oral Oral Oral Oral  SpO2: 100% 100% 100% 100%  Weight:  61.4 kg    Height:        Examination:  GENERAL: Appears frail.  No apparent distress. HEENT: MMM.  Vision and hearing grossly intact.  Feeding tube in nose. NECK: Supple.  No apparent JVD.  RESP:  No IWOB.  Fair aeration bilaterally. CVS:  RRR. Heart sounds normal.  ABD/GI/GU: BS+. Abd soft, NTND.  MSK/EXT:   No apparent deformity. Moves extremities. No edema.  SKIN: no apparent skin lesion or wound NEURO: Awake and alert. Oriented appropriately.  No apparent focal neuro deficit. PSYCH: Calm. Normal affect.   Procedures:  Intubation and mechanical ventilation  Microbiology summarized: 3/3-respiratory culture with Pseudomonas aeruginosa. 3/9-blood cultures NGTD  Assessment and plan: Principal Problem:   Cardiac arrest ([place]) Active Problems:   AKI (acute kidney injury) ([place])   Chronic systolic CHF (congestive heart failure) (HCC)   Hypothyroidism   Septic shock (HCC)   Acute respiratory failure with hypoxia (HCC)   Protein-calorie malnutrition, severe   Hematochezia   VAP (ventilator-associated pneumonia) (HCC)   Delirium   Oropharyngeal dysphagia   Elevated liver enzymes   Acute urinary retention  Dysphagia: -Continue tube feed via cortrak. -SLP started dysphagia 1 diet.  Family likes to hold off G-tube now  PEA arrest/ Cardiogenic shock: Weaned off vasopressors.  -Management of CHF as below   NICM/Chronic systolic heart failure: TTE with LVEF of 20%.  Euvolemic.  On RA. -Continue Aldactone.  Monitor K intermittently -Cardiology signed off.  Acute respiratory failure with hypoxia: Multifactorial-PEA arrest, sternal and rib fractures and aspiration pneumonia. -Extubated on 3/6.  Currently on room air. -Pulmonary toilet and aspiration precaution  Septic shock due to Pseudomonas VAP/COVID : Resolved.  Blood cultures NGTD. -Completing course of IV cefepime on 3/13    Chronic atrial fibrillation with frequent PVC's -On amiodarone 200 mg daily.  -Not on anticoagulation due to hematochezia.   Hematochezia: Reportedly had intermittent hematochezia, and anticoagulation discontinued.  Not a candidate for intervention or procedure per GI.  H&H stable now.  Anemia panel suggests anemia of chronic disease. Recent Labs    01/22/23 1019 01/23/23 0120 01/24/23 0213 01/25/23 0130 01/25/23 1325 01/25/23 1347 01/26/23 0147 01/27/23 0143 01/28/23 0246 01/30/23 0212  HGB 10.5* 10.4* 9.2* 9.4* 9.7* 9.5* 10.4* 9.3* 9.6* 10.4*  -Monitor intermittently.   Hypothyroidism/history of thyroid cancer:  -Continue home Synthroid -Recheck TSH outpatient  Hypernatremia/hypophosphatemia/hypocalcemia: Resolved.   AKI due to shock: Resolved.   Urinary retention: Seems to have resolved.  Passed voiding trial.  Elevated liver enzymes: Could be ischemic or congestive.  Delirium: Stable.  Fairly oriented but limited insight. -Delirium precaution   Severe malnutrition Body mass index is 18.88 kg/m. Nutrition Problem: Severe Malnutrition Etiology: chronic illness (CHF) Signs/Symptoms: severe fat depletion, severe muscle depletion Interventions: Refer to RD note for recommendations   DVT prophylaxis:  enoxaparin (LOVENOX) injection 40 mg Start: 01/27/23 2200 SCDs Start: 01/13/23 1327  Code Status: Full code Family Communication: None at bedside today. Level of care: Telemetry Medical Status is: Inpatient Remains inpatient appropriate because: Dysphagia and decreased oral intake   Final disposition: SNF Consultants:  Cardiology GI Pulmonology Palliative medicine  35 minutes with more than 50% spent in reviewing records, counseling patient/family and coordinating care.   Sch Meds:  Scheduled Meds:  amiodarone  200 mg Per Tube Daily   enoxaparin (LOVENOX) injection  40 mg Subcutaneous Q24H   feeding supplement (PROSource TF20)  60 mL Per Tube Daily    free water  200 mL Per Tube Q4H   levothyroxine  175 mcg Per Tube Daily   lidocaine  1 patch Transdermal Q24H   mirtazapine  7.5 mg Per Tube QHS   mouth rinse  15 mL Mouth Rinse 4 times per day   pantoprazole (PROTONIX) IV  40 mg Intravenous Q12H   spironolactone  12.5 mg Per Tube Daily   Continuous Infusions:  sodium chloride Stopped (01/25/23 1632)   feeding supplement (OSMOLITE 1.5 CAL) 60 mL/hr at 01/31/23 1137   PRN Meds:.docusate, HYDROmorphone (DILAUDID) injection, lip balm, ondansetron (ZOFRAN) IV, mouth rinse, polyethylene glycol, senna-docusate  Antimicrobials: Anti-infectives (From admission, onward)    Start     Dose/Rate Route Frequency Ordered Stop   01/17/23 1100  ceFEPIme (MAXIPIME) 2 g in sodium chloride 0.9 % 100 mL IVPB        2 g 200 mL/hr over 30 Minutes Intravenous Every 12 hours 01/17/23 1040 01/26/23 2359   01/16/23 1400  cefTRIAXone (ROCEPHIN) 2 g in sodium chloride 0.9 % 100 mL IVPB  Status:  Discontinued        2 g 200 mL/hr over 30 Minutes Intravenous Every 24 hours 01/16/23 1136 01/17/23 1028   01/15/23 0815  Ampicillin-Sulbactam (UNASYN) 3 g in sodium chloride 0.9 % 100 mL IVPB  Status:  Discontinued        3 g 200 mL/hr over 30 Minutes Intravenous Every 6 hours 01/15/23  [id number] 01/16/23 1136   01/13/23 1345  Ampicillin-Sulbactam (UNASYN) 3 g in sodium chloride 0.9 % 100 mL IVPB  Status:  Discontinued        3 g 200 mL/hr over 30 Minutes Intravenous Every 12 hours 01/13/23 1339 01/15/23 0804        I have personally reviewed the following labs and images: CBC: Recent Labs  Lab 01/25/23 0130 01/25/23 1325 01/25/23 1347 01/26/23 0147 01/27/23 0143 01/28/23 0246 01/30/23 0212  WBC 11.3*  --   --  11.0* 10.7* 10.4 10.5  HGB 9.4*   < > 9.5* 10.4* 9.3* 9.6* 10.4*  HCT 28.4*   < > 28.0* 30.7* 28.7* 29.7* 31.7*  MCV 110.9*  --   --  109.3* 112.1* 110.8* 110.8*  PLT 283  --   --  333 367 406* 417*   < > = values in this interval not displayed.    BMP &GFR Recent Labs  Lab 01/25/23 0130 01/25/23 1347 01/26/23 0147 01/27/23 0143 01/28/23 0246 01/30/23 0212  NA 143 144 143 144 140 142  K 3.5 4.0 3.9 3.8 3.7 4.2  CL 115*  --  114* 110 111 111  CO2 24  --  21* 24 24 23   GLUCOSE 114*  --  128* 115* 110* 108*  BUN 28*  --  30* 34* 33* 33*  CREATININE 0.82  --  0.92 1.03 1.03 1.01  CALCIUM 9.6  --  10.0 9.8 9.8 10.2  MG 2.3  --   --  2.4 2.5* 2.5*  PHOS 1.9*  --  3.2 2.7 2.5 2.9   Estimated Creatinine Clearance: 43.9 mL/min (by C-G formula based on SCr of 1.01 mg/dL). Liver & Pancreas: Recent Labs  Lab 01/25/23 0130 01/26/23 0147 01/27/23 0143 01/28/23 0246 01/30/23 0212  AST 105* 53* 51* 47* 37  ALT 135* 117* 109* 101* 80*  ALKPHOS 118 125 130* 138* 153*  BILITOT 0.5 0.3 0.6 0.6 0.4  PROT 6.0* 6.6 6.1* 6.5 6.7  ALBUMIN 2.1* 2.2* 2.2* 2.3* 2.5*   No results for input(s): "LIPASE", "AMYLASE" in the last 168 hours. No results for input(s): "AMMONIA" in the last 168 hours. Diabetic: No results for input(s): "HGBA1C" in the last 72 hours. Recent Labs  Lab 01/30/23 1715 01/30/23 1942 01/31/23 0021 01/31/23 0331 01/31/23 0811  GLUCAP 114* 104* 108* 113* 116*   Cardiac Enzymes: Recent Labs  Lab 01/27/23 0143  CKTOTAL 60   Recent Labs    06/18/22 1331 10/05/22 0848  PROBNP 1,795* 3,434*   Coagulation Profile: No results for input(s): "INR", "PROTIME" in the last 168 hours. Thyroid Function Tests: No results for input(s): "TSH", "T4TOTAL", "FREET4", "T3FREE", "THYROIDAB" in the last 72 hours. Lipid Profile: No results for input(s): "CHOL", "HDL", "LDLCALC", "TRIG", "CHOLHDL", "LDLDIRECT" in the last 72 hours. Anemia Panel: No results for input(s): "VITAMINB12", "FOLATE", "FERRITIN", "TIBC", "IRON", "RETICCTPCT" in the last 72 hours.  Urine analysis:    Component Value Date/Time   COLORURINE YELLOW 01/13/2023 [place] 01/13/2023 1334   LABSPEC 1.020 01/13/2023 1334   PHURINE 6.0  01/13/2023 1334   GLUCOSEU >=500 (A) 01/13/2023 1334   HGBUR SMALL (A) 01/13/2023 1334   BILIRUBINUR NEGATIVE 01/13/2023 1334   KETONESUR NEGATIVE 01/13/2023 1334   PROTEINUR 30 (A) 01/13/2023 1334   NITRITE NEGATIVE 01/13/2023 1334   LEUKOCYTESUR NEGATIVE 01/13/2023 1334   Sepsis Labs: Invalid input(s): "PROCALCITONIN", "LACTICIDVEN"  Microbiology: Recent Results (from the past 240 hour(s))  Culture, blood (Routine X 2)  w Reflex to ID Panel     Status: None   Collection Time: 01/22/23  4:51 PM   Specimen: BLOOD LEFT HAND  Result Value Ref Range Status   Specimen Description BLOOD LEFT HAND  Final   Special Requests   Final    BOTTLES DRAWN AEROBIC AND ANAEROBIC Blood Culture adequate volume   Culture   Final    NO GROWTH 5 DAYS Performed at [facility], [address], [place], [place] [address]    Report Status 01/27/2023 FINAL  Final  Culture, blood (Routine X 2) w Reflex to ID Panel     Status: None   Collection Time: 01/22/23  4:51 PM   Specimen: BLOOD RIGHT HAND  Result Value Ref Range Status   Specimen Description BLOOD RIGHT HAND  Final   Special Requests   Final    BOTTLES DRAWN AEROBIC AND ANAEROBIC Blood Culture adequate volume   Culture   Final    NO GROWTH 5 DAYS Performed at [facility], [place] [address], [place],  [address]    Report Status 01/27/2023 FINAL  Final    Radiology Studies: No results found.    Gaberiel Youngblood T. [place]  If 7PM-7AM, please contact night-coverage [email or web address] 01/31/2023, 3:48 PM

## 2023-01-31 NOTE — TOC Progression Note (Signed)
Transition of Care [facility]) - Progression Note    Patient Details  Name: Curtis Ramos MRN: [id number] Date of Birth: 1932-08-02  Transition of [facility]) CM/SW [place], [place] Phone Number: 01/31/2023, 2:16 PM  Clinical Narrative:     CSW following to fax patient out for SNF closer to patient being medically ready. CSW will continue to follow and assist with patients dc planning needs.   Expected Discharge Plan: [place] Barriers to Discharge: Continued Medical Work up  Expected Discharge Plan and Services In-house Referral: Clinical Social Work Discharge Planning Services: CM Consult Post Acute Care Choice: Long Term Acute Care (LTAC) Living arrangements for the past 2 months: Single Family Home                                       Social Determinants of Health (SDOH) Interventions SDOH Screenings   Food Insecurity: No Food Insecurity (01/18/2023)  Housing: Low Risk  (01/18/2023)  Transportation Needs: No Transportation Needs (01/18/2023)  Utilities: Not At Risk (01/18/2023)  Financial Resource Strain: Low Risk  (01/18/2023)  Tobacco Use: Low Risk  (01/13/2023)    Readmission Risk Interventions     No data to display

## 2023-01-31 NOTE — Progress Notes (Signed)
Occupational Therapy Treatment Patient Details Name: Curtis Ramos MRN: [id number] DOB: [birth year] Today's Date: 01/31/2023   History of present illness Pt is 89 year old presented to [facility] on  01/13/23 after unwitnessed fall at work. In ED pt had PEA arrest with CPR x 9 minutes and intubated. Pt with respiratory failure and septic shock due to pulmonary edema, atelectasis post CPR rib/sternal fxs, PNA, and acute on chronic heart failure. Failed extubation 3/3 and reintubated until extubated 3/6. PMH - HTN, heart failure, afib, thyroid CA   OT comments  Pt progressing towards OT goals this session. Co-tx for progressing mobility. When therapy team entered the room, Pt was bathing at bed level with RN. Min A to don gown and max A for socks, mod A for bed mobility. Pt reporting lightheadedness with positional changes (supine>sit>stand) but after breathing resolves. Pt able to ambulate in room with mod A +2 to modA with chair follow and RW. Pt family unable to provide necessary 24 hour support and so they are requesting SNF vs AIR. To honor family wishes DC recommendation updated to SNF. OT will continue to follow acutely to maximize safety and independence in ADL and functional transfers.    Recommendations for follow up therapy are one component of a multi-disciplinary discharge planning process, led by the attending physician.  Recommendations may be updated based on patient status, additional functional criteria and insurance authorization.    Follow Up Recommendations  Skilled nursing-short term rehab (<3 hours/day)     Assistance Recommended at Discharge Frequent or constant Supervision/Assistance  Patient can return home with the following  A lot of help with walking and/or transfers;A lot of help with bathing/dressing/bathroom;Assistance with cooking/housework;Direct supervision/assist for medications management;Assistance with feeding;Direct supervision/assist for financial management;Assist  for transportation;Help with stairs or ramp for entrance   Equipment Recommendations  Other (comment) (defer to next venue of care)    Recommendations for Other Services      Precautions / Restrictions Precautions Precautions: Fall Precaution Comments: cortrak Restrictions Weight Bearing Restrictions: No       Mobility Bed Mobility Overal bed mobility: Needs Assistance Bed Mobility: Rolling, Supine to Sit Rolling: Mod assist   Supine to sit: +2 for physical assistance, Mod assist     General bed mobility comments: Assist to bring shoulders over, elevate trunk into sitting and bring hips to EOB.    Transfers Overall transfer level: Needs assistance Equipment used: Rolling walker (2 wheels) Transfers: Sit to/from Stand Sit to Stand: +2 physical assistance, Min assist           General transfer comment: Cues for hand placement on RW. Cues for upright posture when standing. Pt requires increased time for processing 1 step commands.     Balance Overall balance assessment: Needs assistance Sitting-balance support: Feet supported, No upper extremity supported, Bilateral upper extremity supported Sitting balance-Leahy Scale: Fair     Standing balance support: Bilateral upper extremity supported, During functional activity Standing balance-Leahy Scale: Poor Standing balance comment: walker and min to mod assist for static standing                           ADL either performed or assessed with clinical judgement   ADL Overall ADL's : Needs assistance/impaired Eating/Feeding: NPO   Grooming: Set up;Sitting;Wash/dry face Grooming Details (indicate cue type and reason): in recliner             Lower Body Dressing: Maximal assistance;Bed level Lower  Body Dressing Details (indicate cue type and reason): to don socks Toilet Transfer: Minimal assistance;Moderate assistance;+2 for physical assistance;+2 for safety/equipment;Ambulation;Rolling walker (2  wheels) Toilet Transfer Details (indicate cue type and reason): mod A +2 for initial sit<>stand, improved with each transition Toileting- Clothing Manipulation and Hygiene: Maximal assistance;Bed level Toileting - Clothing Manipulation Details (indicate cue type and reason): getting cleaned up with RN upon initial arrival     Functional mobility during ADLs: Moderate assistance;+2 for physical assistance;+2 for safety/equipment;Cueing for sequencing;Cueing for safety;Rolling walker (2 wheels) (cadence helped initiation) General ADL Comments: impaired cognition, fatigue, activity tolerance, balance    Extremity/Trunk Assessment              Vision       Perception     Praxis      Cognition Arousal/Alertness: Awake/alert Behavior During Therapy: WFL for tasks assessed/performed Overall Cognitive Status: Impaired/Different from baseline Area of Impairment: Following commands, Safety/judgement, Awareness, Problem solving, Memory                       Following Commands: Follows one step commands with increased time, Follows one step commands consistently Safety/Judgement: Decreased awareness of safety, Decreased awareness of deficits Awareness: Emergent Problem Solving: Decreased initiation, Requires verbal cues, Difficulty sequencing General Comments: deficits in balance, dizziness with positional changes (no nysgmus) and improves with time. does better with cadence and explicit instructions. needing less multimodal cues        Exercises      Shoulder Instructions       General Comments VSS on RA    Pertinent Vitals/ Pain       Pain Assessment Pain Assessment: No/denies pain Pain Intervention(s): Monitored during session  Home Living                                          Prior Functioning/Environment              Frequency  Min 2X/week        Progress Toward Goals  OT Goals(current goals can now be found in the care  plan section)  Progress towards OT goals: Progressing toward goals  Acute Rehab OT Goals Patient Stated Goal: get stronger, get better OT Goal Formulation: With patient Time For Goal Achievement: 02/07/23 Potential to Achieve Goals: Good  Plan Discharge plan needs to be updated (by family request)    Co-evaluation      Reason for Co-Treatment: Complexity of the patient's impairments (multi-system involvement) PT goals addressed during session: Mobility/safety with mobility;Proper use of DME        AM-PAC OT "6 Clicks" Daily Activity     Outcome Measure   Help from another person eating meals?: Total (NPO) Help from another person taking care of personal grooming?: A Little Help from another person toileting, which includes using toliet, bedpan, or urinal?: A Lot Help from another person bathing (including washing, rinsing, drying)?: A Lot Help from another person to put on and taking off regular upper body clothing?: A Little Help from another person to put on and taking off regular lower body clothing?: A Lot 6 Click Score: 13    End of Session Equipment Utilized During Treatment: Gait belt;Rolling walker (2 wheels)  OT Visit Diagnosis: Unsteadiness on feet (R26.81);Other abnormalities of gait and mobility (R26.89);Muscle weakness (generalized) (M62.81);History of falling (Z91.81)   Activity Tolerance Patient  tolerated treatment well   Patient Left in chair;with call bell/phone within reach;with chair alarm set   Nurse Communication Mobility status        Time: [id number] OT Time Calculation (min): 28 min  Charges: OT General Charges $OT Visit: 1 Visit OT Treatments $Self Care/Home Management : 8-22 mins  [place] Office: [place] 01/31/2023, 1:45 PM

## 2023-01-31 NOTE — Progress Notes (Signed)
Speech Language Pathology Treatment: Dysphagia  Patient Details Name: Curtis Ramos MRN: [id number] DOB: 1932/02/14 Today's Date: 01/31/2023 Time: [id number] SLP Time Calculation (min) (ACUTE ONLY): 16 min  Assessment / Plan / Recommendation Clinical Impression  SLP returned to pt's room to provide education and hang side above the bed with recommendations from MBS. Education about diet and precautions was provided to pt and his youngest son, who was present at bedside. We again discussed dysphagia and potential risks, but pt and family would like to try to avoid PEG. His son was already acknowledging strategies and trying to reinforce them with the pt. Family believes that they can be present for most of his meals to try to help increase use of precautions, but they are hopeful that pt can have a little time still in the hospital to ensure that he is not having overt difficulty with PO diet (MD made aware of request). All questions answered at this time.    HPI HPI: 89 year old man who presented to [facility] ED 2/29 via EMS after unwitnessed fall at work, patient did not have any complaints prior to fall. He was found down between a radiator and the wall, unresponsive with contusion and deformities to R side of his head. Initially with tachycardic with EMS, then bradycardic. ST elevation noted on EKG and Code STEMI was called, later called off as ST elevation had resolved. Patient lost pulses while in ED and became apneic with cardiac monitoring showing PEA arrest. CPR administered x 9 minutes with Epi x 4 and ROSC, patient never had a shockable rhythm. Intubated peri-arrest 2/28-3/3 then reintubated until 3/6.Marland KitchenPMHx significant for HTN, HFrEF with NICM (cMRI 12/2022 with severe LV dilation, EF 17%, LBBB, followed by Dr. Haroldine Laws), AFib (on Eliquis), MAI infection, GERD, hypothyroidism, history of thyroid CA.      SLP Plan  Continue with current plan of care      Recommendations for follow up therapy are  one component of a multi-disciplinary discharge planning process, led by the attending physician.  Recommendations may be updated based on patient status, additional functional criteria and insurance authorization.    Recommendations  Diet recommendations: Dysphagia 1 (puree);Nectar-thick liquid Liquids provided via: Cup;Straw Medication Administration: Crushed with puree Supervision: Staff to assist with self feeding;Full supervision/cueing for compensatory strategies Compensations: Minimize environmental distractions;Slow rate;Small sips/bites;Hard cough after swallow;Multiple dry swallows after each bite/sip (swallow, cough, swallow) Postural Changes and/or Swallow Maneuvers: Seated upright 90 degrees                Oral Care Recommendations: Oral care BID Follow Up Recommendations: Skilled nursing-short term rehab (<3 hours/day) Assistance recommended at discharge: Frequent or constant Supervision/Assistance SLP Visit Diagnosis: Dysphagia, oropharyngeal phase (R13.12) Plan: Continue with current plan of care           Osie Bond., M.A. [place] Office [phone]  Secure chat preferred   01/31/2023, 5:37 PM

## 2023-01-31 NOTE — Progress Notes (Signed)
Speech Language Pathology Treatment: Dysphagia  Patient Details Name: BRONDON BRAZ MRN: [id number] DOB: [birth year] Today's Date: 01/31/2023 Time: 1000-1020 SLP Time Calculation (min) (ACUTE ONLY): 20 min  Assessment / Plan / Recommendation Clinical Impression  Pt demonstrates overt subjective improvement since last week. He is alert, mobile and walking with PT/OT. He has no audible secretions at baseline and cough strength appears stronger. When completing EMST pt able to progress to 20 cm H20 and completed 30 repetitions without difficulty. He has been tolerating liberal consumption of ice and oral hygiene is good. SLP observed pt consumed 4 oz of nectar thick liquids without coughing or throat clearing or wet vocal quality. There is good evidence to support improvement in dysphagia and repeat MBS is warranted to determine pts ability to resume oral intake. Reported to MD.   HPI        SLP Plan  MBS      Recommendations for follow up therapy are one component of a multi-disciplinary discharge planning process, led by the attending physician.  Recommendations may be updated based on patient status, additional functional criteria and insurance authorization.    Recommendations                   Oral Care Recommendations: Oral care QID Follow Up Recommendations: Skilled nursing-short term rehab (<3 hours/day) Assistance recommended at discharge: Frequent or constant Supervision/Assistance SLP Visit Diagnosis: Dysphagia, oropharyngeal phase (R13.12) Plan: MBS           Dontrez Pettis, [name]  01/31/2023, 10:27 AM

## 2023-02-01 DIAGNOSIS — A419 Sepsis, unspecified organism: Secondary | ICD-10-CM | POA: Diagnosis not present

## 2023-02-01 DIAGNOSIS — I469 Cardiac arrest, cause unspecified: Secondary | ICD-10-CM | POA: Diagnosis not present

## 2023-02-01 DIAGNOSIS — E43 Unspecified severe protein-calorie malnutrition: Secondary | ICD-10-CM | POA: Diagnosis not present

## 2023-02-01 DIAGNOSIS — J95851 Ventilator associated pneumonia: Secondary | ICD-10-CM | POA: Diagnosis not present

## 2023-02-01 LAB — GLUCOSE, CAPILLARY
Glucose-Capillary: 101 mg/dL — ABNORMAL HIGH (ref 70–99)
Glucose-Capillary: 102 mg/dL — ABNORMAL HIGH (ref 70–99)
Glucose-Capillary: 113 mg/dL — ABNORMAL HIGH (ref 70–99)
Glucose-Capillary: 119 mg/dL — ABNORMAL HIGH (ref 70–99)
Glucose-Capillary: 56 mg/dL — ABNORMAL LOW (ref 70–99)
Glucose-Capillary: 93 mg/dL (ref 70–99)
Glucose-Capillary: 96 mg/dL (ref 70–99)

## 2023-02-01 MED ORDER — PROSOURCE PLUS PO LIQD
30.0000 mL | Freq: Every day | ORAL | Status: DC
  Administered 2023-02-01: 30 mL via ORAL
  Filled 2023-02-01 (×3): qty 30

## 2023-02-01 MED ORDER — ENSURE ENLIVE PO LIQD
237.0000 mL | Freq: Three times a day (TID) | ORAL | Status: DC
  Administered 2023-02-01 – 2023-02-02 (×4): 237 mL via ORAL
  Administered 2023-02-03: 200 mL via ORAL
  Administered 2023-02-03 – 2023-02-07 (×10): 237 mL via ORAL

## 2023-02-01 MED ORDER — AMIODARONE HCL 200 MG PO TABS
200.0000 mg | ORAL_TABLET | Freq: Every day | ORAL | Status: DC
  Administered 2023-02-01 – 2023-02-11 (×11): 200 mg via ORAL
  Filled 2023-02-01 (×11): qty 1

## 2023-02-01 MED ORDER — POLYETHYLENE GLYCOL 3350 17 G PO PACK: 17.0000 g | PACK | Freq: Every day | ORAL | Status: DC | PRN

## 2023-02-01 MED ORDER — OSMOLITE 1.5 CAL PO LIQD
650.0000 mL | ORAL | Status: DC
  Administered 2023-02-01: 650 mL
  Filled 2023-02-01 (×5): qty 711

## 2023-02-01 MED ORDER — SENNOSIDES-DOCUSATE SODIUM 8.6-50 MG PO TABS: 2.0000 | ORAL_TABLET | Freq: Every evening | ORAL | Status: DC | PRN

## 2023-02-01 MED ORDER — SPIRONOLACTONE 12.5 MG HALF TABLET
12.5000 mg | ORAL_TABLET | Freq: Every day | ORAL | Status: DC
  Administered 2023-02-01 – 2023-02-11 (×11): 12.5 mg via ORAL
  Filled 2023-02-01 (×11): qty 1

## 2023-02-01 MED ORDER — LEVOTHYROXINE SODIUM 75 MCG PO TABS
175.0000 ug | ORAL_TABLET | Freq: Every day | ORAL | Status: DC
  Administered 2023-02-01 – 2023-02-11 (×10): 175 ug via ORAL
  Filled 2023-02-01 (×10): qty 1

## 2023-02-01 MED ORDER — MIRTAZAPINE 15 MG PO TABS
7.5000 mg | ORAL_TABLET | Freq: Every day | ORAL | Status: DC
  Administered 2023-02-01 – 2023-02-10 (×9): 7.5 mg via ORAL
  Filled 2023-02-01 (×10): qty 1

## 2023-02-01 MED ORDER — DOCUSATE SODIUM 50 MG/5ML PO LIQD: 100.0000 mg | Freq: Two times a day (BID) | ORAL | Status: DC | PRN

## 2023-02-01 NOTE — Care Management Important Message (Signed)
Important Message  Patient Details  Name: AKON BERTANI MRN: [id number] Date of Birth: [birth year]   Medicare Important Message Given:  Yes     Shelda Altes 02/01/2023, 10:58 AM

## 2023-02-01 NOTE — Progress Notes (Signed)
Initial Nutrition Assessment  DOCUMENTATION CODES:   Severe malnutrition in context of chronic illness  INTERVENTION:  - Modify TF regimen to nocturnal feeds of Osmolite 1.5 @ 65 mL/hr over 10 hrs (650 mL) (8PM-6AM).  - This will provide 975 kcals, 40 gm protein, and 495 mL free water.  - Keep water flushes at 200 mL Q4H - this will provide a total of 1695 mL free water total.   - Add Ensure Enlive po TID, each supplement provides 350 kcal and 20 grams of protein.  NUTRITION DIAGNOSIS:   Severe Malnutrition related to chronic illness (CHF) as evidenced by severe fat depletion, severe muscle depletion. - Ongoing   GOAL:   Patient will meet greater than or equal to 90% of their needs  MONITOR:   Labs, Vent status, TF tolerance  REASON FOR ASSESSMENT:   Ventilator, Consult Enteral/tube feeding initiation and management  ASSESSMENT:   Pt with hx of CHF, HTN, GERD, and PAF presented to ED after being found down with a contusion and deformities to the right side of the head. Suffered PEA arrest in ED and CPR x 9 minutes.  Meds reviewed:  remeron, aldactone. Labs reviewed.   Pt remains with Cortrak in place. At time of assessment, tube feeds were running as ordered. RN reports that the pt ate about 50% of his breakfast this am. Pt was very confused at time of assessment and unable to answer questions. Diet was advanced to Dys 1, Nectar thick liquids yesterday afternoon. Now that pt's diet is advanced and pt is eating 50% of his meals, RD will modify EN regimen to nocturnal feeds that provide 50% of estimated needs. Will continue to monitor PO intakes and POC. RD discussed changes with RN.   Diet Order:   Diet Order             DIET - DYS 1 Room service appropriate? No; Fluid consistency: Nectar Thick  Diet effective now                   EDUCATION NEEDS:   Not appropriate for education at this time  Skin:  Skin Assessment: Skin Integrity Issues: Skin Integrity  Issues:: Other (Comment) Other: laceration to head  Last BM:  3/19 - Type 6  Height:   Ht Readings from Last 1 Encounters:  01/18/23 5\' 11"  (1.803 m)    Weight:   Wt Readings from Last 1 Encounters:  02/01/23 57.3 kg    Ideal Body Weight:  78.2 kg  BMI:  Body mass index is 17.62 kg/m.  Estimated Nutritional Needs:   Kcal:  1900-2100 kcal/d  Protein:  95-105g/d  Fluid:  1.8-2L/d  Thalia Bloodgood, RD, LDN, CNSC.

## 2023-02-01 NOTE — Progress Notes (Signed)
PROGRESS NOTE  Curtis Ramos [id number] DOB: November 02, 1932   PCP: Leonides Sake, MD  Patient is from: Home.  DOA: 01/13/2023 LOS: 21  Chief complaints No chief complaint on file.    Brief Narrative / Interim history: 89 year old M with PMH of systolic CHF, NICM, A-fib on Eliquis, HTN, hypothyroidism, thyroid cancer and GERD presented to ED on 2/29 after unwitnessed fall at work.  As per the PCCM notes patient was found down between the radiator and the wall unresponsive with contusion.  On arrival to ED patient became apneic and lost pulses and cardiac monitoring showed PEA arrest.  CPR administered for 9 minutes with epi x 4 and ROSC achieved.  Patient was intubated and admitted to ICU. cardiology was consulted. CT of the chest abdomen and pelvis showed known displaced transverse fractures of the mid lower sternal body, acute fracture of the coastal cartilage of bilateral second through sixth ribs, acute fracture of the anterior second to 7 ribs.  He was started on Unasyn for aspiration pneumonitis, later transitioned to cefepime for Pseudomonas VAP.    Patient was extubated on 01/19/2023, and transferred to [facility] on 01/21/2023. On 3/9, he became tachypneic and requiried BIPAP for 24 hours, and transitioned to HF oxygen.  Patient was weaned off oxygen to room air on 3/12.  Remains on TF via cortrack due to significant risk for aspiration.  Hospital course significant for delirium that seems to have resolved.  After discussion with family, IR was consulted for G-tube.  However, SLP reevaluated patient and advanced diet to dysphagia-1.  Family wants to hold off G-tube for now.  Patient has good p.o. intake.  Tube feed decreased to nocturnal.     Subjective: Seen and examined earlier this morning.  No major events overnight of this morning.  No complaints.  1 L of emesis mistakenly charted from overnight.  Patient's RN reports good p.o. intake.  RD changed tube feeds to nocturnal for better  appetite.  Objective: Vitals:   02/01/23 0031 02/01/23 0416 02/01/23 0417 02/01/23 1230  BP: 113/60 (!) 126/59  126/74  Pulse: 71 72  68  Resp:  20  19  Temp: (!) 97.2 F (36.2 C) 97.8 F (36.6 C)  (!) 97.5 F (36.4 C)  TempSrc: Oral Oral  Oral  SpO2: 99% 99%    Weight:   57.3 kg   Height:        Examination: GENERAL: Appears frail.  Nontoxic. HEENT: MMM.  Vision and hearing grossly intact.  Feeding tube in nose. NECK: Supple.  No apparent JVD.  RESP:  No IWOB.  Fair aeration bilaterally. CVS:  RRR. Heart sounds normal.  ABD/GI/GU: BS+. Abd soft, NTND.  MSK/EXT:   No apparent deformity. Moves extremities. No edema.  SKIN: no apparent skin lesion or wound NEURO: Awake and alert. Oriented appropriately.  No apparent focal neuro deficit. PSYCH: Calm. Normal affect.   Procedures:  Intubation and mechanical ventilation  Microbiology summarized: 3/3-respiratory culture with Pseudomonas aeruginosa. 3/9-blood cultures NGTD  Assessment and plan: Principal Problem:   Cardiac arrest ([place]) Active Problems:   AKI (acute kidney injury) ([place])   Chronic systolic CHF (congestive heart failure) (HCC)   Hypothyroidism   Septic shock (HCC)   Acute respiratory failure with hypoxia (HCC)   Protein-calorie malnutrition, severe   Hematochezia   VAP (ventilator-associated pneumonia) (HCC)   Delirium   Oropharyngeal dysphagia   Elevated liver enzymes   Acute urinary retention  Dysphagia: -SLP started dysphagia 1 diet.  Family  likes to hold off G-tube now -Per RN, good p.o. intake.  RD added oral supplements and change TF to nocturnal.  PEA arrest/ Cardiogenic shock: Weaned off vasopressors.  -Management of CHF as below   NICM/Chronic systolic heart failure: TTE with LVEF of 20%.  Euvolemic.  On RA. -Continue Aldactone.  Monitor K intermittently -Cardiology signed off.  Acute respiratory failure with hypoxia: Multifactorial-PEA arrest, sternal and rib fractures and aspiration  pneumonia. -Extubated on 3/6.  Currently on room air. -Pulmonary toilet and aspiration precaution  Septic shock due to Pseudomonas VAP/COVID : Resolved.  Blood cultures NGTD. -Completing course of IV cefepime on 3/13   Chronic atrial fibrillation with frequent PVC's -On amiodarone 200 mg daily.  -Not on anticoagulation due to hematochezia.   Hematochezia: Reportedly had intermittent hematochezia, and anticoagulation discontinued.  Not a candidate for intervention or procedure per GI.  H&H stable now.  Anemia panel suggests anemia of chronic disease. Recent Labs    01/22/23 1019 01/23/23 0120 01/24/23 0213 01/25/23 0130 01/25/23 1325 01/25/23 1347 01/26/23 0147 01/27/23 0143 01/28/23 0246 01/30/23 0212  HGB 10.5* 10.4* 9.2* 9.4* 9.7* 9.5* 10.4* 9.3* 9.6* 10.4*  -Monitor intermittently.   Hypothyroidism/history of thyroid cancer:  -Continue home Synthroid -Recheck TSH outpatient  Hypernatremia/hypophosphatemia/hypocalcemia: Resolved.   AKI due to shock: Resolved.   Urinary retention: Seems to have resolved.  Passed voiding trial.  Elevated liver enzymes: Could be ischemic or congestive.  Delirium: Stable.  Fairly oriented but limited insight. -Delirium precaution   Severe malnutrition Body mass index is 17.62 kg/m. Nutrition Problem: Severe Malnutrition Etiology: chronic illness (CHF) Signs/Symptoms: severe fat depletion, severe muscle depletion Interventions: Refer to RD note for recommendations   DVT prophylaxis:  enoxaparin (LOVENOX) injection 40 mg Start: 01/27/23 2200 SCDs Start: 01/13/23 1327  Code Status: Full code Family Communication: None at bedside today. Level of care: Med-Surg Status is: Inpatient Remains inpatient appropriate because: Dysphagia and decreased oral intake   Final disposition: SNF Consultants:  Cardiology GI Pulmonology Palliative medicine  35 minutes with more than 50% spent in reviewing records, counseling patient/family  and coordinating care.   Sch Meds:  Scheduled Meds:  amiodarone  200 mg Oral Daily   enoxaparin (LOVENOX) injection  40 mg Subcutaneous Q24H   feeding supplement  237 mL Oral TID BM   feeding supplement (OSMOLITE 1.5 CAL)  650 mL Per Tube Q24H   free water  200 mL Per Tube Q4H   levothyroxine  175 mcg Oral Q0600   lidocaine  1 patch Transdermal Q24H   mirtazapine  7.5 mg Oral QHS   mouth rinse  15 mL Mouth Rinse 4 times per day   pantoprazole (PROTONIX) IV  40 mg Intravenous Q12H   spironolactone  12.5 mg Oral Daily   Continuous Infusions:  sodium chloride Stopped (01/25/23 1632)   PRN Meds:.docusate, HYDROmorphone (DILAUDID) injection, lip balm, ondansetron (ZOFRAN) IV, mouth rinse, polyethylene glycol, senna-docusate  Antimicrobials: Anti-infectives (From admission, onward)    Start     Dose/Rate Route Frequency Ordered Stop   01/17/23 1100  ceFEPIme (MAXIPIME) 2 g in sodium chloride 0.9 % 100 mL IVPB        2 g 200 mL/hr over 30 Minutes Intravenous Every 12 hours 01/17/23 1040 01/26/23 2359   01/16/23 1400  cefTRIAXone (ROCEPHIN) 2 g in sodium chloride 0.9 % 100 mL IVPB  Status:  Discontinued        2 g 200 mL/hr over 30 Minutes Intravenous Every 24 hours 01/16/23 1136 01/17/23  1028   01/15/23 0815  Ampicillin-Sulbactam (UNASYN) 3 g in sodium chloride 0.9 % 100 mL IVPB  Status:  Discontinued        3 g 200 mL/hr over 30 Minutes Intravenous Every 6 hours 01/15/23 0804 01/16/23 1136   01/13/23 1345  Ampicillin-Sulbactam (UNASYN) 3 g in sodium chloride 0.9 % 100 mL IVPB  Status:  Discontinued        3 g 200 mL/hr over 30 Minutes Intravenous Every 12 hours 01/13/23 1339 01/15/23 0804        I have personally reviewed the following labs and images: CBC: Recent Labs  Lab 01/26/23 0147 01/27/23 0143 01/28/23 0246 01/30/23 0212  WBC 11.0* 10.7* 10.4 10.5  HGB 10.4* 9.3* 9.6* 10.4*  HCT 30.7* 28.7* 29.7* 31.7*  MCV 109.3* 112.1* 110.8* 110.8*  PLT 333 367 406* 417*    BMP &GFR Recent Labs  Lab 01/26/23 0147 01/27/23 0143 01/28/23 0246 01/30/23 0212  NA 143 144 140 142  K 3.9 3.8 3.7 4.2  CL 114* 110 111 111  CO2 21* 24 24 23   GLUCOSE 128* 115* 110* 108*  BUN 30* 34* 33* 33*  CREATININE 0.92 1.03 1.03 1.01  CALCIUM 10.0 9.8 9.8 10.2  MG  --  2.4 2.5* 2.5*  PHOS 3.2 2.7 2.5 2.9   Estimated Creatinine Clearance: 41 mL/min (by C-G formula based on SCr of 1.01 mg/dL). Liver & Pancreas: Recent Labs  Lab 01/26/23 0147 01/27/23 0143 01/28/23 0246 01/30/23 0212  AST 53* 51* 47* 37  ALT 117* 109* 101* 80*  ALKPHOS 125 130* 138* 153*  BILITOT 0.3 0.6 0.6 0.4  PROT 6.6 6.1* 6.5 6.7  ALBUMIN 2.2* 2.2* 2.3* 2.5*   No results for input(s): "LIPASE", "AMYLASE" in the last 168 hours. No results for input(s): "AMMONIA" in the last 168 hours. Diabetic: No results for input(s): "HGBA1C" in the last 72 hours. Recent Labs  Lab 01/31/23 2052 02/01/23 0034 02/01/23 0421 02/01/23 0829 02/01/23 1228  GLUCAP 117* 113* 102* 119* 93   Cardiac Enzymes: Recent Labs  Lab 01/27/23 0143  CKTOTAL 60   Recent Labs    06/18/22 1331 10/05/22 0848  PROBNP 1,795* 3,434*   Coagulation Profile: No results for input(s): "INR", "PROTIME" in the last 168 hours. Thyroid Function Tests: No results for input(s): "TSH", "T4TOTAL", "FREET4", "T3FREE", "THYROIDAB" in the last 72 hours. Lipid Profile: No results for input(s): "CHOL", "HDL", "LDLCALC", "TRIG", "CHOLHDL", "LDLDIRECT" in the last 72 hours. Anemia Panel: No results for input(s): "VITAMINB12", "FOLATE", "FERRITIN", "TIBC", "IRON", "RETICCTPCT" in the last 72 hours.  Urine analysis:    Component Value Date/Time   COLORURINE YELLOW 01/13/2023 [place] 01/13/2023 1334   LABSPEC 1.020 01/13/2023 1334   PHURINE 6.0 01/13/2023 1334   GLUCOSEU >=500 (A) 01/13/2023 1334   HGBUR SMALL (A) 01/13/2023 1334   BILIRUBINUR NEGATIVE 01/13/2023 1334   KETONESUR NEGATIVE 01/13/2023 1334    PROTEINUR 30 (A) 01/13/2023 1334   NITRITE NEGATIVE 01/13/2023 1334   LEUKOCYTESUR NEGATIVE 01/13/2023 1334   Sepsis Labs: Invalid input(s): "PROCALCITONIN", "LACTICIDVEN"  Microbiology: Recent Results (from the past 240 hour(s))  Culture, blood (Routine X 2) w Reflex to ID Panel     Status: None   Collection Time: 01/22/23  4:51 PM   Specimen: BLOOD LEFT HAND  Result Value Ref Range Status   Specimen Description BLOOD LEFT HAND  Final   Special Requests   Final    BOTTLES DRAWN AEROBIC AND ANAEROBIC Blood Culture  adequate volume   Culture   Final    NO GROWTH 5 DAYS Performed at [facility], [place] [address] [place], [place] [address]    Report Status 01/27/2023 FINAL  Final  Culture, blood (Routine X 2) w Reflex to ID Panel     Status: None   Collection Time: 01/22/23  4:51 PM   Specimen: BLOOD RIGHT HAND  Result Value Ref Range Status   Specimen Description BLOOD RIGHT HAND  Final   Special Requests   Final    BOTTLES DRAWN AEROBIC AND ANAEROBIC Blood Culture adequate volume   Culture   Final    NO GROWTH 5 DAYS Performed at [facility], [place] [address], [place], [place] [address]    Report Status 01/27/2023 FINAL  Final    Radiology Studies: No results found.    Honor Frison T. McDuffie  If 7PM-7AM, please contact night-coverage [email or web address] 02/01/2023, 2:52 PM

## 2023-02-01 NOTE — TOC Progression Note (Signed)
Transition of Care [facility]) - Progression Note    Patient Details  Name: GIOACCHINO LAESSIG MRN: [id number] Date of Birth: [birth year]  Transition of Care [facility]) CM/SW [place], [place] Phone Number: 02/01/2023, 1:51 PM  Clinical Narrative:     CSW following to fax patient out for SNF closer to patient being medically ready. Patient currently has cortrak.CSW will continue to follow and assist with patients dc planning needs.   Expected Discharge Plan: [place] Barriers to Discharge: Continued Medical Work up  Expected Discharge Plan and Services In-house Referral: Clinical Social Work Discharge Planning Services: CM Consult Post Acute Care Choice: Long Term Acute Care (LTAC) Living arrangements for the past 2 months: Single Family Home                                       Social Determinants of Health (SDOH) Interventions SDOH Screenings   Food Insecurity: No Food Insecurity (01/18/2023)  Housing: Low Risk  (01/18/2023)  Transportation Needs: No Transportation Needs (01/18/2023)  Utilities: Not At Risk (01/18/2023)  Financial Resource Strain: Low Risk  (01/18/2023)  Tobacco Use: Low Risk  (01/13/2023)    Readmission Risk Interventions     No data to display

## 2023-02-01 NOTE — Progress Notes (Signed)
Speech Language Pathology Treatment: Dysphagia  Patient Details Name: Curtis Ramos MRN: [id number] DOB: [birth year] Today's Date: 02/01/2023 Time: [id number] SLP Time Calculation (min) (ACUTE ONLY): 31 min  Assessment / Plan / Recommendation Clinical Impression  Pt progressing nicely towards dysphagia goals and son Curtis Ramos arrived for last half of session. Pt was able to independently recall strategy of cough after bites and sips and was able to perform with approximately 90% accuracy. He did need more cueing for subsequent swallow after cough. Pt self fed and taking small sips without cues. Unable to fully determine however all coughs appeared intentional and not reflexive for inadequate airway protection. Did educate pt and son that he may still have instances of airway intrusion however he appeared to be tolerating current recommendations of puree and nectar thick liquids from clinical standpoint. Son stated he helped his dad with the swallow strategies last night with dinner.  Pt also performed EMST (expiratory muscle strength training) with device set at the highest setting at 20 cm H20 for 3 sets of 10 effectively. Educated son re: clinical reasoning and use of device and to continue to encourage pt to use 2-3 times a day. He has improved as last time this therapist saw pt he was only able to perform with device set at 10 cm H20. Continue ST.    HPI HPI: 89 year old man who presented to [facility] ED 2/29 via EMS after unwitnessed fall at work, patient did not have any complaints prior to fall. He was found down between a radiator and the wall, unresponsive with contusion and deformities to R side of his head. Initially with tachycardic with EMS, then bradycardic. ST elevation noted on EKG and Code STEMI was called, later called off as ST elevation had resolved. Patient lost pulses while in ED and became apneic with cardiac monitoring showing PEA arrest. CPR administered x 9 minutes with Epi x 4 and ROSC,  patient never had a shockable rhythm. Intubated peri-arrest 2/28-3/3 then reintubated until 3/6.Marland KitchenPMHx significant for HTN, HFrEF with NICM (cMRI 12/2022 with severe LV dilation, EF 17%, LBBB, followed by Dr. Haroldine Laws), AFib (on Eliquis), MAI infection, GERD, hypothyroidism, history of thyroid CA.      SLP Plan  Continue with current plan of care      Recommendations for follow up therapy are one component of a multi-disciplinary discharge planning process, led by the attending physician.  Recommendations may be updated based on patient status, additional functional criteria and insurance authorization.    Recommendations  Diet recommendations: Dysphagia 1 (puree);Nectar-thick liquid Liquids provided via: Cup;Straw Medication Administration: Crushed with puree Supervision: Staff to assist with self feeding;Full supervision/cueing for compensatory strategies Compensations: Minimize environmental distractions;Slow rate;Small sips/bites;Hard cough after swallow;Other (Comment) (cough then dry swallow) Postural Changes and/or Swallow Maneuvers: Seated upright 90 degrees                Oral Care Recommendations: Oral care BID Follow Up Recommendations: Skilled nursing-short term rehab (<3 hours/day) Assistance recommended at discharge: Frequent or constant Supervision/Assistance SLP Visit Diagnosis: Dysphagia, oropharyngeal phase (R13.12) Plan: Continue with current plan of care           Houston Siren  02/01/2023, 1:00 PM

## 2023-02-02 DIAGNOSIS — E43 Unspecified severe protein-calorie malnutrition: Secondary | ICD-10-CM | POA: Diagnosis not present

## 2023-02-02 DIAGNOSIS — J95851 Ventilator associated pneumonia: Secondary | ICD-10-CM | POA: Diagnosis not present

## 2023-02-02 DIAGNOSIS — A419 Sepsis, unspecified organism: Secondary | ICD-10-CM | POA: Diagnosis not present

## 2023-02-02 DIAGNOSIS — I469 Cardiac arrest, cause unspecified: Secondary | ICD-10-CM | POA: Diagnosis not present

## 2023-02-02 LAB — GLUCOSE, CAPILLARY
Glucose-Capillary: 104 mg/dL — ABNORMAL HIGH (ref 70–99)
Glucose-Capillary: 136 mg/dL — ABNORMAL HIGH (ref 70–99)
Glucose-Capillary: 164 mg/dL — ABNORMAL HIGH (ref 70–99)
Glucose-Capillary: 224 mg/dL — ABNORMAL HIGH (ref 70–99)
Glucose-Capillary: 83 mg/dL (ref 70–99)
Glucose-Capillary: 93 mg/dL (ref 70–99)

## 2023-02-02 MED ORDER — LOSARTAN POTASSIUM 25 MG PO TABS
12.5000 mg | ORAL_TABLET | Freq: Every day | ORAL | Status: DC
  Administered 2023-02-02 – 2023-02-11 (×10): 12.5 mg via ORAL
  Filled 2023-02-02 (×3): qty 0.5
  Filled 2023-02-02: qty 1
  Filled 2023-02-02 (×2): qty 0.5
  Filled 2023-02-02: qty 1
  Filled 2023-02-02: qty 0.5
  Filled 2023-02-02 (×2): qty 1
  Filled 2023-02-02: qty 0.5

## 2023-02-02 NOTE — Progress Notes (Signed)
TRH night cross cover note:   I was notified by RN that the patient's core track is clogged and that efforts to unclog the core track of been unsuccessful thus far.  Will therefore hold tube feeds overnight.    Babs Bertin, DO Hospitalist

## 2023-02-02 NOTE — Progress Notes (Signed)
PROGRESS NOTE  Curtis Ramos [id number] DOB: 1932-11-09   PCP: Leonides Sake, MD  Patient is from: Home.  DOA: 01/13/2023 LOS: 20  Chief complaints No chief complaint on file.    Brief Narrative / Interim history: 89 year old M with PMH of systolic CHF, NICM, A-fib on Eliquis, HTN, hypothyroidism, thyroid cancer and GERD presented to ED on 2/29 after unwitnessed fall at work.  As per the PCCM notes patient was found down between the radiator and the wall unresponsive with contusion.  On arrival to ED patient became apneic and lost pulses and cardiac monitoring showed PEA arrest.  CPR administered for 9 minutes with epi x 4 and ROSC achieved.  Patient was intubated and admitted to ICU. cardiology was consulted. CT of the chest abdomen and pelvis showed known displaced transverse fractures of the mid lower sternal body, acute fracture of the coastal cartilage of bilateral second through sixth ribs, acute fracture of the anterior second to 7 ribs.  He was started on Unasyn for aspiration pneumonitis, later transitioned to cefepime for Pseudomonas VAP.    Patient was extubated on 01/19/2023, and transferred to [facility] on 01/21/2023. On 3/9, he became tachypneic and requiried BIPAP for 24 hours, and transitioned to HF oxygen.  Patient was weaned off oxygen to room air on 3/12.  Remains on TF via cortrack due to significant risk for aspiration.  Hospital course significant for delirium that seems to have resolved.  After discussion with family, IR was consulted for G-tube.  However, SLP reevaluated patient and advanced diet to dysphagia-1.  Family wants to hold off G-tube for now.  Patient has good p.o. intake.  Tube feed decreased to nocturnal.     Subjective: Seen and examined earlier this morning.  No major events overnight of this morning.  No complaints but struggling to feed himself.  Denies chest pain, shortness of breath, GI or UTI symptoms.  Objective: Vitals:   02/01/23 2032 02/01/23  2355 02/02/23 0400 02/02/23 0730  BP: 131/71 110/63 116/60 113/66  Pulse: 64 75 71 71  Resp: 18 20 20 18   Temp: 97.8 F (36.6 C) 97.9 F (36.6 C) 97.8 F (36.6 C) 97.7 F (36.5 C)  TempSrc: Axillary Oral Oral Oral  SpO2: 100% 98% 98%   Weight:   59.8 kg   Height:        Examination:  GENERAL: Appears frail.  No apparent distress.  Nontoxic. HEENT: MMM.  Vision and hearing grossly intact.  Feeding tube in nose. NECK: Supple.  No apparent JVD.  RESP:  No IWOB.  Fair aeration bilaterally. CVS:  RRR. Heart sounds normal.  ABD/GI/GU: BS+. Abd soft, NTND.  MSK/EXT:   No apparent deformity. Moves extremities. No edema.  SKIN: no apparent skin lesion or wound NEURO: Awake and alert. Oriented appropriately.  No apparent focal neuro deficit. PSYCH: Calm. Normal affect.   Procedures:  Intubation and mechanical ventilation  Microbiology summarized: 3/3-respiratory culture with Pseudomonas aeruginosa. 3/9-blood cultures NGTD  Assessment and plan: Principal Problem:   Cardiac arrest ([place]) Active Problems:   AKI (acute kidney injury) ([place])   Chronic systolic CHF (congestive heart failure) (HCC)   Hypothyroidism   Septic shock (HCC)   Acute respiratory failure with hypoxia (HCC)   Protein-calorie malnutrition, severe   Hematochezia   VAP (ventilator-associated pneumonia) (HCC)   Delirium   Oropharyngeal dysphagia   Elevated liver enzymes   Acute urinary retention  Dysphagia: -SLP started dysphagia 1 diet.  Family likes to hold off G-tube  now -Per RN, good p.o. intake.  RD added oral supplements and change TF to nocturnal. -Discontinue free water. -Check renal panel and Mg in the morning  PEA arrest/ Cardiogenic shock: -2/29 PEA arrest--> 9 min CPR 4 rounds epi. Unshockable rhythm.  -Rib fracture 2-7 ribs - Using lidocaine patch.  -Off pressors. Central line out. Weaned off vasopressors. -Continue amiodarone to suppress PVCs.   NICM/Chronic systolic heart failure: TTE  with LVEF of 20%.  Euvolemic.  On RA. -Cardiology following intermittently -On low-dose Aldactone.  Added low-dose losartan.  Acute respiratory failure with hypoxia: Multifactorial-PEA arrest, sternal and rib fractures and aspiration pneumonia. -Extubated on 3/6.  Currently on room air. -Pulmonary toilet and aspiration precaution  Septic shock due to Pseudomonas VAP/COVID : Resolved.  Blood cultures NGTD. -Completing course of IV cefepime on 3/13   Chronic atrial fibrillation with frequent PVC's -On amiodarone 200 mg daily.  -Not on anticoagulation due to hematochezia.   Hematochezia: Reportedly had intermittent hematochezia, and anticoagulation discontinued.  Not a candidate for intervention or procedure per GI.  H&H stable now.  Anemia panel suggests anemia of chronic disease. Recent Labs    01/22/23 1019 01/23/23 0120 01/24/23 0213 01/25/23 0130 01/25/23 1325 01/25/23 1347 01/26/23 0147 01/27/23 0143 01/28/23 0246 01/30/23 0212  HGB 10.5* 10.4* 9.2* 9.4* 9.7* 9.5* 10.4* 9.3* 9.6* 10.4*  -Monitor intermittently.   Hypothyroidism/history of thyroid cancer:  -Continue home Synthroid -Recheck TSH outpatient  Hypernatremia/hypophosphatemia/hypocalcemia: Resolved.   AKI due to shock: Resolved.   Urinary retention: Seems to have resolved.  Passed voiding trial.  Elevated liver enzymes: Could be ischemic or congestive.  Delirium: Stable.  Fairly oriented but limited insight. -Delirium precaution   Severe malnutrition Body mass index is 18.39 kg/m. Nutrition Problem: Severe Malnutrition Etiology: chronic illness (CHF) Signs/Symptoms: severe fat depletion, severe muscle depletion Interventions: Refer to RD note for recommendations   DVT prophylaxis:  enoxaparin (LOVENOX) injection 40 mg Start: 01/27/23 2200 SCDs Start: 01/13/23 1327  Code Status: Full code Family Communication: None at bedside today. Level of care: Med-Surg Status is: Inpatient Remains inpatient  appropriate because: Dysphagia and decreased oral intake   Final disposition: SNF Consultants:  Cardiology GI Pulmonology Palliative medicine  35 minutes with more than 50% spent in reviewing records, counseling patient/family and coordinating care.   Sch Meds:  Scheduled Meds:  amiodarone  200 mg Oral Daily   enoxaparin (LOVENOX) injection  40 mg Subcutaneous Q24H   feeding supplement  237 mL Oral TID BM   feeding supplement (OSMOLITE 1.5 CAL)  650 mL Per Tube Q24H   levothyroxine  175 mcg Oral Q0600   lidocaine  1 patch Transdermal Q24H   losartan  12.5 mg Oral Daily   mirtazapine  7.5 mg Oral QHS   mouth rinse  15 mL Mouth Rinse 4 times per day   pantoprazole (PROTONIX) IV  40 mg Intravenous Q12H   spironolactone  12.5 mg Oral Daily   Continuous Infusions:  sodium chloride Stopped (01/25/23 1632)   PRN Meds:.docusate, HYDROmorphone (DILAUDID) injection, lip balm, ondansetron (ZOFRAN) IV, mouth rinse, polyethylene glycol, senna-docusate  Antimicrobials: Anti-infectives (From admission, onward)    Start     Dose/Rate Route Frequency Ordered Stop   01/17/23 1100  ceFEPIme (MAXIPIME) 2 g in sodium chloride 0.9 % 100 mL IVPB        2 g 200 mL/hr over 30 Minutes Intravenous Every 12 hours 01/17/23 1040 01/26/23 2359   01/16/23 1400  cefTRIAXone (ROCEPHIN) 2 g in sodium chloride  0.9 % 100 mL IVPB  Status:  Discontinued        2 g 200 mL/hr over 30 Minutes Intravenous Every 24 hours 01/16/23 1136 01/17/23 1028   01/15/23 0815  Ampicillin-Sulbactam (UNASYN) 3 g in sodium chloride 0.9 % 100 mL IVPB  Status:  Discontinued        3 g 200 mL/hr over 30 Minutes Intravenous Every 6 hours 01/15/23 0804 01/16/23 1136   01/13/23 1345  Ampicillin-Sulbactam (UNASYN) 3 g in sodium chloride 0.9 % 100 mL IVPB  Status:  Discontinued        3 g 200 mL/hr over 30 Minutes Intravenous Every 12 hours 01/13/23 1339 01/15/23 0804        I have personally reviewed the following labs and  images: CBC: Recent Labs  Lab 01/27/23 0143 01/28/23 0246 01/30/23 0212  WBC 10.7* 10.4 10.5  HGB 9.3* 9.6* 10.4*  HCT 28.7* 29.7* 31.7*  MCV 112.1* 110.8* 110.8*  PLT 367 406* 417*   BMP &GFR Recent Labs  Lab 01/27/23 0143 01/28/23 0246 01/30/23 0212  NA 144 140 142  K 3.8 3.7 4.2  CL 110 111 111  CO2 24 24 23   GLUCOSE 115* 110* 108*  BUN 34* 33* 33*  CREATININE 1.03 1.03 1.01  CALCIUM 9.8 9.8 10.2  MG 2.4 2.5* 2.5*  PHOS 2.7 2.5 2.9   Estimated Creatinine Clearance: 42.8 mL/min (by C-G formula based on SCr of 1.01 mg/dL). Liver & Pancreas: Recent Labs  Lab 01/27/23 0143 01/28/23 0246 01/30/23 0212  AST 51* 47* 37  ALT 109* 101* 80*  ALKPHOS 130* 138* 153*  BILITOT 0.6 0.6 0.4  PROT 6.1* 6.5 6.7  ALBUMIN 2.2* 2.3* 2.5*   No results for input(s): "LIPASE", "AMYLASE" in the last 168 hours. No results for input(s): "AMMONIA" in the last 168 hours. Diabetic: No results for input(s): "HGBA1C" in the last 72 hours. Recent Labs  Lab 02/01/23 2057 02/01/23 2357 02/02/23 0404 02/02/23 0742 02/02/23 1202  GLUCAP 101* 164* 224* 83 136*   Cardiac Enzymes: Recent Labs  Lab 01/27/23 0143  CKTOTAL 60   Recent Labs    06/18/22 1331 10/05/22 0848  PROBNP 1,795* 3,434*   Coagulation Profile: No results for input(s): "INR", "PROTIME" in the last 168 hours. Thyroid Function Tests: No results for input(s): "TSH", "T4TOTAL", "FREET4", "T3FREE", "THYROIDAB" in the last 72 hours. Lipid Profile: No results for input(s): "CHOL", "HDL", "LDLCALC", "TRIG", "CHOLHDL", "LDLDIRECT" in the last 72 hours. Anemia Panel: No results for input(s): "VITAMINB12", "FOLATE", "FERRITIN", "TIBC", "IRON", "RETICCTPCT" in the last 72 hours.  Urine analysis:    Component Value Date/Time   COLORURINE YELLOW 01/13/2023 [place] 01/13/2023 1334   LABSPEC 1.020 01/13/2023 1334   PHURINE 6.0 01/13/2023 1334   GLUCOSEU >=500 (A) 01/13/2023 1334   HGBUR SMALL (A)  01/13/2023 1334   BILIRUBINUR NEGATIVE 01/13/2023 1334   KETONESUR NEGATIVE 01/13/2023 1334   PROTEINUR 30 (A) 01/13/2023 1334   NITRITE NEGATIVE 01/13/2023 1334   LEUKOCYTESUR NEGATIVE 01/13/2023 1334   Sepsis Labs: Invalid input(s): "PROCALCITONIN", "LACTICIDVEN"  Microbiology: No results found for this or any previous visit (from the past 240 hour(s)).   Radiology Studies: No results found.    Dillon Mcreynolds T. [place]  If 7PM-7AM, please contact night-coverage [email or web address] 02/02/2023, 2:01 PM

## 2023-02-02 NOTE — TOC Progression Note (Signed)
Transition of Care [facility]) - Progression Note    Patient Details  Name: Curtis Ramos MRN: [id number] Date of Birth: [birth year]  Transition [facility]) CM/SW [place], [place] Phone Number: 02/02/2023, 4:36 PM  Clinical Narrative:     CSW following to fax patient out for SNF closer to patient being medically ready. Patient currently has cortrak.CSW will continue to follow and assist with patients dc planning needs.   Expected Discharge Plan: [place] Barriers to Discharge: Continued Medical Work up  Expected Discharge Plan and Services In-house Referral: Clinical Social Work Discharge Planning Services: CM Consult Post Acute Care Choice: Long Term Acute Care (LTAC) Living arrangements for the past 2 months: Single Family Home                                       Social Determinants of Health (SDOH) Interventions SDOH Screenings   Food Insecurity: No Food Insecurity (01/18/2023)  Housing: Low Risk  (01/18/2023)  Transportation Needs: No Transportation Needs (01/18/2023)  Utilities: Not At Risk (01/18/2023)  Financial Resource Strain: Low Risk  (01/18/2023)  Tobacco Use: Low Risk  (01/13/2023)    Readmission Risk Interventions     No data to display

## 2023-02-02 NOTE — Progress Notes (Signed)
Physical Therapy Treatment Patient Details Name: BENAJAMIN COPLAND MRN: [id number] DOB: 1932/02/25 Today's Date: 02/02/2023   History of Present Illness Pt is 89 year old presented to [facility] on  01/13/23 after unwitnessed fall at work. In ED pt had PEA arrest with CPR x 9 minutes and intubated. Pt with respiratory failure and septic shock due to pulmonary edema, atelectasis post CPR rib/sternal fxs, PNA, and acute on chronic heart failure. Failed extubation 3/3 and reintubated until extubated 3/6. PMH - HTN, heart failure, afib, thyroid CA    PT Comments    Pt with poor tolerance to treatment today. Co treat with OT. Pt reported being very fatigued today and was +2 Max for all mobility. Pt was able to stand for a prolonged period however required several cues for upright posture as demonstrating a heavy posterior lean. Further mobility was deferred for safety and patient fatigue. No change in DC/DME recs at this time. PT will continue to follow.  Recommendations for follow up therapy are one component of a multi-disciplinary discharge planning process, led by the attending physician.  Recommendations may be updated based on patient status, additional functional criteria and insurance authorization.  Follow Up Recommendations        Assistance Recommended at Discharge    Patient can return home with the following     Equipment Recommendations       Recommendations for Other Services       Precautions / Restrictions Precautions Precautions: Fall Precaution Comments: cortrak Restrictions Weight Bearing Restrictions: No Other Position/Activity Restrictions: heavy posterior lean     Mobility  Bed Mobility Overal bed mobility: Needs Assistance Bed Mobility: Rolling, Supine to Sit Rolling: Mod assist Sidelying to sit: Mod assist       General bed mobility comments: Assist to bring shoulders over, elevate trunk into sitting and bring hips to EOB.    Transfers Overall transfer level:  Needs assistance Equipment used: Rolling walker (2 wheels) Transfers: Sit to/from Stand Sit to Stand: +2 physical assistance, Mod assist   Step pivot transfers: Max assist, +2 physical assistance       General transfer comment: Cues for hand placement on RW. Cues for upright posture when standing. Pt requires increased time for processing 1 step commands.    Ambulation/Gait                   Stairs             Wheelchair Mobility    Modified Rankin (Stroke Patients Only)       Balance Overall balance assessment: Needs assistance Sitting-balance support: Feet supported, No upper extremity supported, Bilateral upper extremity supported Sitting balance-Leahy Scale: Fair Sitting balance - Comments: UE support and intermittent cues to correct posterior lean Postural control: Posterior lean Standing balance support: Bilateral upper extremity supported, During functional activity Standing balance-Leahy Scale: Poor Standing balance comment: walker and mod assist to static stand.                            Cognition Arousal/Alertness: Awake/alert Behavior During Therapy: WFL for tasks assessed/performed Overall Cognitive Status: Impaired/Different from baseline Area of Impairment: Following commands, Safety/judgement, Awareness, Problem solving, Memory                 Orientation Level: Disoriented to, Time, Situation   Memory: Decreased short-term memory Following Commands: Follows one step commands with increased time, Follows one step commands consistently Safety/Judgement: Decreased awareness of  safety, Decreased awareness of deficits Awareness: Emergent Problem Solving: Decreased initiation, Requires verbal cues, Difficulty sequencing General Comments: Pt c/o fatigue today during session. Pt with heavy posterior lean with head down and sticking buttocks out behind him while pushing walker forward.  Difficult to do tasks in standing and pt  unable to problem solve how to stand straight and unable to follow commands to motor plan how to do this.        Exercises      General Comments General comments (skin integrity, edema, etc.): VSS on RA      Pertinent Vitals/Pain Pain Assessment Pain Assessment: No/denies pain    Home Living                          Prior Function            PT Goals (current goals can now be found in the care plan section) Progress towards PT goals: Progressing toward goals    Frequency           PT Plan      Co-evaluation   Reason for Co-Treatment: Complexity of the patient's impairments (multi-system involvement) PT goals addressed during session: Mobility/safety with mobility;Proper use of DME OT goals addressed during session: ADL's and self-care      AM-PAC PT "6 Clicks" Mobility   Outcome Measure                   End of Session Equipment Utilized During Treatment: Gait belt Activity Tolerance: Patient tolerated treatment well Patient left: in chair;with call bell/phone within reach;with chair alarm set;Other (comment) Nurse Communication: Mobility status       Time: [id number] PT Time Calculation (min) (ACUTE ONLY): 37 min  Charges:  $Therapeutic Activity: 8-22 mins                     Shelby Mattocks, PT, DPT Acute Rehab Services [id number]    Viann Shove 02/02/2023, 11:20 AM

## 2023-02-02 NOTE — Progress Notes (Signed)
Occupational Therapy Treatment Patient Details Name: Curtis Ramos MRN: [id number] DOB: 11/27/1931 Today's Date: 02/02/2023   History of present illness Pt is 89 year old presented to [facility] on  01/13/23 after unwitnessed fall at work. In ED pt had PEA arrest with CPR x 9 minutes and intubated. Pt with respiratory failure and septic shock due to pulmonary edema, atelectasis post CPR rib/sternal fxs, PNA, and acute on chronic heart failure. Failed extubation 3/3 and reintubated until extubated 3/6. PMH - HTN, heart failure, afib, thyroid CA   OT comments  Pt making slow improvements. Pt very fatigued this am and unable to ambulate with walker due to heaving posterior lean and reporting too tired to walk. Pt instructed in standing tall and pulling hips underneath him and getting head up when using walker.  Pt continues to be limited by cognition in areas of motor planning, memory and problem solving.  Pt overall at max assist level with adls. Addressed feeding tasks this am and pt had better success sitting in chair for meals or fully upright in bed.   Recommendations for follow up therapy are one component of a multi-disciplinary discharge planning process, led by the attending physician.  Recommendations may be updated based on patient status, additional functional criteria and insurance authorization.    Follow Up Recommendations  Skilled nursing-short term rehab (<3 hours/day)     Assistance Recommended at Discharge Frequent or constant Supervision/Assistance  Patient can return home with the following  A lot of help with walking and/or transfers;A lot of help with bathing/dressing/bathroom;Assistance with cooking/housework;Direct supervision/assist for medications management;Assistance with feeding;Direct supervision/assist for financial management;Assist for transportation;Help with stairs or ramp for entrance   Equipment Recommendations       Recommendations for Other Services       Precautions / Restrictions Precautions Precautions: Fall Precaution Comments: cortrak Restrictions Weight Bearing Restrictions: No Other Position/Activity Restrictions: heavy posterior lean       Mobility Bed Mobility Overal bed mobility: Needs Assistance Bed Mobility: Rolling, Supine to Sit Rolling: Mod assist Sidelying to sit: Mod assist       General bed mobility comments: Assist to bring shoulders over, elevate trunk into sitting and bring hips to EOB.    Transfers Overall transfer level: Needs assistance Equipment used: Rolling walker (2 wheels) Transfers: Sit to/from Stand Sit to Stand: +2 physical assistance, Mod assist     Step pivot transfers: Max assist, +2 physical assistance     General transfer comment: Cues for hand placement on RW. Cues for upright posture when standing. Pt requires increased time for processing 1 step commands.     Balance Overall balance assessment: Needs assistance Sitting-balance support: Feet supported, No upper extremity supported, Bilateral upper extremity supported Sitting balance-Leahy Scale: Fair Sitting balance - Comments: UE support and intermittent cues to correct posterior lean Postural control: Posterior lean Standing balance support: Bilateral upper extremity supported, During functional activity Standing balance-Leahy Scale: Poor Standing balance comment: walker and mod assist to static stand.                           ADL either performed or assessed with clinical judgement   ADL Overall ADL's : Needs assistance/impaired Eating/Feeding: Minimal assistance;Sitting Eating/Feeding Details (indicate cue type and reason): Pt c/o having trouble feeding self on arrival. Pt with increased independencen drinking through straw.  When pt positioned better in bed with cotrack out of way, pt could feed self better. Recommend pt be  in chair for meals.                     Toilet Transfer: Maximal assistance;+2  for physical assistance;Stand-pivot;BSC/3in1 Toilet Transfer Details (indicate cue type and reason): Pt struggling with motor planning tasks this am in area of standing up tall. Pt wanting to push bottom backwards and feet out in front which required +2 to facilitate full standing to transfer. Toileting- Clothing Manipulation and Hygiene: Maximal assistance;+2 for physical assistance;Sit to/from stand Toileting - Clothing Manipulation Details (indicate cue type and reason): Pt stood with pt at [facility] while theapist cleaned pt.     Functional mobility during ADLs: Maximal assistance;+2 for physical assistance;Rolling walker (2 wheels) General ADL Comments: Pt limited by fatigue, impaired motor planning and problem solving and poor balance/endurance.    Extremity/Trunk Assessment Upper Extremity Assessment Upper Extremity Assessment: Overall WFL for tasks assessed   Lower Extremity Assessment Lower Extremity Assessment: Defer to PT evaluation        Vision       Perception     Praxis      Cognition Arousal/Alertness: Awake/alert Behavior During Therapy: WFL for tasks assessed/performed Overall Cognitive Status: Impaired/Different from baseline Area of Impairment: Following commands, Safety/judgement, Awareness, Problem solving, Memory                 Orientation Level: Disoriented to, Time, Situation   Memory: Decreased short-term memory Following Commands: Follows one step commands with increased time, Follows one step commands consistently Safety/Judgement: Decreased awareness of safety, Decreased awareness of deficits Awareness: Emergent Problem Solving: Decreased initiation, Requires verbal cues, Difficulty sequencing General Comments: Pt c/o fatigue today during session. Pt with heavy posterior lean with head down and sticking buttocks out behind him while pushing walker forward.  Difficult to do tasks in standing and pt unable to problem solve how to stand straight and  unable to follow commands to motor plan how to do this.        Exercises      Shoulder Instructions       General Comments Pt stated he was very fatigued today. Pt unable to tolerate steps today.    Pertinent Vitals/ Pain       Pain Assessment Pain Assessment: No/denies pain  Home Living                                          Prior Functioning/Environment              Frequency  Min 2X/week        Progress Toward Goals  OT Goals(current goals can now be found in the care plan section)  Progress towards OT goals: Progressing toward goals  Acute Rehab OT Goals Patient Stated Goal: to get stronger OT Goal Formulation: With patient Time For Goal Achievement: 02/07/23 Potential to Achieve Goals: Good ADL Goals Pt Will Perform Grooming: sitting;with min assist Pt Will Perform Upper Body Dressing: sitting;with min assist Pt Will Perform Lower Body Dressing: sit to/from stand;with mod assist Pt Will Transfer to Toilet: stand pivot transfer;bedside commode;with mod assist  Plan Discharge plan remains appropriate    Co-evaluation    PT/OT/SLP Co-Evaluation/Treatment: Yes Reason for Co-Treatment: Complexity of the patient's impairments (multi-system involvement) PT goals addressed during session: Mobility/safety with mobility;Proper use of DME OT goals addressed during session: ADL's and self-care      AM-PAC  OT "6 Clicks" Daily Activity     Outcome Measure   Help from another person eating meals?: A Little Help from another person taking care of personal grooming?: A Little Help from another person toileting, which includes using toliet, bedpan, or urinal?: A Lot Help from another person bathing (including washing, rinsing, drying)?: A Lot Help from another person to put on and taking off regular upper body clothing?: A Little Help from another person to put on and taking off regular lower body clothing?: A Lot 6 Click Score: 15    End  of Session Equipment Utilized During Treatment: Gait belt;Rolling walker (2 wheels)  OT Visit Diagnosis: Unsteadiness on feet (R26.81);Other abnormalities of gait and mobility (R26.89);Muscle weakness (generalized) (M62.81);History of falling (Z91.81)   Activity Tolerance Patient limited by fatigue   Patient Left in chair;with call bell/phone within reach;with chair alarm set   Nurse Communication Mobility status        Time: [id number] OT Time Calculation (min): 38 min  Charges: OT General Charges $OT Visit: 1 Visit OT Treatments $Self Care/Home Management : 8-22 mins    Glenford Peers 02/02/2023, 9:54 AM

## 2023-02-02 NOTE — Plan of Care (Signed)
  Problem: Coping: Goal: Ability to adjust to condition or change in health will improve Outcome: Progressing   Problem: Fluid Volume: Goal: Ability to maintain a balanced intake and output will improve Outcome: Progressing   Problem: Health Behavior/Discharge Planning: Goal: Ability to identify and utilize available resources and services will improve Outcome: Progressing Goal: Ability to manage health-related needs will improve Outcome: Progressing   

## 2023-02-02 NOTE — Progress Notes (Addendum)
Advanced Heart Failure Rounding Note  PCP-Cardiologist: None   Subjective:   Admitted 2/29 PEA arrest .  Now off pressors.   Denies pain. Asking if we know how to pray.    Objective:   Weight Range: 59.8 kg Body mass index is 18.39 kg/m.   Vital Signs:   Temp:  [97.5 F (36.4 C)-97.9 F (36.6 C)] 97.7 F (36.5 C) (03/20 0730) Pulse Rate:  [64-75] 71 (03/20 0730) Resp:  [18-20] 18 (03/20 0730) BP: (110-131)/(60-74) 113/66 (03/20 0730) SpO2:  [98 %-100 %] 98 % (03/20 0400) Weight:  [59.8 kg] 59.8 kg (03/20 0400) Last BM Date : 02/02/23  Weight change: Filed Weights   01/31/23 0334 02/01/23 0417 02/02/23 0400  Weight: 61.4 kg 57.3 kg 59.8 kg    Intake/Output:   Intake/Output Summary (Last 24 hours) at 02/02/2023 1226 Last data filed at 02/02/2023 0928 Gross per 24 hour  Intake 120 ml  Output 1100 ml  Net -980 ml      Physical Exam    General:  Thin No resp difficulty HEENT: + Cortrak Neck: Supple. JVP 5-6  . Carotids 2+ bilat; no bruits. No lymphadenopathy or thyromegaly appreciated. Cor: PMI nondisplaced. Regular rate & rhythm. No rubs, gallops or murmurs. Lungs: Clear Abdomen: Soft, nontender, nondistended. No hepatosplenomegaly. No bruits or masses. Good bowel sounds. Extremities: No cyanosis, clubbing, rash, edema Neuro: Alert & orientedx2, cranial nerves grossly intact. moves all 4 extremities w/o difficulty. Affect pleasant   Telemetry  No longer on the monitor   EKG    N/A  Labs    CBC No results for input(s): "WBC", "NEUTROABS", "HGB", "HCT", "MCV", "PLT" in the last 72 hours. Basic Metabolic Panel No results for input(s): "NA", "K", "CL", "CO2", "GLUCOSE", "BUN", "CREATININE", "CALCIUM", "MG", "PHOS" in the last 72 hours. Liver Function Tests No results for input(s): "AST", "ALT", "ALKPHOS", "BILITOT", "PROT", "ALBUMIN" in the last 72 hours. No results for input(s): "LIPASE", "AMYLASE" in the last 72 hours. Cardiac Enzymes No results  for input(s): "CKTOTAL", "CKMB", "CKMBINDEX", "TROPONINI" in the last 72 hours.  BNP: BNP (last 3 results) Recent Labs    12/07/22 1307 12/31/22 1156 01/13/23 1344  BNP >4,500.0* 1,669.4* 1,073.1*    ProBNP (last 3 results) Recent Labs    06/18/22 1331 10/05/22 0848  PROBNP 1,795* 3,434*     D-Dimer No results for input(s): "DDIMER" in the last 72 hours. Hemoglobin A1C No results for input(s): "HGBA1C" in the last 72 hours. Fasting Lipid Panel No results for input(s): "CHOL", "HDL", "LDLCALC", "TRIG", "CHOLHDL", "LDLDIRECT" in the last 72 hours. Thyroid Function Tests No results for input(s): "TSH", "T4TOTAL", "T3FREE", "THYROIDAB" in the last 72 hours.  Invalid input(s): "FREET3"  Other results:   Imaging    No results found.   Medications:     Scheduled Medications:  amiodarone  200 mg Oral Daily   enoxaparin (LOVENOX) injection  40 mg Subcutaneous Q24H   feeding supplement  237 mL Oral TID BM   feeding supplement (OSMOLITE 1.5 CAL)  650 mL Per Tube Q24H   levothyroxine  175 mcg Oral Q0600   lidocaine  1 patch Transdermal Q24H   mirtazapine  7.5 mg Oral QHS   mouth rinse  15 mL Mouth Rinse 4 times per day   pantoprazole (PROTONIX) IV  40 mg Intravenous Q12H   spironolactone  12.5 mg Oral Daily    Infusions:  sodium chloride Stopped (01/25/23 1632)    PRN Medications: docusate, HYDROmorphone (DILAUDID) injection, lip  balm, ondansetron (ZOFRAN) IV, mouth rinse, polyethylene glycol, senna-docusate    Assessment/Plan  1. Cardiac Arrest--> Shock --> cardiogenic shock - 2/29 PEA arrest--> 9 min CPR 4 rounds epi. Unshockable rhythm.  - Rib fracture 2-7 ribs - Using lidocaine patch.  - Off pressors. Central line out.    2. A/C HFrEF, NICM -->Cardiogenic shock - Echo 5/20 EF 20-25% - Echo 12/21 EF 55-60% - Echo  11/23   EF 20-25% LV markedly dilated + dyssynchrony - RV ok  Moderate MR/TR severe biatrial enlargement  - Cath 2012: No CAD (cath not  repeated as low suspicion for iCM and CKD 3b) - cMRI 2/24: LBBB, LV EF 17%. RV EF 33%. Severe LAEt. No LGE.  - Zio 2/24  AFL 100% avg rat 67 PVC 1.8% - Initially on pressors. Resolved.  - Add 12.5 mg losartan   - Add 12.5 mg spironolactone daily. - Hold off on jardiance.  - Renal function stable.    3. Acute Hypoxic Respiratory Failure in setting of cardiac arrest and aspiration PNA/sternal fractures - Extubated 3/3 but decompensated and required reintubation. - Continue abx (unasyn has high salt load watch fluid status) - Extubated 01/19/23. Resolved on room air.    4. Chronic AF and frequent PVCs - has been on amio as outpatient.  - Continue amio 200 mg daily to suppress PVCs and rate control.  - off AC with fractures and intermittent GI bleed.  - Off the monitor.   5. LBBB   6. Rib/sternal fractures + scalp hematoma - due to CPR.  -CT - R scalp hematoma. No acute intracranial abnormality.     7. AKI due to ATN/shock - Creatinine baseline~ 1.6 - Resolved.    8. Hypothyroidism  - H/O Thyroid cancer. TSH 29 T4 1.26 - Suspect in the setting of acute event.  - On levothyroxine.    9. Shock liver - due to arrest -> resolved.    10. Hypernatremia - Resolved.    11. Bradycardia -He has not been on nodal blockers.  - Resolved. Back on amio     12. Anemia  -GI consulted for hematochezia. No plan for scope.  - Off heparin drip. Most recent hemoglobin 10.4.  - On PPI.    13. [place] following. Full Code.  - Planning for SNF.    HF Team will sign off.    Length of Stay: [place], NP  02/02/2023, 12:26 PM  Advanced Heart Failure Team Pager [phone] (M-F; 7a - 5p)  Please contact [facility] for night-coverage after hours (5p -7a ) and weekends on [email or web address]  Patient seen and examined with the above-signed Advanced Practice Provider and/or Housestaff. I personally reviewed laboratory data, imaging studies and relevant notes. I independently  examined the patient and formulated the important aspects of the plan. I have edited the note to reflect any of my changes or salient points. I have personally discussed the plan with the patient and/or family.  Says chest feels much better. Denies SOB. Mildly confused at times  General:  Elderly. Thin Weak appearing. No resp difficulty HEENT: normal + cor-trak Neck: supple. no JVD. Carotids 2+ bilat; no bruits. No lymphadenopathy or thryomegaly appreciated. Cor: PMI nondisplaced. Regular rate & rhythm. No rubs, gallops or murmurs. Lungs: clear Abdomen: soft, nontender, nondistended. No hepatosplenomegaly. No bruits or masses. Good bowel sounds. Extremities: no cyanosis, clubbing, rash, edema Neuro: alert follows commands. A bit confused   He is stable. Agree with plan for SNF.  Will add losartan 12.5.   HF team will arrange outpatient f/u and sign off.   Glori Bickers, MD  2:07 PM

## 2023-02-03 ENCOUNTER — Inpatient Hospital Stay (HOSPITAL_COMMUNITY): Payer: Medicare Other

## 2023-02-03 DIAGNOSIS — J95851 Ventilator associated pneumonia: Secondary | ICD-10-CM | POA: Diagnosis not present

## 2023-02-03 DIAGNOSIS — A419 Sepsis, unspecified organism: Secondary | ICD-10-CM | POA: Diagnosis not present

## 2023-02-03 DIAGNOSIS — I469 Cardiac arrest, cause unspecified: Secondary | ICD-10-CM | POA: Diagnosis not present

## 2023-02-03 DIAGNOSIS — E43 Unspecified severe protein-calorie malnutrition: Secondary | ICD-10-CM | POA: Diagnosis not present

## 2023-02-03 LAB — CBC
HCT: 32.7 % — ABNORMAL LOW (ref 39.0–52.0)
Hemoglobin: 10.9 g/dL — ABNORMAL LOW (ref 13.0–17.0)
MCH: 36.9 pg — ABNORMAL HIGH (ref 26.0–34.0)
MCHC: 33.3 g/dL (ref 30.0–36.0)
MCV: 110.8 fL — ABNORMAL HIGH (ref 80.0–100.0)
Platelets: 419 10*3/uL — ABNORMAL HIGH (ref 150–400)
RBC: 2.95 MIL/uL — ABNORMAL LOW (ref 4.22–5.81)
RDW: 15.7 % — ABNORMAL HIGH (ref 11.5–15.5)
WBC: 13 10*3/uL — ABNORMAL HIGH (ref 4.0–10.5)
nRBC: 0 % (ref 0.0–0.2)

## 2023-02-03 LAB — RENAL FUNCTION PANEL
Albumin: 2.7 g/dL — ABNORMAL LOW (ref 3.5–5.0)
Anion gap: 7 (ref 5–15)
BUN: 33 mg/dL — ABNORMAL HIGH (ref 8–23)
CO2: 24 mmol/L (ref 22–32)
Calcium: 10.3 mg/dL (ref 8.9–10.3)
Chloride: 107 mmol/L (ref 98–111)
Creatinine, Ser: 1.1 mg/dL (ref 0.61–1.24)
GFR, Estimated: >60 mL/min (ref >60)
Glucose, Bld: 98 mg/dL (ref 70–99)
Phosphorus: 2.9 mg/dL (ref 2.5–4.6)
Potassium: 4.2 mmol/L (ref 3.5–5.1)
Sodium: 138 mmol/L (ref 135–145)

## 2023-02-03 LAB — MAGNESIUM: Magnesium: 2.2 mg/dL (ref 1.7–2.4)

## 2023-02-03 LAB — GLUCOSE, CAPILLARY
Glucose-Capillary: 105 mg/dL — ABNORMAL HIGH (ref 70–99)
Glucose-Capillary: 84 mg/dL (ref 70–99)
Glucose-Capillary: 95 mg/dL (ref 70–99)
Glucose-Capillary: 96 mg/dL (ref 70–99)
Glucose-Capillary: 97 mg/dL (ref 70–99)
Glucose-Capillary: 98 mg/dL (ref 70–99)

## 2023-02-03 MED ORDER — ACETAMINOPHEN 325 MG PO TABS: 650.0000 mg | ORAL_TABLET | Freq: Four times a day (QID) | ORAL | Status: DC | PRN

## 2023-02-03 MED ORDER — UMECLIDINIUM-VILANTEROL 62.5-25 MCG/ACT IN AEPB: 1.0000 | INHALATION_SPRAY | Freq: Every day | RESPIRATORY_TRACT | Status: DC

## 2023-02-03 NOTE — TOC Progression Note (Signed)
Transition of Care [facility]) - Progression Note    Patient Details  Name: Curtis Ramos MRN: [id number] Date of Birth: 07/09/1932  Transition of Care [facility]) CM/SW [place], [place] Phone Number: 02/03/2023, 4:47 PM  Clinical Narrative:     CSW following for SNF placement. Patient currently has cortrak. Will fax out closer to patient being medically ready. CSW will continue to follow and assist with patients dc planning needs.  Expected Discharge Plan: [place] Barriers to Discharge: Continued Medical Work up  Expected Discharge Plan and Services In-house Referral: Clinical Social Work Discharge Planning Services: CM Consult Post Acute Care Choice: Long Term Acute Care (LTAC) Living arrangements for the past 2 months: Single Family Home                                       Social Determinants of Health (SDOH) Interventions SDOH Screenings   Food Insecurity: No Food Insecurity (01/18/2023)  Housing: Low Risk  (01/18/2023)  Transportation Needs: No Transportation Needs (01/18/2023)  Utilities: Not At Risk (01/18/2023)  Financial Resource Strain: Low Risk  (01/18/2023)  Tobacco Use: Low Risk  (01/13/2023)    Readmission Risk Interventions     No data to display

## 2023-02-03 NOTE — Progress Notes (Signed)
RN attempted to feed Curtis Ramos breakfast but he raises his hand up and states that he doesn't want to eat and would like to rest and wait for dinner. He ate 3 bites of pudding. Nectar thick water given per requests. He denied any pain or distress at this time. WIll cont to monitor.

## 2023-02-03 NOTE — Progress Notes (Signed)
RN offered to feed patient lunch, but pt's son verbalizes that he will feed [name]. RN explained calories count order.  Will continue to monitor.

## 2023-02-03 NOTE — Progress Notes (Signed)
Palliative Medicine Progress Note   Patient Name: Curtis Ramos       Date: 02/03/2023 DOB: 1932-08-13  Age: 89 y.o. MRN#: [id number] Attending Physician: Curtis Riding, MD Primary Care Physician: Curtis Sake, MD Admit Date: 01/13/2023    HPI/Patient Profile: Palliative Care consult requested for goals of care discussion in this 89 y.o. male  with past medical history of atrial fibrillation (Eliquis), GERD, hypothyroidism, thyroid cancer, hypertension, systolic heart failure with severe LV dilation, EF 17%, LBBB as managed by Dr. Haroldine Ramos, [place].  He was admitted on 01/13/2023 after being found down at work where he fell between the radiator and the wall.  At that time he was unresponsive.  Patient suffered PEA arrest undergoing CPR for 9 minutes before ROSC while in the ED.  Extubated on 3/3 but decompensated and required reintubation. Extubated again on 3/6. He was weaned off oxygen on 3/12. Hospital course has been complicated by delirium and dysphagia.   Subjective: Chart reviewed. Patient had MBS study today and was cleared for a dysphagia 1 diet with nectar thick liquids.   Bedside visit. Patient is sitting up in bed, feeding himself dinner. Son/Curtis Ramos is present. He expresses relief that his father has been cleared for a diet and they are hopefully going to avoid a long-term feeding tube. Discussed disposition - Curtis Ramos shares that [name] was declined for CIR due to family is unable to provide 24/7 supervision at home after discharge. Plan is for  SNF/rehab instead.   Outpatient palliative care services were explained and offered. Discussed that outpatient palliative would provide ongoing holistic support in the setting of serious illness and could help family navigate any decisions that may  arise pending patient's clinical course.     Objective:  Physical Exam Vitals reviewed.  Constitutional:      General: He is not in acute distress.    Appearance: He is ill-appearing.  HENT:     Head:     Comments: Cortrak in place Pulmonary:     Effort: Pulmonary effort is normal.  Neurological:     Mental Status: He is alert.     Motor: Weakness present.     Comments: oriented              Palliative Medicine Assessment & Plan   Assessment: Principal Problem:  Cardiac arrest [facility]) Active Problems:   AKI (acute kidney injury) ([place])   Chronic systolic CHF (congestive heart failure) (HCC)   Hypothyroidism   Septic shock (HCC)   Acute respiratory failure with hypoxia (HCC)   Protein-calorie malnutrition, severe   Hematochezia   VAP (ventilator-associated pneumonia) (HCC)   Delirium   Oropharyngeal dysphagia   Elevated liver enzymes   Acute urinary retention    Recommendations/Plan: Full code/full scope Allow time for outcomes PMT will continue to follow\ Outpatient palliative referral at discharge   Prognosis:  Unable to determine  Discharge Planning: SNF/rehab    Thank you for allowing the Palliative Medicine Team to assist in the care of this patient.   MDM - moderate   Lavena Bullion, NP   Please contact Palliative Medicine Team phone at [phone] for questions and concerns.  For individual providers, please see AMION.

## 2023-02-03 NOTE — Progress Notes (Signed)
PROGRESS NOTE  Curtis Ramos [id number] DOB: Jan 20, 1932   PCP: Leonides Sake, MD  Patient is from: Home.  DOA: 01/13/2023 LOS: 21  Chief complaints No chief complaint on file.    Brief Narrative / Interim history: 89 year old M with PMH of systolic CHF, NICM, A-fib on Eliquis, HTN, hypothyroidism, thyroid cancer and GERD presented to ED on 2/29 after unwitnessed fall at work.  As per the PCCM notes patient was found down between the radiator and the wall unresponsive with contusion.  On arrival to ED patient became apneic and lost pulses and cardiac monitoring showed PEA arrest.  CPR administered for 9 minutes with epi x 4 and ROSC achieved.  Patient was intubated and admitted to ICU. cardiology was consulted. CT of the chest abdomen and pelvis showed known displaced transverse fractures of the mid lower sternal body, acute fracture of the coastal cartilage of bilateral second through sixth ribs, acute fracture of the anterior second to 7 ribs.  He was started on Unasyn for aspiration pneumonitis, later transitioned to cefepime for Pseudomonas VAP.    Patient was extubated on 01/19/2023, and transferred to [facility] on 01/21/2023. On 3/9, he became tachypneic and requiried BIPAP for 24 hours, and transitioned to HF oxygen.  Patient was weaned off oxygen to room air on 3/12.  Remains on TF via cortrack due to significant risk for aspiration.  Hospital course significant for delirium that seems to have resolved.  After discussion with family, IR was consulted for G-tube.  However, SLP reevaluated patient and advanced diet to dysphagia-1 and family wanted to hold off G-tube.  SLP advancing him further to dysphagia 2 diet.  Unfortunately, patient's p.o. intake is very poor.  After discussion with RD and patient's son, IR reconsulted for G-tube.   Final disposition SNF.    Subjective: Seen and examined earlier this morning.  Patient was not able to get his tube feed overnight due to clogged Cortak.   Per RN, he refused to eat his breakfast.  Otherwise, no complaints.  Patient's son, [name] at bedside.  Objective: Vitals:   02/03/23 0017 02/03/23 0401 02/03/23 0734 02/03/23 1402  BP: 116/64 130/69 122/62 (!) 105/54  Pulse: 77 81 74 72  Resp: 18 18 16 18   Temp: 97.8 F (36.6 C) 98 F (36.7 C) 98 F (36.7 C) 98 F (36.7 C)  TempSrc: Oral Oral Oral Oral  SpO2: 100% 100% 100% 95%  Weight:  60.9 kg    Height:        Examination:  GENERAL: No apparent distress.  Nontoxic. HEENT: MMM.  Vision and hearing grossly intact.  Feeding tube in nose. NECK: Supple.  No apparent JVD.  RESP:  No IWOB.  Fair aeration bilaterally. CVS:  RRR. Heart sounds normal.  ABD/GI/GU: BS+. Abd soft, NTND.  MSK/EXT:   No apparent deformity. Moves extremities. No edema.  SKIN: no apparent skin lesion or wound NEURO: Awake and alert. Oriented appropriately.  No apparent focal neuro deficit. PSYCH: Calm. Normal affect.   Procedures:  Intubation and mechanical ventilation  Microbiology summarized: 3/3-respiratory culture with Pseudomonas aeruginosa. 3/9-blood cultures NGTD  Assessment and plan: Principal Problem:   Cardiac arrest ([place]) Active Problems:   AKI (acute kidney injury) ([place])   Chronic systolic CHF (congestive heart failure) (HCC)   Hypothyroidism   Septic shock (HCC)   Acute respiratory failure with hypoxia (HCC)   Protein-calorie malnutrition, severe   Hematochezia   VAP (ventilator-associated pneumonia) (HCC)   Delirium   Oropharyngeal dysphagia  Elevated liver enzymes   Acute urinary retention  Dysphagia: -Advance to dysphagia 2 diet by SLP but p.o. intake remains poor. -IR reconsulted for G-tube after discussion with RD and patient's sons, Cyndi Bender  PEA arrest/ Cardiogenic shock: -2/29 PEA arrest--> 9 min CPR 4 rounds epi. Unshockable rhythm.  -Rib fracture 2-7 ribs - Using lidocaine patch.  -Off pressors. Central line out. Weaned off vasopressors. -Continue  amiodarone to suppress PVCs.   NICM/Chronic systolic heart failure: TTE with LVEF of 20%.  Euvolemic.  On RA. -Cardiology following intermittently -On low-dose Aldactone.  Added low-dose losartan on 3/20.  Acute respiratory failure with hypoxia: Multifactorial-PEA arrest, sternal and rib fractures and aspiration pneumonia. -Extubated on 3/6.  Currently on room air. -Pulmonary toilet and aspiration precaution  Septic shock due to Pseudomonas VAP/COVID : Resolved.  Blood cultures NGTD. -Completing course of IV cefepime on 3/13   Chronic atrial fibrillation with frequent PVC's -On amiodarone 200 mg daily.  -Not on anticoagulation due to hematochezia.   Hematochezia: Reportedly had intermittent hematochezia, and anticoagulation discontinued.  Not a candidate for intervention or procedure per GI.  H&H stable now.  Anemia panel suggests anemia of chronic disease. Recent Labs    01/23/23 0120 01/24/23 0213 01/25/23 0130 01/25/23 1325 01/25/23 1347 01/26/23 0147 01/27/23 0143 01/28/23 0246 01/30/23 0212 02/03/23 0536  HGB 10.4* 9.2* 9.4* 9.7* 9.5* 10.4* 9.3* 9.6* 10.4* 10.9*  -Monitor intermittently.   Hypothyroidism/history of thyroid cancer:  -Continue home Synthroid -Recheck TSH outpatient  Hypernatremia/hypophosphatemia/hypocalcemia: Resolved.   AKI due to shock: Resolved.   Urinary retention: Seems to have resolved.  Passed voiding trial.  Elevated liver enzymes: Could be ischemic or congestive.  Delirium: Stable.  Fairly oriented but limited insight. -Delirium precaution   Severe malnutrition/dysphagia/poor p.o. intake Body mass index is 18.73 kg/m. Nutrition Problem: Severe Malnutrition Etiology: chronic illness (CHF) Signs/Symptoms: severe fat depletion, severe muscle depletion Interventions: Refer to RD note for recommendations Plan for G-tube   DVT prophylaxis:  enoxaparin (LOVENOX) injection 40 mg Start: 01/27/23 2200 SCDs Start: 01/13/23 1327  Code  Status: Full code Family Communication: Updated patient's sons, [name] at bedside and [name] over the phone. Level of care: Med-Surg Status is: Inpatient Remains inpatient appropriate because: Dysphagia and decreased oral intake   Final disposition: SNF Consultants:  Cardiology GI Pulmonology Palliative medicine IR  35 minutes with more than 50% spent in reviewing records, counseling patient/family and coordinating care.   Sch Meds:  Scheduled Meds:  amiodarone  200 mg Oral Daily   enoxaparin (LOVENOX) injection  40 mg Subcutaneous Q24H   feeding supplement  237 mL Oral TID BM   feeding supplement (OSMOLITE 1.5 CAL)  650 mL Per Tube Q24H   levothyroxine  175 mcg Oral Q0600   lidocaine  1 patch Transdermal Q24H   losartan  12.5 mg Oral Daily   mirtazapine  7.5 mg Oral QHS   mouth rinse  15 mL Mouth Rinse 4 times per day   pantoprazole (PROTONIX) IV  40 mg Intravenous Q12H   spironolactone  12.5 mg Oral Daily   Continuous Infusions:  sodium chloride Stopped (01/25/23 1632)   PRN Meds:.acetaminophen, docusate, lip balm, ondansetron (ZOFRAN) IV, mouth rinse, polyethylene glycol, senna-docusate  Antimicrobials: Anti-infectives (From admission, onward)    Start     Dose/Rate Route Frequency Ordered Stop   01/17/23 1100  ceFEPIme (MAXIPIME) 2 g in sodium chloride 0.9 % 100 mL IVPB        2 g 200 mL/hr over  30 Minutes Intravenous Every 12 hours 01/17/23 1040 01/26/23 2359   01/16/23 1400  cefTRIAXone (ROCEPHIN) 2 g in sodium chloride 0.9 % 100 mL IVPB  Status:  Discontinued        2 g 200 mL/hr over 30 Minutes Intravenous Every 24 hours 01/16/23 1136 01/17/23 1028   01/15/23 0815  Ampicillin-Sulbactam (UNASYN) 3 g in sodium chloride 0.9 % 100 mL IVPB  Status:  Discontinued        3 g 200 mL/hr over 30 Minutes Intravenous Every 6 hours 01/15/23 0804 01/16/23 1136   01/13/23 1345  Ampicillin-Sulbactam (UNASYN) 3 g in sodium chloride 0.9 % 100 mL IVPB  Status:  Discontinued         3 g 200 mL/hr over 30 Minutes Intravenous Every 12 hours 01/13/23 1339 01/15/23 0804        I have personally reviewed the following labs and images: CBC: Recent Labs  Lab 01/28/23 0246 01/30/23 0212 02/03/23 0536  WBC 10.4 10.5 13.0*  HGB 9.6* 10.4* 10.9*  HCT 29.7* 31.7* 32.7*  MCV 110.8* 110.8* 110.8*  PLT 406* 417* 419*   BMP &GFR Recent Labs  Lab 01/28/23 0246 01/30/23 0212 02/03/23 0536 02/03/23 0537  NA 140 142  --  138  K 3.7 4.2  --  4.2  CL 111 111  --  107  CO2 24 23  --  24  GLUCOSE 110* 108*  --  98  BUN 33* 33*  --  33*  CREATININE 1.03 1.01  --  1.10  CALCIUM 9.8 10.2  --  10.3  MG 2.5* 2.5* 2.2  --   PHOS 2.5 2.9  --  2.9   Estimated Creatinine Clearance: 40 mL/min (by C-G formula based on SCr of 1.1 mg/dL). Liver & Pancreas: Recent Labs  Lab 01/28/23 0246 01/30/23 0212 02/03/23 0537  AST 47* 37  --   ALT 101* 80*  --   ALKPHOS 138* 153*  --   BILITOT 0.6 0.4  --   PROT 6.5 6.7  --   ALBUMIN 2.3* 2.5* 2.7*   No results for input(s): "LIPASE", "AMYLASE" in the last 168 hours. No results for input(s): "AMMONIA" in the last 168 hours. Diabetic: No results for input(s): "HGBA1C" in the last 72 hours. Recent Labs  Lab 02/03/23 0014 02/03/23 0400 02/03/23 0816 02/03/23 1148 02/03/23 1602  GLUCAP 95 84 96 105* 97   Cardiac Enzymes: No results for input(s): "CKTOTAL", "CKMB", "CKMBINDEX", "TROPONINI" in the last 168 hours.  Recent Labs    06/18/22 1331 10/05/22 0848  PROBNP 1,795* 3,434*   Coagulation Profile: No results for input(s): "INR", "PROTIME" in the last 168 hours. Thyroid Function Tests: No results for input(s): "TSH", "T4TOTAL", "FREET4", "T3FREE", "THYROIDAB" in the last 72 hours. Lipid Profile: No results for input(s): "CHOL", "HDL", "LDLCALC", "TRIG", "CHOLHDL", "LDLDIRECT" in the last 72 hours. Anemia Panel: No results for input(s): "VITAMINB12", "FOLATE", "FERRITIN", "TIBC", "IRON", "RETICCTPCT" in the last 72  hours.  Urine analysis:    Component Value Date/Time   COLORURINE YELLOW 01/13/2023 [place] 01/13/2023 1334   LABSPEC 1.020 01/13/2023 1334   PHURINE 6.0 01/13/2023 1334   GLUCOSEU >=500 (A) 01/13/2023 1334   HGBUR SMALL (A) 01/13/2023 1334   BILIRUBINUR NEGATIVE 01/13/2023 1334   KETONESUR NEGATIVE 01/13/2023 1334   PROTEINUR 30 (A) 01/13/2023 1334   NITRITE NEGATIVE 01/13/2023 1334   LEUKOCYTESUR NEGATIVE 01/13/2023 1334   Sepsis Labs: Invalid input(s): "PROCALCITONIN", "LACTICIDVEN"  Microbiology: No results  found for this or any previous visit (from the past 240 hour(s)).   Radiology Studies: No results found.    Itzae Mccurdy T. [place]  If 7PM-7AM, please contact night-coverage [email or web address] 02/03/2023, 5:15 PM

## 2023-02-03 NOTE — Progress Notes (Signed)
Nutrition Quick Note:   Pt now with clogged Cortrak tube and has not been receiving tube feeds. Spoke with RN who reports that the pt did not want any of his breakfast this am. RD will start calorie count and run through tomorrow. RD suspects that the pt is not eating enough to sustain his weight or even gain weight, which would be optimal. RD will continue to monitor and collect calorie count results on 3/22.   Thalia Bloodgood, RD, LDN, CNSC.

## 2023-02-03 NOTE — Progress Notes (Signed)
Speech Language Pathology Treatment: Dysphagia  Patient Details Name: Curtis Ramos MRN: [id number] DOB: 08-Apr-1932 Today's Date: 02/03/2023 Time: [id number] SLP Time Calculation (min) (ACUTE ONLY): 15 min  Assessment / Plan / Recommendation Clinical Impression  Pt was seen for dysphagia treatment. Pt's RN relayed that the pt did not want breakfast, but did accept some p.o. puree and nectar thick liquids. Pt tolerated dysphagia 2 solids and nectar thick liquids without overt s/s of aspiration. Mastication and oral clearance were adequate. Pt required moderate prompts to consistently observe swallowing precautions, and he was more consistent with the cough than with the secondary swallow thereafter. However, he was consistently responsive to prompts/cues. Pt's diet will be advanced to dysphagia 2 solids and nectar thick liquids with continued observance of swallowing precautions. SLP anticipates that full supervision will remain necessary for consistent observance of precautions and pt's RN verbalized agreement concerning this. SLP will continue to follow pt.     HPI HPI: 89 year old man who presented to [facility] ED 2/29 via EMS after unwitnessed fall at work, patient did not have any complaints prior to fall. He was found down between a radiator and the wall, unresponsive with contusion and deformities to R side of his head. Initially with tachycardic with EMS, then bradycardic. ST elevation noted on EKG and Code STEMI was called, later called off as ST elevation had resolved. Patient lost pulses while in ED and became apneic with cardiac monitoring showing PEA arrest. CPR administered x 9 minutes with Epi x 4 and ROSC, patient never had a shockable rhythm. Intubated peri-arrest 2/28-3/3 then reintubated until 3/6.Marland KitchenPMHx significant for HTN, HFrEF with NICM (cMRI 12/2022 with severe LV dilation, EF 17%, LBBB, followed by Dr. Haroldine Laws), AFib (on Eliquis), MAI infection, GERD, hypothyroidism, history of thyroid  CA.      SLP Plan  Continue with current plan of care      Recommendations for follow up therapy are one component of a multi-disciplinary discharge planning process, led by the attending physician.  Recommendations may be updated based on patient status, additional functional criteria and insurance authorization.    Recommendations  Diet recommendations: Dysphagia 2 (fine chop);Nectar-thick liquid Liquids provided via: Cup;Straw Medication Administration: Crushed with puree Supervision: Staff to assist with self feeding;Full supervision/cueing for compensatory strategies Compensations: Minimize environmental distractions;Slow rate;Small sips/bites;Hard cough after swallow;Other (Comment) (cough then dry swallow) Postural Changes and/or Swallow Maneuvers: Seated upright 90 degrees                Oral Care Recommendations: Oral care BID Follow Up Recommendations: Skilled nursing-short term rehab (<3 hours/day) Assistance recommended at discharge: Frequent or constant Supervision/Assistance SLP Visit Diagnosis: Dysphagia, oropharyngeal phase (R13.12) Plan: Continue with current plan of care         Nikitia Asbill I. Hardin Negus, [place], [place] Office number [phone]  [name]  02/03/2023, 11:36 AM

## 2023-02-04 DIAGNOSIS — A419 Sepsis, unspecified organism: Secondary | ICD-10-CM | POA: Diagnosis not present

## 2023-02-04 DIAGNOSIS — E43 Unspecified severe protein-calorie malnutrition: Secondary | ICD-10-CM | POA: Diagnosis not present

## 2023-02-04 DIAGNOSIS — J95851 Ventilator associated pneumonia: Secondary | ICD-10-CM | POA: Diagnosis not present

## 2023-02-04 DIAGNOSIS — I469 Cardiac arrest, cause unspecified: Secondary | ICD-10-CM | POA: Diagnosis not present

## 2023-02-04 LAB — GLUCOSE, CAPILLARY
Glucose-Capillary: 122 mg/dL — ABNORMAL HIGH (ref 70–99)
Glucose-Capillary: 134 mg/dL — ABNORMAL HIGH (ref 70–99)
Glucose-Capillary: 134 mg/dL — ABNORMAL HIGH (ref 70–99)
Glucose-Capillary: 86 mg/dL (ref 70–99)
Glucose-Capillary: 93 mg/dL (ref 70–99)
Glucose-Capillary: 96 mg/dL (ref 70–99)

## 2023-02-04 LAB — CBC
HCT: 34.1 % — ABNORMAL LOW (ref 39.0–52.0)
Hemoglobin: 11.3 g/dL — ABNORMAL LOW (ref 13.0–17.0)
MCH: 37 pg — ABNORMAL HIGH (ref 26.0–34.0)
MCHC: 33.1 g/dL (ref 30.0–36.0)
MCV: 111.8 fL — ABNORMAL HIGH (ref 80.0–100.0)
Platelets: 374 10*3/uL (ref 150–400)
RBC: 3.05 MIL/uL — ABNORMAL LOW (ref 4.22–5.81)
RDW: 15.9 % — ABNORMAL HIGH (ref 11.5–15.5)
WBC: 13.1 10*3/uL — ABNORMAL HIGH (ref 4.0–10.5)
nRBC: 0 % (ref 0.0–0.2)

## 2023-02-04 LAB — URIC ACID: Uric Acid, Serum: 4.1 mg/dL (ref 3.7–8.6)

## 2023-02-04 MED ORDER — SODIUM BICARBONATE 650 MG PO TABS
650.0000 mg | ORAL_TABLET | Freq: Once | ORAL | Status: AC
  Administered 2023-02-04: 650 mg
  Filled 2023-02-04: qty 1

## 2023-02-04 MED ORDER — PROSOURCE TF20 ENFIT COMPATIBL EN LIQD
60.0000 mL | Freq: Every day | ENTERAL | Status: AC
  Administered 2023-02-04 – 2023-02-06 (×3): 60 mL
  Filled 2023-02-04 (×3): qty 60

## 2023-02-04 MED ORDER — OSMOLITE 1.5 CAL PO LIQD
1000.0000 mL | ORAL | Status: DC
  Administered 2023-02-04 – 2023-02-05 (×2): 1000 mL
  Filled 2023-02-04 (×6): qty 1000

## 2023-02-04 MED ORDER — PANCRELIPASE (LIP-PROT-AMYL) 10440-39150 UNITS PO TABS
20880.0000 [IU] | ORAL_TABLET | Freq: Once | ORAL | Status: AC
  Administered 2023-02-04: 20880 [IU]
  Filled 2023-02-04: qty 2

## 2023-02-04 MED ORDER — FREE WATER
200.0000 mL | Status: DC
  Administered 2023-02-04 – 2023-02-07 (×16): 200 mL

## 2023-02-04 NOTE — Progress Notes (Signed)
Physical Therapy Treatment Patient Details Name: Curtis Ramos MRN: [id number] DOB: 1931-12-26 Today's Date: 02/04/2023   History of Present Illness Pt is 89 year old presented to [facility] on  01/13/23 after unwitnessed fall at work. In ED pt had PEA arrest with CPR x 9 minutes and intubated. Pt with respiratory failure and septic shock due to pulmonary edema, atelectasis post CPR rib/sternal fxs, PNA, and acute on chronic heart failure. Failed extubation 3/3 and reintubated until extubated 3/6. PMH - HTN, heart failure, afib, thyroid CA    PT Comments    Pt was seen for exercises and upon entering noted pt was in a lot of pain on R foot as well as fatigued, lethargic.  Pt agreed to some strengthening, and had been up to chair for some time OOB.  Pt is assisted to do the LE routine with careful assist on R foot.  Has point tenderness on R great toe side of foot so messaged his MD to get further guidance on the change and to see if gout is an issue.  Follow along with him for standing if tolerated, for LE strengthening and to progress with transfers to the chair as pt can perform.  Has difficulty with using RLE between knee pain s/p microsurgery 30 years ago for meniscal issue, and R foot with new hypersensitive pain.  Follow for goals of acute PT as can be done.  Recommendations for follow up therapy are one component of a multi-disciplinary discharge planning process, led by the attending physician.  Recommendations may be updated based on patient status, additional functional criteria and insurance authorization.  Follow Up Recommendations  Skilled nursing-short term rehab (<3 hours/day) Can patient physically be transported by private vehicle: No   Assistance Recommended at Discharge Frequent or constant Supervision/Assistance  Patient can return home with the following A lot of help with walking and/or transfers;A lot of help with bathing/dressing/bathroom;Assistance with cooking/housework;Direct  supervision/assist for medications management;Direct supervision/assist for financial management;Assist for transportation;Help with stairs or ramp for entrance   Equipment Recommendations  Wheelchair (measurements PT);Wheelchair cushion (measurements PT)    Recommendations for Other Services       Precautions / Restrictions Precautions Precautions: Fall Precaution Comments: cortrak Restrictions Weight Bearing Restrictions: No     Mobility  Bed Mobility               General bed mobility comments: fatigued, not OOB    Transfers                   General transfer comment: deferred    Ambulation/Gait               General Gait Details: unable   Stairs             Wheelchair Mobility    Modified Rankin (Stroke Patients Only)       Balance Overall balance assessment: Needs assistance                                          Cognition Arousal/Alertness: Awake/alert Behavior During Therapy: WFL for tasks assessed/performed Overall Cognitive Status: Impaired/Different from baseline Area of Impairment: Memory, Safety/judgement, Awareness, Problem solving                 Orientation Level: Time, Situation   Memory: Decreased short-term memory Following Commands: Follows one step commands with increased time Safety/Judgement:  Decreased awareness of safety, Decreased awareness of deficits Awareness: Intellectual Problem Solving: Slow processing, Decreased initiation General Comments: pt is having a sharp R foot pain that makes standing a challenge, unclear how long he has had it        Exercises General Exercises - Lower Extremity Ankle Circles/Pumps: AROM, AAROM, 5 reps Quad Sets: AROM, 10 reps Gluteal Sets: AROM, 10 reps Heel Slides: AAROM, 10 reps Hip ABduction/ADduction: AAROM, 10 reps Straight Leg Raises: AAROM, 10 reps    General Comments General comments (skin integrity, edema, etc.): pt is in bed  with dramatic pain on R foot when trying to stretch the ankle.  Pt is able to assist ROM but cannot touch great toe side of plantar surface      Pertinent Vitals/Pain Pain Assessment Pain Assessment: Faces Faces Pain Scale: Hurts a little bit Pain Location: ribs and chest Pain Descriptors / Indicators: Guarding    Home Living                          Prior Function            PT Goals (current goals can now be found in the care plan section) Acute Rehab PT Goals Patient Stated Goal: to go home Progress towards PT goals: Not progressing toward goals - comment    Frequency    Min 1X/week      PT Plan Frequency needs to be updated    Co-evaluation              AM-PAC PT "6 Clicks" Mobility   Outcome Measure  Help needed turning from your back to your side while in a flat bed without using bedrails?: A Lot Help needed moving from lying on your back to sitting on the side of a flat bed without using bedrails?: A Lot Help needed moving to and from a bed to a chair (including a wheelchair)?: A Lot Help needed standing up from a chair using your arms (e.g., wheelchair or bedside chair)?: Total Help needed to walk in hospital room?: Total Help needed climbing 3-5 steps with a railing? : Total 6 Click Score: 9    End of Session   Activity Tolerance: Patient tolerated treatment well Patient left: in bed;with call bell/phone within reach;with bed alarm set Nurse Communication: Mobility status;Other (comment) (contacted MD about pain on R foot) PT Visit Diagnosis: Unsteadiness on feet (R26.81);Muscle weakness (generalized) (M62.81)     Time: [id number] PT Time Calculation (min) (ACUTE ONLY): 29 min  Charges:  $Therapeutic Exercise: 8-22 mins $Therapeutic Activity: 8-22 mins            Ramond Dial 02/04/2023, 2:29 PM  Mee Hives, PT PhD Acute Rehab Dept. Number: [place] [facility] [place]

## 2023-02-04 NOTE — Care Management Important Message (Signed)
Important Message  Patient Details  Name: Curtis Ramos MRN: [id number] Date of Birth: [birth year]   Medicare Important Message Given:  Yes     Hannah Beat 02/04/2023, 1:57 PM

## 2023-02-04 NOTE — Progress Notes (Signed)
Speech Language Pathology Treatment: Dysphagia  Patient Details Name: Curtis Ramos MRN: [id number] DOB: 01-04-1932 Today's Date: 02/04/2023 Time: [id number] SLP Time Calculation (min) (ACUTE ONLY): 20 min  Assessment / Plan / Recommendation Clinical Impression  Pt was seen during breakfast for dysphagia treatment. Pt stated that he has been enjoying the upgraded diet. He was alert and cooperative during the session. Pt stated that the meal had been going well prior to the SLP's arrival. Pt was ill-positioned in bed and spontaneous coughing was noted with nectar thick liquids via straw when he was in this position, but signs of aspiration were eliminated (outside of use of compensatory strategies) when he was repositioned. Pt consumed a meal of scrambled eggs, breakfast potatoes, ground sausage, grits, and nectar thick liquids. Pt's observance of swallowing precautions was notably more consistent than when he was seen yesterday. Mastication and oral clearance was functional. Pt's current diet of dysphagia 2 solids and nectar thick liquids will be continued and SLP completed pt's calorie count sheet and placed it in the envelope. SLP will continue to follow pt.     HPI HPI: 89 year old man who presented to [facility] ED 2/29 via EMS after unwitnessed fall at work, patient did not have any complaints prior to fall. He was found down between a radiator and the wall, unresponsive with contusion and deformities to R side of his head. Initially with tachycardic with EMS, then bradycardic. ST elevation noted on EKG and Code STEMI was called, later called off as ST elevation had resolved. Patient lost pulses while in ED and became apneic with cardiac monitoring showing PEA arrest. CPR administered x 9 minutes with Epi x 4 and ROSC, patient never had a shockable rhythm. Intubated peri-arrest 2/28-3/3 then reintubated until 3/6.Marland KitchenPMHx significant for HTN, HFrEF with NICM (cMRI 12/2022 with severe LV dilation, EF 17%, LBBB,  followed by Dr. Haroldine Ramos), AFib (on Eliquis), MAI infection, GERD, hypothyroidism, history of thyroid CA.      SLP Plan  Continue with current plan of care      Recommendations for follow up therapy are one component of a multi-disciplinary discharge planning process, led by the attending physician.  Recommendations may be updated based on patient status, additional functional criteria and insurance authorization.    Recommendations  Diet recommendations: Dysphagia 2 (fine chop);Nectar-thick liquid Liquids provided via: Cup;Straw Medication Administration: Crushed with puree Supervision: Staff to assist with self feeding;Full supervision/cueing for compensatory strategies Compensations: Minimize environmental distractions;Slow rate;Small sips/bites;Hard cough after swallow;Other (Comment) (cough then dry swallow) Postural Changes and/or Swallow Maneuvers: Seated upright 90 degrees                Oral Care Recommendations: Oral care BID Follow Up Recommendations: Skilled nursing-short term rehab (<3 hours/day) Assistance recommended at discharge: Frequent or constant Supervision/Assistance SLP Visit Diagnosis: Dysphagia, oropharyngeal phase (R13.12) Plan: Continue with current plan of care       Curtis Ramos I. Curtis Ramos, Curtis Ramos, Curtis Ramos Office number [phone]   Curtis Ramos  02/04/2023, 9:55 AM

## 2023-02-04 NOTE — Care Management Important Message (Signed)
Important Message  Patient Details  Name: Curtis Ramos MRN: [id number] Date of Birth: November 03, 1932   Medicare Important Message Given:  Yes     Hannah Beat 02/04/2023, 1:56 PM

## 2023-02-04 NOTE — Progress Notes (Addendum)
Nutrition Follow-up  DOCUMENTATION CODES:   Severe malnutrition in context of chronic illness  INTERVENTION:  - Restart tube feeds once TF unclogged:   Will resume full feeds once TF is unclogged:  -Osmolite 1.5 at 60 mL/hour (1440 mL) -PROSource TF20 60 mL daily -Provides: 2240 kcal, 110 grams of protein, 1097 mL H2O daily   With current free water flush of 200 mL every 4 hours, pt receives a total of 2297 mL H2O daily including water in tube feeds.  NUTRITION DIAGNOSIS:   Severe Malnutrition related to chronic illness (CHF) as evidenced by severe fat depletion, severe muscle depletion. - ongoing   GOAL:   Patient will meet greater than or equal to 90% of their needs - Met with TF and PO.   MONITOR:   Labs, Vent status, TF tolerance  REASON FOR ASSESSMENT:   Ventilator, Consult Enteral/tube feeding initiation and management  ASSESSMENT:   Pt with hx of CHF, HTN, GERD, and PAF presented to ED after being found down with a contusion and deformities to the right side of the head. Suffered PEA arrest in ED and CPR x 9 minutes.  Meds reviewed: cozaar, remeron, aldactone. Labs reviewed: BUN elevated.   Calorie Count Note  24 hour calorie count ordered.  Diet: Dys 2, Nectar thick Supplements: Ensure TID  Breakfast: 544 kcals, 17 gm protein  Lunch: 224 kcals, 7 gm protein  Dinner: 203 kcals, 17 gm protein  Supplements: None   Total intake: 971 kcal (51% of minimum estimated needs)  41 protein (43% of minimum estimated needs)  Pt is currently unable to meet his needs with PO intakes alone. Per MD note, IR has been re-consulted to place PEG. The pt's Cortrak is clogged but unclogging protocol has been ordered. RD recommends restarting TF via Cortrak when unclogged.   Diet Order:   Diet Order             DIET DYS 2 Room service appropriate? No; Fluid consistency: Nectar Thick  Diet effective now                   EDUCATION NEEDS:   Not appropriate for  education at this time  Skin:  Skin Assessment: Skin Integrity Issues: Skin Integrity Issues:: Other (Comment) Other: laceration to head  Last BM:  3/19 - Type 6  Height:   Ht Readings from Last 1 Encounters:  01/18/23 5\' 11"  (1.803 m)    Weight:   Wt Readings from Last 1 Encounters:  02/04/23 56.4 kg    Ideal Body Weight:  78.2 kg  BMI:  Body mass index is 17.34 kg/m.  Estimated Nutritional Needs:   Kcal:  1900-2100 kcal/d  Protein:  95-105g/d  Fluid:  1.8-2L/d  Thalia Bloodgood, RD, LDN, CNSC.

## 2023-02-04 NOTE — Progress Notes (Signed)
PROGRESS NOTE  Curtis Ramos [id number] DOB: 10-11-1932   PCP: Leonides Sake, MD  Patient is from: Home.  DOA: 01/13/2023 LOS: 22  Chief complaints No chief complaint on file.    Brief Narrative / Interim history: 89 year old M with PMH of systolic CHF, NICM, A-fib on Eliquis, HTN, hypothyroidism, thyroid cancer and GERD presented to ED on 2/29 after unwitnessed fall at work.  As per the PCCM notes patient was found down between the radiator and the wall unresponsive with contusion.  On arrival to ED patient became apneic and lost pulses and cardiac monitoring showed PEA arrest.  CPR administered for 9 minutes with epi x 4 and ROSC achieved.  Patient was intubated and admitted to ICU. cardiology was consulted. CT of the chest abdomen and pelvis showed known displaced transverse fractures of the mid lower sternal body, acute fracture of the coastal cartilage of bilateral second through sixth ribs, acute fracture of the anterior second to 7 ribs.  He was started on Unasyn for aspiration pneumonitis, later transitioned to cefepime for Pseudomonas VAP.    Patient was extubated on 01/19/2023, and transferred to [facility] on 01/21/2023. On 3/9, he became tachypneic and requiried BIPAP for 24 hours, and transitioned to HF oxygen.  Patient was weaned off oxygen to room air on 3/12.  Remains on TF via cortrack due to significant risk for aspiration.  Hospital course significant for delirium that seems to have resolved.  After discussion with family, IR was consulted for G-tube.  However, SLP reevaluated patient and advanced diet to dysphagia-1 and family wanted to hold off G-tube.  SLP advancing him further to dysphagia 2 diet.  Unfortunately, patient's p.o. intake is very poor.  After discussion with RD and patient's son, IR reconsulted for G-tube.  Final disposition SNF after G-tube which is tentatively planned for 02/08/2023.    Subjective: Seen and examined earlier this morning.  No major events  overnight of this morning.  No complaints but not a great historian.  It seems he ate about 30% of his lunch and 70% of his dinner yesterday  Objective: Vitals:   02/03/23 1402 02/03/23 2039 02/04/23 0326 02/04/23 1422  BP: (!) 105/54 119/61 121/70 114/64  Pulse: 72 75 71 76  Resp: 18 17  (!) 22  Temp: 98 F (36.7 C) 98 F (36.7 C) 98.3 F (36.8 C) 98.3 F (36.8 C)  TempSrc: Oral Oral Oral Oral  SpO2: 95% 98% 99% 97%  Weight:   56.4 kg   Height:        Examination:  GENERAL: Appears frail no apparent distress. HEENT: MMM.  Vision and hearing grossly intact.  Feeding tube in place. NECK: Supple.  No apparent JVD.  RESP:  No IWOB.  Fair aeration bilaterally. CVS:  RRR. Heart sounds normal.  ABD/GI/GU: BS+. Abd soft, NTND.  MSK/EXT:   No apparent deformity. Moves extremities. No edema.  SKIN: no apparent skin lesion or wound NEURO: Sleepy but wakes to voice easily.  Fairly oriented..  No apparent focal neuro deficit. PSYCH: Calm. Normal affect.   Procedures:  Intubation and mechanical ventilation  Microbiology summarized: 3/3-respiratory culture with Pseudomonas aeruginosa. 3/9-blood cultures NGTD  Assessment and plan: Principal Problem:   Cardiac arrest [facility]) Active Problems:   AKI (acute kidney injury) ([place])   Chronic systolic CHF (congestive heart failure) (HCC)   Hypothyroidism   Septic shock (HCC)   Acute respiratory failure with hypoxia (HCC)   Protein-calorie malnutrition, severe   Hematochezia   VAP (  ventilator-associated pneumonia) ([place])   Delirium   Oropharyngeal dysphagia   Elevated liver enzymes   Acute urinary retention  Dysphagia: -Advance to dysphagia 2 diet by SLP but p.o. intake remains poor. -RD to resume full tube feed once cortrak and clogged. -IR reconsulted.  G-tube tentatively scheduled for 3/26.  PEA arrest/ Cardiogenic shock: -2/29 PEA arrest--> 9 min CPR 4 rounds epi. Unshockable rhythm.  -Rib fracture 2-7 ribs - Using lidocaine  patch.  -Off pressors. Central line out. Weaned off vasopressors. -Continue amiodarone to suppress PVCs.   NICM/Chronic systolic heart failure: TTE with LVEF of 20%.  Euvolemic.  On RA. -Cardiology following intermittently -On low-dose Aldactone.  Added low-dose losartan on 3/20.  Acute respiratory failure with hypoxia: Multifactorial-PEA arrest, sternal and rib fractures and aspiration pneumonia. -Extubated on 3/6.  Currently on room air. -Pulmonary toilet and aspiration precaution  Septic shock due to Pseudomonas VAP/COVID : Resolved.  Blood cultures NGTD. -Completing course of IV cefepime on 3/13   Chronic atrial fibrillation with frequent PVC's -On amiodarone 200 mg daily.  -Not on anticoagulation due to hematochezia.   Hematochezia: Reportedly had intermittent hematochezia, and anticoagulation discontinued.  Not a candidate for intervention or procedure per GI.  H&H stable now.  Anemia panel suggests anemia of chronic disease. Recent Labs    01/24/23 0213 01/25/23 0130 01/25/23 1325 01/25/23 1347 01/26/23 0147 01/27/23 0143 01/28/23 0246 01/30/23 0212 02/03/23 0536 02/04/23 1307  HGB 9.2* 9.4* 9.7* 9.5* 10.4* 9.3* 9.6* 10.4* 10.9* 11.3*  -Monitor intermittently.   Hypothyroidism/history of thyroid cancer:  -Continue home Synthroid -Recheck TSH outpatient  Hypernatremia/hypophosphatemia/hypocalcemia: Resolved.   AKI due to shock: Resolved.   Urinary retention: Seems to have resolved.  Passed voiding trial.  Elevated liver enzymes: Could be ischemic or congestive.  Delirium: Stable.  Fairly oriented but limited insight. -Delirium precaution   Severe malnutrition/dysphagia/poor p.o. intake Body mass index is 17.34 kg/m. Nutrition Problem: Severe Malnutrition Etiology: chronic illness (CHF) Signs/Symptoms: severe fat depletion, severe muscle depletion Interventions: Refer to RD note for recommendations Plan for G-tube   DVT prophylaxis:  enoxaparin  (LOVENOX) injection 40 mg Start: 01/27/23 2200 SCDs Start: 01/13/23 1327  Code Status: Full code Family Communication: None at bedside. Level of care: Med-Surg Status is: Inpatient Remains inpatient appropriate because: Dysphagia and decreased oral intake   Final disposition: SNF Consultants:  Cardiology GI Pulmonology Palliative medicine IR  35 minutes with more than 50% spent in reviewing records, counseling patient/family and coordinating care.   Sch Meds:  Scheduled Meds:  amiodarone  200 mg Oral Daily   enoxaparin (LOVENOX) injection  40 mg Subcutaneous Q24H   feeding supplement  237 mL Oral TID BM   feeding supplement (PROSource TF20)  60 mL Per Tube Daily   free water  200 mL Per Tube Q4H   levothyroxine  175 mcg Oral Q0600   lidocaine  1 patch Transdermal Q24H   losartan  12.5 mg Oral Daily   mirtazapine  7.5 mg Oral QHS   mouth rinse  15 mL Mouth Rinse 4 times per day   pantoprazole (PROTONIX) IV  40 mg Intravenous Q12H   spironolactone  12.5 mg Oral Daily   Continuous Infusions:  sodium chloride Stopped (01/25/23 1632)   feeding supplement (OSMOLITE 1.5 CAL)     PRN Meds:.acetaminophen, docusate, lip balm, ondansetron (ZOFRAN) IV, mouth rinse, polyethylene glycol, senna-docusate  Antimicrobials: Anti-infectives (From admission, onward)    Start     Dose/Rate Route Frequency Ordered Stop   01/17/23  1100  ceFEPIme (MAXIPIME) 2 g in sodium chloride 0.9 % 100 mL IVPB        2 g 200 mL/hr over 30 Minutes Intravenous Every 12 hours 01/17/23 1040 01/26/23 2359   01/16/23 1400  cefTRIAXone (ROCEPHIN) 2 g in sodium chloride 0.9 % 100 mL IVPB  Status:  Discontinued        2 g 200 mL/hr over 30 Minutes Intravenous Every 24 hours 01/16/23 1136 01/17/23 1028   01/15/23 0815  Ampicillin-Sulbactam (UNASYN) 3 g in sodium chloride 0.9 % 100 mL IVPB  Status:  Discontinued        3 g 200 mL/hr over 30 Minutes Intravenous Every 6 hours 01/15/23 0804 01/16/23 1136    01/13/23 1345  Ampicillin-Sulbactam (UNASYN) 3 g in sodium chloride 0.9 % 100 mL IVPB  Status:  Discontinued        3 g 200 mL/hr over 30 Minutes Intravenous Every 12 hours 01/13/23 1339 01/15/23 0804        I have personally reviewed the following labs and images: CBC: Recent Labs  Lab 01/30/23 0212 02/03/23 0536 02/04/23 1307  WBC 10.5 13.0* 13.1*  HGB 10.4* 10.9* 11.3*  HCT 31.7* 32.7* 34.1*  MCV 110.8* 110.8* 111.8*  PLT 417* 419* 374   BMP &GFR Recent Labs  Lab 01/30/23 0212 02/03/23 0536 02/03/23 0537  NA 142  --  138  K 4.2  --  4.2  CL 111  --  107  CO2 23  --  24  GLUCOSE 108*  --  98  BUN 33*  --  33*  CREATININE 1.01  --  1.10  CALCIUM 10.2  --  10.3  MG 2.5* 2.2  --   PHOS 2.9  --  2.9   Estimated Creatinine Clearance: 37 mL/min (by C-G formula based on SCr of 1.1 mg/dL). Liver & Pancreas: Recent Labs  Lab 01/30/23 0212 02/03/23 0537  AST 37  --   ALT 80*  --   ALKPHOS 153*  --   BILITOT 0.4  --   PROT 6.7  --   ALBUMIN 2.5* 2.7*   No results for input(s): "LIPASE", "AMYLASE" in the last 168 hours. No results for input(s): "AMMONIA" in the last 168 hours. Diabetic: No results for input(s): "HGBA1C" in the last 72 hours. Recent Labs  Lab 02/03/23 2033 02/04/23 0016 02/04/23 0356 02/04/23 0802 02/04/23 1209  GLUCAP 98 93 96 86 134*   Cardiac Enzymes: No results for input(s): "CKTOTAL", "CKMB", "CKMBINDEX", "TROPONINI" in the last 168 hours.  Recent Labs    06/18/22 1331 10/05/22 0848  PROBNP 1,795* 3,434*   Coagulation Profile: No results for input(s): "INR", "PROTIME" in the last 168 hours. Thyroid Function Tests: No results for input(s): "TSH", "T4TOTAL", "FREET4", "T3FREE", "THYROIDAB" in the last 72 hours. Lipid Profile: No results for input(s): "CHOL", "HDL", "LDLCALC", "TRIG", "CHOLHDL", "LDLDIRECT" in the last 72 hours. Anemia Panel: No results for input(s): "VITAMINB12", "FOLATE", "FERRITIN", "TIBC", "IRON",  "RETICCTPCT" in the last 72 hours.  Urine analysis:    Component Value Date/Time   COLORURINE YELLOW 01/13/2023 [place] 01/13/2023 1334   LABSPEC 1.020 01/13/2023 1334   PHURINE 6.0 01/13/2023 1334   GLUCOSEU >=500 (A) 01/13/2023 1334   HGBUR SMALL (A) 01/13/2023 1334   BILIRUBINUR NEGATIVE 01/13/2023 1334   KETONESUR NEGATIVE 01/13/2023 1334   PROTEINUR 30 (A) 01/13/2023 1334   NITRITE NEGATIVE 01/13/2023 1334   LEUKOCYTESUR NEGATIVE 01/13/2023 1334   Sepsis Labs: Invalid input(s): "  PROCALCITONIN", "LACTICIDVEN"  Microbiology: No results found for this or any previous visit (from the past 240 hour(s)).   Radiology Studies: CT ABDOMEN WO CONTRAST  Result Date: 02/04/2023 CLINICAL DATA:  89 year old male referred for possible gastrostomy EXAM: CT ABDOMEN WITHOUT CONTRAST TECHNIQUE: Multidetector CT imaging of the abdomen was performed following the standard protocol without IV contrast. RADIATION DOSE REDUCTION: This exam was performed according to the departmental dose-optimization program which includes automated exposure control, adjustment of the mA and/or kV according to patient size and/or use of iterative reconstruction technique. COMPARISON:  01/13/2023 FINDINGS: Lower chest: Linear opacities at the left greater than right lung bases, in the region of previous multifocal infection. Interval improvement in the mixed ground-glass and nodular opacities at the lung bases with some mild residual centrilobular opacities in the left lower lobe. No pleural effusion. Hepatobiliary: Unremarkable liver.  Unremarkable gallbladder. Pancreas: Unremarkable Spleen: Unremarkable Adrenals/Urinary Tract: Unremarkable visualized adrenal glands. No hydronephrosis or nephrolithiasis. Stomach/Bowel: Weighted tip enteric feeding tube crosses the stomach and terminates in the proximal duodenum. Stomach is decompressed. Unremarkable visualized small bowel. Some retained enteric contrast  within the visualized colon. The splenic flexure overlies the fundus of the stomach, with the transverse colon maintained in a usual anatomic arrangement with the greater curvature of the stomach. Vascular/Lymphatic: Atherosclerotic changes of the abdominal aorta. No adenopathy. Other: None Musculoskeletal: Degenerative changes of the spine. IMPRESSION: Near complete resolution of the previous multifocal infection at the lung bases with some residual scarring/atelectasis. Mild residual centrilobular nodularity in the left lower lobe compatible with resolving infection. No acute finding of the visualized abdomen. Aortic Atherosclerosis (ICD10-I70.0). Electronically Signed   By: Corrie Mckusick D.O.   On: 02/04/2023 10:49      Brindy Higginbotham T. Anselmo  If 7PM-7AM, please contact night-coverage [email or web address] 02/04/2023, 2:49 PM

## 2023-02-05 DIAGNOSIS — I469 Cardiac arrest, cause unspecified: Secondary | ICD-10-CM | POA: Diagnosis not present

## 2023-02-05 DIAGNOSIS — I5020 Unspecified systolic (congestive) heart failure: Secondary | ICD-10-CM

## 2023-02-05 DIAGNOSIS — A419 Sepsis, unspecified organism: Secondary | ICD-10-CM | POA: Diagnosis not present

## 2023-02-05 DIAGNOSIS — J95851 Ventilator associated pneumonia: Secondary | ICD-10-CM | POA: Diagnosis not present

## 2023-02-05 DIAGNOSIS — E43 Unspecified severe protein-calorie malnutrition: Secondary | ICD-10-CM | POA: Diagnosis not present

## 2023-02-05 LAB — GLUCOSE, CAPILLARY
Glucose-Capillary: 113 mg/dL — ABNORMAL HIGH (ref 70–99)
Glucose-Capillary: 115 mg/dL — ABNORMAL HIGH (ref 70–99)
Glucose-Capillary: 128 mg/dL — ABNORMAL HIGH (ref 70–99)
Glucose-Capillary: 133 mg/dL — ABNORMAL HIGH (ref 70–99)
Glucose-Capillary: 143 mg/dL — ABNORMAL HIGH (ref 70–99)
Glucose-Capillary: 99 mg/dL (ref 70–99)

## 2023-02-05 LAB — RENAL FUNCTION PANEL
Albumin: 2.6 g/dL — ABNORMAL LOW (ref 3.5–5.0)
Anion gap: 6 (ref 5–15)
BUN: 37 mg/dL — ABNORMAL HIGH (ref 8–23)
CO2: 23 mmol/L (ref 22–32)
Calcium: 10 mg/dL (ref 8.9–10.3)
Chloride: 108 mmol/L (ref 98–111)
Creatinine, Ser: 1.09 mg/dL (ref 0.61–1.24)
GFR, Estimated: >60 mL/min (ref >60)
Glucose, Bld: 107 mg/dL — ABNORMAL HIGH (ref 70–99)
Phosphorus: 2.4 mg/dL — ABNORMAL LOW (ref 2.5–4.6)
Potassium: 4.3 mmol/L (ref 3.5–5.1)
Sodium: 137 mmol/L (ref 135–145)

## 2023-02-05 LAB — MAGNESIUM: Magnesium: 1.9 mg/dL (ref 1.7–2.4)

## 2023-02-05 LAB — CBC
HCT: 32 % — ABNORMAL LOW (ref 39.0–52.0)
Hemoglobin: 11 g/dL — ABNORMAL LOW (ref 13.0–17.0)
MCH: 39.1 pg — ABNORMAL HIGH (ref 26.0–34.0)
MCHC: 34.4 g/dL (ref 30.0–36.0)
MCV: 113.9 fL — ABNORMAL HIGH (ref 80.0–100.0)
Platelets: 347 10*3/uL (ref 150–400)
RBC: 2.81 MIL/uL — ABNORMAL LOW (ref 4.22–5.81)
RDW: 15.9 % — ABNORMAL HIGH (ref 11.5–15.5)
WBC: 13.3 10*3/uL — ABNORMAL HIGH (ref 4.0–10.5)
nRBC: 0 % (ref 0.0–0.2)

## 2023-02-05 NOTE — Plan of Care (Signed)

## 2023-02-05 NOTE — Progress Notes (Signed)
PROGRESS NOTE  Curtis Ramos [id number] DOB: [birth year]   PCP: Leonides Sake, MD  Patient is from: Home.  DOA: 01/13/2023 LOS: 23  Chief complaints No chief complaint on file.    Brief Narrative / Interim history: 89 year old M with PMH of systolic CHF, NICM, A-fib on Eliquis, HTN, hypothyroidism, thyroid cancer and GERD presented to ED on 2/29 after unwitnessed fall at work.  As per the PCCM notes patient was found down between the radiator and the wall unresponsive with contusion.  On arrival to ED patient became apneic and lost pulses and cardiac monitoring showed PEA arrest.  CPR administered for 9 minutes with epi x 4 and ROSC achieved.  Patient was intubated and admitted to ICU. cardiology was consulted. CT of the chest abdomen and pelvis showed known displaced transverse fractures of the mid lower sternal body, acute fracture of the coastal cartilage of bilateral second through sixth ribs, acute fracture of the anterior second to 7 ribs.  He was started on Unasyn for aspiration pneumonitis, later transitioned to cefepime for Pseudomonas VAP.    Patient was extubated on 01/19/2023, and transferred to [facility] on 01/21/2023. On 3/9, he became tachypneic and requiried BIPAP for 24 hours, and transitioned to HF oxygen.  Patient was weaned off oxygen to room air on 3/12.  Remains on TF via cortrack due to significant risk for aspiration.  Hospital course significant for delirium that seems to have resolved.  After discussion with family, IR was consulted for G-tube.  However, SLP reevaluated patient and advanced diet to dysphagia-1 and family wanted to hold off G-tube.  SLP advancing him further to dysphagia 2 diet.  Unfortunately, patient's p.o. intake is very poor.  After discussion with RD and patient's son, IR reconsulted for G-tube.  Final disposition SNF after G-tube which is tentatively planned for 02/08/2023.    Subjective: Seen and examined earlier this morning.  No major events  overnight of this morning.  Back on continuous tube feed.   Objective: Vitals:   02/04/23 1930 02/05/23 0347 02/05/23 0456 02/05/23 0803  BP: 111/66 119/73  (!) 94/58  Pulse: 79 83  97  Resp: 17 19  16   Temp: 97.6 F (36.4 C) 97.8 F (36.6 C)  98 F (36.7 C)  TempSrc: Oral Oral  Oral  SpO2: 98% 98%  96%  Weight:   58.4 kg   Height:        Examination: GENERAL: Appears frail.  No apparent distress. HEENT: MMM.  Vision and hearing grossly intact.  Feeding tube in place. NECK: Supple.  No apparent JVD.  RESP:  No IWOB.  Fair aeration bilaterally. CVS:  RRR. Heart sounds normal.  ABD/GI/GU: BS+. Abd soft, NTND.  MSK/EXT:   No apparent deformity. Moves extremities. No edema.  SKIN: no apparent skin lesion or wound NEURO: Awake and alert. Oriented appropriately.  No apparent focal neuro deficit. PSYCH: Calm. Normal affect.   Procedures:  Intubation and mechanical ventilation  Microbiology summarized: 3/3-respiratory culture with Pseudomonas aeruginosa. 3/9-blood cultures NGTD  Assessment and plan: Principal Problem:   Cardiac arrest ([place]) Active Problems:   AKI (acute kidney injury) ([place])   Chronic systolic CHF (congestive heart failure) (HCC)   Hypothyroidism   Septic shock (HCC)   Acute respiratory failure with hypoxia (HCC)   Protein-calorie malnutrition, severe   Hematochezia   VAP (ventilator-associated pneumonia) ([place])   Delirium   Oropharyngeal dysphagia   Elevated liver enzymes   Acute urinary retention  Dysphagia: -Advance to dysphagia  2 diet by SLP but p.o. intake remains poor. -Cortrak unclogged.  Dietitian started continuous tube feed. -IR reconsulted.  G-tube tentatively scheduled for 3/26.  PEA arrest/ Cardiogenic shock: -2/29 PEA arrest--> 9 min CPR 4 rounds epi. Unshockable rhythm.  -Rib fracture 2-7 ribs - Using lidocaine patch.  -Off pressors. Central line out. Weaned off vasopressors. -Continue amiodarone to suppress PVCs.   NICM/Chronic  systolic heart failure: TTE with LVEF of 20%.  Euvolemic.  On RA. -Cardiology following intermittently -On low-dose Aldactone.  Added low-dose losartan on 3/20.  Acute respiratory failure with hypoxia: Multifactorial-PEA arrest, sternal and rib fractures and aspiration pneumonia. -Extubated on 3/6.  Currently on room air. -Pulmonary toilet and aspiration precaution  Septic shock due to Pseudomonas VAP/COVID : Resolved.  Blood cultures NGTD. -Completing course of IV cefepime on 3/13   Chronic atrial fibrillation with frequent PVC's -On amiodarone 200 mg daily.  -Not on anticoagulation due to hematochezia.   Hematochezia: Reportedly had intermittent hematochezia, and anticoagulation discontinued.  Not a candidate for intervention or procedure per GI.  H&H stable now.  Anemia panel suggests anemia of chronic disease. Recent Labs    01/25/23 0130 01/25/23 1325 01/25/23 1347 01/26/23 0147 01/27/23 0143 01/28/23 0246 01/30/23 0212 02/03/23 0536 02/04/23 1307 02/05/23 0117  HGB 9.4* 9.7* 9.5* 10.4* 9.3* 9.6* 10.4* 10.9* 11.3* 11.0*  -Monitor intermittently.   Hypothyroidism/history of thyroid cancer:  -Continue home Synthroid -Recheck TSH outpatient  Hypernatremia/hypophosphatemia/hypocalcemia: Resolved.   AKI due to shock: Resolved.   Urinary retention: Seems to have resolved.  Passed voiding trial.  Elevated liver enzymes: Could be ischemic or congestive.  Delirium: Stable.  Fairly oriented but limited insight. -Delirium precaution   Severe malnutrition/dysphagia/poor p.o. intake Body mass index is 17.96 kg/m. Nutrition Problem: Severe Malnutrition Etiology: chronic illness (CHF) Signs/Symptoms: severe fat depletion, severe muscle depletion Interventions: Refer to RD note for recommendations Plan for G-tube   DVT prophylaxis:  enoxaparin (LOVENOX) injection 40 mg Start: 01/27/23 2200 SCDs Start: 01/13/23 1327  Code Status: Full code Family Communication: None at  bedside. Level of care: Med-Surg Status is: Inpatient Remains inpatient appropriate because: Dysphagia and decreased oral intake   Final disposition: SNF Consultants:  Cardiology GI Pulmonology Palliative medicine IR  35 minutes with more than 50% spent in reviewing records, counseling patient/family and coordinating care.   Sch Meds:  Scheduled Meds:  amiodarone  200 mg Oral Daily   enoxaparin (LOVENOX) injection  40 mg Subcutaneous Q24H   feeding supplement  237 mL Oral TID BM   feeding supplement (PROSource TF20)  60 mL Per Tube Daily   free water  200 mL Per Tube Q4H   levothyroxine  175 mcg Oral Q0600   lidocaine  1 patch Transdermal Q24H   losartan  12.5 mg Oral Daily   mirtazapine  7.5 mg Oral QHS   mouth rinse  15 mL Mouth Rinse 4 times per day   pantoprazole (PROTONIX) IV  40 mg Intravenous Q12H   spironolactone  12.5 mg Oral Daily   Continuous Infusions:  sodium chloride Stopped (01/25/23 1632)   feeding supplement (OSMOLITE 1.5 CAL) 60 mL/hr at 02/05/23 1300   PRN Meds:.acetaminophen, docusate, lip balm, ondansetron (ZOFRAN) IV, mouth rinse, polyethylene glycol, senna-docusate  Antimicrobials: Anti-infectives (From admission, onward)    Start     Dose/Rate Route Frequency Ordered Stop   01/17/23 1100  ceFEPIme (MAXIPIME) 2 g in sodium chloride 0.9 % 100 mL IVPB        2 g 200  mL/hr over 30 Minutes Intravenous Every 12 hours 01/17/23 1040 01/26/23 2359   01/16/23 1400  cefTRIAXone (ROCEPHIN) 2 g in sodium chloride 0.9 % 100 mL IVPB  Status:  Discontinued        2 g 200 mL/hr over 30 Minutes Intravenous Every 24 hours 01/16/23 1136 01/17/23 1028   01/15/23 0815  Ampicillin-Sulbactam (UNASYN) 3 g in sodium chloride 0.9 % 100 mL IVPB  Status:  Discontinued        3 g 200 mL/hr over 30 Minutes Intravenous Every 6 hours 01/15/23 0804 01/16/23 1136   01/13/23 1345  Ampicillin-Sulbactam (UNASYN) 3 g in sodium chloride 0.9 % 100 mL IVPB  Status:  Discontinued         3 g 200 mL/hr over 30 Minutes Intravenous Every 12 hours 01/13/23 1339 01/15/23 0804        I have personally reviewed the following labs and images: CBC: Recent Labs  Lab 01/30/23 0212 02/03/23 0536 02/04/23 1307 02/05/23 0117  WBC 10.5 13.0* 13.1* 13.3*  HGB 10.4* 10.9* 11.3* 11.0*  HCT 31.7* 32.7* 34.1* 32.0*  MCV 110.8* 110.8* 111.8* 113.9*  PLT 417* 419* 374 347   BMP &GFR Recent Labs  Lab 01/30/23 0212 02/03/23 0536 02/03/23 0537 02/05/23 0117  NA 142  --  138 137  K 4.2  --  4.2 4.3  CL 111  --  107 108  CO2 23  --  24 23  GLUCOSE 108*  --  98 107*  BUN 33*  --  33* 37*  CREATININE 1.01  --  1.10 1.09  CALCIUM 10.2  --  10.3 10.0  MG 2.5* 2.2  --  1.9  PHOS 2.9  --  2.9 2.4*   Estimated Creatinine Clearance: 38.7 mL/min (by C-G formula based on SCr of 1.09 mg/dL). Liver & Pancreas: Recent Labs  Lab 01/30/23 0212 02/03/23 0537 02/05/23 0117  AST 37  --   --   ALT 80*  --   --   ALKPHOS 153*  --   --   BILITOT 0.4  --   --   PROT 6.7  --   --   ALBUMIN 2.5* 2.7* 2.6*   No results for input(s): "LIPASE", "AMYLASE" in the last 168 hours. No results for input(s): "AMMONIA" in the last 168 hours. Diabetic: No results for input(s): "HGBA1C" in the last 72 hours. Recent Labs  Lab 02/04/23 2026 02/05/23 0018 02/05/23 0355 02/05/23 0802 02/05/23 1149  GLUCAP 122* 128* 133* 99 143*   Cardiac Enzymes: No results for input(s): "CKTOTAL", "CKMB", "CKMBINDEX", "TROPONINI" in the last 168 hours.  Recent Labs    06/18/22 1331 10/05/22 0848  PROBNP 1,795* 3,434*   Coagulation Profile: No results for input(s): "INR", "PROTIME" in the last 168 hours. Thyroid Function Tests: No results for input(s): "TSH", "T4TOTAL", "FREET4", "T3FREE", "THYROIDAB" in the last 72 hours. Lipid Profile: No results for input(s): "CHOL", "HDL", "LDLCALC", "TRIG", "CHOLHDL", "LDLDIRECT" in the last 72 hours. Anemia Panel: No results for input(s): "VITAMINB12",  "FOLATE", "FERRITIN", "TIBC", "IRON", "RETICCTPCT" in the last 72 hours.  Urine analysis:    Component Value Date/Time   COLORURINE YELLOW 01/13/2023 [place] 01/13/2023 1334   LABSPEC 1.020 01/13/2023 1334   PHURINE 6.0 01/13/2023 1334   GLUCOSEU >=500 (A) 01/13/2023 1334   HGBUR SMALL (A) 01/13/2023 1334   BILIRUBINUR NEGATIVE 01/13/2023 1334   KETONESUR NEGATIVE 01/13/2023 1334   PROTEINUR 30 (A) 01/13/2023 1334   NITRITE NEGATIVE 01/13/2023  [place] 01/13/2023 1334   Sepsis Labs: Invalid input(s): "PROCALCITONIN", "LACTICIDVEN"  Microbiology: No results found for this or any previous visit (from the past 240 hour(s)).   Radiology Studies: No results found.    Kasiyah Platter T. [place]  If 7PM-7AM, please contact night-coverage [email or web address] 02/05/2023, 1:44 PM

## 2023-02-05 NOTE — Progress Notes (Signed)
To Floor via Bed in stable condition. Family at bedside. Oriented to room, questions answered. Currently laying in bed with eyes closed. Resp Even, non-labored. SR up X3, bed in low position

## 2023-02-05 NOTE — Progress Notes (Signed)
Palliative Medicine Progress Note   Patient Name: Curtis Ramos       Date: 02/05/2023 DOB: 02-12-1932  Age: 89 y.o. MRN#: [id number] Attending Physician: Mercy Riding, MD Primary Care Physician: Leonides Sake, MD Admit Date: 01/13/2023    HPI/Patient Profile: Palliative Care consult requested for goals of care discussion in this 89 y.o. male  with past medical history of atrial fibrillation ([place]), GERD, hypothyroidism, thyroid cancer, hypertension, systolic heart failure with severe LV dilation, EF 17%, LBBB as managed by Dr. Haroldine Laws, [place].  He was admitted on 01/13/2023 after being found down at work where he fell between the radiator and the wall.  At that time he was unresponsive.  Patient suffered PEA arrest undergoing CPR for 9 minutes before ROSC while in the ED.  Extubated on 3/3 but decompensated and required reintubation. Extubated again on 3/6. He was weaned off oxygen on 3/12. Hospital course has been complicated by delirium and dysphagia.   Subjective: Chart reviewed. Note that PT session yesterday 3/22 was limited by right foot pain as well as fatigue.  Calorie count is in progress.  Bedside visit with patient and his son/Donald. Discussed the need for adequate oral intake to ensure he is meeting his nutritional needs.  Reviewed he has malnutrition in the setting of chronic and critical illness.  Discussed that he continues to receive tube feedings via cortrak, but that he cannot go to SNF/rehab with this in place and would need a PEG.  Discussed the importance of continued conversation with patient, family, and the medical team regarding overall plan of care and treatment options.  I recommended outpatient follow-up at discharge, to provide ongoing support and facilitate goals of  care conversations pending patient's clinical course after discharge.  Son is agreeable to outpatient palliative referral.  Objective:  Physical Exam Vitals reviewed.  Constitutional:      General: He is not in acute distress.    Appearance: He is ill-appearing.  Pulmonary:     Effort: Pulmonary effort is normal.  Neurological:     Mental Status: He is alert.     Motor: Weakness present.             Vital Signs: BP (!) 94/58 (BP Location: Right Arm)   Pulse 97   Temp 98 F (36.7 C) (Oral)  Resp 16   Ht 5\' 11"  (1.803 m)   Wt 58.4 kg   SpO2 96%   BMI 17.96 kg/m  SpO2: SpO2: 96 % O2 Device: O2 Device: Room Air     Palliative Medicine Assessment & Plan   Assessment: Principal Problem:   Cardiac arrest ([place]) Active Problems:   AKI (acute kidney injury) ([place])   Chronic systolic CHF (congestive heart failure) (HCC)   Hypothyroidism   Septic shock (HCC)   Acute respiratory failure with hypoxia (HCC)   Protein-calorie malnutrition, severe   Hematochezia   VAP (ventilator-associated pneumonia) (HCC)   Delirium   Oropharyngeal dysphagia   Elevated liver enzymes   Acute urinary retention    Recommendations/Plan: Full code/full scope Allow time for outcomes PMT will continue to follow Outpatient palliative referral at discharge   Prognosis:  Unable to determine  Discharge Planning: SNF/rehab    Thank you for allowing the Palliative Medicine Team to assist in the care of this patient.   MDM - moderate   Lavena Bullion, NP   Please contact Palliative Medicine Team phone at [phone] for questions and concerns.  For individual providers, please see AMION.

## 2023-02-06 DIAGNOSIS — I469 Cardiac arrest, cause unspecified: Secondary | ICD-10-CM | POA: Diagnosis not present

## 2023-02-06 DIAGNOSIS — I5022 Chronic systolic (congestive) heart failure: Secondary | ICD-10-CM | POA: Diagnosis not present

## 2023-02-06 DIAGNOSIS — J9601 Acute respiratory failure with hypoxia: Secondary | ICD-10-CM | POA: Diagnosis not present

## 2023-02-06 DIAGNOSIS — R1312 Dysphagia, oropharyngeal phase: Secondary | ICD-10-CM | POA: Diagnosis not present

## 2023-02-06 LAB — GLUCOSE, CAPILLARY
Glucose-Capillary: 109 mg/dL — ABNORMAL HIGH (ref 70–99)
Glucose-Capillary: 107 mg/dL — ABNORMAL HIGH (ref 70–99)
Glucose-Capillary: 120 mg/dL — ABNORMAL HIGH (ref 70–99)
Glucose-Capillary: 131 mg/dL — ABNORMAL HIGH (ref 70–99)
Glucose-Capillary: 105 mg/dL — ABNORMAL HIGH (ref 70–99)

## 2023-02-06 MED ORDER — ENOXAPARIN SODIUM 40 MG/0.4ML IJ SOSY
40.0000 mg | PREFILLED_SYRINGE | Freq: Once | INTRAMUSCULAR | Status: AC
  Administered 2023-02-06: 40 mg via SUBCUTANEOUS
  Filled 2023-02-06: qty 0.4

## 2023-02-06 MED ORDER — DEXTROSE IN LACTATED RINGERS 5 % IV SOLN: INTRAVENOUS | Status: DC

## 2023-02-06 MED ORDER — CEFAZOLIN SODIUM-DEXTROSE 2-4 GM/100ML-% IV SOLN: 2.0000 g | INTRAVENOUS | Status: AC

## 2023-02-06 MED ORDER — ENOXAPARIN SODIUM 40 MG/0.4ML IJ SOSY: 40.0000 mg | PREFILLED_SYRINGE | INTRAMUSCULAR | Status: DC

## 2023-02-06 MED ORDER — ENOXAPARIN SODIUM 40 MG/0.4ML IJ SOSY
40.0000 mg | PREFILLED_SYRINGE | INTRAMUSCULAR | Status: DC
  Administered 2023-02-08 – 2023-02-10 (×3): 40 mg via SUBCUTANEOUS
  Filled 2023-02-06 (×3): qty 0.4

## 2023-02-06 NOTE — Assessment & Plan Note (Signed)
-  2/29 PEA arrest--> 9 min CPR 4 rounds epi. Unshockable rhythm.  -Rib fracture 2-7 ribs - Using lidocaine patch.  -Off pressors. Central line out. Weaned off vasopressors. -Continue amiodarone to suppress PVCs.

## 2023-02-06 NOTE — Assessment & Plan Note (Addendum)
Advance to dysphagia 2 diet by SLP but p.o. intake remains poor. Remains on continuous tube feed. -IR reconsulted.  G-tube scheduled for 3/26. Hold TF at [facility]. Lovenox on hold, will need to restart after PEG placement.

## 2023-02-06 NOTE — Progress Notes (Signed)
PROGRESS NOTE    Curtis Ramos  [id number] DOB: May 11, 1932 DOA: 01/13/2023 PCP: Leonides Sake, MD  Brief Narrative:  89 year old man who presented to [facility] ED 2/29 via EMS after unwitnessed fall at work. PMHx significant for HTN, HFrEF with NICM (cMRI 12/2022 with severe LV dilation, EF 17%, LBBB, followed by Dr. Haroldine Laws), AFib (on Eliquis), MAI infection, GERD, hypothyroidism, history of thyroid CA.   History obtained from chart review and from family. Patient presented to [facility] ED via EMS after a fall at work; per family, patient did not have any complaints prior to fall. Recently undergoing workup with Cardiology for pacemaker placement. He was found down between a radiator and the wall, unresponsive with contusion and deformities to R side of his head. Initially with tachycardic with EMS, then bradycardic. ST elevation noted on EKG and Code STEMI was called, later called off as ST elevation had resolved. Patient lost pulses while in ED and became apneic with cardiac monitoring showing PEA arrest. CPR administered x 9 minutes with Epi x 4 and ROSC, patient never had a shockable rhythm. Intubated peri-arrest. Cards was consulted, noting high risk of VT/VF in the setting of severe CM. AHF was also notified of admission.  Hospital Course: 89 year old M with PMH of systolic CHF, NICM, A-fib on Eliquis, HTN, hypothyroidism, thyroid cancer and GERD presented to ED on 2/29 after unwitnessed fall at work.  As per the PCCM notes patient was found down between the radiator and the wall unresponsive with contusion.  On arrival to ED patient became apneic and lost pulses and cardiac monitoring showed PEA arrest.  CPR administered for 9 minutes with epi x 4 and ROSC achieved.  Patient was intubated and admitted to ICU. cardiology was consulted. CT of the chest abdomen and pelvis showed known displaced transverse fractures of the mid lower sternal body, acute fracture of the coastal cartilage of bilateral  second through sixth ribs, acute fracture of the anterior second to 7 ribs.  He was started on Unasyn for aspiration pneumonitis, later transitioned to cefepime for Pseudomonas VAP.     Patient was extubated on 01/19/2023, and transferred to [facility] on 01/21/2023. On 3/9, he became tachypneic and requiried BIPAP for 24 hours, and transitioned to HF oxygen.  Patient was weaned off oxygen to room air on 3/12.  Remains on TF via cortrack due to significant risk for aspiration.  Hospital course significant for delirium that seems to have resolved.   After discussion with family, IR was consulted for G-tube.  However, SLP reevaluated patient and advanced diet to dysphagia-1.  Now family wants to hold on G-tube.  SLP advancing him further to dysphagia 2 diet.  Unfortunately, patient's p.o. intake is very poor.  After discussion with RD and patient's son, IR reconsulted for G-tube.   Final disposition SNF after G-tube which is tentatively planned for 02/08/2023.    Assessment and Plan: * Cardiac arrest ([place]) -2/29 PEA arrest--> 9 min CPR 4 rounds epi. Unshockable rhythm.  -Rib fracture 2-7 ribs - Using lidocaine patch.  -Off pressors. Central line out. Weaned off vasopressors. -Continue amiodarone to suppress PVCs.  Oropharyngeal dysphagia Advance to dysphagia 2 diet by SLP but p.o. intake remains poor. -Cortrak unclogged.  Dietitian started continuous tube feed. -IR reconsulted.  G-tube tentatively scheduled for 3/26.  Acute urinary retention Resolved. Has external condom catheter  Elevated liver enzymes Repeat CMP in AM. Likely due to sepsis.  Delirium Continues. Likely due to cardiac arrest.   VAP (ventilator-associated  pneumonia) ([place]) Resolved. Completing course of IV cefepime on 3/13   Hematochezia Resolved. Had been on Eliquis prior to admission for hx of PAF. Eliquis stopped. [facility] saw patient. Did not feel pt required EGD or colonoscopy. HgB stable 11 g/dl  Protein-calorie  malnutrition, severe See RD note 02-04-2023 Severe Malnutrition related to chronic illness (CHF) as evidenced by severe fat depletion, severe muscle depletion.   Acute respiratory failure with hypoxia (HCC) Resolved. On RA Multifactorial-PEA arrest, sternal and rib fractures and aspiration pneumonia. -Extubated on 3/6.  Currently on room air. -Pulmonary toilet and aspiration precaution  Septic shock ([place]) Resolved. Transferred out of ICU on 01-21-2023  Hypothyroidism On synthroid 175 mcg daily. TSH 29.48 on 01-13-2023. Will recheck TSH on 02-11-2023.  Chronic systolic CHF (congestive heart failure) (HCC) Stable. Echo 01-2023 shows LVEF of 20%   AKI (acute kidney injury) ([place]) resolved   DVT prophylaxis: SQ Heparin   Code Status: Full Code Family Communication: attempted to call pt's son davie Coykendall. No answer on [phone] Disposition Plan: to SNF after PEG placed and stable on TTF  Consultants:  PCCM PMR GI Palliative Care Advanced Heart Failure Cardiology Trauma  Procedures:  Intubation 01-13-2023  Antimicrobials:  Cefepime completed on 01-26-2023    Subjective: Stable overnight. Continues with Cortrak. Attempted to call pt's son Lakeland Luskey @ [phone]. No answer. PEG via IR ordered from tomorrow. Will hold TF tonight at [facility].   Objective: Vitals:   02/05/23 2008 02/06/23 0405 02/06/23 0500 02/06/23 0803  BP: 113/60 115/70  125/65  Pulse: 81 90  84  Resp: 16 16  18   Temp: (!) 97.5 F (36.4 C) 98.4 F (36.9 C)  98 F (36.7 C)  TempSrc: Oral Oral  Oral  SpO2: 96% (!) 86%  100%  Weight:   58.6 kg   Height:        Intake/Output Summary (Last 24 hours) at 02/06/2023 0857 Last data filed at 02/06/2023 0804 Gross per 24 hour  Intake 918 ml  Output 2300 ml  Net -1382 ml   Filed Weights   02/04/23 0326 02/05/23 0456 02/06/23 0500  Weight: 56.4 kg 58.4 kg 58.6 kg    Examination:  Physical Exam Vitals reviewed.  Constitutional:      Appearance: He is  not toxic-appearing.     Comments: Awake but disoriented Chronically ill appearing  HENT:     Head: Normocephalic and atraumatic.     Nose:     Comments: Cortrak in nare Cardiovascular:     Rate and Rhythm: Normal rate and regular rhythm.     Pulses: Normal pulses.  Pulmonary:     Effort: Pulmonary effort is normal.     Breath sounds: Normal breath sounds.  Abdominal:     General: Abdomen is flat. Bowel sounds are normal.  Skin:    General: Skin is warm and dry.     Capillary Refill: Capillary refill takes less than 2 seconds.  Neurological:     Mental Status: He is disoriented.     Data Reviewed: I have personally reviewed following labs and imaging studies  CBC: Recent Labs  Lab 02/03/23 0536 02/04/23 1307 02/05/23 0117  WBC 13.0* 13.1* 13.3*  HGB 10.9* 11.3* 11.0*  HCT 32.7* 34.1* 32.0*  MCV 110.8* 111.8* 113.9*  PLT 419* 374 [id number]   Basic Metabolic Panel: Recent Labs  Lab 02/03/23 0536 02/03/23 0537 02/05/23 0117  NA  --  138 137  K  --  4.2 4.3  CL  --  107 108  CO2  --  24 23  GLUCOSE  --  98 107*  BUN  --  33* 37*  CREATININE  --  1.10 1.09  CALCIUM  --  10.3 10.0  MG 2.2  --  1.9  PHOS  --  2.9 2.4*   GFR: Estimated Creatinine Clearance: 38.8 mL/min (by C-G formula based on SCr of 1.09 mg/dL). Liver Function Tests: Recent Labs  Lab 02/03/23 0537 02/05/23 0117  ALBUMIN 2.7* 2.6*    BNP (last 3 results) Recent Labs    06/18/22 1331 10/05/22 0848  PROBNP 1,795* 3,434*   CBG: Recent Labs  Lab 02/05/23 1149 02/05/23 1604 02/05/23 2344 02/06/23 0406 02/06/23 0756  GLUCAP 143* 113* 115* 109* 107*    No results found for this or any previous visit (from the past 240 hour(s)).   Radiology Studies: No results found.   Scheduled Meds:  amiodarone  200 mg Oral Daily   [START ON 02/07/2023] enoxaparin (LOVENOX) injection  40 mg Subcutaneous Q24H   feeding supplement  237 mL Oral TID BM   feeding supplement (PROSource TF20)  60 mL Per  Tube Daily   free water  200 mL Per Tube Q4H   levothyroxine  175 mcg Oral Q0600   lidocaine  1 patch Transdermal Q24H   losartan  12.5 mg Oral Daily   mirtazapine  7.5 mg Oral QHS   mouth rinse  15 mL Mouth Rinse 4 times per day   pantoprazole (PROTONIX) IV  40 mg Intravenous Q12H   spironolactone  12.5 mg Oral Daily   Continuous Infusions:  sodium chloride Stopped (01/25/23 1632)   [START ON 02/07/2023] dextrose 5% lactated ringers     feeding supplement (OSMOLITE 1.5 CAL) 60 mL/hr at 02/05/23 1700     LOS: 24 days    Time spent: 35 minutes    Kristopher Oppenheim, DO  [facility]  02/06/2023, 8:57 AM

## 2023-02-06 NOTE — Assessment & Plan Note (Signed)
See RD note 02-04-2023 Severe Malnutrition related to chronic illness (CHF) as evidenced by severe fat depletion, severe muscle depletion.

## 2023-02-06 NOTE — Assessment & Plan Note (Signed)
Resolved. On RA Multifactorial-PEA arrest, sternal and rib fractures and aspiration pneumonia. -Extubated on 3/6.  Currently on room air. -Pulmonary toilet and aspiration precaution

## 2023-02-06 NOTE — Assessment & Plan Note (Signed)
Resolved. Transferred out of ICU on 01-21-2023

## 2023-02-06 NOTE — Assessment & Plan Note (Signed)
Stable. Echo 01-2023 shows LVEF of 20%

## 2023-02-06 NOTE — Assessment & Plan Note (Signed)
resolved 

## 2023-02-06 NOTE — Assessment & Plan Note (Signed)
Resolved. Completing course of IV cefepime on 3/13

## 2023-02-06 NOTE — Assessment & Plan Note (Signed)
Resolved. Has external condom catheter

## 2023-02-06 NOTE — Hospital Course (Signed)
89 year old M with PMH of systolic CHF, NICM, A-fib on Eliquis, HTN, hypothyroidism, thyroid cancer and GERD presented to ED on 2/29 after unwitnessed fall at work.  As per the PCCM notes patient was found down between the radiator and the wall unresponsive with contusion.  On arrival to ED patient became apneic and lost pulses and cardiac monitoring showed PEA arrest.  CPR administered for 9 minutes with epi x 4 and ROSC achieved.  Patient was intubated and admitted to ICU. cardiology was consulted. CT of the chest abdomen and pelvis showed known displaced transverse fractures of the mid lower sternal body, acute fracture of the coastal cartilage of bilateral second through sixth ribs, acute fracture of the anterior second to 7 ribs.  He was started on Unasyn for aspiration pneumonitis, later transitioned to cefepime for Pseudomonas VAP.     Patient was extubated on 01/19/2023, and transferred to [facility] on 01/21/2023. On 3/9, he became tachypneic and requiried BIPAP for 24 hours, and transitioned to HF oxygen.  Patient was weaned off oxygen to room air on 3/12.  Remains on TF via cortrack due to significant risk for aspiration.  Hospital course significant for delirium that seems to have resolved.   After discussion with family, IR was consulted for G-tube.  However, SLP reevaluated patient and advanced diet to dysphagia-1.  Now family wants to hold on G-tube.  SLP advancing him further to dysphagia 2 diet.  Unfortunately, patient's p.o. intake is very poor.  After discussion with RD and patient's son, IR reconsulted for G-tube.   Final disposition SNF after G-tube which is tentatively planned for 02/08/2023.

## 2023-02-06 NOTE — Assessment & Plan Note (Signed)
Repeat CMP in AM. Likely due to sepsis.

## 2023-02-06 NOTE — Plan of Care (Signed)

## 2023-02-06 NOTE — Assessment & Plan Note (Addendum)
Resolved. Had been on Eliquis prior to admission for hx of PAF. Eliquis stopped. [facility] saw patient. Did not feel pt required EGD or colonoscopy. HgB stable 11 g/dl

## 2023-02-06 NOTE — Assessment & Plan Note (Signed)
On synthroid 175 mcg daily. TSH 29.48 on 01-13-2023. Will recheck TSH on 02-11-2023.

## 2023-02-06 NOTE — Assessment & Plan Note (Signed)
Improved today. Pt knows his birthdate. That it is March 2022 and that [place] is the next holiday.  Likely due to cardiac arrest.

## 2023-02-06 NOTE — Subjective & Objective (Addendum)
Stable overnight. Continues with Cortrak. On Schedule for PEG by IR tomorrow.  calle pt's son Devang Seccombe @ [id number]. Discussed with him that DC from hospital would be towards the middle/end of this week if SNF bed located and insurance is authorized.  PEG via IR ordered from tomorrow. Will hold TF tonight at [place].

## 2023-02-06 NOTE — Consult Note (Signed)
Chief Complaint: Patient was seen in consultation today for dysphagia-percutaneous gastric tube placement at the request of Dr Bettina Gavia   Supervising Physician: Markus Daft  Patient Status: [facility] - In-pt  History of Present Illness: Curtis Ramos is a 89 y.o. male  HTN; CHF/NICM; Afib Thyroid Ca Unwitnessed fall- found down-- unresponsive To ED via EMS  PEA ; CPR 9 min; Epi x 4 Sternal body fx; 2-6 ribs fx Aspiration PNA  Extubated 01/19/23 Cortrak per aspiration risk Malnutrition and dysphagia Planned for SNF asap  Request for percutaneous gastric tube placement in IR Approved anatomy with IR Rad Planned for likely 3/26 Family is aware and agreeable  Past Medical History:  Diagnosis Date   Acute on chronic combined systolic and diastolic CHF (congestive heart failure) ([place]) 03/19/2019   AKI (acute kidney injury) ([place]) 12/26/2014   Arthralgia 11/04/2014   Arthritis    "proabably"   Atypical mycobacterium infection    Cancer [facility])    family unaware of this hx on [id number]   Chronic systolic heart failure (HCC)    Decreased oral intake 12/26/2014   Decreased oral intake 12/26/2014   Depressed left ventricular ejection fraction 02/05/2020   Dilated cardiomyopathy ([place]) 03/19/2019   Ejection fraction 20 to 25% based on echocardiogram done by pulmonologist month ago.   DJD (degenerative joint disease) 10/07/2014   Dyspnea on exertion 03/19/2019   Essential hypertension 02/05/2020   GERD (gastroesophageal reflux disease)    Hammer toes of both feet 01/30/2018   History of thyroid cancer 12/16/2016   Hypercalcemia 12/26/2014   Hypothyroidism    Hypothyroidism associated with surgical procedure 10/07/2014   Lung mass    Myalgia 11/04/2014   Mycobacterium avium complex ([place])    Nail dystrophy 01/30/2018   Occult malignancy (HCC)    PAF (paroxysmal atrial fibrillation) ([place]) 02/05/2020   Persistent atrial fibrillation ([place]) 03/19/2019   Chads 2 Vascor equals 3   PONV (postoperative  nausea and vomiting)    Pre-ulcerative calluses 01/30/2018   Pseudoaneurysm following procedure ([place]) 04/16/2020   Sensorineural hearing loss (SNHL), bilateral 08/11/2018   Shingles    Toenail fungus 01/30/2018   Wears glasses     Past Surgical History:  Procedure Laterality Date   APPENDECTOMY     CATARACT EXTRACTION W/ INTRAOCULAR LENS  IMPLANT, BILATERAL Bilateral    COLONOSCOPY     INGUINAL HERNIA REPAIR Left    LEFT HEART CATH AND CORONARY ANGIOGRAPHY N/A 03/11/2020   Procedure: LEFT HEART CATH AND CORONARY ANGIOGRAPHY;  Surgeon: Jettie Booze, MD;  Location: [place] [facility];  Service: Cardiovascular;  Laterality: N/A;   MASS EXCISION Right 08/27/2014   Procedure: EXCISION MASS RIGHT PALM/INDEX;  Surgeon: Leanora Cover, MD;  Location: [place];  Service: Orthopedics;  Laterality: Right;   THYROIDECTOMY  2003    Allergies: Patient has no known allergies.  Medications: Prior to Admission medications   Medication Sig Start Date End Date Taking? Authorizing Provider  amiodarone (PACERONE) 200 MG tablet Take 200 mg by mouth daily.   Yes [provider]  carvedilol (COREG) 6.25 MG tablet Take 1 tablet (6.25 mg total) by mouth 2 (two) times daily. 12/31/22  Yes Bensimhon, Shaune Pascal, MD  dapagliflozin propanediol (FARXIGA) 10 MG TABS tablet Take 1 tablet (10 mg total) by mouth daily before breakfast. 10/18/22  Yes Park Liter, MD  DOCUSATE SODIUM PO Take 1 tablet by mouth daily.   Yes [provider]  ELIQUIS 5 MG TABS  tablet TAKE 1 TABLET BY MOUTH TWICE A DAY Patient taking differently: Take 5 mg by mouth 2 (two) times daily. 12/03/22  Yes Park Liter, MD  furosemide (LASIX) 20 MG tablet Take 4 tablets (80 mg total) by mouth daily. 12/07/22  Yes Bensimhon, Shaune Pascal, MD  Multiple Vitamins-Minerals (PRESERVISION AREDS PO) Take 1 tablet by mouth daily.   Yes [provider]  polyethylene glycol (MIRALAX / GLYCOLAX) 17 g packet  Take 17 g by mouth daily.   Yes [provider]  sacubitril-valsartan (ENTRESTO) 49-51 MG Take 1 tablet by mouth 2 (two) times daily. 11/18/22  Yes Park Liter, MD  SYNTHROID 175 MCG tablet Take 175 mcg by mouth daily. 05/16/22  Yes [provider]     History reviewed. No pertinent family history.  Social History   Socioeconomic History   Marital status: Married    Spouse name: Not on file   Number of children: Not on file   Years of education: Not on file   Highest education level: Not on file  Occupational History   Not on file  Tobacco Use   Smoking status: Never   Smokeless tobacco: Never  Substance and Sexual Activity   Alcohol use: No   Drug use: No   Sexual activity: Not on file  Other Topics Concern   Not on file  Social History Narrative   Not on file   Social Determinants of Health   Financial Resource Strain: Low Risk  (01/18/2023)   Overall Financial Resource Strain (CARDIA)    Difficulty of Paying Living Expenses: Not hard at all  Food Insecurity: No Food Insecurity (01/18/2023)   Hunger Vital Sign    Worried About Running Out of Food in the Last Year: Never true    Ran Out of Food in the Last Year: Never true  Transportation Needs: No Transportation Needs (01/18/2023)   PRAPARE - Hydrologist (Medical): No    Lack of Transportation (Non-Medical): No  Physical Activity: Not on file  Stress: Not on file  Social Connections: Not on file    Review of Systems: A 12 point ROS discussed and pertinent positives are indicated in the HPI above.  All other systems are negative.  Vital Signs: BP 125/65 (BP Location: Left Arm)   Pulse 84   Temp 98 F (36.7 C) (Oral)   Resp 18   Ht 5\' 11"  (1.803 m)   Wt 129 lb 3 oz (58.6 kg)   SpO2 100%   BMI 18.02 kg/m    Physical Exam Vitals reviewed.  HENT:     Mouth/Throat:     Mouth: Mucous membranes are moist.  Cardiovascular:     Rate and Rhythm: Normal rate and  regular rhythm.  Pulmonary:     Effort: Pulmonary effort is normal.     Breath sounds: No wheezing.  Abdominal:     Tenderness: There is no abdominal tenderness.  Musculoskeletal:        General: Normal range of motion.  Skin:    General: Skin is warm.  Neurological:     Mental Status: He is alert and oriented to person, place, and time.     Comments: HOH--- but able to answer all questions appropriately   Psychiatric:        Behavior: Behavior normal.     Comments: Pt asked me to be sure to call son David/POA to explain procedure and agreeable consent     Imaging:  CT ABDOMEN WO CONTRAST  Result Date: 02/04/2023 CLINICAL DATA:  89 year old male referred for possible gastrostomy EXAM: CT ABDOMEN WITHOUT CONTRAST TECHNIQUE: Multidetector CT imaging of the abdomen was performed following the standard protocol without IV contrast. RADIATION DOSE REDUCTION: This exam was performed according to the departmental dose-optimization program which includes automated exposure control, adjustment of the mA and/or kV according to patient size and/or use of iterative reconstruction technique. COMPARISON:  01/13/2023 FINDINGS: Lower chest: Linear opacities at the left greater than right lung bases, in the region of previous multifocal infection. Interval improvement in the mixed ground-glass and nodular opacities at the lung bases with some mild residual centrilobular opacities in the left lower lobe. No pleural effusion. Hepatobiliary: Unremarkable liver.  Unremarkable gallbladder. Pancreas: Unremarkable Spleen: Unremarkable Adrenals/Urinary Tract: Unremarkable visualized adrenal glands. No hydronephrosis or nephrolithiasis. Stomach/Bowel: Weighted tip enteric feeding tube crosses the stomach and terminates in the proximal duodenum. Stomach is decompressed. Unremarkable visualized small bowel. Some retained enteric contrast within the visualized colon. The splenic flexure overlies the fundus of the stomach,  with the transverse colon maintained in a usual anatomic arrangement with the greater curvature of the stomach. Vascular/Lymphatic: Atherosclerotic changes of the abdominal aorta. No adenopathy. Other: None Musculoskeletal: Degenerative changes of the spine. IMPRESSION: Near complete resolution of the previous multifocal infection at the lung bases with some residual scarring/atelectasis. Mild residual centrilobular nodularity in the left lower lobe compatible with resolving infection. No acute finding of the visualized abdomen. Aortic Atherosclerosis (ICD10-I70.0). Electronically Signed   By: Corrie Mckusick D.O.   On: 02/04/2023 10:49   DG Swallowing Func-Speech Pathology  Result Date: 01/31/2023 Table formatting from the original result was not included. Modified Barium Swallow Study Patient Details Name: Curtis Ramos MRN: [id number] Date of Birth: 1932/07/26 Today's Date: 01/31/2023 HPI/PMH: HPI: 89 year old man who [facility] ED [facility] via EMS after unwitnessed fall at work, patient did not have any complaints prior to fall. He was found down between a radiator and the wall, unresponsive with contusion and deformities to R side of his head. Initially with tachycardic with EMS, then bradycardic. ST elevation noted on EKG and Code STEMI was called, later called off as ST elevation had resolved. Patient lost pulses while in ED and became apneic with cardiac monitoring showing PEA arrest. CPR administered x 9 minutes with Epi x 4 and ROSC, patient never had a shockable rhythm. Intubated peri-arrest 2/28-3/3 then reintubated until 3/6.Marland KitchenPMHx significant for HTN, HFrEF with NICM (cMRI 12/2022 with severe LV dilation, EF 17%, LBBB, followed by Dr. Haroldine Laws), AFib (on Eliquis), MAI infection, GERD, hypothyroidism, history of thyroid CA. Clinical Impression: Clinical Impression: Pt is starting to show progress since initial MBS, in terms of his pharyngeal physiology as well as his overall mentation and respiratory  status. Although he still has reduced base of tongue retraction, anterior hyoid movement, and pharyngeal peristalsis, he is starting to show improvement in these areas as well as his epiglottic inversion. This results in less pharyngeal residue and less invasion into the laryngeal vestibule. Silent aspiration occurred after the swallow with thin liquids and he had difficulty clearing his airway once aspiration had occurred. With other consistencies tested he had more of a tendency to penetrate in very small quantities, often reaching the vocal folds without attempts to spontaneously clear. When cued to cough he is able to expel penetrates further airway from his vocal folds and sometimes even out of his laryngeal vestibule, but he often needs to be prompted to cough  harder. A cough followed by a second swallowed helped to reduce some pharyngeal residue and reduced instances in which barium fell back into the laryngeal vestibule. Given reports of baseline dysphagia, suspect that there will always inherently be some risk of aspiration. Discussed options with pt's son, [name], and his wife, and they prefer to allow pt to start a modified diet with careful assistance for use of strategies to try to mitigate risk as much as possible. They would prefer to avoid a longer-term feeding tube if possible, acknowledging risks of that but also the comfort and joy pt gets from PO intake. MD is also in agreement with starting PO diet with precautions. SLP will continue to follow. Factors that may increase risk of adverse event in presence of aspiration ([place] 2021): Factors that may increase risk of adverse event in presence of aspiration ([place] 2021): Poor general health and/or compromised immunity; Frail or deconditioned; Weak cough; Presence of tubes (ETT, trach, NG, etc.) Recommendations/Plan: Swallowing Evaluation Recommendations Swallowing Evaluation Recommendations Recommendations: PO diet PO Diet  Recommendation: Dysphagia 1 (Pureed); Mildly thick liquids (Level 2, nectar thick) Liquid Administration via: Cup; Straw Medication Administration: Crushed with puree Supervision: Staff to assist with self-feeding; Full supervision/cueing for swallowing strategies Swallowing strategies  : Minimize environmental distractions; Slow rate; Small bites/sips; Hard cough after swallowing; Multiple dry swallows after each bite/sip (cough, then swallow) Postural changes: Position pt fully upright for meals Oral care recommendations: Oral care BID (2x/day) Treatment Plan Treatment Plan Treatment recommendations: Therapy as outlined in treatment plan below Follow-up recommendations: Acute inpatient rehab (3 hours/day) Functional status assessment: Patient has had a recent decline in their functional status and demonstrates the ability to make significant improvements in function in a reasonable and predictable amount of time. Treatment frequency: Min 2x/week Treatment duration: 2 weeks Interventions: Aspiration precaution training; Compensatory techniques; Patient/family education; Trials of upgraded texture/liquids; Diet toleration management by SLP; Respiratory muscle strength training Recommendations Recommendations for follow up therapy are one component of a multi-disciplinary discharge planning process, led by the attending physician.  Recommendations may be updated based on patient status, additional functional criteria and insurance authorization. Assessment: Orofacial Exam: Orofacial Exam Oral Cavity - Dentition: Adequate natural dentition Anatomy: Anatomy: Other (Comment) (often in a natural head tilt position) Thin Liquids: Thin Liquids (Level 0) Thin Liquids : Impaired Bolus delivery method: Spoon; Cup; Straw Thin Liquid - Impairment: Pharyngeal impairment Initiation of swallow : Valleculae Soft palate elevation: No bolus between soft palate (SP)/pharyngeal wall (PW) Laryngeal elevation: Complete superior movement  of thyroid cartilage with complete approximation of arytenoids to epiglottic petiole Anterior hyoid excursion: Partial Epiglottic movement: Partial Laryngeal vestibule closure: Incomplete, narrow column air/contrast in laryngeal vestibule Pharyngeal stripping wave : Present - diminished Pharyngeal contraction (A/P view only): N/A Pharyngoesophageal segment opening: Partial distention/partial duration, partial obstruction of flow Tongue base retraction: Narrow column of contrast or air between tongue base and PPW Pharyngeal residue: Collection of residue within or on pharyngeal structures Location of pharyngeal residue: Tongue base; Valleculae; Pyriform sinuses Penetration/Aspiration Scale (PAS) score: 7.  Material enters airway, passes BELOW cords and not ejected out despite cough attempt by patient; 8.  Material enters airway, passes BELOW cords without attempt by patient to eject out (silent aspiration)  Mildly Thick Liquids: Mildly thick liquids (Level 2, nectar thick) Mildly thick liquids (Level 2, nectar thick): Impaired Bolus delivery method: Spoon; Cup; Straw Mildly Thick Liquid - Impairment: Pharyngeal impairment Initiation of swallow : Valleculae Soft palate elevation: No bolus  between soft palate (SP)/pharyngeal wall (PW) Laryngeal elevation: Complete superior movement of thyroid cartilage with complete approximation of arytenoids to epiglottic petiole Anterior hyoid excursion: Partial Epiglottic movement: Complete Laryngeal vestibule closure: Incomplete, narrow column air/contrast in laryngeal vestibule Pharyngeal stripping wave : Present - diminished Pharyngeal contraction (A/P view only): N/A Pharyngoesophageal segment opening: Partial distention/partial duration, partial obstruction of flow Tongue base retraction: Trace column of contrast or air between tongue base and PPW Pharyngeal residue: Collection of residue within or on pharyngeal structures Location of pharyngeal residue: Valleculae;  Aryepiglottic folds; Pyriform sinuses Penetration/Aspiration Scale (PAS) score: 5.  Material enters airway, CONTACTS cords and not ejected out  Moderately Thick Liquids: Moderately thick liquids (Level 3, honey thick) Moderately thick liquids (Level 3, honey thick): Impaired Bolus delivery method: Spoon; Cup Moderately Thick Liquid - Impairment: Pharyngeal impairment Tongue control during bolus hold: Escape to lateral buccal cavity/floor of mouth Bolus transport/lingual motion: Delayed initiation of tongue motion (oral holding) Oral residue: Trace residue lining oral structures Location of oral residue : Tongue Initiation of swallow : Valleculae Soft palate elevation: No bolus between soft palate (SP)/pharyngeal wall (PW) Laryngeal elevation: Complete superior movement of thyroid cartilage with complete approximation of arytenoids to epiglottic petiole Anterior hyoid excursion: Partial Epiglottic movement: Complete Laryngeal vestibule closure: Incomplete, narrow column air/contrast in laryngeal vestibule Pharyngeal stripping wave : Present - diminished Pharyngeal contraction (A/P view only): N/A Pharyngoesophageal segment opening: Partial distention/partial duration, partial obstruction of flow Tongue base retraction: Trace column of contrast or air between tongue base and PPW Pharyngeal residue: Collection of residue within or on pharyngeal structures Location of pharyngeal residue: Tongue base; Valleculae; Aryepiglottic folds Penetration/Aspiration Scale (PAS) score: 5.  Material enters airway, CONTACTS cords and not ejected out  Puree: Puree Puree: Impaired Puree - Impairment: Pharyngeal impairment Initiation of swallow: Valleculae Soft palate elevation: No bolus between soft palate (SP)/pharyngeal wall (PW) Laryngeal elevation: Complete superior movement of thyroid cartilage with complete approximation of arytenoids to epiglottic petiole Anterior hyoid excursion: Partial Epiglottic movement: Complete Laryngeal  vestibule closure: Incomplete, narrow column air/contrast in laryngeal vestibule Pharyngeal stripping wave : Present - diminished Pharyngeal contraction (A/P view only): N/A Pharyngoesophageal segment opening: Partial distention/partial duration, partial obstruction of flow Tongue base retraction: Trace column of contrast or air between tongue base and PPW Pharyngeal residue: Collection of residue within or on pharyngeal structures Location of pharyngeal residue: Valleculae Penetration/Aspiration Scale (PAS) score: 3.  Material enters airway, remains ABOVE vocal cords and not ejected out Solid: Solid Solid: Impaired Solid - Impairment: Oral Impairment; Pharyngeal impairment Lip Closure: -- (not able to fully visualize) Bolus preparation/mastication: Slow prolonged chewing/mashing with complete recollection Bolus transport/lingual motion: Slow tongue motion Oral residue: Trace residue lining oral structures Location of oral residue : Tongue; Palate Initiation of swallow: Valleculae Soft palate elevation: No bolus between soft palate (SP)/pharyngeal wall (PW) Laryngeal elevation: Complete superior movement of thyroid cartilage with complete approximation of arytenoids to epiglottic petiole Anterior hyoid excursion: Partial Epiglottic movement: Complete Laryngeal vestibule closure: Incomplete, narrow column air/contrast in laryngeal vestibule Pharyngeal stripping wave : Present - diminished Pharyngeal contraction (A/P view only): N/A Pharyngoesophageal segment opening: Partial distention/partial duration, partial obstruction of flow Tongue base retraction: Trace column of contrast or air between tongue base and PPW Pharyngeal residue: Collection of residue within or on pharyngeal structures Location of pharyngeal residue: Valleculae Penetration/Aspiration Scale (PAS) score: 5.  Material enters airway, CONTACTS cords and not ejected out Pill: Pill Pill: Not Tested Compensatory Strategies: Compensatory Strategies  Compensatory strategies: Yes  Straw: Effective; Ineffective Effective Straw: Mildly thick liquid (Level 2, nectar thick) Ineffective Straw: Thin liquid (Level 0) Effortful swallow: Effective Effective Effortful Swallow: Mildly thick liquid (Level 2, nectar thick); Puree Ineffective Effortful Swallow: Mildly thick liquid (Level 2, nectar thick); Moderately thick liquid (Level 3, honey thick) Multiple swallows: Effective Effective Multiple Swallows: Mildly thick liquid (Level 2, nectar thick); Puree Ineffective Multiple Swallows: Mildly thick liquid (Level 2, nectar thick); Moderately thick liquid (Level 3, honey thick); Puree Chin tuck: -- (pt naturally in a chin tuck position for a lot of the study) Ineffective Chin Tuck: Mildly thick liquid (Level 2, nectar thick); Moderately thick liquid (Level 3, honey thick); Thin liquid (Level 0); Puree   General Information: Caregiver present: No  Diet Prior to this Study: NPO; Cortrak/Small bore NG tube   Temperature : Normal   Respiratory Status: WFL   Supplemental O2: None (Room air)   History of Recent Intubation: Yes  Behavior/Cognition: Alert; Cooperative; Pleasant mood Self-Feeding Abilities: Needs assist with self-feeding Baseline vocal quality/speech: Normal Volitional Cough: Able to elicit Volitional Swallow: Able to elicit Exam Limitations: Fatigue (pt said he was feeling "worn out") Goal Planning: Prognosis for improved oropharyngeal function: Good Barriers to Reach Goals: Cognitive deficits No data recorded Patient/Family Stated Goal: pt and family would like to avoid a PEG if possible Consulted and agree with results and recommendations: Patient; Family member/caregiver; Physician; Nurse Pain: Pain Assessment Pain Assessment: Faces Pain Score: 2 Faces Pain Scale: 0 Breathing: 0 Negative Vocalization: 0 Facial Expression: 0 Body Language: 0 Consolability: 0 PAINAD Score: 0 Facial Expression: 0 Body Movements: 0 Muscle Tension: 0 Compliance with ventilator  (intubated pts.): N/A Vocalization (extubated pts.): 0 CPOT Total: 0 Pain Location: ribs with transitional movements Pain Descriptors / Indicators: Grimacing; Guarding Pain Intervention(s): Monitored during session End of Session: Start Time:SLP Start Time (ACUTE ONLY): [id number] Stop Time: SLP Stop Time (ACUTE ONLY): 1324 Time Calculation:SLP Time Calculation (min) (ACUTE ONLY): 19 min Charges: SLP Evaluations $ SLP Speech Visit: 1 Visit SLP Evaluations $BSS Swallow: 1 Procedure $MBS Swallow: 1 Procedure $Swallowing Treatment: 1 Procedure SLP visit diagnosis: SLP Visit Diagnosis: Dysphagia, oropharyngeal phase (R13.12) Past Medical History: Past Medical History: Diagnosis Date  Acute on chronic combined systolic and diastolic CHF (congestive heart failure) ([place]) 03/19/2019  AKI (acute kidney injury) ([place]) 12/26/2014  Arthralgia 11/04/2014  Arthritis   "proabably"  Atypical mycobacterium infection   Cancer ([place])   family unaware of this hx on [id number]  Chronic systolic heart failure (HCC)   Decreased oral intake 12/26/2014  Decreased oral intake 12/26/2014  Depressed left ventricular ejection fraction 02/05/2020  Dilated cardiomyopathy ([place]) 03/19/2019  Ejection fraction 20 to 25% based on echocardiogram done by pulmonologist month ago.  DJD (degenerative joint disease) 10/07/2014  Dyspnea on exertion 03/19/2019  Essential hypertension 02/05/2020  GERD (gastroesophageal reflux disease)   Hammer toes of both feet 01/30/2018  History of thyroid cancer 12/16/2016  Hypercalcemia 12/26/2014  Hypothyroidism   Hypothyroidism associated with surgical procedure 10/07/2014  Lung mass   Myalgia 11/04/2014  Mycobacterium avium complex ([place])   Nail dystrophy 01/30/2018  Occult malignancy (HCC)   PAF (paroxysmal atrial fibrillation) ([place]) 02/05/2020  Persistent atrial fibrillation ([place]) 03/19/2019  Chads 2 Vascor equals 3  PONV (postoperative nausea and vomiting)   Pre-ulcerative calluses 01/30/2018  Pseudoaneurysm following procedure ([place]) 04/16/2020   Sensorineural hearing loss (SNHL), bilateral 08/11/2018  Shingles   Toenail fungus 01/30/2018  Wears glasses  Past Surgical History: Past Surgical History: Procedure Laterality Date  APPENDECTOMY    CATARACT EXTRACTION W/ INTRAOCULAR LENS  IMPLANT, BILATERAL Bilateral   COLONOSCOPY    INGUINAL HERNIA REPAIR Left   LEFT HEART CATH AND CORONARY ANGIOGRAPHY N/A 03/11/2020  Procedure: LEFT HEART CATH AND CORONARY ANGIOGRAPHY;  Surgeon: Jettie Booze, MD;  Location: [place] [facility];  Service: Cardiovascular;  Laterality: N/A;  MASS EXCISION Right 08/27/2014  Procedure: EXCISION MASS RIGHT PALM/INDEX;  Surgeon: Leanora Cover, MD;  Location: [place] [place];  Service: Orthopedics;  Laterality: Right;  THYROIDECTOMY  2003 Osie Bond., M.A. CCC-SLP Acute Rehabilitation Services Office [phone] Secure chat preferred 01/31/2023, 5:33 PM  DG Swallowing Func-Speech Pathology  Result Date: 01/31/2023 Table formatting from the original result was not included. Modified Barium Swallow Study Patient Details Name: Curtis Ramos MRN: [id number] Date of Birth: 08/29/1932 Today's Date: 01/31/2023 HPI/PMH: HPI: 89 year old man who presented to [facility] ED 2/29 via EMS after unwitnessed fall at work, patient did not have any complaints prior to fall. He was found down between a radiator and the wall, unresponsive with contusion and deformities to R side of his head. Initially with tachycardic with EMS, then bradycardic. ST elevation noted on EKG and Code STEMI was called, later called off as ST elevation had resolved. Patient lost pulses while in ED and became apneic with cardiac monitoring showing PEA arrest. CPR administered x 9 minutes with Epi x 4 and ROSC, patient never had a shockable rhythm. Intubated peri-arrest 2/28-3/3 then reintubated until 3/6.Marland KitchenPMHx significant for HTN, HFrEF with NICM (cMRI 12/2022 with severe LV dilation, EF 17%, LBBB, followed by Dr. Haroldine Laws), AFib (on Eliquis), MAI infection, GERD,  hypothyroidism, history of thyroid CA. Clinical Impression: Clinical Impression: Pt demonstrates decreased strength for airway protection and coughing. Pt likely has a mild baseline dysphagia per his son's report. Currently pt has mild to moderate pharyngeal residue with most textures due to decreased base of tongue retraction, epiglottic deflection and pharyngeal peristalsis. There is additionally incomplete laryngeal clsoure and repeated aspiration during and after the swallow. Pt senses pentrate and aspirate and tries to clear, but efforts are weak and likely impacted by the effects of prolonged CPR and deconditioning. Pt is at risk of aspiration of all textures at this time. Recommend ptremain NPO but be offered ice and SLP will start RMT to improve efficacy of cough. Factors that may increase risk of adverse event in presence of aspiration ([place] 2021): Factors that may increase risk of adverse event in presence of aspiration ([place] 2021): Weak cough; Presence of tubes (ETT, trach, NG, etc.); Frail or deconditioned Recommendations/Plan: Swallowing Evaluation Recommendations Swallowing Evaluation Recommendations Recommendations: Ice chips PRN after oral care Oral care recommendations: Oral care QID (4x/day) Treatment Plan Treatment Plan Treatment recommendations: Therapy as outlined in treatment plan below Treatment frequency: Min 2x/week Treatment duration: 2 weeks Recommendations Recommendations for follow up therapy are one component of a multi-disciplinary discharge planning process, led by the attending physician.  Recommendations may be updated based on patient status, additional functional criteria and insurance authorization. Assessment: Orofacial Exam: Orofacial Exam Oral Cavity - Dentition: Adequate natural dentition Anatomy: No data recorded Thin Liquids: Thin Liquids (Level 0) Thin Liquids : Impaired Bolus delivery method: Spoon Thin Liquid - Impairment: Pharyngeal impairment  Initiation of swallow : Valleculae Soft palate elevation: No bolus between soft palate (SP)/pharyngeal wall (PW) Laryngeal elevation: Complete superior movement of thyroid cartilage with complete approximation of arytenoids to epiglottic petiole Anterior hyoid excursion: Partial Epiglottic movement: No inversion Laryngeal vestibule closure:  Incomplete, narrow column air/contrast in laryngeal vestibule Pharyngeal stripping wave : Present - diminished Pharyngeal contraction (A/P view only): N/A Pharyngoesophageal segment opening: Partial distention/partial duration, partial obstruction of flow Tongue base retraction: Narrow column of contrast or air between tongue base and PPW Pharyngeal residue: Collection of residue within or on pharyngeal structures Location of pharyngeal residue: Tongue base; Valleculae; Pyriform sinuses Penetration/Aspiration Scale (PAS) score: 7.  Material enters airway, passes BELOW cords and not ejected out despite cough attempt by patient; 8.  Material enters airway, passes BELOW cords without attempt by patient to eject out (silent aspiration)  Mildly Thick Liquids: Mildly thick liquids (Level 2, nectar thick) Mildly thick liquids (Level 2, nectar thick): Impaired Bolus delivery method: Cup; Straw Mildly Thick Liquid - Impairment: Pharyngeal impairment Initiation of swallow : Valleculae Soft palate elevation: No bolus between soft palate (SP)/pharyngeal wall (PW) Laryngeal elevation: Complete superior movement of thyroid cartilage with complete approximation of arytenoids to epiglottic petiole Anterior hyoid excursion: Complete Epiglottic movement: Complete Laryngeal vestibule closure: Incomplete, narrow column air/contrast in laryngeal vestibule Pharyngeal stripping wave : Present - diminished Pharyngeal contraction (A/P view only): N/A Pharyngoesophageal segment opening: Partial distention/partial duration, partial obstruction of flow Tongue base retraction: Trace column of contrast or air  between tongue base and PPW Pharyngeal residue: Collection of residue within or on pharyngeal structures Location of pharyngeal residue: Valleculae; Aryepiglottic folds; Pyriform sinuses Penetration/Aspiration Scale (PAS) score: 8.  Material enters airway, passes BELOW cords without attempt by patient to eject out (silent aspiration); 7.  Material enters airway, passes BELOW cords and not ejected out despite cough attempt by patient; 6.  Material enters airway, passes BELOW cords then ejected out  Moderately Thick Liquids: Moderately thick liquids (Level 3, honey thick) Moderately thick liquids (Level 3, honey thick): Impaired Bolus delivery method: Cup; Spoon Moderately Thick Liquid - Impairment: Pharyngeal impairment; Oral Impairment Tongue control during bolus hold: Escape to lateral buccal cavity/floor of mouth Bolus transport/lingual motion: Delayed initiation of tongue motion (oral holding) Oral residue: Trace residue lining oral structures Location of oral residue : Tongue Initiation of swallow : Valleculae Soft palate elevation: No bolus between soft palate (SP)/pharyngeal wall (PW) Laryngeal elevation: Complete superior movement of thyroid cartilage with complete approximation of arytenoids to epiglottic petiole Anterior hyoid excursion: Complete Epiglottic movement: Complete Laryngeal vestibule closure: Incomplete, narrow column air/contrast in laryngeal vestibule Pharyngeal stripping wave : Present - diminished Pharyngeal contraction (A/P view only): N/A Pharyngoesophageal segment opening: Partial distention/partial duration, partial obstruction of flow Tongue base retraction: Trace column of contrast or air between tongue base and PPW Pharyngeal residue: Collection of residue within or on pharyngeal structures Location of pharyngeal residue: Tongue base; Valleculae  Puree: Puree Puree: Impaired (see prior) Solid: No data recorded Pill: Pill Pill: Not Tested Compensatory Strategies: Compensatory  Strategies Compensatory strategies: Yes Straw: Ineffective Ineffective Straw: Mildly thick liquid (Level 2, nectar thick) Effortful swallow: Ineffective Ineffective Effortful Swallow: Mildly thick liquid (Level 2, nectar thick); Moderately thick liquid (Level 3, honey thick) Multiple swallows: Ineffective Ineffective Multiple Swallows: Mildly thick liquid (Level 2, nectar thick); Moderately thick liquid (Level 3, honey thick); Puree Chin tuck: Ineffective Ineffective Chin Tuck: Mildly thick liquid (Level 2, nectar thick); Moderately thick liquid (Level 3, honey thick); Thin liquid (Level 0); Puree   General Information: Caregiver present: Yes  Diet Prior to this Study: NPO; Cortrak/Small bore NG tube   Temperature : Normal   Respiratory Status: WFL   Supplemental O2: None (Room air)   History of Recent Intubation:  Yes  Behavior/Cognition: Alert; Cooperative; Pleasant mood Self-Feeding Abilities: Needs assist with self-feeding Baseline vocal quality/speech: Dysphonic Volitional Cough: Able to elicit Volitional Swallow: Able to elicit No data recorded Goal Planning: Prognosis for improved oropharyngeal function: Good No data recorded No data recorded No data recorded No data recorded Pain: Pain Assessment Pain Assessment: No/denies pain Pain Score: 2 Faces Pain Scale: 0 Breathing: 0 Negative Vocalization: 0 Facial Expression: 0 Body Language: 0 Consolability: 0 PAINAD Score: 0 Facial Expression: 0 Body Movements: 0 Muscle Tension: 0 Compliance with ventilator (intubated pts.): N/A Vocalization (extubated pts.): 0 CPOT Total: 0 Pain Location: ribs with transitional movements Pain Descriptors / Indicators: Grimacing; Guarding Pain Intervention(s): Monitored during session End of Session: Start Time:SLP Start Time (ACUTE ONLY): 1000 Stop Time: SLP Stop Time (ACUTE ONLY): 1020 Time Calculation:SLP Time Calculation (min) (ACUTE ONLY): 20 min Charges: SLP Evaluations $ SLP Speech Visit: 1 Visit SLP Evaluations $BSS Swallow:  1 Procedure $MBS Swallow: 1 Procedure $Swallowing Treatment: 1 Procedure SLP visit diagnosis: SLP Visit Diagnosis: Dysphagia, oropharyngeal phase (R13.12) Past Medical History: Past Medical History: Diagnosis Date  Acute on chronic combined systolic and diastolic CHF (congestive heart failure) ([place]) 03/19/2019  AKI (acute kidney injury) ([place]) 12/26/2014  Arthralgia 11/04/2014  Arthritis   "proabably"  Atypical mycobacterium infection   Cancer ([place])   family unaware of this hx on [id number]  Chronic systolic heart failure (HCC)   Decreased oral intake 12/26/2014  Decreased oral intake 12/26/2014  Depressed left ventricular ejection fraction 02/05/2020  Dilated cardiomyopathy ([place]) 03/19/2019  Ejection fraction 20 to 25% based on echocardiogram done by pulmonologist month ago.  DJD (degenerative joint disease) 10/07/2014  Dyspnea on exertion 03/19/2019  Essential hypertension 02/05/2020  GERD (gastroesophageal reflux disease)   Hammer toes of both feet 01/30/2018  History of thyroid cancer 12/16/2016  Hypercalcemia 12/26/2014  Hypothyroidism   Hypothyroidism associated with surgical procedure 10/07/2014  Lung mass   Myalgia 11/04/2014  Mycobacterium avium complex ([place])   Nail dystrophy 01/30/2018  Occult malignancy (HCC)   PAF (paroxysmal atrial fibrillation) ([place]) 02/05/2020  Persistent atrial fibrillation ([place]) 03/19/2019  Chads 2 Vascor equals 3  PONV (postoperative nausea and vomiting)   Pre-ulcerative calluses 01/30/2018  Pseudoaneurysm following procedure ([place]) 04/16/2020  Sensorineural hearing loss (SNHL), bilateral 08/11/2018  Shingles   Toenail fungus 01/30/2018  Wears glasses  Past Surgical History: Past Surgical History: Procedure Laterality Date  APPENDECTOMY    CATARACT EXTRACTION W/ INTRAOCULAR LENS  IMPLANT, BILATERAL Bilateral   COLONOSCOPY    INGUINAL HERNIA REPAIR Left   LEFT HEART CATH AND CORONARY ANGIOGRAPHY N/A 03/11/2020  Procedure: LEFT HEART CATH AND CORONARY ANGIOGRAPHY;  Surgeon: Jettie Booze, MD;  Location:  [place] [facility];  Service: Cardiovascular;  Laterality: N/A;  MASS EXCISION Right 08/27/2014  Procedure: EXCISION MASS RIGHT PALM/INDEX;  Surgeon: Leanora Cover, MD;  Location: [place];  Service: Orthopedics;  Laterality: Right;  THYROIDECTOMY  2003 Herbie [place], MA CCC-SLP Acute Rehabilitation Services Secure Chat Preferred Office [phone] Lynann Beaver 01/31/2023, 3:02 PM  US Abdomen Limited RUQ (LIVER/GB)  Result Date: 01/25/2023 CLINICAL DATA:  Elevated liver enzymes. EXAM: ULTRASOUND ABDOMEN LIMITED RIGHT UPPER QUADRANT COMPARISON:  01/13/2023. FINDINGS: Gallbladder: No gallstones or wall thickening visualized. No sonographic Murphy sign noted by sonographer. Common bile duct: Diameter: 3.3 mm. Liver: No focal lesion identified. Within normal limits in parenchymal echogenicity. Portal vein is patent on color Doppler imaging with normal direction of blood flow towards the liver. Other: No free fluid.  A right pleural effusion is noted. IMPRESSION: 1. No cholelithiasis or acute cholecystitis. 2. Normal liver. 3. Right pleural effusion. Electronically Signed   By: Brett Fairy M.D.   On: 01/25/2023 20:19   DG CHEST PORT 1 VIEW  Result Date: 01/22/2023 CLINICAL DATA:  Pneumonia EXAM: PORTABLE CHEST 1 VIEW COMPARISON:  01/18/2023 FINDINGS: Interval extubation. Enteric tube is again seen with distal tip beyond the inferior margin of the film. Stable heart size. Progressive patchy bilateral airspace opacities most pronounced in the right upper lobe and left lung base. Probable small left pleural effusion. No pneumothorax. IMPRESSION: Progressive patchy bilateral airspace opacities most pronounced in the right upper lobe and left lung base. Electronically Signed   By: Davina Poke D.O.   On: 01/22/2023 11:20   DG CHEST PORT 1 VIEW  Result Date: 01/18/2023 CLINICAL DATA:  89 year old male with history of pneumonia. EXAM: PORTABLE CHEST 1 VIEW COMPARISON:  Chest x-ray  01/17/2023. FINDINGS: An endotracheal tube is in place with tip 3.3 cm above the carina. There is a left-sided subclavian central venous catheter with tip terminating in the proximal superior vena cava. A feeding tube is seen extending into the abdomen, however, the tip of the feeding tube extends below the lower margin of the image. There continues to be areas of interstitial prominence, peribronchial cuffing and poorly defined airspace disease in the lungs (right-greater-than-left), however, the extent of airspace consolidation has improved compared to the prior study. No pleural effusions. No pneumothorax. No evidence of pulmonary edema. Heart size is mildly enlarged. Upper mediastinal contours are within normal limits. Atherosclerotic calcifications are noted in the thoracic aorta. IMPRESSION: 1. Support apparatus, as above. 2. Improving aeration in the lungs with persistent areas of interstitial prominence and asymmetric airspace consolidation, most compatible with a resolving multilobar bilateral bronchopneumonia (right greater than left). 3. Mild cardiomegaly. 4. Aortic atherosclerosis. Electronically Signed   By: Vinnie Langton M.D.   On: 01/18/2023 07:32   DG CHEST PORT 1 VIEW  Result Date: 01/17/2023 CLINICAL DATA:  Pulmonary edema EXAM: PORTABLE CHEST 1 VIEW COMPARISON:  Chest radiograph dated 01/16/2023 FINDINGS: Lines/tubes: Endotracheal tube tip projects 3.8 cm above the carina. Left-sided central venous catheter tip projects over the SVC. Enteric tube tip reaches the diaphragm and terminates below the field of view. Chest: Improved lung aeration with persistent right-greater-than-left patchy and interstitial opacities. Pleura: No pneumothorax.  Decreased right pleural effusion. Heart/mediastinum: Similar enlarged cardiomediastinal silhouette. Bones: No acute osseous abnormality. Surgical clips project over the cervical region. IMPRESSION: 1. Improved lung aeration with persistent  right-greater-than-left patchy and interstitial opacitie, which may reflect a combination of pulmonary edema, atelectasis, and/or infection. 2. Decreased right pleural effusion. 3. Support lines and tubes as described. Electronically Signed   By: Darrin Nipper M.D.   On: 01/17/2023 14:46   DG Abd Portable 1V  Result Date: 01/17/2023 CLINICAL DATA:  Feeding tube placement EXAM: PORTABLE ABDOMEN - 1 VIEW COMPARISON:  01/17/2023 FINDINGS: Limited radiograph of the lower chest and upper abdomen was obtained for the purposes of enteric tube localization. A new large bore enteric tube is seen coursing below the diaphragm with distal tip terminating within the expected location of the distal stomach. Increasing bilateral perihilar opacities, right greater than left. IMPRESSION: 1. New large bore enteric tube with distal tip terminating within the expected location of the distal stomach. 2. Increasing bilateral perihilar opacities, right greater than left. Electronically Signed   By: Davina Poke D.O.   On: 01/17/2023 10:07  DG Abd Portable 1V  Result Date: 01/17/2023 CLINICAL DATA:  [id number] Encounter for imaging study to confirm orogastric (OG) tube placement [id number] EXAM: PORTABLE ABDOMEN - 1 VIEW COMPARISON:  01/17/2023 at 0358 hours FINDINGS: Limited radiograph of the lower chest and upper abdomen was obtained for the purposes of enteric tube localization. Distal tip of the enteric tube projects within the proximal stomach, side port at the level of the GE junction. IMPRESSION: Distal tip of the enteric tube projects within the proximal stomach, side port at the level of the GE junction. Recommend at least 5 cm of advancement. Electronically Signed   By: Davina Poke D.O.   On: 01/17/2023 08:17   DG Abd Portable 1V  Result Date: 01/17/2023 CLINICAL DATA:  89 year old male with history of orogastric tube placement. EXAM: PORTABLE ABDOMEN - 1 VIEW COMPARISON:  Abdominal radiograph 01/16/2023. FINDINGS:  Orogastric tube has been advanced slightly, now with the tip of the tube likely in the proximal stomach, although the side port is still in the distal esophagus. Advancement of the tube an additional 10 cm is suggested for more optimal placement. Gaseous distention of the stomach is noted. IMPRESSION: 1. Support apparatus, as above. Electronically Signed   By: Vinnie Langton M.D.   On: 01/17/2023 07:17   DG Abd Portable 1V  Result Date: 01/17/2023 CLINICAL DATA:  OG tube placement EXAM: PORTABLE ABDOMEN - 1 VIEW COMPARISON:  01/16/2019 at 1530 hours FINDINGS: Enteric tube in the distal esophagus, just above the GE junction. Advancement approximately 10 cm is suggested. IMPRESSION: Enteric tube in the distal esophagus, just above the GE junction. Advancement approximately 10 cm is suggested. Electronically Signed   By: Julian Hy M.D.   On: 01/17/2023 00:07   DG Abd Portable 1V  Result Date: 01/16/2023 CLINICAL DATA:  Enteric tube placement. EXAM: PORTABLE ABDOMEN - 1 VIEW COMPARISON:  Abdominal x-ray dated January 13, 2023. FINDINGS: Enteric tube tip in the mid to distal esophagus. Distended stomach. Normal bowel gas pattern. No acute osseous abnormality. IMPRESSION: 1. Enteric tube tip in the mid to distal esophagus. Recommend advancement. Electronically Signed   By: Titus Dubin M.D.   On: 01/16/2023 15:49   DG Chest Port 1 View  Result Date: 01/16/2023 CLINICAL DATA:  Intubation.  Respiratory failure. EXAM: PORTABLE CHEST 1 VIEW COMPARISON:  Chest x-ray from 2 days ago. FINDINGS: Endotracheal tube tip in good position 2.9 cm above the carina. Enteric tube tip in the mid to distal esophagus. Left subclavian central venous catheter with tip in the mid SVC. Unchanged cardiomegaly. Worsened diffuse interstitial thickening and hazy airspace opacities. New small right pleural effusion with right-greater-than-left basilar atelectasis. No pneumothorax. No acute osseous abnormality. IMPRESSION: 1.  Appropriately positioned endotracheal tube. 2. Enteric tube tip in the mid to distal esophagus. Recommend advancement. 3. Worsened pulmonary edema and possible superimposed multifocal pneumonia. New small right pleural effusion. Electronically Signed   By: Titus Dubin M.D.   On: 01/16/2023 15:49   Overnight EEG with video  Result Date: 01/15/2023 Lora Havens, MD     01/16/2023  8:20 AM Patient Name: VI KANAN MRN: [id number] Epilepsy Attending: Lora Havens Referring Physician/Provider: Jacky Kindle, MD Duration: 01/14/2023 1159 to  01/15/2023 1159 Patient history: 89yo M s/p cardiac arrest. EEG to evaluate for seizure Level of alertness:  comatose AEDs during EEG study: Propofol Technical aspects: This EEG study was done with scalp electrodes positioned according to the 10-20 International system of electrode placement. Electrical activity was  reviewed with band pass filter of 1-70Hz , sensitivity of 7 uV/mm, display speed of 80mm/sec with a 60Hz  notched filter applied as appropriate. EEG data were recorded continuously and digitally stored.  Video monitoring was available and reviewed as appropriate. Description: EEG showed continuous generalized low amplitude 3 to 5 Hz theta-delta slowing.  Hyperventilation and photic stimulation were not performed.   Event button was pressed on 01/15/2023 for head drop, gaze upwards. Concomitant eeg before, during and after the event didn't show an eeg change to suggest seizure. ABNORMALITY - Continuous slow, generalized IMPRESSION: This study is suggestive of severe diffuse encephalopathy, nonspecific etiology. No seizures or epileptiform discharges were seen throughout the recording. Event button was pressed on 01/15/2023 for head drop, gaze upwards without concomitant eeg change. This was most likely not an epileptic event. Lora Havens   ECHOCARDIOGRAM COMPLETE  Result Date: 01/14/2023    ECHOCARDIOGRAM REPORT   Patient Name:   Curtis PUYEAR [facility] Date of Exam:  01/14/2023 Medical Rec #:  [id number]     Height:       71.0 in Accession #:    [id number]    Weight:       137.6 lb Date of Birth:  19-May-1932     BSA:          1.799 m Patient Age:    65 years      BP:           102/59 mmHg Patient Gender: M             HR:           65 bpm. Exam Location:  Inpatient Procedure: 2D Echo, Cardiac Doppler, Color Doppler and Intracardiac            Opacification Agent                                 MODIFIED REPORT: This report was modified by Rudean Haskell MD on 01/14/2023 due to Spelling.  Indications:     Cardiac arrest I46.9  History:         Patient has no prior history of Echocardiogram examinations,                  most recent 09/22/2022. Cardiomyopathy and CHF,                  Arrythmias:Atrial Fibrillation; Risk Factors:Hypertension.  Sonographer:     Ronny Flurry Referring Phys:  [id number] Lowella Dell REESE Diagnosing Phys: Rudean Haskell MD IMPRESSIONS  1. Left ventricular ejection fraction, by estimation, is 20%. The left ventricle has severely decreased function. The left ventricle demonstrates regional wall motion abnormalities (abnormal septal motion). The left ventricular internal cavity size was mildly dilated. No LV thrombus.  2. The mitral valve is normal in structure. Mild to moderate mitral valve regurgitation. No evidence of mitral stenosis.  3. The aortic valve is tricuspid. There is mild calcification of the aortic valve. There is mild thickening of the aortic valve. Aortic valve regurgitation is mild. Aortic valve sclerosis is present, with no evidence of aortic valve stenosis.  4. Right ventricular systolic function is normal. The right ventricular size is moderately enlarged.  5. The inferior vena cava is normal in size with greater than 50% respiratory variability, suggesting right atrial pressure of 3 mmHg.  6. Right atrial size was mildly dilated. FINDINGS  Left Ventricle: Left ventricular ejection fraction, by estimation,  is 20%. The left  ventricle has severely decreased function. The left ventricle demonstrates regional wall motion abnormalities. Definity contrast agent was given IV to delineate the left  ventricular endocardial borders. The left ventricular internal cavity size was mildly dilated. There is no left ventricular hypertrophy. Abnormal (paradoxical) septal motion, consistent with left bundle branch block. Left ventricular diastolic parameters are indeterminate. Right Ventricle: The right ventricular size is moderately enlarged. No increase in right ventricular wall thickness. Right ventricular systolic function is normal. Left Atrium: Left atrial size was normal in size. Right Atrium: Right atrial size was mildly dilated. Pericardium: There is no evidence of pericardial effusion. Mitral Valve: The mitral valve is normal in structure. Mild to moderate mitral valve regurgitation. No evidence of mitral valve stenosis. Tricuspid Valve: The tricuspid valve is not well visualized. Tricuspid valve regurgitation is mild . No evidence of tricuspid stenosis. Aortic Valve: The aortic valve is tricuspid. There is mild calcification of the aortic valve. There is mild thickening of the aortic valve. There is mild aortic valve annular calcification. Aortic valve regurgitation is mild. Aortic regurgitation PHT measures 844 msec. Aortic valve sclerosis is present, with no evidence of aortic valve stenosis. Aortic valve mean gradient measures 3.5 mmHg. Aortic valve peak gradient measures 7.3 mmHg. Aortic valve area, by VTI measures 2.57 cm. Pulmonic Valve: The pulmonic valve was not well visualized. Pulmonic valve regurgitation is not visualized. Aorta: The aortic root and ascending aorta are structurally normal, with no evidence of dilitation. Venous: The inferior vena cava is normal in size with greater than 50% respiratory variability, suggesting right atrial pressure of 3 mmHg. IAS/Shunts: No atrial level shunt detected by color flow Doppler.  LEFT  VENTRICLE PLAX 2D LVIDd:         5.60 cm      Diastology LVIDs:         4.90 cm      LV e' medial:    4.37 cm/s LV PW:         1.10 cm      LV E/e' medial:  12.7 LV IVS:        1.00 cm      LV e' lateral:   5.68 cm/s LVOT diam:     2.30 cm      LV E/e' lateral: 9.8 LV SV:         57 LV SV Index:   31 LVOT Area:     4.15 cm  LV Volumes (MOD) LV vol d, MOD A2C: 157.0 ml LV vol d, MOD A4C: 196.0 ml LV vol s, MOD A2C: 123.0 ml LV vol s, MOD A4C: 143.0 ml LV SV MOD A2C:     34.0 ml LV SV MOD A4C:     196.0 ml LV SV MOD BP:      36.1 ml RIGHT VENTRICLE             IVC RV S prime:     11.90 cm/s  IVC diam: 1.90 cm TAPSE (M-mode): 1.8 cm LEFT ATRIUM             Index        RIGHT ATRIUM           Index LA diam:        3.70 cm 2.06 cm/m   RA Area:     21.80 cm LA Vol (A2C):   38.2 ml 21.24 ml/m  RA Volume:   65.00 ml  36.14 ml/m LA Vol (A4C):  65.2 ml 36.25 ml/m LA Biplane Vol: 49.9 ml 27.74 ml/m  AORTIC VALVE AV Area (Vmax):    2.71 cm AV Area (Vmean):   2.50 cm AV Area (VTI):     2.57 cm AV Vmax:           135.50 cm/s AV Vmean:          86.200 cm/s AV VTI:            0.220 m AV Peak Grad:      7.3 mmHg AV Mean Grad:      3.5 mmHg LVOT Vmax:         88.47 cm/s LVOT Vmean:        51.767 cm/s LVOT VTI:          0.136 m LVOT/AV VTI ratio: 0.62 AI PHT:            844 msec  AORTA Ao Root diam: 3.60 cm Ao Asc diam:  3.30 cm MITRAL VALVE               TRICUSPID VALVE MV Area (PHT): 2.97 cm    TR Peak grad:   25.8 mmHg MV Decel Time: 256 msec    TR Vmax:        254.00 cm/s MV E velocity: 55.45 cm/s MV A velocity: 42.20 cm/s  SHUNTS MV E/A ratio:  1.31        Systemic VTI:  0.14 m                            Systemic Diam: 2.30 cm Rudean Haskell MD Electronically signed by Rudean Haskell MD Signature Date/Time: 01/14/2023/5:14:08 PM    Final (Updated)    DG Chest Port 1 View  Result Date: 01/14/2023 CLINICAL DATA:  The tracheal tube placement. EXAM: PORTABLE CHEST 1 VIEW COMPARISON:  01/13/2023 FINDINGS: 0501  hours. Endotracheal tube tip is 5.4 cm above the base of the carina. The NG tube passes into the stomach although the distal tip position is not included on the film. Left subclavian central line tip overlies the low trachea at the midline mediastinum, stable. The cardio pericardial silhouette is enlarged. Diffuse interstitial opacity noted with patchy airspace disease in the right upper and lower lung as well as right upper lobe. No evidence for pneumothorax or substantial pleural effusion. Telemetry leads overlie the chest. IMPRESSION: 1. Endotracheal tube tip is 5.4 cm above the base of the carina. 2. Left subclavian central line tip overlies the low trachea at the midline mediastinum, possibly due to leftward patient rotation. Tip position of this catheter cannot be confirmed on single plane chest x-ray. This catheter is new since CT scan yesterday. If there is concern for left subclavian catheter malposition, repeat CT or injection of a small amount of contrast under fluoro may prove helpful. Electronically Signed   By: Misty Stanley M.D.   On: 01/14/2023 05:59   DG CHEST PORT 1 VIEW  Result Date: 01/13/2023 CLINICAL DATA:  Central line placement EXAM: PORTABLE CHEST 1 VIEW COMPARISON:  Chest x-ray 01/13/2023 FINDINGS: Left-sided central venous catheter tip projects over the proximal SVC. Endotracheal tube tip is proximally 4.5 cm above the carina. Enteric tube extends below the diaphragm. Surgical clips overlie the neck. Cardiomediastinal silhouette is stable, the heart is mildly enlarged. Bilateral hazy and patchy airspace opacities have not significantly changed. There are small pleural effusions. There is no pneumothorax. Osseous structures are stable. IMPRESSION: 1. Left-sided  central venous catheter tip projects over the proximal SVC. 2. Stable bilateral hazy and patchy airspace opacities. Electronically Signed   By: Ronney Asters M.D.   On: 01/13/2023 16:36   DG Abd 1 View  Result Date:  01/13/2023 CLINICAL DATA:  NG tube placement. EXAM: ABDOMEN - 1 VIEW COMPARISON:  CT scan, same date. FINDINGS: The NG tube tip is in the body region of the stomach. The upper abdominal bowel gas pattern is unremarkable. IMPRESSION: NG tube tip is in the body region of the stomach. Electronically Signed   By: Marijo Sanes M.D.   On: 01/13/2023 16:33   CT CHEST ABDOMEN PELVIS WO CONTRAST  Result Date: 01/13/2023 CLINICAL DATA:  Patient found down, cardiopulmonary resuscitation EXAM: CT CHEST, ABDOMEN AND PELVIS WITHOUT CONTRAST TECHNIQUE: Multidetector CT imaging of the chest, abdomen and pelvis was performed following the standard protocol without IV contrast. RADIATION DOSE REDUCTION: This exam was performed according to the departmental dose-optimization program which includes automated exposure control, adjustment of the mA and/or kV according to patient size and/or use of iterative reconstruction technique. COMPARISON:  CT examinations from 06/12/2022 FINDINGS: CT CHEST FINDINGS Cardiovascular: Cardiomegaly noted. Atherosclerotic calcification of the thoracic aorta and branch vessels. Today's exam is performed without IV contrast and accordingly vascular patency is not assessed. Mediastinum/Nodes: Orogastric tube terminates at the gastroesophageal junction, consider advancing 10 cm. Nasogastric tube satisfactorily positioned. Very small retrosternal hematoma as on image 33 series 2, associated with the nondisplaced transverse sternal fracture. Lungs/Pleura: Secondary pulmonary lobular interstitial thickening with scattered airspace opacities which have accentuated density dependently favoring acute pulmonary edema. More confluent airspace opacities are present in the lower lobes and posteriorly in the right upper lobe, and aspiration pneumonia is not excluded. Small areas of confluent nodularity for example in the right middle lobe on image 80 of series 4 and in the lingula on image 75 series 4, probably  due to edema or infection although surveillance imaging will be recommended to exclude progression. No substantial pleural effusion. Musculoskeletal: In addition to more cephalad deformity in the sternal body from an old healed fracture, there is a transverse mid to lower sternal body fracture shown on image 64 series 6, extending in between the costosternal junctions of the third and fourth ribs. There are vertical fractures in the costal cartilage of the right second, third, fourth, fifth, sixth, and seventh ribs best appreciated on coronal images. There also fractures in the costal cartilage of the left anterior second, third, fourth, fifth, and sixth rib costal cartilage. In addition are fractures of the bony portions of the bilateral anterior second, third, fourth, fifth, sixth, and seventh ribs. Chondrocalcinosis in both glenohumeral joints. Thoracic spondylosis. CT ABDOMEN PELVIS FINDINGS Hepatobiliary: Trace perihepatic ascites. Reduced sensitivity for hepatic laceration due to streak artifact and lack of IV contrast. Pancreas: Unremarkable Spleen: Unremarkable Adrenals/Urinary Tract: Unremarkable Stomach/Bowel: Unremarkable Vascular/Lymphatic: Atherosclerosis is present, including aortoiliac atherosclerotic disease. Reproductive: Small bilateral hydroceles. Suspected epididymal cysts or spermatoceles bilaterally. Other: No supplemental non-categorized findings. Musculoskeletal: Calcific tendinopathy proximally in both hamstring tendons. Stable grade 1 degenerative anterolisthesis at L3-4 and L4-5. Chondrocalcinosis of the acetabular labrum bilaterally. IMPRESSION: 1. Acute nondisplaced transverse fracture of the mid to lower sternal body. 2. Acute fractures to the costal cartilage of the bilateral second through sixth ribs and also the right seventh costal cartilage. There are also acute bilateral fractures of the bony anterior portions of the bilateral second through seventh ribs. 3. Secondary pulmonary  lobular interstitial thickening with scattered airspace opacities  favoring acute pulmonary edema. More confluent airspace opacities are present in the lower lobes and posteriorly in the right upper lobe, and aspiration pneumonia is not excluded. 4. Trace perihepatic ascites. 5. Orogastric tube terminates at the gastroesophageal junction, consider advancing 10 cm. 6. Small bilateral hydroceles. Suspected epididymal cysts or spermatoceles bilaterally. 7. Chondrocalcinosis in both glenohumeral joints, acetabular labrum, and both hamstring tendons. 8. Aortic atherosclerosis. Aortic Atherosclerosis (ICD10-I70.0). Electronically Signed   By: Van Clines M.D.   On: 01/13/2023 12:37   CT CERVICAL SPINE WO CONTRAST  Result Date: 01/13/2023 CLINICAL DATA:  Patient found down between a radiator and a wall. Cardiopulmonary resuscitation. Right hand injury. EXAM: CT CERVICAL SPINE WITHOUT CONTRAST TECHNIQUE: Multidetector CT imaging of the cervical spine was performed without intravenous contrast. Multiplanar CT image reconstructions were also generated. RADIATION DOSE REDUCTION: This exam was performed according to the departmental dose-optimization program which includes automated exposure control, adjustment of the mA and/or kV according to patient size and/or use of iterative reconstruction technique. COMPARISON:  None Available. FINDINGS: Alignment: 2 mm free fused anterolisthesis at C4-5 with interbody and bilateral facet fusion. Skull base and vertebrae: Substantial spurring at the anterior C1-2 articulation. Notable pannus posterior to the odontoid. No cervical spine fracture or acute bony findings. Soft tissues and spinal canal: Edema and airspace opacities at the lung bases. Orogastric tube and endotracheal tube noted. Bilateral carotid atherosclerotic calcification. Thyroidectomy. Disc levels: Uncinate and facet spurring cause mild right foraminal impingement at C5-6 and C6-7 along with left foraminal  impingement at C3-4, C4-5, and C5-6. There is moderate central narrowing of the thecal sac at C3-4 due to chronic disc osteophyte complex and left paracentral disc protrusion. Upper chest: Please see dedicated chest CT report. Other: No supplemental non-categorized findings. IMPRESSION: 1. No acute cervical spine findings. 2. Cervical spondylosis and degenerative disc disease causing multilevel foraminal impingement. There is also moderate central narrowing of the thecal sac at C3-4 due to chronic disc osteophyte complex and left paracentral disc protrusion. 3. 2 mm free fused anterolisthesis at C4-5. 4. Edema and airspace opacities at the lung bases. 5. Bilateral carotid atherosclerotic calcification. Electronically Signed   By: Van Clines M.D.   On: 01/13/2023 12:23   CT HEAD WO CONTRAST  Result Date: 01/13/2023 CLINICAL DATA:  Head trauma. EXAM: CT HEAD WITHOUT CONTRAST TECHNIQUE: Contiguous axial images were obtained from the base of the skull through the vertex without intravenous contrast. RADIATION DOSE REDUCTION: This exam was performed according to the departmental dose-optimization program which includes automated exposure control, adjustment of the mA and/or kV according to patient size and/or use of iterative reconstruction technique. COMPARISON:  Head MRI 06/30/2017 FINDINGS: Brain: There is no evidence of an acute infarct, intracranial hemorrhage, mass, midline shift, or extra-axial fluid collection. Mild cerebral atrophy is within normal limits for age. Cerebral white matter hypodensities are nonspecific but compatible with mild chronic small vessel ischemic disease. Vascular: Calcified atherosclerosis at the skull base. No hyperdense vessel. Skull: No acute fracture or suspicious osseous lesion. Sinuses/Orbits: Trace fluid in the right sphenoid sinus. Clear mastoid air cells. Bilateral cataract extraction. Other: Small to moderate-sized right parietal scalp hematoma and laceration.  IMPRESSION: 1. No evidence of acute intracranial abnormality. 2. Right parietal scalp hematoma. Electronically Signed   By: Logan Bores M.D.   On: 01/13/2023 12:22   DG Pelvis Portable  Result Date: 01/13/2023 CLINICAL DATA:  Possible trauma, found unresponsive at work EXAM: PORTABLE PELVIS 1-2 VIEWS COMPARISON:  None Available. FINDINGS: No displaced  fracture or dislocation is seen. Bony spurs are noted in both hips. Scattered vascular calcifications are seen in soft tissues. Degenerative changes are noted in visualized lower lumbar spine. IMPRESSION: No displaced fracture or dislocation is seen. Electronically Signed   By: Elmer Picker M.D.   On: 01/13/2023 12:10   DG Chest Port 1 View  Result Date: 01/13/2023 CLINICAL DATA:  Pain after trauma EXAM: PORTABLE CHEST 1 VIEW COMPARISON:  X-ray 06/12/2022 FINDINGS: ET tube in place with tip 5 cm above the carina enlarged cardiopericardial silhouette. Overlapping cardiac leads and defibrillator pads. Diffuse interstitial changes identified, possibly components of edema. Apical pleural thickening on the right-greater-than-left. No obvious pneumothorax or effusion of the inferior costophrenic angles are clipped off the edge of the film. Surgical clips along the thoracic inlet. IMPRESSION: ET tube in place. Hyperinflation with enlarged cardiopericardial silhouette and interstitial edema. Electronically Signed   By: Jill Side M.D.   On: 01/13/2023 12:04    Labs:  CBC: Recent Labs    01/30/23 0212 02/03/23 0536 02/04/23 1307 02/05/23 0117  WBC 10.5 13.0* 13.1* 13.3*  HGB 10.4* 10.9* 11.3* 11.0*  HCT 31.7* 32.7* 34.1* 32.0*  PLT 417* 419* 374 347    COAGS: Recent Labs    01/13/23 1223 01/18/23 1843 01/22/23 1620 01/23/23 0120 01/23/23 1810 01/24/23 0213  INR 2.2*  --   --   --   --   --   APTT  --    < > 68* 61* 76* 93*   < > = values in this interval not displayed.    BMP: Recent Labs    01/28/23 0246 01/30/23 0212  02/03/23 0537 02/05/23 0117  NA 140 142 138 137  K 3.7 4.2 4.2 4.3  CL 111 111 107 108  CO2 24 23 24 23   GLUCOSE 110* 108* 98 107*  BUN 33* 33* 33* 37*  CALCIUM 9.8 10.2 10.3 10.0  CREATININE 1.03 1.01 1.10 1.09  GFRNONAA >60 >60 >60 >60    LIVER FUNCTION TESTS: Recent Labs    01/26/23 0147 01/27/23 0143 01/28/23 0246 01/30/23 0212 02/03/23 0537 02/05/23 0117  BILITOT 0.3 0.6 0.6 0.4  --   --   AST 53* 51* 47* 37  --   --   ALT 117* 109* 101* 80*  --   --   ALKPHOS 125 130* 138* 153*  --   --   PROT 6.6 6.1* 6.5 6.7  --   --   ALBUMIN 2.2* 2.2* 2.3* 2.5* 2.7* 2.6*    TUMOR MARKERS: No results for input(s): "AFPTM", "CEA", "CA199", "CHROMGRNA" in the last 8760 hours.  Assessment and Plan:  Dysphagia Protein calorie malnutrition For DC to SNF asap Scheduled tentatively for percutaneous gastric tube placement in IR 3/26 Tuesday Risks and benefits image guided gastrostomy tube placement was discussed with the patient and son David/POA via phone including, but not limited to the need for a barium enema during the procedure, bleeding, infection, peritonitis and/or damage to adjacent structures.  All of the patient's questions were answered, patient is agreeable to proceed.  Consent signed and in chart.  Thank you for this interesting consult.  I greatly enjoyed meeting Curtis Ramos and look forward to participating in their care.  A copy of this report was sent to the requesting provider on this date.  Electronically Signed: Lavonia Drafts, PA-C 02/06/2023, 9:10 AM   I spent a total of 40 Minutes    in face to face in clinical  consultation, greater than 50% of which was counseling/coordinating care for percutaneous gastric tube placement

## 2023-02-07 DIAGNOSIS — R1312 Dysphagia, oropharyngeal phase: Secondary | ICD-10-CM | POA: Diagnosis not present

## 2023-02-07 DIAGNOSIS — I5022 Chronic systolic (congestive) heart failure: Secondary | ICD-10-CM | POA: Diagnosis not present

## 2023-02-07 DIAGNOSIS — I469 Cardiac arrest, cause unspecified: Secondary | ICD-10-CM | POA: Diagnosis not present

## 2023-02-07 DIAGNOSIS — N179 Acute kidney failure, unspecified: Secondary | ICD-10-CM | POA: Diagnosis not present

## 2023-02-07 LAB — GLUCOSE, CAPILLARY
Glucose-Capillary: 105 mg/dL — ABNORMAL HIGH (ref 70–99)
Glucose-Capillary: 116 mg/dL — ABNORMAL HIGH (ref 70–99)
Glucose-Capillary: 127 mg/dL — ABNORMAL HIGH (ref 70–99)
Glucose-Capillary: 128 mg/dL — ABNORMAL HIGH (ref 70–99)
Glucose-Capillary: 132 mg/dL — ABNORMAL HIGH (ref 70–99)
Glucose-Capillary: 98 mg/dL (ref 70–99)

## 2023-02-07 LAB — CBC WITH DIFFERENTIAL/PLATELET
Abs Immature Granulocytes: 0.06 10*3/uL (ref 0.00–0.07)
Basophils Absolute: 0 10*3/uL (ref 0.0–0.1)
Basophils Relative: 0 %
Eosinophils Absolute: 0.2 10*3/uL (ref 0.0–0.5)
Eosinophils Relative: 1 %
HCT: 31.2 % — ABNORMAL LOW (ref 39.0–52.0)
Hemoglobin: 10.1 g/dL — ABNORMAL LOW (ref 13.0–17.0)
Immature Granulocytes: 1 %
Lymphocytes Relative: 9 %
Lymphs Abs: 1 10*3/uL (ref 0.7–4.0)
MCH: 36.5 pg — ABNORMAL HIGH (ref 26.0–34.0)
MCHC: 32.4 g/dL (ref 30.0–36.0)
MCV: 112.6 fL — ABNORMAL HIGH (ref 80.0–100.0)
Monocytes Absolute: 1.1 10*3/uL — ABNORMAL HIGH (ref 0.1–1.0)
Monocytes Relative: 10 %
Neutro Abs: 8.8 10*3/uL — ABNORMAL HIGH (ref 1.7–7.7)
Neutrophils Relative %: 79 %
Platelets: 281 10*3/uL (ref 150–400)
RBC: 2.77 MIL/uL — ABNORMAL LOW (ref 4.22–5.81)
RDW: 15.3 % (ref 11.5–15.5)
WBC: 11.2 10*3/uL — ABNORMAL HIGH (ref 4.0–10.5)
nRBC: 0 % (ref 0.0–0.2)

## 2023-02-07 LAB — COMPREHENSIVE METABOLIC PANEL
ALT: 39 U/L (ref 0–44)
AST: 24 U/L (ref 15–41)
Albumin: 2.4 g/dL — ABNORMAL LOW (ref 3.5–5.0)
Alkaline Phosphatase: 153 U/L — ABNORMAL HIGH (ref 38–126)
Anion gap: 6 (ref 5–15)
BUN: 30 mg/dL — ABNORMAL HIGH (ref 8–23)
CO2: 23 mmol/L (ref 22–32)
Calcium: 10.2 mg/dL (ref 8.9–10.3)
Chloride: 103 mmol/L (ref 98–111)
Creatinine, Ser: 0.94 mg/dL (ref 0.61–1.24)
GFR, Estimated: >60 mL/min (ref >60)
Glucose, Bld: 119 mg/dL — ABNORMAL HIGH (ref 70–99)
Potassium: 4.3 mmol/L (ref 3.5–5.1)
Sodium: 132 mmol/L — ABNORMAL LOW (ref 135–145)
Total Bilirubin: 0.7 mg/dL (ref 0.3–1.2)
Total Protein: 7 g/dL (ref 6.5–8.1)

## 2023-02-07 LAB — PROTIME-INR
INR: 1.3 — ABNORMAL HIGH (ref 0.8–1.2)
Prothrombin Time: 15.9 seconds — ABNORMAL HIGH (ref 11.4–15.2)

## 2023-02-07 MED ORDER — DEXTROSE IN LACTATED RINGERS 5 % IV SOLN: INTRAVENOUS | Status: AC

## 2023-02-07 MED ORDER — LABETALOL HCL 5 MG/ML IV SOLN: 10.0000 mg | INTRAVENOUS | Status: DC | PRN

## 2023-02-07 NOTE — TOC Progression Note (Signed)
Transition of Care [facility]) - Progression Note    Patient Details  Name: Curtis Ramos MRN: [id number] Date of Birth: 17-May-1932  Transition of Care [facility]) CM/SW [place], LCSW Phone Number: 02/07/2023, 11:35 AM  Clinical Narrative:    CSW spoke with pt son Curtis Ramos regarding SNF recommendation. Son stated that they would like for pt to dc to [facility], if possible. If their first choice doesn't want they are okay with SNF's in [place].   CSW completed fl2 and faxed out. TOC will continue to follow this admission.    Expected Discharge Plan: [place] Barriers to Discharge: Continued Medical Work up  Expected Discharge Plan and Services In-house Referral: Clinical Social Work Discharge Planning Services: CM Consult Post Acute Care Choice: Long Term Acute Care (LTAC) Living arrangements for the past 2 months: Single Family Home                                       Social Determinants of Health (SDOH) Interventions SDOH Screenings   Food Insecurity: No Food Insecurity (01/18/2023)  Housing: Low Risk  (01/18/2023)  Transportation Needs: No Transportation Needs (01/18/2023)  Utilities: Not At Risk (01/18/2023)  Financial Resource Strain: Low Risk  (01/18/2023)  Tobacco Use: Low Risk  (01/13/2023)    Readmission Risk Interventions     No data to display        Beckey Rutter, MSW, LCSWA, LCASA Transitions of Care  Clinical Social Worker I

## 2023-02-07 NOTE — Progress Notes (Signed)
PROGRESS NOTE    Curtis Ramos  [id number] DOB: 01/18/1932 DOA: 01/13/2023 PCP: Leonides Sake, MD  Brief Narrative:  89 year old man who presented to [facility] ED 2/29 via EMS after unwitnessed fall at work. PMHx significant for HTN, HFrEF with NICM (cMRI 12/2022 with severe LV dilation, EF 17%, LBBB, followed by Dr. Haroldine Laws), AFib (on Eliquis), MAI infection, GERD, hypothyroidism, history of thyroid CA.   History obtained from chart review and from family. Patient presented to [facility] ED via EMS after a fall at work; per family, patient did not have any complaints prior to fall. Recently undergoing workup with Cardiology for pacemaker placement. He was found down between a radiator and the wall, unresponsive with contusion and deformities to R side of his head. Initially with tachycardic with EMS, then bradycardic. ST elevation noted on EKG and Code STEMI was called, later called off as ST elevation had resolved. Patient lost pulses while in ED and became apneic with cardiac monitoring showing PEA arrest. CPR administered x 9 minutes with Epi x 4 and ROSC, patient never had a shockable rhythm. Intubated peri-arrest. Cards was consulted, noting high risk of VT/VF in the setting of severe CM. AHF was also notified of admission.  Hospital Course: 89 year old M with PMH of systolic CHF, NICM, A-fib on Eliquis, HTN, hypothyroidism, thyroid cancer and GERD presented to ED on 2/29 after unwitnessed fall at work.  As per the PCCM notes patient was found down between the radiator and the wall unresponsive with contusion.  On arrival to ED patient became apneic and lost pulses and cardiac monitoring showed PEA arrest.  CPR administered for 9 minutes with epi x 4 and ROSC achieved.  Patient was intubated and admitted to ICU. cardiology was consulted. CT of the chest abdomen and pelvis showed known displaced transverse fractures of the mid lower sternal body, acute fracture of the coastal cartilage of bilateral  second through sixth ribs, acute fracture of the anterior second to 7 ribs.  He was started on Unasyn for aspiration pneumonitis, later transitioned to cefepime for Pseudomonas VAP.     Patient was extubated on 01/19/2023, and transferred to [facility] on 01/21/2023. On 3/9, he became tachypneic and requiried BIPAP for 24 hours, and transitioned to HF oxygen.  Patient was weaned off oxygen to room air on 3/12.  Remains on TF via cortrack due to significant risk for aspiration.  Hospital course significant for delirium that seems to have resolved.   After discussion with family, IR was consulted for G-tube.  However, SLP reevaluated patient and advanced diet to dysphagia-1.  Now family wants to hold on G-tube.  SLP advancing him further to dysphagia 2 diet.  Unfortunately, patient's p.o. intake is very poor.  After discussion with RD and patient's son, IR reconsulted for G-tube.   Final disposition SNF after G-tube which is tentatively planned for 02/08/2023.    Assessment and Plan: * Cardiac arrest ([place]) -2/29 PEA arrest--> 9 min CPR 4 rounds epi. Unshockable rhythm.  -Rib fracture 2-7 ribs - Using lidocaine patch.  -Off pressors. Central line out. Weaned off vasopressors. -Continue amiodarone to suppress PVCs.  Oropharyngeal dysphagia Advance to dysphagia 2 diet by SLP but p.o. intake remains poor. Remains on continuous tube feed. -IR reconsulted.  G-tube scheduled for 3/26. Hold TF at [facility]. Lovenox on hold, will need to restart after PEG placement.  Acute urinary retention Resolved. Has external condom catheter  Elevated liver enzymes Alk phos still slightly elevated but stable. AST/ALT now normal.  Likely due to sepsis vs cholestatis.  Delirium Improved today. Pt knows his birthdate. That it is March 2024 and that [place] is the next holiday.  Likely due to cardiac arrest.   VAP (ventilator-associated pneumonia) ([place]) Resolved. Completing course of IV cefepime on 3/13    Hematochezia Resolved. Had been on Eliquis prior to admission for hx of PAF. Eliquis stopped. [facility] saw patient. Did not feel pt required EGD or colonoscopy. HgB stable 11 g/dl  Protein-calorie malnutrition, severe See RD note 02-04-2023 Severe Malnutrition related to chronic illness (CHF) as evidenced by severe fat depletion, severe muscle depletion.   Acute respiratory failure with hypoxia (HCC) Resolved. On RA Multifactorial-PEA arrest, sternal and rib fractures and aspiration pneumonia. -Extubated on 3/6.  Currently on room air. -Pulmonary toilet and aspiration precaution  Septic shock ([place]) Resolved. Transferred out of ICU on 01-21-2023  Hypothyroidism On synthroid 175 mcg daily. TSH 29.48 on 01-13-2023. Will recheck TSH on 02-11-2023.  Chronic systolic CHF (congestive heart failure) (HCC) Stable. Echo 01-2023 shows LVEF of 20%   AKI (acute kidney injury) ([place]) resolved   DVT prophylaxis: SQ Heparin   Code Status: Full Code Family Communication: discussed with Camerron Garnier via [phone]). Informed him that pt would likely be discharged towards the end of this week. Disposition Plan: to SNF after PEG placed and stable on TTF  Consultants:  PCCM PMR GI Palliative Care Advanced Heart Failure Cardiology Trauma  Procedures:  Intubation 01-13-2023  Antimicrobials:  Cefepime completed on 01-26-2023    Subjective: Stable overnight. Continues with Cortrak. On Schedule for PEG by IR tomorrow.  calle pt's son Ramar Grass @ [id number]. Discussed with him that DC from hospital would be towards the middle/end of this week if SNF bed located and insurance is authorized.  PEG via IR ordered from tomorrow. Will hold TF tonight at [facility].   Objective: Vitals:   02/06/23 0803 02/06/23 1618 02/06/23 2021 02/07/23 0812  BP: 125/65 109/62 (!) 140/64 118/65  Pulse: 84 80 82 80  Resp: 18 18 16 18   Temp: 98 F (36.7 C) 97.8 F (36.6 C) 98.1 F (36.7 C) (!) 97.2 F (36.2  C)  TempSrc: Oral Oral  Oral  SpO2: 100% 96% 96% 97%  Weight:      Height:        Intake/Output Summary (Last 24 hours) at 02/07/2023 0835 Last data filed at 02/07/2023 0815 Gross per 24 hour  Intake 2311 ml  Output 2600 ml  Net -289 ml   Filed Weights   02/04/23 0326 02/05/23 0456 02/06/23 0500  Weight: 56.4 kg 58.4 kg 58.6 kg    Examination:  Physical Exam Constitutional:      General: He is not in acute distress.    Appearance: He is not toxic-appearing.  HENT:     Head: Normocephalic and atraumatic.     Nose:     Comments: Cortrak in place Cardiovascular:     Rate and Rhythm: Normal rate and regular rhythm.  Pulmonary:     Effort: Pulmonary effort is normal. No respiratory distress.  Abdominal:     General: Bowel sounds are normal. There is no distension.     Palpations: Abdomen is soft.  Musculoskeletal:     Right lower leg: No edema.     Left lower leg: No edema.  Skin:    General: Skin is warm and dry.     Capillary Refill: Capillary refill takes less than 2 seconds.  Neurological:     Mental  Status: He is alert.     Comments: Knows he is at [place] his birthdate Franco Nones that it is March 2024 and that [place] is the next major holiday.     Data Reviewed: I have personally reviewed following labs and imaging studies  CBC: Recent Labs  Lab 02/03/23 0536 02/04/23 1307 02/05/23 0117 02/07/23 0455  WBC 13.0* 13.1* 13.3* 11.2*  NEUTROABS  --   --   --  8.8*  HGB 10.9* 11.3* 11.0* 10.1*  HCT 32.7* 34.1* 32.0* 31.2*  MCV 110.8* 111.8* 113.9* 112.6*  PLT 419* 374 347 [id number]   Basic Metabolic Panel: Recent Labs  Lab 02/03/23 0536 02/03/23 0537 02/05/23 0117 02/07/23 0455  NA  --  138 137 132*  K  --  4.2 4.3 4.3  CL  --  107 108 103  CO2  --  24 23 23   GLUCOSE  --  98 107* 119*  BUN  --  33* 37* 30*  CREATININE  --  1.10 1.09 0.94  CALCIUM  --  10.3 10.0 10.2  MG 2.2  --  1.9  --   PHOS  --  2.9 2.4*  --    GFR: Estimated  Creatinine Clearance: 45 mL/min (by C-G formula based on SCr of 0.94 mg/dL). Liver Function Tests: Recent Labs  Lab 02/03/23 0537 02/05/23 0117 02/07/23 0455  AST  --   --  24  ALT  --   --  39  ALKPHOS  --   --  153*  BILITOT  --   --  0.7  PROT  --   --  7.0  ALBUMIN 2.7* 2.6* 2.4*    BNP (last 3 results) Recent Labs    06/18/22 1331 10/05/22 0848  PROBNP 1,795* 3,434*   CBG: Recent Labs  Lab 02/06/23 1622 02/06/23 2025 02/07/23 0024 02/07/23 0519 02/07/23 0814  GLUCAP 131* 105* 116* 128* 105*    No results found for this or any previous visit (from the past 240 hour(s)).   Radiology Studies: No results found.   Scheduled Meds:  amiodarone  200 mg Oral Daily   [START ON 02/08/2023] enoxaparin (LOVENOX) injection  40 mg Subcutaneous Q24H   feeding supplement  237 mL Oral TID BM   free water  200 mL Per Tube Q4H   levothyroxine  175 mcg Oral Q0600   lidocaine  1 patch Transdermal Q24H   losartan  12.5 mg Oral Daily   mirtazapine  7.5 mg Oral QHS   mouth rinse  15 mL Mouth Rinse 4 times per day   pantoprazole (PROTONIX) IV  40 mg Intravenous Q12H   spironolactone  12.5 mg Oral Daily   Continuous Infusions:  sodium chloride Stopped (01/25/23 1632)   [START ON 02/08/2023]  ceFAZolin (ANCEF) IV     [START ON 02/08/2023] dextrose 5% lactated ringers     feeding supplement (OSMOLITE 1.5 CAL) 60 mL/hr at 02/05/23 1700     LOS: 25 days    Time spent: 31 minutes    Kristopher Oppenheim, DO  [facility]  02/07/2023, 8:35 AM

## 2023-02-07 NOTE — Progress Notes (Signed)
   Bedside RN could not get Cortrak unclogged despite multiple attempts. Pt on schedule for PEG tomorrow. Will DC cortrak and start IVF.  Kristopher Oppenheim, DO [facility]

## 2023-02-07 NOTE — NC FL2 (Signed)
[place] LEVEL OF CARE FORM     IDENTIFICATION  Patient Name: Curtis Ramos Birthdate: 1932-01-01 Sex: male Admission Date (Current Location): 01/13/2023  [facility] and [place] Number:  Herbalist and Address:  [facility], [place] [address], [place], [place] [address]      Provider Number: [id number]  Attending Physician Name and Address:  Kristopher Oppenheim, DO  Relative Name and Phone Number:  [name] [phone]    Current Level of Care: Hospital Recommended Level of Care: [place] Prior Approval Number:    Date Approved/Denied:   PASRR Number: [id number]  Discharge Plan: SNF    Current Diagnoses: Patient Active Problem List   Diagnosis Date Noted   Oropharyngeal dysphagia 01/27/2023   Elevated liver enzymes 01/27/2023   Acute urinary retention 01/27/2023   Hematochezia 01/26/2023   VAP (ventilator-associated pneumonia) ([place]) 01/26/2023   Delirium 01/26/2023   Protein-calorie malnutrition, severe 01/14/2023   Cardiac arrest ([place]) 01/13/2023   Septic shock ([place]) 01/13/2023   Acute respiratory failure with hypoxia ([place]) 01/13/2023   Permanent atrial fibrillation ([place]) 10/15/2020   Wears glasses    Shingles    Mycobacterium avium complex ([place])    Lung mass    Hypothyroidism    GERD (gastroesophageal reflux disease)    Cancer ([place])    Arthritis    Pseudoaneurysm following procedure ([place]) [id number]   Chronic systolic CHF (congestive heart failure) (HCC)    PAF (paroxysmal atrial fibrillation) ([place]) 02/05/2020   Depressed left ventricular ejection fraction 02/05/2020   Essential hypertension 02/05/2020   Dilated cardiomyopathy ([place]) 03/19/2019   Acute on chronic combined systolic and diastolic CHF (congestive heart failure) ([place]) 03/19/2019   Dyspnea on exertion 03/19/2019   Persistent atrial fibrillation ([place]) 03/19/2019   Sensorineural hearing loss (SNHL), bilateral 08/11/2018   Hammer toes of both feet  01/30/2018   Nail dystrophy 01/30/2018   Pre-ulcerative calluses 01/30/2018   Toenail fungus 01/30/2018   History of thyroid cancer 12/16/2016   Occult malignancy ([place])    AKI (acute kidney injury) ([place]) 12/26/2014   Decreased oral intake 12/26/2014   Myalgia 11/04/2014   Arthralgia 11/04/2014   Hypothyroidism associated with surgical procedure 10/07/2014   DJD (degenerative joint disease) 10/07/2014   Atypical mycobacterium infection     Orientation RESPIRATION BLADDER Height & Weight     Self, Time, Situation, Place  Normal Incontinent, External catheter Weight: 129 lb 3 oz (58.6 kg) Height:  5\' 11"  (180.3 cm)  BEHAVIORAL SYMPTOMS/MOOD NEUROLOGICAL BOWEL NUTRITION STATUS      Incontinent Diet (See dc summary)  AMBULATORY STATUS COMMUNICATION OF NEEDS Skin   Extensive Assist Verbally Normal                       Personal Care Assistance Level of Assistance  Bathing, Feeding, Dressing Bathing Assistance: Maximum assistance Feeding assistance: Limited assistance Dressing Assistance: Maximum assistance     Functional Limitations Info  Sight, Hearing, Speech Sight Info: Impaired Hearing Info: Impaired Speech Info: Adequate    SPECIAL CARE FACTORS FREQUENCY  OT (By licensed OT), PT (By licensed PT)     PT Frequency: 5xweek OT Frequency: 5xweek            Contractures Contractures Info: Not present    Additional Factors Info  Code Status, Allergies Code Status Info: full Allergies Info: NKA           Current Medications (02/07/2023):  This is the current  hospital active medication list Current Facility-Administered Medications  Medication Dose Route Frequency Provider Last Rate Last Admin   0.9 %  sodium chloride infusion  250 mL Intravenous Continuous Hosie Poisson, MD   Stopped at 01/25/23 1632   acetaminophen (TYLENOL) tablet 650 mg  650 mg Oral Q6H PRN Mercy Riding, MD       amiodarone (PACERONE) tablet 200 mg  200 mg Oral Daily Wendee Beavers T, MD    200 mg at 02/07/23 0857   [START ON 02/08/2023] ceFAZolin (ANCEF) IVPB 2g/100 mL premix  2 g Intravenous to Edward Jolly, PA-C       [START ON 02/08/2023] dextrose 5 % in lactated ringers infusion   Intravenous Continuous Kristopher Oppenheim, DO       docusate (COLACE) 50 MG/5ML liquid 100 mg  100 mg Oral BID PRN Mercy Riding, MD       [START ON 02/08/2023] enoxaparin (LOVENOX) injection 40 mg  40 mg Subcutaneous Q24H Turpin, Pamela, PA-C       feeding supplement (ENSURE ENLIVE / ENSURE PLUS) liquid 237 mL  237 mL Oral TID BM Kristopher Oppenheim, DO   237 mL at 02/06/23 1422   feeding supplement (OSMOLITE 1.5 CAL) liquid 1,000 mL  1,000 mL Per Tube Continuous Kristopher Oppenheim, DO 60 mL/hr at 02/05/23 1700 Infusion Verify at 02/05/23 1700   free water 200 mL  200 mL Per Tube Q4H Kristopher Oppenheim, DO   200 mL at 02/07/23 0522   levothyroxine (SYNTHROID) tablet 175 mcg  175 mcg Oral Q0600 Wendee Beavers T, MD   175 mcg at 02/07/23 0518   lidocaine (LIDODERM) 5 % 1 patch  1 patch Transdermal Q24H Hosie Poisson, MD   1 patch at 02/06/23 1055   lip balm (BLISTEX) ointment   Topical PRN Hosie Poisson, MD       losartan (COZAAR) tablet 12.5 mg  12.5 mg Oral Daily Bensimhon, Shaune Pascal, MD   12.5 mg at 02/07/23 0856   mirtazapine (REMERON) tablet 7.5 mg  7.5 mg Oral QHS Wendee Beavers T, MD   7.5 mg at 02/06/23 2042   ondansetron (ZOFRAN) injection 4 mg  4 mg Intravenous Q6H PRN Hosie Poisson, MD   4 mg at 01/19/23 1147   Oral care mouth rinse  15 mL Mouth Rinse 4 times per day Hosie Poisson, MD   15 mL at 02/06/23 1607   pantoprazole (PROTONIX) injection 40 mg  40 mg Intravenous Q12H Hosie Poisson, MD   40 mg at 02/07/23 0857   polyethylene glycol (MIRALAX / GLYCOLAX) packet 17 g  17 g Oral Daily PRN Gonfa, Taye T, MD       senna-docusate (Senokot-S) tablet 2 tablet  2 tablet Oral QHS PRN Wendee Beavers T, MD       spironolactone (ALDACTONE) tablet 12.5 mg  12.5 mg Oral Daily Wendee Beavers T, MD   12.5 mg at 02/07/23 [id number]     Discharge  Medications: Please see discharge summary for a list of discharge medications.  Relevant Imaging Results:  Relevant Lab Results:   Additional Information SSN: [id number]  Beckey Rutter, MSW, [place] Transitions of Care  Clinical Social Worker I

## 2023-02-07 NOTE — Progress Notes (Signed)
Physical Therapy Treatment Patient Details Name: Curtis Ramos MRN: [id number] DOB: March 11, 1932 Today's Date: 02/07/2023   History of Present Illness Pt is 89 year old presented to [facility] on  01/13/23 after unwitnessed fall at work. In ED pt had PEA arrest with CPR x 9 minutes and intubated. Pt with respiratory failure and septic shock due to pulmonary edema, atelectasis post CPR rib/sternal fxs, PNA, and acute on chronic heart failure. Failed extubation 3/3 and reintubated until extubated 3/6. PMH - HTN, heart failure, afib, thyroid CA    PT Comments    Patient progressing slowly towards PT goals. Session focused on standing and functional mobility. Requires Mod A for bed mobility and Mod-Max A of 2 for standing trials. Continues to demonstrate heavy posterior lean but able to improve posture/upright/balance with max cues. Requires Max A of 2 to take a few steps to get to chair. Continues to require post acute rehab to maximize independence and mobility prior to return home. Pain in bil feet limiting upright tolerance today. Will follow.   Recommendations for follow up therapy are one component of a multi-disciplinary discharge planning process, led by the attending physician.  Recommendations may be updated based on patient status, additional functional criteria and insurance authorization.  Follow Up Recommendations  Can patient physically be transported by private vehicle: No    Assistance Recommended at Discharge Frequent or constant Supervision/Assistance  Patient can return home with the following A lot of help with bathing/dressing/bathroom;Assistance with cooking/housework;Direct supervision/assist for medications management;Direct supervision/assist for financial management;Assist for transportation;Help with stairs or ramp for entrance;Two people to help with walking and/or transfers   Equipment Recommendations  Wheelchair (measurements PT);Wheelchair cushion (measurements PT)     Recommendations for Other Services       Precautions / Restrictions Precautions Precautions: Fall;Other (comment) Precaution Comments: cortrak Restrictions Weight Bearing Restrictions: No     Mobility  Bed Mobility Overal bed mobility: Needs Assistance Bed Mobility: Rolling, Sidelying to Sit Rolling: Mod assist Sidelying to sit: HOB elevated, Mod assist, +2 for physical assistance       General bed mobility comments: Assist with LEs, scooting bottom, reaching for rail and to elevate trunk to get to EOB, difficulty keeping head up.    Transfers Overall transfer level: Needs assistance Equipment used: Rolling walker (2 wheels) Transfers: Sit to/from Stand Sit to Stand: Mod assist, Max assist, +2 physical assistance, From elevated surface   Step pivot transfers: Max assist, +2 physical assistance       General transfer comment: Mod-Max A of 2 to stand from EOB with use of momentum and anterior weight shift. Stood x3 with posterior lean, assist needed for forward weight shift, foot placement and balance. Able to take a few steps to get to chair using pad and belt, Max A of 2, difficulty picking up feet.    Ambulation/Gait               General Gait Details: unable   Stairs             Wheelchair Mobility    Modified Rankin (Stroke Patients Only)       Balance Overall balance assessment: Needs assistance Sitting-balance support: Feet supported, No upper extremity supported Sitting balance-Leahy Scale: Fair Sitting balance - Comments: UE support and intermittent cues to correct posterior lean Postural control: Posterior lean Standing balance support: During functional activity, Bilateral upper extremity supported, Reliant on assistive device for balance Standing balance-Leahy Scale: Poor Standing balance comment: Requires external support and  UE support, limited standing tolerance, posterior lean.                            Cognition  Arousal/Alertness: Lethargic Behavior During Therapy: Flat affect Overall Cognitive Status: Impaired/Different from baseline Area of Impairment: Orientation, Memory, Following commands, Safety/judgement, Awareness, Problem solving                 Orientation Level: Disoriented to, Time, Situation   Memory: Decreased short-term memory Following Commands: Follows one step commands with increased time Safety/Judgement: Decreased awareness of safety, Decreased awareness of deficits Awareness: Intellectual Problem Solving: Slow processing, Decreased initiation, Difficulty sequencing, Requires verbal cues, Requires tactile cues General Comments: Requires repetition to follow commands. Lethargic initially but wakes up once EOB.When given options for month, Jan or March, states neither, knows it is [place] soon.        Exercises General Exercises - Lower Extremity Long Arc Quad: AROM, Both, 10 reps, Seated    General Comments        Pertinent Vitals/Pain Pain Assessment Pain Assessment: Faces Faces Pain Scale: Hurts even more Pain Location: bil feet Pain Descriptors / Indicators: Discomfort, Guarding, Grimacing Pain Intervention(s): Monitored during session, Limited activity within patient's tolerance, Repositioned    Home Living                          Prior Function            PT Goals (current goals can now be found in the care plan section) Acute Rehab PT Goals Patient Stated Goal: decrease pain, get better and go home PT Goal Formulation: With patient Time For Goal Achievement: 02/21/23 Potential to Achieve Goals: Good Progress towards PT goals: Progressing toward goals (slowly)    Frequency    Min 1X/week      PT Plan Current plan remains appropriate    Co-evaluation              AM-PAC PT "6 Clicks" Mobility   Outcome Measure  Help needed turning from your back to your side while in a flat bed without using bedrails?: A Lot Help  needed moving from lying on your back to sitting on the side of a flat bed without using bedrails?: Total Help needed moving to and from a bed to a chair (including a wheelchair)?: Total Help needed standing up from a chair using your arms (e.g., wheelchair or bedside chair)?: Total Help needed to walk in hospital room?: Total Help needed climbing 3-5 steps with a railing? : Total 6 Click Score: 7    End of Session Equipment Utilized During Treatment: Gait belt Activity Tolerance: Patient tolerated treatment well Patient left: in chair;with call bell/phone within reach (no alarm box but pad under pt, RN aware) Nurse Communication: Mobility status;Need for lift equipment (stedy vs lateral scoot with 2 person) PT Visit Diagnosis: Unsteadiness on feet (R26.81);Muscle weakness (generalized) (M62.81)     Time: [id number] PT Time Calculation (min) (ACUTE ONLY): 21 min  Charges:  $Therapeutic Activity: 8-22 mins                     Marisa Severin, PT, DPT Acute Rehabilitation Services Secure chat preferred Office [phone]      Marguarite Arbour A Sabra Heck 02/07/2023, 12:15 PM

## 2023-02-08 ENCOUNTER — Inpatient Hospital Stay (HOSPITAL_COMMUNITY): Payer: Medicare Other

## 2023-02-08 DIAGNOSIS — I469 Cardiac arrest, cause unspecified: Secondary | ICD-10-CM | POA: Diagnosis not present

## 2023-02-08 LAB — GLUCOSE, CAPILLARY
Glucose-Capillary: 103 mg/dL — ABNORMAL HIGH (ref 70–99)
Glucose-Capillary: 107 mg/dL — ABNORMAL HIGH (ref 70–99)
Glucose-Capillary: 112 mg/dL — ABNORMAL HIGH (ref 70–99)
Glucose-Capillary: 146 mg/dL — ABNORMAL HIGH (ref 70–99)
Glucose-Capillary: 98 mg/dL (ref 70–99)
Glucose-Capillary: 99 mg/dL (ref 70–99)

## 2023-02-08 MED ORDER — TROLAMINE SALICYLATE 10 % EX CREA
TOPICAL_CREAM | Freq: Two times a day (BID) | CUTANEOUS | Status: DC | PRN
  Filled 2023-02-08: qty 85

## 2023-02-08 MED ORDER — LIDOCAINE HCL 1 % IJ SOLN
INTRAMUSCULAR | Status: AC
  Filled 2023-02-08: qty 20

## 2023-02-08 MED ORDER — MUSCLE RUB 10-15 % EX CREA
TOPICAL_CREAM | Freq: Two times a day (BID) | CUTANEOUS | Status: DC | PRN
  Filled 2023-02-08: qty 85

## 2023-02-08 MED ORDER — ENSURE ENLIVE PO LIQD
237.0000 mL | Freq: Two times a day (BID) | ORAL | Status: DC
  Administered 2023-02-10 (×2): 237 mL via ORAL

## 2023-02-08 MED ORDER — ACETAMINOPHEN 500 MG PO TABS: 1000.0000 mg | ORAL_TABLET | Freq: Four times a day (QID) | ORAL | Status: DC | PRN

## 2023-02-08 NOTE — Progress Notes (Signed)
Nutrition Follow-up  DOCUMENTATION CODES:   Severe malnutrition in context of chronic illness  INTERVENTION:  - Continue Dys 2 diet.   - Add Ensure Enlive po BID, each supplement provides 350 kcal and 20 grams of protein.  NUTRITION DIAGNOSIS:   Severe Malnutrition related to chronic illness (CHF) as evidenced by severe fat depletion, severe muscle depletion. - Ongoing   GOAL:   Patient will meet greater than or equal to 90% of their needs - not met, continue   MONITOR:   Labs, Vent status, TF tolerance  REASON FOR ASSESSMENT:   Ventilator, Consult Enteral/tube feeding initiation and management  ASSESSMENT:   Pt with hx of CHF, HTN, GERD, and PAF presented to ED after being found down with a contusion and deformities to the right side of the head. Suffered PEA arrest in ED and CPR x 9 minutes.  Meds reviewed: cozaar, remeron, aldactone. Labs reviewed: Na low, BUN elevated, alk phos elevated.   The pt was planned for a PEG tube today, however, pt declined. Pt with Cortrak tube in place but it is clamped and TF orders were discontinued. Pt will be unable to discharge to SNF with Cortrak. At last calorie count, pt was not meeting his needs. Will plan to see how pt does with PO diet and no TF currently running.   Diet Order:   Diet Order             Diet NPO time specified Except for: Sips with Meds  Diet effective midnight                   EDUCATION NEEDS:   Not appropriate for education at this time  Skin:  Skin Assessment: Skin Integrity Issues: Skin Integrity Issues:: Other (Comment) Other: laceration to head  Last BM:  3/25  Height:   Ht Readings from Last 1 Encounters:  01/18/23 5\' 11"  (1.803 m)    Weight:   Wt Readings from Last 1 Encounters:  02/06/23 58.6 kg    Ideal Body Weight:  78.2 kg  BMI:  Body mass index is 18.02 kg/m.  Estimated Nutritional Needs:   Kcal:  1900-2100 kcal/d  Protein:  95-105g/d  Fluid:  1.8-2L/d  Thalia Bloodgood, RD, LDN, CNSC.

## 2023-02-08 NOTE — Progress Notes (Signed)
Patient started dysphagia 2 diet and had 2/3 of the meal. Tolerated well.

## 2023-02-08 NOTE — TOC Progression Note (Signed)
Transition of Care [facility]) - Progression Note    Patient Details  Name: Curtis Ramos MRN: [id number] Date of Birth: 07-19-1932  Transition of Care [facility]) CM/SW Contact  Jinger Neighbors, [place] Phone Number: 02/08/2023, 10:29 AM  Clinical Narrative:     CSW reviewed bed offers and contacted pt's son, [name]. [name] prefers [facility]; CSW encouraged [name] to call [facility] to further discuss. CSW sent [name] a picture of the three bed offers. [name] will f/u with CSW regarding SNF preference.   Expected Discharge Plan: [place] Barriers to Discharge: Continued Medical Work up  Expected Discharge Plan and Services In-house Referral: Clinical Social Work Discharge Planning Services: CM Consult Post Acute Care Choice: Long Term Acute Care (LTAC) Living arrangements for the past 2 months: Single Family Home                                       Social Determinants of Health (SDOH) Interventions SDOH Screenings   Food Insecurity: No Food Insecurity (01/18/2023)  Housing: Low Risk  (01/18/2023)  Transportation Needs: No Transportation Needs (01/18/2023)  Utilities: Not At Risk (01/18/2023)  Financial Resource Strain: Low Risk  (01/18/2023)  Tobacco Use: Low Risk  (01/13/2023)    Readmission Risk Interventions     No data to display

## 2023-02-08 NOTE — Progress Notes (Signed)
Patient was taken for the procedure at [id number]

## 2023-02-08 NOTE — Progress Notes (Signed)
Occupational Therapy Treatment Patient Details Name: Curtis Ramos MRN: [id number] DOB: 01-27-1932 Today's Date: 02/08/2023   History of present illness Pt is 89 year old presented to [facility] on  01/13/23 after unwitnessed fall at work. In ED pt had PEA arrest with CPR x 9 minutes and intubated. Pt with respiratory failure and septic shock due to pulmonary edema, atelectasis post CPR rib/sternal fxs, PNA, and acute on chronic heart failure. Failed extubation 3/3 and reintubated until extubated 3/6. PMH - HTN, heart failure, afib, thyroid CA   OT comments  Pt is making great progress towards their acute OT goals. Upon arrival, pt reported bilateral foot pain and needed encouragement to participate. Overall he was able to get to the EOB with mod A. Stedy used this session and pt stood within the frame 6x with min A, marching in place 10x each stand with min G. 1-2 minute sitting rest break needed between each set. While standing pt was dependent for rear peri care as he requires BUE supported externally in standing. OT to continue to follow acutely to facilitate progress towards established goals. Pt will continue to benefit from skilled inpatient follow up therapy, <3 hours/day.     Recommendations for follow up therapy are one component of a multi-disciplinary discharge planning process, led by the attending physician.  Recommendations may be updated based on patient status, additional functional criteria and insurance authorization.    Assistance Recommended at Discharge Frequent or constant Supervision/Assistance  Patient can return home with the following  A lot of help with walking and/or transfers;A lot of help with bathing/dressing/bathroom;Assistance with cooking/housework;Direct supervision/assist for medications management;Assistance with feeding;Direct supervision/assist for financial management;Assist for transportation;Help with stairs or ramp for entrance   Equipment Recommendations   Other (comment)       Precautions / Restrictions Precautions Precautions: Fall;Other (comment) Precaution Comments: cortrak Restrictions Weight Bearing Restrictions: No       Mobility Bed Mobility Overal bed mobility: Needs Assistance Bed Mobility: Supine to Sit, Sit to Supine     Supine to sit: Mod assist Sit to supine: Max assist   General bed mobility comments: cues for sequencing    Transfers Overall transfer level: Needs assistance Equipment used: Ambulation equipment used Transfers: Sit to/from Stand Sit to Stand: Min assist, From elevated surface           General transfer comment: stood 6x in stedy frame with marching in place 10x each trial. No posterior bias noted.     Balance Overall balance assessment: Needs assistance Sitting-balance support: Feet supported, No upper extremity supported Sitting balance-Leahy Scale: Fair     Standing balance support: Bilateral upper extremity supported, During functional activity Standing balance-Leahy Scale: Poor                             ADL either performed or assessed with clinical judgement   ADL Overall ADL's : Needs assistance/impaired                         Toilet Transfer: Minimal assistance;Total assistance Toilet Transfer Details (indicate cue type and reason): simulated in stedy frame. min A to stand, dependent transfer [place] and Hygiene: Sit to/from stand;Total assistance Toileting - Clothing Manipulation Details (indicate cue type and reason): rear peri hygiene while standing in stedy frame       General ADL Comments: session focused on standing and activity tolerance.  Extremity/Trunk Assessment Upper Extremity Assessment Upper Extremity Assessment: Generalized weakness   Lower Extremity Assessment Lower Extremity Assessment: Defer to PT evaluation        Vision   Vision Assessment?: No apparent visual deficits Additional Comments:  WFL this date, glasses donned   Perception Perception Perception: Not tested   Praxis Praxis Praxis: Not tested    Cognition Arousal/Alertness: Awake/alert Behavior During Therapy: WFL for tasks assessed/performed Overall Cognitive Status: Within Functional Limits for tasks assessed                                 General Comments: cog was overall WFL for tasks addressed this date. encouragement and motivation needed to participate initally. Pt talking about his loved ones throughout        Exercises Exercises: Other exercises Other Exercises Other Exercises: sit<>stand 6x Other Exercises: marching in place 10 reps x6       General Comments VSS on RA, pt extremely appreciative of therapist. Skin break down with drainage noted on pt's spine. RN made aware.    Pertinent Vitals/ Pain       Pain Assessment Pain Assessment: Faces Faces Pain Scale: Hurts little more Pain Location: bilateral feet Pain Descriptors / Indicators: Discomfort, Guarding, Grimacing Pain Intervention(s): Limited activity within patient's tolerance, Monitored during session   Frequency  Min 2X/week        Progress Toward Goals  OT Goals(current goals can now be found in the care plan section)  Progress towards OT goals: Progressing toward goals  Acute Rehab OT Goals Patient Stated Goal: to get better OT Goal Formulation: With patient Time For Goal Achievement: 02/07/23 Potential to Achieve Goals: Good ADL Goals Pt Will Perform Eating: with supervision;sitting Pt Will Perform Grooming: sitting;with min assist Pt Will Perform Upper Body Dressing: sitting;with min assist Pt Will Perform Lower Body Dressing: sit to/from stand;with mod assist Pt Will Transfer to Toilet: stand pivot transfer;bedside commode;with mod assist  Plan Discharge plan remains appropriate       AM-PAC OT "6 Clicks" Daily Activity     Outcome Measure   Help from another person eating meals?: A  Little Help from another person taking care of personal grooming?: A Little Help from another person toileting, which includes using toliet, bedpan, or urinal?: A Lot Help from another person bathing (including washing, rinsing, drying)?: A Lot Help from another person to put on and taking off regular upper body clothing?: A Little Help from another person to put on and taking off regular lower body clothing?: A Lot 6 Click Score: 15    End of Session Equipment Utilized During Treatment: Gait belt  OT Visit Diagnosis: Unsteadiness on feet (R26.81);Other abnormalities of gait and mobility (R26.89);Muscle weakness (generalized) (M62.81);History of falling (Z91.81)   Activity Tolerance Patient tolerated treatment well   Patient Left in bed;with call bell/phone within reach;with bed alarm set   Nurse Communication Mobility status        Time: [id number] OT Time Calculation (min): 40 min  Charges: OT General Charges $OT Visit: 1 Visit OT Treatments $Therapeutic Activity: 38-52 mins  Shade Flood, OTR/L [place] Office [phone] Secure Chat Communication Preferred   Elliot Cousin 02/08/2023, 5:02 PM

## 2023-02-08 NOTE — Progress Notes (Signed)
SLP Cancellation Note  Patient Details Name: Curtis Ramos MRN: [id number] DOB: 12-30-1931   Cancelled treatment:        Pt NPO for PEG procedure today    Houston Siren 02/08/2023, 10:37 AM

## 2023-02-08 NOTE — Progress Notes (Signed)
PROGRESS NOTE    Curtis Ramos  [id number] DOB: [birth year] DOA: 01/13/2023 PCP: Leonides Sake, MD    Brief Narrative:  89 year old M with PMH of systolic CHF, NICM, A-fib on Eliquis, HTN, hypothyroidism, thyroid cancer and GERD presented to ED on 2/29 after unwitnessed fall at home.  As per the PCCM notes patient was found down between the radiator and the wall unresponsive with contusion.  On arrival to ED patient became apneic and lost pulses and cardiac monitoring showed PEA arrest.  CPR administered for 9 minutes with epi x 4 and ROSC achieved.  Patient was intubated and admitted to ICU. cardiology was consulted. CT of the chest abdomen and pelvis showed known displaced transverse fractures of the mid lower sternal body, acute fracture of the coastal cartilage of bilateral second through sixth ribs, acute fracture of the anterior second to 7 ribs. He was started on Unasyn for aspiration pneumonitis, later transitioned to cefepime for Pseudomonas VAP.     Patient was extubated on 01/19/2023, and transferred to [facility] on 01/21/2023. On 3/9, he became tachypneic and requiried BIPAP for 24 hours, and transitioned to HF oxygen.  Patient was weaned off oxygen to room air on 3/12.  Remains on TF via cortrack due to significant risk for aspiration.  Hospital course significant for delirium that seems to have resolved.   After discussion with family, IR was consulted for G-tube.  However, SLP reevaluated patient and advanced diet to dysphagia-1. Family wanted to hold on G-tube.   SLP advancing him further to dysphagia 2 diet.  Unfortunately, patient's p.o. intake is very poor.  After discussion with RD and patient's son, IR reconsulted for G-tube.   PEG tube planned today, he was taken down to the IR suite and he declined the procedure.   Assessment & Plan:   PEA cardiac arrest: 90 minutes CPR, 4 rounds of epi.  On shockable rhythm.  Rib fractures.  Currently off vasopressors.  He is on amiodarone  to suppress PVCs.  Severe protein calorie malnutrition, oropharyngeal dysphagia: Prolonged hospital admission.  Currently on Dobbhoff tube feeding.  Declined PEG tube placement today.  Wants to try eating. Continue dysphagia 2 diet with nectar thick liquids, family to encourage patient to eat and stay at the bedside.  Calorie count.  Hold tube feeding today.  Reengage palliative care team, if he declines PEG tube, he may need to be palliation or hospice.  Acute urinary retention: Resolved Abnormal LFTs: Resolved Delirium: Resolved. Ventilator associated pneumonia: Improved.  Off antibiotics. Acute respiratory failure with hypoxemia: Resolved.  On room air now. Septic shock: Resolved. Hypothyroidism: On Synthroid 175 mcg. Chronic systolic heart failure: Stable now.  On losartan, amiodarone, spironolactone.  Euvolemic. Acute kidney injury: Resolved.  Normalized.     DVT prophylaxis: enoxaparin (LOVENOX) injection 40 mg Start: 02/08/23 2200 SCDs Start: 01/13/23 1327   Code Status: Full code Family Communication: Patient's son at the bedside Disposition Plan: Status is: Inpatient Remains inpatient appropriate because: Patient is unable to eat     Consultants:  GI IR Critical care Cardiology  Procedures:  Multiple as above  Antimicrobials:  Completed antibiotic therapy   Subjective: Patient seen in the morning rounds.  He was pleasant.  He told me "really do not want to do anything that may not help me".  He agreed to discuss with radiologist about PEG tube.  Patient went down to IR suite and he declined.  Came back to talk to the patient.  Son at the  bedside.  Patient tells me "I may not be ready to undergo that procedure" I will think about it.  We decided to give him feeding trial and see how he does.  Continue IV fluids today. Will re-engage palliative care team. Patient also complains of foot pain on walking.  No swelling or redness.  Please advise  Objective: Vitals:    02/07/23 1552 02/07/23 1958 02/08/23 0546 02/08/23 0753  BP: 110/63 110/62 102/83 110/62  Pulse: 75 77 79 76  Resp: 18 16 18 18   Temp: 98.5 F (36.9 C) (!) 97.4 F (36.3 C) 97.8 F (36.6 C) 98.3 F (36.8 C)  TempSrc: Oral Oral  Oral  SpO2: 99% 94% 95% 95%  Weight:      Height:        Intake/Output Summary (Last 24 hours) at 02/08/2023 1338 Last data filed at 02/08/2023 1134 Gross per 24 hour  Intake 478 ml  Output 1700 ml  Net -1222 ml   Filed Weights   02/04/23 0326 02/05/23 0456 02/06/23 0500  Weight: 56.4 kg 58.4 kg 58.6 kg    Examination:  General exam: Appears calm and comfortable  Slightly anxious. Patient is alert awake and oriented to self and situation. Respiratory system: No added Cardiovascular system: S1 & S2 heard, RRR. No pedal edema. Gastrointestinal system: Soft.  Nontender.  Bowel sound present.   Central nervous system: Alert and oriented. No focal neurological deficits.  Can move all extremities equally. Extremities: Symmetric 5 x 5 power.    Data Reviewed: I have personally reviewed following labs and imaging studies  CBC: Recent Labs  Lab 02/03/23 0536 02/04/23 1307 02/05/23 0117 02/07/23 0455  WBC 13.0* 13.1* 13.3* 11.2*  NEUTROABS  --   --   --  8.8*  HGB 10.9* 11.3* 11.0* 10.1*  HCT 32.7* 34.1* 32.0* 31.2*  MCV 110.8* 111.8* 113.9* 112.6*  PLT 419* 374 347 [id number]   Basic Metabolic Panel: Recent Labs  Lab 02/03/23 0536 02/03/23 0537 02/05/23 0117 02/07/23 0455  NA  --  138 137 132*  K  --  4.2 4.3 4.3  CL  --  107 108 103  CO2  --  24 23 23   GLUCOSE  --  98 107* 119*  BUN  --  33* 37* 30*  CREATININE  --  1.10 1.09 0.94  CALCIUM  --  10.3 10.0 10.2  MG 2.2  --  1.9  --   PHOS  --  2.9 2.4*  --    GFR: Estimated Creatinine Clearance: 45 mL/min (by C-G formula based on SCr of 0.94 mg/dL). Liver Function Tests: Recent Labs  Lab 02/03/23 0537 02/05/23 0117 02/07/23 0455  AST  --   --  24  ALT  --   --  39  ALKPHOS   --   --  153*  BILITOT  --   --  0.7  PROT  --   --  7.0  ALBUMIN 2.7* 2.6* 2.4*   No results for input(s): "LIPASE", "AMYLASE" in the last 168 hours. No results for input(s): "AMMONIA" in the last 168 hours. Coagulation Profile: Recent Labs  Lab 02/07/23 0455  INR 1.3*   Cardiac Enzymes: No results for input(s): "CKTOTAL", "CKMB", "CKMBINDEX", "TROPONINI" in the last 168 hours. BNP (last 3 results) Recent Labs    06/18/22 1331 10/05/22 0848  PROBNP 1,795* 3,434*   HbA1C: No results for input(s): "HGBA1C" in the last 72 hours. CBG: Recent Labs  Lab 02/07/23 2003 02/08/23 0047 02/08/23  0545 02/08/23 0735 02/08/23 1158  GLUCAP 98 146* 103* 107* 98   Lipid Profile: No results for input(s): "CHOL", "HDL", "LDLCALC", "TRIG", "CHOLHDL", "LDLDIRECT" in the last 72 hours. Thyroid Function Tests: No results for input(s): "TSH", "T4TOTAL", "FREET4", "T3FREE", "THYROIDAB" in the last 72 hours. Anemia Panel: No results for input(s): "VITAMINB12", "FOLATE", "FERRITIN", "TIBC", "IRON", "RETICCTPCT" in the last 72 hours. Sepsis Labs: No results for input(s): "PROCALCITON", "LATICACIDVEN" in the last 168 hours.  No results found for this or any previous visit (from the past 240 hour(s)).       Radiology Studies: No results found.      Scheduled Meds:  amiodarone  200 mg Oral Daily   enoxaparin (LOVENOX) injection  40 mg Subcutaneous Q24H   levothyroxine  175 mcg Oral Q0600   lidocaine  1 patch Transdermal Q24H   lidocaine       losartan  12.5 mg Oral Daily   mirtazapine  7.5 mg Oral QHS   mouth rinse  15 mL Mouth Rinse 4 times per day   pantoprazole (PROTONIX) IV  40 mg Intravenous Q12H   spironolactone  12.5 mg Oral Daily   Continuous Infusions:  sodium chloride Stopped (01/25/23 1632)    ceFAZolin (ANCEF) IV     dextrose 5% lactated ringers 75 mL/hr at 02/08/23 1213     LOS: 26 days    Time spent: 40 minutes    Barb Merino, MD [facility] Pager [phone]

## 2023-02-09 ENCOUNTER — Inpatient Hospital Stay (HOSPITAL_COMMUNITY): Payer: Medicare Other

## 2023-02-09 DIAGNOSIS — I469 Cardiac arrest, cause unspecified: Secondary | ICD-10-CM | POA: Diagnosis not present

## 2023-02-09 HISTORY — PX: IR GASTROSTOMY TUBE MOD SED: IMG625

## 2023-02-09 LAB — GLUCOSE, CAPILLARY
Glucose-Capillary: 94 mg/dL (ref 70–99)
Glucose-Capillary: 109 mg/dL — ABNORMAL HIGH (ref 70–99)
Glucose-Capillary: 92 mg/dL (ref 70–99)
Glucose-Capillary: 90 mg/dL (ref 70–99)

## 2023-02-09 MED ORDER — IOHEXOL 300 MG/ML  SOLN: 50.0000 mL | Freq: Once | INTRAMUSCULAR | Status: DC | PRN

## 2023-02-09 MED ORDER — IOHEXOL 300 MG/ML  SOLN
50.0000 mL | Freq: Once | INTRAMUSCULAR | Status: AC | PRN
  Administered 2023-02-09: 20 mL

## 2023-02-09 MED ORDER — FENTANYL CITRATE (PF) 100 MCG/2ML IJ SOLN
INTRAMUSCULAR | Status: AC
  Filled 2023-02-09: qty 2

## 2023-02-09 MED ORDER — LIDOCAINE HCL 1 % IJ SOLN
INTRAMUSCULAR | Status: AC
  Filled 2023-02-09: qty 20

## 2023-02-09 MED ORDER — MIDAZOLAM HCL 2 MG/2ML IJ SOLN
INTRAMUSCULAR | Status: AC | PRN
  Administered 2023-02-09: .5 mg via INTRAVENOUS
  Administered 2023-02-09: 1 mg via INTRAVENOUS
  Administered 2023-02-09 (×2): .5 mg via INTRAVENOUS

## 2023-02-09 MED ORDER — GLUCAGON HCL (RDNA) 1 MG IJ SOLR
INTRAMUSCULAR | Status: AC | PRN
  Administered 2023-02-09: 1 mg via INTRAVENOUS

## 2023-02-09 MED ORDER — MIDAZOLAM HCL 2 MG/2ML IJ SOLN
INTRAMUSCULAR | Status: AC
  Filled 2023-02-09: qty 2

## 2023-02-09 MED ORDER — GLUCAGON HCL RDNA (DIAGNOSTIC) 1 MG IJ SOLR
INTRAMUSCULAR | Status: AC
  Filled 2023-02-09: qty 1

## 2023-02-09 MED ORDER — FENTANYL CITRATE (PF) 100 MCG/2ML IJ SOLN
INTRAMUSCULAR | Status: AC | PRN
  Administered 2023-02-09 (×4): 25 ug via INTRAVENOUS

## 2023-02-09 MED ORDER — CEFAZOLIN SODIUM-DEXTROSE 2-4 GM/100ML-% IV SOLN
INTRAVENOUS | Status: AC
  Administered 2023-02-09: 2000 mg
  Filled 2023-02-09: qty 100

## 2023-02-09 MED ORDER — SODIUM CHLORIDE 0.9 % IV SOLN
INTRAVENOUS | Status: AC | PRN
  Administered 2023-02-09: 50 mL/h via INTRAVENOUS

## 2023-02-09 NOTE — Progress Notes (Signed)
SLP Cancellation Note  Patient Details Name: Curtis Ramos MRN: [id number] DOB: [birth year]   Cancelled treatment:       Reason Eval/Treat Not Completed: Medical issues which prohibited therapy (Pt's RN reported that the pt is now NPO for G-tube placement. Pt refused procedure yesterday, but is reportedly now amenable. No NPO order noted in the system; SLP will modify pt's current diet order from dysphagia 1 with thin liquids to dysphagia 2 and nectar thick liquid as has been SLP's recommendation.)  Carys Malina I. Hardin Negus, [place], [place] Office number [phone]  [name] 02/09/2023, 9:10 AM

## 2023-02-09 NOTE — Progress Notes (Signed)
PROGRESS NOTE    Curtis Ramos  [id number] DOB: 10-27-1932 DOA: 01/13/2023 PCP: Leonides Sake, MD    Brief Narrative:  89 year old M with PMH of systolic CHF, NICM, A-fib on Eliquis, HTN, hypothyroidism, thyroid cancer and GERD presented to ED on 2/29 after unwitnessed fall at home.  As per the PCCM notes patient was found down between the radiator and the wall unresponsive with contusion.  On arrival to ED patient became apneic and lost pulses and cardiac monitoring showed PEA arrest.  CPR administered for 9 minutes with epi x 4 and ROSC achieved.  Patient was intubated and admitted to ICU. cardiology was consulted. CT of the chest abdomen and pelvis showed known displaced transverse fractures of the mid lower sternal body, acute fracture of the coastal cartilage of bilateral second through sixth ribs, acute fracture of the anterior second to 7 ribs. He was started on Unasyn for aspiration pneumonitis, later transitioned to cefepime for Pseudomonas VAP.     Patient was extubated on 01/19/2023, and transferred to [facility] on 01/21/2023. On 3/9, he became tachypneic and requiried BIPAP for 24 hours, and transitioned to HF oxygen.  Patient was weaned off oxygen to room air on 3/12.  Remains on TF via cortrack due to significant risk for aspiration.  Hospital course significant for delirium that seems to have resolved.   After discussion with family, IR was consulted for G-tube.  However, SLP reevaluated patient and advanced diet to dysphagia-1. Family wanted to hold on G-tube.   SLP advancing him further to dysphagia 2 diet.  Unfortunately, patient's p.o. intake is very poor.  After discussion with RD and patient's son, IR reconsulted for G-tube. 3/26, patient declined PEG tube 3/27, patient agreed for PEG tube.  Is scheduled for today.   Assessment & Plan:   PEA cardiac arrest: 90 minutes CPR, 4 rounds of epi.  Non shockable rhythm.  Rib fractures.  Currently off vasopressors.  He is on amiodarone  to suppress PVCs.  Severe protein calorie malnutrition, oropharyngeal dysphagia: Prolonged hospital admission.  Currently on Dobbhoff tube feeding.  Declined PEG tube placement yesterday but agrees today. Given severity of clinical situation, he may benefit with PEG tube for now and placement to rehab.  However in the future if he eats well this can be discontinued. If he declines PEG tube feeding, will take the Dobbhoff tube out and let him eat and send him to rehab.  Acute urinary retention: Resolved Abnormal LFTs: Resolved Delirium: Resolved. Ventilator associated pneumonia: Improved.  Off antibiotics. Acute respiratory failure with hypoxemia: Resolved.  On room air now. Septic shock: Resolved. Hypothyroidism: On Synthroid 175 mcg. Chronic systolic heart failure: Stable now.  On losartan, amiodarone, spironolactone.  Euvolemic. Acute kidney injury: Resolved.  Normalized.  Discharge to a skilled nursing facility after PEG tube placement.   DVT prophylaxis: enoxaparin (LOVENOX) injection 40 mg Start: 02/08/23 2200 SCDs Start: 01/13/23 1327   Code Status: Full code Family Communication: Patient's son at the bedside 3/26 Disposition Plan: Status is: Inpatient Remains inpatient appropriate because: Patient is unable to eat     Consultants:  GI IR Critical care Cardiology  Procedures:  Multiple as above  Antimicrobials:  Completed antibiotic therapy   Subjective:  Patient seen and examined.  No overnight events.  Patient tells me that today he thinks he should procedure done.  He thinks he is ready for it.  Patient tells me that he changed his mind and he even remembers talking to me yesterday about  not doing the procedure. IR in communication with family.  Objective: Vitals:   02/08/23 1946 02/09/23 0500 02/09/23 0504 02/09/23 0746  BP: (!) 124/54  110/60 116/61  Pulse: 76  78 75  Resp: 16  18 18   Temp: 98.7 F (37.1 C)  98.2 F (36.8 C) (!) 97.3 F (36.3 C)   TempSrc: Oral  Oral Oral  SpO2: 96%  94% 96%  Weight:  58.6 kg    Height:        Intake/Output Summary (Last 24 hours) at 02/09/2023 1050 Last data filed at 02/09/2023 0500 Gross per 24 hour  Intake --  Output 1350 ml  Net -1350 ml    Filed Weights   02/05/23 0456 02/06/23 0500 02/09/23 0500  Weight: 58.4 kg 58.6 kg 58.6 kg    Examination:  General exam: Appears calm and comfortable .  Debilitated and frail. Patient is alert awake and oriented to self and situation. Respiratory system: No added Cardiovascular system: S1 & S2 heard, RRR. No pedal edema. Gastrointestinal system: Soft.  Nontender.  Bowel sound present.   Central nervous system: Alert and oriented. No focal neurological deficits.  Can move all extremities equally. Extremities: Symmetric 5 x 5 power.    Data Reviewed: I have personally reviewed following labs and imaging studies  CBC: Recent Labs  Lab 02/03/23 0536 02/04/23 1307 02/05/23 0117 02/07/23 0455  WBC 13.0* 13.1* 13.3* 11.2*  NEUTROABS  --   --   --  8.8*  HGB 10.9* 11.3* 11.0* 10.1*  HCT 32.7* 34.1* 32.0* 31.2*  MCV 110.8* 111.8* 113.9* 112.6*  PLT 419* 374 347 [id number]    Basic Metabolic Panel: Recent Labs  Lab 02/03/23 0536 02/03/23 0537 02/05/23 0117 02/07/23 0455  NA  --  138 137 132*  K  --  4.2 4.3 4.3  CL  --  107 108 103  CO2  --  24 23 23   GLUCOSE  --  98 107* 119*  BUN  --  33* 37* 30*  CREATININE  --  1.10 1.09 0.94  CALCIUM  --  10.3 10.0 10.2  MG 2.2  --  1.9  --   PHOS  --  2.9 2.4*  --     GFR: Estimated Creatinine Clearance: 45 mL/min (by C-G formula based on SCr of 0.94 mg/dL). Liver Function Tests: Recent Labs  Lab 02/03/23 0537 02/05/23 0117 02/07/23 0455  AST  --   --  24  ALT  --   --  39  ALKPHOS  --   --  153*  BILITOT  --   --  0.7  PROT  --   --  7.0  ALBUMIN 2.7* 2.6* 2.4*    No results for input(s): "LIPASE", "AMYLASE" in the last 168 hours. No results for input(s): "AMMONIA" in the last  168 hours. Coagulation Profile: Recent Labs  Lab 02/07/23 0455  INR 1.3*    Cardiac Enzymes: No results for input(s): "CKTOTAL", "CKMB", "CKMBINDEX", "TROPONINI" in the last 168 hours. BNP (last 3 results) Recent Labs    06/18/22 1331 10/05/22 0848  PROBNP 1,795* 3,434*    HbA1C: No results for input(s): "HGBA1C" in the last 72 hours. CBG: Recent Labs  Lab 02/08/23 1158 02/08/23 1622 02/08/23 2109 02/09/23 0504 02/09/23 0731  GLUCAP 98 112* 99 94 109*    Lipid Profile: No results for input(s): "CHOL", "HDL", "LDLCALC", "TRIG", "CHOLHDL", "LDLDIRECT" in the last 72 hours. Thyroid Function Tests: No results for input(s): "TSH", "T4TOTAL", "FREET4", "T3FREE", "  THYROIDAB" in the last 72 hours. Anemia Panel: No results for input(s): "VITAMINB12", "FOLATE", "FERRITIN", "TIBC", "IRON", "RETICCTPCT" in the last 72 hours. Sepsis Labs: No results for input(s): "PROCALCITON", "LATICACIDVEN" in the last 168 hours.  No results found for this or any previous visit (from the past 240 hour(s)).       Radiology Studies: No results found.      Scheduled Meds:  amiodarone  200 mg Oral Daily   enoxaparin (LOVENOX) injection  40 mg Subcutaneous Q24H   feeding supplement  237 mL Oral BID BM   levothyroxine  175 mcg Oral Q0600   lidocaine  1 patch Transdermal Q24H   losartan  12.5 mg Oral Daily   mirtazapine  7.5 mg Oral QHS   mouth rinse  15 mL Mouth Rinse 4 times per day   pantoprazole (PROTONIX) IV  40 mg Intravenous Q12H   spironolactone  12.5 mg Oral Daily   Continuous Infusions:  sodium chloride Stopped (01/25/23 1632)     LOS: 27 days    Time spent: 40 minutes    Barb Merino, MD [facility] Pager [phone]

## 2023-02-09 NOTE — TOC Progression Note (Addendum)
Transition of Care [facility]) - Progression Note    Patient Details  Name: Curtis Ramos MRN: [id number] Date of Birth: 11/19/1931  Transition of [facility] CM/SW Contact  Jinger Neighbors, [place] Phone Number: 02/09/2023, 8:38 AM  Clinical Narrative:     CSW received a message from [name] asking for referral to be sent to [facility]. CSW resent referral to [facility] via the hubb.  CSW received a message from [facility] stating pt has been denied. CSW messaged pt's son to make him aware of denial and informed him a decision will need to be made today on the facilities that extended a bed offer.   Expected Discharge Plan: [place] Barriers to Discharge: Continued Medical Work up  Expected Discharge Plan and Services In-house Referral: Clinical Social Work Discharge Planning Services: CM Consult Post Acute Care Choice: Long Term Acute Care (LTAC) Living arrangements for the past 2 months: Single Family Home                                       Social Determinants of Health (SDOH) Interventions SDOH Screenings   Food Insecurity: No Food Insecurity (01/18/2023)  Housing: Low Risk  (01/18/2023)  Transportation Needs: No Transportation Needs (01/18/2023)  Utilities: Not At Risk (01/18/2023)  Financial Resource Strain: Low Risk  (01/18/2023)  Tobacco Use: Low Risk  (01/13/2023)    Readmission Risk Interventions     No data to display

## 2023-02-09 NOTE — Procedures (Signed)
Interventional Radiology Procedure:   Indications: Dysphagia  Procedure: Gastrostomy tube placement  Findings: 16 Fr balloon retention tube placed along with a T-fastener.  Contrast injection confirmed placement in stomach.  Complications: None     EBL: Minimal  Plan: Start tube feeds in 4 hours.  Plan to remove T-fastener prior to discharge.    Curtis Nash R. Anselm Pancoast, MD  Pager: [phone]

## 2023-02-10 ENCOUNTER — Inpatient Hospital Stay (HOSPITAL_COMMUNITY): Payer: Medicare Other

## 2023-02-10 DIAGNOSIS — I469 Cardiac arrest, cause unspecified: Secondary | ICD-10-CM | POA: Diagnosis not present

## 2023-02-10 LAB — GLUCOSE, CAPILLARY
Glucose-Capillary: 102 mg/dL — ABNORMAL HIGH (ref 70–99)
Glucose-Capillary: 84 mg/dL (ref 70–99)
Glucose-Capillary: 78 mg/dL (ref 70–99)
Glucose-Capillary: 112 mg/dL — ABNORMAL HIGH (ref 70–99)
Glucose-Capillary: 118 mg/dL — ABNORMAL HIGH (ref 70–99)
Glucose-Capillary: 124 mg/dL — ABNORMAL HIGH (ref 70–99)

## 2023-02-10 LAB — MAGNESIUM
Magnesium: 2.1 mg/dL (ref 1.7–2.4)
Magnesium: 2.2 mg/dL (ref 1.7–2.4)

## 2023-02-10 LAB — CREATININE, SERUM
Creatinine, Ser: 1.2 mg/dL (ref 0.61–1.24)
GFR, Estimated: 58 mL/min — ABNORMAL LOW (ref >60)

## 2023-02-10 LAB — PHOSPHORUS
Phosphorus: 3.2 mg/dL (ref 2.5–4.6)
Phosphorus: 2.8 mg/dL (ref 2.5–4.6)

## 2023-02-10 MED ORDER — FREE WATER
100.0000 mL | Freq: Every day | Status: DC
  Administered 2023-02-10 – 2023-02-11 (×6): 100 mL

## 2023-02-10 MED ORDER — OSMOLITE 1.5 CAL PO LIQD
237.0000 mL | Freq: Every day | ORAL | Status: DC
  Administered 2023-02-10 – 2023-02-11 (×6): 237 mL
  Filled 2023-02-10 (×11): qty 237

## 2023-02-10 NOTE — Progress Notes (Signed)
PROGRESS NOTE    Curtis Ramos  [id number] DOB: 1932-03-10 DOA: 01/13/2023 PCP: Leonides Sake, MD    Brief Narrative:  89 year old M with PMH of systolic CHF, NICM, A-fib on Eliquis, HTN, hypothyroidism, thyroid cancer and GERD presented to ED on 2/29 after unwitnessed fall at home.  As per the PCCM notes patient was found down between the radiator and the wall unresponsive with contusion.  On arrival to ED patient became apneic and lost pulses and cardiac monitoring showed PEA arrest.  CPR administered for 9 minutes with epi x 4 and ROSC achieved.  Patient was intubated and admitted to ICU. cardiology was consulted. CT of the chest abdomen and pelvis showed known displaced transverse fractures of the mid lower sternal body, acute fracture of the coastal cartilage of bilateral second through sixth ribs, acute fracture of the anterior second to 7 ribs. He was started on Unasyn for aspiration pneumonitis, later transitioned to cefepime for Pseudomonas VAP.     Patient was extubated on 01/19/2023, and transferred to [facility] on 01/21/2023. On 3/9, he became tachypneic and requiried BIPAP for 24 hours, and transitioned to HF oxygen.  Patient was weaned off oxygen to room air on 3/12.  Remains on TF via cortrack due to significant risk for aspiration.  Hospital course significant for delirium that seems to have resolved.   After discussion with family, IR was consulted for G-tube.  However, SLP reevaluated patient and advanced diet to dysphagia-1. Family wanted to hold on G-tube.   SLP advancing him further to dysphagia 2 diet.  Unfortunately, patient's p.o. intake is very poor.  After discussion with RD and patient's son, IR reconsulted for G-tube. 3/26, patient declined PEG tube 3/27, patient agreed so received a PEG tube.  Uneventful.  Medically stable today.   Assessment & Plan:   PEA cardiac arrest: 90 minutes CPR, 4 rounds of epi.  Non shockable rhythm.  Rib fractures.  Currently off  vasopressors.  He is on amiodarone to suppress PVCs.  Severe protein calorie malnutrition, oropharyngeal dysphagia: He has some improvement of intake today, ST following. Now on dysphagia 2 diet with nectar thick liquids.  He can probably go on regular diet.  He underwent Peg tube placement, start tube feeding.   Acute urinary retention: Resolved Abnormal LFTs: Resolved Delirium: Resolved. Ventilator associated pneumonia: Improved.  Off antibiotics. Acute respiratory failure with hypoxemia: Resolved.  On room air now. Septic shock: Resolved. Hypothyroidism: On Synthroid 175 mcg. Chronic systolic heart failure: Stable now.  On losartan, amiodarone, spironolactone.  Euvolemic. Acute kidney injury: Resolved.  Normalized.  Discharge to a skilled nursing facility when available.    DVT prophylaxis: enoxaparin (LOVENOX) injection 40 mg Start: 02/08/23 2200 SCDs Start: 01/13/23 1327   Code Status: Full code Family Communication: Patient's son at the bedside 3/26 Disposition Plan: Status is: Inpatient Remains inpatient appropriate because: medically stable, waiting for SNF bed. Can be discharged      Consultants:  GI IR Critical care Cardiology  Procedures:  Multiple as above  Antimicrobials:  Completed antibiotic therapy   Subjective:  Patient seen and examined.  Working with speech therapy.  He tells me that he can drink regular liquids.   patient denies any complaints.  Talking about PEG tube, he tells me that what ever we decided and did procedure on him, he is happy with it.  Having very pleasant conversation today.  Objective: Vitals:   02/09/23 2057 02/10/23 0422 02/10/23 0500 02/10/23 0738  BP: 125/74 117/71  Marland Kitchen)  120/58  Pulse: 73 79  75  Resp: 17 17    Temp: 98.4 F (36.9 C) 98.3 F (36.8 C)  (!) 97.3 F (36.3 C)  TempSrc: Oral   Oral  SpO2:  95%  97%  Weight:   58.2 kg   Height:       No intake or output data in the 24 hours ending 02/10/23  1219  Filed Weights   02/06/23 0500 02/09/23 0500 02/10/23 0500  Weight: 58.6 kg 58.6 kg 58.2 kg    Examination:  General exam: Appears calm and comfortable .  Mostly alert awake and oriented.  Full of sense of humor. Respiratory system: No added Cardiovascular system: S1 & S2 heard, RRR. No pedal edema. Gastrointestinal system: Soft.  Nontender.  Bowel sound present.  PEG tube in place.  Dry and intact. Central nervous system: Alert and oriented. No focal neurological deficits.  Can move all extremities equally. Extremities: Symmetric 5 x 5 power.    Data Reviewed: I have personally reviewed following labs and imaging studies  CBC: Recent Labs  Lab 02/04/23 1307 02/05/23 0117 02/07/23 0455  WBC 13.1* 13.3* 11.2*  NEUTROABS  --   --  8.8*  HGB 11.3* 11.0* 10.1*  HCT 34.1* 32.0* 31.2*  MCV 111.8* 113.9* 112.6*  PLT 374 347 [id number]    Basic Metabolic Panel: Recent Labs  Lab 02/05/23 0117 02/07/23 0455 02/10/23 0753  NA 137 132*  --   K 4.3 4.3  --   CL 108 103  --   CO2 23 23  --   GLUCOSE 107* 119*  --   BUN 37* 30*  --   CREATININE 1.09 0.94 1.20  CALCIUM 10.0 10.2  --   MG 1.9  --  2.1  PHOS 2.4*  --  3.2    GFR: Estimated Creatinine Clearance: 35 mL/min (by C-G formula based on SCr of 1.2 mg/dL). Liver Function Tests: Recent Labs  Lab 02/05/23 0117 02/07/23 0455  AST  --  24  ALT  --  39  ALKPHOS  --  153*  BILITOT  --  0.7  PROT  --  7.0  ALBUMIN 2.6* 2.4*    No results for input(s): "LIPASE", "AMYLASE" in the last 168 hours. No results for input(s): "AMMONIA" in the last 168 hours. Coagulation Profile: Recent Labs  Lab 02/07/23 0455  INR 1.3*    Cardiac Enzymes: No results for input(s): "CKTOTAL", "CKMB", "CKMBINDEX", "TROPONINI" in the last 168 hours. BNP (last 3 results) Recent Labs    06/18/22 1331 10/05/22 0848  PROBNP 1,795* 3,434*    HbA1C: No results for input(s): "HGBA1C" in the last 72 hours. CBG: Recent Labs  Lab  02/09/23 2054 02/10/23 0116 02/10/23 0424 02/10/23 0739 02/10/23 1205  GLUCAP 90 102* 84 78 112*    Lipid Profile: No results for input(s): "CHOL", "HDL", "LDLCALC", "TRIG", "CHOLHDL", "LDLDIRECT" in the last 72 hours. Thyroid Function Tests: No results for input(s): "TSH", "T4TOTAL", "FREET4", "T3FREE", "THYROIDAB" in the last 72 hours. Anemia Panel: No results for input(s): "VITAMINB12", "FOLATE", "FERRITIN", "TIBC", "IRON", "RETICCTPCT" in the last 72 hours. Sepsis Labs: No results for input(s): "PROCALCITON", "LATICACIDVEN" in the last 168 hours.  No results found for this or any previous visit (from the past 240 hour(s)).       Radiology Studies: IR GASTROSTOMY TUBE MOD SED  Result Date: 02/09/2023 INDICATION: 89 year-old with oropharyngeal dysphagia. EXAM: PERCUTANEOUS GASTROSTOMY TUBE WITH FLUOROSCOPIC GUIDANCE Physician: Stephan Minister. Anselm Pancoast, MD MEDICATIONS: Ancef 2  g; Antibiotics were administered within 1 hour of the procedure. Glucagon 1 mg ANESTHESIA/SEDATION: Moderate (conscious) sedation was employed during this procedure. A total of Versed 2mg  and fentanyl 100 mcg was administered intravenously at the order of the provider performing the procedure. Total intra-service moderate sedation time: 42 minutes. Patient's level of consciousness and vital signs were monitored continuously by radiology nurse throughout the procedure under the supervision of the provider performing the procedure. FLUOROSCOPY: Radiation Exposure Index (as provided by the fluoroscopic device): 40 mGy Kerma COMPLICATIONS: None immediate. PROCEDURE: The procedure was explained to the patient. The risks and benefits of the procedure were discussed and the patient's questions were addressed. Informed consent was obtained from the patient. The patient was placed on the interventional table. An orogastric tube was placed with fluoroscopic guidance. Stomach was insufflated through the orogastric tube. Small percutaneous  window was identified along the abdominal midline. The anterior abdomen was prepped and draped in sterile fashion. Maximal barrier sterile technique was utilized including caps, mask, sterile gowns, sterile gloves, sterile drape, hand hygiene and skin antiseptic. The patient was biting on the orogastric tube and the orogastric tube had to be removed. Attempted to place another orogastric tube but the tube was preferentially going down the airway and another orogastric tube could not be placed. Therefore, the existing nasogastric tube was pulled back into the stomach body. The stomach was insufflated with air. The skin and subcutaneous tissues were anesthetized with 1% lidocaine. A needle was directed into the distended stomach with fluoroscopic guidance. A wire was advanced into the stomach and a T fastener was deployed. An incision was made adjacent to the T fastener. The needle was directed back into the stomach using fluoroscopic guidance and contrast was injected to confirm placement in the stomach. A stiff Amplatz wire was advanced into the stomach. Tract was dilated to accommodate an 18 French peel-away sheath. 59 French Entuit gastrostomy tube was easily advanced over the wire and the peel-away sheath was removed. Balloon was inflated with 7 mL of saline. Contrast injection confirmed placement in the stomach. T fastener is still intact. Gastrostomy tube was flushed with saline and a dressing was placed. IMPRESSION: Successful fluoroscopic guided percutaneous gastrostomy tube placement. Patient has a 58 French balloon retention gastrostomy tube. Electronically Signed   By: Markus Daft M.D.   On: 02/09/2023 14:36        Scheduled Meds:  amiodarone  200 mg Oral Daily   enoxaparin (LOVENOX) injection  40 mg Subcutaneous Q24H   feeding supplement  237 mL Oral BID BM   feeding supplement (OSMOLITE 1.5 CAL)  237 mL Per Tube 6 X Daily   free water  100 mL Per Tube 6 X Daily   levothyroxine  175 mcg Oral  Q0600   lidocaine  1 patch Transdermal Q24H   losartan  12.5 mg Oral Daily   mirtazapine  7.5 mg Oral QHS   mouth rinse  15 mL Mouth Rinse 4 times per day   pantoprazole (PROTONIX) IV  40 mg Intravenous Q12H   spironolactone  12.5 mg Oral Daily   Continuous Infusions:  sodium chloride Stopped (01/25/23 1632)     LOS: 28 days    Time spent: 40 minutes    Barb Merino, MD [facility] Pager [phone]

## 2023-02-10 NOTE — Progress Notes (Signed)
Speech Language Pathology Treatment: Dysphagia  Patient Details Name: Curtis Ramos MRN: [id number] DOB: [birth year] Today's Date: 02/10/2023 Time: [id number] SLP Time Calculation (min) (ACUTE ONLY): 25 min  Assessment / Plan / Recommendation Clinical Impression  Pt was seen for dysphagia treatment during part of breakfast. He consumed a meal of scrambled eggs, ground sausage, oatmeal and trials of thin liquids via straw. Pt demonstrated throat clearing once when he was prompted to use consecutive swallows of thin liquids via straw, but otherwise tolerated solids and thin and nectar thick liquids without overt s/s of aspiration. Pt reported use of compensatory strategies including post-swallow cough followed by dry swallow. However, this was not consistently observed during the meal. A modified barium swallow study is recommended to re-assess swallow function in an effort to determine whether nectar thick liquids are still clinically indicated; it is scheduled for today at 1330.    HPI HPI: 89 year old man who presented to [facility] ED 2/29 via EMS after unwitnessed fall at work, patient did not have any complaints prior to fall. He was found down between a radiator and the wall, unresponsive with contusion and deformities to R side of his head. Initially with tachycardic with EMS, then bradycardic. ST elevation noted on EKG and Code STEMI was called, later called off as ST elevation had resolved. Patient lost pulses while in ED and became apneic with cardiac monitoring showing PEA arrest. CPR administered x 9 minutes with Epi x 4 and ROSC, patient never had a shockable rhythm. Intubated peri-arrest 2/28-3/3 then reintubated until 3/6.Marland KitchenPMHx significant for HTN, HFrEF with NICM (cMRI 12/2022 with severe LV dilation, EF 17%, LBBB, followed by Dr. Haroldine Laws), AFib (on Eliquis), MAI infection, GERD, hypothyroidism, history of thyroid CA.      SLP Plan  Continue with current plan of care;MBS      Recommendations  for follow up therapy are one component of a multi-disciplinary discharge planning process, led by the attending physician.  Recommendations may be updated based on patient status, additional functional criteria and insurance authorization.    Recommendations  Diet recommendations: Dysphagia 2 (fine chop);Nectar-thick liquid Liquids provided via: Cup;Straw Medication Administration: Crushed with puree Supervision: Staff to assist with self feeding;Full supervision/cueing for compensatory strategies Compensations: Minimize environmental distractions;Slow rate;Small sips/bites;Hard cough after swallow;Other (Comment) (cough then dry swallow) Postural Changes and/or Swallow Maneuvers: Seated upright 90 degrees                  Oral care BID   Frequent or constant Supervision/Assistance Dysphagia, oropharyngeal phase (R13.12)     Continue with current plan of care;MBS    Shauntee Karp I. Hardin Negus, [place], [place] Office number [phone]  Curtis Ramos  02/10/2023, 10:00 AM

## 2023-02-10 NOTE — Progress Notes (Signed)
Physical Therapy Treatment Patient Details Name: RAYMAND YENTER MRN: [id number] DOB: 1932/10/04 Today's Date: 02/10/2023   History of Present Illness Pt is 89 year old presented to [facility] on  01/13/23 after unwitnessed fall at work. In ED pt had PEA arrest with CPR x 9 minutes and intubated. Pt with respiratory failure and septic shock due to pulmonary edema, atelectasis post CPR rib/sternal fxs, PNA, and acute on chronic heart failure. Failed extubation 3/3 and reintubated until extubated 3/6. PEG tube placed 3/27. PMH - HTN, heart failure, afib, thyroid CA    PT Comments    Pt admitted with above diagnosis. Pt continues to progress slowly.  Cannot progress ambulation yet but did stand to RW and pivot today. Pt still with significant posterior lean which requires +2 assist.  Pt with poor postural control and weakness in core muscles.  Will continue PT.  Pt currently with functional limitations due to balance and endurance deficits. Pt will benefit from acute skilled PT to increase their independence and safety with mobility to allow discharge.      Recommendations for follow up therapy are one component of a multi-disciplinary discharge planning process, led by the attending physician.  Recommendations may be updated based on patient status, additional functional criteria and insurance authorization.  Follow Up Recommendations  Can patient physically be transported by private vehicle: No    Assistance Recommended at Discharge Frequent or constant Supervision/Assistance  Patient can return home with the following A lot of help with bathing/dressing/bathroom;Assistance with cooking/housework;Direct supervision/assist for medications management;Direct supervision/assist for financial management;Assist for transportation;Help with stairs or ramp for entrance;Two people to help with walking and/or transfers   Equipment Recommendations  Wheelchair (measurements PT);Wheelchair cushion (measurements PT)     Recommendations for Other Services       Precautions / Restrictions Precautions Precautions: Fall;Other (comment) Precaution Comments: G tube, primo fit Restrictions Weight Bearing Restrictions: No Other Position/Activity Restrictions: heavy posterior lean     Mobility  Bed Mobility Overal bed mobility: Needs Assistance Bed Mobility: Rolling, Sidelying to Sit Rolling: Mod assist Sidelying to sit: Mod assist, +2 for safety/equipment, +2 for physical assistance       General bed mobility comments: cues for sequencing    Transfers Overall transfer level: Needs assistance Equipment used: Rolling walker (2 wheels) Transfers: Sit to/from Stand, Bed to chair/wheelchair/BSC Sit to Stand: Mod assist, +2 physical assistance, From elevated surface Stand pivot transfers: Mod assist, +2 physical assistance, From elevated surface         General transfer comment: stood 2x from EOB using rw and +2 physical assist to steady. Posterior lean. mod cues for posture technique with rw. Pt tending toward forward flexion. Encouraged trunk extension.  Pt took pivotal steps to the recliner with alot of assist and cuing with pt leaning posteriorly needing assist for anterior translation.    Ambulation/Gait                   Stairs             Wheelchair Mobility    Modified Rankin (Stroke Patients Only)       Balance Overall balance assessment: Needs assistance Sitting-balance support: Feet supported, No upper extremity supported, Bilateral upper extremity supported Sitting balance-Leahy Scale: Fair Sitting balance - Comments: UE support and intermittent cues to correct posterior lean. pt sits with forward flexed posture and needs cues to work on scapular retraction and neck extension and worked on upright posture before standing. Pt with difficulty maintaining  upright posture without cues. Postural control: Posterior lean Standing balance support: Bilateral upper  extremity supported, During functional activity Standing balance-Leahy Scale: Poor Standing balance comment: rw and mod physical assist +2 for static standing. fatigues quickly                            Cognition Arousal/Alertness: Awake/alert Behavior During Therapy: WFL for tasks assessed/performed Overall Cognitive Status: Within Functional Limits for tasks assessed Area of Impairment: Orientation, Memory, Following commands, Safety/judgement, Awareness, Problem solving                 Orientation Level: Disoriented to, Time, Situation   Memory: Decreased short-term memory Following Commands: Follows one step commands with increased time Safety/Judgement: Decreased awareness of safety, Decreased awareness of deficits Awareness: Intellectual Problem Solving: Slow processing, Decreased initiation, Difficulty sequencing, Requires verbal cues, Requires tactile cues General Comments: slow processing        Exercises General Exercises - Lower Extremity Ankle Circles/Pumps: AROM, AAROM, 5 reps Quad Sets: AROM, 10 reps Long Arc Quad: AROM, Both, 10 reps, Seated Heel Slides: AAROM, 10 reps    General Comments General comments (skin integrity, edema, etc.): VSS      Pertinent Vitals/Pain Pain Assessment Pain Assessment: Faces Faces Pain Scale: Hurts even more Breathing: normal Negative Vocalization: none Facial Expression: smiling or inexpressive Body Language: relaxed Consolability: no need to console PAINAD Score: 0 Pain Location: abdomen Pain Descriptors / Indicators: Discomfort, Grimacing, Guarding Pain Intervention(s): Limited activity within patient's tolerance, Monitored during session, Repositioned    Home Living                          Prior Function            PT Goals (current goals can now be found in the care plan section) Progress towards PT goals: Progressing toward goals    Frequency    Min 2X/week      PT Plan  Frequency needs to be updated    Co-evaluation PT/OT/SLP Co-Evaluation/Treatment: Yes Reason for Co-Treatment: Complexity of the patient's impairments (multi-system involvement) PT goals addressed during session: Mobility/safety with mobility;Proper use of DME OT goals addressed during session: ADL's and self-care;Strengthening/ROM      AM-PAC PT "6 Clicks" Mobility   Outcome Measure  Help needed turning from your back to your side while in a flat bed without using bedrails?: A Lot Help needed moving from lying on your back to sitting on the side of a flat bed without using bedrails?: Total Help needed moving to and from a bed to a chair (including a wheelchair)?: Total Help needed standing up from a chair using your arms (e.g., wheelchair or bedside chair)?: Total Help needed to walk in hospital room?: Total Help needed climbing 3-5 steps with a railing? : Total 6 Click Score: 7    End of Session Equipment Utilized During Treatment: Gait belt Activity Tolerance: Patient tolerated treatment well Patient left: in chair;with call bell/phone within reach;with chair alarm set Nurse Communication: Mobility status;Need for lift equipment (stedy) PT Visit Diagnosis: Unsteadiness on feet (R26.81);Muscle weakness (generalized) (M62.81)     Time: 1040-1110 PT Time Calculation (min) (ACUTE ONLY): 30 min  Charges:  $Therapeutic Activity: 8-22 mins                     [facility] M,PT Acute Rehab Services [place] 02/10/2023, 1:58  PM   

## 2023-02-10 NOTE — Progress Notes (Signed)
Occupational Therapy Treatment Patient Details Name: Curtis Ramos MRN: [id number] DOB: Mar 07, 1932 Today's Date: 02/10/2023   History of present illness Pt is 89 year old presented to [facility] on  01/13/23 after unwitnessed fall at work. In ED pt had PEA arrest with CPR x 9 minutes and intubated. Pt with respiratory failure and septic shock due to pulmonary edema, atelectasis post CPR rib/sternal fxs, PNA, and acute on chronic heart failure. Failed extubation 3/3 and reintubated until extubated 3/6. PMH - HTN, heart failure, afib, thyroid CA   OT comments  Pt progressing towards acute OT goals. Focus of session was taking pivotal steps to access recliner. Pt utilized rw and mod physical assist +2, mod verbal cues for posture and technique with rw. Pt with tendency towards forward flexion, spent some time EOB encouraging trunk extension and more upright posture. Pt in recliner at end of session. Recommend staff utilize [employer] for tranfers. D/c recommendation remains appropriate.    Recommendations for follow up therapy are one component of a multi-disciplinary discharge planning process, led by the attending physician.  Recommendations may be updated based on patient status, additional functional criteria and insurance authorization.    Assistance Recommended at Discharge Frequent or constant Supervision/Assistance  Patient can return home with the following  A lot of help with walking and/or transfers;A lot of help with bathing/dressing/bathroom;Assistance with cooking/housework;Direct supervision/assist for medications management;Assistance with feeding;Direct supervision/assist for financial management;Assist for transportation;Help with stairs or ramp for entrance   Equipment Recommendations  Other (comment) (defer to next venue)    Recommendations for Other Services      Precautions / Restrictions Precautions Precautions: Fall;Other (comment) Precaution Comments: cortrak Restrictions Weight  Bearing Restrictions: No Other Position/Activity Restrictions: heavy posterior lean       Mobility Bed Mobility Overal bed mobility: Needs Assistance Bed Mobility: Rolling, Sidelying to Sit Rolling: Mod assist Sidelying to sit: Mod assist, +2 for safety/equipment, +2 for physical assistance       General bed mobility comments: cues for sequencing    Transfers Overall transfer level: Needs assistance Equipment used: Rolling walker (2 wheels) Transfers: Sit to/from Stand, Bed to chair/wheelchair/BSC Sit to Stand: Mod assist, +2 physical assistance, From elevated surface Stand pivot transfers: Mod assist, +2 physical assistance, From elevated surface         General transfer comment: stood 2x from EOB using rw and +2 physical assist to steady. Posterior lean. mod cues for posture technique with rw. Pt tending toward forward flexion. Encouraged trunk extension.     Balance Overall balance assessment: Needs assistance Sitting-balance support: Feet supported, No upper extremity supported Sitting balance-Leahy Scale: Fair Sitting balance - Comments: UE support and intermittent cues to correct posterior lean Postural control: Posterior lean Standing balance support: Bilateral upper extremity supported, During functional activity Standing balance-Leahy Scale: Poor Standing balance comment: rw and min physical assist +2 for static standing. fatigues quickly                           ADL either performed or assessed with clinical judgement   ADL Overall ADL's : Needs assistance/impaired                         Toilet Transfer: Moderate assistance;Maximal assistance;+2 for physical assistance;Stand-pivot;Rolling walker (2 wheels) Toilet Transfer Details (indicate cue type and reason): simulated with EOB to chair. pivotal steps, mod cues, mod-max +2 physical assist  General ADL Comments: Pt stood 2x from EOB with +2 physical assist and rw, mod  cueing. Pt with posterior lean, fearful of following. pt able to take small, pivotal steps to reach recliner.    Extremity/Trunk Assessment Upper Extremity Assessment Upper Extremity Assessment: Generalized weakness   Lower Extremity Assessment Lower Extremity Assessment: Defer to PT evaluation        Vision       Perception     Praxis      Cognition Arousal/Alertness: Awake/alert Behavior During Therapy: WFL for tasks assessed/performed Overall Cognitive Status: Within Functional Limits for tasks assessed                                 General Comments: slow processing        Exercises      Shoulder Instructions       General Comments      Pertinent Vitals/ Pain       Pain Assessment Pain Assessment: Faces Faces Pain Scale: Hurts even more Pain Location: abdomen Pain Descriptors / Indicators: Discomfort, Grimacing, Guarding Pain Intervention(s): Limited activity within patient's tolerance, Monitored during session, Repositioned  Home Living                                          Prior Functioning/Environment              Frequency  Min 2X/week        Progress Toward Goals  OT Goals(current goals can now be found in the care plan section)  Progress towards OT goals: Progressing toward goals  Acute Rehab OT Goals Patient Stated Goal: to get better OT Goal Formulation: With patient Time For Goal Achievement: 02/07/23 Potential to Achieve Goals: Good ADL Goals Pt Will Perform Eating: with supervision;sitting Pt Will Perform Grooming: sitting;with min assist Pt Will Perform Upper Body Dressing: sitting;with min assist Pt Will Perform Lower Body Dressing: sit to/from stand;with mod assist Pt Will Transfer to Toilet: stand pivot transfer;bedside commode;with mod assist  Plan Discharge plan remains appropriate    Co-evaluation    PT/OT/SLP Co-Evaluation/Treatment: Yes Reason for Co-Treatment: Complexity of  the patient's impairments (multi-system involvement)   OT goals addressed during session: ADL's and self-care;Strengthening/ROM      AM-PAC OT "6 Clicks" Daily Activity     Outcome Measure   Help from another person eating meals?: A Little Help from another person taking care of personal grooming?: A Little Help from another person toileting, which includes using toliet, bedpan, or urinal?: A Lot Help from another person bathing (including washing, rinsing, drying)?: A Lot Help from another person to put on and taking off regular upper body clothing?: A Little Help from another person to put on and taking off regular lower body clothing?: A Lot 6 Click Score: 15    End of Session Equipment Utilized During Treatment: Gait belt;Rolling walker (2 wheels)  OT Visit Diagnosis: Unsteadiness on feet (R26.81);Other abnormalities of gait and mobility (R26.89);Muscle weakness (generalized) (M62.81);History of falling (Z91.81)   Activity Tolerance Patient tolerated treatment well;Patient limited by fatigue;Patient limited by pain   Patient Left in chair;with call bell/phone within reach;with chair alarm set   Nurse Communication Mobility status        Time: 1040-1110 OT Time Calculation (min): 30 min  Charges: OT General Charges $OT Visit:  1 Visit OT Treatments $Self Care/Home Management : 8-22 mins  Tyrone Schimke, OT Acute Rehabilitation Services Office: [phone]   Hortencia Pilar 02/10/2023, 11:28 AM

## 2023-02-10 NOTE — Progress Notes (Signed)
Nutrition Quick Note:   MD consult to initiate EN feeds. Pt had PEG placed on 02/09/23. RD will initiate bolus feeds. Will continue to monitor tolerance throughout LOS.   - Initiate 6 cans of Osmolite 1.5 per day.  - Flush with 50 mL free water before and after each bolus.  - This will provide 2133 kcals, 89 gm protein and 1083 mL free water.   Please see RD's note from 3/26 for full assessment.   Thalia Bloodgood, RD, LDN, CNSC.

## 2023-02-10 NOTE — Progress Notes (Signed)
Modified Barium Swallow Study  Patient Details  Name: Curtis Ramos MRN: [id number] Date of Birth: 12-03-1931  Today's Date: 02/10/2023  Modified Barium Swallow completed.  Full report located under Chart Review in the Imaging Section.  History of Present Illness 89 year old man who presented to [facility] ED 2/29 via EMS after unwitnessed fall at work, patient did not have any complaints prior to fall. He was found down between a radiator and the wall, unresponsive with contusion and deformities to R side of his head. Initially with tachycardic with EMS, then bradycardic. ST elevation noted on EKG and Code STEMI was called, later called off as ST elevation had resolved. Patient lost pulses while in ED and became apneic with cardiac monitoring showing PEA arrest. CPR administered x 9 minutes with Epi x 4 and ROSC, patient never had a shockable rhythm. Intubated peri-arrest 2/28-3/3 then reintubated until 3/6. G-tube placed 3/27. PMHx significant for HTN, HFrEF with NICM (cMRI 12/2022 with severe LV dilation, EF 17%, LBBB, followed by Dr. Haroldine Laws), AFib (on Eliquis), MAI infection, GERD, hypothyroidism, history of thyroid CA.   Clinical Impression Please note that there is no video recording of this modified barium swallow study due to an equipment malfunction. Pt presents with oropharyngeal dysphagia characterized by a pharyngeal delay and reduction in bolus cohesion, laryngeal closure, and anterior hyoid excursion, and pharyngeal stripping. He demonstrated oral residue, vallecular residue, and pyriform sinus residue which were improved with a liquid wash or with secondary swallows. Penetration (PAS 3, 5) was noted with thin liquids and this ultimately resulted in sensed aspiration (PAS 7) which triggered throat clearing and/or coughing. Coughing mobilized aspirated/penetrated material to the laryngeal surface of the epiglottis, but did not expell it from the larynx. No functional benefit was noted with a  chin tuck posture. Overall, his swallow function has improved as evidenced by improved airway protection with nectar thick liquids. A dysphagia 2 diet with nectar thick liquids is recommended at this time and continued SLP services are clinically indicated for dysphagia. Factors that may increase risk of adverse event in presence of aspiration ([place] 2021): Poor general health and/or compromised immunity;Frail or deconditioned;Weak cough;Reduced cognitive function  Swallow Evaluation Recommendations Recommendations: PO diet PO Diet Recommendation: Dysphagia 2 (Finely chopped);Mildly thick liquids (Level 2, nectar thick) Liquid Administration via: Cup;Straw Medication Administration: Crushed with puree Supervision: Staff to assist with self-feeding Swallowing strategies  : Minimize environmental distractions;Slow rate;Small bites/sips;Multiple dry swallows after each bite/sip;Follow solids with liquids Postural changes: Stay upright 30-60 min after meals;Position pt fully upright for meals Oral care recommendations: Oral care BID (2x/day) Recommended consults: Consider esophageal assessment Caregiver Recommendations: Avoid jello, ice cream, thin soups, popsicles    Marinell Igarashi I. Hardin Negus, [place], [place] Office number [phone]  [name] 02/10/2023,4:09 PM

## 2023-02-11 LAB — PHOSPHORUS: Phosphorus: 2.5 mg/dL (ref 2.5–4.6)

## 2023-02-11 LAB — TSH: TSH: 23.002 u[IU]/mL — ABNORMAL HIGH (ref 0.350–4.500)

## 2023-02-11 LAB — GLUCOSE, CAPILLARY
Glucose-Capillary: 108 mg/dL — ABNORMAL HIGH (ref 70–99)
Glucose-Capillary: 123 mg/dL — ABNORMAL HIGH (ref 70–99)
Glucose-Capillary: 129 mg/dL — ABNORMAL HIGH (ref 70–99)
Glucose-Capillary: 81 mg/dL (ref 70–99)

## 2023-02-11 LAB — MAGNESIUM: Magnesium: 2.1 mg/dL (ref 1.7–2.4)

## 2023-02-11 LAB — T4, FREE: Free T4: 1.21 ng/dL — ABNORMAL HIGH (ref 0.61–1.12)

## 2023-02-11 MED ORDER — SPIRONOLACTONE 25 MG PO TABS: 12.5000 mg | ORAL_TABLET | Freq: Every day | ORAL | Status: DC

## 2023-02-11 MED ORDER — OSMOLITE 1.5 CAL PO LIQD: 237.0000 mL | Freq: Every day | ORAL | 0 refills | Status: DC

## 2023-02-11 MED ORDER — FREE WATER: 100.0000 mL | Freq: Every day | Status: AC

## 2023-02-11 MED ORDER — LIDOCAINE 5 % EX PTCH: 1.0000 | MEDICATED_PATCH | CUTANEOUS | 0 refills | Status: DC

## 2023-02-11 MED ORDER — ENSURE ENLIVE PO LIQD: 237.0000 mL | Freq: Two times a day (BID) | ORAL | 12 refills | Status: AC

## 2023-02-11 MED ORDER — MIRTAZAPINE 7.5 MG PO TABS: 7.5000 mg | ORAL_TABLET | Freq: Every day | ORAL | Status: DC

## 2023-02-11 MED ORDER — PANTOPRAZOLE SODIUM 40 MG PO TBEC: 40.0000 mg | DELAYED_RELEASE_TABLET | Freq: Two times a day (BID) | ORAL | 1 refills | Status: DC

## 2023-02-11 MED ORDER — ACETAMINOPHEN 500 MG PO TABS: 1000.0000 mg | ORAL_TABLET | Freq: Four times a day (QID) | ORAL | 0 refills | Status: AC | PRN

## 2023-02-11 MED ORDER — LOSARTAN POTASSIUM 25 MG PO TABS: 12.5000 mg | ORAL_TABLET | Freq: Every day | ORAL | Status: DC

## 2023-02-11 NOTE — Progress Notes (Signed)
Physical Therapy Treatment Patient Details Name: Curtis Ramos MRN: [id number] DOB: 11-27-1931 Today's Date: 02/11/2023   History of Present Illness Pt is 89 year old presented to [facility] on  01/13/23 after unwitnessed fall at work. In ED pt had PEA arrest with CPR x 9 minutes and intubated. Pt with respiratory failure and septic shock due to pulmonary edema, atelectasis post CPR rib/sternal fxs, PNA, and acute on chronic heart failure. Failed extubation 3/3 and reintubated until extubated 3/6. PEG tube placed 3/27. PMH - HTN, heart failure, afib, thyroid CA    PT Comments    Pt was seen for mobility and demonstrates a better effort today, able to stand up on walker and allow PT to pivot transfer to a chair.  His overall ability is better, did have a hearing aid in and may have been a bigger help than estimated to have pt understand what PT is having him do for the session.  Pt was placed in a recliner with alarm system activated and nursing aware of how to assist back to bed.  Follow along for therapy goals until dc to rehab setting.     Recommendations for follow up therapy are one component of a multi-disciplinary discharge planning process, led by the attending physician.  Recommendations may be updated based on patient status, additional functional criteria and insurance authorization.  Follow Up Recommendations  Can patient physically be transported by private vehicle: No    Assistance Recommended at Discharge Frequent or constant Supervision/Assistance  Patient can return home with the following A lot of help with walking and/or transfers;A lot of help with bathing/dressing/bathroom;Assistance with cooking/housework;Direct supervision/assist for financial management;Direct supervision/assist for medications management;Assist for transportation;Help with stairs or ramp for entrance   Equipment Recommendations  Wheelchair (measurements PT);Wheelchair cushion (measurements PT)     Recommendations for Other Services       Precautions / Restrictions Precautions Precautions: Fall Precaution Comments: G tube, primo fit Restrictions Weight Bearing Restrictions: No Other Position/Activity Restrictions: tends to posteriorly lean but better today     Mobility  Bed Mobility Overal bed mobility: Needs Assistance Bed Mobility: Rolling, Sidelying to Sit Rolling: Mod assist Sidelying to sit: Mod assist            Transfers Overall transfer level: Needs assistance Equipment used: Rolling walker (2 wheels) Transfers: Sit to/from Stand, Bed to chair/wheelchair/BSC Sit to Stand: Min assist, Mod assist   Step pivot transfers: Min assist, Mod assist       General transfer comment: Pt was surprisingly able to assist once he had confidence to try to stand    Ambulation/Gait               General Gait Details: deferred   Stairs             Wheelchair Mobility    Modified Rankin (Stroke Patients Only)       Balance Overall balance assessment: Needs assistance Sitting-balance support: Feet supported Sitting balance-Leahy Scale: Fair     Standing balance support: Bilateral upper extremity supported, During functional activity Standing balance-Leahy Scale: Poor Standing balance comment: RW with less assist today, able to keep him on his feet for a minute                            Cognition Arousal/Alertness: Awake/alert Behavior During Therapy: WFL for tasks assessed/performed Overall Cognitive Status: No family/caregiver present to determine baseline cognitive functioning Area of Impairment: Problem solving,  Awareness, Safety/judgement, Following commands, Attention                 Orientation Level: Situation, Time Current Attention Level: Selective Memory: Decreased recall of precautions, Decreased short-term memory Following Commands: Follows one step commands with increased time Safety/Judgement: Decreased  awareness of deficits, Decreased awareness of safety Awareness: Intellectual Problem Solving: Slow processing, Requires verbal cues, Requires tactile cues General Comments: pt was able to assist standing but is convinced he cannot do it        Exercises      General Comments General comments (skin integrity, edema, etc.): Pt is up to bedside with less assist to get there, more open to trying to move.  Pt is tolerant of standing and assisted directly to get to chair, explained to nursing how to help back to bed      Pertinent Vitals/Pain Pain Assessment Pain Assessment: Faces Faces Pain Scale: No hurt    Home Living Family/patient expects to be discharged to:: Skilled nursing facility Living Arrangements: Other (Comment)                      Prior Function            PT Goals (current goals can now be found in the care plan section) Acute Rehab PT Goals Patient Stated Goal: get home Progress towards PT goals: Progressing toward goals    Frequency    Min 2X/week      PT Plan Current plan remains appropriate    Co-evaluation              AM-PAC PT "6 Clicks" Mobility   Outcome Measure  Help needed turning from your back to your side while in a flat bed without using bedrails?: A Lot Help needed moving from lying on your back to sitting on the side of a flat bed without using bedrails?: A Lot Help needed moving to and from a bed to a chair (including a wheelchair)?: A Lot Help needed standing up from a chair using your arms (e.g., wheelchair or bedside chair)?: A Lot Help needed to walk in hospital room?: Total Help needed climbing 3-5 steps with a railing? : Total 6 Click Score: 10    End of Session Equipment Utilized During Treatment: Gait belt Activity Tolerance: Patient tolerated treatment well Patient left: in chair;with call bell/phone within reach;with chair alarm set Nurse Communication: Mobility status;Need for lift equipment PT Visit  Diagnosis: Unsteadiness on feet (R26.81);Muscle weakness (generalized) (M62.81)     Time: [id number] PT Time Calculation (min) (ACUTE ONLY): 31 min  Charges:  $Therapeutic Activity: 23-37 mins          Ramond Dial 02/11/2023, 4:08 PM  Mee Hives, PT PhD Acute Rehab Dept. Number: [place]

## 2023-02-11 NOTE — Progress Notes (Signed)
PROGRESS NOTE    Curtis Ramos  [id number] DOB: [birth year] DOA: 01/13/2023 PCP: Leonides Sake, MD    Brief Narrative:  89 year old M with PMH of systolic CHF, NICM, A-fib on Eliquis, HTN, hypothyroidism, thyroid cancer and GERD presented to ED on 2/29 after unwitnessed fall at home.  As per the PCCM notes patient was found down between the radiator and the wall unresponsive with contusion.  On arrival to ED patient became apneic and lost pulses and cardiac monitoring showed PEA arrest.  CPR administered for 9 minutes with epi x 4 and ROSC achieved.  Patient was intubated and admitted to ICU. cardiology was consulted. CT of the chest abdomen and pelvis showed known displaced transverse fractures of the mid lower sternal body, acute fracture of the coastal cartilage of bilateral second through sixth ribs, acute fracture of the anterior second to 7 ribs. He was started on Unasyn for aspiration pneumonitis, later transitioned to cefepime for Pseudomonas VAP.     Patient was extubated on 01/19/2023, and transferred to [facility] on 01/21/2023. On 3/9, he became tachypneic and requiried BIPAP for 24 hours, and transitioned to HF oxygen.  Patient was weaned off oxygen to room air on 3/12.  Remains on TF via cortrack due to significant risk for aspiration.  Hospital course significant for delirium that seems to have resolved.   After discussion with family, IR was consulted for G-tube.  However, SLP reevaluated patient and advanced diet to dysphagia-1. Family wanted to hold on G-tube.   SLP advancing him further to dysphagia 2 diet.  Unfortunately, patient's p.o. intake is very poor.  After discussion with RD and patient's son, IR reconsulted for G-tube. 3/26, patient declined PEG tube 3/27, patient agreed so received a PEG tube.  Uneventful.  Medically stable and waiting to go to SNF   Assessment & Plan:   PEA cardiac arrest: 90 minutes CPR, 4 rounds of epi.  Non shockable rhythm.  Rib fractures.   Currently off vasopressors.  He is on amiodarone to suppress PVCs.  Severe protein calorie malnutrition, oropharyngeal dysphagia: He has some improvement of intake today, ST following. Now on dysphagia 2 diet with nectar thick liquids.  He can probably go on regular diet.  He underwent Peg tube placement, start tube feeding.   Acute urinary retention: Resolved Abnormal LFTs: Resolved Delirium: Resolved. Ventilator associated pneumonia: Improved.  Off antibiotics. Acute respiratory failure with hypoxemia: Resolved.  On room air now. Septic shock: Resolved. Hypothyroidism: On Synthroid 175 mcg. Chronic systolic heart failure: Stable now.  On losartan, amiodarone, spironolactone.  Euvolemic. Acute kidney injury: Resolved.  Normalized.  Discharge to a skilled nursing facility when available.    DVT prophylaxis: enoxaparin (LOVENOX) injection 40 mg Start: 02/08/23 2200 SCDs Start: 01/13/23 1327   Code Status: Full code Family Communication: None today. Disposition Plan: Status is: Inpatient Remains inpatient appropriate because: medically stable, waiting for SNF bed. Can be discharged      Consultants:  GI IR Critical care Cardiology  Procedures:  Multiple as above  Antimicrobials:  Completed antibiotic therapy   Subjective:  Patient seen and examined.  No overnight events.  Tolerating tube feedings.  He is also trying to eat whenever he wants to.  Nursing reported no overnight concerns.  Patient had just fallen asleep so I did not wake him up.  No family at the bedside.  Objective: Vitals:   02/10/23 1513 02/10/23 2003 02/11/23 0448 02/11/23 0748  BP: (!) 98/49 (!) 96/57 120/77 112/65  Pulse:  76 75 78 72  Resp:  17 17 16   Temp: 97.8 F (36.6 C) 98.3 F (36.8 C) 97.6 F (36.4 C) 98.9 F (37.2 C)  TempSrc: Oral  Oral   SpO2: 97% 95% 92% 94%  Weight:      Height:        Intake/Output Summary (Last 24 hours) at 02/11/2023 1111 Last data filed at 02/10/2023  2000 Gross per 24 hour  Intake 1722 ml  Output 750 ml  Net 972 ml    Filed Weights   02/06/23 0500 02/09/23 0500 02/10/23 0500  Weight: 58.6 kg 58.6 kg 58.2 kg    Examination:  General exam: Appears calm and comfortable .  On room air. Respiratory system: No added Cardiovascular system: S1 & S2 heard, RRR. No pedal edema. Gastrointestinal system: Soft.  Nontender.  Bowel sound present.  PEG tube in place.  Dry and intact. Central nervous system: Alert and oriented. No focal neurological deficits.  Can move all extremities equally. Extremities: Symmetric 5 x 5 power.    Data Reviewed: I have personally reviewed following labs and imaging studies  CBC: Recent Labs  Lab 02/04/23 1307 02/05/23 0117 02/07/23 0455  WBC 13.1* 13.3* 11.2*  NEUTROABS  --   --  8.8*  HGB 11.3* 11.0* 10.1*  HCT 34.1* 32.0* 31.2*  MCV 111.8* 113.9* 112.6*  PLT 374 347 [id number]    Basic Metabolic Panel: Recent Labs  Lab 02/05/23 0117 02/07/23 0455 02/10/23 0753 02/10/23 1758 02/11/23 0525  NA 137 132*  --   --   --   K 4.3 4.3  --   --   --   CL 108 103  --   --   --   CO2 23 23  --   --   --   GLUCOSE 107* 119*  --   --   --   BUN 37* 30*  --   --   --   CREATININE 1.09 0.94 1.20  --   --   CALCIUM 10.0 10.2  --   --   --   MG 1.9  --  2.1 2.2 2.1  PHOS 2.4*  --  3.2 2.8 2.5    GFR: Estimated Creatinine Clearance: 35 mL/min (by C-G formula based on SCr of 1.2 mg/dL). Liver Function Tests: Recent Labs  Lab 02/05/23 0117 02/07/23 0455  AST  --  24  ALT  --  39  ALKPHOS  --  153*  BILITOT  --  0.7  PROT  --  7.0  ALBUMIN 2.6* 2.4*    No results for input(s): "LIPASE", "AMYLASE" in the last 168 hours. No results for input(s): "AMMONIA" in the last 168 hours. Coagulation Profile: Recent Labs  Lab 02/07/23 0455  INR 1.3*    Cardiac Enzymes: No results for input(s): "CKTOTAL", "CKMB", "CKMBINDEX", "TROPONINI" in the last 168 hours. BNP (last 3 results) Recent Labs     06/18/22 1331 10/05/22 0848  PROBNP 1,795* 3,434*    HbA1C: No results for input(s): "HGBA1C" in the last 72 hours. CBG: Recent Labs  Lab 02/10/23 1659 02/10/23 2021 02/11/23 0007 02/11/23 0458 02/11/23 0748  GLUCAP 118* 124* 129* 81 123*    Lipid Profile: No results for input(s): "CHOL", "HDL", "LDLCALC", "TRIG", "CHOLHDL", "LDLDIRECT" in the last 72 hours. Thyroid Function Tests: Recent Labs    02/11/23 0525  TSH 23.002*  FREET4 1.21*   Anemia Panel: No results for input(s): "VITAMINB12", "FOLATE", "FERRITIN", "TIBC", "IRON", "RETICCTPCT" in the last  72 hours. Sepsis Labs: No results for input(s): "PROCALCITON", "LATICACIDVEN" in the last 168 hours.  No results found for this or any previous visit (from the past 240 hour(s)).       Radiology Studies: IR GASTROSTOMY TUBE MOD SED  Result Date: 02/09/2023 INDICATION: 89 year-old with oropharyngeal dysphagia. EXAM: PERCUTANEOUS GASTROSTOMY TUBE WITH FLUOROSCOPIC GUIDANCE Physician: Stephan Minister. Anselm Pancoast, MD MEDICATIONS: Ancef 2 g; Antibiotics were administered within 1 hour of the procedure. Glucagon 1 mg ANESTHESIA/SEDATION: Moderate (conscious) sedation was employed during this procedure. A total of Versed 2mg  and fentanyl 100 mcg was administered intravenously at the order of the provider performing the procedure. Total intra-service moderate sedation time: 42 minutes. Patient's level of consciousness and vital signs were monitored continuously by radiology nurse throughout the procedure under the supervision of the provider performing the procedure. FLUOROSCOPY: Radiation Exposure Index (as provided by the fluoroscopic device): 40 mGy Kerma COMPLICATIONS: None immediate. PROCEDURE: The procedure was explained to the patient. The risks and benefits of the procedure were discussed and the patient's questions were addressed. Informed consent was obtained from the patient. The patient was placed on the interventional table. An  orogastric tube was placed with fluoroscopic guidance. Stomach was insufflated through the orogastric tube. Small percutaneous window was identified along the abdominal midline. The anterior abdomen was prepped and draped in sterile fashion. Maximal barrier sterile technique was utilized including caps, mask, sterile gowns, sterile gloves, sterile drape, hand hygiene and skin antiseptic. The patient was biting on the orogastric tube and the orogastric tube had to be removed. Attempted to place another orogastric tube but the tube was preferentially going down the airway and another orogastric tube could not be placed. Therefore, the existing nasogastric tube was pulled back into the stomach body. The stomach was insufflated with air. The skin and subcutaneous tissues were anesthetized with 1% lidocaine. A needle was directed into the distended stomach with fluoroscopic guidance. A wire was advanced into the stomach and a T fastener was deployed. An incision was made adjacent to the T fastener. The needle was directed back into the stomach using fluoroscopic guidance and contrast was injected to confirm placement in the stomach. A stiff Amplatz wire was advanced into the stomach. Tract was dilated to accommodate an 18 French peel-away sheath. 26 French Entuit gastrostomy tube was easily advanced over the wire and the peel-away sheath was removed. Balloon was inflated with 7 mL of saline. Contrast injection confirmed placement in the stomach. T fastener is still intact. Gastrostomy tube was flushed with saline and a dressing was placed. IMPRESSION: Successful fluoroscopic guided percutaneous gastrostomy tube placement. Patient has a 66 French balloon retention gastrostomy tube. Electronically Signed   By: Markus Daft M.D.   On: 02/09/2023 14:36        Scheduled Meds:  amiodarone  200 mg Oral Daily   enoxaparin (LOVENOX) injection  40 mg Subcutaneous Q24H   feeding supplement  237 mL Oral BID BM   feeding  supplement (OSMOLITE 1.5 CAL)  237 mL Per Tube 6 X Daily   free water  100 mL Per Tube 6 X Daily   levothyroxine  175 mcg Oral Q0600   lidocaine  1 patch Transdermal Q24H   losartan  12.5 mg Oral Daily   mirtazapine  7.5 mg Oral QHS   mouth rinse  15 mL Mouth Rinse 4 times per day   pantoprazole (PROTONIX) IV  40 mg Intravenous Q12H   spironolactone  12.5 mg Oral Daily  Continuous Infusions:  sodium chloride Stopped (01/25/23 1632)     LOS: 29 days    Time spent: 40 minutes    Barb Merino, MD [facility] Pager [phone]

## 2023-02-11 NOTE — TOC Transition Note (Signed)
Transition of Care [facility]) - CM/SW Discharge Note   Patient Details  Name: Curtis Ramos MRN: [id number] Date of Birth: July 31, 1932  Transition of Care [facility]) CM/SW Contact:  Jinger Neighbors, LCSW Phone Number: 02/11/2023, 1:28 PM   Clinical Narrative:     Call to report provided to RN. Pt's son [name] has been called to make him aware, as well as [facility] for transportation.  Discharge complete.    Final next level of care: Skilled Nursing Facility Barriers to Discharge: No Barriers Identified   Patient Goals and CMS Choice CMS Medicare.gov Compare Post Acute Care list provided to:: Patient Represenative (must comment) Choice offered to / list presented to : Adult Children  Discharge Placement                Patient chooses bed at: [facility] Patient to be transferred to facility by: [place] Name of family member notified: [name] Patient and family notified of of transfer: 02/11/23  Discharge Plan and Services Additional resources added to the After Visit Summary for   In-house Referral: Clinical Social Work Discharge Planning Services: CM Consult Post Acute Care Choice: Long Term Acute Care (LTAC)                               Social Determinants of Health (SDOH) Interventions SDOH Screenings   Food Insecurity: No Food Insecurity (01/18/2023)  Housing: Low Risk  (01/18/2023)  Transportation Needs: No Transportation Needs (01/18/2023)  Utilities: Not At Risk (01/18/2023)  Financial Resource Strain: Low Risk  (01/18/2023)  Tobacco Use: Low Risk  (02/09/2023)     Readmission Risk Interventions     No data to display

## 2023-02-11 NOTE — Progress Notes (Signed)
Mobility Specialist - Progress Note   02/11/23 1447  Mobility  Activity Repositioned in chair  Level of Assistance Moderate assist, patient does 50-74%  Assistive Device None  Activity Response Tolerated well  $Mobility charge 1 Mobility   Pt was received chair requesting assistance to be repositioned in chair. No complaints throughout. Pt left in chair with all needs met.   Franki Monte  Mobility Specialist Please contact via Solicitor or Rehab office at [phone]

## 2023-02-11 NOTE — Plan of Care (Signed)
Patient AOX3, disoriented to time.  VSS throughout shift.  All meds given on time as ordered.  Diminished lungs, IS encouraged.  PEG tube in place, bolus Tfs given.  Pt able to swallow meds whole.  Purewick in place.  POC maintained, will continue to monitor.  Problem: Education: Goal: Ability to describe self-care measures that may prevent or decrease complications (Diabetes Survival Skills Education) will improve Outcome: Progressing Goal: Individualized Educational Video(s) Outcome: Progressing   Problem: Coping: Goal: Ability to adjust to condition or change in health will improve Outcome: Progressing   Problem: Fluid Volume: Goal: Ability to maintain a balanced intake and output will improve Outcome: Progressing   Problem: Health Behavior/Discharge Planning: Goal: Ability to identify and utilize available resources and services will improve Outcome: Progressing Goal: Ability to manage health-related needs will improve Outcome: Progressing   Problem: Metabolic: Goal: Ability to maintain appropriate glucose levels will improve Outcome: Progressing   Problem: Nutritional: Goal: Maintenance of adequate nutrition will improve Outcome: Progressing Goal: Progress toward achieving an optimal weight will improve Outcome: Progressing   Problem: Skin Integrity: Goal: Risk for impaired skin integrity will decrease Outcome: Progressing   Problem: Tissue Perfusion: Goal: Adequacy of tissue perfusion will improve Outcome: Progressing   Problem: Education: Goal: Knowledge of General Education information will improve Description: Including pain rating scale, medication(s)/side effects and non-pharmacologic comfort measures Outcome: Progressing   Problem: Health Behavior/Discharge Planning: Goal: Ability to manage health-related needs will improve Outcome: Progressing   Problem: Clinical Measurements: Goal: Ability to maintain clinical measurements within normal limits will  improve Outcome: Progressing Goal: Will remain free from infection Outcome: Progressing Goal: Diagnostic test results will improve Outcome: Progressing Goal: Respiratory complications will improve Outcome: Progressing Goal: Cardiovascular complication will be avoided Outcome: Progressing   Problem: Activity: Goal: Risk for activity intolerance will decrease Outcome: Progressing   Problem: Nutrition: Goal: Adequate nutrition will be maintained Outcome: Progressing   Problem: Coping: Goal: Level of anxiety will decrease Outcome: Progressing   Problem: Elimination: Goal: Will not experience complications related to bowel motility Outcome: Progressing Goal: Will not experience complications related to urinary retention Outcome: Progressing   Problem: Pain Managment: Goal: General experience of comfort will improve Outcome: Progressing   Problem: Safety: Goal: Ability to remain free from injury will improve Outcome: Progressing   Problem: Skin Integrity: Goal: Risk for impaired skin integrity will decrease Outcome: Progressing

## 2023-02-11 NOTE — Discharge Summary (Signed)
Physician Discharge Summary  Curtis Ramos [id number] DOB: [birth year] DOA: 01/13/2023  PCP: Leonides Sake, MD  Admit date: 01/13/2023 Discharge date: 02/11/2023  Admitted From: Home Disposition: Skilled nursing facility  Recommendations for Outpatient Follow-up:  Follow up with PCP in 1-2 weeks after discharge Please obtain BMP/CBC/magnesium/phosphorus in one week   Discharge Condition: Fair CODE STATUS: Full code Diet recommendation: Dysphagia 2 diet, nectar thick liquid.  Tube feeding.  Discharge summary: 89 year old M with PMH of systolic CHF, NICM, A-fib on Eliquis, HTN, hypothyroidism, thyroid cancer and GERD presented to ED on 2/29 after unwitnessed fall at home. patient was found down between the radiator and the wall unresponsive with contusion.  On arrival to ED patient became apneic and lost pulses and cardiac monitoring showed PEA arrest.  CPR administered for 9 minutes with epi x 4 and ROSC achieved.  Patient was intubated and admitted to ICU. cardiology was consulted. CT of the chest abdomen and pelvis showed known displaced transverse fractures of the mid lower sternal body, acute fracture of the coastal cartilage of bilateral second through sixth ribs, acute fracture of the anterior second to 7 ribs. He was started on Unasyn for aspiration pneumonitis, later transitioned to cefepime for Pseudomonas VAP.     Patient was extubated on 01/19/2023, and transferred to [facility] on 01/21/2023. On 3/9, he became tachypneic and requiried BIPAP for 24 hours, and transitioned to HF oxygen.  Patient was weaned off oxygen to room air on 3/12.  Hospital course significant for delirium that seems to have resolved.   SLP advancing him further to dysphagia 2 diet.  Unfortunately, patient's p.o. intake is very poor.  After discussion with RD and patient's son, IR reconsulted for G-tube. 3/26, patient declined PEG tube 3/27, patient agreed so received a PEG tube.  Uneventful.  Medically stable and  waiting to go to SNF     Assessment & plan of care:   PEA cardiac arrest: 90 minutes CPR, 4 rounds of epi.  Non shockable rhythm.  Rib fractures.  Currently off vasopressors.  He is on amiodarone to suppress PVCs. Patient was seen by cardiology, heart failure team in the hospital. Echo 5/20 EF 20-25% Echo 12/21 EF 55-60% Echo  11/23   EF 20-25% LV markedly dilated + dyssynchrony - RV ok  Moderate MR/TR severe biatrial enlargement  Cath 2012: No CAD (cath not repeated as low suspicion for iCM and CKD 3b) cMRI 2/24: LBBB, LV EF 17%. RV EF 33%. Severe LAEt. No LGE.  Zio 2/24  AFL 100% avg rat 67 PVC 1.8% Initially on pressors. Resolved.  Advanced heart failure therapy now with losartan and spironolactone.  Jardiance on hold.  Entresto discontinued.  Outpatient cardiology follow-up.  Renal functions are stable.    Severe protein calorie malnutrition, oropharyngeal dysphagia: He has some improvement of intake but intake was not adequate.  Now on dysphagia 2 diet with nectar thick liquids.  Continue with aspiration precautions. He underwent Peg tube placement, tolerated tube feeding.  Instructions attached.   Acute urinary retention: Resolved  Abnormal LFTs: Resolved  Delirium: Resolved.  Ventilator associated pneumonia: Improved.  Off antibiotics.  Acute respiratory failure with hypoxemia: Resolved.  On room air now.  Septic shock: Resolved.  Hypothyroidism: On Synthroid 175 mcg.  Chronic systolic heart failure: Stable now.  On losartan, amiodarone, spironolactone.  Euvolemic.  Acute kidney injury: Resolved.  Normalized.  Electrolytes are adequate.   Patient is medically stable.  Remains frail.  Malnourished.  Patient and family decided  to pursue short-term rehab, transfer to SNF.  Discharge Diagnoses:  Principal Problem:   Cardiac arrest [facility]) Active Problems:   Oropharyngeal dysphagia   AKI (acute kidney injury) ([place])   Chronic systolic CHF (congestive heart failure)  (HCC)   Hypothyroidism   Septic shock (HCC)   Acute respiratory failure with hypoxia (HCC)   Protein-calorie malnutrition, severe   Hematochezia   VAP (ventilator-associated pneumonia) (HCC)   Delirium   Elevated liver enzymes   Acute urinary retention    Discharge Instructions  Discharge Instructions     Diet - low sodium heart healthy   Complete by: As directed    Dysphagia 2 diet with nectar thick liquids   Increase activity slowly   Complete by: As directed    No dressing needed   Complete by: As directed       Allergies as of 02/11/2023   No Known Allergies      Medication List     STOP taking these medications    carvedilol 6.25 MG tablet Commonly known as: COREG   dapagliflozin propanediol 10 MG Tabs tablet Commonly known as: Farxiga   Eliquis 5 MG Tabs tablet Generic drug: apixaban   Entresto 49-51 MG Generic drug: sacubitril-valsartan   furosemide 20 MG tablet Commonly known as: LASIX       TAKE these medications    acetaminophen 500 MG tablet Commonly known as: TYLENOL Take 2 tablets (1,000 mg total) by mouth every 6 (six) hours as needed for mild pain, headache or fever.   amiodarone 200 MG tablet Commonly known as: PACERONE Take 200 mg by mouth daily.   DOCUSATE SODIUM PO Take 1 tablet by mouth daily.   feeding supplement (OSMOLITE 1.5 CAL) Liqd Place 237 mLs into feeding tube 6 (six) times daily.   feeding supplement Liqd Take 237 mLs by mouth 2 (two) times daily between meals. Start taking on: February 12, 2023   free water Soln Place 100 mLs into feeding tube 6 (six) times daily.   lidocaine 5 % Commonly known as: LIDODERM Place 1 patch onto the skin daily. Remove & Discard patch within 12 hours or as directed by MD Start taking on: February 12, 2023   losartan 25 MG tablet Commonly known as: COZAAR Take 0.5 tablets (12.5 mg total) by mouth daily. Start taking on: February 12, 2023   mirtazapine 7.5 MG tablet Commonly known  as: REMERON Take 1 tablet (7.5 mg total) by mouth at bedtime.   pantoprazole 40 MG tablet Commonly known as: Protonix Take 1 tablet (40 mg total) by mouth 2 (two) times daily.   polyethylene glycol 17 g packet Commonly known as: MIRALAX / GLYCOLAX Take 17 g by mouth daily.   PRESERVISION AREDS PO Take 1 tablet by mouth daily.   spironolactone 25 MG tablet Commonly known as: ALDACTONE Take 0.5 tablets (12.5 mg total) by mouth daily. Start taking on: February 12, 2023   Synthroid 175 MCG tablet Generic drug: levothyroxine Take 175 mcg by mouth daily.               Discharge Care Instructions  (From admission, onward)           Start     Ordered   02/11/23 0000  No dressing needed        02/11/23 1239            No Known Allergies  Consultations: Heart failure team Palliative care Critical care   Procedures/Studies: IR GASTROSTOMY TUBE MOD  SED  Result Date: 02/09/2023 INDICATION: 89 year-old with oropharyngeal dysphagia. EXAM: PERCUTANEOUS GASTROSTOMY TUBE WITH FLUOROSCOPIC GUIDANCE Physician: Stephan Minister. Anselm Pancoast, MD MEDICATIONS: Ancef 2 g; Antibiotics were administered within 1 hour of the procedure. Glucagon 1 mg ANESTHESIA/SEDATION: Moderate (conscious) sedation was employed during this procedure. A total of Versed 2mg  and fentanyl 100 mcg was administered intravenously at the order of the provider performing the procedure. Total intra-service moderate sedation time: 42 minutes. Patient's level of consciousness and vital signs were monitored continuously by radiology nurse throughout the procedure under the supervision of the provider performing the procedure. FLUOROSCOPY: Radiation Exposure Index (as provided by the fluoroscopic device): 40 mGy Kerma COMPLICATIONS: None immediate. PROCEDURE: The procedure was explained to the patient. The risks and benefits of the procedure were discussed and the patient's questions were addressed. Informed consent was obtained from  the patient. The patient was placed on the interventional table. An orogastric tube was placed with fluoroscopic guidance. Stomach was insufflated through the orogastric tube. Small percutaneous window was identified along the abdominal midline. The anterior abdomen was prepped and draped in sterile fashion. Maximal barrier sterile technique was utilized including caps, mask, sterile gowns, sterile gloves, sterile drape, hand hygiene and skin antiseptic. The patient was biting on the orogastric tube and the orogastric tube had to be removed. Attempted to place another orogastric tube but the tube was preferentially going down the airway and another orogastric tube could not be placed. Therefore, the existing nasogastric tube was pulled back into the stomach body. The stomach was insufflated with air. The skin and subcutaneous tissues were anesthetized with 1% lidocaine. A needle was directed into the distended stomach with fluoroscopic guidance. A wire was advanced into the stomach and a T fastener was deployed. An incision was made adjacent to the T fastener. The needle was directed back into the stomach using fluoroscopic guidance and contrast was injected to confirm placement in the stomach. A stiff Amplatz wire was advanced into the stomach. Tract was dilated to accommodate an 18 French peel-away sheath. 80 French Entuit gastrostomy tube was easily advanced over the wire and the peel-away sheath was removed. Balloon was inflated with 7 mL of saline. Contrast injection confirmed placement in the stomach. T fastener is still intact. Gastrostomy tube was flushed with saline and a dressing was placed. IMPRESSION: Successful fluoroscopic guided percutaneous gastrostomy tube placement. Patient has a 26 French balloon retention gastrostomy tube. Electronically Signed   By: Markus Daft M.D.   On: 02/09/2023 14:36   CT ABDOMEN WO CONTRAST  Result Date: 02/04/2023 CLINICAL DATA:  89 year old male referred for possible  gastrostomy EXAM: CT ABDOMEN WITHOUT CONTRAST TECHNIQUE: Multidetector CT imaging of the abdomen was performed following the standard protocol without IV contrast. RADIATION DOSE REDUCTION: This exam was performed according to the departmental dose-optimization program which includes automated exposure control, adjustment of the mA and/or kV according to patient size and/or use of iterative reconstruction technique. COMPARISON:  01/13/2023 FINDINGS: Lower chest: Linear opacities at the left greater than right lung bases, in the region of previous multifocal infection. Interval improvement in the mixed ground-glass and nodular opacities at the lung bases with some mild residual centrilobular opacities in the left lower lobe. No pleural effusion. Hepatobiliary: Unremarkable liver.  Unremarkable gallbladder. Pancreas: Unremarkable Spleen: Unremarkable Adrenals/Urinary Tract: Unremarkable visualized adrenal glands. No hydronephrosis or nephrolithiasis. Stomach/Bowel: Weighted tip enteric feeding tube crosses the stomach and terminates in the proximal duodenum. Stomach is decompressed. Unremarkable visualized small bowel. Some retained enteric contrast  within the visualized colon. The splenic flexure overlies the fundus of the stomach, with the transverse colon maintained in a usual anatomic arrangement with the greater curvature of the stomach. Vascular/Lymphatic: Atherosclerotic changes of the abdominal aorta. No adenopathy. Other: None Musculoskeletal: Degenerative changes of the spine. IMPRESSION: Near complete resolution of the previous multifocal infection at the lung bases with some residual scarring/atelectasis. Mild residual centrilobular nodularity in the left lower lobe compatible with resolving infection. No acute finding of the visualized abdomen. Aortic Atherosclerosis (ICD10-I70.0). Electronically Signed   By: Corrie Mckusick D.O.   On: 02/04/2023 10:49   DG Swallowing Func-Speech Pathology  Result Date:  01/31/2023 Table formatting from the original result was not included. Modified Barium Swallow Study Patient Details Name: Curtis Ramos MRN: [id number] Date of Birth: 01/31/1932 Today's Date: 01/31/2023 HPI/PMH: HPI: 89 year old man who [facility] ED [facility] via EMS after unwitnessed fall at work, patient did not have any complaints prior to fall. He was found down between a radiator and the wall, unresponsive with contusion and deformities to R side of his head. Initially with tachycardic with EMS, then bradycardic. ST elevation noted on EKG and Code STEMI was called, later called off as ST elevation had resolved. Patient lost pulses while in ED and became apneic with cardiac monitoring showing PEA arrest. CPR administered x 9 minutes with Epi x 4 and ROSC, patient never had a shockable rhythm. Intubated peri-arrest 2/28-3/3 then reintubated until 3/6.Marland KitchenPMHx significant for HTN, HFrEF with NICM (cMRI 12/2022 with severe LV dilation, EF 17%, LBBB, followed by Dr. Haroldine Laws), AFib (on Eliquis), MAI infection, GERD, hypothyroidism, history of thyroid CA. Clinical Impression: Clinical Impression: Pt is starting to show progress since initial MBS, in terms of his pharyngeal physiology as well as his overall mentation and respiratory status. Although he still has reduced base of tongue retraction, anterior hyoid movement, and pharyngeal peristalsis, he is starting to show improvement in these areas as well as his epiglottic inversion. This results in less pharyngeal residue and less invasion into the laryngeal vestibule. Silent aspiration occurred after the swallow with thin liquids and he had difficulty clearing his airway once aspiration had occurred. With other consistencies tested he had more of a tendency to penetrate in very small quantities, often reaching the vocal folds without attempts to spontaneously clear. When cued to cough he is able to expel penetrates further airway from his vocal folds and sometimes even  out of his laryngeal vestibule, but he often needs to be prompted to cough harder. A cough followed by a second swallowed helped to reduce some pharyngeal residue and reduced instances in which barium fell back into the laryngeal vestibule. Given reports of baseline dysphagia, suspect that there will always inherently be some risk of aspiration. Discussed options with pt's son, [name], and his wife, and they prefer to allow pt to start a modified diet with careful assistance for use of strategies to try to mitigate risk as much as possible. They would prefer to avoid a longer-term feeding tube if possible, acknowledging risks of that but also the comfort and joy pt gets from PO intake. MD is also in agreement with starting PO diet with precautions. SLP will continue to follow. Factors that may increase risk of adverse event in presence of aspiration ([place] 2021): Factors that may increase risk of adverse event in presence of aspiration ([place] 2021): Poor general health and/or compromised immunity; Frail or deconditioned; Weak cough; Presence of tubes (ETT, trach,  NG, etc.) Recommendations/Plan: Swallowing Evaluation Recommendations Swallowing Evaluation Recommendations Recommendations: PO diet PO Diet Recommendation: Dysphagia 1 (Pureed); Mildly thick liquids (Level 2, nectar thick) Liquid Administration via: Cup; Straw Medication Administration: Crushed with puree Supervision: Staff to assist with self-feeding; Full supervision/cueing for swallowing strategies Swallowing strategies  : Minimize environmental distractions; Slow rate; Small bites/sips; Hard cough after swallowing; Multiple dry swallows after each bite/sip (cough, then swallow) Postural changes: Position pt fully upright for meals Oral care recommendations: Oral care BID (2x/day) Treatment Plan Treatment Plan Treatment recommendations: Therapy as outlined in treatment plan below Follow-up recommendations: Acute inpatient rehab (3  hours/day) Functional status assessment: Patient has had a recent decline in their functional status and demonstrates the ability to make significant improvements in function in a reasonable and predictable amount of time. Treatment frequency: Min 2x/week Treatment duration: 2 weeks Interventions: Aspiration precaution training; Compensatory techniques; Patient/family education; Trials of upgraded texture/liquids; Diet toleration management by SLP; Respiratory muscle strength training Recommendations Recommendations for follow up therapy are one component of a multi-disciplinary discharge planning process, led by the attending physician.  Recommendations may be updated based on patient status, additional functional criteria and insurance authorization. Assessment: Orofacial Exam: Orofacial Exam Oral Cavity - Dentition: Adequate natural dentition Anatomy: Anatomy: Other (Comment) (often in a natural head tilt position) Thin Liquids: Thin Liquids (Level 0) Thin Liquids : Impaired Bolus delivery method: Spoon; Cup; Straw Thin Liquid - Impairment: Pharyngeal impairment Initiation of swallow : Valleculae Soft palate elevation: No bolus between soft palate (SP)/pharyngeal wall (PW) Laryngeal elevation: Complete superior movement of thyroid cartilage with complete approximation of arytenoids to epiglottic petiole Anterior hyoid excursion: Partial Epiglottic movement: Partial Laryngeal vestibule closure: Incomplete, narrow column air/contrast in laryngeal vestibule Pharyngeal stripping wave : Present - diminished Pharyngeal contraction (A/P view only): N/A Pharyngoesophageal segment opening: Partial distention/partial duration, partial obstruction of flow Tongue base retraction: Narrow column of contrast or air between tongue base and PPW Pharyngeal residue: Collection of residue within or on pharyngeal structures Location of pharyngeal residue: Tongue base; Valleculae; Pyriform sinuses Penetration/Aspiration Scale (PAS)  score: 7.  Material enters airway, passes BELOW cords and not ejected out despite cough attempt by patient; 8.  Material enters airway, passes BELOW cords without attempt by patient to eject out (silent aspiration)  Mildly Thick Liquids: Mildly thick liquids (Level 2, nectar thick) Mildly thick liquids (Level 2, nectar thick): Impaired Bolus delivery method: Spoon; Cup; Straw Mildly Thick Liquid - Impairment: Pharyngeal impairment Initiation of swallow : Valleculae Soft palate elevation: No bolus between soft palate (SP)/pharyngeal wall (PW) Laryngeal elevation: Complete superior movement of thyroid cartilage with complete approximation of arytenoids to epiglottic petiole Anterior hyoid excursion: Partial Epiglottic movement: Complete Laryngeal vestibule closure: Incomplete, narrow column air/contrast in laryngeal vestibule Pharyngeal stripping wave : Present - diminished Pharyngeal contraction (A/P view only): N/A Pharyngoesophageal segment opening: Partial distention/partial duration, partial obstruction of flow Tongue base retraction: Trace column of contrast or air between tongue base and PPW Pharyngeal residue: Collection of residue within or on pharyngeal structures Location of pharyngeal residue: Valleculae; Aryepiglottic folds; Pyriform sinuses Penetration/Aspiration Scale (PAS) score: 5.  Material enters airway, CONTACTS cords and not ejected out  Moderately Thick Liquids: Moderately thick liquids (Level 3, honey thick) Moderately thick liquids (Level 3, honey thick): Impaired Bolus delivery method: Spoon; Cup Moderately Thick Liquid - Impairment: Pharyngeal impairment Tongue control during bolus hold: Escape to lateral buccal cavity/floor of mouth Bolus transport/lingual motion: Delayed initiation of tongue motion (oral holding) Oral residue: Trace residue lining  oral structures Location of oral residue : Tongue Initiation of swallow : Valleculae Soft palate elevation: No bolus between soft palate  (SP)/pharyngeal wall (PW) Laryngeal elevation: Complete superior movement of thyroid cartilage with complete approximation of arytenoids to epiglottic petiole Anterior hyoid excursion: Partial Epiglottic movement: Complete Laryngeal vestibule closure: Incomplete, narrow column air/contrast in laryngeal vestibule Pharyngeal stripping wave : Present - diminished Pharyngeal contraction (A/P view only): N/A Pharyngoesophageal segment opening: Partial distention/partial duration, partial obstruction of flow Tongue base retraction: Trace column of contrast or air between tongue base and PPW Pharyngeal residue: Collection of residue within or on pharyngeal structures Location of pharyngeal residue: Tongue base; Valleculae; Aryepiglottic folds Penetration/Aspiration Scale (PAS) score: 5.  Material enters airway, CONTACTS cords and not ejected out  Puree: Puree Puree: Impaired Puree - Impairment: Pharyngeal impairment Initiation of swallow: Valleculae Soft palate elevation: No bolus between soft palate (SP)/pharyngeal wall (PW) Laryngeal elevation: Complete superior movement of thyroid cartilage with complete approximation of arytenoids to epiglottic petiole Anterior hyoid excursion: Partial Epiglottic movement: Complete Laryngeal vestibule closure: Incomplete, narrow column air/contrast in laryngeal vestibule Pharyngeal stripping wave : Present - diminished Pharyngeal contraction (A/P view only): N/A Pharyngoesophageal segment opening: Partial distention/partial duration, partial obstruction of flow Tongue base retraction: Trace column of contrast or air between tongue base and PPW Pharyngeal residue: Collection of residue within or on pharyngeal structures Location of pharyngeal residue: Valleculae Penetration/Aspiration Scale (PAS) score: 3.  Material enters airway, remains ABOVE vocal cords and not ejected out Solid: Solid Solid: Impaired Solid - Impairment: Oral Impairment; Pharyngeal impairment Lip Closure: -- (not  able to fully visualize) Bolus preparation/mastication: Slow prolonged chewing/mashing with complete recollection Bolus transport/lingual motion: Slow tongue motion Oral residue: Trace residue lining oral structures Location of oral residue : Tongue; Palate Initiation of swallow: Valleculae Soft palate elevation: No bolus between soft palate (SP)/pharyngeal wall (PW) Laryngeal elevation: Complete superior movement of thyroid cartilage with complete approximation of arytenoids to epiglottic petiole Anterior hyoid excursion: Partial Epiglottic movement: Complete Laryngeal vestibule closure: Incomplete, narrow column air/contrast in laryngeal vestibule Pharyngeal stripping wave : Present - diminished Pharyngeal contraction (A/P view only): N/A Pharyngoesophageal segment opening: Partial distention/partial duration, partial obstruction of flow Tongue base retraction: Trace column of contrast or air between tongue base and PPW Pharyngeal residue: Collection of residue within or on pharyngeal structures Location of pharyngeal residue: Valleculae Penetration/Aspiration Scale (PAS) score: 5.  Material enters airway, CONTACTS cords and not ejected out Pill: Pill Pill: Not Tested Compensatory Strategies: Compensatory Strategies Compensatory strategies: Yes Straw: Effective; Ineffective Effective Straw: Mildly thick liquid (Level 2, nectar thick) Ineffective Straw: Thin liquid (Level 0) Effortful swallow: Effective Effective Effortful Swallow: Mildly thick liquid (Level 2, nectar thick); Puree Ineffective Effortful Swallow: Mildly thick liquid (Level 2, nectar thick); Moderately thick liquid (Level 3, honey thick) Multiple swallows: Effective Effective Multiple Swallows: Mildly thick liquid (Level 2, nectar thick); Puree Ineffective Multiple Swallows: Mildly thick liquid (Level 2, nectar thick); Moderately thick liquid (Level 3, honey thick); Puree Chin tuck: -- (pt naturally in a chin tuck position for a lot of the study)  Ineffective Chin Tuck: Mildly thick liquid (Level 2, nectar thick); Moderately thick liquid (Level 3, honey thick); Thin liquid (Level 0); Puree   General Information: Caregiver present: No  Diet Prior to this Study: NPO; Cortrak/Small bore NG tube   Temperature : Normal   Respiratory Status: WFL   Supplemental O2: None (Room air)   History of Recent Intubation: Yes  Behavior/Cognition: Alert; Cooperative; Pleasant mood Self-Feeding  Abilities: Needs assist with self-feeding Baseline vocal quality/speech: Normal Volitional Cough: Able to elicit Volitional Swallow: Able to elicit Exam Limitations: Fatigue (pt said he was feeling "worn out") Goal Planning: Prognosis for improved oropharyngeal function: Good Barriers to Reach Goals: Cognitive deficits No data recorded Patient/Family Stated Goal: pt and family would like to avoid a PEG if possible Consulted and agree with results and recommendations: Patient; Family member/caregiver; Physician; Nurse Pain: Pain Assessment Pain Assessment: Faces Pain Score: 2 Faces Pain Scale: 0 Breathing: 0 Negative Vocalization: 0 Facial Expression: 0 Body Language: 0 Consolability: 0 PAINAD Score: 0 Facial Expression: 0 Body Movements: 0 Muscle Tension: 0 Compliance with ventilator (intubated pts.): N/A Vocalization (extubated pts.): 0 CPOT Total: 0 Pain Location: ribs with transitional movements Pain Descriptors / Indicators: Grimacing; Guarding Pain Intervention(s): Monitored during session End of Session: Start Time:SLP Start Time (ACUTE ONLY): [id number] Stop Time: SLP Stop Time (ACUTE ONLY): 1324 Time Calculation:SLP Time Calculation (min) (ACUTE ONLY): 19 min Charges: SLP Evaluations $ SLP Speech Visit: 1 Visit SLP Evaluations $BSS Swallow: 1 Procedure $MBS Swallow: 1 Procedure $Swallowing Treatment: 1 Procedure SLP visit diagnosis: SLP Visit Diagnosis: Dysphagia, oropharyngeal phase (R13.12) Past Medical History: Past Medical History: Diagnosis Date  Acute on chronic combined systolic  and diastolic CHF (congestive heart failure) ([place]) 03/19/2019  AKI (acute kidney injury) ([place]) 12/26/2014  Arthralgia 11/04/2014  Arthritis   "proabably"  Atypical mycobacterium infection   Cancer ([place])   family unaware of this hx on [id number]  Chronic systolic heart failure (HCC)   Decreased oral intake 12/26/2014  Decreased oral intake 12/26/2014  Depressed left ventricular ejection fraction 02/05/2020  Dilated cardiomyopathy ([place]) 03/19/2019  Ejection fraction 20 to 25% based on echocardiogram done by pulmonologist month ago.  DJD (degenerative joint disease) 10/07/2014  Dyspnea on exertion 03/19/2019  Essential hypertension 02/05/2020  GERD (gastroesophageal reflux disease)   Hammer toes of both feet 01/30/2018  History of thyroid cancer 12/16/2016  Hypercalcemia 12/26/2014  Hypothyroidism   Hypothyroidism associated with surgical procedure 10/07/2014  Lung mass   Myalgia 11/04/2014  Mycobacterium avium complex ([place])   Nail dystrophy 01/30/2018  Occult malignancy (HCC)   PAF (paroxysmal atrial fibrillation) ([place]) 02/05/2020  Persistent atrial fibrillation ([place]) 03/19/2019  Chads 2 Vascor equals 3  PONV (postoperative nausea and vomiting)   Pre-ulcerative calluses 01/30/2018  Pseudoaneurysm following procedure ([place]) 04/16/2020  Sensorineural hearing loss (SNHL), bilateral 08/11/2018  Shingles   Toenail fungus 01/30/2018  Wears glasses  Past Surgical History: Past Surgical History: Procedure Laterality Date  APPENDECTOMY    CATARACT EXTRACTION W/ INTRAOCULAR LENS  IMPLANT, BILATERAL Bilateral   COLONOSCOPY    INGUINAL HERNIA REPAIR Left   LEFT HEART CATH AND CORONARY ANGIOGRAPHY N/A 03/11/2020  Procedure: LEFT HEART CATH AND CORONARY ANGIOGRAPHY;  Surgeon: Jettie Booze, MD;  Location: [facility];  Service: Cardiovascular;  Laterality: N/A;  MASS EXCISION Right 08/27/2014  Procedure: EXCISION MASS RIGHT PALM/INDEX;  Surgeon: Leanora Cover, MD;  Location: [facility];  Service: Orthopedics;  Laterality: Right;   THYROIDECTOMY  2003 Osie Bond., M.A. CCC-SLP Acute Rehabilitation Services Office [phone] Secure chat preferred 01/31/2023, 5:33 PM  DG Swallowing Func-Speech Pathology  Result Date: 01/31/2023 Table formatting from the original result was not included. Modified Barium Swallow Study Patient Details Name: Curtis Ramos MRN: [id number] Date of Birth: 05/12/1932 Today's Date: 01/31/2023 HPI/PMH: HPI: 89 year old man who presented to [facility] ED 2/29 via EMS after unwitnessed fall at work, patient did not have any complaints prior to  fall. He was found down between a radiator and the wall, unresponsive with contusion and deformities to R side of his head. Initially with tachycardic with EMS, then bradycardic. ST elevation noted on EKG and Code STEMI was called, later called off as ST elevation had resolved. Patient lost pulses while in ED and became apneic with cardiac monitoring showing PEA arrest. CPR administered x 9 minutes with Epi x 4 and ROSC, patient never had a shockable rhythm. Intubated peri-arrest 2/28-3/3 then reintubated until 3/6.Marland KitchenPMHx significant for HTN, HFrEF with NICM (cMRI 12/2022 with severe LV dilation, EF 17%, LBBB, followed by Dr. Haroldine Laws), AFib (on Eliquis), MAI infection, GERD, hypothyroidism, history of thyroid CA. Clinical Impression: Clinical Impression: Pt demonstrates decreased strength for airway protection and coughing. Pt likely has a mild baseline dysphagia per his son's report. Currently pt has mild to moderate pharyngeal residue with most textures due to decreased base of tongue retraction, epiglottic deflection and pharyngeal peristalsis. There is additionally incomplete laryngeal clsoure and repeated aspiration during and after the swallow. Pt senses pentrate and aspirate and tries to clear, but efforts are weak and likely impacted by the effects of prolonged CPR and deconditioning. Pt is at risk of aspiration of all textures at this time. Recommend ptremain NPO but be offered ice  and SLP will start RMT to improve efficacy of cough. Factors that may increase risk of adverse event in presence of aspiration ([place] 2021): Factors that may increase risk of adverse event in presence of aspiration ([place] 2021): Weak cough; Presence of tubes (ETT, trach, NG, etc.); Frail or deconditioned Recommendations/Plan: Swallowing Evaluation Recommendations Swallowing Evaluation Recommendations Recommendations: Ice chips PRN after oral care Oral care recommendations: Oral care QID (4x/day) Treatment Plan Treatment Plan Treatment recommendations: Therapy as outlined in treatment plan below Treatment frequency: Min 2x/week Treatment duration: 2 weeks Recommendations Recommendations for follow up therapy are one component of a multi-disciplinary discharge planning process, led by the attending physician.  Recommendations may be updated based on patient status, additional functional criteria and insurance authorization. Assessment: Orofacial Exam: Orofacial Exam Oral Cavity - Dentition: Adequate natural dentition Anatomy: No data recorded Thin Liquids: Thin Liquids (Level 0) Thin Liquids : Impaired Bolus delivery method: Spoon Thin Liquid - Impairment: Pharyngeal impairment Initiation of swallow : Valleculae Soft palate elevation: No bolus between soft palate (SP)/pharyngeal wall (PW) Laryngeal elevation: Complete superior movement of thyroid cartilage with complete approximation of arytenoids to epiglottic petiole Anterior hyoid excursion: Partial Epiglottic movement: No inversion Laryngeal vestibule closure: Incomplete, narrow column air/contrast in laryngeal vestibule Pharyngeal stripping wave : Present - diminished Pharyngeal contraction (A/P view only): N/A Pharyngoesophageal segment opening: Partial distention/partial duration, partial obstruction of flow Tongue base retraction: Narrow column of contrast or air between tongue base and PPW Pharyngeal residue: Collection of residue within  or on pharyngeal structures Location of pharyngeal residue: Tongue base; Valleculae; Pyriform sinuses Penetration/Aspiration Scale (PAS) score: 7.  Material enters airway, passes BELOW cords and not ejected out despite cough attempt by patient; 8.  Material enters airway, passes BELOW cords without attempt by patient to eject out (silent aspiration)  Mildly Thick Liquids: Mildly thick liquids (Level 2, nectar thick) Mildly thick liquids (Level 2, nectar thick): Impaired Bolus delivery method: Cup; Straw Mildly Thick Liquid - Impairment: Pharyngeal impairment Initiation of swallow : Valleculae Soft palate elevation: No bolus between soft palate (SP)/pharyngeal wall (PW) Laryngeal elevation: Complete superior movement of thyroid cartilage with complete approximation of arytenoids to epiglottic petiole Anterior hyoid excursion: Complete  Epiglottic movement: Complete Laryngeal vestibule closure: Incomplete, narrow column air/contrast in laryngeal vestibule Pharyngeal stripping wave : Present - diminished Pharyngeal contraction (A/P view only): N/A Pharyngoesophageal segment opening: Partial distention/partial duration, partial obstruction of flow Tongue base retraction: Trace column of contrast or air between tongue base and PPW Pharyngeal residue: Collection of residue within or on pharyngeal structures Location of pharyngeal residue: Valleculae; Aryepiglottic folds; Pyriform sinuses Penetration/Aspiration Scale (PAS) score: 8.  Material enters airway, passes BELOW cords without attempt by patient to eject out (silent aspiration); 7.  Material enters airway, passes BELOW cords and not ejected out despite cough attempt by patient; 6.  Material enters airway, passes BELOW cords then ejected out  Moderately Thick Liquids: Moderately thick liquids (Level 3, honey thick) Moderately thick liquids (Level 3, honey thick): Impaired Bolus delivery method: Cup; Spoon Moderately Thick Liquid - Impairment: Pharyngeal impairment;  Oral Impairment Tongue control during bolus hold: Escape to lateral buccal cavity/floor of mouth Bolus transport/lingual motion: Delayed initiation of tongue motion (oral holding) Oral residue: Trace residue lining oral structures Location of oral residue : Tongue Initiation of swallow : Valleculae Soft palate elevation: No bolus between soft palate (SP)/pharyngeal wall (PW) Laryngeal elevation: Complete superior movement of thyroid cartilage with complete approximation of arytenoids to epiglottic petiole Anterior hyoid excursion: Complete Epiglottic movement: Complete Laryngeal vestibule closure: Incomplete, narrow column air/contrast in laryngeal vestibule Pharyngeal stripping wave : Present - diminished Pharyngeal contraction (A/P view only): N/A Pharyngoesophageal segment opening: Partial distention/partial duration, partial obstruction of flow Tongue base retraction: Trace column of contrast or air between tongue base and PPW Pharyngeal residue: Collection of residue within or on pharyngeal structures Location of pharyngeal residue: Tongue base; Valleculae  Puree: Puree Puree: Impaired (see prior) Solid: No data recorded Pill: Pill Pill: Not Tested Compensatory Strategies: Compensatory Strategies Compensatory strategies: Yes Straw: Ineffective Ineffective Straw: Mildly thick liquid (Level 2, nectar thick) Effortful swallow: Ineffective Ineffective Effortful Swallow: Mildly thick liquid (Level 2, nectar thick); Moderately thick liquid (Level 3, honey thick) Multiple swallows: Ineffective Ineffective Multiple Swallows: Mildly thick liquid (Level 2, nectar thick); Moderately thick liquid (Level 3, honey thick); Puree Chin tuck: Ineffective Ineffective Chin Tuck: Mildly thick liquid (Level 2, nectar thick); Moderately thick liquid (Level 3, honey thick); Thin liquid (Level 0); Puree   General Information: Caregiver present: Yes  Diet Prior to this Study: NPO; Cortrak/Small bore NG tube   Temperature : Normal    Respiratory Status: WFL   Supplemental O2: None (Room air)   History of Recent Intubation: Yes  Behavior/Cognition: Alert; Cooperative; Pleasant mood Self-Feeding Abilities: Needs assist with self-feeding Baseline vocal quality/speech: Dysphonic Volitional Cough: Able to elicit Volitional Swallow: Able to elicit No data recorded Goal Planning: Prognosis for improved oropharyngeal function: Good No data recorded No data recorded No data recorded No data recorded Pain: Pain Assessment Pain Assessment: No/denies pain Pain Score: 2 Faces Pain Scale: 0 Breathing: 0 Negative Vocalization: 0 Facial Expression: 0 Body Language: 0 Consolability: 0 PAINAD Score: 0 Facial Expression: 0 Body Movements: 0 Muscle Tension: 0 Compliance with ventilator (intubated pts.): N/A Vocalization (extubated pts.): 0 CPOT Total: 0 Pain Location: ribs with transitional movements Pain Descriptors / Indicators: Grimacing; Guarding Pain Intervention(s): Monitored during session End of Session: Start Time:SLP Start Time (ACUTE ONLY): 1000 Stop Time: SLP Stop Time (ACUTE ONLY): 1020 Time Calculation:SLP Time Calculation (min) (ACUTE ONLY): 20 min Charges: SLP Evaluations $ SLP Speech Visit: 1 Visit SLP Evaluations $BSS Swallow: 1 Procedure $MBS Swallow: 1 Procedure $Swallowing Treatment: 1  Procedure SLP visit diagnosis: SLP Visit Diagnosis: Dysphagia, oropharyngeal phase (R13.12) Past Medical History: Past Medical History: Diagnosis Date  Acute on chronic combined systolic and diastolic CHF (congestive heart failure) ([place]) 03/19/2019  AKI (acute kidney injury) ([place]) 12/26/2014  Arthralgia 11/04/2014  Arthritis   "proabably"  Atypical mycobacterium infection   Cancer [facility])   family unaware of this hx on [id number]  Chronic systolic heart failure (HCC)   Decreased oral intake 12/26/2014  Decreased oral intake 12/26/2014  Depressed left ventricular ejection fraction 02/05/2020  Dilated cardiomyopathy ([place]) 03/19/2019  Ejection fraction 20 to 25% based on  echocardiogram done by pulmonologist month ago.  DJD (degenerative joint disease) 10/07/2014  Dyspnea on exertion 03/19/2019  Essential hypertension 02/05/2020  GERD (gastroesophageal reflux disease)   Hammer toes of both feet 01/30/2018  History of thyroid cancer 12/16/2016  Hypercalcemia 12/26/2014  Hypothyroidism   Hypothyroidism associated with surgical procedure 10/07/2014  Lung mass   Myalgia 11/04/2014  Mycobacterium avium complex ([place])   Nail dystrophy 01/30/2018  Occult malignancy (HCC)   PAF (paroxysmal atrial fibrillation) ([place]) 02/05/2020  Persistent atrial fibrillation ([place]) 03/19/2019  Chads 2 Vascor equals 3  PONV (postoperative nausea and vomiting)   Pre-ulcerative calluses 01/30/2018  Pseudoaneurysm following procedure ([place]) 04/16/2020  Sensorineural hearing loss (SNHL), bilateral 08/11/2018  Shingles   Toenail fungus 01/30/2018  Wears glasses  Past Surgical History: Past Surgical History: Procedure Laterality Date  APPENDECTOMY    CATARACT EXTRACTION W/ INTRAOCULAR LENS  IMPLANT, BILATERAL Bilateral   COLONOSCOPY    INGUINAL HERNIA REPAIR Left   LEFT HEART CATH AND CORONARY ANGIOGRAPHY N/A 03/11/2020  Procedure: LEFT HEART CATH AND CORONARY ANGIOGRAPHY;  Surgeon: Jettie Booze, MD;  Location: [place];  Service: Cardiovascular;  Laterality: N/A;  MASS EXCISION Right 08/27/2014  Procedure: EXCISION MASS RIGHT PALM/INDEX;  Surgeon: Leanora Cover, MD;  Location: [place];  Service: Orthopedics;  Laterality: Right;  THYROIDECTOMY  2003 Herbie [place], MA CCC-SLP Acute Rehabilitation Services Secure Chat Preferred Office [phone] Lynann Beaver 01/31/2023, 3:02 PM  US Abdomen Limited RUQ (LIVER/GB)  Result Date: 01/25/2023 CLINICAL DATA:  Elevated liver enzymes. EXAM: ULTRASOUND ABDOMEN LIMITED RIGHT UPPER QUADRANT COMPARISON:  01/13/2023. FINDINGS: Gallbladder: No gallstones or wall thickening visualized. No sonographic Murphy sign noted by sonographer. Common bile duct:  Diameter: 3.3 mm. Liver: No focal lesion identified. Within normal limits in parenchymal echogenicity. Portal vein is patent on color Doppler imaging with normal direction of blood flow towards the liver. Other: No free fluid.  A right pleural effusion is noted. IMPRESSION: 1. No cholelithiasis or acute cholecystitis. 2. Normal liver. 3. Right pleural effusion. Electronically Signed   By: Brett Fairy M.D.   On: 01/25/2023 20:19   DG CHEST PORT 1 VIEW  Result Date: 01/22/2023 CLINICAL DATA:  Pneumonia EXAM: PORTABLE CHEST 1 VIEW COMPARISON:  01/18/2023 FINDINGS: Interval extubation. Enteric tube is again seen with distal tip beyond the inferior margin of the film. Stable heart size. Progressive patchy bilateral airspace opacities most pronounced in the right upper lobe and left lung base. Probable small left pleural effusion. No pneumothorax. IMPRESSION: Progressive patchy bilateral airspace opacities most pronounced in the right upper lobe and left lung base. Electronically Signed   By: Davina Poke D.O.   On: 01/22/2023 11:20   DG CHEST PORT 1 VIEW  Result Date: 01/18/2023 CLINICAL DATA:  89 year old male with history of pneumonia. EXAM: PORTABLE CHEST 1 VIEW COMPARISON:  Chest x-ray 01/17/2023. FINDINGS: An endotracheal tube is in  place with tip 3.3 cm above the carina. There is a left-sided subclavian central venous catheter with tip terminating in the proximal superior vena cava. A feeding tube is seen extending into the abdomen, however, the tip of the feeding tube extends below the lower margin of the image. There continues to be areas of interstitial prominence, peribronchial cuffing and poorly defined airspace disease in the lungs (right-greater-than-left), however, the extent of airspace consolidation has improved compared to the prior study. No pleural effusions. No pneumothorax. No evidence of pulmonary edema. Heart size is mildly enlarged. Upper mediastinal contours are within normal limits.  Atherosclerotic calcifications are noted in the thoracic aorta. IMPRESSION: 1. Support apparatus, as above. 2. Improving aeration in the lungs with persistent areas of interstitial prominence and asymmetric airspace consolidation, most compatible with a resolving multilobar bilateral bronchopneumonia (right greater than left). 3. Mild cardiomegaly. 4. Aortic atherosclerosis. Electronically Signed   By: Vinnie Langton M.D.   On: 01/18/2023 07:32   DG CHEST PORT 1 VIEW  Result Date: 01/17/2023 CLINICAL DATA:  Pulmonary edema EXAM: PORTABLE CHEST 1 VIEW COMPARISON:  Chest radiograph dated 01/16/2023 FINDINGS: Lines/tubes: Endotracheal tube tip projects 3.8 cm above the carina. Left-sided central venous catheter tip projects over the SVC. Enteric tube tip reaches the diaphragm and terminates below the field of view. Chest: Improved lung aeration with persistent right-greater-than-left patchy and interstitial opacities. Pleura: No pneumothorax.  Decreased right pleural effusion. Heart/mediastinum: Similar enlarged cardiomediastinal silhouette. Bones: No acute osseous abnormality. Surgical clips project over the cervical region. IMPRESSION: 1. Improved lung aeration with persistent right-greater-than-left patchy and interstitial opacitie, which may reflect a combination of pulmonary edema, atelectasis, and/or infection. 2. Decreased right pleural effusion. 3. Support lines and tubes as described. Electronically Signed   By: Darrin Nipper M.D.   On: 01/17/2023 14:46   DG Abd Portable 1V  Result Date: 01/17/2023 CLINICAL DATA:  Feeding tube placement EXAM: PORTABLE ABDOMEN - 1 VIEW COMPARISON:  01/17/2023 FINDINGS: Limited radiograph of the lower chest and upper abdomen was obtained for the purposes of enteric tube localization. A new large bore enteric tube is seen coursing below the diaphragm with distal tip terminating within the expected location of the distal stomach. Increasing bilateral perihilar opacities, right  greater than left. IMPRESSION: 1. New large bore enteric tube with distal tip terminating within the expected location of the distal stomach. 2. Increasing bilateral perihilar opacities, right greater than left. Electronically Signed   By: Davina Poke D.O.   On: 01/17/2023 10:07   DG Abd Portable 1V  Result Date: 01/17/2023 CLINICAL DATA:  [id number] Encounter for imaging study to confirm orogastric (OG) tube placement [id number] EXAM: PORTABLE ABDOMEN - 1 VIEW COMPARISON:  01/17/2023 at 0358 hours FINDINGS: Limited radiograph of the lower chest and upper abdomen was obtained for the purposes of enteric tube localization. Distal tip of the enteric tube projects within the proximal stomach, side port at the level of the GE junction. IMPRESSION: Distal tip of the enteric tube projects within the proximal stomach, side port at the level of the GE junction. Recommend at least 5 cm of advancement. Electronically Signed   By: Davina Poke D.O.   On: 01/17/2023 08:17   DG Abd Portable 1V  Result Date: 01/17/2023 CLINICAL DATA:  89 year old male with history of orogastric tube placement. EXAM: PORTABLE ABDOMEN - 1 VIEW COMPARISON:  Abdominal radiograph 01/16/2023. FINDINGS: Orogastric tube has been advanced slightly, now with the tip of the tube likely in the proximal stomach, although  the side port is still in the distal esophagus. Advancement of the tube an additional 10 cm is suggested for more optimal placement. Gaseous distention of the stomach is noted. IMPRESSION: 1. Support apparatus, as above. Electronically Signed   By: Vinnie Langton M.D.   On: 01/17/2023 07:17   DG Abd Portable 1V  Result Date: 01/17/2023 CLINICAL DATA:  OG tube placement EXAM: PORTABLE ABDOMEN - 1 VIEW COMPARISON:  01/16/2019 at 1530 hours FINDINGS: Enteric tube in the distal esophagus, just above the GE junction. Advancement approximately 10 cm is suggested. IMPRESSION: Enteric tube in the distal esophagus, just above the GE  junction. Advancement approximately 10 cm is suggested. Electronically Signed   By: Julian Hy M.D.   On: 01/17/2023 00:07   DG Abd Portable 1V  Result Date: 01/16/2023 CLINICAL DATA:  Enteric tube placement. EXAM: PORTABLE ABDOMEN - 1 VIEW COMPARISON:  Abdominal x-ray dated January 13, 2023. FINDINGS: Enteric tube tip in the mid to distal esophagus. Distended stomach. Normal bowel gas pattern. No acute osseous abnormality. IMPRESSION: 1. Enteric tube tip in the mid to distal esophagus. Recommend advancement. Electronically Signed   By: Titus Dubin M.D.   On: 01/16/2023 15:49   DG Chest Port 1 View  Result Date: 01/16/2023 CLINICAL DATA:  Intubation.  Respiratory failure. EXAM: PORTABLE CHEST 1 VIEW COMPARISON:  Chest x-ray from 2 days ago. FINDINGS: Endotracheal tube tip in good position 2.9 cm above the carina. Enteric tube tip in the mid to distal esophagus. Left subclavian central venous catheter with tip in the mid SVC. Unchanged cardiomegaly. Worsened diffuse interstitial thickening and hazy airspace opacities. New small right pleural effusion with right-greater-than-left basilar atelectasis. No pneumothorax. No acute osseous abnormality. IMPRESSION: 1. Appropriately positioned endotracheal tube. 2. Enteric tube tip in the mid to distal esophagus. Recommend advancement. 3. Worsened pulmonary edema and possible superimposed multifocal pneumonia. New small right pleural effusion. Electronically Signed   By: Titus Dubin M.D.   On: 01/16/2023 15:49   Overnight EEG with video  Result Date: 01/15/2023 Lora Havens, MD     01/16/2023  8:20 AM Patient Name: Curtis Ramos MRN: [id number] Epilepsy Attending: Lora Havens Referring Physician/Provider: Jacky Kindle, MD Duration: 01/14/2023 1159 to  01/15/2023 1159 Patient history: 89yo M s/p cardiac arrest. EEG to evaluate for seizure Level of alertness:  comatose AEDs during EEG study: Propofol Technical aspects: This EEG study was done with  scalp electrodes positioned according to the 10-20 International system of electrode placement. Electrical activity was reviewed with band pass filter of 1-70Hz , sensitivity of 7 uV/mm, display speed of 64mm/sec with a 60Hz  notched filter applied as appropriate. EEG data were recorded continuously and digitally stored.  Video monitoring was available and reviewed as appropriate. Description: EEG showed continuous generalized low amplitude 3 to 5 Hz theta-delta slowing.  Hyperventilation and photic stimulation were not performed.   Event button was pressed on 01/15/2023 for head drop, gaze upwards. Concomitant eeg before, during and after the event didn't show an eeg change to suggest seizure. ABNORMALITY - Continuous slow, generalized IMPRESSION: This study is suggestive of severe diffuse encephalopathy, nonspecific etiology. No seizures or epileptiform discharges were seen throughout the recording. Event button was pressed on 01/15/2023 for head drop, gaze upwards without concomitant eeg change. This was most likely not an epileptic event. Lora Havens   ECHOCARDIOGRAM COMPLETE  Result Date: 01/14/2023    ECHOCARDIOGRAM REPORT   Patient Name:   Curtis Ramos [facility] Date of Exam: 01/14/2023  Medical Rec #:  [id number]     Height:       71.0 in Accession #:    [id number]    Weight:       137.6 lb Date of Birth:  [birth year]     BSA:          1.799 m Patient Age:    26 years      BP:           102/59 mmHg Patient Gender: M             HR:           65 bpm. Exam Location:  Inpatient Procedure: 2D Echo, Cardiac Doppler, Color Doppler and Intracardiac            Opacification Agent                                 MODIFIED REPORT: This report was modified by Rudean Haskell MD on 01/14/2023 due to Spelling.  Indications:     Cardiac arrest I46.9  History:         Patient has no prior history of Echocardiogram examinations,                  most recent 09/22/2022. Cardiomyopathy and CHF,                  Arrythmias:Atrial  Fibrillation; Risk Factors:Hypertension.  Sonographer:     Ronny Flurry Referring Phys:  [id number] Lowella Dell REESE Diagnosing Phys: Rudean Haskell MD IMPRESSIONS  1. Left ventricular ejection fraction, by estimation, is 20%. The left ventricle has severely decreased function. The left ventricle demonstrates regional wall motion abnormalities (abnormal septal motion). The left ventricular internal cavity size was mildly dilated. No LV thrombus.  2. The mitral valve is normal in structure. Mild to moderate mitral valve regurgitation. No evidence of mitral stenosis.  3. The aortic valve is tricuspid. There is mild calcification of the aortic valve. There is mild thickening of the aortic valve. Aortic valve regurgitation is mild. Aortic valve sclerosis is present, with no evidence of aortic valve stenosis.  4. Right ventricular systolic function is normal. The right ventricular size is moderately enlarged.  5. The inferior vena cava is normal in size with greater than 50% respiratory variability, suggesting right atrial pressure of 3 mmHg.  6. Right atrial size was mildly dilated. FINDINGS  Left Ventricle: Left ventricular ejection fraction, by estimation, is 20%. The left ventricle has severely decreased function. The left ventricle demonstrates regional wall motion abnormalities. Definity contrast agent was given IV to delineate the left  ventricular endocardial borders. The left ventricular internal cavity size was mildly dilated. There is no left ventricular hypertrophy. Abnormal (paradoxical) septal motion, consistent with left bundle branch block. Left ventricular diastolic parameters are indeterminate. Right Ventricle: The right ventricular size is moderately enlarged. No increase in right ventricular wall thickness. Right ventricular systolic function is normal. Left Atrium: Left atrial size was normal in size. Right Atrium: Right atrial size was mildly dilated. Pericardium: There is no evidence of  pericardial effusion. Mitral Valve: The mitral valve is normal in structure. Mild to moderate mitral valve regurgitation. No evidence of mitral valve stenosis. Tricuspid Valve: The tricuspid valve is not well visualized. Tricuspid valve regurgitation is mild . No evidence of tricuspid stenosis. Aortic Valve: The aortic valve is tricuspid. There is mild calcification of the aortic valve. There is  mild thickening of the aortic valve. There is mild aortic valve annular calcification. Aortic valve regurgitation is mild. Aortic regurgitation PHT measures 844 msec. Aortic valve sclerosis is present, with no evidence of aortic valve stenosis. Aortic valve mean gradient measures 3.5 mmHg. Aortic valve peak gradient measures 7.3 mmHg. Aortic valve area, by VTI measures 2.57 cm. Pulmonic Valve: The pulmonic valve was not well visualized. Pulmonic valve regurgitation is not visualized. Aorta: The aortic root and ascending aorta are structurally normal, with no evidence of dilitation. Venous: The inferior vena cava is normal in size with greater than 50% respiratory variability, suggesting right atrial pressure of 3 mmHg. IAS/Shunts: No atrial level shunt detected by color flow Doppler.  LEFT VENTRICLE PLAX 2D LVIDd:         5.60 cm      Diastology LVIDs:         4.90 cm      LV e' medial:    4.37 cm/s LV PW:         1.10 cm      LV E/e' medial:  12.7 LV IVS:        1.00 cm      LV e' lateral:   5.68 cm/s LVOT diam:     2.30 cm      LV E/e' lateral: 9.8 LV SV:         57 LV SV Index:   31 LVOT Area:     4.15 cm  LV Volumes (MOD) LV vol d, MOD A2C: 157.0 ml LV vol d, MOD A4C: 196.0 ml LV vol s, MOD A2C: 123.0 ml LV vol s, MOD A4C: 143.0 ml LV SV MOD A2C:     34.0 ml LV SV MOD A4C:     196.0 ml LV SV MOD BP:      36.1 ml RIGHT VENTRICLE             IVC RV S prime:     11.90 cm/s  IVC diam: 1.90 cm TAPSE (M-mode): 1.8 cm LEFT ATRIUM             Index        RIGHT ATRIUM           Index LA diam:        3.70 cm 2.06 cm/m   RA  Area:     21.80 cm LA Vol (A2C):   38.2 ml 21.24 ml/m  RA Volume:   65.00 ml  36.14 ml/m LA Vol (A4C):   65.2 ml 36.25 ml/m LA Biplane Vol: 49.9 ml 27.74 ml/m  AORTIC VALVE AV Area (Vmax):    2.71 cm AV Area (Vmean):   2.50 cm AV Area (VTI):     2.57 cm AV Vmax:           135.50 cm/s AV Vmean:          86.200 cm/s AV VTI:            0.220 m AV Peak Grad:      7.3 mmHg AV Mean Grad:      3.5 mmHg LVOT Vmax:         88.47 cm/s LVOT Vmean:        51.767 cm/s LVOT VTI:          0.136 m LVOT/AV VTI ratio: 0.62 AI PHT:            844 msec  AORTA Ao Root diam: 3.60 cm Ao Asc diam:  3.30 cm  MITRAL VALVE               TRICUSPID VALVE MV Area (PHT): 2.97 cm    TR Peak grad:   25.8 mmHg MV Decel Time: 256 msec    TR Vmax:        254.00 cm/s MV E velocity: 55.45 cm/s MV A velocity: 42.20 cm/s  SHUNTS MV E/A ratio:  1.31        Systemic VTI:  0.14 m                            Systemic Diam: 2.30 cm Rudean Haskell MD Electronically signed by Rudean Haskell MD Signature Date/Time: 01/14/2023/5:14:08 PM    Final (Updated)    DG Chest Port 1 View  Result Date: 01/14/2023 CLINICAL DATA:  The tracheal tube placement. EXAM: PORTABLE CHEST 1 VIEW COMPARISON:  01/13/2023 FINDINGS: 0501 hours. Endotracheal tube tip is 5.4 cm above the base of the carina. The NG tube passes into the stomach although the distal tip position is not included on the film. Left subclavian central line tip overlies the low trachea at the midline mediastinum, stable. The cardio pericardial silhouette is enlarged. Diffuse interstitial opacity noted with patchy airspace disease in the right upper and lower lung as well as right upper lobe. No evidence for pneumothorax or substantial pleural effusion. Telemetry leads overlie the chest. IMPRESSION: 1. Endotracheal tube tip is 5.4 cm above the base of the carina. 2. Left subclavian central line tip overlies the low trachea at the midline mediastinum, possibly due to leftward patient rotation. Tip  position of this catheter cannot be confirmed on single plane chest x-ray. This catheter is new since CT scan yesterday. If there is concern for left subclavian catheter malposition, repeat CT or injection of a small amount of contrast under fluoro may prove helpful. Electronically Signed   By: Misty Stanley M.D.   On: 01/14/2023 05:59   DG CHEST PORT 1 VIEW  Result Date: 01/13/2023 CLINICAL DATA:  Central line placement EXAM: PORTABLE CHEST 1 VIEW COMPARISON:  Chest x-ray 01/13/2023 FINDINGS: Left-sided central venous catheter tip projects over the proximal SVC. Endotracheal tube tip is proximally 4.5 cm above the carina. Enteric tube extends below the diaphragm. Surgical clips overlie the neck. Cardiomediastinal silhouette is stable, the heart is mildly enlarged. Bilateral hazy and patchy airspace opacities have not significantly changed. There are small pleural effusions. There is no pneumothorax. Osseous structures are stable. IMPRESSION: 1. Left-sided central venous catheter tip projects over the proximal SVC. 2. Stable bilateral hazy and patchy airspace opacities. Electronically Signed   By: Ronney Asters M.D.   On: 01/13/2023 16:36   DG Abd 1 View  Result Date: 01/13/2023 CLINICAL DATA:  NG tube placement. EXAM: ABDOMEN - 1 VIEW COMPARISON:  CT scan, same date. FINDINGS: The NG tube tip is in the body region of the stomach. The upper abdominal bowel gas pattern is unremarkable. IMPRESSION: NG tube tip is in the body region of the stomach. Electronically Signed   By: Marijo Sanes M.D.   On: 01/13/2023 16:33   CT CHEST ABDOMEN PELVIS WO CONTRAST  Result Date: 01/13/2023 CLINICAL DATA:  Patient found down, cardiopulmonary resuscitation EXAM: CT CHEST, ABDOMEN AND PELVIS WITHOUT CONTRAST TECHNIQUE: Multidetector CT imaging of the chest, abdomen and pelvis was performed following the standard protocol without IV contrast. RADIATION DOSE REDUCTION: This exam was performed according to the departmental  dose-optimization program which includes  automated exposure control, adjustment of the mA and/or kV according to patient size and/or use of iterative reconstruction technique. COMPARISON:  CT examinations from 06/12/2022 FINDINGS: CT CHEST FINDINGS Cardiovascular: Cardiomegaly noted. Atherosclerotic calcification of the thoracic aorta and branch vessels. Today's exam is performed without IV contrast and accordingly vascular patency is not assessed. Mediastinum/Nodes: Orogastric tube terminates at the gastroesophageal junction, consider advancing 10 cm. Nasogastric tube satisfactorily positioned. Very small retrosternal hematoma as on image 33 series 2, associated with the nondisplaced transverse sternal fracture. Lungs/Pleura: Secondary pulmonary lobular interstitial thickening with scattered airspace opacities which have accentuated density dependently favoring acute pulmonary edema. More confluent airspace opacities are present in the lower lobes and posteriorly in the right upper lobe, and aspiration pneumonia is not excluded. Small areas of confluent nodularity for example in the right middle lobe on image 80 of series 4 and in the lingula on image 75 series 4, probably due to edema or infection although surveillance imaging will be recommended to exclude progression. No substantial pleural effusion. Musculoskeletal: In addition to more cephalad deformity in the sternal body from an old healed fracture, there is a transverse mid to lower sternal body fracture shown on image 64 series 6, extending in between the costosternal junctions of the third and fourth ribs. There are vertical fractures in the costal cartilage of the right second, third, fourth, fifth, sixth, and seventh ribs best appreciated on coronal images. There also fractures in the costal cartilage of the left anterior second, third, fourth, fifth, and sixth rib costal cartilage. In addition are fractures of the bony portions of the bilateral  anterior second, third, fourth, fifth, sixth, and seventh ribs. Chondrocalcinosis in both glenohumeral joints. Thoracic spondylosis. CT ABDOMEN PELVIS FINDINGS Hepatobiliary: Trace perihepatic ascites. Reduced sensitivity for hepatic laceration due to streak artifact and lack of IV contrast. Pancreas: Unremarkable Spleen: Unremarkable Adrenals/Urinary Tract: Unremarkable Stomach/Bowel: Unremarkable Vascular/Lymphatic: Atherosclerosis is present, including aortoiliac atherosclerotic disease. Reproductive: Small bilateral hydroceles. Suspected epididymal cysts or spermatoceles bilaterally. Other: No supplemental non-categorized findings. Musculoskeletal: Calcific tendinopathy proximally in both hamstring tendons. Stable grade 1 degenerative anterolisthesis at L3-4 and L4-5. Chondrocalcinosis of the acetabular labrum bilaterally. IMPRESSION: 1. Acute nondisplaced transverse fracture of the mid to lower sternal body. 2. Acute fractures to the costal cartilage of the bilateral second through sixth ribs and also the right seventh costal cartilage. There are also acute bilateral fractures of the bony anterior portions of the bilateral second through seventh ribs. 3. Secondary pulmonary lobular interstitial thickening with scattered airspace opacities favoring acute pulmonary edema. More confluent airspace opacities are present in the lower lobes and posteriorly in the right upper lobe, and aspiration pneumonia is not excluded. 4. Trace perihepatic ascites. 5. Orogastric tube terminates at the gastroesophageal junction, consider advancing 10 cm. 6. Small bilateral hydroceles. Suspected epididymal cysts or spermatoceles bilaterally. 7. Chondrocalcinosis in both glenohumeral joints, acetabular labrum, and both hamstring tendons. 8. Aortic atherosclerosis. Aortic Atherosclerosis (ICD10-I70.0). Electronically Signed   By: Van Clines M.D.   On: 01/13/2023 12:37   CT CERVICAL SPINE WO CONTRAST  Result Date:  01/13/2023 CLINICAL DATA:  Patient found down between a radiator and a wall. Cardiopulmonary resuscitation. Right hand injury. EXAM: CT CERVICAL SPINE WITHOUT CONTRAST TECHNIQUE: Multidetector CT imaging of the cervical spine was performed without intravenous contrast. Multiplanar CT image reconstructions were also generated. RADIATION DOSE REDUCTION: This exam was performed according to the departmental dose-optimization program which includes automated exposure control, adjustment of the mA and/or kV according to patient size and/or  use of iterative reconstruction technique. COMPARISON:  None Available. FINDINGS: Alignment: 2 mm free fused anterolisthesis at C4-5 with interbody and bilateral facet fusion. Skull base and vertebrae: Substantial spurring at the anterior C1-2 articulation. Notable pannus posterior to the odontoid. No cervical spine fracture or acute bony findings. Soft tissues and spinal canal: Edema and airspace opacities at the lung bases. Orogastric tube and endotracheal tube noted. Bilateral carotid atherosclerotic calcification. Thyroidectomy. Disc levels: Uncinate and facet spurring cause mild right foraminal impingement at C5-6 and C6-7 along with left foraminal impingement at C3-4, C4-5, and C5-6. There is moderate central narrowing of the thecal sac at C3-4 due to chronic disc osteophyte complex and left paracentral disc protrusion. Upper chest: Please see dedicated chest CT report. Other: No supplemental non-categorized findings. IMPRESSION: 1. No acute cervical spine findings. 2. Cervical spondylosis and degenerative disc disease causing multilevel foraminal impingement. There is also moderate central narrowing of the thecal sac at C3-4 due to chronic disc osteophyte complex and left paracentral disc protrusion. 3. 2 mm free fused anterolisthesis at C4-5. 4. Edema and airspace opacities at the lung bases. 5. Bilateral carotid atherosclerotic calcification. Electronically Signed   By: Van Clines M.D.   On: 01/13/2023 12:23   CT HEAD WO CONTRAST  Result Date: 01/13/2023 CLINICAL DATA:  Head trauma. EXAM: CT HEAD WITHOUT CONTRAST TECHNIQUE: Contiguous axial images were obtained from the base of the skull through the vertex without intravenous contrast. RADIATION DOSE REDUCTION: This exam was performed according to the departmental dose-optimization program which includes automated exposure control, adjustment of the mA and/or kV according to patient size and/or use of iterative reconstruction technique. COMPARISON:  Head MRI 06/30/2017 FINDINGS: Brain: There is no evidence of an acute infarct, intracranial hemorrhage, mass, midline shift, or extra-axial fluid collection. Mild cerebral atrophy is within normal limits for age. Cerebral white matter hypodensities are nonspecific but compatible with mild chronic small vessel ischemic disease. Vascular: Calcified atherosclerosis at the skull base. No hyperdense vessel. Skull: No acute fracture or suspicious osseous lesion. Sinuses/Orbits: Trace fluid in the right sphenoid sinus. Clear mastoid air cells. Bilateral cataract extraction. Other: Small to moderate-sized right parietal scalp hematoma and laceration. IMPRESSION: 1. No evidence of acute intracranial abnormality. 2. Right parietal scalp hematoma. Electronically Signed   By: Logan Bores M.D.   On: 01/13/2023 12:22   DG Pelvis Portable  Result Date: 01/13/2023 CLINICAL DATA:  Possible trauma, found unresponsive at work EXAM: PORTABLE PELVIS 1-2 VIEWS COMPARISON:  None Available. FINDINGS: No displaced fracture or dislocation is seen. Bony spurs are noted in both hips. Scattered vascular calcifications are seen in soft tissues. Degenerative changes are noted in visualized lower lumbar spine. IMPRESSION: No displaced fracture or dislocation is seen. Electronically Signed   By: Elmer Picker M.D.   On: 01/13/2023 12:10   DG Chest Port 1 View  Result Date: 01/13/2023 CLINICAL DATA:   Pain after trauma EXAM: PORTABLE CHEST 1 VIEW COMPARISON:  X-ray 06/12/2022 FINDINGS: ET tube in place with tip 5 cm above the carina enlarged cardiopericardial silhouette. Overlapping cardiac leads and defibrillator pads. Diffuse interstitial changes identified, possibly components of edema. Apical pleural thickening on the right-greater-than-left. No obvious pneumothorax or effusion of the inferior costophrenic angles are clipped off the edge of the film. Surgical clips along the thoracic inlet. IMPRESSION: ET tube in place. Hyperinflation with enlarged cardiopericardial silhouette and interstitial edema. Electronically Signed   By: Jill Side M.D.   On: 01/13/2023 12:04   (Echo, Carotid, EGD,  Colonoscopy, ERCP)    Subjective: Patient seen in the morning rounds.  Denies any events.   Discharge Exam: Vitals:   02/11/23 0448 02/11/23 0748  BP: 120/77 112/65  Pulse: 78 72  Resp: 17 16  Temp: 97.6 F (36.4 C) 98.9 F (37.2 C)  SpO2: 92% 94%   Vitals:   02/10/23 1513 02/10/23 2003 02/11/23 0448 02/11/23 0748  BP: (!) 98/49 (!) 96/57 120/77 112/65  Pulse: 76 75 78 72  Resp:  17 17 16   Temp: 97.8 F (36.6 C) 98.3 F (36.8 C) 97.6 F (36.4 C) 98.9 F (37.2 C)  TempSrc: Oral  Oral   SpO2: 97% 95% 92% 94%  Weight:      Height:        General exam: Appears calm and comfortable .  On room air. Respiratory system: No added Cardiovascular system: S1 & S2 heard, RRR. No pedal edema. Gastrointestinal system: Soft.  Nontender.  Bowel sound present.  PEG tube in place.  Dry and intact. Central nervous system: Alert and oriented. No focal neurological deficits.  Can move all extremities equally. Extremities: Symmetric 5 x 5 power.     The results of significant diagnostics from this hospitalization (including imaging, microbiology, ancillary and laboratory) are listed below for reference.     Microbiology: No results found for this or any previous visit (from the past 240 hour(s)).    Labs: BNP (last 3 results) Recent Labs    12/07/22 1307 12/31/22 1156 01/13/23 1344  BNP >4,500.0* 1,669.4* [id number]*   Basic Metabolic Panel: Recent Labs  Lab 02/05/23 0117 02/07/23 0455 02/10/23 0753 02/10/23 1758 02/11/23 0525  NA 137 132*  --   --   --   K 4.3 4.3  --   --   --   CL 108 103  --   --   --   CO2 23 23  --   --   --   GLUCOSE 107* 119*  --   --   --   BUN 37* 30*  --   --   --   CREATININE 1.09 0.94 1.20  --   --   CALCIUM 10.0 10.2  --   --   --   MG 1.9  --  2.1 2.2 2.1  PHOS 2.4*  --  3.2 2.8 2.5   Liver Function Tests: Recent Labs  Lab 02/05/23 0117 02/07/23 0455  AST  --  24  ALT  --  39  ALKPHOS  --  153*  BILITOT  --  0.7  PROT  --  7.0  ALBUMIN 2.6* 2.4*   No results for input(s): "LIPASE", "AMYLASE" in the last 168 hours. No results for input(s): "AMMONIA" in the last 168 hours. CBC: Recent Labs  Lab 02/04/23 1307 02/05/23 0117 02/07/23 0455  WBC 13.1* 13.3* 11.2*  NEUTROABS  --   --  8.8*  HGB 11.3* 11.0* 10.1*  HCT 34.1* 32.0* 31.2*  MCV 111.8* 113.9* 112.6*  PLT 374 347 281   Cardiac Enzymes: No results for input(s): "CKTOTAL", "CKMB", "CKMBINDEX", "TROPONINI" in the last 168 hours. BNP: Invalid input(s): "POCBNP" CBG: Recent Labs  Lab 02/10/23 2021 02/11/23 0007 02/11/23 0458 02/11/23 0748 02/11/23 1202  GLUCAP 124* 129* 81 123* 108*   D-Dimer No results for input(s): "DDIMER" in the last 72 hours. Hgb A1c No results for input(s): "HGBA1C" in the last 72 hours. Lipid Profile No results for input(s): "CHOL", "HDL", "LDLCALC", "TRIG", "CHOLHDL", "LDLDIRECT" in the last 72  hours. Thyroid function studies Recent Labs    02/11/23 0525  TSH 23.002*   Anemia work up No results for input(s): "VITAMINB12", "FOLATE", "FERRITIN", "TIBC", "IRON", "RETICCTPCT" in the last 72 hours. Urinalysis    Component Value Date/Time   COLORURINE YELLOW 01/13/2023 1334   APPEARANCEUR CLEAR 01/13/2023 1334   LABSPEC 1.020  01/13/2023 1334   PHURINE 6.0 01/13/2023 1334   GLUCOSEU >=500 (A) 01/13/2023 1334   HGBUR SMALL (A) 01/13/2023 1334   BILIRUBINUR NEGATIVE 01/13/2023 1334   KETONESUR NEGATIVE 01/13/2023 1334   PROTEINUR 30 (A) 01/13/2023 1334   NITRITE NEGATIVE 01/13/2023 1334   LEUKOCYTESUR NEGATIVE 01/13/2023 1334   Sepsis Labs Recent Labs  Lab 02/04/23 1307 02/05/23 0117 02/07/23 0455  WBC 13.1* 13.3* 11.2*   Microbiology No results found for this or any previous visit (from the past 240 hour(s)).   Time coordinating discharge:  35 minutes  SIGNED:   Barb Merino, MD  [facility] 02/11/2023, 12:42 PM

## 2023-02-11 NOTE — Progress Notes (Signed)
Report called to [place] at [employer].

## 2023-02-16 ENCOUNTER — Other Ambulatory Visit: Payer: Self-pay | Admitting: *Deleted

## 2023-02-16 NOTE — Patient Outreach (Signed)
Mr. Rodela recently admitted to [facility]. Screening for potential [facility] care coordination services as benefit of health plan and  PCP.  Met with [place] work team. Mr. Bart is from home.  Previously independent. Transition plan pending progress with therapy.   Will continue to follow.   Marthenia Rolling, MSN, RN,BSN [place] Acute Care Coordinator [phone] (Direct dial)

## 2023-03-02 ENCOUNTER — Other Ambulatory Visit: Payer: Self-pay | Admitting: Cardiology

## 2023-03-08 ENCOUNTER — Other Ambulatory Visit (HOSPITAL_COMMUNITY): Payer: Medicare Other

## 2023-03-14 ENCOUNTER — Other Ambulatory Visit: Payer: Self-pay | Admitting: *Deleted

## 2023-03-14 NOTE — Patient Outreach (Signed)
[facility] Post- Acute Care Coordinator follow up. Screening for potential [facility] care coordination services as benefit of health plan and PCP.  Per [facility] Mr. Kendall transitioned to [facility] under private pay from [facility] on 03/14/23. Confirmed transition plans with [facility] discharge planner.   No identifiable [facility] care coordination needs.   Raiford Noble, MSN, RN,BSN [facility] Post Acute Care Coordinator [phone] (Direct dial)

## 2023-03-16 DIAGNOSIS — J9601 Acute respiratory failure with hypoxia: Secondary | ICD-10-CM | POA: Diagnosis not present

## 2023-03-16 DIAGNOSIS — M6281 Muscle weakness (generalized): Secondary | ICD-10-CM | POA: Diagnosis not present

## 2023-03-16 DIAGNOSIS — R1312 Dysphagia, oropharyngeal phase: Secondary | ICD-10-CM | POA: Diagnosis not present

## 2023-03-16 DIAGNOSIS — R54 Age-related physical debility: Secondary | ICD-10-CM | POA: Diagnosis not present

## 2023-03-16 DIAGNOSIS — I509 Heart failure, unspecified: Secondary | ICD-10-CM | POA: Diagnosis not present

## 2023-03-16 DIAGNOSIS — H903 Sensorineural hearing loss, bilateral: Secondary | ICD-10-CM | POA: Diagnosis not present

## 2023-03-16 DIAGNOSIS — R2689 Other abnormalities of gait and mobility: Secondary | ICD-10-CM | POA: Diagnosis not present

## 2023-03-16 DIAGNOSIS — Z931 Gastrostomy status: Secondary | ICD-10-CM | POA: Diagnosis not present

## 2023-03-16 DIAGNOSIS — R5381 Other malaise: Secondary | ICD-10-CM | POA: Diagnosis not present

## 2023-03-16 DIAGNOSIS — L988 Other specified disorders of the skin and subcutaneous tissue: Secondary | ICD-10-CM | POA: Diagnosis not present

## 2023-03-16 DIAGNOSIS — I5022 Chronic systolic (congestive) heart failure: Secondary | ICD-10-CM | POA: Diagnosis not present

## 2023-03-16 DIAGNOSIS — R2681 Unsteadiness on feet: Secondary | ICD-10-CM | POA: Diagnosis not present

## 2023-03-16 DIAGNOSIS — E441 Mild protein-calorie malnutrition: Secondary | ICD-10-CM | POA: Diagnosis not present

## 2023-03-16 DIAGNOSIS — D649 Anemia, unspecified: Secondary | ICD-10-CM | POA: Diagnosis not present

## 2023-03-17 DIAGNOSIS — R2681 Unsteadiness on feet: Secondary | ICD-10-CM | POA: Diagnosis not present

## 2023-03-17 DIAGNOSIS — J9601 Acute respiratory failure with hypoxia: Secondary | ICD-10-CM | POA: Diagnosis not present

## 2023-03-17 DIAGNOSIS — R2689 Other abnormalities of gait and mobility: Secondary | ICD-10-CM | POA: Diagnosis not present

## 2023-03-17 DIAGNOSIS — R54 Age-related physical debility: Secondary | ICD-10-CM | POA: Diagnosis not present

## 2023-03-17 DIAGNOSIS — H903 Sensorineural hearing loss, bilateral: Secondary | ICD-10-CM | POA: Diagnosis not present

## 2023-03-17 DIAGNOSIS — M6281 Muscle weakness (generalized): Secondary | ICD-10-CM | POA: Diagnosis not present

## 2023-03-17 DIAGNOSIS — I5022 Chronic systolic (congestive) heart failure: Secondary | ICD-10-CM | POA: Diagnosis not present

## 2023-03-17 DIAGNOSIS — R1312 Dysphagia, oropharyngeal phase: Secondary | ICD-10-CM | POA: Diagnosis not present

## 2023-03-17 DIAGNOSIS — Z931 Gastrostomy status: Secondary | ICD-10-CM | POA: Diagnosis not present

## 2023-03-18 DIAGNOSIS — R2681 Unsteadiness on feet: Secondary | ICD-10-CM | POA: Diagnosis not present

## 2023-03-18 DIAGNOSIS — Z931 Gastrostomy status: Secondary | ICD-10-CM | POA: Diagnosis not present

## 2023-03-18 DIAGNOSIS — R2689 Other abnormalities of gait and mobility: Secondary | ICD-10-CM | POA: Diagnosis not present

## 2023-03-18 DIAGNOSIS — I5022 Chronic systolic (congestive) heart failure: Secondary | ICD-10-CM | POA: Diagnosis not present

## 2023-03-18 DIAGNOSIS — J9601 Acute respiratory failure with hypoxia: Secondary | ICD-10-CM | POA: Diagnosis not present

## 2023-03-18 DIAGNOSIS — M6281 Muscle weakness (generalized): Secondary | ICD-10-CM | POA: Diagnosis not present

## 2023-03-18 DIAGNOSIS — R54 Age-related physical debility: Secondary | ICD-10-CM | POA: Diagnosis not present

## 2023-03-18 DIAGNOSIS — R1312 Dysphagia, oropharyngeal phase: Secondary | ICD-10-CM | POA: Diagnosis not present

## 2023-03-18 DIAGNOSIS — H903 Sensorineural hearing loss, bilateral: Secondary | ICD-10-CM | POA: Diagnosis not present

## 2023-03-21 DIAGNOSIS — R2689 Other abnormalities of gait and mobility: Secondary | ICD-10-CM | POA: Diagnosis not present

## 2023-03-21 DIAGNOSIS — R54 Age-related physical debility: Secondary | ICD-10-CM | POA: Diagnosis not present

## 2023-03-21 DIAGNOSIS — M6281 Muscle weakness (generalized): Secondary | ICD-10-CM | POA: Diagnosis not present

## 2023-03-21 DIAGNOSIS — I5022 Chronic systolic (congestive) heart failure: Secondary | ICD-10-CM | POA: Diagnosis not present

## 2023-03-21 DIAGNOSIS — R1312 Dysphagia, oropharyngeal phase: Secondary | ICD-10-CM | POA: Diagnosis not present

## 2023-03-21 DIAGNOSIS — Z931 Gastrostomy status: Secondary | ICD-10-CM | POA: Diagnosis not present

## 2023-03-21 DIAGNOSIS — J9601 Acute respiratory failure with hypoxia: Secondary | ICD-10-CM | POA: Diagnosis not present

## 2023-03-21 DIAGNOSIS — H903 Sensorineural hearing loss, bilateral: Secondary | ICD-10-CM | POA: Diagnosis not present

## 2023-03-21 DIAGNOSIS — R2681 Unsteadiness on feet: Secondary | ICD-10-CM | POA: Diagnosis not present

## 2023-03-22 DIAGNOSIS — H903 Sensorineural hearing loss, bilateral: Secondary | ICD-10-CM | POA: Diagnosis not present

## 2023-03-22 DIAGNOSIS — R2689 Other abnormalities of gait and mobility: Secondary | ICD-10-CM | POA: Diagnosis not present

## 2023-03-22 DIAGNOSIS — M6281 Muscle weakness (generalized): Secondary | ICD-10-CM | POA: Diagnosis not present

## 2023-03-22 DIAGNOSIS — R2681 Unsteadiness on feet: Secondary | ICD-10-CM | POA: Diagnosis not present

## 2023-03-22 DIAGNOSIS — J9601 Acute respiratory failure with hypoxia: Secondary | ICD-10-CM | POA: Diagnosis not present

## 2023-03-22 DIAGNOSIS — I5022 Chronic systolic (congestive) heart failure: Secondary | ICD-10-CM | POA: Diagnosis not present

## 2023-03-22 DIAGNOSIS — Z931 Gastrostomy status: Secondary | ICD-10-CM | POA: Diagnosis not present

## 2023-03-22 DIAGNOSIS — R54 Age-related physical debility: Secondary | ICD-10-CM | POA: Diagnosis not present

## 2023-03-22 DIAGNOSIS — I10 Essential (primary) hypertension: Secondary | ICD-10-CM | POA: Diagnosis not present

## 2023-03-22 DIAGNOSIS — R1312 Dysphagia, oropharyngeal phase: Secondary | ICD-10-CM | POA: Diagnosis not present

## 2023-03-23 DIAGNOSIS — M6281 Muscle weakness (generalized): Secondary | ICD-10-CM | POA: Diagnosis not present

## 2023-03-23 DIAGNOSIS — J9601 Acute respiratory failure with hypoxia: Secondary | ICD-10-CM | POA: Diagnosis not present

## 2023-03-23 DIAGNOSIS — R2689 Other abnormalities of gait and mobility: Secondary | ICD-10-CM | POA: Diagnosis not present

## 2023-03-23 DIAGNOSIS — R1312 Dysphagia, oropharyngeal phase: Secondary | ICD-10-CM | POA: Diagnosis not present

## 2023-03-23 DIAGNOSIS — R2681 Unsteadiness on feet: Secondary | ICD-10-CM | POA: Diagnosis not present

## 2023-03-23 DIAGNOSIS — R54 Age-related physical debility: Secondary | ICD-10-CM | POA: Diagnosis not present

## 2023-03-23 DIAGNOSIS — I5022 Chronic systolic (congestive) heart failure: Secondary | ICD-10-CM | POA: Diagnosis not present

## 2023-03-23 DIAGNOSIS — Z931 Gastrostomy status: Secondary | ICD-10-CM | POA: Diagnosis not present

## 2023-03-23 DIAGNOSIS — H903 Sensorineural hearing loss, bilateral: Secondary | ICD-10-CM | POA: Diagnosis not present

## 2023-03-24 DIAGNOSIS — R2681 Unsteadiness on feet: Secondary | ICD-10-CM | POA: Diagnosis not present

## 2023-03-24 DIAGNOSIS — J9601 Acute respiratory failure with hypoxia: Secondary | ICD-10-CM | POA: Diagnosis not present

## 2023-03-24 DIAGNOSIS — H903 Sensorineural hearing loss, bilateral: Secondary | ICD-10-CM | POA: Diagnosis not present

## 2023-03-24 DIAGNOSIS — I5022 Chronic systolic (congestive) heart failure: Secondary | ICD-10-CM | POA: Diagnosis not present

## 2023-03-24 DIAGNOSIS — R2689 Other abnormalities of gait and mobility: Secondary | ICD-10-CM | POA: Diagnosis not present

## 2023-03-24 DIAGNOSIS — R1312 Dysphagia, oropharyngeal phase: Secondary | ICD-10-CM | POA: Diagnosis not present

## 2023-03-24 DIAGNOSIS — R54 Age-related physical debility: Secondary | ICD-10-CM | POA: Diagnosis not present

## 2023-03-24 DIAGNOSIS — M6281 Muscle weakness (generalized): Secondary | ICD-10-CM | POA: Diagnosis not present

## 2023-03-24 DIAGNOSIS — Z931 Gastrostomy status: Secondary | ICD-10-CM | POA: Diagnosis not present

## 2023-03-25 DIAGNOSIS — H903 Sensorineural hearing loss, bilateral: Secondary | ICD-10-CM | POA: Diagnosis not present

## 2023-03-25 DIAGNOSIS — R2689 Other abnormalities of gait and mobility: Secondary | ICD-10-CM | POA: Diagnosis not present

## 2023-03-25 DIAGNOSIS — Z931 Gastrostomy status: Secondary | ICD-10-CM | POA: Diagnosis not present

## 2023-03-25 DIAGNOSIS — I5022 Chronic systolic (congestive) heart failure: Secondary | ICD-10-CM | POA: Diagnosis not present

## 2023-03-25 DIAGNOSIS — R1312 Dysphagia, oropharyngeal phase: Secondary | ICD-10-CM | POA: Diagnosis not present

## 2023-03-25 DIAGNOSIS — R2681 Unsteadiness on feet: Secondary | ICD-10-CM | POA: Diagnosis not present

## 2023-03-25 DIAGNOSIS — M6281 Muscle weakness (generalized): Secondary | ICD-10-CM | POA: Diagnosis not present

## 2023-03-25 DIAGNOSIS — R54 Age-related physical debility: Secondary | ICD-10-CM | POA: Diagnosis not present

## 2023-03-25 DIAGNOSIS — J9601 Acute respiratory failure with hypoxia: Secondary | ICD-10-CM | POA: Diagnosis not present

## 2023-03-26 DIAGNOSIS — R2689 Other abnormalities of gait and mobility: Secondary | ICD-10-CM | POA: Diagnosis not present

## 2023-03-26 DIAGNOSIS — R54 Age-related physical debility: Secondary | ICD-10-CM | POA: Diagnosis not present

## 2023-03-26 DIAGNOSIS — R2681 Unsteadiness on feet: Secondary | ICD-10-CM | POA: Diagnosis not present

## 2023-03-26 DIAGNOSIS — I5022 Chronic systolic (congestive) heart failure: Secondary | ICD-10-CM | POA: Diagnosis not present

## 2023-03-26 DIAGNOSIS — H903 Sensorineural hearing loss, bilateral: Secondary | ICD-10-CM | POA: Diagnosis not present

## 2023-03-26 DIAGNOSIS — J9601 Acute respiratory failure with hypoxia: Secondary | ICD-10-CM | POA: Diagnosis not present

## 2023-03-26 DIAGNOSIS — Z931 Gastrostomy status: Secondary | ICD-10-CM | POA: Diagnosis not present

## 2023-03-26 DIAGNOSIS — R1312 Dysphagia, oropharyngeal phase: Secondary | ICD-10-CM | POA: Diagnosis not present

## 2023-03-26 DIAGNOSIS — M6281 Muscle weakness (generalized): Secondary | ICD-10-CM | POA: Diagnosis not present

## 2023-03-27 DIAGNOSIS — Z931 Gastrostomy status: Secondary | ICD-10-CM | POA: Diagnosis not present

## 2023-03-27 DIAGNOSIS — J9601 Acute respiratory failure with hypoxia: Secondary | ICD-10-CM | POA: Diagnosis not present

## 2023-03-27 DIAGNOSIS — H903 Sensorineural hearing loss, bilateral: Secondary | ICD-10-CM | POA: Diagnosis not present

## 2023-03-27 DIAGNOSIS — R2689 Other abnormalities of gait and mobility: Secondary | ICD-10-CM | POA: Diagnosis not present

## 2023-03-27 DIAGNOSIS — I5022 Chronic systolic (congestive) heart failure: Secondary | ICD-10-CM | POA: Diagnosis not present

## 2023-03-27 DIAGNOSIS — R2681 Unsteadiness on feet: Secondary | ICD-10-CM | POA: Diagnosis not present

## 2023-03-27 DIAGNOSIS — R1312 Dysphagia, oropharyngeal phase: Secondary | ICD-10-CM | POA: Diagnosis not present

## 2023-03-27 DIAGNOSIS — M6281 Muscle weakness (generalized): Secondary | ICD-10-CM | POA: Diagnosis not present

## 2023-03-27 DIAGNOSIS — R54 Age-related physical debility: Secondary | ICD-10-CM | POA: Diagnosis not present

## 2023-03-28 DIAGNOSIS — J9601 Acute respiratory failure with hypoxia: Secondary | ICD-10-CM | POA: Diagnosis not present

## 2023-03-28 DIAGNOSIS — R1312 Dysphagia, oropharyngeal phase: Secondary | ICD-10-CM | POA: Diagnosis not present

## 2023-03-28 DIAGNOSIS — I5022 Chronic systolic (congestive) heart failure: Secondary | ICD-10-CM | POA: Diagnosis not present

## 2023-03-28 DIAGNOSIS — R2681 Unsteadiness on feet: Secondary | ICD-10-CM | POA: Diagnosis not present

## 2023-03-28 DIAGNOSIS — Z931 Gastrostomy status: Secondary | ICD-10-CM | POA: Diagnosis not present

## 2023-03-28 DIAGNOSIS — M6281 Muscle weakness (generalized): Secondary | ICD-10-CM | POA: Diagnosis not present

## 2023-03-28 DIAGNOSIS — H903 Sensorineural hearing loss, bilateral: Secondary | ICD-10-CM | POA: Diagnosis not present

## 2023-03-28 DIAGNOSIS — R54 Age-related physical debility: Secondary | ICD-10-CM | POA: Diagnosis not present

## 2023-03-28 DIAGNOSIS — R2689 Other abnormalities of gait and mobility: Secondary | ICD-10-CM | POA: Diagnosis not present

## 2023-03-29 DIAGNOSIS — Z931 Gastrostomy status: Secondary | ICD-10-CM | POA: Diagnosis not present

## 2023-03-29 DIAGNOSIS — H903 Sensorineural hearing loss, bilateral: Secondary | ICD-10-CM | POA: Diagnosis not present

## 2023-03-29 DIAGNOSIS — R2689 Other abnormalities of gait and mobility: Secondary | ICD-10-CM | POA: Diagnosis not present

## 2023-03-29 DIAGNOSIS — I5022 Chronic systolic (congestive) heart failure: Secondary | ICD-10-CM | POA: Diagnosis not present

## 2023-03-29 DIAGNOSIS — R54 Age-related physical debility: Secondary | ICD-10-CM | POA: Diagnosis not present

## 2023-03-29 DIAGNOSIS — J9601 Acute respiratory failure with hypoxia: Secondary | ICD-10-CM | POA: Diagnosis not present

## 2023-03-29 DIAGNOSIS — R1312 Dysphagia, oropharyngeal phase: Secondary | ICD-10-CM | POA: Diagnosis not present

## 2023-03-29 DIAGNOSIS — M6281 Muscle weakness (generalized): Secondary | ICD-10-CM | POA: Diagnosis not present

## 2023-03-29 DIAGNOSIS — R2681 Unsteadiness on feet: Secondary | ICD-10-CM | POA: Diagnosis not present

## 2023-03-30 DIAGNOSIS — H903 Sensorineural hearing loss, bilateral: Secondary | ICD-10-CM | POA: Diagnosis not present

## 2023-03-30 DIAGNOSIS — I5022 Chronic systolic (congestive) heart failure: Secondary | ICD-10-CM | POA: Diagnosis not present

## 2023-03-30 DIAGNOSIS — R1312 Dysphagia, oropharyngeal phase: Secondary | ICD-10-CM | POA: Diagnosis not present

## 2023-03-30 DIAGNOSIS — M6281 Muscle weakness (generalized): Secondary | ICD-10-CM | POA: Diagnosis not present

## 2023-03-30 DIAGNOSIS — R2681 Unsteadiness on feet: Secondary | ICD-10-CM | POA: Diagnosis not present

## 2023-03-30 DIAGNOSIS — R2689 Other abnormalities of gait and mobility: Secondary | ICD-10-CM | POA: Diagnosis not present

## 2023-03-30 DIAGNOSIS — R54 Age-related physical debility: Secondary | ICD-10-CM | POA: Diagnosis not present

## 2023-03-30 DIAGNOSIS — Z931 Gastrostomy status: Secondary | ICD-10-CM | POA: Diagnosis not present

## 2023-03-30 DIAGNOSIS — J9601 Acute respiratory failure with hypoxia: Secondary | ICD-10-CM | POA: Diagnosis not present

## 2023-03-31 DIAGNOSIS — Z931 Gastrostomy status: Secondary | ICD-10-CM | POA: Diagnosis not present

## 2023-03-31 DIAGNOSIS — I5022 Chronic systolic (congestive) heart failure: Secondary | ICD-10-CM | POA: Diagnosis not present

## 2023-03-31 DIAGNOSIS — R1312 Dysphagia, oropharyngeal phase: Secondary | ICD-10-CM | POA: Diagnosis not present

## 2023-03-31 DIAGNOSIS — R2681 Unsteadiness on feet: Secondary | ICD-10-CM | POA: Diagnosis not present

## 2023-03-31 DIAGNOSIS — R54 Age-related physical debility: Secondary | ICD-10-CM | POA: Diagnosis not present

## 2023-03-31 DIAGNOSIS — H903 Sensorineural hearing loss, bilateral: Secondary | ICD-10-CM | POA: Diagnosis not present

## 2023-03-31 DIAGNOSIS — J9601 Acute respiratory failure with hypoxia: Secondary | ICD-10-CM | POA: Diagnosis not present

## 2023-03-31 DIAGNOSIS — M6281 Muscle weakness (generalized): Secondary | ICD-10-CM | POA: Diagnosis not present

## 2023-03-31 DIAGNOSIS — R2689 Other abnormalities of gait and mobility: Secondary | ICD-10-CM | POA: Diagnosis not present

## 2023-04-01 DIAGNOSIS — Z931 Gastrostomy status: Secondary | ICD-10-CM | POA: Diagnosis not present

## 2023-04-01 DIAGNOSIS — J9601 Acute respiratory failure with hypoxia: Secondary | ICD-10-CM | POA: Diagnosis not present

## 2023-04-01 DIAGNOSIS — M6281 Muscle weakness (generalized): Secondary | ICD-10-CM | POA: Diagnosis not present

## 2023-04-01 DIAGNOSIS — R2681 Unsteadiness on feet: Secondary | ICD-10-CM | POA: Diagnosis not present

## 2023-04-01 DIAGNOSIS — R54 Age-related physical debility: Secondary | ICD-10-CM | POA: Diagnosis not present

## 2023-04-01 DIAGNOSIS — R2689 Other abnormalities of gait and mobility: Secondary | ICD-10-CM | POA: Diagnosis not present

## 2023-04-01 DIAGNOSIS — I5022 Chronic systolic (congestive) heart failure: Secondary | ICD-10-CM | POA: Diagnosis not present

## 2023-04-01 DIAGNOSIS — R1312 Dysphagia, oropharyngeal phase: Secondary | ICD-10-CM | POA: Diagnosis not present

## 2023-04-01 DIAGNOSIS — H903 Sensorineural hearing loss, bilateral: Secondary | ICD-10-CM | POA: Diagnosis not present

## 2023-04-02 DIAGNOSIS — J9601 Acute respiratory failure with hypoxia: Secondary | ICD-10-CM | POA: Diagnosis not present

## 2023-04-02 DIAGNOSIS — R1312 Dysphagia, oropharyngeal phase: Secondary | ICD-10-CM | POA: Diagnosis not present

## 2023-04-02 DIAGNOSIS — Z931 Gastrostomy status: Secondary | ICD-10-CM | POA: Diagnosis not present

## 2023-04-02 DIAGNOSIS — R54 Age-related physical debility: Secondary | ICD-10-CM | POA: Diagnosis not present

## 2023-04-02 DIAGNOSIS — M6281 Muscle weakness (generalized): Secondary | ICD-10-CM | POA: Diagnosis not present

## 2023-04-02 DIAGNOSIS — I5022 Chronic systolic (congestive) heart failure: Secondary | ICD-10-CM | POA: Diagnosis not present

## 2023-04-02 DIAGNOSIS — R2681 Unsteadiness on feet: Secondary | ICD-10-CM | POA: Diagnosis not present

## 2023-04-02 DIAGNOSIS — H903 Sensorineural hearing loss, bilateral: Secondary | ICD-10-CM | POA: Diagnosis not present

## 2023-04-02 DIAGNOSIS — R2689 Other abnormalities of gait and mobility: Secondary | ICD-10-CM | POA: Diagnosis not present

## 2023-04-04 DIAGNOSIS — R54 Age-related physical debility: Secondary | ICD-10-CM | POA: Diagnosis not present

## 2023-04-04 DIAGNOSIS — R2681 Unsteadiness on feet: Secondary | ICD-10-CM | POA: Diagnosis not present

## 2023-04-04 DIAGNOSIS — J9601 Acute respiratory failure with hypoxia: Secondary | ICD-10-CM | POA: Diagnosis not present

## 2023-04-04 DIAGNOSIS — Z931 Gastrostomy status: Secondary | ICD-10-CM | POA: Diagnosis not present

## 2023-04-04 DIAGNOSIS — I5022 Chronic systolic (congestive) heart failure: Secondary | ICD-10-CM | POA: Diagnosis not present

## 2023-04-04 DIAGNOSIS — R1312 Dysphagia, oropharyngeal phase: Secondary | ICD-10-CM | POA: Diagnosis not present

## 2023-04-04 DIAGNOSIS — M6281 Muscle weakness (generalized): Secondary | ICD-10-CM | POA: Diagnosis not present

## 2023-04-04 DIAGNOSIS — R2689 Other abnormalities of gait and mobility: Secondary | ICD-10-CM | POA: Diagnosis not present

## 2023-04-04 DIAGNOSIS — H903 Sensorineural hearing loss, bilateral: Secondary | ICD-10-CM | POA: Diagnosis not present

## 2023-04-05 DIAGNOSIS — R1312 Dysphagia, oropharyngeal phase: Secondary | ICD-10-CM | POA: Diagnosis not present

## 2023-04-05 DIAGNOSIS — R2681 Unsteadiness on feet: Secondary | ICD-10-CM | POA: Diagnosis not present

## 2023-04-05 DIAGNOSIS — R2689 Other abnormalities of gait and mobility: Secondary | ICD-10-CM | POA: Diagnosis not present

## 2023-04-05 DIAGNOSIS — J9601 Acute respiratory failure with hypoxia: Secondary | ICD-10-CM | POA: Diagnosis not present

## 2023-04-05 DIAGNOSIS — R54 Age-related physical debility: Secondary | ICD-10-CM | POA: Diagnosis not present

## 2023-04-05 DIAGNOSIS — H903 Sensorineural hearing loss, bilateral: Secondary | ICD-10-CM | POA: Diagnosis not present

## 2023-04-05 DIAGNOSIS — I5022 Chronic systolic (congestive) heart failure: Secondary | ICD-10-CM | POA: Diagnosis not present

## 2023-04-05 DIAGNOSIS — M6281 Muscle weakness (generalized): Secondary | ICD-10-CM | POA: Diagnosis not present

## 2023-04-05 DIAGNOSIS — Z931 Gastrostomy status: Secondary | ICD-10-CM | POA: Diagnosis not present

## 2023-04-06 DIAGNOSIS — R54 Age-related physical debility: Secondary | ICD-10-CM | POA: Diagnosis not present

## 2023-04-06 DIAGNOSIS — J9601 Acute respiratory failure with hypoxia: Secondary | ICD-10-CM | POA: Diagnosis not present

## 2023-04-06 DIAGNOSIS — M6281 Muscle weakness (generalized): Secondary | ICD-10-CM | POA: Diagnosis not present

## 2023-04-06 DIAGNOSIS — Z931 Gastrostomy status: Secondary | ICD-10-CM | POA: Diagnosis not present

## 2023-04-06 DIAGNOSIS — R2689 Other abnormalities of gait and mobility: Secondary | ICD-10-CM | POA: Diagnosis not present

## 2023-04-06 DIAGNOSIS — R1312 Dysphagia, oropharyngeal phase: Secondary | ICD-10-CM | POA: Diagnosis not present

## 2023-04-06 DIAGNOSIS — R2681 Unsteadiness on feet: Secondary | ICD-10-CM | POA: Diagnosis not present

## 2023-04-06 DIAGNOSIS — H903 Sensorineural hearing loss, bilateral: Secondary | ICD-10-CM | POA: Diagnosis not present

## 2023-04-06 DIAGNOSIS — I5022 Chronic systolic (congestive) heart failure: Secondary | ICD-10-CM | POA: Diagnosis not present

## 2023-04-08 DIAGNOSIS — H903 Sensorineural hearing loss, bilateral: Secondary | ICD-10-CM | POA: Diagnosis not present

## 2023-04-08 DIAGNOSIS — I5022 Chronic systolic (congestive) heart failure: Secondary | ICD-10-CM | POA: Diagnosis not present

## 2023-04-08 DIAGNOSIS — R1312 Dysphagia, oropharyngeal phase: Secondary | ICD-10-CM | POA: Diagnosis not present

## 2023-04-08 DIAGNOSIS — R54 Age-related physical debility: Secondary | ICD-10-CM | POA: Diagnosis not present

## 2023-04-08 DIAGNOSIS — R2689 Other abnormalities of gait and mobility: Secondary | ICD-10-CM | POA: Diagnosis not present

## 2023-04-08 DIAGNOSIS — Z931 Gastrostomy status: Secondary | ICD-10-CM | POA: Diagnosis not present

## 2023-04-08 DIAGNOSIS — M6281 Muscle weakness (generalized): Secondary | ICD-10-CM | POA: Diagnosis not present

## 2023-04-08 DIAGNOSIS — J9601 Acute respiratory failure with hypoxia: Secondary | ICD-10-CM | POA: Diagnosis not present

## 2023-04-08 DIAGNOSIS — R2681 Unsteadiness on feet: Secondary | ICD-10-CM | POA: Diagnosis not present

## 2023-04-09 DIAGNOSIS — J9601 Acute respiratory failure with hypoxia: Secondary | ICD-10-CM | POA: Diagnosis not present

## 2023-04-09 DIAGNOSIS — Z931 Gastrostomy status: Secondary | ICD-10-CM | POA: Diagnosis not present

## 2023-04-09 DIAGNOSIS — R2689 Other abnormalities of gait and mobility: Secondary | ICD-10-CM | POA: Diagnosis not present

## 2023-04-09 DIAGNOSIS — M6281 Muscle weakness (generalized): Secondary | ICD-10-CM | POA: Diagnosis not present

## 2023-04-09 DIAGNOSIS — R1312 Dysphagia, oropharyngeal phase: Secondary | ICD-10-CM | POA: Diagnosis not present

## 2023-04-09 DIAGNOSIS — R54 Age-related physical debility: Secondary | ICD-10-CM | POA: Diagnosis not present

## 2023-04-09 DIAGNOSIS — R2681 Unsteadiness on feet: Secondary | ICD-10-CM | POA: Diagnosis not present

## 2023-04-09 DIAGNOSIS — I5022 Chronic systolic (congestive) heart failure: Secondary | ICD-10-CM | POA: Diagnosis not present

## 2023-04-09 DIAGNOSIS — H903 Sensorineural hearing loss, bilateral: Secondary | ICD-10-CM | POA: Diagnosis not present

## 2023-04-10 DIAGNOSIS — J9601 Acute respiratory failure with hypoxia: Secondary | ICD-10-CM | POA: Diagnosis not present

## 2023-04-10 DIAGNOSIS — R2681 Unsteadiness on feet: Secondary | ICD-10-CM | POA: Diagnosis not present

## 2023-04-10 DIAGNOSIS — I5022 Chronic systolic (congestive) heart failure: Secondary | ICD-10-CM | POA: Diagnosis not present

## 2023-04-10 DIAGNOSIS — R1312 Dysphagia, oropharyngeal phase: Secondary | ICD-10-CM | POA: Diagnosis not present

## 2023-04-10 DIAGNOSIS — M6281 Muscle weakness (generalized): Secondary | ICD-10-CM | POA: Diagnosis not present

## 2023-04-10 DIAGNOSIS — R2689 Other abnormalities of gait and mobility: Secondary | ICD-10-CM | POA: Diagnosis not present

## 2023-04-10 DIAGNOSIS — R54 Age-related physical debility: Secondary | ICD-10-CM | POA: Diagnosis not present

## 2023-04-10 DIAGNOSIS — Z931 Gastrostomy status: Secondary | ICD-10-CM | POA: Diagnosis not present

## 2023-04-10 DIAGNOSIS — H903 Sensorineural hearing loss, bilateral: Secondary | ICD-10-CM | POA: Diagnosis not present

## 2023-04-11 DIAGNOSIS — M6281 Muscle weakness (generalized): Secondary | ICD-10-CM | POA: Diagnosis not present

## 2023-04-11 DIAGNOSIS — I5022 Chronic systolic (congestive) heart failure: Secondary | ICD-10-CM | POA: Diagnosis not present

## 2023-04-11 DIAGNOSIS — J9601 Acute respiratory failure with hypoxia: Secondary | ICD-10-CM | POA: Diagnosis not present

## 2023-04-11 DIAGNOSIS — R2681 Unsteadiness on feet: Secondary | ICD-10-CM | POA: Diagnosis not present

## 2023-04-11 DIAGNOSIS — R54 Age-related physical debility: Secondary | ICD-10-CM | POA: Diagnosis not present

## 2023-04-11 DIAGNOSIS — Z931 Gastrostomy status: Secondary | ICD-10-CM | POA: Diagnosis not present

## 2023-04-11 DIAGNOSIS — H903 Sensorineural hearing loss, bilateral: Secondary | ICD-10-CM | POA: Diagnosis not present

## 2023-04-11 DIAGNOSIS — R1312 Dysphagia, oropharyngeal phase: Secondary | ICD-10-CM | POA: Diagnosis not present

## 2023-04-11 DIAGNOSIS — R2689 Other abnormalities of gait and mobility: Secondary | ICD-10-CM | POA: Diagnosis not present

## 2023-04-12 DIAGNOSIS — R2681 Unsteadiness on feet: Secondary | ICD-10-CM | POA: Diagnosis not present

## 2023-04-12 DIAGNOSIS — J9601 Acute respiratory failure with hypoxia: Secondary | ICD-10-CM | POA: Diagnosis not present

## 2023-04-12 DIAGNOSIS — Z931 Gastrostomy status: Secondary | ICD-10-CM | POA: Diagnosis not present

## 2023-04-12 DIAGNOSIS — I5022 Chronic systolic (congestive) heart failure: Secondary | ICD-10-CM | POA: Diagnosis not present

## 2023-04-12 DIAGNOSIS — R54 Age-related physical debility: Secondary | ICD-10-CM | POA: Diagnosis not present

## 2023-04-12 DIAGNOSIS — M6281 Muscle weakness (generalized): Secondary | ICD-10-CM | POA: Diagnosis not present

## 2023-04-12 DIAGNOSIS — H903 Sensorineural hearing loss, bilateral: Secondary | ICD-10-CM | POA: Diagnosis not present

## 2023-04-12 DIAGNOSIS — R2689 Other abnormalities of gait and mobility: Secondary | ICD-10-CM | POA: Diagnosis not present

## 2023-04-12 DIAGNOSIS — R1312 Dysphagia, oropharyngeal phase: Secondary | ICD-10-CM | POA: Diagnosis not present

## 2023-04-13 ENCOUNTER — Other Ambulatory Visit (HOSPITAL_COMMUNITY): Payer: Self-pay | Admitting: Internal Medicine

## 2023-04-13 ENCOUNTER — Telehealth: Payer: Self-pay | Admitting: Cardiology

## 2023-04-13 DIAGNOSIS — R2681 Unsteadiness on feet: Secondary | ICD-10-CM | POA: Diagnosis not present

## 2023-04-13 DIAGNOSIS — Z931 Gastrostomy status: Secondary | ICD-10-CM | POA: Diagnosis not present

## 2023-04-13 DIAGNOSIS — Z431 Encounter for attention to gastrostomy: Secondary | ICD-10-CM

## 2023-04-13 DIAGNOSIS — M6281 Muscle weakness (generalized): Secondary | ICD-10-CM | POA: Diagnosis not present

## 2023-04-13 DIAGNOSIS — I5022 Chronic systolic (congestive) heart failure: Secondary | ICD-10-CM | POA: Diagnosis not present

## 2023-04-13 DIAGNOSIS — R1312 Dysphagia, oropharyngeal phase: Secondary | ICD-10-CM | POA: Diagnosis not present

## 2023-04-13 DIAGNOSIS — H903 Sensorineural hearing loss, bilateral: Secondary | ICD-10-CM | POA: Diagnosis not present

## 2023-04-13 DIAGNOSIS — R2689 Other abnormalities of gait and mobility: Secondary | ICD-10-CM | POA: Diagnosis not present

## 2023-04-13 DIAGNOSIS — R54 Age-related physical debility: Secondary | ICD-10-CM | POA: Diagnosis not present

## 2023-04-13 DIAGNOSIS — J9601 Acute respiratory failure with hypoxia: Secondary | ICD-10-CM | POA: Diagnosis not present

## 2023-04-13 NOTE — Telephone Encounter (Signed)
Spoke with son Onalee Hua per [employer]. He stated that pt had been in the hospital for a month and is now in the nursing home for rehab. He is wanting appt for follow up. Sent to front desk for appt.

## 2023-04-13 NOTE — Telephone Encounter (Signed)
Pt's son called to speak with a nurse regarding what he should do next since his dad was in the hospital for a month back in March, now he's in a nursing home in [place] doing well but he has a bad heart. He'd like a callback to be advised on what to do from here. Please advise

## 2023-04-21 ENCOUNTER — Ambulatory Visit (HOSPITAL_COMMUNITY)
Admission: RE | Admit: 2023-04-21 | Discharge: 2023-04-21 | Disposition: A | Discharge status: Home / Self Care | Payer: Medicare Other | Source: Ambulatory Visit | Attending: Interventional Radiology | Admitting: Interventional Radiology

## 2023-04-21 DIAGNOSIS — Z431 Encounter for attention to gastrostomy: Secondary | ICD-10-CM | POA: Diagnosis not present

## 2023-04-21 HISTORY — PX: IR GASTROSTOMY TUBE REMOVAL: IMG5492

## 2023-04-21 NOTE — Procedures (Signed)
  Balloon retention gtube easily removed today without difficulty.   Ruthell Feigenbaum S Hoorain Kozakiewicz PA-C 04/21/2023 4:07 PM

## 2023-04-22 ENCOUNTER — Encounter: Payer: Self-pay | Admitting: Cardiology

## 2023-04-22 ENCOUNTER — Ambulatory Visit: Payer: Medicare Other | Attending: Cardiology | Admitting: Cardiology

## 2023-04-22 VITALS — BP 96/52 | HR 75 | Ht 71.0 in | Wt 139.6 lb

## 2023-04-22 DIAGNOSIS — M79642 Pain in left hand: Secondary | ICD-10-CM | POA: Diagnosis not present

## 2023-04-22 DIAGNOSIS — M109 Gout, unspecified: Secondary | ICD-10-CM

## 2023-04-22 DIAGNOSIS — I10 Essential (primary) hypertension: Secondary | ICD-10-CM | POA: Diagnosis not present

## 2023-04-22 DIAGNOSIS — I493 Ventricular premature depolarization: Secondary | ICD-10-CM

## 2023-04-22 DIAGNOSIS — I5022 Chronic systolic (congestive) heart failure: Secondary | ICD-10-CM

## 2023-04-22 DIAGNOSIS — M79672 Pain in left foot: Secondary | ICD-10-CM | POA: Diagnosis not present

## 2023-04-22 DIAGNOSIS — I480 Paroxysmal atrial fibrillation: Secondary | ICD-10-CM | POA: Diagnosis not present

## 2023-04-22 NOTE — Progress Notes (Signed)
Cardiology Office Note:    Date:  04/22/2023   ID:  Curtis Ramos, DOB [birth year], MRN [id number]  PCP:  Curtis Ravel, MD   [facility] Providers Cardiologist:  None Electrophysiologist:  Curtis Prude, MD     Referring MD: Curtis Ravel, MD  CC: hospital follow up   History of Present Illness:    Curtis Ramos is a 89 y.o. male with a hx of congestive heart failure, NICM, atrial fibrillation on Eliquis, hypertension, hypothyroidism, history of thyroid cancer.  Echo 03/2019 EF 20 to 25% Echo 09/2019 EF 35 to 40% Echo 10/2020 EF 55 to 60% Echo 09/2022 EF 20 to 25% Echo 01/2023 EF 20% Cardiac MRI February 2024 EF 17%; severe left atrial enlargement, no LGE  Most recently was evaluated by Dr. Bing Ramos on 12/15/2022, at this time he was doing stable from a cardiac perspective.  He was in atrial fibrillation, did not want to pursue cardioversion.  Plans were to follow-up with advanced heart failure and possibly consider biventricular pacing.  He had been placed on amiodarone for possible DCCV, but advanced heart failure team felt given advanced duration of A-fib and massive LA dilation that this would likely not hold.  Presented to the ED on 01/13/2023 after an unwitnessed fall at home, was unresponsive with a contusion.  Upon arrival to the ED patient became apneic and pulseless, monitor revealed PEA.  CPR was administered for 9 minutes, epi x 4 and ROSC achieved.  He was intubated and admitted to ICU.  Prolonged hospitalization requiring reintubation/BiPAP.  After 29 days he was discharged to [facility] for rehab.  He presents today accompanied by son for follow-up after hospitalization as outlined above.  He has been residing at  [facility].  He is increasing with this physical activity, walks a lot throughout the day however is requiring a walker for ambulation, states he just does not have the strength in his legs.  Regarding his heart failure, he does not  offer any complaints.  He does complain of his finger on his left hand, apparently imaging was obtained today at the facility.  His finger is swollen and exquisitely tender, I do not see any lacerations or abrasions. He denies chest pain, palpitations, dyspnea, pnd, orthopnea, n, v, dizziness, syncope, edema, weight gain, or early satiety.   Past Medical History:  Diagnosis Date   Acute on chronic combined systolic and diastolic CHF (congestive heart failure) (HCC) 03/19/2019   AKI (acute kidney injury) (HCC) 12/26/2014   Arthralgia 11/04/2014   Arthritis    "proabably"   Atypical mycobacterium infection    Cancer [facility])    family unaware of this hx on 12/26/2014   Chronic systolic heart failure (HCC)    Decreased oral intake 12/26/2014   Decreased oral intake 12/26/2014   Depressed left ventricular ejection fraction 02/05/2020   Dilated cardiomyopathy (HCC) 03/19/2019   Ejection fraction 20 to 25% based on echocardiogram done by pulmonologist month ago.   DJD (degenerative joint disease) 10/07/2014   Dyspnea on exertion 03/19/2019   Essential hypertension 02/05/2020   GERD (gastroesophageal reflux disease)    Hammer toes of both feet 01/30/2018   History of thyroid cancer 12/16/2016   Hypercalcemia 12/26/2014   Hypothyroidism    Hypothyroidism associated with surgical procedure 10/07/2014   Lung mass    Myalgia 11/04/2014   Mycobacterium avium complex (HCC)    Nail dystrophy 01/30/2018   Occult malignancy (HCC)    PAF (paroxysmal  atrial fibrillation) (HCC) 02/05/2020   Persistent atrial fibrillation (HCC) 03/19/2019   Chads 2 Vascor equals 3   PONV (postoperative nausea and vomiting)    Pre-ulcerative calluses 01/30/2018   Pseudoaneurysm following procedure (HCC) 04/16/2020   Sensorineural hearing loss (SNHL), bilateral 08/11/2018   Shingles    Toenail fungus 01/30/2018   Wears glasses     Past Surgical History:  Procedure Laterality Date   APPENDECTOMY     CATARACT EXTRACTION W/ INTRAOCULAR  LENS  IMPLANT, BILATERAL Bilateral    COLONOSCOPY     INGUINAL HERNIA REPAIR Left    IR GASTROSTOMY TUBE MOD SED  02/09/2023   IR GASTROSTOMY TUBE REMOVAL  04/21/2023   LEFT HEART CATH AND CORONARY ANGIOGRAPHY N/A 03/11/2020   Procedure: LEFT HEART CATH AND CORONARY ANGIOGRAPHY;  Surgeon: Corky Crafts, MD;  Location: [facility];  Service: Cardiovascular;  Laterality: N/A;   MASS EXCISION Right 08/27/2014   Procedure: EXCISION MASS RIGHT PALM/INDEX;  Surgeon: Betha Loa, MD;  Location: [facility];  Service: Orthopedics;  Laterality: Right;   THYROIDECTOMY  2003    Current Medications: Current Meds  Medication Sig   acetaminophen (TYLENOL) 500 MG tablet Take 2 tablets (1,000 mg total) by mouth every 6 (six) hours as needed for mild pain, headache or fever.   amiodarone (PACERONE) 200 MG tablet Take 1 tablet (200 mg total) by mouth 2 (two) times daily. Needs appointment for future refills / 1st attempt   DOCUSATE SODIUM PO Take 1 tablet by mouth daily.   feeding supplement (ENSURE ENLIVE / ENSURE PLUS) LIQD Take 237 mLs by mouth 2 (two) times daily between meals.   lidocaine (LIDODERM) 5 % Place 1 patch onto the skin daily. Remove & Discard patch within 12 hours or as directed by MD   losartan (COZAAR) 25 MG tablet Take 0.5 tablets (12.5 mg total) by mouth daily.   mirtazapine (REMERON) 7.5 MG tablet Take 1 tablet (7.5 mg total) by mouth at bedtime.   Multiple Vitamins-Minerals (PRESERVISION AREDS PO) Take 1 tablet by mouth daily.   Nutritional Supplements (FEEDING SUPPLEMENT, OSMOLITE 1.5 CAL,) LIQD Place 237 mLs into feeding tube 6 (six) times daily.   pantoprazole (PROTONIX) 40 MG tablet Take 1 tablet (40 mg total) by mouth 2 (two) times daily.   polyethylene glycol (MIRALAX / GLYCOLAX) 17 g packet Take 17 g by mouth daily.   spironolactone (ALDACTONE) 25 MG tablet Take 0.5 tablets (12.5 mg total) by mouth daily.   SYNTHROID 175 MCG tablet Take 175 mcg by mouth  daily.   Water For Irrigation, Sterile (FREE WATER) SOLN Place 100 mLs into feeding tube 6 (six) times daily.     Allergies:   Patient has no known allergies.   Social History   Socioeconomic History   Marital status: Married    Spouse name: Not on file   Number of children: Not on file   Years of education: Not on file   Highest education level: Not on file  Occupational History   Not on file  Tobacco Use   Smoking status: Never   Smokeless tobacco: Never  Substance and Sexual Activity   Alcohol use: No   Drug use: No   Sexual activity: Not on file  Other Topics Concern   Not on file  Social History Narrative   Not on file   Social Determinants of Health   Financial Resource Strain: Low Risk  (01/18/2023)   Overall Financial Resource Strain (CARDIA)  Difficulty of Paying Living Expenses: Not hard at all  Food Insecurity: No Food Insecurity (01/18/2023)   Hunger Vital Sign    Worried About Running Out of Food in the Last Year: Never true    Ran Out of Food in the Last Year: Never true  Transportation Needs: No Transportation Needs (01/18/2023)   PRAPARE - Administrator, Civil Service (Medical): No    Lack of Transportation (Non-Medical): No  Physical Activity: Not on file  Stress: Not on file  Social Connections: Not on file     Family History: The patient's family history is not on file.  ROS:   Please see the history of present illness.     All other systems reviewed and are negative.  EKGs/Labs/Other Studies Reviewed:    The following studies were reviewed today: Cardiac Studies & Procedures   CARDIAC CATHETERIZATION  CARDIAC CATHETERIZATION 03/11/2020  Narrative  LV end diastolic pressure is normal.  There is no aortic valve stenosis.  Minimal, nonobstructive CAD.  Continue medical therapy for LV dysfunction, nonischemic cardiomyopathy.  Findings Coronary Findings Diagnostic  Dominance: Left  Left Anterior Descending The vessel  exhibits minimal luminal irregularities.  Left Circumflex The vessel exhibits minimal luminal irregularities.  Intervention  No interventions have been documented.     ECHOCARDIOGRAM  ECHOCARDIOGRAM COMPLETE 01/14/2023  Narrative ECHOCARDIOGRAM REPORT    Patient Name:   ZARIAN COLPITTS [facility] Date of Exam: 01/14/2023 Medical Rec #:  [id number]     Height:       71.0 in Accession #:    [id number]    Weight:       137.6 lb Date of Birth:  18-Nov-1931     BSA:          1.799 m Patient Age:    89 years      BP:           102/59 mmHg Patient Gender: M             HR:           65 bpm. Exam Location:  Inpatient  Procedure: 2D Echo, Cardiac Doppler, Color Doppler and Intracardiac Opacification Agent  MODIFIED REPORT: This report was modified by Riley Lam MD on 01/14/2023 due to Spelling. Indications:     Cardiac arrest I46.9  History:         Patient has no prior history of Echocardiogram examinations, most recent 09/22/2022. Cardiomyopathy and CHF, Arrythmias:Atrial Fibrillation; Risk Factors:Hypertension.  Sonographer:     Lucendia Herrlich Referring Phys:  [id number] Elenore Paddy REESE Diagnosing Phys: Riley Lam MD  IMPRESSIONS   1. Left ventricular ejection fraction, by estimation, is 20%. The left ventricle has severely decreased function. The left ventricle demonstrates regional wall motion abnormalities (abnormal septal motion). The left ventricular internal cavity size was mildly dilated. No LV thrombus. 2. The mitral valve is normal in structure. Mild to moderate mitral valve regurgitation. No evidence of mitral stenosis. 3. The aortic valve is tricuspid. There is mild calcification of the aortic valve. There is mild thickening of the aortic valve. Aortic valve regurgitation is mild. Aortic valve sclerosis is present, with no evidence of aortic valve stenosis. 4. Right ventricular systolic function is normal. The right ventricular size is moderately enlarged. 5. The  inferior vena cava is normal in size with greater than 50% respiratory variability, suggesting right atrial pressure of 3 mmHg. 6. Right atrial size was mildly dilated.  FINDINGS Left Ventricle: Left ventricular ejection fraction, by estimation,  is 20%. The left ventricle has severely decreased function. The left ventricle demonstrates regional wall motion abnormalities. Definity contrast agent was given IV to delineate the left ventricular endocardial borders. The left ventricular internal cavity size was mildly dilated. There is no left ventricular hypertrophy. Abnormal (paradoxical) septal motion, consistent with left bundle branch block. Left ventricular diastolic parameters are indeterminate.  Right Ventricle: The right ventricular size is moderately enlarged. No increase in right ventricular wall thickness. Right ventricular systolic function is normal.  Left Atrium: Left atrial size was normal in size.  Right Atrium: Right atrial size was mildly dilated.  Pericardium: There is no evidence of pericardial effusion.  Mitral Valve: The mitral valve is normal in structure. Mild to moderate mitral valve regurgitation. No evidence of mitral valve stenosis.  Tricuspid Valve: The tricuspid valve is not well visualized. Tricuspid valve regurgitation is mild . No evidence of tricuspid stenosis.  Aortic Valve: The aortic valve is tricuspid. There is mild calcification of the aortic valve. There is mild thickening of the aortic valve. There is mild aortic valve annular calcification. Aortic valve regurgitation is mild. Aortic regurgitation PHT measures 844 msec. Aortic valve sclerosis is present, with no evidence of aortic valve stenosis. Aortic valve mean gradient measures 3.5 mmHg. Aortic valve peak gradient measures 7.3 mmHg. Aortic valve area, by VTI measures 2.57 cm.  Pulmonic Valve: The pulmonic valve was not well visualized. Pulmonic valve regurgitation is not visualized.  Aorta: The  aortic root and ascending aorta are structurally normal, with no evidence of dilitation.  Venous: The inferior vena cava is normal in size with greater than 50% respiratory variability, suggesting right atrial pressure of 3 mmHg.  IAS/Shunts: No atrial level shunt detected by color flow Doppler.   LEFT VENTRICLE PLAX 2D LVIDd:         5.60 cm      Diastology LVIDs:         4.90 cm      LV e' medial:    4.37 cm/s LV PW:         1.10 cm      LV E/e' medial:  12.7 LV IVS:        1.00 cm      LV e' lateral:   5.68 cm/s LVOT diam:     2.30 cm      LV E/e' lateral: 9.8 LV SV:         57 LV SV Index:   31 LVOT Area:     4.15 cm  LV Volumes (MOD) LV vol d, MOD A2C: 157.0 ml LV vol d, MOD A4C: 196.0 ml LV vol s, MOD A2C: 123.0 ml LV vol s, MOD A4C: 143.0 ml LV SV MOD A2C:     34.0 ml LV SV MOD A4C:     196.0 ml LV SV MOD BP:      36.1 ml  RIGHT VENTRICLE             IVC RV S prime:     11.90 cm/s  IVC diam: 1.90 cm TAPSE (M-mode): 1.8 cm  LEFT ATRIUM             Index        RIGHT ATRIUM           Index LA diam:        3.70 cm 2.06 cm/m   RA Area:     21.80 cm LA Vol (A2C):   38.2 ml 21.24 ml/m  RA Volume:  65.00 ml  36.14 ml/m LA Vol (A4C):   65.2 ml 36.25 ml/m LA Biplane Vol: 49.9 ml 27.74 ml/m AORTIC VALVE AV Area (Vmax):    2.71 cm AV Area (Vmean):   2.50 cm AV Area (VTI):     2.57 cm AV Vmax:           135.50 cm/s AV Vmean:          86.200 cm/s AV VTI:            0.220 m AV Peak Grad:      7.3 mmHg AV Mean Grad:      3.5 mmHg LVOT Vmax:         88.47 cm/s LVOT Vmean:        51.767 cm/s LVOT VTI:          0.136 m LVOT/AV VTI ratio: 0.62 AI PHT:            844 msec  AORTA Ao Root diam: 3.60 cm Ao Asc diam:  3.30 cm  MITRAL VALVE               TRICUSPID VALVE MV Area (PHT): 2.97 cm    TR Peak grad:   25.8 mmHg MV Decel Time: 256 msec    TR Vmax:        254.00 cm/s MV E velocity: 55.45 cm/s MV A velocity: 42.20 cm/s  SHUNTS MV E/A ratio:  1.31         Systemic VTI:  0.14 m Systemic Diam: 2.30 cm  Riley Lam MD Electronically signed by Riley Lam MD Signature Date/Time: 01/14/2023/5:14:08 PM    Final (Updated)    MONITORS  LONG TERM MONITOR (3-14 DAYS) 01/02/2023  Narrative Patch Wear Time:  2 days and 23 hours (2024-01-23T13:03:05-499 to 2024-01-26T12:25:36-0500)  1. Atrial Flutter occurred continuously (100% burden), ranging from 38-115 bpm (avg of 67 bpm). 2. One run of nonsustained Ventricular Tachycardia occurred lasting 4 beats with a max rate of 162 bpm (avg 140 bpm). 3. Bundle Branch Block/IVCD was present. 4. Occasional PVCs (1.8%, 5129)  Arvilla Meres, MD 9:50 PM    CARDIAC MRI  MR CARDIAC MORPHOLOGY W WO CONTRAST 12/22/2022  Narrative CLINICAL DATA:  Nonischemic cardiomyopathy.  EXAM: CARDIAC MRI  TECHNIQUE: The patient was scanned on a 1.5 Tesla GE magnet. A dedicated cardiac coil was used. Functional imaging was done using Fiesta sequences. 2,3, and 4 chamber views were done to assess for RWMA's. Modified Simpson's rule using a short axis stack was used to calculate an ejection fraction on a dedicated work [employer]. The patient received 8 cc of Gadavist. After 10 minutes inversion recovery sequences were used to assess for infiltration and scar tissue.  FINDINGS: Limited images of the lung fields reviewed. Peripheral nodules in the right and left mid lung, around 1 cm in diameter noted.  Severe left ventricular dilation with normal wall thickness. Severe global hypokinesis with septal-lateral dyssynchrony consistent with LBBB, LV EF 17%. Upper normal right ventricular size with RV EF 33%. Severe left atrial enlargement. Moderate right atrial enlargement. Trileaflet aortic valve, mild regurgitation with regurgitant fraction 13%. No significant stenosis. Visually, mitral regurgitation looked at least moderate. Mitral regurgitant fraction calculation is  inaccurate on this study (would be 0).  On delayed enhancement imaging, no myocardial late gadolinium enhancement (LGE) was noted.  MEASUREMENTS: MEASUREMENTS LVEDV 315 mL  LVEDVi 167 mL/m2  LVSV 54 mL  LVEF 17%  RVEDV 179 mL  RVEDVi 95 mL/m2  RVSV 60 mL RVEF 33%  T1 1086, ECV 31%  Aortic forward volume 56 mL  Aortic regurgitant fraction 13%  IMPRESSION: 1. Small peripheral lung nodules, consider CT w/o contrast to fully evaluate.  2. Severe LV dilation with global hypokinesis and septal-lateral dyssynchrony consistent with LBBB. EF 17%.  3.  Upper normal RV size with EF 33%.  4. Visually moderate MR, regurgitant fraction calculated by flow is inaccurate on this study.  5. No myocardial LGE so no definitive evidence for prior MI, infiltrative disease, or myocarditis.  6. Mildly elevated extracellular volume percentage suggestive of mild myocardial fibrosis (nonspecific).  Dalton Mclean   Electronically Signed By: Marca Ancona M.D. On: 12/22/2022 13:36          EKG:  EKG is ordered today.  Sinus rhythm, degree AV block, left BBB, heart rate 75 bpm  Recent Labs: 10/05/2022: NT-Pro BNP 3,434 01/13/2023: B Natriuretic Peptide 1,073.1 02/07/2023: ALT 39; BUN 30; Hemoglobin 10.1; Platelets 281; Potassium 4.3; Sodium 132 02/10/2023: Creatinine, Ser 1.20 02/11/2023: Magnesium 2.1; TSH 23.002  Recent Lipid Panel    Component Value Date/Time   TRIG 96 01/20/2023 0425   LDLDIRECT 80 11/20/2021 1523     Risk Assessment/Calculations:    CHA2DS2-VASc Score = 5   This indicates a 7.2% annual risk of stroke. The patient's score is based upon: CHF History: 1 HTN History: 1 Diabetes History: 0 Stroke History: 0 Vascular Disease History: 1 Age Score: 2 Gender Score: 0               Physical Exam:    VS:  BP (!) 96/52 (BP Location: Left Arm, Patient Position: Sitting)   Pulse 75   Ht 5\' 11"  (1.803 m)   Wt 139 lb 9.6 oz (63.3 kg)   SpO2 96%    BMI 19.47 kg/m     Wt Readings from Last 3 Encounters:  04/22/23 139 lb 9.6 oz (63.3 kg)  02/10/23 128 lb 4.9 oz (58.2 kg)  12/31/22 152 lb (68.9 kg)     GEN: Frail, ill-appearing but in no distress  HEENT: Normal NECK: No JVD; No carotid bruits LYMPHATICS: No lymphadenopathy CARDIAC: RRR, no murmurs, rubs, gallops RESPIRATORY:  Clear to auscultation without rales, wheezing or rhonchi  ABDOMEN: Soft, non-tender, non-distended MUSCULOSKELETAL:  No edema; No deformity  SKIN: Warm and dry NEUROLOGIC:  Alert and oriented x 3 PSYCHIATRIC:  Normal affect   ASSESSMENT:    1. Chronic systolic heart failure (HCC)   2. PVC (premature ventricular contraction)   3. Essential hypertension   4. Gout, unspecified cause, unspecified chronicity, unspecified site   5. Paroxysmal atrial fibrillation (HCC)    PLAN:    In order of problems listed above:  HFrEF-Cardiac MRI February 2024 EF 17%; severe left atrial enlargement, no LGE, echo during hospitalization was 20%.  NYHA class I-II, euvolemic.  Continue Cozaar 12.5 mg daily, continue spironolactone 12.5 mg daily.  Coreg, Clifton Custard, Lasix were all stopped at discharge.  Family has questions about whether he needs to continue and follow-up with the EP.  Will discuss with primary cardiologist Dr. Bing Ramos, if indicated will refer him to Dr. Lalla Brothers.  Will repeat CMET. Paroxysmal atrial fibrillation-today he is in sinus rhythm, CHA2DS2-VASc score of 5.  Eliquis was stopped at discharge after evaluated by GI secondary to hematochezia; as was his carvedilol.  Will repeat CBC. Hypertension-was 96/52, will continue Cozaar 12.5 mg daily. PVCs-quiescent. Gout-exquisitely tender fourth digit on left hand, erythema and edematous, hard for  him to bend.  Son states that imaging was completed at his facility today.  It does not appear to be musculoskeletal related.  Will check uric acid level to rule out gout.  Disposition-CBC, CMET, uric acid level.   Return in 2 months.           Medication Adjustments/Labs and Tests Ordered: Current medicines are reviewed at length with the patient today.  Concerns regarding medicines are outlined above.  Orders Placed This Encounter  Procedures   Comprehensive metabolic panel   CBC with Differential/Platelet   Uric acid   EKG 12-Lead   No orders of the defined types were placed in this encounter.   Patient Instructions  Medication Instructions:  Your physician recommends that you continue on your current medications as directed. Please refer to the Current Medication list given to you today.  *If you need a refill on your cardiac medications before your next appointment, please call your pharmacy*   Lab Work: Your physician recommends that you return for lab work in: Today for CMP, CBC and Uric Acid  If you have labs (blood work) drawn today and your tests are completely normal, you will receive your results only by: MyChart Message (if you have MyChart) OR A paper copy in the mail If you have any lab test that is abnormal or we need to change your treatment, we will call you to review the results.   Testing/Procedures: NONE   Follow-Up: At [facility], you and your health needs are our priority.  As part of our continuing mission to provide you with exceptional heart care, we have created designated Provider Care Teams.  These Care Teams include your primary Cardiologist (physician) and Advanced Practice Providers (APPs -  Physician Assistants and Nurse Practitioners) who all work together to provide you with the care you need, when you need it.  We recommend signing up for the patient portal called "MyChart".  Sign up information is provided on this After Visit Summary.  MyChart is used to connect with patients for Virtual Visits (Telemedicine).  Patients are able to view lab/test results, encounter notes, upcoming appointments, etc.  Non-urgent messages can be sent to  your provider as well.   To learn more about what you can do with MyChart, go to [email or web address].    Your next appointment:   2 month(s)  Provider:   [name], MD    Other Instructions     Signed, Flossie Dibble, NP  04/22/2023 4:48 PM    [facility]

## 2023-04-22 NOTE — Patient Instructions (Signed)
Medication Instructions:  Your physician recommends that you continue on your current medications as directed. Please refer to the Current Medication list given to you today.  *If you need a refill on your cardiac medications before your next appointment, please call your pharmacy*   Lab Work: Your physician recommends that you return for lab work in: Today for CMP, CBC and Uric Acid  If you have labs (blood work) drawn today and your tests are completely normal, you will receive your results only by: MyChart Message (if you have MyChart) OR A paper copy in the mail If you have any lab test that is abnormal or we need to change your treatment, we will call you to review the results.   Testing/Procedures: NONE   Follow-Up: At [facility], you and your health needs are our priority.  As part of our continuing mission to provide you with exceptional heart care, we have created designated Provider Care Teams.  These Care Teams include your primary Cardiologist (physician) and Advanced Practice Providers (APPs -  Physician Assistants and Nurse Practitioners) who all work together to provide you with the care you need, when you need it.  We recommend signing up for the patient portal called "MyChart".  Sign up information is provided on this After Visit Summary.  MyChart is used to connect with patients for Virtual Visits (Telemedicine).  Patients are able to view lab/test results, encounter notes, upcoming appointments, etc.  Non-urgent messages can be sent to your provider as well.   To learn more about what you can do with MyChart, go to [email or web address].    Your next appointment:   2 month(s)  Provider:   [name], MD    Other Instructions

## 2023-04-23 LAB — CBC WITH DIFFERENTIAL/PLATELET
Basophils Absolute: 0 x10E3/uL (ref 0.0–0.2)
Basos: 0 %
EOS (ABSOLUTE): 0.3 x10E3/uL (ref 0.0–0.4)
Eos: 4 %
Hematocrit: 37.1 % — ABNORMAL LOW (ref 37.5–51.0)
Hemoglobin: 12.4 g/dL — ABNORMAL LOW (ref 13.0–17.7)
Immature Grans (Abs): 0 x10E3/uL (ref 0.0–0.1)
Immature Granulocytes: 0 %
Lymphocytes Absolute: 1.5 x10E3/uL (ref 0.7–3.1)
Lymphs: 17 %
MCH: 35.2 pg — ABNORMAL HIGH (ref 26.6–33.0)
MCHC: 33.4 g/dL (ref 31.5–35.7)
MCV: 105 fL — ABNORMAL HIGH (ref 79–97)
Monocytes Absolute: 1.1 x10E3/uL — ABNORMAL HIGH (ref 0.1–0.9)
Monocytes: 12 %
Neutrophils Absolute: 5.9 x10E3/uL (ref 1.4–7.0)
Neutrophils: 67 %
Platelets: 235 x10E3/uL (ref 150–450)
RBC: 3.52 x10E6/uL — ABNORMAL LOW (ref 4.14–5.80)
RDW: 12.4 % (ref 11.6–15.4)
WBC: 8.9 x10E3/uL (ref 3.4–10.8)

## 2023-04-23 LAB — COMPREHENSIVE METABOLIC PANEL WITH GFR
ALT: 12 IU/L (ref 0–44)
AST: 13 IU/L (ref 0–40)
Albumin/Globulin Ratio: 1.2 (ref 1.2–2.2)
Albumin: 3.9 g/dL (ref 3.7–4.7)
Alkaline Phosphatase: 139 IU/L — ABNORMAL HIGH (ref 44–121)
BUN/Creatinine Ratio: 19 (ref 10–24)
BUN: 22 mg/dL (ref 8–27)
Bilirubin Total: 0.3 mg/dL (ref 0.0–1.2)
CO2: 25 mmol/L (ref 20–29)
Calcium: 11.1 mg/dL — ABNORMAL HIGH (ref 8.6–10.2)
Chloride: 104 mmol/L (ref 96–106)
Creatinine, Ser: 1.16 mg/dL (ref 0.76–1.27)
Globulin, Total: 3.3 g/dL (ref 1.5–4.5)
Glucose: 94 mg/dL (ref 70–99)
Potassium: 4.2 mmol/L (ref 3.5–5.2)
Sodium: 142 mmol/L (ref 134–144)
Total Protein: 7.2 g/dL (ref 6.0–8.5)
eGFR: 61 mL/min/1.73

## 2023-04-23 LAB — URIC ACID: Uric Acid: 6.6 mg/dL (ref 3.8–8.4)

## 2023-04-25 ENCOUNTER — Telehealth: Payer: Self-pay | Admitting: Cardiology

## 2023-04-25 DIAGNOSIS — R229 Localized swelling, mass and lump, unspecified: Secondary | ICD-10-CM | POA: Diagnosis not present

## 2023-04-25 NOTE — Telephone Encounter (Signed)
Labs faxed to [facility] at [phone] .

## 2023-04-25 NOTE — Telephone Encounter (Signed)
[facility] would like for the office to fax over the patients latest lab work that was completed. Please fax to [phone]

## 2023-04-28 DIAGNOSIS — H353221 Exudative age-related macular degeneration, left eye, with active choroidal neovascularization: Secondary | ICD-10-CM | POA: Diagnosis not present

## 2023-05-11 DIAGNOSIS — N390 Urinary tract infection, site not specified: Secondary | ICD-10-CM | POA: Diagnosis not present

## 2023-05-11 DIAGNOSIS — R509 Fever, unspecified: Secondary | ICD-10-CM | POA: Diagnosis not present

## 2023-05-12 DIAGNOSIS — I4891 Unspecified atrial fibrillation: Secondary | ICD-10-CM | POA: Diagnosis not present

## 2023-05-12 DIAGNOSIS — K219 Gastro-esophageal reflux disease without esophagitis: Secondary | ICD-10-CM | POA: Diagnosis not present

## 2023-05-12 DIAGNOSIS — J690 Pneumonitis due to inhalation of food and vomit: Secondary | ICD-10-CM | POA: Diagnosis not present

## 2023-05-12 DIAGNOSIS — N1831 Chronic kidney disease, stage 3a: Secondary | ICD-10-CM | POA: Diagnosis not present

## 2023-05-12 DIAGNOSIS — I5022 Chronic systolic (congestive) heart failure: Secondary | ICD-10-CM | POA: Diagnosis not present

## 2023-05-12 DIAGNOSIS — R1310 Dysphagia, unspecified: Secondary | ICD-10-CM | POA: Diagnosis not present

## 2023-05-12 DIAGNOSIS — J9621 Acute and chronic respiratory failure with hypoxia: Secondary | ICD-10-CM | POA: Diagnosis not present

## 2023-05-14 DIAGNOSIS — R531 Weakness: Secondary | ICD-10-CM | POA: Diagnosis not present

## 2023-05-14 DIAGNOSIS — R6889 Other general symptoms and signs: Secondary | ICD-10-CM | POA: Diagnosis not present

## 2023-05-14 DIAGNOSIS — I499 Cardiac arrhythmia, unspecified: Secondary | ICD-10-CM | POA: Diagnosis not present

## 2023-05-14 DIAGNOSIS — Z743 Need for continuous supervision: Secondary | ICD-10-CM | POA: Diagnosis not present

## 2023-05-14 DIAGNOSIS — I509 Heart failure, unspecified: Secondary | ICD-10-CM | POA: Diagnosis not present

## 2023-05-14 DIAGNOSIS — R0989 Other specified symptoms and signs involving the circulatory and respiratory systems: Secondary | ICD-10-CM | POA: Diagnosis not present

## 2023-05-14 DIAGNOSIS — R509 Fever, unspecified: Secondary | ICD-10-CM | POA: Diagnosis not present

## 2023-05-14 DIAGNOSIS — R059 Cough, unspecified: Secondary | ICD-10-CM | POA: Diagnosis not present

## 2023-05-14 DIAGNOSIS — R918 Other nonspecific abnormal finding of lung field: Secondary | ICD-10-CM | POA: Diagnosis not present

## 2023-05-14 DIAGNOSIS — R0902 Hypoxemia: Secondary | ICD-10-CM | POA: Diagnosis not present

## 2023-05-15 DIAGNOSIS — Z9981 Dependence on supplemental oxygen: Secondary | ICD-10-CM | POA: Diagnosis not present

## 2023-05-15 DIAGNOSIS — M1990 Unspecified osteoarthritis, unspecified site: Secondary | ICD-10-CM | POA: Diagnosis not present

## 2023-05-15 DIAGNOSIS — R531 Weakness: Secondary | ICD-10-CM | POA: Diagnosis not present

## 2023-05-15 DIAGNOSIS — E039 Hypothyroidism, unspecified: Secondary | ICD-10-CM | POA: Diagnosis not present

## 2023-05-15 DIAGNOSIS — E860 Dehydration: Secondary | ICD-10-CM | POA: Diagnosis not present

## 2023-05-15 DIAGNOSIS — J690 Pneumonitis due to inhalation of food and vomit: Secondary | ICD-10-CM | POA: Diagnosis not present

## 2023-05-15 DIAGNOSIS — R059 Cough, unspecified: Secondary | ICD-10-CM | POA: Diagnosis not present

## 2023-05-15 DIAGNOSIS — A419 Sepsis, unspecified organism: Secondary | ICD-10-CM | POA: Diagnosis not present

## 2023-05-15 DIAGNOSIS — I110 Hypertensive heart disease with heart failure: Secondary | ICD-10-CM | POA: Diagnosis not present

## 2023-05-15 DIAGNOSIS — R0989 Other specified symptoms and signs involving the circulatory and respiratory systems: Secondary | ICD-10-CM | POA: Diagnosis not present

## 2023-05-15 DIAGNOSIS — Z79899 Other long term (current) drug therapy: Secondary | ICD-10-CM | POA: Diagnosis not present

## 2023-05-15 DIAGNOSIS — R0902 Hypoxemia: Secondary | ICD-10-CM | POA: Diagnosis not present

## 2023-05-15 DIAGNOSIS — R918 Other nonspecific abnormal finding of lung field: Secondary | ICD-10-CM | POA: Diagnosis not present

## 2023-05-15 DIAGNOSIS — R1310 Dysphagia, unspecified: Secondary | ICD-10-CM | POA: Diagnosis not present

## 2023-05-15 DIAGNOSIS — R509 Fever, unspecified: Secondary | ICD-10-CM | POA: Diagnosis not present

## 2023-05-15 DIAGNOSIS — I480 Paroxysmal atrial fibrillation: Secondary | ICD-10-CM | POA: Diagnosis not present

## 2023-05-15 DIAGNOSIS — H1033 Unspecified acute conjunctivitis, bilateral: Secondary | ICD-10-CM | POA: Diagnosis not present

## 2023-05-15 DIAGNOSIS — I5022 Chronic systolic (congestive) heart failure: Secondary | ICD-10-CM | POA: Diagnosis not present

## 2023-05-15 DIAGNOSIS — Z792 Long term (current) use of antibiotics: Secondary | ICD-10-CM | POA: Diagnosis not present

## 2023-05-15 DIAGNOSIS — Z7401 Bed confinement status: Secondary | ICD-10-CM | POA: Diagnosis not present

## 2023-05-15 DIAGNOSIS — Z1152 Encounter for screening for COVID-19: Secondary | ICD-10-CM | POA: Diagnosis not present

## 2023-05-15 DIAGNOSIS — J9601 Acute respiratory failure with hypoxia: Secondary | ICD-10-CM | POA: Diagnosis not present

## 2023-05-15 DIAGNOSIS — D649 Anemia, unspecified: Secondary | ICD-10-CM | POA: Diagnosis not present

## 2023-05-15 DIAGNOSIS — I509 Heart failure, unspecified: Secondary | ICD-10-CM | POA: Diagnosis not present

## 2023-05-15 DIAGNOSIS — I447 Left bundle-branch block, unspecified: Secondary | ICD-10-CM | POA: Diagnosis not present

## 2023-05-17 DIAGNOSIS — I447 Left bundle-branch block, unspecified: Secondary | ICD-10-CM | POA: Diagnosis not present

## 2023-05-20 DIAGNOSIS — J690 Pneumonitis due to inhalation of food and vomit: Secondary | ICD-10-CM | POA: Diagnosis not present

## 2023-05-20 DIAGNOSIS — I5022 Chronic systolic (congestive) heart failure: Secondary | ICD-10-CM | POA: Diagnosis not present

## 2023-05-25 ENCOUNTER — Encounter: Payer: Self-pay | Admitting: Internal Medicine

## 2023-06-01 DIAGNOSIS — R059 Cough, unspecified: Secondary | ICD-10-CM | POA: Diagnosis not present

## 2023-06-02 DIAGNOSIS — I5022 Chronic systolic (congestive) heart failure: Secondary | ICD-10-CM | POA: Diagnosis not present

## 2023-06-14 ENCOUNTER — Encounter: Payer: Self-pay | Admitting: Cardiology

## 2023-06-14 ENCOUNTER — Ambulatory Visit: Payer: Medicare Other | Attending: Cardiology | Admitting: Cardiology

## 2023-06-14 VITALS — BP 90/62 | HR 76 | Ht 71.0 in | Wt 135.2 lb

## 2023-06-14 DIAGNOSIS — I5022 Chronic systolic (congestive) heart failure: Secondary | ICD-10-CM

## 2023-06-14 DIAGNOSIS — I420 Dilated cardiomyopathy: Secondary | ICD-10-CM | POA: Diagnosis not present

## 2023-06-14 DIAGNOSIS — R0609 Other forms of dyspnea: Secondary | ICD-10-CM

## 2023-06-14 DIAGNOSIS — I10 Essential (primary) hypertension: Secondary | ICD-10-CM

## 2023-06-14 DIAGNOSIS — I480 Paroxysmal atrial fibrillation: Secondary | ICD-10-CM | POA: Diagnosis not present

## 2023-06-14 NOTE — Progress Notes (Signed)
Cardiology Office Note:    Date:  06/14/2023   ID:  FEDERICK DACE, DOB May 15, 1932, MRN [id number]  PCP:  Ailene Ravel, MD  Cardiologist:  [name], MD    Referring MD: Ailene Ravel, MD   Chief Complaint  Patient presents with   Medication Management   Congestive Heart Failure         History of Present Illness:    Curtis Ramos is a 89 y.o. male with past medical history significant for nonischemic cardiomyopathy with latest ejection fraction from February 2024 by MRI 17%, severe left atrial enlargement, paroxysmal atrial fibrillation and was on anticoagulation however after hospitalization fibroidectomy discontinued this is secondary to GI bleed.  Since I seen him last time he end up having cardiac arrest in February he was fine downtime CPR was done when he lost his balance while coming to the emergency room 9 minutes CPR 4 rounds of epi however he pulled to he lives in nursing home.  Hospitalization was prolonged recently and up having pneumonia comes today and I have to admit he looks better than anticipated.  He been out in his much more fragile clearly but cheerful and he had quite nice conversation about different topics.  He is staying at [facility] nursing, he enjoyed.  He said food is not good but overall does not complain  Past Medical History:  Diagnosis Date   Acute on chronic combined systolic and diastolic CHF (congestive heart failure) (HCC) 03/19/2019   AKI (acute kidney injury) (HCC) 12/26/2014   Arthralgia 11/04/2014   Arthritis    "proabably"   Atypical mycobacterium infection    Cancer [facility])    family unaware of this hx on 12/26/2014   Chronic systolic heart failure (HCC)    Decreased oral intake 12/26/2014   Decreased oral intake 12/26/2014   Depressed left ventricular ejection fraction 02/05/2020   Dilated cardiomyopathy (HCC) 03/19/2019   Ejection fraction 20 to 25% based on echocardiogram done by pulmonologist month ago.   DJD (degenerative joint  disease) 10/07/2014   Dyspnea on exertion 03/19/2019   Essential hypertension 02/05/2020   GERD (gastroesophageal reflux disease)    Hammer toes of both feet 01/30/2018   History of thyroid cancer 12/16/2016   Hypercalcemia 12/26/2014   Hypothyroidism    Hypothyroidism associated with surgical procedure 10/07/2014   Lung mass    Myalgia 11/04/2014   Mycobacterium avium complex (HCC)    Nail dystrophy 01/30/2018   Occult malignancy (HCC)    PAF (paroxysmal atrial fibrillation) (HCC) 02/05/2020   Persistent atrial fibrillation (HCC) 03/19/2019   Chads 2 Vascor equals 3   PONV (postoperative nausea and vomiting)    Pre-ulcerative calluses 01/30/2018   Pseudoaneurysm following procedure (HCC) 04/16/2020   Sensorineural hearing loss (SNHL), bilateral 08/11/2018   Shingles    Toenail fungus 01/30/2018   Wears glasses     Past Surgical History:  Procedure Laterality Date   APPENDECTOMY     CATARACT EXTRACTION W/ INTRAOCULAR LENS  IMPLANT, BILATERAL Bilateral    COLONOSCOPY     INGUINAL HERNIA REPAIR Left    IR GASTROSTOMY TUBE MOD SED  02/09/2023   IR GASTROSTOMY TUBE REMOVAL  04/21/2023   LEFT HEART CATH AND CORONARY ANGIOGRAPHY N/A 03/11/2020   Procedure: LEFT HEART CATH AND CORONARY ANGIOGRAPHY;  Surgeon: Corky Crafts, MD;  Location: [facility];  Service: Cardiovascular;  Laterality: N/A;   MASS EXCISION Right 08/27/2014   Procedure: EXCISION MASS RIGHT PALM/INDEX;  Surgeon:  Betha Loa, MD;  Location: [facility];  Service: Orthopedics;  Laterality: Right;   THYROIDECTOMY  2003    Current Medications: Current Meds  Medication Sig   acetaminophen (TYLENOL) 325 MG tablet Take 650 mg by mouth every 6 (six) hours as needed for mild pain or moderate pain.   acetaminophen (TYLENOL) 500 MG tablet Take 2 tablets (1,000 mg total) by mouth every 6 (six) hours as needed for mild pain, headache or fever.   amiodarone (PACERONE) 200 MG tablet Take 1 tablet (200 mg total) by  mouth 2 (two) times daily. Needs appointment for future refills / 1st attempt   DOCUSATE SODIUM PO Take 1 tablet by mouth daily.   feeding supplement (ENSURE ENLIVE / ENSURE PLUS) LIQD Take 237 mLs by mouth 2 (two) times daily between meals.   furosemide (LASIX) 40 MG tablet Take 40 mg by mouth daily.   hydrocortisone (ANUSOL-HC) 2.5 % rectal cream Place 1 Application rectally 4 (four) times daily as needed for hemorrhoids or anal itching.   ketoconazole (NIZORAL) 2 % shampoo Apply 1 Application topically every evening.   LACTOBACILLUS RHAMNOSUS, GG, PO Take 500 mg by mouth 2 (two) times daily.   lansoprazole (PREVACID) 30 MG capsule Take 30 mg by mouth daily at 12 noon.   Melatonin 3 MG TBDP Take 1 tablet by mouth at bedtime as needed (Sleep).   mirtazapine (REMERON) 7.5 MG tablet Take 1 tablet (7.5 mg total) by mouth at bedtime.   Multiple Vitamins-Minerals (PRESERVISION AREDS PO) Take 1 tablet by mouth daily.   SYNTHROID 175 MCG tablet Take 175 mcg by mouth daily.   Water For Irrigation, Sterile (FREE WATER) SOLN Place 100 mLs into feeding tube 6 (six) times daily.   [DISCONTINUED] lidocaine (LIDODERM) 5 % Place 1 patch onto the skin daily. Remove & Discard patch within 12 hours or as directed by MD   [DISCONTINUED] losartan (COZAAR) 25 MG tablet Take 0.5 tablets (12.5 mg total) by mouth daily.   [DISCONTINUED] Nutritional Supplements (FEEDING SUPPLEMENT, OSMOLITE 1.5 CAL,) LIQD Place 237 mLs into feeding tube 6 (six) times daily.   [DISCONTINUED] pantoprazole (PROTONIX) 40 MG tablet Take 1 tablet (40 mg total) by mouth 2 (two) times daily.   [DISCONTINUED] polyethylene glycol (MIRALAX / GLYCOLAX) 17 g packet Take 17 g by mouth daily.   [DISCONTINUED] spironolactone (ALDACTONE) 25 MG tablet Take 0.5 tablets (12.5 mg total) by mouth daily.     Allergies:   Patient has no known allergies.   Social History   Socioeconomic History   Marital status: Married    Spouse name: Not on file    Number of children: Not on file   Years of education: Not on file   Highest education level: Not on file  Occupational History   Not on file  Tobacco Use   Smoking status: Never   Smokeless tobacco: Never  Substance and Sexual Activity   Alcohol use: No   Drug use: No   Sexual activity: Not on file  Other Topics Concern   Not on file  Social History Narrative   Not on file   Social Determinants of Health   Financial Resource Strain: Low Risk  (01/18/2023)   Overall Financial Resource Strain (CARDIA)    Difficulty of Paying Living Expenses: Not hard at all  Food Insecurity: No Food Insecurity (01/18/2023)   Hunger Vital Sign    Worried About Running Out of Food in the Last Year: Never true    Ran  Out of Food in the Last Year: Never true  Transportation Needs: No Transportation Needs (01/18/2023)   PRAPARE - Administrator, Civil Service (Medical): No    Lack of Transportation (Non-Medical): No  Physical Activity: Not on file  Stress: Not on file  Social Connections: Not on file     Family History: The patient's family history is not on file. ROS:   Please see the history of present illness.    All 14 point review of systems negative except as described per history of present illness  EKGs/Labs/Other Studies Reviewed:    EKG Interpretation Date/Time:  Tuesday June 14 2023 10:07:45 EDT Ventricular Rate:  76 PR Interval:  282 QRS Duration:  176 QT Interval:  478 QTC Calculation: 537 R Axis:   -69  Text Interpretation: Sinus rhythm with 1st degree A-V block with Premature atrial complexes with Abberant conduction Left axis deviation Left bundle branch block Abnormal ECG When compared with ECG of 22-Jan-2023 14:27, Abberant conduction is now Present PR interval has increased QT has lengthened Confirmed by [name] [phone]) on 06/14/2023 10:25:40 AM    Recent Labs: 10/05/2022: NT-Pro BNP 3,434 01/13/2023: B Natriuretic Peptide 1,073.1 02/11/2023: Magnesium  2.1; TSH 23.002 04/22/2023: ALT 12; BUN 22; Creatinine, Ser 1.16; Hemoglobin 12.4; Platelets 235; Potassium 4.2; Sodium 142  Recent Lipid Panel    Component Value Date/Time   TRIG 96 01/20/2023 0425   LDLDIRECT 80 11/20/2021 1523    Physical Exam:    VS:  BP 90/62 (BP Location: Left Arm, Patient Position: Sitting)   Pulse 76   Ht 5\' 11"  (1.803 m)   Wt 135 lb 3.2 oz (61.3 kg)   SpO2 93%   BMI 18.86 kg/m     Wt Readings from Last 3 Encounters:  06/14/23 135 lb 3.2 oz (61.3 kg)  04/22/23 139 lb 9.6 oz (63.3 kg)  02/10/23 128 lb 4.9 oz (58.2 kg)     GEN:  Well nourished, well developed in no acute distress HEENT: Normal NECK: No JVD; No carotid bruits LYMPHATICS: No lymphadenopathy CARDIAC: RRR, no murmurs, no rubs, no gallops RESPIRATORY:  Clear to auscultation without rales, wheezing or rhonchi  ABDOMEN: Soft, non-tender, non-distended MUSCULOSKELETAL:  No edema; No deformity  SKIN: Warm and dry LOWER EXTREMITIES: no swelling NEUROLOGIC:  Alert and oriented x 3 PSYCHIATRIC:  Normal affect   ASSESSMENT:    1. Essential hypertension   2. Chronic systolic CHF (congestive heart failure) (HCC)   3. Dilated cardiomyopathy (HCC)   4. PAF (paroxysmal atrial fibrillation) (HCC)   5. Dyspnea on exertion    PLAN:    In order of problems listed above:  Cardiomyopathy which is severely reduced ejection fraction 70% by MRI from February.  Difficulty getting appropriate medication because of blood pressure being low.  Recently dose of diuretic has been increased.  I will ask him to have Chem-7 as well as magnesium today, will also check proBNP and CBC. Paroxysmal atrial fibrillation seems like he is maintaining sinus rhythm today first-degree AV block.  I will check CBC however I do not think he will be candidate for anticoagulation. Issue of ICD is still there.  I do not honestly think that he will be candidate.  However he wants to see EP to have discussion about this which would  be reasonable but because of his fragility and advanced disease I am not sure if this is good options theoretically with his left bundle branch block we can improve his  left ventricle ejection fraction but him being so skin and fragile I doubt he will be candidate for it.  Family perfectly understand this but still want to get opinion from EP team. Dyspnea exertion related to his cardiomyopathy.   Medication Adjustments/Labs and Tests Ordered: Current medicines are reviewed at length with the patient today.  Concerns regarding medicines are outlined above.  Orders Placed This Encounter  Procedures   EKG 12-Lead   EKG 12-Lead   Medication changes: No orders of the defined types were placed in this encounter.   Signed, Georgeanna Lea, MD, [facility] 06/14/2023 10:41 AM    [facility]

## 2023-06-14 NOTE — Addendum Note (Signed)
Addended by: Baldo Ash D on: 06/14/2023 10:50 AM   Modules accepted: Orders

## 2023-06-14 NOTE — Patient Instructions (Signed)
Medication Instructions:  Your physician recommends that you continue on your current medications as directed. Please refer to the Current Medication list given to you today.  *If you need a refill on your cardiac medications before your next appointment, please call your pharmacy*   Lab Work: BMP, Mgm, ProBNP- today If you have labs (blood work) drawn today and your tests are completely normal, you will receive your results only by: MyChart Message (if you have MyChart) OR A paper copy in the mail If you have any lab test that is abnormal or we need to change your treatment, we will call you to review the results.   Testing/Procedures: None Ordered   Follow-Up: At [facility], you and your health needs are our priority.  As part of our continuing mission to provide you with exceptional heart care, we have created designated Provider Care Teams.  These Care Teams include your primary Cardiologist (physician) and Advanced Practice Providers (APPs -  Physician Assistants and Nurse Practitioners) who all work together to provide you with the care you need, when you need it.  We recommend signing up for the patient portal called "MyChart".  Sign up information is provided on this After Visit Summary.  MyChart is used to connect with patients for Virtual Visits (Telemedicine).  Patients are able to view lab/test results, encounter notes, upcoming appointments, etc.  Non-urgent messages can be sent to your provider as well.   To learn more about what you can do with MyChart, go to [email or web address].    Your next appointment:   3 month(s)  The format for your next appointment:   In Person  Provider:   [name], MD    Other Instructions Appt with Dr. Elberta Fortis

## 2023-06-15 DIAGNOSIS — E039 Hypothyroidism, unspecified: Secondary | ICD-10-CM | POA: Diagnosis not present

## 2023-06-15 DIAGNOSIS — I119 Hypertensive heart disease without heart failure: Secondary | ICD-10-CM | POA: Diagnosis not present

## 2023-06-15 DIAGNOSIS — N1831 Chronic kidney disease, stage 3a: Secondary | ICD-10-CM | POA: Diagnosis not present

## 2023-06-15 DIAGNOSIS — R5381 Other malaise: Secondary | ICD-10-CM | POA: Diagnosis not present

## 2023-06-15 DIAGNOSIS — D649 Anemia, unspecified: Secondary | ICD-10-CM | POA: Diagnosis not present

## 2023-06-15 DIAGNOSIS — E441 Mild protein-calorie malnutrition: Secondary | ICD-10-CM | POA: Diagnosis not present

## 2023-06-15 DIAGNOSIS — I5022 Chronic systolic (congestive) heart failure: Secondary | ICD-10-CM | POA: Diagnosis not present

## 2023-06-20 ENCOUNTER — Ambulatory Visit: Payer: Medicare Other | Attending: Cardiology | Admitting: Cardiology

## 2023-06-20 ENCOUNTER — Encounter: Payer: Self-pay | Admitting: Cardiology

## 2023-06-20 ENCOUNTER — Encounter (HOSPITAL_COMMUNITY): Payer: Medicare Other

## 2023-06-20 VITALS — BP 116/64 | HR 43 | Ht 71.0 in | Wt 136.0 lb

## 2023-06-20 DIAGNOSIS — I4819 Other persistent atrial fibrillation: Secondary | ICD-10-CM | POA: Diagnosis not present

## 2023-06-20 DIAGNOSIS — D6869 Other thrombophilia: Secondary | ICD-10-CM | POA: Diagnosis not present

## 2023-06-20 DIAGNOSIS — I5022 Chronic systolic (congestive) heart failure: Secondary | ICD-10-CM

## 2023-06-20 NOTE — Patient Instructions (Signed)
Medication Instructions:  Your physician recommends that you continue on your current medications as directed. Please refer to the Current Medication list given to you today.  *If you need a refill on your cardiac medications before your next appointment, please call your pharmacy*   Lab Work: None ordered   Testing/Procedures: None ordered   Follow-Up: At [facility], you and your health needs are our priority.  As part of our continuing mission to provide you with exceptional heart care, we have created designated Provider Care Teams.  These Care Teams include your primary Cardiologist (physician) and Advanced Practice Providers (APPs -  Physician Assistants and Nurse Practitioners) who all work together to provide you with the care you need, when you need it.   Your next appointment:   November/ December  The format for your next appointment:   In Person  Provider:   Loman Brooklyn, MD    Thank you for choosing [facility]!!   Dory Horn, RN [phone]

## 2023-06-20 NOTE — Progress Notes (Signed)
  Electrophysiology Office Note:   Date:  06/20/2023  ID:  Curtis Ramos, DOB [birth year], MRN [id number]  Primary Cardiologist: None Electrophysiologist: Lanier Prude, MD      History of Present Illness:   Curtis Ramos is a 89 y.o. male with h/o chronic systolic heart failure, hypertension, hypothyroidism, atrial fibrillation seen today for  for Electrophysiology evaluation of chronic systolic heart failure at the request of [name].      He has a longstanding history of atrial fibrillation and heart failure.  He presented to the hospital 01/13/2023 after an unwitnessed fall at home.  He was found to be unresponsive.  He was found to be pulseless and apneic with a monitor showing PEA arrest.  CPR was administered.  He had a prolonged hospitalization and was discharged to SNF.  He has been increasing his physical activity since his hospitalization.  He is now on amiodarone.     Review of systems complete and found to be negative unless listed in HPI.   EP Information / Studies Reviewed:    EKG is not ordered today. EKG from 06/14/2023 reviewed which showed sinus rhythm, left bundle branch block, PVCs        Risk Assessment/Calculations:    CHA2DS2-VASc Score = 5   This indicates a 7.2% annual risk of stroke. The patient's score is based upon: CHF History: 1 HTN History: 1 Diabetes History: 0 Stroke History: 0 Vascular Disease History: 1 Age Score: 2 Gender Score: 0             Physical Exam:   VS:  BP 116/64   Pulse (!) 43   Ht 5\' 11"  (1.803 m)   Wt 136 lb (61.7 kg)   SpO2 99%   BMI 18.97 kg/m    Wt Readings from Last 3 Encounters:  06/20/23 136 lb (61.7 kg)  06/14/23 135 lb 3.2 oz (61.3 kg)  04/22/23 139 lb 9.6 oz (63.3 kg)     GEN: Well nourished, well developed in no acute distress NECK: No JVD; No carotid bruits CARDIAC: Regular rate and rhythm, no murmurs, rubs, gallops RESPIRATORY:  Clear to auscultation without rales, wheezing or rhonchi   ABDOMEN: Soft, non-tender, non-distended EXTREMITIES:  No edema; No deformity   ASSESSMENT AND PLAN:    1.  Chronic systolic heart failure: Due to nonischemic cardiomyopathy, confirmed by cardiac MRI.  He has a left bundle branch block.  He did have a recent arrest, but this was in an shockable rhythm thought due to PEA.  At this point, we  hold off on ICD therapy.  We did discuss the possibility of CRT-P.  He is in a nursing facility undergoing rehab.   wait till he is discharged from rehab to have further discussions.  2.  Persistent atrial fibrillation: Currently on amiodarone.  Anticoagulation was stopped due to GI bleeding.  3.  Secondary hypercoagulable state: Not anticoagulated due to GI bleed   Follow up with Dr. Elberta Fortis in 3 months  Signed,  [name], MD

## 2023-06-22 ENCOUNTER — Ambulatory Visit: Payer: Medicare Other | Admitting: Cardiology

## 2023-06-22 ENCOUNTER — Telehealth: Payer: Self-pay

## 2023-06-22 NOTE — Telephone Encounter (Signed)
Patient notified through my chart.

## 2023-06-22 NOTE — Telephone Encounter (Signed)
-----   Message from [name] sent at 06/17/2023 11:25 AM EDT ----- Labs acceptable, continue present management

## 2023-07-14 DIAGNOSIS — D649 Anemia, unspecified: Secondary | ICD-10-CM | POA: Diagnosis not present

## 2023-07-14 DIAGNOSIS — I5022 Chronic systolic (congestive) heart failure: Secondary | ICD-10-CM | POA: Diagnosis not present

## 2023-07-20 DIAGNOSIS — L84 Corns and callosities: Secondary | ICD-10-CM | POA: Diagnosis not present

## 2023-07-20 DIAGNOSIS — L602 Onychogryphosis: Secondary | ICD-10-CM | POA: Diagnosis not present

## 2023-07-20 DIAGNOSIS — I739 Peripheral vascular disease, unspecified: Secondary | ICD-10-CM | POA: Diagnosis not present

## 2023-07-20 DIAGNOSIS — L603 Nail dystrophy: Secondary | ICD-10-CM | POA: Diagnosis not present

## 2023-08-02 DIAGNOSIS — E039 Hypothyroidism, unspecified: Secondary | ICD-10-CM | POA: Diagnosis not present

## 2023-08-11 DIAGNOSIS — H353221 Exudative age-related macular degeneration, left eye, with active choroidal neovascularization: Secondary | ICD-10-CM | POA: Diagnosis not present

## 2023-08-13 DIAGNOSIS — I119 Hypertensive heart disease without heart failure: Secondary | ICD-10-CM | POA: Diagnosis not present

## 2023-08-13 DIAGNOSIS — I5022 Chronic systolic (congestive) heart failure: Secondary | ICD-10-CM | POA: Diagnosis not present

## 2023-08-13 DIAGNOSIS — M2550 Pain in unspecified joint: Secondary | ICD-10-CM | POA: Diagnosis not present

## 2023-08-13 DIAGNOSIS — K5900 Constipation, unspecified: Secondary | ICD-10-CM | POA: Diagnosis not present

## 2023-08-13 DIAGNOSIS — D649 Anemia, unspecified: Secondary | ICD-10-CM | POA: Diagnosis not present

## 2023-08-13 DIAGNOSIS — N1831 Chronic kidney disease, stage 3a: Secondary | ICD-10-CM | POA: Diagnosis not present

## 2023-08-13 DIAGNOSIS — I4891 Unspecified atrial fibrillation: Secondary | ICD-10-CM | POA: Diagnosis not present

## 2023-08-25 DIAGNOSIS — R109 Unspecified abdominal pain: Secondary | ICD-10-CM | POA: Diagnosis not present

## 2023-09-13 DIAGNOSIS — I4891 Unspecified atrial fibrillation: Secondary | ICD-10-CM | POA: Diagnosis not present

## 2023-09-13 DIAGNOSIS — I5022 Chronic systolic (congestive) heart failure: Secondary | ICD-10-CM | POA: Diagnosis not present

## 2023-09-13 DIAGNOSIS — I10 Essential (primary) hypertension: Secondary | ICD-10-CM | POA: Diagnosis not present

## 2023-09-13 DIAGNOSIS — I442 Atrioventricular block, complete: Secondary | ICD-10-CM | POA: Diagnosis not present

## 2023-09-13 DIAGNOSIS — J9611 Chronic respiratory failure with hypoxia: Secondary | ICD-10-CM | POA: Diagnosis not present

## 2023-09-13 DIAGNOSIS — N1831 Chronic kidney disease, stage 3a: Secondary | ICD-10-CM | POA: Diagnosis not present

## 2023-09-13 DIAGNOSIS — E441 Mild protein-calorie malnutrition: Secondary | ICD-10-CM | POA: Diagnosis not present

## 2023-09-13 DIAGNOSIS — D649 Anemia, unspecified: Secondary | ICD-10-CM | POA: Diagnosis not present

## 2023-09-13 DIAGNOSIS — I119 Hypertensive heart disease without heart failure: Secondary | ICD-10-CM | POA: Diagnosis not present

## 2023-09-13 DIAGNOSIS — R001 Bradycardia, unspecified: Secondary | ICD-10-CM | POA: Diagnosis not present

## 2023-09-13 DIAGNOSIS — I1310 Hypertensive heart and chronic kidney disease without heart failure, with stage 1 through stage 4 chronic kidney disease, or unspecified chronic kidney disease: Secondary | ICD-10-CM | POA: Diagnosis not present

## 2023-09-13 DIAGNOSIS — M2550 Pain in unspecified joint: Secondary | ICD-10-CM | POA: Diagnosis not present

## 2023-09-13 DIAGNOSIS — I5023 Acute on chronic systolic (congestive) heart failure: Secondary | ICD-10-CM | POA: Diagnosis not present

## 2023-09-13 DIAGNOSIS — K5900 Constipation, unspecified: Secondary | ICD-10-CM | POA: Diagnosis not present

## 2023-09-14 ENCOUNTER — Encounter: Payer: Self-pay | Admitting: Cardiology

## 2023-09-14 ENCOUNTER — Ambulatory Visit: Payer: Medicare Other | Attending: Cardiology | Admitting: Cardiology

## 2023-09-14 VITALS — BP 126/72 | HR 78 | Ht 70.0 in | Wt 146.4 lb

## 2023-09-14 DIAGNOSIS — I10 Essential (primary) hypertension: Secondary | ICD-10-CM | POA: Diagnosis not present

## 2023-09-14 DIAGNOSIS — I4819 Other persistent atrial fibrillation: Secondary | ICD-10-CM

## 2023-09-14 DIAGNOSIS — R0609 Other forms of dyspnea: Secondary | ICD-10-CM | POA: Diagnosis not present

## 2023-09-14 DIAGNOSIS — I420 Dilated cardiomyopathy: Secondary | ICD-10-CM | POA: Diagnosis not present

## 2023-09-14 DIAGNOSIS — I480 Paroxysmal atrial fibrillation: Secondary | ICD-10-CM | POA: Diagnosis not present

## 2023-09-14 NOTE — Addendum Note (Signed)
Addended by: Baldo Ash D on: 09/14/2023 02:31 PM   Modules accepted: Orders

## 2023-09-14 NOTE — Progress Notes (Signed)
Cardiology Office Note:    Date:  09/14/2023   ID:  Curtis Ramos, DOB May 09, 1932, MRN [id number]  PCP:  Ailene Ravel, MD  Cardiologist:  [name], MD    Referring MD: Ailene Ravel, MD   Chief Complaint  Patient presents with   Follow-up    History of Present Illness:    Curtis Ramos is a 89 y.o. male past medical history significant for nonischemic cardiomyopathy last estimation in February 2024 by MRI with ejection fraction 70%, several severe left atrium enlargement, paroxysmal atrial fibrillation he was on anticoagulation however recently did been discontinued because of GI bleeding and fragility.  Story getting any worse in February he had cardiac arrest required CPR he was down for about 9 minutes, 4 rounds of epi has been given after that he moved to nursing home he comes today to my office after admit he looks terrific he is cheerful happy he is does not have any shortness of breath no chest pain no palpitation no dizziness tomorrow actually he goes to EMS team did save his life on the field.  He is emotional about it  Past Medical History:  Diagnosis Date   Acute on chronic combined systolic and diastolic CHF (congestive heart failure) (HCC) 03/19/2019   AKI (acute kidney injury) (HCC) 12/26/2014   Arthralgia 11/04/2014   Arthritis    "proabably"   Atypical mycobacterium infection    Cancer [facility])    family unaware of this hx on 12/26/2014   Chronic systolic heart failure (HCC)    Decreased oral intake 12/26/2014   Decreased oral intake 12/26/2014   Depressed left ventricular ejection fraction 02/05/2020   Dilated cardiomyopathy (HCC) 03/19/2019   Ejection fraction 20 to 25% based on echocardiogram done by pulmonologist month ago.   DJD (degenerative joint disease) 10/07/2014   Dyspnea on exertion 03/19/2019   Essential hypertension 02/05/2020   GERD (gastroesophageal reflux disease)    Hammer toes of both feet 01/30/2018   History of thyroid cancer 12/16/2016    Hypercalcemia 12/26/2014   Hypothyroidism    Hypothyroidism associated with surgical procedure 10/07/2014   Lung mass    Myalgia 11/04/2014   Mycobacterium avium complex (HCC)    Nail dystrophy 01/30/2018   Occult malignancy (HCC)    PAF (paroxysmal atrial fibrillation) (HCC) 02/05/2020   Persistent atrial fibrillation (HCC) 03/19/2019   Chads 2 Vascor equals 3   PONV (postoperative nausea and vomiting)    Pre-ulcerative calluses 01/30/2018   Pseudoaneurysm following procedure (HCC) 04/16/2020   Sensorineural hearing loss (SNHL), bilateral 08/11/2018   Shingles    Toenail fungus 01/30/2018   Wears glasses     Past Surgical History:  Procedure Laterality Date   APPENDECTOMY     CATARACT EXTRACTION W/ INTRAOCULAR LENS  IMPLANT, BILATERAL Bilateral    COLONOSCOPY     INGUINAL HERNIA REPAIR Left    IR GASTROSTOMY TUBE MOD SED  02/09/2023   IR GASTROSTOMY TUBE REMOVAL  04/21/2023   LEFT HEART CATH AND CORONARY ANGIOGRAPHY N/A 03/11/2020   Procedure: LEFT HEART CATH AND CORONARY ANGIOGRAPHY;  Surgeon: Corky Crafts, MD;  Location: [facility];  Service: Cardiovascular;  Laterality: N/A;   MASS EXCISION Right 08/27/2014   Procedure: EXCISION MASS RIGHT PALM/INDEX;  Surgeon: Betha Loa, MD;  Location: [facility];  Service: Orthopedics;  Laterality: Right;   THYROIDECTOMY  2003    Current Medications: Current Meds  Medication Sig   acetaminophen (TYLENOL) 325 MG tablet  Take 650 mg by mouth every 6 (six) hours as needed for mild pain or moderate pain.   acetaminophen (TYLENOL) 500 MG tablet Take 2 tablets (1,000 mg total) by mouth every 6 (six) hours as needed for mild pain, headache or fever.   amiodarone (PACERONE) 200 MG tablet Take 1 tablet (200 mg total) by mouth 2 (two) times daily. Needs appointment for future refills / 1st attempt   DOCUSATE SODIUM PO Take 1 tablet by mouth daily.   feeding supplement (ENSURE ENLIVE / ENSURE PLUS) LIQD Take 237 mLs by mouth 2  (two) times daily between meals.   furosemide (LASIX) 40 MG tablet Take 40 mg by mouth daily.   hydrocortisone (ANUSOL-HC) 2.5 % rectal cream Place 1 Application rectally 4 (four) times daily as needed for hemorrhoids or anal itching.   ketoconazole (NIZORAL) 2 % shampoo Apply 1 Application topically every evening.   LACTOBACILLUS RHAMNOSUS, GG, PO Take 500 mg by mouth 2 (two) times daily.   lansoprazole (PREVACID) 30 MG capsule Take 30 mg by mouth daily at 12 noon.   Melatonin 3 MG TBDP Take 1 tablet by mouth at bedtime as needed (Sleep).   mirtazapine (REMERON) 7.5 MG tablet Take 1 tablet (7.5 mg total) by mouth at bedtime.   Multiple Vitamins-Minerals (PRESERVISION AREDS PO) Take 1 tablet by mouth daily.   SYNTHROID 175 MCG tablet Take 175 mcg by mouth daily.   Water For Irrigation, Sterile (FREE WATER) SOLN Place 100 mLs into feeding tube 6 (six) times daily.     Allergies:   Patient has no known allergies.   Social History   Socioeconomic History   Marital status: Married    Spouse name: Not on file   Number of children: Not on file   Years of education: Not on file   Highest education level: Not on file  Occupational History   Not on file  Tobacco Use   Smoking status: Never   Smokeless tobacco: Never  Substance and Sexual Activity   Alcohol use: No   Drug use: No   Sexual activity: Not on file  Other Topics Concern   Not on file  Social History Narrative   Not on file   Social Determinants of Health   Financial Resource Strain: Low Risk  (01/18/2023)   Overall Financial Resource Strain (CARDIA)    Difficulty of Paying Living Expenses: Not hard at all  Food Insecurity: No Food Insecurity (01/18/2023)   Hunger Vital Sign    Worried About Running Out of Food in the Last Year: Never true    Ran Out of Food in the Last Year: Never true  Transportation Needs: No Transportation Needs (01/18/2023)   PRAPARE - Administrator, Civil Service (Medical): No    Lack of  Transportation (Non-Medical): No  Physical Activity: Not on file  Stress: Not on file  Social Connections: Not on file     Family History: The patient's family history is not on file. ROS:   Please see the history of present illness.    All 14 point review of systems negative except as described per history of present illness  EKGs/Labs/Other Studies Reviewed:         Recent Labs: 01/13/2023: B Natriuretic Peptide 1,073.1 02/11/2023: TSH 23.002 04/22/2023: ALT 12; Hemoglobin 12.4; Platelets 235 06/14/2023: BUN 23; Creatinine, Ser 1.36; Magnesium 2.1; NT-Pro BNP 4,395; Potassium 4.2; Sodium 140  Recent Lipid Panel    Component Value Date/Time   TRIG 96 01/20/2023  0425   LDLDIRECT 80 11/20/2021 1523    Physical Exam:    VS:  BP 126/72 (BP Location: Left Arm, Patient Position: Sitting)   Pulse 78   Ht 5\' 10"  (1.778 m)   Wt 146 lb 6.4 oz (66.4 kg)   SpO2 96%   BMI 21.01 kg/m     Wt Readings from Last 3 Encounters:  09/14/23 146 lb 6.4 oz (66.4 kg)  06/20/23 136 lb (61.7 kg)  06/14/23 135 lb 3.2 oz (61.3 kg)     GEN:  Well nourished, well developed in no acute distress HEENT: Normal NECK: No JVD; No carotid bruits LYMPHATICS: No lymphadenopathy CARDIAC: RRR, no murmurs, no rubs, no gallops RESPIRATORY:  Clear to auscultation without rales, wheezing or rhonchi  ABDOMEN: Soft, non-tender, non-distended MUSCULOSKELETAL:  No edema; No deformity  SKIN: Warm and dry LOWER EXTREMITIES: no swelling NEUROLOGIC:  Alert and oriented x 3 PSYCHIATRIC:  Normal affect   ASSESSMENT:    1. Persistent atrial fibrillation (HCC)   2. Dilated cardiomyopathy (HCC)   3. Essential hypertension   4. PAF (paroxysmal atrial fibrillation) (HCC)   5. Dyspnea on exertion    PLAN:    In order of problems listed above:  Paroxysmal atrial fibrillation today's EKG showing sinus rhythm.  He is not anticoagulated because of fragility.  History of GI bleed.  I will check his CBC  today. History of cardiomyopathy ejection fraction 17%.  That was based on MRI done in 2024.  He is after admit looks terrific he is compensated.  He is not on any cardiac medications.  I will ask him to have Chem-7 done we will schedule him to have echocardiogram to assess left ventricle ejection fraction. Essential hypertension blood pressure well-controlled continue present management. Dyspnea exertion denies having any After admit I am very surprised seeing how good he looks he is very happy and feeling well.   Medication Adjustments/Labs and Tests Ordered: Current medicines are reviewed at length with the patient today.  Concerns regarding medicines are outlined above.  Orders Placed This Encounter  Procedures   EKG 12-Lead   Medication changes: No orders of the defined types were placed in this encounter.   Signed, Georgeanna Lea, MD, [facility] 09/14/2023 2:20 PM    [facility]

## 2023-09-14 NOTE — Patient Instructions (Signed)
Medication Instructions:  Your physician recommends that you continue on your current medications as directed. Please refer to the Current Medication list given to you today.  *If you need a refill on your cardiac medications before your next appointment, please call your pharmacy*   Lab Work: BMP, CBC-today If you have labs (blood work) drawn today and your tests are completely normal, you will receive your results only by: MyChart Message (if you have MyChart) OR A paper copy in the mail If you have any lab test that is abnormal or we need to change your treatment, we will call you to review the results.   Testing/Procedures: Your physician has requested that you have an echocardiogram. Echocardiography is a painless test that uses sound waves to create images of your heart. It provides your doctor with information about the size and shape of your heart and how well your heart's chambers and valves are working. This procedure takes approximately one hour. There are no restrictions for this procedure. Please do NOT wear cologne, perfume, aftershave, or lotions (deodorant is allowed). Please arrive 15 minutes prior to your appointment time.    Follow-Up: At [facility], you and your health needs are our priority.  As part of our continuing mission to provide you with exceptional heart care, we have created designated Provider Care Teams.  These Care Teams include your primary Cardiologist (physician) and Advanced Practice Providers (APPs -  Physician Assistants and Nurse Practitioners) who all work together to provide you with the care you need, when you need it.  We recommend signing up for the patient portal called "MyChart".  Sign up information is provided on this After Visit Summary.  MyChart is used to connect with patients for Virtual Visits (Telemedicine).  Patients are able to view lab/test results, encounter notes, upcoming appointments, etc.  Non-urgent messages can be sent to  your provider as well.   To learn more about what you can do with MyChart, go to [email or web address].    Your next appointment:   3 month(s)  The format for your next appointment:   In Person  Provider:   [name], MD    Other Instructions NA

## 2023-09-15 LAB — BASIC METABOLIC PANEL
BUN/Creatinine Ratio: 24 (ref 10–24)
BUN: 33 mg/dL — ABNORMAL HIGH (ref 8–27)
CO2: 24 mmol/L (ref 20–29)
Calcium: 11.1 mg/dL — ABNORMAL HIGH (ref 8.6–10.2)
Chloride: 101 mmol/L (ref 96–106)
Creatinine, Ser: 1.37 mg/dL — ABNORMAL HIGH (ref 0.76–1.27)
Glucose: 87 mg/dL (ref 70–99)
Potassium: 4.4 mmol/L (ref 3.5–5.2)
Sodium: 140 mmol/L (ref 134–144)
eGFR: 49 mL/min/{1.73_m2} — ABNORMAL LOW (ref >59)

## 2023-09-15 LAB — CBC
Hematocrit: 43.9 % (ref 37.5–51.0)
Hemoglobin: 14.1 g/dL (ref 13.0–17.7)
MCH: 34.7 pg — ABNORMAL HIGH (ref 26.6–33.0)
MCHC: 32.1 g/dL (ref 31.5–35.7)
MCV: 108 fL — ABNORMAL HIGH (ref 79–97)
Platelets: 200 10*3/uL (ref 150–450)
RBC: 4.06 x10E6/uL — ABNORMAL LOW (ref 4.14–5.80)
RDW: 12.1 % (ref 11.6–15.4)
WBC: 8 10*3/uL (ref 3.4–10.8)

## 2023-09-30 DIAGNOSIS — H353221 Exudative age-related macular degeneration, left eye, with active choroidal neovascularization: Secondary | ICD-10-CM | POA: Diagnosis not present

## 2023-09-30 DIAGNOSIS — H35373 Puckering of macula, bilateral: Secondary | ICD-10-CM | POA: Diagnosis not present

## 2023-09-30 DIAGNOSIS — H40013 Open angle with borderline findings, low risk, bilateral: Secondary | ICD-10-CM | POA: Diagnosis not present

## 2023-10-03 DIAGNOSIS — L602 Onychogryphosis: Secondary | ICD-10-CM | POA: Diagnosis not present

## 2023-10-03 DIAGNOSIS — L84 Corns and callosities: Secondary | ICD-10-CM | POA: Diagnosis not present

## 2023-10-03 DIAGNOSIS — L603 Nail dystrophy: Secondary | ICD-10-CM | POA: Diagnosis not present

## 2023-10-03 DIAGNOSIS — I739 Peripheral vascular disease, unspecified: Secondary | ICD-10-CM | POA: Diagnosis not present

## 2023-10-06 ENCOUNTER — Telehealth: Payer: Self-pay

## 2023-10-06 NOTE — Telephone Encounter (Signed)
Patient notified through my chart.

## 2023-10-06 NOTE — Telephone Encounter (Signed)
-----   Message from [name] sent at 09/15/2023  9:39 AM EDT ----- Labs are looking good, will wait for echocardiogram to decide what to do next in terms of medications

## 2023-10-10 ENCOUNTER — Telehealth: Payer: Self-pay

## 2023-10-10 ENCOUNTER — Ambulatory Visit: Payer: Medicare Other | Admitting: Cardiology

## 2023-10-10 NOTE — Telephone Encounter (Signed)
Spoke with Pt's son.  Pt rescheduled for 10/24/2023 at [facility] at 1:45 pm.

## 2023-10-12 DIAGNOSIS — I5022 Chronic systolic (congestive) heart failure: Secondary | ICD-10-CM | POA: Diagnosis not present

## 2023-10-12 DIAGNOSIS — I10 Essential (primary) hypertension: Secondary | ICD-10-CM | POA: Diagnosis not present

## 2023-10-12 DIAGNOSIS — E039 Hypothyroidism, unspecified: Secondary | ICD-10-CM | POA: Diagnosis not present

## 2023-10-14 DIAGNOSIS — J9611 Chronic respiratory failure with hypoxia: Secondary | ICD-10-CM | POA: Diagnosis not present

## 2023-10-14 DIAGNOSIS — D649 Anemia, unspecified: Secondary | ICD-10-CM | POA: Diagnosis not present

## 2023-10-24 ENCOUNTER — Encounter: Payer: Self-pay | Admitting: *Deleted

## 2023-10-24 ENCOUNTER — Ambulatory Visit: Payer: Medicare Other | Attending: Cardiology | Admitting: Cardiology

## 2023-10-24 ENCOUNTER — Encounter: Payer: Self-pay | Admitting: Cardiology

## 2023-10-24 VITALS — BP 124/52 | HR 33 | Ht 70.0 in | Wt 149.1 lb

## 2023-10-24 DIAGNOSIS — I739 Peripheral vascular disease, unspecified: Secondary | ICD-10-CM | POA: Diagnosis not present

## 2023-10-24 DIAGNOSIS — I4819 Other persistent atrial fibrillation: Secondary | ICD-10-CM | POA: Diagnosis not present

## 2023-10-24 DIAGNOSIS — D6869 Other thrombophilia: Secondary | ICD-10-CM

## 2023-10-24 DIAGNOSIS — I447 Left bundle-branch block, unspecified: Secondary | ICD-10-CM | POA: Diagnosis not present

## 2023-10-24 DIAGNOSIS — I5022 Chronic systolic (congestive) heart failure: Secondary | ICD-10-CM | POA: Diagnosis not present

## 2023-10-24 DIAGNOSIS — I441 Atrioventricular block, second degree: Secondary | ICD-10-CM

## 2023-10-24 NOTE — Patient Instructions (Signed)
Medication Instructions:  Your physician recommends that you continue on your current medications as directed. Please refer to the Current Medication list given to you today.     * If you need a refill on your cardiac medications before your next appointment, please call your pharmacy. *   Labwork: None ordered  * Will notify you of abnormal results, otherwise continue current treatment plan.*   Testing/Procedures: Your physician has recommended that you have a pacemaker inserted. A pacemaker is a small device that is placed under the skin of your chest or abdomen to help control abnormal heart rhythms. This device uses electrical pulses to prompt the heart to beat at a normal rate. Pacemakers are used to treat heart rhythms that are too slow. Wire (leads) are attached to the pacemaker that goes into the chambers of you heart. This is done in the hospital and usually requires and overnight stay. Please follow your instruction letter.   Follow-Up: You will be scheduled for a 2 week wound check and 3 month physician check.  Thank you for choosing [facility]!!   Dory Horn, RN [phone]   Other Instructions  Biventricular Pacemaker Implantation Biventricular pacemaker implantation is a procedure to place (implant) a pacemaker near the heart. A biventricular pacemaker is a type of pacemaker used in people with heart failure due to weak heart muscles. This procedure may be done to: Treat symptoms of severe heart failure, such as shortness of breath. Correct a heartbeat that may be too slow or irregular. A pacemaker is a small, battery-powered device that helps control the heartbeat. If the heart beats irregularly or too slowly, the pacemaker will pace the heart so that it beats at a normal rate or a programmed rate. A biventricular pacemaker will synchronize the lower chambers of the heart (ventricles) so they contract at the same time. This can help them pump more efficiently.  The parts of a biventricular pacemaker include: The pulse generator. The pulse generator contains a small computer that is programmed to keep the heart beating at a certain rate. The pulse generator also produces the electrical signal that triggers the heart to beat. This is implanted under the skin of the upper chest, near the collarbone. Wires (leads). There may be two or three leads placed in the heart--one to the right atrium, one to the right ventricle, and one through the coronary sinus to reach the left ventricle of the heart. The leads are connected to the pulse generator. They transmit electrical pulses from the pulse generator to the heart. Tell a health care provider about: Any allergies you have. All medicines you are taking, including vitamins, herbs, eye drops, creams, and over-the-counter medicines. Any problems you or family members have had with anesthetic medicines. Any bleeding problems you have. Any surgeries you have had. Any medical conditions you have. Whether you are pregnant or may be pregnant. What are the risks? Generally, this is a safe procedure. However, problems may occur, including: Infection. Swelling, bruising, or bleeding at the pacemaker site, especially if you take blood thinners. Allergic reactions to medicines or dyes. Damage to blood vessels or nerves near the pacemaker. Failure of the pacemaker to improve your condition. Collapsed lung. Blood clots. Lead failures. This may require more surgery. What happens before the procedure? Staying hydrated Follow instructions from your health care provider about hydration, which may include: Up to 2 hours before the procedure - you may continue to drink clear liquids, such as water, clear fruit juice, black  coffee, and plain tea.  Eating and drinking restrictions Follow instructions from your health care provider about eating and drinking, which may include: 8 hours before the procedure - stop eating heavy  meals or foods, such as meat, fried foods, or fatty foods. 6 hours before the procedure - stop eating light meals or foods, such as toast or cereal. 6 hours before the procedure - stop drinking milk or drinks that contain milk. 2 hours before the procedure - stop drinking clear liquids. Medicines Ask your health care provider about: Changing or stopping your regular medicines. This is especially important if you are taking diabetes medicines or blood thinners. Taking medicines such as aspirin and ibuprofen. These medicines can thin your blood. Do not take these medicines unless your health care provider tells you to take them. Taking over-the-counter medicines, vitamins, herbs, and supplements. General instructions Take steps to improve your health and fitness as told. Stopping smoking, eating a healthy diet, and exercising regularly can help speed up your recovery time and reduce the risk of complications. You may have tests, including: Blood tests. Chest X-rays. Echocardiogram. This is a test that uses sound waves (ultrasound) to produce an image of the heart. Ask your health care provider: How your surgery site will be marked. What steps will be taken to help prevent infection. These may include: Removing hair at the surgery site. Washing skin with a germ-killing soap. Taking antibiotic medicine. Plan to have a responsible adult take you home from the hospital or clinic. If you will be going home right after the procedure, plan to have a responsible adult care for you for the time you are told. This is important. What happens during the procedure?  An IV will be inserted into one of your veins. You will be connected to a heart monitor. Large electrode pads will be placed on the front and back of your chest. You will be given one or more of the following: A medicine to help you relax (sedative). A medicine to make you fall asleep (general anesthetic). A medicine that is injected into  an area of your body to numb the area (local anesthetic). An incision will be made in your upper chest, near your heart. The leads will be guided into your incision, through your blood vessels, and into your heart. Your health care provider will use an X-ray machine (fluoroscope) to guide the leads into your heart. The leads will be attached to your heart muscles and to the pulse generator. The leads will be tested to make sure that they work correctly. The pulse generator will be implanted under your skin, near your incision. The heart monitor will be watched to ensure that the pacemaker is working correctly. Your incision will be closed with stitches (sutures), skin glue, or adhesive tape. A bandage (dressing) will be placed over your incision. The procedure may vary among health care providers and hospitals. What happens after the procedure? Your blood pressure, heart rate, breathing rate, and blood oxygen level will be monitored until you leave the hospital or clinic. You may continue to receive fluids and medicines through an IV. You will be given pain medicine as needed. You will have a chest X-ray done. This is to make sure that your pacemaker is in the right place. You will be given a pacemaker identification card. This card lists the implant date, device model, and manufacturer of your pacemaker. If you were given a sedative during the procedure, it can affect you for several hours.  Do not drive or operate machinery until your health care provider says that it is safe. Summary A pacemaker is a small, battery-powered device that helps control the heartbeat. A biventricular pacemaker is used in people with heart failure to get the ventricles of the heart to pump more efficiently. Follow instructions from your health care provider about taking medicines and about eating and drinking before the procedure. You will be given a pacemaker identification card that lists the implant date, device  model, and manufacturer of your pacemaker. This information is not intended to replace advice given to you by your health care provider. Make sure you discuss any questions you have with your health care provider. Document Revised: 02/27/2021 Document Reviewed: 02/27/2021 Elsevier Patient Education  2024 [employer]    Pacemaker Implantation, Adult, Care After This sheet gives you information about how to care for yourself after your procedure. Your health care provider may also give you more specific instructions. If you have problems or questions, contact your health care provider. What can I expect after the procedure? After the procedure, it is common to have: Mild pain. Slight bruising. Some swelling over the incision. A slight bump over the skin where the device was placed. Sometimes, it is possible to feel the device under the skin. This is normal.  Follow these instructions at home: Medicines Take over-the-counter and prescription medicines only as told by your health care provider. If you were prescribed an antibiotic medicine, take it as told by your health care provider. Do not stop taking the antibiotic even if you start to feel better. Wound care Do not remove the bandage on your chest until directed to do so by your health care provider. After your bandage is removed, you may see pieces of tape called skin adhesive strips over the area where the cut was made (incision site). Let them fall off on their own. Check the incision site every day to make sure it is not infected, bleeding, or starting to pull apart. Do not use lotions or ointments near the incision site unless directed to do so. Keep the incision area clean and dry for 2-3 days after the procedure or as directed by your health care provider. It takes several weeks for the incision site to completely heal. Do not take baths, swim, or use a hot tub for 7-10 days or as otherwise directed by your health care  provider. Activity Do not drive or use heavy machinery while taking prescription pain medicine. Do not drive for 24 hours if you were given a medicine to help you relax (sedative). Check with your health care provider before you start to drive or play sports. Avoid sudden jerking, pulling, or chopping movements that pull your upper arm far away from your body. Avoid these movements for at least 6 weeks or as long as told by your health care provider. Do not lift your upper arm above your shoulders for at least 6 weeks or as long as told by your health care provider. This means no tennis, golf, or swimming. You may go back to work when your health care provider says it is okay. Pacemaker care You may be shown how to transfer data from your pacemaker through the phone to your health care provider. Always let all health care providers know about your pacemaker before you have any medical procedures or tests. Wear a medical ID bracelet or necklace stating that you have a pacemaker. Carry a pacemaker ID card with you at all  times. Your pacemaker battery will last for 5-15 years. Routine checks by your health care provider will let the health care provider know when the battery is starting to run down. The pacemaker will need to be replaced when the battery starts to run down. Do not use amateur Proofreader. Other electrical devices are safe to use, including power tools, lawn mowers, and speakers. If you are unsure of whether something is safe to use, ask your health care provider. When using your cell phone, hold it to the ear opposite the pacemaker. Do not leave your cell phone in a pocket over the pacemaker. Avoid places or objects that have a strong electric or magnetic field, including: Scientist, physiological. When at the airport, let officials know that you have a pacemaker. Power plants. Large electrical generators. Radiofrequency transmission towers, such as cell  phone and radio towers. General instructions Weigh yourself every day. If you suddenly gain weight, fluid may be building up in your body. Keep all follow-up visits as told by your health care provider. This is important. Contact a health care provider if: You gain weight suddenly. Your legs or feet swell. It feels like your heart is fluttering or skipping beats (heart palpitations). You have chills or a fever. You have more redness, swelling, or pain around your incisions. You have more fluid or blood coming from your incisions. Your incisions feel warm to the touch. You have pus or a bad smell coming from your incisions. Get help right away if: You have chest pain. You have trouble breathing or are short of breath. You become extremely tired. You are light-headed or you faint. This information is not intended to replace advice given to you by your health care provider. Make sure you discuss any questions you have with your health care provider. Document Released: 05/21/2005 Document Revised: 08/13/2016 Document Reviewed: 08/13/2016 Elsevier Interactive Patient Education  2018 [employer].    Supplemental Discharge Instructions for  Pacemaker/Defibrillator Patients  ACTIVITY No heavy lifting or vigorous activity with your left/right arm for 6 to 8 weeks.  Do not raise your left/right arm above your head for one week.  Gradually raise your affected arm as drawn below.           __  NO DRIVING for     ; you may begin driving on     .  WOUND CARE Keep the wound area clean and dry.  Do not get this area wet for one week. No showers for one week; you may shower on     . The tape/steri-strips on your wound will fall off; do not pull them off.  No bandage is needed on the site.  DO  NOT apply any creams, oils, or ointments to the wound area. If you notice any drainage or discharge from the wound, any swelling or bruising at the site, or you develop a fever > 101? F after you are  discharged home, call the office at once.  SPECIAL INSTRUCTIONS You are still able to use cellular telephones; use the ear opposite the side where you have your pacemaker/defibrillator.  Avoid carrying your cellular phone near your device. When traveling through airports, show security personnel your identification card to avoid being screened in the metal detectors.  Ask the security personnel to use the hand wand. Avoid arc welding equipment, MRI testing (magnetic resonance imaging), TENS units (transcutaneous nerve stimulators).  Call the office for questions about other devices. Avoid electrical  appliances that are in poor condition or are not properly grounded. Microwave ovens are safe to be near or to operate.  ADDITIONAL INFORMATION FOR DEFIBRILLATOR PATIENTS SHOULD YOUR DEVICE GO OFF: If your device goes off ONCE and you feel fine afterward, notify the device clinic nurses. If your device goes off ONCE and you do not feel well afterward, call 911. If your device goes off TWICE, call 911. If your device goes off THREE TIMES IN ONE DAY, call 911.  DO NOT DRIVE YOURSELF OR A FAMILY MEMBER WITH A DEFIBRILLATOR TO THE HOSPITAL--CALL 911.

## 2023-10-24 NOTE — Progress Notes (Signed)
Electrophysiology Office Note:   Date:  10/24/2023  ID:  Curtis Ramos, DOB Dec 12, 1931, MRN [id number]  Primary Cardiologist: [name], MD Electrophysiologist: Regan Lemming, MD      History of Present Illness:   Curtis Ramos is a 89 y.o. male with h/o chronic systolic heart failure, hypertension, hypothyroidism, atrial fibrillation seen today for routine electrophysiology followup.   Since last being seen in our clinic the patient reports doing overall well.  He has had significant recovery since his episode.  He is now able to ambulate without issue.  He does have some mild fatigue and some shortness of breath.  His son feels that he has retained some fluid..  he denies chest pain, palpitations, dyspnea, PND, orthopnea, nausea, vomiting, dizziness, syncope, edema, weight gain, or early satiety.   Review of systems complete and found to be negative unless listed in HPI.   EP Information / Studies Reviewed:    EKG is ordered today. Personal review as below.  EKG Interpretation Date/Time:  Monday October 24 2023 14:11:47 EST Ventricular Rate:  33 PR Interval:  302 QRS Duration:  170 QT Interval:  550 QTC Calculation: 407 R Axis:   -83  Text Interpretation: Sinus rhythm with 2nd degree A-V block with 2:1 A-V conduction Left axis deviation Left bundle branch block When compared with ECG of 14-Sep-2023 14:05, Premature ventricular complexes are no longer Present Sinus rhythm is now with 2nd degree A-V block Vent. rate has decreased BY  45 BPM Confirmed by Curtis Ramos ([id number]) on 10/24/2023 2:15:01 PM     Risk Assessment/Calculations:    CHA2DS2-VASc Score = 5   This indicates a 7.2% annual risk of stroke. The patient's score is based upon: CHF History: 1 HTN History: 1 Diabetes History: 0 Stroke History: 0 Vascular Disease History: 1 Age Score: 2 Gender Score: 0             Physical Exam:   VS:  BP (!) 124/52   Pulse (!) 33   Ht 5\' 10"  (1.778 m)   Wt  149 lb 1.9 oz (67.6 kg)   SpO2 97%   BMI 21.40 kg/m    Wt Readings from Last 3 Encounters:  10/24/23 149 lb 1.9 oz (67.6 kg)  09/14/23 146 lb 6.4 oz (66.4 kg)  06/20/23 136 lb (61.7 kg)     GEN: Well nourished, well developed in no acute distress NECK: No JVD; No carotid bruits CARDIAC: Bradycardic, regular, no murmurs, rubs, gallops RESPIRATORY:  Clear to auscultation without rales, wheezing or rhonchi  ABDOMEN: Soft, non-tender, non-distended EXTREMITIES:  No edema; No deformity   ASSESSMENT AND PLAN:    1.  Chronic systolic heart failure: Due to nonischemic cardiomyopathy, confirmed by cardiac MRI.  Has left bundle branch block.  Had an arrest due to PEA.  No obvious volume overload.  2.  Persistent atrial fibrillation: Currently on amiodarone.  Not anticoagulated due to GI bleeding.  3.  Secondary hypercoagulable state: Currently not anticoagulated due to GI bleed  4.  Second-degree 2-1 AV block: No reversible causes.  He is not on any AV nodal blockers.  He Curtis Ramos need pacemaker implant.  Curtis Ramos plan for CRT-P.  Risk and benefits have been discussed.  Risk of the bleeding, tamponade, infection, pneumothorax, lead dislodgment, MI, kidney failure, stroke.  He understands the risks and is agreed to the procedure.  Discussed with primary cardiology  Follow up with Dr. Elberta Fortis as usual post procedure  Signed, Curtis Arnesen [name], MD

## 2023-10-25 ENCOUNTER — Ambulatory Visit: Payer: Medicare Other

## 2023-10-30 DIAGNOSIS — R55 Syncope and collapse: Secondary | ICD-10-CM | POA: Diagnosis not present

## 2023-10-30 DIAGNOSIS — R42 Dizziness and giddiness: Secondary | ICD-10-CM | POA: Diagnosis not present

## 2023-10-30 DIAGNOSIS — I10 Essential (primary) hypertension: Secondary | ICD-10-CM | POA: Diagnosis not present

## 2023-10-30 DIAGNOSIS — I447 Left bundle-branch block, unspecified: Secondary | ICD-10-CM | POA: Diagnosis not present

## 2023-10-30 DIAGNOSIS — I4891 Unspecified atrial fibrillation: Secondary | ICD-10-CM | POA: Diagnosis not present

## 2023-10-30 DIAGNOSIS — R9431 Abnormal electrocardiogram [ECG] [EKG]: Secondary | ICD-10-CM | POA: Diagnosis not present

## 2023-10-30 DIAGNOSIS — R001 Bradycardia, unspecified: Secondary | ICD-10-CM | POA: Diagnosis not present

## 2023-10-30 DIAGNOSIS — I495 Sick sinus syndrome: Secondary | ICD-10-CM | POA: Diagnosis not present

## 2023-10-30 DIAGNOSIS — I444 Left anterior fascicular block: Secondary | ICD-10-CM | POA: Diagnosis not present

## 2023-10-30 DIAGNOSIS — I083 Combined rheumatic disorders of mitral, aortic and tricuspid valves: Secondary | ICD-10-CM | POA: Diagnosis not present

## 2023-10-30 DIAGNOSIS — I4439 Other atrioventricular block: Secondary | ICD-10-CM | POA: Diagnosis not present

## 2023-10-30 DIAGNOSIS — I442 Atrioventricular block, complete: Secondary | ICD-10-CM | POA: Diagnosis not present

## 2023-10-30 DIAGNOSIS — I441 Atrioventricular block, second degree: Secondary | ICD-10-CM | POA: Diagnosis not present

## 2023-10-30 DIAGNOSIS — I482 Chronic atrial fibrillation, unspecified: Secondary | ICD-10-CM | POA: Diagnosis not present

## 2023-10-30 DIAGNOSIS — R0602 Shortness of breath: Secondary | ICD-10-CM | POA: Diagnosis not present

## 2023-10-30 DIAGNOSIS — Z950 Presence of cardiac pacemaker: Secondary | ICD-10-CM | POA: Diagnosis not present

## 2023-10-30 DIAGNOSIS — I4430 Unspecified atrioventricular block: Secondary | ICD-10-CM | POA: Diagnosis not present

## 2023-10-30 DIAGNOSIS — I5022 Chronic systolic (congestive) heart failure: Secondary | ICD-10-CM | POA: Diagnosis not present

## 2023-10-30 DIAGNOSIS — I459 Conduction disorder, unspecified: Secondary | ICD-10-CM | POA: Diagnosis not present

## 2023-10-31 DIAGNOSIS — I5020 Unspecified systolic (congestive) heart failure: Secondary | ICD-10-CM | POA: Insufficient documentation

## 2023-10-31 DIAGNOSIS — I447 Left bundle-branch block, unspecified: Secondary | ICD-10-CM

## 2023-10-31 DIAGNOSIS — I441 Atrioventricular block, second degree: Secondary | ICD-10-CM

## 2023-10-31 HISTORY — DX: Left bundle-branch block, unspecified: I44.7

## 2023-10-31 HISTORY — DX: Atrioventricular block, second degree: I44.1

## 2023-10-31 HISTORY — DX: Unspecified systolic (congestive) heart failure: I50.20

## 2023-11-01 ENCOUNTER — Ambulatory Visit: Payer: Medicare Other

## 2023-11-02 DIAGNOSIS — Z7409 Other reduced mobility: Secondary | ICD-10-CM | POA: Insufficient documentation

## 2023-11-02 HISTORY — DX: Other reduced mobility: Z74.09

## 2023-11-03 DIAGNOSIS — I480 Paroxysmal atrial fibrillation: Secondary | ICD-10-CM | POA: Diagnosis not present

## 2023-11-03 DIAGNOSIS — R2689 Other abnormalities of gait and mobility: Secondary | ICD-10-CM | POA: Diagnosis not present

## 2023-11-04 DIAGNOSIS — D649 Anemia, unspecified: Secondary | ICD-10-CM | POA: Diagnosis not present

## 2023-11-04 DIAGNOSIS — I5022 Chronic systolic (congestive) heart failure: Secondary | ICD-10-CM | POA: Diagnosis not present

## 2023-11-04 DIAGNOSIS — J9611 Chronic respiratory failure with hypoxia: Secondary | ICD-10-CM | POA: Diagnosis not present

## 2023-11-04 DIAGNOSIS — I442 Atrioventricular block, complete: Secondary | ICD-10-CM | POA: Diagnosis not present

## 2023-11-07 SURGERY — Surgical Case
Anesthesia: *Unknown

## 2023-11-24 ENCOUNTER — Ambulatory Visit: Payer: Medicare Other | Attending: Cardiology

## 2023-11-24 DIAGNOSIS — R0609 Other forms of dyspnea: Secondary | ICD-10-CM | POA: Diagnosis not present

## 2023-11-24 LAB — ECHOCARDIOGRAM COMPLETE
Calc EF: 34.3 %
MV M vel: 3.63 m/s
MV Peak grad: 52.7 mm[Hg]
P 1/2 time: 606 ms
S' Lateral: 4.7 cm
Single Plane A2C EF: 42.3 %
Single Plane A4C EF: 23.7 %

## 2023-12-01 DIAGNOSIS — H353221 Exudative age-related macular degeneration, left eye, with active choroidal neovascularization: Secondary | ICD-10-CM | POA: Diagnosis not present

## 2023-12-08 ENCOUNTER — Ambulatory Visit (HOSPITAL_COMMUNITY): Admit: 2023-12-08 | Payer: Medicare Other | Admitting: Cardiology

## 2023-12-08 ENCOUNTER — Encounter (HOSPITAL_COMMUNITY): Payer: Self-pay

## 2023-12-08 SURGERY — BIV PACEMAKER INSERTION CRT-P

## 2023-12-09 ENCOUNTER — Telehealth: Payer: Self-pay

## 2023-12-09 NOTE — Telephone Encounter (Signed)
Patient notified through my chart.

## 2023-12-09 NOTE — Telephone Encounter (Signed)
-----   Message from [name] sent at 11/28/2023 11:00 AM EST ----- Echocardiogram shows still diminished ejection fraction 25 to 30%, everything else looks fine

## 2023-12-10 DIAGNOSIS — R001 Bradycardia, unspecified: Secondary | ICD-10-CM | POA: Diagnosis not present

## 2023-12-10 DIAGNOSIS — R5381 Other malaise: Secondary | ICD-10-CM | POA: Diagnosis not present

## 2023-12-10 DIAGNOSIS — D649 Anemia, unspecified: Secondary | ICD-10-CM | POA: Diagnosis not present

## 2023-12-10 DIAGNOSIS — E039 Hypothyroidism, unspecified: Secondary | ICD-10-CM | POA: Diagnosis not present

## 2023-12-10 DIAGNOSIS — M2550 Pain in unspecified joint: Secondary | ICD-10-CM | POA: Diagnosis not present

## 2023-12-10 DIAGNOSIS — I5023 Acute on chronic systolic (congestive) heart failure: Secondary | ICD-10-CM | POA: Diagnosis not present

## 2023-12-10 DIAGNOSIS — I1310 Hypertensive heart and chronic kidney disease without heart failure, with stage 1 through stage 4 chronic kidney disease, or unspecified chronic kidney disease: Secondary | ICD-10-CM | POA: Diagnosis not present

## 2023-12-10 DIAGNOSIS — I4891 Unspecified atrial fibrillation: Secondary | ICD-10-CM | POA: Diagnosis not present

## 2023-12-12 DIAGNOSIS — Z45018 Encounter for adjustment and management of other part of cardiac pacemaker: Secondary | ICD-10-CM | POA: Diagnosis not present

## 2023-12-12 DIAGNOSIS — I441 Atrioventricular block, second degree: Secondary | ICD-10-CM | POA: Diagnosis not present

## 2023-12-18 DIAGNOSIS — I441 Atrioventricular block, second degree: Secondary | ICD-10-CM | POA: Diagnosis not present

## 2023-12-18 DIAGNOSIS — Z45018 Encounter for adjustment and management of other part of cardiac pacemaker: Secondary | ICD-10-CM | POA: Diagnosis not present

## 2023-12-20 ENCOUNTER — Encounter: Payer: Self-pay | Admitting: Cardiology

## 2023-12-20 ENCOUNTER — Ambulatory Visit: Payer: Medicare Other | Attending: Cardiology | Admitting: Cardiology

## 2023-12-20 VITALS — BP 120/72 | HR 75 | Ht 71.0 in | Wt 158.0 lb

## 2023-12-20 DIAGNOSIS — I5022 Chronic systolic (congestive) heart failure: Secondary | ICD-10-CM

## 2023-12-20 DIAGNOSIS — I4819 Other persistent atrial fibrillation: Secondary | ICD-10-CM | POA: Diagnosis not present

## 2023-12-20 DIAGNOSIS — I480 Paroxysmal atrial fibrillation: Secondary | ICD-10-CM

## 2023-12-20 DIAGNOSIS — I447 Left bundle-branch block, unspecified: Secondary | ICD-10-CM

## 2023-12-20 DIAGNOSIS — R0609 Other forms of dyspnea: Secondary | ICD-10-CM | POA: Diagnosis not present

## 2023-12-20 NOTE — Progress Notes (Signed)
 Cardiology Office Note:    Date:  12/20/2023   ID:  Curtis Ramos, DOB 05-31-1932, MRN [id number]  PCP:  Stephanie Charlene CROME, MD  Cardiologist:  Lamar Fitch, MD    Referring MD: Stephanie Charlene CROME, MD   Chief Complaint  Patient presents with   Follow-up    History of Present Illness:    Curtis Ramos is a 89 y.o. male  past medical history significant for nonischemic cardiomyopathy last estimation in February 2024 by MRI with ejection fraction 70%, several severe left atrium enlargement, paroxysmal atrial fibrillation he was on anticoagulation however recently did been discontinued because of GI bleeding and fragility. Story getting any worse in February he had cardiac arrest required CPR he was down for about 9 minutes, 4 rounds of epi has been given after that he moved to nursing home  Since have seen him last time he ended up going to [facility] hospital with complete heart block, BiV pacemaker has been implanted.  He comes today to my office he looks terrific.  He is in sinus rhythm BiV paced.  Denies have any chest pain tightness squeezing pressure or chest started exercising started walking on the regular basis.  After admit he looks pretty good  Past Medical History:  Diagnosis Date   Acute on chronic combined systolic and diastolic CHF (congestive heart failure) (HCC) 03/19/2019   AKI (acute kidney injury) (HCC) 12/26/2014   Arthralgia 11/04/2014   Arthritis    proabably   Atypical mycobacterium infection    Cancer [facility])    family unaware of this hx on 12/26/2014   Chronic systolic heart failure (HCC)    Decreased oral intake 12/26/2014   Decreased oral intake 12/26/2014   Depressed left ventricular ejection fraction 02/05/2020   Dilated cardiomyopathy (HCC) 03/19/2019   Ejection fraction 20 to 25% based on echocardiogram done by pulmonologist month ago.   DJD (degenerative joint disease) 10/07/2014   Dyspnea on exertion 03/19/2019   Essential hypertension 02/05/2020    GERD (gastroesophageal reflux disease)    Hammer toes of both feet 01/30/2018   History of thyroid  cancer 12/16/2016   Hypercalcemia 12/26/2014   Hypothyroidism    Hypothyroidism associated with surgical procedure 10/07/2014   Lung mass    Myalgia 11/04/2014   Mycobacterium avium complex (HCC)    Nail dystrophy 01/30/2018   Occult malignancy (HCC)    PAF (paroxysmal atrial fibrillation) (HCC) 02/05/2020   Persistent atrial fibrillation (HCC) 03/19/2019   Chads 2 Vascor equals 3   PONV (postoperative nausea and vomiting)    Pre-ulcerative calluses 01/30/2018   Pseudoaneurysm following procedure (HCC) 04/16/2020   Sensorineural hearing loss (SNHL), bilateral 08/11/2018   Shingles    Toenail fungus 01/30/2018   Wears glasses     Past Surgical History:  Procedure Laterality Date   APPENDECTOMY     CATARACT EXTRACTION W/ INTRAOCULAR LENS  IMPLANT, BILATERAL Bilateral    COLONOSCOPY     INGUINAL HERNIA REPAIR Left    IR GASTROSTOMY TUBE MOD SED  02/09/2023   IR GASTROSTOMY TUBE REMOVAL  04/21/2023   LEFT HEART CATH AND CORONARY ANGIOGRAPHY N/A 03/11/2020   Procedure: LEFT HEART CATH AND CORONARY ANGIOGRAPHY;  Surgeon: Dann Candyce RAMAN, MD;  Location: [facility];  Service: Cardiovascular;  Laterality: N/A;   MASS EXCISION Right 08/27/2014   Procedure: EXCISION MASS RIGHT PALM/INDEX;  Surgeon: Franky Curia, MD;  Location: [facility];  Service: Orthopedics;  Laterality: Right;   THYROIDECTOMY  2003    Current Medications: Current Meds  Medication Sig   acetaminophen  (TYLENOL ) 325 MG tablet Take 650 mg by mouth every 6 (six) hours as needed for mild pain or moderate pain.   acetaminophen  (TYLENOL ) 500 MG tablet Take 2 tablets (1,000 mg total) by mouth every 6 (six) hours as needed for mild pain, headache or fever.   amiodarone  (PACERONE ) 200 MG tablet Take 200 mg by mouth daily.   DOCUSATE SODIUM  PO Take 1 tablet by mouth daily.   feeding supplement (ENSURE ENLIVE / ENSURE  PLUS) LIQD Take 237 mLs by mouth 2 (two) times daily between meals.   furosemide  (LASIX ) 40 MG tablet Take 40 mg by mouth 2 (two) times daily.   hydrocortisone  (ANUSOL -HC) 2.5 % rectal cream Place 1 Application rectally 4 (four) times daily as needed for hemorrhoids or anal itching.   ketoconazole (NIZORAL) 2 % shampoo Apply 1 Application topically every evening.   LACTOBACILLUS RHAMNOSUS, GG, PO Take 500 mg by mouth 2 (two) times daily.   lansoprazole (PREVACID) 30 MG capsule Take 30 mg by mouth daily at 12 noon.   levothyroxine  (SYNTHROID ) 150 MCG tablet Take 150 mcg by mouth daily before breakfast.   losartan  (COZAAR ) 25 MG tablet Take 12.5 mg by mouth daily.   Melatonin 3 MG TBDP Take 1 tablet by mouth at bedtime as needed (Sleep).   Multiple Vitamins-Minerals (PRESERVISION AREDS PO) Take 1 tablet by mouth daily.   traZODone (DESYREL) 50 MG tablet Take 50 mg by mouth at bedtime.   Water  For Irrigation, Sterile (FREE WATER ) SOLN Place 100 mLs into feeding tube 6 (six) times daily.     Allergies:   Patient has no known allergies.   Social History   Socioeconomic History   Marital status: Married    Spouse name: Not on file   Number of children: Not on file   Years of education: Not on file   Highest education level: Not on file  Occupational History   Not on file  Tobacco Use   Smoking status: Never   Smokeless tobacco: Never  Substance and Sexual Activity   Alcohol use: No   Drug use: No   Sexual activity: Not on file  Other Topics Concern   Not on file  Social History Narrative   Not on file   Social Drivers of Health   Financial Resource Strain: Low Risk  (01/18/2023)   Overall Financial Resource Strain (CARDIA)    Difficulty of Paying Living Expenses: Not hard at all  Food Insecurity: Low Risk  (10/31/2023)   Received from [facility]   Hunger Vital Sign    Worried About Running Out of Food in the Last Year: Never true    Ran Out of Food in the Last Year: Never  true  Transportation Needs: No Transportation Needs (10/31/2023)   Received from [employer]    In the past 12 months, has lack of reliable transportation kept you from medical appointments, meetings, work or from getting things needed for daily living? : No  Physical Activity: Not on file  Stress: Not on file  Social Connections: Not on file     Family History: The patient's family history includes Bradycardia in his father and paternal uncle; Cancer in his maternal aunt. ROS:   Please see the history of present illness.    All 14 point review of systems negative except as described per history of present illness  EKGs/Labs/Other Studies Reviewed:  EKG Interpretation Date/Time:  Tuesday December 20 2023 13:30:34 EST Ventricular Rate:  74 PR Interval:  146 QRS Duration:  156 QT Interval:  442 QTC Calculation: 490 R Axis:   -72  Text Interpretation: Normal sinus rhythm VAT pacing When compared with ECG of 24-Oct-2023 14:11, Sinus rhythm is no longer with 2nd degree A-V block Vent. rate has increased BY  41 BPM T wave inversion no longer evident in Inferior leads T wave inversion no longer evident in Anterior leads QT has lengthened Confirmed by Bernie Charleston [phone]) on 12/20/2023 1:37:09 PM    Recent Labs: 01/13/2023: B Natriuretic Peptide 1,073.1 02/11/2023: TSH 23.002 04/22/2023: ALT 12 06/14/2023: Magnesium 2.1; NT-Pro BNP 4,395 09/14/2023: BUN 33; Creatinine, Ser 1.37; Hemoglobin 14.1; Platelets 200; Potassium 4.4; Sodium 140  Recent Lipid Panel    Component Value Date/Time   TRIG 96 01/20/2023 0425   LDLDIRECT 80 11/20/2021 1523    Physical Exam:    VS:  BP 120/72 (BP Location: Right Arm, Patient Position: Sitting)   Pulse 75   Ht 5' 11 (1.803 m)   Wt 158 lb (71.7 kg)   SpO2 97%   BMI 22.04 kg/m     Wt Readings from Last 3 Encounters:  12/20/23 158 lb (71.7 kg)  10/24/23 149 lb 1.9 oz (67.6 kg)  09/14/23 146 lb 6.4 oz (66.4 kg)      GEN:  Well nourished, well developed in no acute distress HEENT: Normal NECK: No JVD; No carotid bruits LYMPHATICS: No lymphadenopathy CARDIAC: RRR, no murmurs, no rubs, no gallops RESPIRATORY:  Clear to auscultation without rales, wheezing or rhonchi  ABDOMEN: Soft, non-tender, non-distended MUSCULOSKELETAL:  No edema; No deformity  SKIN: Warm and dry LOWER EXTREMITIES: no swelling NEUROLOGIC:  Alert and oriented x 3 PSYCHIATRIC:  Normal affect   ASSESSMENT:    1. Persistent atrial fibrillation (HCC)   2. Chronic systolic CHF (congestive heart failure) (HCC)   3. LBBB (left bundle branch block)   4. PAF (paroxysmal atrial fibrillation) (HCC)   5. Dyspnea on exertion    PLAN:    In order of problems listed above:  Paroxysmal atrial fibrillation today in sinus rhythm continue anticoagulation, Chronic systolic congestive heart failure looks compensated on physical exam.  I will wait another month to 6 weeks and then repeat echocardiogram to recheck left ventricle ejection fraction. Left bundle branch block now he does have BiV pacer. Dyspnea exertion denies having any   Medication Adjustments/Labs and Tests Ordered: Current medicines are reviewed at length with the patient today.  Concerns regarding medicines are outlined above.  Orders Placed This Encounter  Procedures   EKG 12-Lead   ECHOCARDIOGRAM COMPLETE   Medication changes: No orders of the defined types were placed in this encounter.   Signed, [place] DOROTHA Bernie, MD, [facility] 12/20/2023 1:57 PM    [facility]

## 2023-12-20 NOTE — Patient Instructions (Signed)
Medication Instructions:  Your physician recommends that you continue on your current medications as directed. Please refer to the Current Medication list given to you today.  *If you need a refill on your cardiac medications before your next appointment, please call your pharmacy*   Lab Work: None Ordered If you have labs (blood work) drawn today and your tests are completely normal, you will receive your results only by: MyChart Message (if you have MyChart) OR A paper copy in the mail If you have any lab test that is abnormal or we need to change your treatment, we will call you to review the results.   Testing/Procedures: 6 weeks Your physician has requested that you have an echocardiogram. Echocardiography is a painless test that uses sound waves to create images of your heart. It provides your doctor with information about the size and shape of your heart and how well your heart's chambers and valves are working. This procedure takes approximately one hour. There are no restrictions for this procedure. Please do NOT wear cologne, perfume, aftershave, or lotions (deodorant is allowed). Please arrive 15 minutes prior to your appointment time.  Please note: We ask at that you not bring children with you during ultrasound (echo/ vascular) testing. Due to room size and safety concerns, children are not allowed in the ultrasound rooms during exams. Our front office staff cannot provide observation of children in our lobby area while testing is being conducted. An adult accompanying a patient to their appointment will only be allowed in the ultrasound room at the discretion of the ultrasound technician under special circumstances. We apologize for any inconvenience.    Follow-Up: At [facility], you and your health needs are our priority.  As part of our continuing mission to provide you with exceptional heart care, we have created designated Provider Care Teams.  These Care Teams include  your primary Cardiologist (physician) and Advanced Practice Providers (APPs -  Physician Assistants and Nurse Practitioners) who all work together to provide you with the care you need, when you need it.  We recommend signing up for the patient portal called "MyChart".  Sign up information is provided on this After Visit Summary.  MyChart is used to connect with patients for Virtual Visits (Telemedicine).  Patients are able to view lab/test results, encounter notes, upcoming appointments, etc.  Non-urgent messages can be sent to your provider as well.   To learn more about what you can do with MyChart, go to [email or web address].    Your next appointment:   8 week(s)  The format for your next appointment:   In Person  Provider:   [name], MD    Other Instructions NA

## 2023-12-21 DIAGNOSIS — H5210 Myopia, unspecified eye: Secondary | ICD-10-CM | POA: Diagnosis not present

## 2024-01-12 DIAGNOSIS — I5023 Acute on chronic systolic (congestive) heart failure: Secondary | ICD-10-CM | POA: Diagnosis not present

## 2024-01-12 DIAGNOSIS — E039 Hypothyroidism, unspecified: Secondary | ICD-10-CM | POA: Diagnosis not present

## 2024-01-31 ENCOUNTER — Other Ambulatory Visit: Payer: Medicare Other

## 2024-01-31 DIAGNOSIS — I469 Cardiac arrest, cause unspecified: Secondary | ICD-10-CM | POA: Diagnosis not present

## 2024-01-31 DIAGNOSIS — I428 Other cardiomyopathies: Secondary | ICD-10-CM | POA: Diagnosis not present

## 2024-01-31 DIAGNOSIS — I5020 Unspecified systolic (congestive) heart failure: Secondary | ICD-10-CM | POA: Diagnosis not present

## 2024-01-31 DIAGNOSIS — Z45018 Encounter for adjustment and management of other part of cardiac pacemaker: Secondary | ICD-10-CM | POA: Diagnosis not present

## 2024-01-31 DIAGNOSIS — Z950 Presence of cardiac pacemaker: Secondary | ICD-10-CM | POA: Diagnosis not present

## 2024-01-31 DIAGNOSIS — I441 Atrioventricular block, second degree: Secondary | ICD-10-CM | POA: Diagnosis not present

## 2024-01-31 DIAGNOSIS — I447 Left bundle-branch block, unspecified: Secondary | ICD-10-CM | POA: Diagnosis not present

## 2024-02-03 ENCOUNTER — Ambulatory Visit: Payer: Medicare Other | Attending: Cardiology

## 2024-02-03 ENCOUNTER — Telehealth: Payer: Self-pay

## 2024-02-03 DIAGNOSIS — I4819 Other persistent atrial fibrillation: Secondary | ICD-10-CM

## 2024-02-03 LAB — ECHOCARDIOGRAM COMPLETE
AR max vel: 1.56 cm2
AV Area VTI: 1.75 cm2
AV Area mean vel: 1.83 cm2
AV Mean grad: 3.5 mmHg
AV Peak grad: 8.9 mmHg
Ao pk vel: 1.49 m/s
Area-P 1/2: 2.08 cm2
S' Lateral: 5 cm

## 2024-02-03 NOTE — Telephone Encounter (Signed)
 Patient notified through my chart.

## 2024-02-03 NOTE — Telephone Encounter (Signed)
-----   Message from [name] sent at 02/03/2024 12:08 PM EDT ----- Echocardiogram showed ejection fraction 35 to 40% which is better than before, everything else looks stable

## 2024-02-10 DIAGNOSIS — K219 Gastro-esophageal reflux disease without esophagitis: Secondary | ICD-10-CM | POA: Diagnosis not present

## 2024-02-10 DIAGNOSIS — I5023 Acute on chronic systolic (congestive) heart failure: Secondary | ICD-10-CM | POA: Diagnosis not present

## 2024-02-10 DIAGNOSIS — I4891 Unspecified atrial fibrillation: Secondary | ICD-10-CM | POA: Diagnosis not present

## 2024-02-10 DIAGNOSIS — I119 Hypertensive heart disease without heart failure: Secondary | ICD-10-CM | POA: Diagnosis not present

## 2024-02-10 DIAGNOSIS — R001 Bradycardia, unspecified: Secondary | ICD-10-CM | POA: Diagnosis not present

## 2024-02-10 DIAGNOSIS — M2550 Pain in unspecified joint: Secondary | ICD-10-CM | POA: Diagnosis not present

## 2024-02-10 DIAGNOSIS — N1831 Chronic kidney disease, stage 3a: Secondary | ICD-10-CM | POA: Diagnosis not present

## 2024-02-11 DIAGNOSIS — I4891 Unspecified atrial fibrillation: Secondary | ICD-10-CM | POA: Diagnosis not present

## 2024-02-11 DIAGNOSIS — M2550 Pain in unspecified joint: Secondary | ICD-10-CM | POA: Diagnosis not present

## 2024-02-11 DIAGNOSIS — K219 Gastro-esophageal reflux disease without esophagitis: Secondary | ICD-10-CM | POA: Diagnosis not present

## 2024-02-11 DIAGNOSIS — N1831 Chronic kidney disease, stage 3a: Secondary | ICD-10-CM | POA: Diagnosis not present

## 2024-02-11 DIAGNOSIS — I119 Hypertensive heart disease without heart failure: Secondary | ICD-10-CM | POA: Diagnosis not present

## 2024-02-11 DIAGNOSIS — I5023 Acute on chronic systolic (congestive) heart failure: Secondary | ICD-10-CM | POA: Diagnosis not present

## 2024-02-11 DIAGNOSIS — R001 Bradycardia, unspecified: Secondary | ICD-10-CM | POA: Diagnosis not present

## 2024-02-20 ENCOUNTER — Encounter: Payer: Self-pay | Admitting: Cardiology

## 2024-02-20 ENCOUNTER — Ambulatory Visit: Payer: Medicare Other | Attending: Cardiology | Admitting: Cardiology

## 2024-02-20 ENCOUNTER — Ambulatory Visit: Admitting: Cardiology

## 2024-02-20 VITALS — BP 130/84 | HR 68 | Ht 71.0 in | Wt 160.6 lb

## 2024-02-20 VITALS — BP 138/72 | HR 107 | Ht 71.0 in | Wt 160.0 lb

## 2024-02-20 DIAGNOSIS — Z79899 Other long term (current) drug therapy: Secondary | ICD-10-CM

## 2024-02-20 DIAGNOSIS — I447 Left bundle-branch block, unspecified: Secondary | ICD-10-CM | POA: Diagnosis not present

## 2024-02-20 DIAGNOSIS — M533 Sacrococcygeal disorders, not elsewhere classified: Secondary | ICD-10-CM | POA: Diagnosis not present

## 2024-02-20 DIAGNOSIS — I441 Atrioventricular block, second degree: Secondary | ICD-10-CM | POA: Diagnosis not present

## 2024-02-20 DIAGNOSIS — I5022 Chronic systolic (congestive) heart failure: Secondary | ICD-10-CM

## 2024-02-20 DIAGNOSIS — I10 Essential (primary) hypertension: Secondary | ICD-10-CM | POA: Diagnosis not present

## 2024-02-20 DIAGNOSIS — M545 Low back pain, unspecified: Secondary | ICD-10-CM | POA: Diagnosis not present

## 2024-02-20 DIAGNOSIS — I4819 Other persistent atrial fibrillation: Secondary | ICD-10-CM

## 2024-02-20 DIAGNOSIS — I420 Dilated cardiomyopathy: Secondary | ICD-10-CM | POA: Diagnosis not present

## 2024-02-20 DIAGNOSIS — D6869 Other thrombophilia: Secondary | ICD-10-CM

## 2024-02-20 DIAGNOSIS — R0609 Other forms of dyspnea: Secondary | ICD-10-CM | POA: Diagnosis not present

## 2024-02-20 DIAGNOSIS — M25552 Pain in left hip: Secondary | ICD-10-CM | POA: Diagnosis not present

## 2024-02-20 LAB — CUP PACEART INCLINIC DEVICE CHECK
Battery Remaining Longevity: 135 mo
Battery Voltage: 3.09 V
Brady Statistic AP VP Percent: 46.04 %
Brady Statistic AP VS Percent: 0.25 %
Brady Statistic AS VP Percent: 52.43 %
Brady Statistic AS VS Percent: 1.28 %
Brady Statistic RA Percent Paced: 46.37 %
Brady Statistic RV Percent Paced: 98.47 %
Date Time Interrogation Session: 20250407113514
Implantable Lead Connection Status: 753985
Implantable Lead Connection Status: 753985
Implantable Lead Connection Status: 753985
Implantable Lead Implant Date: 20241216
Implantable Lead Implant Date: 20241216
Implantable Lead Implant Date: 20241216
Implantable Lead Location: 753858
Implantable Lead Location: 753859
Implantable Lead Location: 753860
Implantable Lead Model: 4598
Implantable Lead Model: 5076
Implantable Lead Model: 5076
Implantable Pulse Generator Implant Date: 20241216
Lead Channel Impedance Value: 1045 Ohm
Lead Channel Impedance Value: 342 Ohm
Lead Channel Impedance Value: 342 Ohm
Lead Channel Impedance Value: 418 Ohm
Lead Channel Impedance Value: 475 Ohm
Lead Channel Impedance Value: 475 Ohm
Lead Channel Impedance Value: 494 Ohm
Lead Channel Impedance Value: 532 Ohm
Lead Channel Impedance Value: 665 Ohm
Lead Channel Impedance Value: 684 Ohm
Lead Channel Impedance Value: 703 Ohm
Lead Channel Impedance Value: 817 Ohm
Lead Channel Impedance Value: 931 Ohm
Lead Channel Impedance Value: 988 Ohm
Lead Channel Pacing Threshold Amplitude: 0.75 V
Lead Channel Pacing Threshold Amplitude: 0.75 V
Lead Channel Pacing Threshold Amplitude: 1.25 V
Lead Channel Pacing Threshold Pulse Width: 0.4 ms
Lead Channel Pacing Threshold Pulse Width: 0.4 ms
Lead Channel Pacing Threshold Pulse Width: 0.4 ms
Lead Channel Sensing Intrinsic Amplitude: 1.375 mV
Lead Channel Sensing Intrinsic Amplitude: 1.75 mV
Lead Channel Sensing Intrinsic Amplitude: 13.875 mV
Lead Channel Sensing Intrinsic Amplitude: 15.625 mV
Lead Channel Setting Pacing Amplitude: 1.25 V
Lead Channel Setting Pacing Amplitude: 2 V
Lead Channel Setting Pacing Amplitude: 2 V
Lead Channel Setting Pacing Pulse Width: 0.4 ms
Lead Channel Setting Pacing Pulse Width: 0.4 ms
Lead Channel Setting Sensing Sensitivity: 2.8 mV
Zone Setting Status: 755011
Zone Setting Status: 755011

## 2024-02-20 NOTE — Progress Notes (Signed)
  Electrophysiology Office Note:   Date:  02/20/2024  ID:  Curtis Ramos, DOB Jan 18, 1932, MRN [id number]  Primary Cardiologist: [name], MD Primary Heart Failure: None Electrophysiologist: Karry Causer [name], MD      History of Present Illness:   Curtis Ramos is a 89 y.o. male with h/o chronic systolic heart failure, hypertension, hypothyroidism, atrial fibrillation seen today for routine electrophysiology followup.   He is now post Medtronic CRT-P implanted at [facility].  He presented with episodes of presyncope.  Since last being seen in our clinic the patient reports doing well.  He has no chest pain or shortness of breath.  He walks multiple times a day without issue other than hip pain.  He feels much improved since his pacemaker was implanted.  he denies chest pain, palpitations, dyspnea, PND, orthopnea, nausea, vomiting, dizziness, syncope, edema, weight gain, or early satiety.   Review of systems complete and found to be negative unless listed in HPI.      EP Information / Studies Reviewed:    EKG is ordered today. Personal review as below.  EKG Interpretation Date/Time:  Monday February 20 2024 10:25:17 EDT Ventricular Rate:  68 PR Interval:  146 QRS Duration:  158 QT Interval:  450 QTC Calculation: 478 R Axis:   -63  Text Interpretation: Normal sinus rhythm VENTRICULAR PACING Abnormal ECG When compared with ECG of 20-Dec-2023 13:30, No significant change was found Confirmed by An Schnabel ([id number]) on 02/20/2024 10:35:47 AM   PPM Interrogation-  reviewed in detail today,  See PACEART report.  Device History: Medtronic BiV PPM implanted 10/31/23 for Second Degree AV block  Risk Assessment/Calculations:    CHA2DS2-VASc Score = 5   This indicates a 7.2% annual risk of stroke. The patient's score is based upon: CHF History: 1 HTN History: 1 Diabetes History: 0 Stroke History: 0 Vascular Disease History: 1 Age Score: 2 Gender Score: 0              Physical Exam:   VS:  BP 130/84   Pulse 68   Ht 5\' 11"  (1.803 m)   Wt 160 lb 9.6 oz (72.8 kg)   SpO2 98%   BMI 22.40 kg/m    Wt Readings from Last 3 Encounters:  02/20/24 160 lb 9.6 oz (72.8 kg)  12/20/23 158 lb (71.7 kg)  10/24/23 149 lb 1.9 oz (67.6 kg)     GEN: Well nourished, well developed in no acute distress NECK: No JVD; No carotid bruits CARDIAC: Regular rate and rhythm, no murmurs, rubs, gallops RESPIRATORY:  Clear to auscultation without rales, wheezing or rhonchi  ABDOMEN: Soft, non-tender, non-distended EXTREMITIES:  No edema; No deformity   ASSESSMENT AND PLAN:    Second Degree AV block s/p Medtronic PPM  Normal PPM function See Pace Art report No changes today  2.  Chronic systolic heart failure: Due to nonischemic cardiomyopathy.  Currently on medical therapy per primary cardiology.  Tyrah Broers continue with current management.  3.  Persistent atrial fibrillation: Currently on amiodarone.  Remains in sinus rhythm.  4.  High risk medication monitoring: Currently on amiodarone for atrial fibrillation.  TSH and LFTs reviewed and within normal limits.  5.  Secondary hypercoagulable state: Not anticoagulated due to GI bleeding  Disposition:   Follow up with Dr. Elberta Fortis in 12 months  Signed, Aamirah Salmi [name], MD

## 2024-02-20 NOTE — Patient Instructions (Addendum)
Medication Instructions:  Your physician recommends that you continue on your current medications as directed. Please refer to the Current Medication list given to you today.  *If you need a refill on your cardiac medications before your next appointment, please call your pharmacy*   Lab Work: BMP- today If you have labs (blood work) drawn today and your tests are completely normal, you will receive your results only by: MyChart Message (if you have MyChart) OR A paper copy in the mail If you have any lab test that is abnormal or we need to change your treatment, we will call you to review the results.   Testing/Procedures: None Ordered   Follow-Up: At [facility], you and your health needs are our priority.  As part of our continuing mission to provide you with exceptional heart care, we have created designated Provider Care Teams.  These Care Teams include your primary Cardiologist (physician) and Advanced Practice Providers (APPs -  Physician Assistants and Nurse Practitioners) who all work together to provide you with the care you need, when you need it.  We recommend signing up for the patient portal called "MyChart".  Sign up information is provided on this After Visit Summary.  MyChart is used to connect with patients for Virtual Visits (Telemedicine).  Patients are able to view lab/test results, encounter notes, upcoming appointments, etc.  Non-urgent messages can be sent to your provider as well.   To learn more about what you can do with MyChart, go to [email or web address].    Your next appointment:   4 month(s)  The format for your next appointment:   In Person  Provider:   [name], MD    Other Instructions NA

## 2024-02-20 NOTE — Patient Instructions (Signed)
 Medication Instructions:  Your physician recommends that you continue on your current medications as directed. Please refer to the Current Medication list given to you today.  *If you need a refill on your cardiac medications before your next appointment, please call your pharmacy*   Lab Work: None ordered  If you have any lab test that is abnormal or we need to change your treatment, we will call you to review the results.   Testing/Procedures: None ordered   Follow-Up: At [facility], you and your health needs are our priority.  As part of our continuing mission to provide you with exceptional heart care, we have created designated Provider Care Teams.  These Care Teams include your primary Cardiologist (physician) and Advanced Practice Providers (APPs -  Physician Assistants and Nurse Practitioners) who all work together to provide you with the care you need, when you need it.  Your next appointment:   1 year(s)  The format for your next appointment:   In Person  Provider:   Loman Brooklyn, MD    Thank you for choosing [facility]   Dory Horn, RN [phone]

## 2024-02-20 NOTE — Progress Notes (Signed)
 Cardiology Office Note:    Date:  02/20/2024   ID:  Curtis Ramos, DOB 1932-01-15, MRN [id number]  PCP:  Ailene Ravel, MD  Cardiologist:  [name], MD    Referring MD: Ailene Ravel, MD   Chief Complaint  Patient presents with   Follow-up    History of Present Illness:    Curtis Ramos is a 89 y.o. male past medical history significant for cardiomyopathy which is nonischemic with severely reduced left ventricle ejection fraction, he was put on guideline directed medical therapy as well as he received dual-chamber pacing.  Since that time he provide left ventricle ejection fraction to 3540%.  Also paroxysmal atrial fibrillation, maintaining sinus rhythm.  Curtis Ramos is much more concerning for last February he had cardiac arrest required CPR was done for 9 minutes for an epi was given in spite of that he recovered completely he comes today to my office he is very happy satisfied and doing well denies have any chest pain tightness squeezing pressure burning chest.  He really looks good  Past Medical History:  Diagnosis Date   Acute on chronic combined systolic and diastolic CHF (congestive heart failure) (HCC) 03/19/2019   AKI (acute kidney injury) (HCC) 12/26/2014   Arthralgia 11/04/2014   Arthritis    "proabably"   Atypical mycobacterium infection    Cancer [facility])    family unaware of this hx on 12/26/2014   Chronic systolic heart failure (HCC)    Decreased oral intake 12/26/2014   Decreased oral intake 12/26/2014   Depressed left ventricular ejection fraction 02/05/2020   Dilated cardiomyopathy (HCC) 03/19/2019   Ejection fraction 20 to 25% based on echocardiogram done by pulmonologist month ago.   DJD (degenerative joint disease) 10/07/2014   Dyspnea on exertion 03/19/2019   Essential hypertension 02/05/2020   GERD (gastroesophageal reflux disease)    Hammer toes of both feet 01/30/2018   History of thyroid cancer 12/16/2016   Hypercalcemia 12/26/2014   Hypothyroidism     Hypothyroidism associated with surgical procedure 10/07/2014   Lung mass    Myalgia 11/04/2014   Mycobacterium avium complex (HCC)    Nail dystrophy 01/30/2018   Occult malignancy (HCC)    PAF (paroxysmal atrial fibrillation) (HCC) 02/05/2020   Persistent atrial fibrillation (HCC) 03/19/2019   Chads 2 Vascor equals 3   PONV (postoperative nausea and vomiting)    Pre-ulcerative calluses 01/30/2018   Pseudoaneurysm following procedure (HCC) 04/16/2020   Sensorineural hearing loss (SNHL), bilateral 08/11/2018   Shingles    Toenail fungus 01/30/2018   Wears glasses     Past Surgical History:  Procedure Laterality Date   APPENDECTOMY     CATARACT EXTRACTION W/ INTRAOCULAR LENS  IMPLANT, BILATERAL Bilateral    COLONOSCOPY     INGUINAL HERNIA REPAIR Left    IR GASTROSTOMY TUBE MOD SED  02/09/2023   IR GASTROSTOMY TUBE REMOVAL  04/21/2023   LEFT HEART CATH AND CORONARY ANGIOGRAPHY N/A 03/11/2020   Procedure: LEFT HEART CATH AND CORONARY ANGIOGRAPHY;  Surgeon: Corky Crafts, MD;  Location: [facility] INV[facility];  Service: Cardiovascular;  Laterality: N/A;   MASS EXCISION Right 08/27/2014   Procedure: EXCISION MASS RIGHT PALM/INDEX;  Surgeon: Betha Loa, MD;  Location: [facility];  Service: Orthopedics;  Laterality: Right;   THYROIDECTOMY  2003    Current Medications: Current Meds  Medication Sig   acetaminophen (TYLENOL) 325 MG tablet Take 650 mg by mouth every 6 (six) hours as needed for mild pain  or moderate pain.   acetaminophen (TYLENOL) 500 MG tablet Take 2 tablets (1,000 mg total) by mouth every 6 (six) hours as needed for mild pain, headache or fever.   amiodarone (PACERONE) 200 MG tablet Take 200 mg by mouth daily.   DOCUSATE SODIUM PO Take 1 tablet by mouth daily.   feeding supplement (ENSURE ENLIVE / ENSURE PLUS) LIQD Take 237 mLs by mouth 2 (two) times daily between meals.   furosemide (LASIX) 40 MG tablet Take 40 mg by mouth 2 (two) times daily.   hydrocortisone  (ANUSOL-HC) 2.5 % rectal cream Place 1 Application rectally 4 (four) times daily as needed for hemorrhoids or anal itching.   ketoconazole (NIZORAL) 2 % shampoo Apply 1 Application topically every evening.   LACTOBACILLUS RHAMNOSUS, GG, PO Take 500 mg by mouth 2 (two) times daily.   lansoprazole (PREVACID) 30 MG capsule Take 30 mg by mouth daily at 12 noon.   levothyroxine (SYNTHROID) 150 MCG tablet Take 150 mcg by mouth daily before breakfast.   losartan (COZAAR) 25 MG tablet Take 12.5 mg by mouth daily.   Melatonin 3 MG TBDP Take 1 tablet by mouth at bedtime as needed (Sleep).   Multiple Vitamins-Minerals (PRESERVISION AREDS PO) Take 1 tablet by mouth daily.   traZODone (DESYREL) 50 MG tablet Take 50 mg by mouth at bedtime.   Water For Irrigation, Sterile (FREE WATER) SOLN Place 100 mLs into feeding tube 6 (six) times daily.     Allergies:   Patient has no known allergies.   Social History   Socioeconomic History   Marital status: Married    Spouse name: Not on file   Number of children: Not on file   Years of education: Not on file   Highest education level: Not on file  Occupational History   Not on file  Tobacco Use   Smoking status: Never   Smokeless tobacco: Never  Substance and Sexual Activity   Alcohol use: No   Drug use: No   Sexual activity: Not on file  Other Topics Concern   Not on file  Social History Narrative   Not on file   Social Drivers of Health   Financial Resource Strain: Low Risk  (01/18/2023)   Overall Financial Resource Strain (CARDIA)    Difficulty of Paying Living Expenses: Not hard at all  Food Insecurity: Low Risk  (10/31/2023)   Received from [facility]   Hunger Vital Sign    Worried About Running Out of Food in the Last Year: Never true    Ran Out of Food in the Last Year: Never true  Transportation Needs: No Transportation Needs (10/31/2023)   Received from [employer]    In the past 12 months, has lack of reliable  transportation kept you from medical appointments, meetings, work or from getting things needed for daily living? : No  Physical Activity: Not on file  Stress: Not on file  Social Connections: Not on file     Family History: The patient's family history includes Bradycardia in his father and paternal uncle; Cancer in his maternal aunt. ROS:   Please see the history of present illness.    All 14 point review of systems negative except as described per history of present illness  EKGs/Labs/Other Studies Reviewed:         Recent Labs: 04/22/2023: ALT 12 06/14/2023: Magnesium 2.1; NT-Pro BNP 4,395 09/14/2023: BUN 33; Creatinine, Ser 1.37; Hemoglobin 14.1; Platelets 200; Potassium 4.4; Sodium 140  Recent Lipid Panel    Component Value Date/Time   TRIG 96 01/20/2023 0425   LDLDIRECT 80 11/20/2021 1523    Physical Exam:    VS:  BP 138/72 (BP Location: Right Arm, Patient Position: Sitting)   Pulse (!) 107   Ht 5\' 11"  (1.803 m)   Wt 160 lb (72.6 kg)   SpO2 100%   BMI 22.32 kg/m     Wt Readings from Last 3 Encounters:  02/20/24 160 lb (72.6 kg)  02/20/24 160 lb 9.6 oz (72.8 kg)  12/20/23 158 lb (71.7 kg)     GEN:  Well nourished, well developed in no acute distress HEENT: Normal NECK: No JVD; No carotid bruits LYMPHATICS: No lymphadenopathy CARDIAC: RRR, no murmurs, no rubs, no gallops RESPIRATORY:  Clear to auscultation without rales, wheezing or rhonchi  ABDOMEN: Soft, non-tender, non-distended MUSCULOSKELETAL:  No edema; No deformity  SKIN: Warm and dry LOWER EXTREMITIES: no swelling NEUROLOGIC:  Alert and oriented x 3 PSYCHIATRIC:  Normal affect   ASSESSMENT:    1. Essential hypertension   2. Dilated cardiomyopathy (HCC)   3. Chronic systolic CHF (congestive heart failure) (HCC)   4. LBBB (left bundle branch block)   5. Mobitz type 2 second degree atrioventricular block   6. Dyspnea on exertion    PLAN:    In order of problems listed  above:  Cardiomyopathy.  Ejection fraction 35 to 40%.  I will check Chem-7 Chem-7 today we will try to arrange to do ARB ACE inhibitor or preferably Entresto.  However at the same time he looks so good that I am almost reluctant to do any changes to make you feel worse but we will try to see. Chronic systolic congestive heart rate compensated. Left bundle branch block BiV pacing. Mobitz type II secondary AV block.  Pacemaker present. Dyspnea on exertion doing well   Medication Adjustments/Labs and Tests Ordered: Current medicines are reviewed at length with the patient today.  Concerns regarding medicines are outlined above.  Orders Placed This Encounter  Procedures   Basic metabolic panel with GFR   Medication changes: No orders of the defined types were placed in this encounter.   Signed, Georgeanna Lea, MD, [facility] 02/20/2024 11:18 AM    [facility]

## 2024-02-21 LAB — BASIC METABOLIC PANEL WITH GFR
BUN/Creatinine Ratio: 16 (ref 10–24)
BUN: 19 mg/dL (ref 8–27)
CO2: 22 mmol/L (ref 20–29)
Calcium: 11 mg/dL — ABNORMAL HIGH (ref 8.6–10.2)
Chloride: 102 mmol/L (ref 96–106)
Creatinine, Ser: 1.17 mg/dL (ref 0.76–1.27)
Glucose: 87 mg/dL (ref 70–99)
Potassium: 4.4 mmol/L (ref 3.5–5.2)
Sodium: 141 mmol/L (ref 134–144)
eGFR: 60 mL/min/{1.73_m2} (ref >59)

## 2024-02-24 ENCOUNTER — Telehealth: Payer: Self-pay

## 2024-02-24 NOTE — Telephone Encounter (Signed)
 Patient notified through my chart and LM informing pt of my chart message.

## 2024-02-24 NOTE — Telephone Encounter (Signed)
-----   Message from [name] sent at 02/24/2024 12:56 PM EDT ----- Chem-7 looks good, increase losartan to 25 mg daily or if he is already taking 25 make it 25 twice daily, Chem-7 next week

## 2024-02-27 DIAGNOSIS — L603 Nail dystrophy: Secondary | ICD-10-CM | POA: Diagnosis not present

## 2024-02-27 DIAGNOSIS — I739 Peripheral vascular disease, unspecified: Secondary | ICD-10-CM | POA: Diagnosis not present

## 2024-02-27 DIAGNOSIS — L602 Onychogryphosis: Secondary | ICD-10-CM | POA: Diagnosis not present

## 2024-02-28 ENCOUNTER — Telehealth: Payer: Self-pay

## 2024-02-28 DIAGNOSIS — Z79899 Other long term (current) drug therapy: Secondary | ICD-10-CM

## 2024-02-28 MED ORDER — LOSARTAN POTASSIUM 25 MG PO TABS: 25.0000 mg | ORAL_TABLET | Freq: Every day | ORAL | 3 refills | Status: AC

## 2024-02-28 NOTE — Telephone Encounter (Signed)
 LM to return my call.

## 2024-02-28 NOTE — Telephone Encounter (Signed)
 Disregard previous message: Spoke with Rickie Chars aware I contacted patient nurse at the nursing center the patient resides([facility]). I spoke with Marthann Slade, she confirms patient is on losartan 12.5mg  daily. All changes updated on file. Advised of med changes and lab request. All details faxed per Michelle's request. Fax#[phone].

## 2024-02-28 NOTE — Telephone Encounter (Signed)
 Spoke with Rickie Chars aware I contacted patient nurse at the nursing center the patient resides([facility]). I spoke with Marthann Slade, she confirms patient is on losartan  12.5mg  daily. Advised of med changes and lab request. All details faxed per Michelle's request. Fax#[phone]

## 2024-02-28 NOTE — Telephone Encounter (Signed)
-----   Message from [name] sent at 02/24/2024 12:56 PM EDT ----- Chem-7 looks good, increase losartan to 25 mg daily or if he is already taking 25 make it 25 twice daily, Chem-7 next week

## 2024-03-12 ENCOUNTER — Ambulatory Visit (INDEPENDENT_AMBULATORY_CARE_PROVIDER_SITE_OTHER)

## 2024-03-12 DIAGNOSIS — R001 Bradycardia, unspecified: Secondary | ICD-10-CM | POA: Diagnosis not present

## 2024-03-12 DIAGNOSIS — M2550 Pain in unspecified joint: Secondary | ICD-10-CM | POA: Diagnosis not present

## 2024-03-12 DIAGNOSIS — E039 Hypothyroidism, unspecified: Secondary | ICD-10-CM | POA: Diagnosis not present

## 2024-03-12 DIAGNOSIS — I119 Hypertensive heart disease without heart failure: Secondary | ICD-10-CM | POA: Diagnosis not present

## 2024-03-12 DIAGNOSIS — K219 Gastro-esophageal reflux disease without esophagitis: Secondary | ICD-10-CM | POA: Diagnosis not present

## 2024-03-12 DIAGNOSIS — I5023 Acute on chronic systolic (congestive) heart failure: Secondary | ICD-10-CM | POA: Diagnosis not present

## 2024-03-12 DIAGNOSIS — K5900 Constipation, unspecified: Secondary | ICD-10-CM | POA: Diagnosis not present

## 2024-03-12 DIAGNOSIS — R5381 Other malaise: Secondary | ICD-10-CM | POA: Diagnosis not present

## 2024-03-12 DIAGNOSIS — I441 Atrioventricular block, second degree: Secondary | ICD-10-CM

## 2024-03-12 DIAGNOSIS — E441 Mild protein-calorie malnutrition: Secondary | ICD-10-CM | POA: Diagnosis not present

## 2024-03-12 DIAGNOSIS — I4819 Other persistent atrial fibrillation: Secondary | ICD-10-CM

## 2024-03-12 DIAGNOSIS — I4891 Unspecified atrial fibrillation: Secondary | ICD-10-CM | POA: Diagnosis not present

## 2024-03-12 DIAGNOSIS — N1831 Chronic kidney disease, stage 3a: Secondary | ICD-10-CM | POA: Diagnosis not present

## 2024-03-12 LAB — CUP PACEART REMOTE DEVICE CHECK
Battery Remaining Longevity: 130 mo
Battery Voltage: 3.08 V
Brady Statistic AP VP Percent: 30.03 %
Brady Statistic AP VS Percent: 0.24 %
Brady Statistic AS VP Percent: 67.54 %
Brady Statistic AS VS Percent: 2.19 %
Brady Statistic RA Percent Paced: 30.43 %
Brady Statistic RV Percent Paced: 97.56 %
Date Time Interrogation Session: 20250428044220
Implantable Lead Connection Status: 753985
Implantable Lead Connection Status: 753985
Implantable Lead Connection Status: 753985
Implantable Lead Implant Date: 20241216
Implantable Lead Implant Date: 20241216
Implantable Lead Implant Date: 20241216
Implantable Lead Location: 753858
Implantable Lead Location: 753859
Implantable Lead Location: 753860
Implantable Lead Model: 4598
Implantable Lead Model: 5076
Implantable Lead Model: 5076
Implantable Pulse Generator Implant Date: 20241216
Lead Channel Impedance Value: 342 Ohm
Lead Channel Impedance Value: 399 Ohm
Lead Channel Impedance Value: 418 Ohm
Lead Channel Impedance Value: 456 Ohm
Lead Channel Impedance Value: 475 Ohm
Lead Channel Impedance Value: 532 Ohm
Lead Channel Impedance Value: 551 Ohm
Lead Channel Impedance Value: 570 Ohm
Lead Channel Impedance Value: 589 Ohm
Lead Channel Impedance Value: 760 Ohm
Lead Channel Impedance Value: 760 Ohm
Lead Channel Impedance Value: 836 Ohm
Lead Channel Impedance Value: 874 Ohm
Lead Channel Impedance Value: 912 Ohm
Lead Channel Pacing Threshold Amplitude: 0.625 V
Lead Channel Pacing Threshold Amplitude: 0.875 V
Lead Channel Pacing Threshold Amplitude: 1.125 V
Lead Channel Pacing Threshold Pulse Width: 0.4 ms
Lead Channel Pacing Threshold Pulse Width: 0.4 ms
Lead Channel Pacing Threshold Pulse Width: 0.4 ms
Lead Channel Sensing Intrinsic Amplitude: 1.375 mV
Lead Channel Sensing Intrinsic Amplitude: 1.375 mV
Lead Channel Sensing Intrinsic Amplitude: 14.75 mV
Lead Channel Sensing Intrinsic Amplitude: 14.75 mV
Lead Channel Setting Pacing Amplitude: 1.5 V
Lead Channel Setting Pacing Amplitude: 1.75 V
Lead Channel Setting Pacing Amplitude: 2 V
Lead Channel Setting Pacing Pulse Width: 0.4 ms
Lead Channel Setting Pacing Pulse Width: 0.4 ms
Lead Channel Setting Sensing Sensitivity: 2.8 mV
Zone Setting Status: 755011
Zone Setting Status: 755011

## 2024-03-29 DIAGNOSIS — H353221 Exudative age-related macular degeneration, left eye, with active choroidal neovascularization: Secondary | ICD-10-CM | POA: Diagnosis not present

## 2024-04-16 DIAGNOSIS — I5023 Acute on chronic systolic (congestive) heart failure: Secondary | ICD-10-CM | POA: Diagnosis not present

## 2024-04-16 DIAGNOSIS — E441 Mild protein-calorie malnutrition: Secondary | ICD-10-CM | POA: Diagnosis not present

## 2024-05-03 NOTE — Progress Notes (Signed)
 Remote pacemaker transmission.

## 2024-05-14 DIAGNOSIS — I5023 Acute on chronic systolic (congestive) heart failure: Secondary | ICD-10-CM | POA: Diagnosis not present

## 2024-05-14 DIAGNOSIS — R001 Bradycardia, unspecified: Secondary | ICD-10-CM | POA: Diagnosis not present

## 2024-05-14 DIAGNOSIS — I4891 Unspecified atrial fibrillation: Secondary | ICD-10-CM | POA: Diagnosis not present

## 2024-05-14 DIAGNOSIS — E441 Mild protein-calorie malnutrition: Secondary | ICD-10-CM | POA: Diagnosis not present

## 2024-06-11 ENCOUNTER — Ambulatory Visit (INDEPENDENT_AMBULATORY_CARE_PROVIDER_SITE_OTHER)

## 2024-06-11 DIAGNOSIS — I441 Atrioventricular block, second degree: Secondary | ICD-10-CM | POA: Diagnosis not present

## 2024-06-12 DIAGNOSIS — E441 Mild protein-calorie malnutrition: Secondary | ICD-10-CM | POA: Diagnosis not present

## 2024-06-12 DIAGNOSIS — I5023 Acute on chronic systolic (congestive) heart failure: Secondary | ICD-10-CM | POA: Diagnosis not present

## 2024-06-12 DIAGNOSIS — M2550 Pain in unspecified joint: Secondary | ICD-10-CM | POA: Diagnosis not present

## 2024-06-12 LAB — CUP PACEART REMOTE DEVICE CHECK
Battery Remaining Longevity: 132 mo
Battery Voltage: 3.04 V
Brady Statistic AP VP Percent: 40.12 %
Brady Statistic AP VS Percent: 0.15 %
Brady Statistic AS VP Percent: 57.46 %
Brady Statistic AS VS Percent: 2.27 %
Brady Statistic RA Percent Paced: 40.76 %
Brady Statistic RV Percent Paced: 97.58 %
Date Time Interrogation Session: 20250728000723
Implantable Lead Connection Status: 753985
Implantable Lead Connection Status: 753985
Implantable Lead Connection Status: 753985
Implantable Lead Implant Date: 20241216
Implantable Lead Implant Date: 20241216
Implantable Lead Implant Date: 20241216
Implantable Lead Location: 753858
Implantable Lead Location: 753859
Implantable Lead Location: 753860
Implantable Lead Model: 4598
Implantable Lead Model: 5076
Implantable Lead Model: 5076
Implantable Pulse Generator Implant Date: 20241216
Lead Channel Impedance Value: 323 Ohm
Lead Channel Impedance Value: 323 Ohm
Lead Channel Impedance Value: 380 Ohm
Lead Channel Impedance Value: 399 Ohm
Lead Channel Impedance Value: 418 Ohm
Lead Channel Impedance Value: 418 Ohm
Lead Channel Impedance Value: 437 Ohm
Lead Channel Impedance Value: 589 Ohm
Lead Channel Impedance Value: 589 Ohm
Lead Channel Impedance Value: 608 Ohm
Lead Channel Impedance Value: 627 Ohm
Lead Channel Impedance Value: 646 Ohm
Lead Channel Impedance Value: 684 Ohm
Lead Channel Impedance Value: 703 Ohm
Lead Channel Pacing Threshold Amplitude: 0.75 V
Lead Channel Pacing Threshold Amplitude: 0.875 V
Lead Channel Pacing Threshold Amplitude: 1 V
Lead Channel Pacing Threshold Pulse Width: 0.4 ms
Lead Channel Pacing Threshold Pulse Width: 0.4 ms
Lead Channel Pacing Threshold Pulse Width: 0.4 ms
Lead Channel Sensing Intrinsic Amplitude: 1.125 mV
Lead Channel Sensing Intrinsic Amplitude: 1.125 mV
Lead Channel Sensing Intrinsic Amplitude: 11.375 mV
Lead Channel Sensing Intrinsic Amplitude: 11.375 mV
Lead Channel Setting Pacing Amplitude: 1.25 V
Lead Channel Setting Pacing Amplitude: 1.5 V
Lead Channel Setting Pacing Amplitude: 2 V
Lead Channel Setting Pacing Pulse Width: 0.4 ms
Lead Channel Setting Pacing Pulse Width: 0.4 ms
Lead Channel Setting Sensing Sensitivity: 2.8 mV
Zone Setting Status: 755011
Zone Setting Status: 755011

## 2024-06-13 ENCOUNTER — Ambulatory Visit: Payer: Self-pay | Admitting: Cardiology

## 2024-06-18 ENCOUNTER — Ambulatory Visit: Admitting: Cardiology

## 2024-06-19 DIAGNOSIS — E039 Hypothyroidism, unspecified: Secondary | ICD-10-CM | POA: Diagnosis not present

## 2024-06-19 DIAGNOSIS — I10 Essential (primary) hypertension: Secondary | ICD-10-CM | POA: Diagnosis not present

## 2024-06-19 DIAGNOSIS — D649 Anemia, unspecified: Secondary | ICD-10-CM | POA: Diagnosis not present

## 2024-06-19 DIAGNOSIS — I455 Other specified heart block: Secondary | ICD-10-CM | POA: Diagnosis not present

## 2024-06-19 DIAGNOSIS — I5022 Chronic systolic (congestive) heart failure: Secondary | ICD-10-CM | POA: Diagnosis not present

## 2024-06-27 DIAGNOSIS — Z1589 Genetic susceptibility to other disease: Secondary | ICD-10-CM | POA: Insufficient documentation

## 2024-06-27 DIAGNOSIS — M6530 Trigger finger, unspecified finger: Secondary | ICD-10-CM | POA: Insufficient documentation

## 2024-06-27 DIAGNOSIS — M79609 Pain in unspecified limb: Secondary | ICD-10-CM | POA: Insufficient documentation

## 2024-06-27 DIAGNOSIS — M79643 Pain in unspecified hand: Secondary | ICD-10-CM | POA: Insufficient documentation

## 2024-06-28 ENCOUNTER — Encounter: Payer: Self-pay | Admitting: Cardiology

## 2024-06-28 ENCOUNTER — Ambulatory Visit: Attending: Cardiology | Admitting: Cardiology

## 2024-06-28 VITALS — BP 98/60 | HR 73 | Ht 71.0 in | Wt 157.8 lb

## 2024-06-28 DIAGNOSIS — I4821 Permanent atrial fibrillation: Secondary | ICD-10-CM | POA: Diagnosis not present

## 2024-06-28 DIAGNOSIS — I441 Atrioventricular block, second degree: Secondary | ICD-10-CM

## 2024-06-28 DIAGNOSIS — I4819 Other persistent atrial fibrillation: Secondary | ICD-10-CM

## 2024-06-28 NOTE — Patient Instructions (Signed)
 Medication Instructions:  Your physician recommends that you continue on your current medications as directed. Please refer to the Current Medication list given to you today.  *If you need a refill on your cardiac medications before your next appointment, please call your pharmacy*   Lab Work: None Ordered If you have labs (blood work) drawn today and your tests are completely normal, you will receive your results only by: MyChart Message (if you have MyChart) OR A paper copy in the mail If you have any lab test that is abnormal or we need to change your treatment, we will call you to review the results.   Testing/Procedures: September Your physician has requested that you have an echocardiogram. Echocardiography is a painless test that uses sound waves to create images of your heart. It provides your doctor with information about the size and shape of your heart and how well your heart's chambers and valves are working. This procedure takes approximately one hour. There are no restrictions for this procedure. Please do NOT wear cologne, perfume, aftershave, or lotions (deodorant is allowed). Please arrive 15 minutes prior to your appointment time.  Please note: We ask at that you not bring children with you during ultrasound (echo/ vascular) testing. Due to room size and safety concerns, children are not allowed in the ultrasound rooms during exams. Our front office staff cannot provide observation of children in our lobby area while testing is being conducted. An adult accompanying a patient to their appointment will only be allowed in the ultrasound room at the discretion of the ultrasound technician under special circumstances. We apologize for any inconvenience.    Follow-Up: At [facility], you and your health needs are our priority.  As part of our continuing mission to provide you with exceptional heart care, we have created designated Provider Care Teams.  These Care Teams include  your primary Cardiologist (physician) and Advanced Practice Providers (APPs -  Physician Assistants and Nurse Practitioners) who all work together to provide you with the care you need, when you need it.  We recommend signing up for the patient portal called MyChart.  Sign up information is provided on this After Visit Summary.  MyChart is used to connect with patients for Virtual Visits (Telemedicine).  Patients are able to view lab/test results, encounter notes, upcoming appointments, etc.  Non-urgent messages can be sent to your provider as well.   To learn more about what you can do with MyChart, go to [email or web address].    Your next appointment:   4 month(s)  The format for your next appointment:   In Person  Provider:   Lamar Fitch, MD    Other Instructions NA

## 2024-06-28 NOTE — Progress Notes (Unsigned)
 Cardiology Office Note:    Date:  06/28/2024   ID:  Curtis Ramos, DOB 1932-09-05, MRN [id number]  PCP:  Stephanie Charlene CROME, MD  Cardiologist:  Lamar Fitch, MD    Referring MD: Stephanie Charlene CROME, MD   No chief complaint on file.   History of Present Illness:    Curtis Ramos is a 89 y.o. male with complex past medical history.  I met him years ago when he came with atrial fibrillation with diminished ejection fraction converted to sinus rhythm.  Ejection fraction normalized, after that he ended up having again with diminished left ventricular ejection fraction in February last year he had up having cardiac arrest CPR he was down for about 9 minutes 4 rounds of epi but still 5 everything end up having ICD, he comes today to months for follow-up he lives in nursing home he looks good and feels well.  Denies have any chest pain tightness squeezing pressure burning chest no discharge from the defibrillator really looking good  Past Medical History:  Diagnosis Date   Acute on chronic combined systolic and diastolic CHF (congestive heart failure) (HCC) 03/19/2019   Acute respiratory failure with hypoxia (HCC) 01/13/2023   Acute urinary retention 01/27/2023   AKI (acute kidney injury) (HCC) 12/26/2014   Arthralgia 11/04/2014   Arthritis    proabably   Atypical mycobacterium infection    Cancer [facility])    family unaware of this hx on 12/26/2014   Cardiac arrest (HCC) 01/13/2023   Chronic systolic heart failure (HCC)    Decreased oral intake 12/26/2014   Decreased oral intake 12/26/2014   Delirium 01/26/2023   Depressed left ventricular ejection fraction 02/05/2020   Dilated cardiomyopathy (HCC) 03/19/2019   Ejection fraction 20 to 25% based on echocardiogram done by pulmonologist month ago.   DJD (degenerative joint disease) 10/07/2014   Dyspnea on exertion 03/19/2019   Elevated liver enzymes 01/27/2023   Essential hypertension 02/05/2020   GERD (gastroesophageal reflux disease)     Hammer toes of both feet 01/30/2018   Hand pain 06/27/2024   Hematochezia 01/26/2023   HFrEF (heart failure with reduced ejection fraction) (HCC) 10/31/2023   History of thyroid  cancer 12/16/2016   HLA B27 (HLA B27 positive) 06/27/2024   Hypercalcemia 12/26/2014   Hypothyroidism    Hypothyroidism associated with surgical procedure 10/07/2014   Impaired functional mobility, balance, gait, and endurance 11/02/2023   LBBB (left bundle branch block) 10/31/2023   Lung mass    Mobitz type 2 second degree atrioventricular block 10/31/2023   Myalgia 11/04/2014   Mycobacterium avium complex (HCC)    Nail dystrophy 01/30/2018   Occult malignancy (HCC)    Oropharyngeal dysphagia 01/27/2023   PAF (paroxysmal atrial fibrillation) (HCC) 02/05/2020   Pain in limb 06/27/2024   Permanent atrial fibrillation (HCC) 10/15/2020   Persistent atrial fibrillation (HCC) 03/19/2019   Chads 2 Vascor equals 3   Polyarthralgia 11/04/2014   PONV (postoperative nausea and vomiting)    Pre-ulcerative calluses 01/30/2018   Protein-calorie malnutrition, severe 01/14/2023   Pseudoaneurysm following procedure (HCC) 04/16/2020   Sensorineural hearing loss (SNHL), bilateral 08/11/2018   Septic shock (HCC) 01/13/2023   Shingles    Shoulder joint pain 11/04/2014   Toenail fungus 01/30/2018   Trigger finger of right hand 06/27/2024   VAP (ventilator-associated pneumonia) (HCC) 01/26/2023   Wears glasses     Past Surgical History:  Procedure Laterality Date   APPENDECTOMY     CATARACT EXTRACTION W/ INTRAOCULAR LENS  IMPLANT, BILATERAL  Bilateral    COLONOSCOPY     INGUINAL HERNIA REPAIR Left    IR GASTROSTOMY TUBE MOD SED  02/09/2023   IR GASTROSTOMY TUBE REMOVAL  04/21/2023   LEFT HEART CATH AND CORONARY ANGIOGRAPHY N/A 03/11/2020   Procedure: LEFT HEART CATH AND CORONARY ANGIOGRAPHY;  Surgeon: Dann Candyce RAMAN, MD;  Location: [facility] INVASIVE CV LAB;  Service: Cardiovascular;  Laterality: N/A;   MASS EXCISION  Right 08/27/2014   Procedure: EXCISION MASS RIGHT PALM/INDEX;  Surgeon: Franky Curia, MD;  Location: [facility];  Service: Orthopedics;  Laterality: Right;   THYROIDECTOMY  2003    Current Medications: Current Meds  Medication Sig   acetaminophen  (TYLENOL ) 325 MG tablet Take 650 mg by mouth every 6 (six) hours as needed for mild pain or moderate pain.   acetaminophen  (TYLENOL ) 500 MG tablet Take 2 tablets (1,000 mg total) by mouth every 6 (six) hours as needed for mild pain, headache or fever.   amiodarone  (PACERONE ) 200 MG tablet Take 200 mg by mouth daily.   DOCUSATE SODIUM  PO Take 1 tablet by mouth daily.   feeding supplement (ENSURE ENLIVE / ENSURE PLUS) LIQD Take 237 mLs by mouth 2 (two) times daily between meals.   furosemide  (LASIX ) 40 MG tablet Take 40 mg by mouth 2 (two) times daily.   hydrocortisone  (ANUSOL -HC) 2.5 % rectal cream Place 1 Application rectally 4 (four) times daily as needed for hemorrhoids or anal itching.   ketoconazole (NIZORAL) 2 % shampoo Apply 1 Application topically every evening.   LACTOBACILLUS RHAMNOSUS, GG, PO Take 500 mg by mouth 2 (two) times daily.   lansoprazole (PREVACID) 30 MG capsule Take 30 mg by mouth daily at 12 noon.   levothyroxine  (SYNTHROID ) 150 MCG tablet Take 150 mcg by mouth daily before breakfast.   losartan  (COZAAR ) 25 MG tablet Take 1 tablet (25 mg total) by mouth daily. [facility] Spoke Rosaline RN [phone]   Melatonin 3 MG TBDP Take 1 tablet by mouth at bedtime as needed (Sleep).   Multiple Vitamins-Minerals (PRESERVISION AREDS PO) Take 1 tablet by mouth daily.   traZODone (DESYREL) 50 MG tablet Take 50 mg by mouth at bedtime.   Water  For Irrigation, Sterile (FREE WATER ) SOLN Place 100 mLs into feeding tube 6 (six) times daily.     Allergies:   Patient has no known allergies.   Social History   Socioeconomic History   Marital status: Married    Spouse name: Not on file   Number of children: Not on  file   Years of education: Not on file   Highest education level: Not on file  Occupational History   Not on file  Tobacco Use   Smoking status: Never   Smokeless tobacco: Never  Substance and Sexual Activity   Alcohol use: No   Drug use: No   Sexual activity: Not on file  Other Topics Concern   Not on file  Social History Narrative   Not on file   Social Drivers of Health   Financial Resource Strain: Low Risk  (01/18/2023)   Overall Financial Resource Strain (CARDIA)    Difficulty of Paying Living Expenses: Not hard at all  Food Insecurity: Low Risk  (10/31/2023)   Received from [facility]   Hunger Vital Sign    Within the past 12 months, you worried that your food would run out before you got money to buy more: Never true    Within the past 12 months, the food you  bought just didn't last and you didn't have money to get more. : Never true  Transportation Needs: No Transportation Needs (10/31/2023)   Received from [employer]    In the past 12 months, has lack of reliable transportation kept you from medical appointments, meetings, work or from getting things needed for daily living? : No  Physical Activity: Not on file  Stress: Not on file  Social Connections: Not on file     Family History: The patient's family history includes Bradycardia in his father and paternal uncle; Cancer in his maternal aunt. ROS:   Please see the history of present illness.    All 14 point review of systems negative except as described per history of present illness  EKGs/Labs/Other Studies Reviewed:    EKG Interpretation Date/Time:  Thursday June 28 2024 08:53:54 EDT Ventricular Rate:  73 PR Interval:  150 QRS Duration:  158 QT Interval:  450 QTC Calculation: 495 R Axis:   -64  Text Interpretation: Sinus rhythm with occasional Premature ventricular complexes Left axis deviation Right bundle branch block Possible Anterolateral infarct , age undetermined Abnormal  ECG When compared with ECG of 20-Feb-2024 10:25, Premature ventricular complexes are now Present Right bundle branch block has replaced Non-specific intra-ventricular conduction block Borderline criteria for Anterolateral infarct are now Present Confirmed by Bernie Charleston [phone]) on 06/28/2024 9:11:03 AM    Recent Labs: 09/14/2023: Hemoglobin 14.1; Platelets 200 02/20/2024: BUN 19; Creatinine, Ser 1.17; Potassium 4.4; Sodium 141  Recent Lipid Panel    Component Value Date/Time   TRIG 96 01/20/2023 0425   LDLDIRECT 80 11/20/2021 1523    Physical Exam:    VS:  BP 98/60   Pulse 73   Ht 5' 11 (1.803 m)   Wt 157 lb 12.8 oz (71.6 kg)   SpO2 96%   BMI 22.01 kg/m     Wt Readings from Last 3 Encounters:  06/28/24 157 lb 12.8 oz (71.6 kg)  02/20/24 160 lb (72.6 kg)  02/20/24 160 lb 9.6 oz (72.8 kg)     GEN:  Well nourished, well developed in no acute distress HEENT: Normal NECK: No JVD; No carotid bruits LYMPHATICS: No lymphadenopathy CARDIAC: RRR, no murmurs, no rubs, no gallops RESPIRATORY:  Clear to auscultation without rales, wheezing or rhonchi  ABDOMEN: Soft, non-tender, non-distended MUSCULOSKELETAL:  No edema; No deformity  SKIN: Warm and dry LOWER EXTREMITIES: no swelling NEUROLOGIC:  Alert and oriented x 3 PSYCHIATRIC:  Normal affect   ASSESSMENT:    1. Persistent atrial fibrillation (HCC)   2. Mobitz type 2 second degree atrioventricular block   3. Permanent atrial fibrillation (HCC)    PLAN:    In order of problems listed above:  Cardiomyopathy which is nonischemic.  Will repeat echocardiogram in September he looks so good that I am afraid to touch any of his medications.  If his ejection fraction will be diminished or not improved significantly then we will try to augment his medical therapy.  The obstacle that I see is his blood pressure being low. Diabetes type 2.  He does have defibrillator pacemaker continue monitoring. Atrial fibrillation, not  anticoagulated Secondary to history of GI bleed.  Will continue monitoring.  Medication Adjustments/Labs and Tests Ordered: Current medicines are reviewed at length with the patient today.  Concerns regarding medicines are outlined above.  Orders Placed This Encounter  Procedures   EKG 12-Lead   ECHOCARDIOGRAM COMPLETE   Medication changes: No orders of the defined types were placed  in this encounter.   Signed, Lamar DOROTHA Fitch, MD, [facility] 06/28/2024 9:24 AM    [facility]

## 2024-07-02 DIAGNOSIS — L602 Onychogryphosis: Secondary | ICD-10-CM | POA: Diagnosis not present

## 2024-07-02 DIAGNOSIS — I739 Peripheral vascular disease, unspecified: Secondary | ICD-10-CM | POA: Diagnosis not present

## 2024-07-02 DIAGNOSIS — L603 Nail dystrophy: Secondary | ICD-10-CM | POA: Diagnosis not present

## 2024-07-02 DIAGNOSIS — L84 Corns and callosities: Secondary | ICD-10-CM | POA: Diagnosis not present

## 2024-07-13 DIAGNOSIS — I4891 Unspecified atrial fibrillation: Secondary | ICD-10-CM | POA: Diagnosis not present

## 2024-07-13 DIAGNOSIS — I5023 Acute on chronic systolic (congestive) heart failure: Secondary | ICD-10-CM | POA: Diagnosis not present

## 2024-07-13 DIAGNOSIS — E441 Mild protein-calorie malnutrition: Secondary | ICD-10-CM | POA: Diagnosis not present

## 2024-07-19 DIAGNOSIS — H353221 Exudative age-related macular degeneration, left eye, with active choroidal neovascularization: Secondary | ICD-10-CM | POA: Diagnosis not present

## 2024-07-24 ENCOUNTER — Ambulatory Visit: Attending: Cardiology

## 2024-07-24 DIAGNOSIS — I4819 Other persistent atrial fibrillation: Secondary | ICD-10-CM | POA: Diagnosis not present

## 2024-07-26 LAB — ECHOCARDIOGRAM COMPLETE
AR max vel: 2.43 cm2
AV Area VTI: 2.49 cm2
AV Area mean vel: 2.27 cm2
AV Mean grad: 3 mmHg
AV Peak grad: 5.4 mmHg
AV Vena cont: 0.3 cm
Ao pk vel: 1.16 m/s
Area-P 1/2: 1.94 cm2
MV M vel: 2.51 m/s
MV Peak grad: 25.2 mmHg
MV VTI: 1.7 cm2
P 1/2 time: 913 ms
S' Lateral: 3.9 cm

## 2024-07-27 ENCOUNTER — Ambulatory Visit: Payer: Self-pay | Admitting: Cardiology

## 2024-08-15 DIAGNOSIS — Z23 Encounter for immunization: Secondary | ICD-10-CM | POA: Diagnosis not present

## 2024-08-16 NOTE — Progress Notes (Addendum)
 Remote PPM Transmission

## 2024-09-03 DIAGNOSIS — L602 Onychogryphosis: Secondary | ICD-10-CM | POA: Diagnosis not present

## 2024-09-03 DIAGNOSIS — I739 Peripheral vascular disease, unspecified: Secondary | ICD-10-CM | POA: Diagnosis not present

## 2024-09-03 DIAGNOSIS — M21611 Bunion of right foot: Secondary | ICD-10-CM | POA: Diagnosis not present

## 2024-09-03 DIAGNOSIS — L97511 Non-pressure chronic ulcer of other part of right foot limited to breakdown of skin: Secondary | ICD-10-CM | POA: Diagnosis not present

## 2024-09-03 DIAGNOSIS — L603 Nail dystrophy: Secondary | ICD-10-CM | POA: Diagnosis not present

## 2024-09-03 DIAGNOSIS — L84 Corns and callosities: Secondary | ICD-10-CM | POA: Diagnosis not present

## 2024-09-10 ENCOUNTER — Ambulatory Visit (INDEPENDENT_AMBULATORY_CARE_PROVIDER_SITE_OTHER)

## 2024-09-10 DIAGNOSIS — I4819 Other persistent atrial fibrillation: Secondary | ICD-10-CM

## 2024-09-11 ENCOUNTER — Ambulatory Visit: Payer: Self-pay | Admitting: Cardiology

## 2024-09-11 LAB — CUP PACEART REMOTE DEVICE CHECK
Battery Remaining Longevity: 124 mo
Battery Voltage: 3.02 V
Brady Statistic AP VP Percent: 41.83 %
Brady Statistic AP VS Percent: 0.3 %
Brady Statistic AS VP Percent: 56.42 %
Brady Statistic AS VS Percent: 1.45 %
Brady Statistic RA Percent Paced: 42.64 %
Brady Statistic RV Percent Paced: 98.25 %
Date Time Interrogation Session: 20251026211525
Implantable Lead Connection Status: 753985
Implantable Lead Connection Status: 753985
Implantable Lead Connection Status: 753985
Implantable Lead Implant Date: 20241216
Implantable Lead Implant Date: 20241216
Implantable Lead Implant Date: 20241216
Implantable Lead Location: 753858
Implantable Lead Location: 753859
Implantable Lead Location: 753860
Implantable Lead Model: 4598
Implantable Lead Model: 5076
Implantable Lead Model: 5076
Implantable Pulse Generator Implant Date: 20241216
Lead Channel Impedance Value: 323 Ohm
Lead Channel Impedance Value: 380 Ohm
Lead Channel Impedance Value: 380 Ohm
Lead Channel Impedance Value: 399 Ohm
Lead Channel Impedance Value: 456 Ohm
Lead Channel Impedance Value: 456 Ohm
Lead Channel Impedance Value: 456 Ohm
Lead Channel Impedance Value: 589 Ohm
Lead Channel Impedance Value: 627 Ohm
Lead Channel Impedance Value: 646 Ohm
Lead Channel Impedance Value: 684 Ohm
Lead Channel Impedance Value: 703 Ohm
Lead Channel Impedance Value: 722 Ohm
Lead Channel Impedance Value: 760 Ohm
Lead Channel Pacing Threshold Amplitude: 0.75 V
Lead Channel Pacing Threshold Amplitude: 0.875 V
Lead Channel Pacing Threshold Amplitude: 1.125 V
Lead Channel Pacing Threshold Pulse Width: 0.4 ms
Lead Channel Pacing Threshold Pulse Width: 0.4 ms
Lead Channel Pacing Threshold Pulse Width: 0.4 ms
Lead Channel Sensing Intrinsic Amplitude: 1.25 mV
Lead Channel Sensing Intrinsic Amplitude: 1.25 mV
Lead Channel Sensing Intrinsic Amplitude: 13.5 mV
Lead Channel Sensing Intrinsic Amplitude: 13.5 mV
Lead Channel Setting Pacing Amplitude: 1.5 V
Lead Channel Setting Pacing Amplitude: 1.75 V
Lead Channel Setting Pacing Amplitude: 2 V
Lead Channel Setting Pacing Pulse Width: 0.4 ms
Lead Channel Setting Pacing Pulse Width: 0.4 ms
Lead Channel Setting Sensing Sensitivity: 2.8 mV
Zone Setting Status: 755011
Zone Setting Status: 755011

## 2024-09-18 NOTE — Progress Notes (Signed)
 Remote PPM Transmission

## 2024-10-30 ENCOUNTER — Encounter: Payer: Self-pay | Admitting: Cardiology

## 2024-10-30 ENCOUNTER — Ambulatory Visit: Attending: Cardiology | Admitting: Cardiology

## 2024-10-30 VITALS — BP 128/70 | HR 81 | Ht 71.0 in | Wt 158.0 lb

## 2024-10-30 DIAGNOSIS — I10 Essential (primary) hypertension: Secondary | ICD-10-CM

## 2024-10-30 DIAGNOSIS — I420 Dilated cardiomyopathy: Secondary | ICD-10-CM

## 2024-10-30 DIAGNOSIS — I4821 Permanent atrial fibrillation: Secondary | ICD-10-CM

## 2024-10-30 DIAGNOSIS — I447 Left bundle-branch block, unspecified: Secondary | ICD-10-CM

## 2024-10-30 DIAGNOSIS — I480 Paroxysmal atrial fibrillation: Secondary | ICD-10-CM

## 2024-10-30 NOTE — Patient Instructions (Addendum)
Medication Instructions:  Your physician recommends that you continue on your current medications as directed. Please refer to the Current Medication list given to you today.  *If you need a refill on your cardiac medications before your next appointment, please call your pharmacy*   Lab Work: BMP today If you have labs (blood work) drawn today and your tests are completely normal, you will receive your results only by: MyChart Message (if you have MyChart) OR A paper copy in the mail If you have any lab test that is abnormal or we need to change your treatment, we will call you to review the results.   Testing/Procedures: None Ordered   Follow-Up: At [facility], you and your health needs are our priority.  As part of our continuing mission to provide you with exceptional heart care, we have created designated Provider Care Teams.  These Care Teams include your primary Cardiologist (physician) and Advanced Practice Providers (APPs -  Physician Assistants and Nurse Practitioners) who all work together to provide you with the care you need, when you need it.  We recommend signing up for the patient portal called "MyChart".  Sign up information is provided on this After Visit Summary.  MyChart is used to connect with patients for Virtual Visits (Telemedicine).  Patients are able to view lab/test results, encounter notes, upcoming appointments, etc.  Non-urgent messages can be sent to your provider as well.   To learn more about what you can do with MyChart, go to [email or web address].    Your next appointment:   3 month(s)  The format for your next appointment:   In Person  Provider:   Robert Krasowski, MD    Other Instructions NA  

## 2024-10-30 NOTE — Progress Notes (Signed)
 Cardiology Office Note:    Date:  10/30/2024   ID:  Curtis Ramos, DOB 17-Mar-1932, MRN [id number]  PCP:  Stephanie Charlene CROME, MD  Cardiologist:  Lamar Fitch, MD    Referring MD: Stephanie Charlene CROME, MD   Chief Complaint  Patient presents with   Follow-up  Doing great  History of Present Illness:    Curtis Ramos is a 89 y.o. male past medical history significant for atrial fibrillation paroxysmal, diminished ejection fraction then interestingly ejection fraction normalized then got bad again in February of last year he required CPR was done for 9 minutes for Ancef  therapy but then recovered completely end up having ICD comes today to my office doing great.  Feeling well.  Happy cheerful joking denies have any chest pain tightness squeezing pressure burning chest.  Past Medical History:  Diagnosis Date   Acute on chronic combined systolic and diastolic CHF (congestive heart failure) (HCC) 03/19/2019   Acute respiratory failure with hypoxia (HCC) 01/13/2023   Acute urinary retention 01/27/2023   AKI (acute kidney injury) 12/26/2014   Arthralgia 11/04/2014   Arthritis    proabably   Atypical mycobacterium infection    Cancer [facility])    family unaware of this hx on 12/26/2014   Cardiac arrest (HCC) 01/13/2023   Chronic systolic heart failure (HCC)    Decreased oral intake 12/26/2014   Decreased oral intake 12/26/2014   Delirium 01/26/2023   Depressed left ventricular ejection fraction 02/05/2020   Dilated cardiomyopathy (HCC) 03/19/2019   Ejection fraction 20 to 25% based on echocardiogram done by pulmonologist month ago.   DJD (degenerative joint disease) 10/07/2014   Dyspnea on exertion 03/19/2019   Elevated liver enzymes 01/27/2023   Essential hypertension 02/05/2020   GERD (gastroesophageal reflux disease)    Hammer toes of both feet 01/30/2018   Hand pain 06/27/2024   Hematochezia 01/26/2023   HFrEF (heart failure with reduced ejection fraction) (HCC) 10/31/2023    History of thyroid  cancer 12/16/2016   HLA B27 (HLA B27 positive) 06/27/2024   Hypercalcemia 12/26/2014   Hypothyroidism    Hypothyroidism associated with surgical procedure 10/07/2014   Impaired functional mobility, balance, gait, and endurance 11/02/2023   LBBB (left bundle branch block) 10/31/2023   Lung mass    Mobitz type 2 second degree atrioventricular block 10/31/2023   Myalgia 11/04/2014   Mycobacterium avium complex (HCC)    Nail dystrophy 01/30/2018   Occult malignancy (HCC)    Oropharyngeal dysphagia 01/27/2023   PAF (paroxysmal atrial fibrillation) (HCC) 02/05/2020   Pain in limb 06/27/2024   Permanent atrial fibrillation (HCC) 10/15/2020   Persistent atrial fibrillation (HCC) 03/19/2019   Chads 2 Vascor equals 3   Polyarthralgia 11/04/2014   PONV (postoperative nausea and vomiting)    Pre-ulcerative calluses 01/30/2018   Protein-calorie malnutrition, severe 01/14/2023   Pseudoaneurysm following procedure 04/16/2020   Sensorineural hearing loss (SNHL), bilateral 08/11/2018   Septic shock (HCC) 01/13/2023   Shingles    Shoulder joint pain 11/04/2014   Toenail fungus 01/30/2018   Trigger finger of right hand 06/27/2024   VAP (ventilator-associated pneumonia) (HCC) 01/26/2023   Wears glasses     Past Surgical History:  Procedure Laterality Date   APPENDECTOMY     CATARACT EXTRACTION W/ INTRAOCULAR LENS  IMPLANT, BILATERAL Bilateral    COLONOSCOPY     INGUINAL HERNIA REPAIR Left    IR GASTROSTOMY TUBE MOD SED  02/09/2023   IR GASTROSTOMY TUBE REMOVAL  04/21/2023   LEFT HEART CATH AND  CORONARY ANGIOGRAPHY N/A 03/11/2020   Procedure: LEFT HEART CATH AND CORONARY ANGIOGRAPHY;  Surgeon: Dann Candyce RAMAN, MD;  Location: [facility] INVASIVE CV LAB;  Service: Cardiovascular;  Laterality: N/A;   MASS EXCISION Right 08/27/2014   Procedure: EXCISION MASS RIGHT PALM/INDEX;  Surgeon: Franky Curia, MD;  Location: [facility];  Service: Orthopedics;  Laterality: Right;    THYROIDECTOMY  2003    Current Medications: Active Medications[1]   Allergies:   Patient has no known allergies.   Social History   Socioeconomic History   Marital status: Married    Spouse name: Not on file   Number of children: Not on file   Years of education: Not on file   Highest education level: Not on file  Occupational History   Not on file  Tobacco Use   Smoking status: Never   Smokeless tobacco: Never  Substance and Sexual Activity   Alcohol use: No   Drug use: No   Sexual activity: Not on file  Other Topics Concern   Not on file  Social History Narrative   Not on file   Social Drivers of Health   Tobacco Use: Low Risk (10/30/2024)   Patient History    Smoking Tobacco Use: Never    Smokeless Tobacco Use: Never    Passive Exposure: Not on file  Financial Resource Strain: Low Risk (01/18/2023)   Overall Financial Resource Strain (CARDIA)    Difficulty of Paying Living Expenses: Not hard at all  Food Insecurity: Low Risk (10/31/2023)   Received from [facility]   Epic    Within the past 12 months, you worried that your food would run out before you got money to buy more: Never true    Within the past 12 months, the food you bought just didn't last and you didn't have money to get more. : Never true  Transportation Needs: No Transportation Needs (10/31/2023)   Received from [employer]    In the past 12 months, has lack of reliable transportation kept you from medical appointments, meetings, work or from getting things needed for daily living? : No  Physical Activity: Not on file  Stress: Not on file  Social Connections: Not on file  Depression (EYV7-0): Not on file  Alcohol Screen: Not on file  Housing: Low Risk (10/31/2023)   Received from [facility]   Epic    What is your living situation today?: I have a steady place to live    Think about the place you live. Do you have problems with any of the following? Choose all that  apply:: None/None on this list  Utilities: Low Risk (10/31/2023)   Received from [facility]   Utilities    In the past 12 months has the electric, gas, oil, or water  company threatened to shut off services in your home? : No  Health Literacy: Not on file     Family History: The patient's family history includes Bradycardia in his father and paternal uncle; Cancer in his maternal aunt. ROS:   Please see the history of present illness.    All 14 point review of systems negative except as described per history of present illness  EKGs/Labs/Other Studies Reviewed:    EKG Interpretation Date/Time:  Tuesday October 30 2024 10:19:26 EST Ventricular Rate:  81 PR Interval:  160 QRS Duration:  156 QT Interval:  426 QTC Calculation: 494 R Axis:   -68  Text Interpretation: Normal sinus rhythm Left axis deviation  Left ventricular hypertrophy with QRS widening ( R in aVL , Cornell product ) Possible Lateral infarct (cited on or before 28-Jun-2024) When compared with ECG of 28-Jun-2024 08:53, Premature ventricular complexes are no longer Present Right bundle branch block is no longer Present Confirmed by Bernie Charleston [phone]) on 10/30/2024 11:16:45 AM    Recent Labs: 02/20/2024: BUN 19; Creatinine, Ser 1.17; Potassium 4.4; Sodium 141  Recent Lipid Panel    Component Value Date/Time   TRIG 96 01/20/2023 0425   LDLDIRECT 80 11/20/2021 1523    Physical Exam:    VS:  BP 128/70   Pulse 81   Ht 5' 11 (1.803 m)   Wt 158 lb (71.7 kg)   SpO2 98%   BMI 22.04 kg/m     Wt Readings from Last 3 Encounters:  10/30/24 158 lb (71.7 kg)  06/28/24 157 lb 12.8 oz (71.6 kg)  02/20/24 160 lb (72.6 kg)     GEN:  Well nourished, well developed in no acute distress HEENT: Normal NECK: No JVD; No carotid bruits LYMPHATICS: No lymphadenopathy CARDIAC: RRR, no murmurs, no rubs, no gallops RESPIRATORY:  Clear to auscultation without rales, wheezing or rhonchi  ABDOMEN: Soft, non-tender,  non-distended MUSCULOSKELETAL:  No edema; No deformity  SKIN: Warm and dry LOWER EXTREMITIES: no swelling NEUROLOGIC:  Alert and oriented x 3 PSYCHIATRIC:  Normal affect   ASSESSMENT:    1. Permanent atrial fibrillation (HCC)   2. Essential hypertension   3. Dilated cardiomyopathy (HCC)   4. LBBB (left bundle branch block)   5. PAF (paroxysmal atrial fibrillation) (HCC)    PLAN:    In order of problems listed above:  Atrial fibrillation looks like he is in sinus rhythm today, reservation about anticoagulation because of his history of GI bleed and advanced age,. Dilated cardiomyopathy still diminished ejection fraction with small dosages of medication he was unable to tolerate higher doses of ARB, will check Chem-7 CBC look good.  Blood pressure is decent hopefully be able to increase the dose of that medication or maybe we can try Entresto . Essential hypertension blood pressure well-controlled   Medication Adjustments/Labs and Tests Ordered: Current medicines are reviewed at length with the patient today.  Concerns regarding medicines are outlined above.  Orders Placed This Encounter  Procedures   Basic metabolic panel with GFR   EKG 87-Ozji   Medication changes: No orders of the defined types were placed in this encounter.   Signed, [place] DOROTHA Bernie, MD, [facility] 10/30/2024 11:17 AM    [facility]     [1]  Current Meds  Medication Sig   acetaminophen  (TYLENOL ) 325 MG tablet Take 650 mg by mouth every 6 (six) hours as needed for mild pain or moderate pain.   acetaminophen  (TYLENOL ) 500 MG tablet Take 2 tablets (1,000 mg total) by mouth every 6 (six) hours as needed for mild pain, headache or fever.   amiodarone  (PACERONE ) 200 MG tablet Take 200 mg by mouth daily.   DOCUSATE SODIUM  PO Take 1 tablet by mouth daily.   feeding supplement (ENSURE ENLIVE / ENSURE PLUS) LIQD Take 237 mLs by mouth 2 (two) times daily between meals.   furosemide  (LASIX )  40 MG tablet Take 40 mg by mouth 2 (two) times daily.   hydrocortisone  (ANUSOL -HC) 2.5 % rectal cream Place 1 Application rectally 4 (four) times daily as needed for hemorrhoids or anal itching.   ketoconazole (NIZORAL) 2 % shampoo Apply 1 Application topically every evening.   LACTOBACILLUS  RHAMNOSUS, GG, PO Take 500 mg by mouth 2 (two) times daily.   lansoprazole (PREVACID) 30 MG capsule Take 30 mg by mouth daily at 12 noon.   levothyroxine  (SYNTHROID ) 150 MCG tablet Take 150 mcg by mouth daily before breakfast.   lidocaine  (LIDODERM ) 5 % Place 1 patch onto the skin daily.   losartan  (COZAAR ) 25 MG tablet Take 1 tablet (25 mg total) by mouth daily. [facility] Spoke Rosaline RN [phone]   Melatonin 3 MG TBDP Take 1 tablet by mouth at bedtime as needed (Sleep).   Multiple Vitamins-Minerals (PRESERVISION AREDS PO) Take 1 tablet by mouth daily.   traZODone (DESYREL) 50 MG tablet Take 50 mg by mouth at bedtime.   Water  For Irrigation, Sterile (FREE WATER ) SOLN Place 100 mLs into feeding tube 6 (six) times daily.

## 2024-10-31 LAB — BASIC METABOLIC PANEL WITH GFR
BUN/Creatinine Ratio: 17 (ref 10–24)
BUN: 23 mg/dL (ref 10–36)
CO2: 22 mmol/L (ref 20–29)
Calcium: 10.6 mg/dL — ABNORMAL HIGH (ref 8.6–10.2)
Chloride: 102 mmol/L (ref 96–106)
Creatinine, Ser: 1.32 mg/dL — ABNORMAL HIGH (ref 0.76–1.27)
Glucose: 91 mg/dL (ref 70–99)
Potassium: 4.8 mmol/L (ref 3.5–5.2)
Sodium: 138 mmol/L (ref 134–144)
eGFR: 51 mL/min/1.73 — ABNORMAL LOW (ref >59)

## 2024-11-02 ENCOUNTER — Ambulatory Visit: Payer: Self-pay | Admitting: Cardiology

## 2024-11-02 DIAGNOSIS — N289 Disorder of kidney and ureter, unspecified: Secondary | ICD-10-CM

## 2024-11-30 LAB — BASIC METABOLIC PANEL WITH GFR
BUN/Creatinine Ratio: 23 (ref 10–24)
BUN: 27 mg/dL (ref 10–36)
CO2: 22 mmol/L (ref 20–29)
Calcium: 10.4 mg/dL — ABNORMAL HIGH (ref 8.6–10.2)
Chloride: 103 mmol/L (ref 96–106)
Creatinine, Ser: 1.18 mg/dL (ref 0.76–1.27)
Glucose: 89 mg/dL (ref 70–99)
Potassium: 4.7 mmol/L (ref 3.5–5.2)
Sodium: 142 mmol/L (ref 134–144)
eGFR: 59 mL/min/1.73 — ABNORMAL LOW

## 2024-12-10 ENCOUNTER — Ambulatory Visit

## 2024-12-10 DIAGNOSIS — I4821 Permanent atrial fibrillation: Secondary | ICD-10-CM | POA: Diagnosis not present

## 2024-12-11 LAB — CUP PACEART REMOTE DEVICE CHECK
Battery Remaining Longevity: 123 mo
Battery Voltage: 3.02 V
Brady Statistic AP VP Percent: 40.14 %
Brady Statistic AP VS Percent: 0.13 %
Brady Statistic AS VP Percent: 58.89 %
Brady Statistic AS VS Percent: 0.84 %
Brady Statistic RA Percent Paced: 40.45 %
Brady Statistic RV Percent Paced: 99.03 %
Date Time Interrogation Session: 20260126035940
Implantable Lead Connection Status: 753985
Implantable Lead Connection Status: 753985
Implantable Lead Connection Status: 753985
Implantable Lead Implant Date: 20241216
Implantable Lead Implant Date: 20241216
Implantable Lead Implant Date: 20241216
Implantable Lead Location: 753858
Implantable Lead Location: 753859
Implantable Lead Location: 753860
Implantable Lead Model: 4598
Implantable Lead Model: 5076
Implantable Lead Model: 5076
Implantable Pulse Generator Implant Date: 20241216
Lead Channel Impedance Value: 361 Ohm
Lead Channel Impedance Value: 437 Ohm
Lead Channel Impedance Value: 456 Ohm
Lead Channel Impedance Value: 456 Ohm
Lead Channel Impedance Value: 475 Ohm
Lead Channel Impedance Value: 475 Ohm
Lead Channel Impedance Value: 513 Ohm
Lead Channel Impedance Value: 589 Ohm
Lead Channel Impedance Value: 646 Ohm
Lead Channel Impedance Value: 760 Ohm
Lead Channel Impedance Value: 779 Ohm
Lead Channel Impedance Value: 779 Ohm
Lead Channel Impedance Value: 798 Ohm
Lead Channel Impedance Value: 817 Ohm
Lead Channel Pacing Threshold Amplitude: 0.75 V
Lead Channel Pacing Threshold Amplitude: 1 V
Lead Channel Pacing Threshold Amplitude: 1.125 V
Lead Channel Pacing Threshold Pulse Width: 0.4 ms
Lead Channel Pacing Threshold Pulse Width: 0.4 ms
Lead Channel Pacing Threshold Pulse Width: 0.4 ms
Lead Channel Sensing Intrinsic Amplitude: 1.625 mV
Lead Channel Sensing Intrinsic Amplitude: 1.625 mV
Lead Channel Sensing Intrinsic Amplitude: 8.75 mV
Lead Channel Sensing Intrinsic Amplitude: 8.75 mV
Lead Channel Setting Pacing Amplitude: 1.5 V
Lead Channel Setting Pacing Amplitude: 1.75 V
Lead Channel Setting Pacing Amplitude: 2 V
Lead Channel Setting Pacing Pulse Width: 0.4 ms
Lead Channel Setting Pacing Pulse Width: 0.4 ms
Lead Channel Setting Sensing Sensitivity: 2.8 mV
Zone Setting Status: 755011
Zone Setting Status: 755011

## 2024-12-13 ENCOUNTER — Ambulatory Visit: Payer: Self-pay | Admitting: Cardiology

## 2024-12-18 NOTE — Progress Notes (Signed)
 Remote PPM Transmission

## 2025-01-31 ENCOUNTER — Ambulatory Visit: Admitting: Cardiology

## 2025-03-11 ENCOUNTER — Encounter

## 2025-06-10 ENCOUNTER — Encounter

## 2025-09-09 ENCOUNTER — Encounter

## 2025-12-09 ENCOUNTER — Encounter

## 2026-03-10 ENCOUNTER — Encounter

## 2026-06-09 ENCOUNTER — Encounter
